# Patient Record
Sex: Female | Born: 1955 | Race: Black or African American | Hispanic: No | Smoking: Former smoker
Health system: Southern US, Community
[De-identification: ages and names within clinical notes are randomized; demographics above are authoritative.]

## PROBLEM LIST (undated history)

## (undated) DIAGNOSIS — G4730 Sleep apnea, unspecified: Secondary | ICD-10-CM

## (undated) DIAGNOSIS — F419 Anxiety disorder, unspecified: Secondary | ICD-10-CM

## (undated) DIAGNOSIS — J45909 Unspecified asthma, uncomplicated: Secondary | ICD-10-CM

## (undated) DIAGNOSIS — M1990 Unspecified osteoarthritis, unspecified site: Secondary | ICD-10-CM

## (undated) DIAGNOSIS — T148XXA Other injury of unspecified body region, initial encounter: Secondary | ICD-10-CM

## (undated) DIAGNOSIS — K219 Gastro-esophageal reflux disease without esophagitis: Secondary | ICD-10-CM

## (undated) DIAGNOSIS — J449 Chronic obstructive pulmonary disease, unspecified: Secondary | ICD-10-CM

## (undated) DIAGNOSIS — I10 Essential (primary) hypertension: Secondary | ICD-10-CM

## (undated) HISTORY — PX: LAPAROSCOPIC ROUX-EN-Y GASTRIC BYPASS WITH UPPER ENDOSCOPY AND REMOVAL OF LAP BAND: SHX6505

## (undated) HISTORY — DX: Chronic obstructive pulmonary disease, unspecified: J44.9

## (undated) HISTORY — PX: COLONOSCOPY W/ BIOPSIES AND POLYPECTOMY: SHX1376

## (undated) HISTORY — PX: DILATION AND CURETTAGE OF UTERUS: SHX78

## (undated) HISTORY — PX: TUBAL LIGATION: SHX77

## (undated) HISTORY — PX: BACK SURGERY: SHX140

---

## 2015-01-18 DIAGNOSIS — Z9889 Other specified postprocedural states: Secondary | ICD-10-CM | POA: Insufficient documentation

## 2015-01-18 DIAGNOSIS — E669 Obesity, unspecified: Secondary | ICD-10-CM | POA: Insufficient documentation

## 2015-01-18 DIAGNOSIS — I10 Essential (primary) hypertension: Secondary | ICD-10-CM | POA: Insufficient documentation

## 2015-01-18 HISTORY — DX: Obesity, unspecified: E66.9

## 2015-01-18 HISTORY — DX: Other specified postprocedural states: Z98.890

## 2015-06-05 DIAGNOSIS — Z860100 Personal history of colon polyps, unspecified: Secondary | ICD-10-CM | POA: Insufficient documentation

## 2015-06-05 DIAGNOSIS — R1110 Vomiting, unspecified: Secondary | ICD-10-CM | POA: Insufficient documentation

## 2015-06-16 HISTORY — PX: BREAST BIOPSY: SHX20

## 2017-05-20 DIAGNOSIS — K529 Noninfective gastroenteritis and colitis, unspecified: Secondary | ICD-10-CM

## 2017-05-20 DIAGNOSIS — K5100 Ulcerative (chronic) pancolitis without complications: Secondary | ICD-10-CM | POA: Insufficient documentation

## 2017-05-20 HISTORY — DX: Noninfective gastroenteritis and colitis, unspecified: K52.9

## 2017-07-11 DIAGNOSIS — J45901 Unspecified asthma with (acute) exacerbation: Secondary | ICD-10-CM | POA: Insufficient documentation

## 2017-10-09 ENCOUNTER — Other Ambulatory Visit: Payer: Self-pay

## 2017-10-09 ENCOUNTER — Emergency Department (HOSPITAL_COMMUNITY): Payer: Self-pay

## 2017-10-09 ENCOUNTER — Emergency Department (HOSPITAL_COMMUNITY)
Admission: EM | Admit: 2017-10-09 | Discharge: 2017-10-09 | Disposition: A | Discharge status: Home / Self Care | Payer: Self-pay | Attending: Emergency Medicine | Admitting: Emergency Medicine

## 2017-10-09 ENCOUNTER — Encounter (HOSPITAL_COMMUNITY): Payer: Self-pay | Admitting: *Deleted

## 2017-10-09 DIAGNOSIS — J45901 Unspecified asthma with (acute) exacerbation: Secondary | ICD-10-CM | POA: Insufficient documentation

## 2017-10-09 DIAGNOSIS — I10 Essential (primary) hypertension: Secondary | ICD-10-CM | POA: Insufficient documentation

## 2017-10-09 HISTORY — DX: Unspecified asthma, uncomplicated: J45.909

## 2017-10-09 HISTORY — DX: Essential (primary) hypertension: I10

## 2017-10-09 LAB — CBC
HCT: 38 % (ref 36.0–46.0)
Hemoglobin: 12 g/dL (ref 12.0–15.0)
MCH: 26.1 pg (ref 26.0–34.0)
MCHC: 31.6 g/dL (ref 30.0–36.0)
MCV: 82.6 fL (ref 78.0–100.0)
PLATELETS: 326 10*3/uL (ref 150–400)
RBC: 4.6 MIL/uL (ref 3.87–5.11)
RDW: 14.7 % (ref 11.5–15.5)
WBC: 8.6 10*3/uL (ref 4.0–10.5)

## 2017-10-09 LAB — BASIC METABOLIC PANEL
ANION GAP: 9 (ref 5–15)
BUN: 16 mg/dL (ref 6–20)
CALCIUM: 9.2 mg/dL (ref 8.9–10.3)
CHLORIDE: 109 mmol/L (ref 101–111)
CO2: 21 mmol/L — ABNORMAL LOW (ref 22–32)
CREATININE: 0.63 mg/dL (ref 0.44–1.00)
GFR calc non Af Amer: >60 mL/min (ref >60)
Glucose, Bld: 121 mg/dL — ABNORMAL HIGH (ref 65–99)
Potassium: 3.5 mmol/L (ref 3.5–5.1)
SODIUM: 139 mmol/L (ref 135–145)

## 2017-10-09 LAB — TROPONIN I: Troponin I: <0.03 ng/mL (ref <0.03)

## 2017-10-09 MED ORDER — PREDNISONE 20 MG PO TABS
60.0000 mg | ORAL_TABLET | Freq: Once | ORAL | Status: AC
  Administered 2017-10-09: 60 mg via ORAL
  Filled 2017-10-09: qty 3

## 2017-10-09 MED ORDER — DOXYCYCLINE HYCLATE 100 MG PO CAPS: 100.0000 mg | ORAL_CAPSULE | Freq: Two times a day (BID) | ORAL | 0 refills | Status: DC

## 2017-10-09 MED ORDER — ALBUTEROL SULFATE (2.5 MG/3ML) 0.083% IN NEBU
2.5000 mg | INHALATION_SOLUTION | Freq: Once | RESPIRATORY_TRACT | Status: AC
  Administered 2017-10-09: 2.5 mg via RESPIRATORY_TRACT
  Filled 2017-10-09: qty 3

## 2017-10-09 MED ORDER — IPRATROPIUM-ALBUTEROL 0.5-2.5 (3) MG/3ML IN SOLN
3.0000 mL | Freq: Once | RESPIRATORY_TRACT | Status: AC
  Administered 2017-10-09: 3 mL via RESPIRATORY_TRACT
  Filled 2017-10-09: qty 3

## 2017-10-09 MED ORDER — PREDNISONE 50 MG PO TABS: 50.0000 mg | ORAL_TABLET | Freq: Every day | ORAL | 0 refills | Status: AC

## 2017-10-09 MED ORDER — IPRATROPIUM BROMIDE 0.02 % IN SOLN
0.5000 mg | Freq: Once | RESPIRATORY_TRACT | Status: AC
  Administered 2017-10-09: 0.5 mg via RESPIRATORY_TRACT
  Filled 2017-10-09: qty 2.5

## 2017-10-09 NOTE — ED Triage Notes (Signed)
The pt is c/o difficulty breathing all day  Worse tonight after she took 4 hhn at the house it has not helped.  She does not live here she is just visiting some nausea and dizziness

## 2017-10-09 NOTE — ED Provider Notes (Signed)
[facility] Provider Note   CSN: [id number] Arrival date & time: 10/09/17  0353     History   Chief Complaint Chief Complaint  Patient presents with  . Shortness of Breath    HPI Patty Howard is a 62 y.o. female with a hx of asthma and hypertension who presents to the ED for intermittent dyspnea and cough over the past 1 week which was worse this evening. Patient states she has had a productive cough with clear to white colored sputum. She states she has had intermittent dyspnea with wheezing as well. She feels she has been decreasing her usual activity level due to these symptoms. She has also had some pain to the left mid back as well that seems to be aggravated with coughing- does not recall an injury to this area. She relays that this morning she woke up at 0100 with dyspnea/wheezing/chest tightness and chest congestion. She feels this was worse than previous over the past 1 week. She tried 4 puffs of her inhaler and utilized her nebulizer machine x 5, took a mucinex and did not seem to have much relief in her symptoms prompting her ER visit. No other specific alleviating/aggravating factors. Reports associated subjective fever last night.  She states this feel similar to previous problems with her asthma that have improved with steroids and abx. She has required admissions for asthma in the past, but does not feel that she is at that point.  Denies chest pain, leg pain/swelling, hemoptysis, recent surgery/trauma, recent long travel, hormone use, personal hx of cancer, or hx of DVT/PE. Triage note reports nausea and dizziness- patient denies each of these symptoms to me. Patient states she has some wheezing at baseline.    HPI  Past Medical History:  Diagnosis Date  . Asthma   . Hypertension     There are no active problems to display for this patient.   History reviewed. No pertinent surgical history.   OB History   None      Home Medications    Prior to Admission medications   Not on File    Family History History reviewed. No pertinent family history.  Social History Social History   Tobacco Use  . Smoking status: Never Smoker  . Smokeless tobacco: Never Used  Substance Use Topics  . Alcohol use: Not on file  . Drug use: Not on file     Allergies   Patient has no known allergies.   Review of Systems Review of Systems  Constitutional: Positive for fever. Negative for chills.  HENT: Negative for ear pain and sore throat.   Respiratory: Positive for cough, chest tightness and shortness of breath.   Cardiovascular: Negative for chest pain, palpitations and leg swelling.  Gastrointestinal: Negative for abdominal pain, diarrhea, nausea and vomiting.  Musculoskeletal: Positive for back pain (left mid posterior rib area). Negative for myalgias (legs).  Neurological: Negative for dizziness and syncope.  All other systems reviewed and are negative.    Physical Exam Updated Vital Signs BP 133/74   Pulse 100   Resp 17   Ht 5' 10"  (1.778 m)   Wt 78.9 kg (174 lb)   SpO2 98%   BMI 24.97 kg/m   Physical Exam  Constitutional: She appears well-developed and well-nourished.  Non-toxic appearance. No distress.  HENT:  Head: Normocephalic and atraumatic.  Right Ear: Tympanic membrane is not perforated, not erythematous, not retracted and not bulging.  Left Ear: Tympanic membrane is not perforated,  not erythematous, not retracted and not bulging.  Nose: Mucosal edema present.  Mouth/Throat: Uvula is midline and oropharynx is clear and moist. No oropharyngeal exudate or posterior oropharyngeal erythema.  Eyes: Pupils are equal, round, and reactive to light. Conjunctivae are normal. Right eye exhibits no discharge. Left eye exhibits no discharge.  Neck: Normal range of motion. Neck supple.  Cardiovascular: Normal rate and regular rhythm.  No murmur heard. Pulmonary/Chest: Effort normal. No  respiratory distress. She has wheezes (end expiratory throughout). She has no rales. She exhibits tenderness (Left mid posterolateral ribs). She exhibits no crepitus, no edema, no deformity, no swelling and no retraction.  No overlying ecchymosis.  Abdominal: Soft. She exhibits no distension. There is no tenderness.  Musculoskeletal:       Right lower leg: She exhibits no tenderness and no edema.       Left lower leg: She exhibits no tenderness and no edema.  Lymphadenopathy:    She has no cervical adenopathy.  Neurological: She is alert.  Skin: Skin is warm and dry. No rash noted.  Psychiatric: She has a normal mood and affect. Her behavior is normal.  Nursing note and vitals reviewed.    ED Treatments / Results  Labs Results for orders placed or performed during the hospital encounter of 64/15/83  Basic metabolic panel  Result Value Ref Range   Sodium 139 135 - 145 mmol/L   Potassium 3.5 3.5 - 5.1 mmol/L   Chloride 109 101 - 111 mmol/L   CO2 21 (L) 22 - 32 mmol/L   Glucose, Bld 121 (H) 65 - 99 mg/dL   BUN 16 6 - 20 mg/dL   Creatinine, Ser 0.63 0.44 - 1.00 mg/dL   Calcium 9.2 8.9 - 10.3 mg/dL   GFR calc non Af Amer >60 >60 mL/min   GFR calc Af Amer >60 >60 mL/min   Anion gap 9 5 - 15  CBC  Result Value Ref Range   WBC 8.6 4.0 - 10.5 K/uL   RBC 4.60 3.87 - 5.11 MIL/uL   Hemoglobin 12.0 12.0 - 15.0 g/dL   HCT 38.0 36.0 - 46.0 %   MCV 82.6 78.0 - 100.0 fL   MCH 26.1 26.0 - 34.0 pg   MCHC 31.6 30.0 - 36.0 g/dL   RDW 14.7 11.5 - 15.5 %   Platelets 326 150 - 400 K/uL  Troponin I  Result Value Ref Range   Troponin I <0.03 <0.03 ng/mL   Dg Chest 2 View  Result Date: 10/09/2017 CLINICAL DATA:  Initial evaluation for acute shortness of breath. History of asthma. EXAM: CHEST - 2 VIEW COMPARISON:  None. FINDINGS: Cardiac and mediastinal silhouettes within normal limits. Aortic atherosclerosis. Lungs somewhat hyperinflated with flattening of the hemidiaphragms. Diffuse  peribronchial thickening, likely related history of asthma, although bronchiolitis could also have this appearance. No focal infiltrates. No pulmonary edema or pleural effusion. No pneumothorax. Age-indeterminate fractures involving the left fourth and sixth ribs noted. No other acute osseous abnormality. IMPRESSION: 1. Scattered diffuse peribronchial thickening, likely related to history of asthma, although acute bronchiolitis could also have this appearance. No focal infiltrates to suggest pneumonia. 2. Age indeterminate fractures of the left posterior fourth and sixth ribs, favored to be chronic. Correlation with physical exam and history recommended. Electronically Signed   By: Jeannine Boga M.D.   On: 10/09/2017 05:32   EKG EKG Interpretation  Date/Time:  Saturday October 09 2017 03:57:06 EDT Ventricular Rate:  112 PR Interval:  130 QRS Duration: 86  QT Interval:  354 QTC Calculation: 483 R Axis:   64 Text Interpretation:  Sinus tachycardia Nonspecific ST and T wave abnormality Abnormal ECG No old tracing to compare Confirmed by Delora Fuel ([id number]) on 10/09/2017 6:24:40 AM   Radiology Dg Chest 2 View  Result Date: 10/09/2017 CLINICAL DATA:  Initial evaluation for acute shortness of breath. History of asthma. EXAM: CHEST - 2 VIEW COMPARISON:  None. FINDINGS: Cardiac and mediastinal silhouettes within normal limits. Aortic atherosclerosis. Lungs somewhat hyperinflated with flattening of the hemidiaphragms. Diffuse peribronchial thickening, likely related history of asthma, although bronchiolitis could also have this appearance. No focal infiltrates. No pulmonary edema or pleural effusion. No pneumothorax. Age-indeterminate fractures involving the left fourth and sixth ribs noted. No other acute osseous abnormality. IMPRESSION: 1. Scattered diffuse peribronchial thickening, likely related to history of asthma, although acute bronchiolitis could also have this appearance. No focal infiltrates  to suggest pneumonia. 2. Age indeterminate fractures of the left posterior fourth and sixth ribs, favored to be chronic. Correlation with physical exam and history recommended. Electronically Signed   By: Jeannine Boga M.D.   On: 10/09/2017 05:32    Procedures Procedures (including critical care time)  Medications Ordered in ED Medications  albuterol (PROVENTIL) (2.5 MG/3ML) 0.083% nebulizer solution 2.5 mg (2.5 mg Nebulization Given 10/09/17 0414)  ipratropium (ATROVENT) nebulizer solution 0.5 mg (0.5 mg Nebulization Given 10/09/17 0414)     Initial Impression / Assessment and Plan / ED Course  I have reviewed the triage vital signs and the nursing notes.  Pertinent labs & imaging results that were available during my care of the patient were reviewed by me and considered in my medical decision making (see chart for details).   Patient presents with dyspnea/cough/wheezing which she reports is consistent with previous problems with asthma.  Patient is nontoxic-appearing, no apparent distress, initial vitals notable for mild tachycardia and hypertension-normalized at time of my initial evaluation.  Patient has had multiple breathing treatments at home, suspect prior albuterol may be contributing to her borderline HR. she remains with end expiratory wheezing throughout.  She does not appear to be in respiratory distress.  Work-up per triage reviewed.  No leukocytosis.  No anemia.  Troponin WNL. No significant electrolyte abnormalities. CXR with findings of asthma vs. Acute bronchiolitis, she has left posterior 4th/6th rib fx that are favored to be chronic- she states she has had some pain consistent with this area over the past 1 week with coughing however she has had pain in this area previously with prior ER visits on chart reviewe therefore somewhat favor chronic however difficulty to definitively determine. She does not recall a sharp specific pain or any injuries. Troponin WNL after 1 week  of sxs, >3 hours of acute worsened dyspnea, no obvious ischemia on EKG, per care everywhere chart review she has had similar interpretations in the past, patient is without chest pain, sxs similar to previous asthma, doubt ACS. She is without leg pain/swelling, hemoptysis, recent surgery/trauma, recent long travel, hormone use, personal hx of cancer, or hx of DVT/PE, doubt pulmonary embolism. Given patient remains with end expiratory wheeze will administer additional DuoNeb and 60 mg PO prednisone.   07:35: RE-EVAL: Patient feeling much improved after duoneb and prednisone. She states her chest feels as if it is opened up and she is moving air much better, reports she feels as though she is having her baseline wheezing. She is adamantly requesting to go home. Repeat lung exam with end expiratory wheeze that is  improved. Patient ambulatory with pulse-ox > 95%.   Patient appears hemodynamically stable and safe for DC home. Will prescribe steroid burst and doxycycline for her asthma exacerbation. I discussed results, treatment plan, need for PCP follow-up, and return precautions with the patient. Provided opportunity for questions, patient confirmed understanding and is in agreement with plan.   Findings and plan of care discussed with supervising physician Dr. Roxanne Mins who is in agreement.   Vitals:   10/09/17 0630 10/09/17 0730  BP: 108/72 117/65  Pulse: 99 96  Resp:  15  SpO2: 99% 94%    Final Clinical Impressions(s) / ED Diagnoses   Final diagnoses:  Exacerbation of asthma, unspecified asthma severity, unspecified whether persistent    ED Discharge Orders        Ordered    predniSONE (DELTASONE) 50 MG tablet  Daily     10/09/17 0747    doxycycline (VIBRAMYCIN) 100 MG capsule  2 times daily     10/09/17 0747       Coulter Oldaker, Glynda Jaeger, PA-C 48/54/62 [id number]    Delora Fuel, MD 00/93/81 [id number]

## 2017-10-09 NOTE — Discharge Instructions (Addendum)
You were seen in the emergency department today for an exacerbation of your asthma.  Your chest x-ray showed findings consistent with your asthma.  Your blood work was reassuring here.  You were given steroids and breathing treatments in the emergency department with improvement.  We are giving a prescription for prednisone (steroid that helps with inflammation) and doxycycline (an antibiotic to treat possible infection). Please take all of your antibiotics until finished. You may develop abdominal discomfort or diarrhea from the antibiotic.  You may help offset this with probiotics which you can buy at the store (ask your pharmacist if unable to find) or get probiotics in the form of eating yogurt. Do not eat or take the probiotics until 2 hours after your antibiotic. If you are unable to tolerate these side effects follow-up with your primary care provider or return to the emergency department.   If you begin to experience any blistering, rashes, swelling, or difficulty breathing seek medical care for evaluation of potentially more serious side effects.   Please be aware that this medication may interact with other medications you are taking, please be sure to discuss your medication list with your pharmacist.   Continue to use her inhalers as prescribed.  Follow up with your primary care divider in 2 to 3 days for reevaluation of your symptoms.  Return to the ER for any new or worsening symptoms including but not limited to worsening trouble breathing, chest pain, lightheadedness, dizziness, passing out, fever, or any other concerns.

## 2017-10-09 NOTE — ED Notes (Signed)
Patient verbalized understanding of discharge instructions and denies any further needs or questions at this time. VS stable. Patient ambulatory with steady gait.  

## 2017-10-12 DIAGNOSIS — R0600 Dyspnea, unspecified: Secondary | ICD-10-CM | POA: Insufficient documentation

## 2017-10-12 HISTORY — DX: Dyspnea, unspecified: R06.00

## 2018-04-05 DIAGNOSIS — J441 Chronic obstructive pulmonary disease with (acute) exacerbation: Secondary | ICD-10-CM | POA: Insufficient documentation

## 2018-05-29 DIAGNOSIS — J45909 Unspecified asthma, uncomplicated: Secondary | ICD-10-CM | POA: Insufficient documentation

## 2018-10-15 ENCOUNTER — Emergency Department (HOSPITAL_COMMUNITY)
Admission: EM | Admit: 2018-10-15 | Discharge: 2018-10-15 | Disposition: A | Discharge status: Home / Self Care | Payer: Medicare Other | Attending: Emergency Medicine | Admitting: Emergency Medicine

## 2018-10-15 ENCOUNTER — Other Ambulatory Visit: Payer: Self-pay

## 2018-10-15 ENCOUNTER — Encounter (HOSPITAL_COMMUNITY): Payer: Self-pay

## 2018-10-15 ENCOUNTER — Emergency Department (HOSPITAL_COMMUNITY): Payer: Medicare Other

## 2018-10-15 DIAGNOSIS — R0602 Shortness of breath: Secondary | ICD-10-CM | POA: Diagnosis present

## 2018-10-15 DIAGNOSIS — I10 Essential (primary) hypertension: Secondary | ICD-10-CM | POA: Insufficient documentation

## 2018-10-15 DIAGNOSIS — Z79899 Other long term (current) drug therapy: Secondary | ICD-10-CM | POA: Insufficient documentation

## 2018-10-15 DIAGNOSIS — J4540 Moderate persistent asthma, uncomplicated: Secondary | ICD-10-CM

## 2018-10-15 LAB — CBC WITH DIFFERENTIAL/PLATELET
Abs Immature Granulocytes: 0.02 10*3/uL (ref 0.00–0.07)
Basophils Absolute: 0 10*3/uL (ref 0.0–0.1)
Basophils Relative: 0 %
Eosinophils Absolute: 0.2 10*3/uL (ref 0.0–0.5)
Eosinophils Relative: 3 %
HCT: 37.1 % (ref 36.0–46.0)
Hemoglobin: 11.1 g/dL — ABNORMAL LOW (ref 12.0–15.0)
Immature Granulocytes: 0 %
Lymphocytes Relative: 35 %
Lymphs Abs: 2.5 10*3/uL (ref 0.7–4.0)
MCH: 26 pg (ref 26.0–34.0)
MCHC: 29.9 g/dL — ABNORMAL LOW (ref 30.0–36.0)
MCV: 86.9 fL (ref 80.0–100.0)
Monocytes Absolute: 0.4 10*3/uL (ref 0.1–1.0)
Monocytes Relative: 5 %
Neutro Abs: 4.1 10*3/uL (ref 1.7–7.7)
Neutrophils Relative %: 57 %
Platelets: 282 10*3/uL (ref 150–400)
RBC: 4.27 MIL/uL (ref 3.87–5.11)
RDW: 14.1 % (ref 11.5–15.5)
WBC: 7.2 10*3/uL (ref 4.0–10.5)
nRBC: 0 % (ref 0.0–0.2)

## 2018-10-15 LAB — URINALYSIS, ROUTINE W REFLEX MICROSCOPIC
Bilirubin Urine: NEGATIVE
Glucose, UA: NEGATIVE mg/dL
Hgb urine dipstick: NEGATIVE
Ketones, ur: 5 mg/dL — AB
Nitrite: NEGATIVE
Protein, ur: NEGATIVE mg/dL
Specific Gravity, Urine: 1.028 (ref 1.005–1.030)
pH: 5 (ref 5.0–8.0)

## 2018-10-15 LAB — COMPREHENSIVE METABOLIC PANEL
ALT: 29 U/L (ref 0–44)
AST: 20 U/L (ref 15–41)
Albumin: 4.2 g/dL (ref 3.5–5.0)
Alkaline Phosphatase: 96 U/L (ref 38–126)
Anion gap: 9 (ref 5–15)
BUN: 18 mg/dL (ref 8–23)
CO2: 25 mmol/L (ref 22–32)
Calcium: 9.6 mg/dL (ref 8.9–10.3)
Chloride: 108 mmol/L (ref 98–111)
Creatinine, Ser: 0.72 mg/dL (ref 0.44–1.00)
GFR calc Af Amer: >60 mL/min (ref >60)
GFR calc non Af Amer: >60 mL/min (ref >60)
Glucose, Bld: 95 mg/dL (ref 70–99)
Potassium: 4.1 mmol/L (ref 3.5–5.1)
Sodium: 142 mmol/L (ref 135–145)
Total Bilirubin: 0.6 mg/dL (ref 0.3–1.2)
Total Protein: 7.3 g/dL (ref 6.5–8.1)

## 2018-10-15 LAB — I-STAT TROPONIN, ED: Troponin i, poc: 0.02 ng/mL (ref 0.00–0.08)

## 2018-10-15 LAB — BRAIN NATRIURETIC PEPTIDE: B Natriuretic Peptide: 38.1 pg/mL (ref 0.0–100.0)

## 2018-10-15 MED ORDER — PREDNISONE 20 MG PO TABS
60.0000 mg | ORAL_TABLET | Freq: Once | ORAL | Status: AC
  Administered 2018-10-15: 60 mg via ORAL
  Filled 2018-10-15: qty 3

## 2018-10-15 MED ORDER — ALBUTEROL SULFATE HFA 108 (90 BASE) MCG/ACT IN AERS
4.0000 | INHALATION_SPRAY | Freq: Once | RESPIRATORY_TRACT | Status: AC
  Administered 2018-10-15: 4 via RESPIRATORY_TRACT
  Filled 2018-10-15: qty 6.7

## 2018-10-15 MED ORDER — IPRATROPIUM-ALBUTEROL 0.5-2.5 (3) MG/3ML IN SOLN: 5.0000 mL | Freq: Once | RESPIRATORY_TRACT | Status: DC

## 2018-10-15 MED ORDER — ALBUTEROL SULFATE HFA 108 (90 BASE) MCG/ACT IN AERS
4.0000 | INHALATION_SPRAY | Freq: Once | RESPIRATORY_TRACT | Status: AC
Start: 2018-10-15 — End: 2018-10-15
  Administered 2018-10-15: 4 via RESPIRATORY_TRACT
  Filled 2018-10-15: qty 6.7

## 2018-10-15 MED ORDER — PREDNISONE 10 MG PO TABS: ORAL_TABLET | ORAL | 0 refills | Status: AC

## 2018-10-15 MED ORDER — IPRATROPIUM BROMIDE HFA 17 MCG/ACT IN AERS: 3.0000 | INHALATION_SPRAY | Freq: Once | RESPIRATORY_TRACT | Status: DC

## 2018-10-15 NOTE — ED Notes (Signed)
Bed: OA55 Expected date:  Expected time:  Means of arrival:  Comments: Female AMS

## 2018-10-15 NOTE — ED Provider Notes (Signed)
63 year old female received at sign out from [place] pending medication, ambulation on pulse ox and reassessment.    "63 y/o female with a PMH Asthma, HTN presents to the ED with a chief complaint of shortness of breath x 3 days. Patient reports a productive cough with yellow to green sputum. Reports her shortness of breath is worse with walking and movement. Patient has used her nebulizer 5 x today which she reports has been a max for her without significant improvement in symptoms. She has also tried her inhaler without improvement.  She reports completing a taper of steroids about a week ago.  Patient reports for the past 2 weeks she has been spending more time at home, has not really gone out shopping.  She does endorse a previous history of smoking cigarettes but reports quitting 3 to 5 years ago. She denies any previous history of blood clots, chest pain, swelling to her legs or weight gain."    Physical Exam  BP (!) 146/83 (BP Location: Right Arm)   Pulse 100   Temp 99.9 F (37.7 C) (Oral)   Resp 18   Ht 5' 10"  (1.778 m)   Wt 131.1 kg   SpO2 99%   BMI 41.47 kg/m   Physical Exam Vitals signs and nursing note reviewed.  Constitutional:      General: She is not in acute distress.    Appearance: She is obese. She is not ill-appearing, toxic-appearing or diaphoretic.     Interventions: She is not intubated. HENT:     Head: Normocephalic.  Eyes:     Conjunctiva/sclera: Conjunctivae normal.  Neck:     Musculoskeletal: Neck supple.  Cardiovascular:     Rate and Rhythm: Normal rate and regular rhythm.     Heart sounds: No murmur. No friction rub. No gallop.   Pulmonary:     Effort: Pulmonary effort is normal. No tachypnea, bradypnea, accessory muscle usage or respiratory distress. She is not intubated.     Breath sounds: Wheezing present. No decreased breath sounds, rhonchi or rales.     Comments: Mild end expiratory wheezes in all fields.  No increased work of breathing or  tachypnea.  Good air movement bilaterally.  No rhonchi or rales. Abdominal:     General: There is no distension.     Palpations: Abdomen is soft.  Skin:    General: Skin is warm.     Findings: No rash.  Neurological:     Mental Status: She is alert.  Psychiatric:        Behavior: Behavior normal.     ED Course/Procedures     Procedures  MDM   63 year old female with a history of asthma and hypertension received a signout from [place] pending another round of albuterol, ambulation pulse ox, and likely discharge to home.  Please see her note for further work-up and medical decision making.  She is hemodynamically stable.  SaO2 is 99%.  She has no tachypnea.  On my exam, she has end expiratory wheezes bilaterally in all fields, but air movement is good.  No increased work of breathing.  She reports that she is feeling much better and would like to be discharged.  I recommended ambulating her on pulse ox, but she states " my oxygen is good and ready to go.  I know when I should return to the ER.  I know you guys are if you need to come back."  She is not in any respiratory distress.  Prednisone  taper was sent to pharmacy by [place].  She is hemodynamically stable and in no acute distress.  Safe discharge home with outpatient follow-up at this time.    Joanne Gavel, PA-C 10/15/18 [id number]    Quintella Reichert, MD 10/17/18 [phone]

## 2018-10-15 NOTE — ED Provider Notes (Signed)
[facility] DEPT Provider Note   CSN: [id number] Arrival date & time: 10/15/18  1927    History   Chief Complaint Chief Complaint  Patient presents with  . Shortness of Breath    HPI Patty Howard is a 63 y.o. female.     63 y/o female with a PMH Asthma, HTN presents to the ED with a chief complaint of shortness of breath x 3 days. Patient reports a productive cough with yellow to green sputum. Reports her shortness of breath is worse with walking and movement. Patient has used her nebulizer 5 x today which she reports has been a max for her without significant improvement in symptoms. She has also tried her inhaler without improvement.  She reports completing a taper of steroids about a week ago.  Patient reports for the past 2 weeks she has been spending more time at home, has not really gone out shopping.  She does endorse a previous history of smoking cigarettes but reports quitting 3 to 5 years ago. She denies any previous history of blood clots, chest pain, swelling to her legs or weight gain.      Past Medical History:  Diagnosis Date  . Asthma   . Hypertension     There are no active problems to display for this patient.   History reviewed. No pertinent surgical history.   OB History   No obstetric history on file.      Home Medications    Prior to Admission medications   Medication Sig Start Date End Date Taking? Authorizing Provider  albuterol (PROVENTIL HFA;VENTOLIN HFA) 108 (90 Base) MCG/ACT inhaler Inhale 1-2 puffs into the lungs every 6 (six) hours as needed for wheezing or shortness of breath.   Yes [provider]  fluticasone furoate-vilanterol (BREO ELLIPTA) 200-25 MCG/INH AEPB Inhale 1 puff into the lungs daily.   Yes [provider]  ipratropium-albuterol (DUONEB) 0.5-2.5 (3) MG/3ML SOLN Take 3 mLs by nebulization every 6 (six) hours as needed (sob and wheezing).   Yes [provider]   losartan-hydrochlorothiazide (HYZAAR) 100-25 MG tablet Take 1 tablet by mouth daily.   Yes [provider]  traMADol (ULTRAM) 50 MG tablet Take 50 mg by mouth every 6 (six) hours as needed for moderate pain.   Yes [provider]  umeclidinium bromide (INCRUSE ELLIPTA) 62.5 MCG/INH AEPB Inhale 1 puff into the lungs daily.   Yes [provider]  zolpidem (AMBIEN) 10 MG tablet Take 10 mg by mouth at bedtime as needed for sleep.   Yes [provider]  doxycycline (VIBRAMYCIN) 100 MG capsule Take 1 capsule (100 mg total) by mouth 2 (two) times daily. Patient not taking: Reported on 10/15/2018 10/09/17   Petrucelli, Glynda Jaeger, PA-C  predniSONE (DELTASONE) 10 MG tablet Take 4 tablets (40 mg total) by mouth daily for 3 days, THEN 3 tablets (30 mg total) daily for 3 days, THEN 2 tablets (20 mg total) daily for 3 days, THEN 1 tablet (10 mg total) daily for 3 days. 10/15/18 10/27/18  Janeece Fitting, PA-C    Family History History reviewed. No pertinent family history.  Social History Social History   Tobacco Use  . Smoking status: Never Smoker  . Smokeless tobacco: Never Used  Substance Use Topics  . Alcohol use: Never    Frequency: Never  . Drug use: Never     Allergies   Banana   Review of Systems Review of Systems  Constitutional: Negative for chills and  fever.  HENT: Negative for ear pain and sore throat.   Eyes: Negative for pain and visual disturbance.  Respiratory: Positive for cough (productive) and shortness of breath.   Cardiovascular: Negative for chest pain and palpitations.  Gastrointestinal: Negative for abdominal pain and vomiting.  Genitourinary: Negative for dysuria and hematuria.  Musculoskeletal: Negative for arthralgias and back pain.  Skin: Negative for color change and rash.  Neurological: Negative for seizures and syncope.  All other systems reviewed and are negative.    Physical Exam Updated Vital Signs BP (!) 146/83 (BP  Location: Right Arm)   Pulse 100   Temp 99.9 F (37.7 C) (Oral)   Resp 18   Ht 5' 10"  (1.778 m)   Wt 131.1 kg   SpO2 99%   BMI 41.47 kg/m   Physical Exam Vitals signs and nursing note reviewed.  Constitutional:      General: She is not in acute distress.    Appearance: She is well-developed.     Comments: Well appearing.   HENT:     Head: Normocephalic and atraumatic.     Mouth/Throat:     Pharynx: No oropharyngeal exudate.  Eyes:     Pupils: Pupils are equal, round, and reactive to light.  Neck:     Musculoskeletal: Normal range of motion.  Cardiovascular:     Rate and Rhythm: Regular rhythm. Tachycardia present.     Heart sounds: Normal heart sounds.  Pulmonary:     Effort: Pulmonary effort is normal. No respiratory distress.     Breath sounds: Examination of the right-upper field reveals decreased breath sounds. Examination of the right-middle field reveals decreased breath sounds. Examination of the right-lower field reveals decreased breath sounds. Examination of the left-lower field reveals decreased breath sounds. Decreased breath sounds and wheezing present. No rhonchi or rales.  Abdominal:     General: Bowel sounds are normal. There is no distension.     Palpations: Abdomen is soft.     Tenderness: There is no abdominal tenderness. There is no right CVA tenderness or left CVA tenderness.  Musculoskeletal:        General: No tenderness or deformity.     Right lower leg: No edema.     Left lower leg: No edema.  Skin:    General: Skin is warm and dry.  Neurological:     Mental Status: She is alert and oriented to person, place, and time.      ED Treatments / Results  Labs (all labs ordered are listed, but only abnormal results are displayed) Labs Reviewed  CBC WITH DIFFERENTIAL/PLATELET - Abnormal; Notable for the following components:      Result Value   Hemoglobin 11.1 (*)    MCHC 29.9 (*)    All other components within normal limits  COMPREHENSIVE  METABOLIC PANEL  BRAIN NATRIURETIC PEPTIDE  URINALYSIS, ROUTINE W REFLEX MICROSCOPIC  I-STAT TROPONIN, ED    EKG EKG Interpretation  Date/Time:  Saturday Oct 15 2018 19:58:07 EDT Ventricular Rate:  101 PR Interval:    QRS Duration: 91 QT Interval:  358 QTC Calculation: 464 R Axis:   74 Text Interpretation:  Sinus tachycardia Probable left atrial enlargement Confirmed by Quintella Reichert [phone]) on 10/15/2018 8:06:55 PM   Radiology Dg Chest Portable 1 View  Result Date: 10/15/2018 CLINICAL DATA:  Shortness of breath for 3 days at home, coughing up yellowish secretions, history asthma, hypertension EXAM: PORTABLE CHEST 1 VIEW COMPARISON:  Portable exam 1931 hours compared to 10/09/2017 FINDINGS:  Upper normal heart size. Mediastinal contours and pulmonary vascularity normal. Lungs clear. No pulmonary infiltrate, pleural effusion or pneumothorax. Bones demineralized. IMPRESSION: No acute abnormalities. Electronically Signed   By: Lavonia Dana M.D.   On: 10/15/2018 20:15    Procedures Procedures (including critical care time)  Medications Ordered in ED Medications  ipratropium (ATROVENT HFA) inhaler 3 puff (has no administration in time range)  albuterol (VENTOLIN HFA) 108 (90 Base) MCG/ACT inhaler 4 puff (has no administration in time range)  predniSONE (DELTASONE) tablet 60 mg (60 mg Oral Given 10/15/18 2115)  albuterol (VENTOLIN HFA) 108 (90 Base) MCG/ACT inhaler 4 puff (4 puffs Inhalation Given 10/15/18 2115)     Initial Impression / Assessment and Plan / ED Course  I have reviewed the triage vital signs and the nursing notes.  Pertinent labs & imaging results that were available during my care of the patient were reviewed by me and considered in my medical decision making (see chart for details).    With a previous medical history of asthma presents the ED for increased shortness of breath, has had no relief with her inhaler, nebulizer at home.  She reports having 5 nebulizing  treatments while at home.  No relieving symptoms.  During evaluation patient has wheezing on auscultation, heart rate elevated reports doing nebulizer treatment prior to arrival.  CBC showed no leukocytosis, hemoglobin is slightly decreased, assistant with patient's previous visits.  CMP showed no electrolyte abnormality, creatinine level is unremarkable.  LFTs are within normal limits.  BNP obtained to further evaluate her shortness of breath, this was negative.  Low suspicion for any CHF.  First troponin was negative, EKG shows some sinus tachycardia but no changes consistent with infarct or STEMI.  Patient has received albuterol while in the ED for puffs, Deltasone 60 mg.  Chest x-ray showed no consolidation, pneumothorax, pleural effusion.  Upon reassessment patient continues to wheeze, she does have persistent asthma will attempt a second round of albuterol along with a troponin to help with her symptoms.  Patient reports completing a steroid taper about a week ago along with antibiotics.  I will discharge patient on a short taper of steroids, we will not be repeating antibiotics due to resistance.  Patient has been educated on her treatment.  According to records patient recently completed a course of doxycycline, her chest x-ray is also clear, do not feel is appropriate for her to help antibiotics at this time, she understands and agrees with this.  10:04 PM Patient pending UA, reevaluation for improvement of wheezing, will sign out to oncoming PA. Plan is for patient to be Dispo home for improvement of symptoms.    Final Clinical Impressions(s) / ED Diagnoses   Final diagnoses:  Moderate persistent asthma without complication    ED Discharge Orders         Ordered    predniSONE (DELTASONE) 10 MG tablet     10/15/18 2202           Janeece Fitting, PA-C 10/15/18 2205    Quintella Reichert, MD 10/17/18 [phone]

## 2018-10-15 NOTE — ED Triage Notes (Signed)
Shortness of breath x 3 days at home and coughing up yellowish secretions no fever noted used inhaler and breathing treatments at home.

## 2018-10-15 NOTE — Discharge Instructions (Addendum)
Your xray today was clear, I have prescribed a steroid taper please take this as directed. If you experience any fever, chest pain, please return to the ED.

## 2018-10-19 ENCOUNTER — Encounter: Payer: Self-pay | Admitting: Family Medicine

## 2018-10-19 ENCOUNTER — Other Ambulatory Visit: Payer: Self-pay

## 2018-10-19 ENCOUNTER — Ambulatory Visit (INDEPENDENT_AMBULATORY_CARE_PROVIDER_SITE_OTHER): Payer: Medicare Other | Admitting: Family Medicine

## 2018-10-19 VITALS — HR 64 | Resp 16

## 2018-10-19 DIAGNOSIS — I10 Essential (primary) hypertension: Secondary | ICD-10-CM

## 2018-10-19 DIAGNOSIS — J449 Chronic obstructive pulmonary disease, unspecified: Secondary | ICD-10-CM | POA: Diagnosis not present

## 2018-10-19 DIAGNOSIS — G4700 Insomnia, unspecified: Secondary | ICD-10-CM | POA: Insufficient documentation

## 2018-10-19 DIAGNOSIS — M25522 Pain in left elbow: Secondary | ICD-10-CM

## 2018-10-19 DIAGNOSIS — G8929 Other chronic pain: Secondary | ICD-10-CM

## 2018-10-19 DIAGNOSIS — R7303 Prediabetes: Secondary | ICD-10-CM | POA: Insufficient documentation

## 2018-10-19 HISTORY — DX: Insomnia, unspecified: G47.00

## 2018-10-19 HISTORY — DX: Other chronic pain: G89.29

## 2018-10-19 MED ORDER — ZOLPIDEM TARTRATE 5 MG PO TABS: 5.0000 mg | ORAL_TABLET | Freq: Every evening | ORAL | 1 refills | Status: DC | PRN

## 2018-10-19 NOTE — Assessment & Plan Note (Signed)
Problems are not well controlled. Complete prednisone taper. No changes in rest of her inhalers. Caution with frequent use of albuterol nebulizations, side effects discussed. Plain Mucinex recommended. Instructed about warning signs. Pulmonology referral placed.

## 2018-10-19 NOTE — Progress Notes (Signed)
Virtual Visit via Video Note   I connected with [name] on 10/19/18 at 10:00 AM EDT by a video enabled telemedicine application and verified that I am speaking with the correct person using two identifiers.  Location patient: home Location provider:home office Persons participating in the virtual visit: patient, provider  I discussed the limitations of evaluation and management by telemedicine and the availability of in person appointments. The patient expressed understanding and agreed to proceed.   HPI: She is a 63 yo AA female ,who just moved to the area from [place], 10/15/18. She has a history of COPD, asthma, hypertension, obesity, anxiety, abnormal mammogram,and insomnia among some. Last CPE 04/2018. Pneumovax in 03/2018. She does not have zoster vaccine, she is not interested in having it. She denies history of DM 2, hyperlipidemia, or hypertension.  Reviewing labs done in 06/2018, A1c was 6.1.  Since 06/16/2018 she has been in the ER 4 times. Last visit on 10/15/2018, diagnosed with asthma exacerbation.  Hypertension:  Dx in her 60's. She is on losartan-HCTZ 100-25 mg daily. She does not check BP regularly. She is taking medications as instructed, no side effects reported.  She has not noted unusual headache, visual changes, exertional chest pain, focal weakness, or edema.  According to patient, she has also followed with cardiologist and reported negative cardiac work-up.  Lab Results  Component Value Date   CREATININE 0.72 10/15/2018   BUN 18 10/15/2018   NA 142 10/15/2018   K 4.1 10/15/2018   CL 108 10/15/2018   CO2 25 10/15/2018   Asthma and COPD: Currently she is on Brio Ellipta 200-25 mcg 1 puff daily, Incruse Ellipta 1 puff daily, albuterol inhaler, and albuterol nebulizer solution. For the past few days she has been using albuterol nebulizer every 4-6 hours. Last asthma/COPD exacerbation on 10/15/2018. Former smoker, quit about 20 years ago.  She was  discharged home on prednisone taper, which she has tolerated well. In general she is feeling better.Still having productive cough with light yellowish sputum. Is still having dyspnea and wheezing, both have improved. She denies chills, fever, unusual fatigue, or body aches. No sick contact.  Hospitalized for COPD/asthma exacerbation. Chest CT showed centrilobular emphysema with unchanged left upper lobe chronic atelectasis and/or scarring.  No evidence of pneumonia or pulmonary edema.  She states that she has a "bad" asthma, she was following with pulmonologist. Negative for nasal congestion, runny nose, or postnasal drainage.  Insomnia: She is requesting a prescription for Ambien 5 mg. She has been taking Ambien for about 5 years, she takes medication as needed. Problem is exacerbated every time she takes prednisone. Sleeps about 5 hours, when she takes prednisone sleeps 8 hours. She feels rested the next day. She denies side effects.  She is also requesting a referral for ortho. 10/2017 she had an elbow fracture,wore 2 casts and still having "a lot" of pain and limitation of movement. According to patient, she was planning on having elbow surgery.  ROS: See pertinent positives and negatives per HPI. COVID-19 screening questions: Denies new sore throat,or possible exposure to COVID-19.Coronavirus testing in 07/2018 negative. Negative for loss in the sense of smell or taste.   Past Medical History:  Diagnosis Date  . Asthma   . COPD (chronic obstructive pulmonary disease) ([place])   . Hypertension     History reviewed. No pertinent surgical history.  Family History  Problem Relation Age of Onset  . Hypertension Mother   . Stroke Mother   . Cancer Father  Social History   Socioeconomic History  . Marital status: Widowed    Spouse name: Not on file  . Number of children: Not on file  . Years of education: Not on file  . Highest education level: Not on file   Occupational History  . Not on file  Social Needs  . Financial resource strain: Not on file  . Food insecurity:    Worry: Not on file    Inability: Not on file  . Transportation needs:    Medical: Not on file    Non-medical: Not on file  Tobacco Use  . Smoking status: Former Smoker    Types: Cigarettes    Last attempt to quit: 06/15/1998    Years since quitting: 20.3  . Smokeless tobacco: Never Used  Substance and Sexual Activity  . Alcohol use: Never    Frequency: Never  . Drug use: Never  . Sexual activity: Not on file  Lifestyle  . Physical activity:    Days per week: Not on file    Minutes per session: Not on file  . Stress: Not on file  Relationships  . Social connections:    Talks on phone: Not on file    Gets together: Not on file    Attends religious service: Not on file    Active member of club or organization: Not on file    Attends meetings of clubs or organizations: Not on file    Relationship status: Not on file  . Intimate partner violence:    Fear of current or ex partner: Not on file    Emotionally abused: Not on file    Physically abused: Not on file    Forced sexual activity: Not on file  Other Topics Concern  . Not on file  Social History Narrative  . Not on file     Current Outpatient Medications:  .  albuterol (PROVENTIL HFA;VENTOLIN HFA) 108 (90 Base) MCG/ACT inhaler, Inhale 1-2 puffs into the lungs every 6 (six) hours as needed for wheezing or shortness of breath., Disp: , Rfl:  .  fluticasone furoate-vilanterol (BREO ELLIPTA) 200-25 MCG/INH AEPB, Inhale 1 puff into the lungs daily., Disp: , Rfl:  .  ipratropium-albuterol (DUONEB) 0.5-2.5 (3) MG/3ML SOLN, Take 3 mLs by nebulization every 6 (six) hours as needed (sob and wheezing)., Disp: , Rfl:  .  losartan-hydrochlorothiazide (HYZAAR) 100-25 MG tablet, Take 1 tablet by mouth daily., Disp: , Rfl:  .  predniSONE (DELTASONE) 10 MG tablet, Take 4 tablets (40 mg total) by mouth daily for 3 days,  THEN 3 tablets (30 mg total) daily for 3 days, THEN 2 tablets (20 mg total) daily for 3 days, THEN 1 tablet (10 mg total) daily for 3 days., Disp: 30 tablet, Rfl: 0 .  umeclidinium bromide (INCRUSE ELLIPTA) 62.5 MCG/INH AEPB, Inhale 1 puff into the lungs daily., Disp: , Rfl:  .  zolpidem (AMBIEN) 5 MG tablet, Take 1 tablet (5 mg total) by mouth at bedtime as needed for sleep., Disp: 20 tablet, Rfl: 1  EXAM:  VITALS per patient if applicable:Pulse 64   Resp 16   GENERAL: alert, oriented, appears well and in no acute distress  HEENT: atraumatic, normocephalic conjunctiva clear, and no obvious facial abnormalities on inspection.   NECK: normal movements of the head and neck  LUNGS: on inspection no signs of respiratory distress, breathing rate appears normal, no obvious gross SOB, or gasping.  Mild wheezing on forced expiration.  CV: no obvious cyanosis  MS:  Left elbow with limitation of extension, movement elicits pain.  I do not appreciate joint edema or erythema.  PSYCH/NEURO: pleasant and cooperative, no obvious depression or anxiety, speech and thought processing grossly intact  ASSESSMENT AND PLAN:  Discussed the following assessment and plan: Orders Placed This Encounter  Procedures  . Ambulatory referral to Pulmonology  . Ambulatory referral to Orthopedic Surgery    Hypertension, essential, benign No changes in current management. Recommend monitoring BP periodically. Continue low-salt diet. Follow-up in 3 to 4 months.  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) ([place]) Problems are not well controlled. Complete prednisone taper. No changes in rest of her inhalers. Caution with frequent use of albuterol nebulizations, side effects discussed. Plain Mucinex recommended. Instructed about warning signs. Pulmonology referral placed.  Prediabetes Healthy lifestyle for primary prevention. We will plan on checking A1c again in 3 to 4 months.  Elbow pain,  chronic, left Appointment with orthopedist will be arranged. Acetaminophen 500 mg 3-4 times per day as needed for pain.  Insomnia We discussed some side effects of Ambien. No changes in Ambien 5 mg at bedtime daily as needed. Good sleep hygiene. Follow-up in 3 to 4 months.    45 min face to face OV. > 50% was dedicated to discussion of Dx, prognosis, treatment options, and some side effects of medications.  I discussed the assessment and treatment plan with the patient.  She was provided an opportunity to ask questions and all were answered. The patient agreed with the plan and demonstrated an understanding of the instructions.     Return in about 3 months (around 01/19/2019).    Senai Kingsley [place], MD

## 2018-10-19 NOTE — Assessment & Plan Note (Signed)
Appointment with orthopedist will be arranged. Acetaminophen 500 mg 3-4 times per day as needed for pain.

## 2018-10-19 NOTE — Assessment & Plan Note (Signed)
No changes in current management. Recommend monitoring BP periodically. Continue low-salt diet. Follow-up in 3 to 4 months.

## 2018-10-19 NOTE — Assessment & Plan Note (Signed)
We discussed some side effects of Ambien. No changes in Ambien 5 mg at bedtime daily as needed. Good sleep hygiene. Follow-up in 3 to 4 months.

## 2018-10-19 NOTE — Assessment & Plan Note (Signed)
Healthy lifestyle for primary prevention. We will plan on checking A1c again in 3 to 4 months.

## 2018-10-27 ENCOUNTER — Other Ambulatory Visit: Payer: Self-pay

## 2018-10-27 ENCOUNTER — Ambulatory Visit (INDEPENDENT_AMBULATORY_CARE_PROVIDER_SITE_OTHER): Payer: BLUE CROSS/BLUE SHIELD

## 2018-10-27 ENCOUNTER — Ambulatory Visit (INDEPENDENT_AMBULATORY_CARE_PROVIDER_SITE_OTHER): Payer: Self-pay

## 2018-10-27 ENCOUNTER — Ambulatory Visit (INDEPENDENT_AMBULATORY_CARE_PROVIDER_SITE_OTHER): Payer: Self-pay | Admitting: Orthopaedic Surgery

## 2018-10-27 ENCOUNTER — Encounter: Payer: Self-pay | Admitting: Orthopaedic Surgery

## 2018-10-27 VITALS — BP 141/87 | HR 107 | Ht 70.0 in | Wt 295.0 lb

## 2018-10-27 DIAGNOSIS — S52122A Displaced fracture of head of left radius, initial encounter for closed fracture: Secondary | ICD-10-CM

## 2018-10-27 DIAGNOSIS — M25561 Pain in right knee: Secondary | ICD-10-CM

## 2018-10-27 DIAGNOSIS — M25522 Pain in left elbow: Secondary | ICD-10-CM

## 2018-10-27 DIAGNOSIS — M1711 Unilateral primary osteoarthritis, right knee: Secondary | ICD-10-CM

## 2018-10-27 DIAGNOSIS — G8929 Other chronic pain: Secondary | ICD-10-CM

## 2018-10-27 MED ORDER — TRAMADOL HCL 50 MG PO TABS: 50.0000 mg | ORAL_TABLET | Freq: Four times a day (QID) | ORAL | 0 refills | Status: DC | PRN

## 2018-10-27 MED ORDER — BUPIVACAINE HCL 0.25 % IJ SOLN
2.0000 mL | INTRAMUSCULAR | Status: AC | PRN
  Administered 2018-10-27: 2 mL via INTRA_ARTICULAR

## 2018-10-27 MED ORDER — LIDOCAINE HCL 1 % IJ SOLN
2.0000 mL | INTRAMUSCULAR | Status: AC | PRN
  Administered 2018-10-27: 2 mL

## 2018-10-27 MED ORDER — METHYLPREDNISOLONE ACETATE 40 MG/ML IJ SUSP
80.0000 mg | INTRAMUSCULAR | Status: AC | PRN
  Administered 2018-10-27: 80 mg via INTRA_ARTICULAR

## 2018-10-27 NOTE — Progress Notes (Signed)
Office Visit Note   Patient: Patty Howard           Date of Birth: Jan 07, 1956           MRN: [id number] Visit Date: 10/27/2018              Requested by: Martinique, Betty G, [place],  [id number] PCP: Patient, No Pcp Per   Assessment & Plan: Visit Diagnoses:  1. Closed displaced fracture of head of left radius, initial encounter   2. Unilateral primary osteoarthritis, right knee   3. Pain in left elbow   4. Chronic pain of right knee     Plan:  #1: MRI scan of the left elbow to evaluate the chondral surfaces as well as the fracture displacement of the radial head.  Will be helpful with preoperative planning. #2: Corticosteroid injection to the right knee will be performed. #3: Follow back up after MRI scan #4: Sling to left arm for comfort  Follow-Up Instructions: Return in about 2 weeks (around 11/10/2018) for review of mri.   Orders:  Orders Placed This Encounter  Procedures  . XR KNEE 3 VIEW RIGHT  . XR Elbow 2 Views Left   No orders of the defined types were placed in this encounter.     Procedures: Large Joint Inj: R knee on 10/27/2018 1:48 PM Indications: pain and diagnostic evaluation Details: 25 G 1.5 in needle, anteromedial approach  Arthrogram: No  Medications: 2 mL lidocaine 1 %; 80 mg methylPREDNISolone acetate 40 MG/ML; 2 mL bupivacaine 0.25 % Aspirate: 2 mL clear and yellow Procedure, treatment alternatives, risks and benefits explained, specific risks discussed. Consent was given by the patient. Immediately prior to procedure a time out was called to verify the correct patient, procedure, equipment, support staff and site/side marked as required. Patient was prepped and draped in the usual sterile fashion.       Clinical Data: No additional findings.   Subjective: Chief Complaint  Patient presents with  . Left Elbow - Pain  . Right Knee - Pain    HPI Patient presents today for left elbow and right knee.  She fell one year ago and fractured her left elbow.  The physician did not want to do an open reduction internal fixation.  She was placed into a cast.  They were hopeful that this would heal.  Also at that time she had injured the right knee.  Pain is centered mainly along the anterior lateral aspect of the knee.  She said that both areas are painful constantly. Her knee is painful along the anterior side. No popping, clicking, swelling, or grinding. Her elbow has limited range of motion and has pain on the posterior side. Her right elbow does swell. She is from [place] just moved here within the last week. She is taking Ibuprofen, but that does not seem to help much.    Review of Systems  Constitutional: Negative for fatigue.  HENT: Negative for ear pain.   Eyes: Negative for pain.  Respiratory: Positive for shortness of breath.   Cardiovascular: Negative for leg swelling.  Gastrointestinal: Negative for constipation and diarrhea.  Endocrine: Negative for cold intolerance and heat intolerance.  Musculoskeletal: Positive for joint swelling.  Skin: Negative for rash.  Allergic/Immunologic: Positive for food allergies.  Neurological: Negative for weakness.  Hematological: Does not bruise/bleed easily.  Psychiatric/Behavioral: Negative for sleep disturbance.     Objective: Vital Signs: BP (!) 141/87   Pulse (!) 107  Ht 5' 10"  (1.778 m)   Wt 295 lb (133.8 kg)   BMI 42.33 kg/m   Physical Exam Constitutional:      Appearance: Normal appearance. She is well-developed.  HENT:     Head: Normocephalic.  Eyes:     Pupils: Pupils are equal, round, and reactive to light.  Pulmonary:     Effort: Pulmonary effort is normal.  Skin:    General: Skin is warm and dry.  Neurological:     Mental Status: She is alert and oriented to person, place, and time.  Psychiatric:        Behavior: Behavior normal.     Ortho Exam   Exam of the left elbow reveals loss of at least 30 degrees of full  extension.  Flexes to about 90 to 95 degrees only.  She is also limited her pronation and supination secondary to pain and mechanical inability.  Wrist joint appears intact.  Tender to palpation more radially than ulnarly.  Her right knee reveals just minimal amount of crepitus with range of motion patellofemoral area.  She is tender to palpation over the lateral joint line and lateral patellofemoral area.  She does have some pseudolaxity with varus stressing.  Pain with palpation over the patellar area and with manipulation of the patella.  Negative McMurray's.  Some lateral joint line pain.  Neuro vas intact distally.   Specialty Comments:  No specialty comments available.  Imaging: Xr Elbow 2 Views Left  Result Date: 10/27/2018 2 view x-ray of the left elbow reveals fracture of the radial head with posterior displacement of a large fragment the radial head about 40-50% of the radial head.  Some degenerative changes at the coronoid.  Xr Knee 3 View Right  Result Date: 10/27/2018 Three-view x-ray of the left knee reveals patellar subluxation laterally on the AP.  Does have spurring of the patella is more suprapatellar.  Sunrise view shows marked joint space narrowing of the lateral patellofemoral joint.  Cystic changes in periosteal spurring is noted laterally also.    PMFS History: Current Outpatient Medications  Medication Sig Dispense Refill  . albuterol (PROVENTIL HFA;VENTOLIN HFA) 108 (90 Base) MCG/ACT inhaler Inhale 1-2 puffs into the lungs every 6 (six) hours as needed for wheezing or shortness of breath.    . fluticasone furoate-vilanterol (BREO ELLIPTA) 200-25 MCG/INH AEPB Inhale 1 puff into the lungs daily.    Marland Kitchen ipratropium-albuterol (DUONEB) 0.5-2.5 (3) MG/3ML SOLN Take 3 mLs by nebulization every 6 (six) hours as needed (sob and wheezing).    Marland Kitchen losartan-hydrochlorothiazide (HYZAAR) 100-25 MG tablet Take 1 tablet by mouth daily.    . predniSONE (DELTASONE) 10 MG tablet Take 4  tablets (40 mg total) by mouth daily for 3 days, THEN 3 tablets (30 mg total) daily for 3 days, THEN 2 tablets (20 mg total) daily for 3 days, THEN 1 tablet (10 mg total) daily for 3 days. 30 tablet 0  . umeclidinium bromide (INCRUSE ELLIPTA) 62.5 MCG/INH AEPB Inhale 1 puff into the lungs daily.    Marland Kitchen zolpidem (AMBIEN) 5 MG tablet Take 1 tablet (5 mg total) by mouth at bedtime as needed for sleep. 20 tablet 1   No current facility-administered medications for this visit.     Patient Active Problem List   Diagnosis Date Noted  . Prediabetes 10/19/2018  . Hypertension, essential, benign 10/19/2018  . 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 Insomnia 10/19/2018  . Elbow pain, chronic, left 10/19/2018  Past Medical History:  Diagnosis Date  . Asthma   . Asthma   . COPD (chronic obstructive pulmonary disease) ([place])   . Hypertension     Family History  Problem Relation Age of Onset  . Hypertension Mother   . Stroke Mother   . Cancer Father     Past Surgical History:  Procedure Laterality Date  . BACK SURGERY     spinal stimulator  . LAPAROSCOPIC ROUX-EN-Y GASTRIC BYPASS WITH UPPER ENDOSCOPY AND REMOVAL OF LAP BAND     Social History   Occupational History  . Not on file  Tobacco Use  . Smoking status: Former Smoker    Types: Cigarettes    Last attempt to quit: 06/15/1998    Years since quitting: 20.3  . Smokeless tobacco: Never Used  Substance and Sexual Activity  . Alcohol use: Never    Frequency: Never  . Drug use: Never  . Sexual activity: Not on file

## 2018-11-07 ENCOUNTER — Emergency Department (HOSPITAL_COMMUNITY)
Admission: EM | Admit: 2018-11-07 | Discharge: 2018-11-07 | Disposition: A | Discharge status: Home / Self Care | Payer: Medicare Other | Attending: Emergency Medicine | Admitting: Emergency Medicine

## 2018-11-07 ENCOUNTER — Emergency Department (HOSPITAL_COMMUNITY): Payer: Medicare Other

## 2018-11-07 ENCOUNTER — Other Ambulatory Visit: Payer: Self-pay

## 2018-11-07 DIAGNOSIS — R0602 Shortness of breath: Secondary | ICD-10-CM

## 2018-11-07 DIAGNOSIS — I10 Essential (primary) hypertension: Secondary | ICD-10-CM | POA: Insufficient documentation

## 2018-11-07 DIAGNOSIS — J449 Chronic obstructive pulmonary disease, unspecified: Secondary | ICD-10-CM | POA: Insufficient documentation

## 2018-11-07 DIAGNOSIS — Z79899 Other long term (current) drug therapy: Secondary | ICD-10-CM | POA: Diagnosis not present

## 2018-11-07 DIAGNOSIS — Z87891 Personal history of nicotine dependence: Secondary | ICD-10-CM | POA: Insufficient documentation

## 2018-11-07 DIAGNOSIS — Z9884 Bariatric surgery status: Secondary | ICD-10-CM | POA: Diagnosis not present

## 2018-11-07 DIAGNOSIS — J4541 Moderate persistent asthma with (acute) exacerbation: Secondary | ICD-10-CM | POA: Diagnosis not present

## 2018-11-07 LAB — BASIC METABOLIC PANEL
Anion gap: 9 (ref 5–15)
BUN: 20 mg/dL (ref 8–23)
CO2: 28 mmol/L (ref 22–32)
Calcium: 9.1 mg/dL (ref 8.9–10.3)
Chloride: 107 mmol/L (ref 98–111)
Creatinine, Ser: 0.78 mg/dL (ref 0.44–1.00)
GFR calc Af Amer: >60 mL/min (ref >60)
GFR calc non Af Amer: >60 mL/min (ref >60)
Glucose, Bld: 120 mg/dL — ABNORMAL HIGH (ref 70–99)
Potassium: 3.6 mmol/L (ref 3.5–5.1)
Sodium: 144 mmol/L (ref 135–145)

## 2018-11-07 LAB — CBC WITH DIFFERENTIAL/PLATELET
Abs Immature Granulocytes: 0.03 10*3/uL (ref 0.00–0.07)
Basophils Absolute: 0 10*3/uL (ref 0.0–0.1)
Basophils Relative: 0 %
Eosinophils Absolute: 0.1 10*3/uL (ref 0.0–0.5)
Eosinophils Relative: 2 %
HCT: 33.1 % — ABNORMAL LOW (ref 36.0–46.0)
Hemoglobin: 10.3 g/dL — ABNORMAL LOW (ref 12.0–15.0)
Immature Granulocytes: 0 %
Lymphocytes Relative: 29 %
Lymphs Abs: 2.7 10*3/uL (ref 0.7–4.0)
MCH: 26.5 pg (ref 26.0–34.0)
MCHC: 31.1 g/dL (ref 30.0–36.0)
MCV: 85.1 fL (ref 80.0–100.0)
Monocytes Absolute: 0.5 10*3/uL (ref 0.1–1.0)
Monocytes Relative: 5 %
Neutro Abs: 6 10*3/uL (ref 1.7–7.7)
Neutrophils Relative %: 64 %
Platelets: 305 10*3/uL (ref 150–400)
RBC: 3.89 MIL/uL (ref 3.87–5.11)
RDW: 14.7 % (ref 11.5–15.5)
WBC: 9.4 10*3/uL (ref 4.0–10.5)
nRBC: 0 % (ref 0.0–0.2)

## 2018-11-07 LAB — BRAIN NATRIURETIC PEPTIDE: B Natriuretic Peptide: 29.2 pg/mL (ref 0.0–100.0)

## 2018-11-07 MED ORDER — PREDNISONE 20 MG PO TABS
60.0000 mg | ORAL_TABLET | Freq: Once | ORAL | Status: AC
  Administered 2018-11-07: 60 mg via ORAL
  Filled 2018-11-07: qty 3

## 2018-11-07 MED ORDER — PREDNISONE 10 MG PO TABS: 40.0000 mg | ORAL_TABLET | Freq: Every day | ORAL | 0 refills | Status: AC

## 2018-11-07 MED ORDER — IPRATROPIUM BROMIDE HFA 17 MCG/ACT IN AERS
3.0000 | INHALATION_SPRAY | Freq: Once | RESPIRATORY_TRACT | Status: AC
  Administered 2018-11-07: 06:00:00 3 via RESPIRATORY_TRACT
  Filled 2018-11-07: qty 12.9

## 2018-11-07 MED ORDER — AEROCHAMBER PLUS FLO-VU MEDIUM MISC
1.0000 | Freq: Once | Status: AC
  Administered 2018-11-07: 1
  Filled 2018-11-07: qty 1

## 2018-11-07 MED ORDER — ALBUTEROL SULFATE HFA 108 (90 BASE) MCG/ACT IN AERS
6.0000 | INHALATION_SPRAY | Freq: Once | RESPIRATORY_TRACT | Status: AC
  Administered 2018-11-07: 06:00:00 6 via RESPIRATORY_TRACT
  Filled 2018-11-07: qty 6.7

## 2018-11-07 NOTE — ED Notes (Signed)
E-signature not available, verbalized understanding of DC instructions and prescriptions. Provided pt will PCP referral for follow up care.

## 2018-11-07 NOTE — ED Triage Notes (Signed)
Per pt she has hx of asthma and feels like her nebulizer and inhalers are not working. Say she has phlegm stuck in her throat. Thick and clear mucus. No fevers no chills, no chest pain. Pt said here 2 weeks ago for sam thing

## 2018-11-07 NOTE — Discharge Instructions (Addendum)
Start taking over the counter allergy medicine such as Allegra, Zyrtec, etc. This may help prevent exacerbations related to seasonal allergies.

## 2018-11-07 NOTE — Progress Notes (Signed)
Patient instructed on use of flutter. Demonstrated good technique x10. Instructed patient to use 10 times per hour as able while awake. Nasal cannula  with humidity set up per order.

## 2018-11-07 NOTE — ED Provider Notes (Signed)
[facility] EMERGENCY DEPARTMENT Provider Note  CSN: [id number] Arrival date & time: 11/07/18 0814  Chief Complaint(s) Asthma  HPI Patty Howard is a 63 y.o. female with a history of asthma and COPD who presents to the emergency department with persistent shortness of breath and sputum production.  She reports that she was here several weeks ago and treated for asthma exacerbation.  Since then she has not improved.  Shortness of breath is associated with severe coughing.  She has tried using duo nebs at home every 4-6 hours with minimal relief.  Shortness of breath is exacerbated with exertion. Reports chest pressure with coughing that is nonexertional or radiating. No associated fevers.  No nausea or vomiting.  Denies any other physical complaints.  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 Authorizing Provider  albuterol (PROVENTIL HFA;VENTOLIN HFA) 108 (90 Base) MCG/ACT inhaler Inhale 1-2 puffs into the lungs every 6 (six) hours as needed for wheezing or shortness of breath.    [provider]  fluticasone furoate-vilanterol (BREO ELLIPTA) 200-25 MCG/INH AEPB Inhale 1 puff into the lungs daily.    [provider]  ipratropium-albuterol (DUONEB) 0.5-2.5 (3) MG/3ML SOLN Take 3 mLs by nebulization every 6 (six) hours as needed (sob and wheezing).    [provider]  losartan-hydrochlorothiazide (HYZAAR) 100-25 MG tablet Take 1 tablet by mouth daily.    [provider]  predniSONE (DELTASONE) 10 MG tablet Take 4 tablets (40 mg total) by mouth daily for 5 days. 11/07/18 11/12/18  Fatima Blank, MD  traMADol (ULTRAM) 50 MG tablet Take 1 tablet (50 mg total) by mouth every 6 (six) hours as needed. 10/27/18   Cherylann Ratel, PA-C  umeclidinium bromide (INCRUSE ELLIPTA) 62.5 MCG/INH AEPB Inhale 1 puff into the lungs daily.    [provider]  zolpidem (AMBIEN) 5 MG tablet Take 1 tablet (5 mg total) by mouth at bedtime as needed for sleep. 10/19/18   Martin[place], Betty G, MD                                                                                                                                    Past Surgical History Past Surgical History:  Procedure Laterality Date   BACK SURGERY     spinal stimulator   LAPAROSCOPIC ROUX-EN-Y GASTRIC BYPASS WITH UPPER ENDOSCOPY AND REMOVAL OF LAP BAND     Family History Family History  Problem  Relation Age of Onset   Hypertension Mother    Stroke Mother    Cancer Father     Social History Social History   Tobacco Use   Smoking status: Former Smoker    Types: Cigarettes    Last attempt to quit: 06/15/1998    Years since quitting: 20.4   Smokeless tobacco: Never Used  Substance Use Topics   Alcohol use: Never    Frequency: Never   Drug use: Never   Allergies Banana  Review of Systems Review of Systems All other systems are reviewed and are negative for acute change except as noted in the HPI  Physical Exam Vital Signs  I have reviewed the triage vital signs BP (!) 155/95    Pulse 85    Temp 98.1 F (36.7 C) (Oral)    Resp 18    SpO2 97%   Physical Exam Vitals signs reviewed.  Constitutional:      General: She is not in acute distress.    Appearance: She is well-developed. She is not diaphoretic.  HENT:     Head: Normocephalic and atraumatic.     Nose: Mucosal edema and rhinorrhea present.     Comments: Post nasal drip    Mouth/Throat:     Pharynx: No  oropharyngeal exudate or posterior oropharyngeal erythema.     Tonsils: No tonsillar exudate.  Eyes:     General: No scleral icterus.       Right eye: No discharge.        Left eye: No discharge.     Conjunctiva/sclera: Conjunctivae normal.     Pupils: Pupils are equal, round, and reactive to light.  Neck:     Musculoskeletal: Normal range of motion and neck supple.  Cardiovascular:     Rate and Rhythm: Normal rate and regular rhythm.     Heart sounds: No murmur. No friction rub. No gallop.   Pulmonary:     Effort: Pulmonary effort is normal. No respiratory distress.     Breath sounds: Transmitted upper airway sounds present. No stridor. Examination of the right-upper field reveals rhonchi. Examination of the left-upper field reveals rhonchi. Examination of the right-middle field reveals rhonchi. Examination of the left-middle field reveals rhonchi. Examination of the right-lower field reveals rhonchi. Examination of the left-lower field reveals rhonchi. Wheezing (faint exp wheezing throughout) and rhonchi present. No rales.  Abdominal:     General: There is no distension.     Palpations: Abdomen is soft.     Tenderness: There is no abdominal tenderness.  Musculoskeletal:        General: No tenderness.  Skin:    General: Skin is warm and dry.     Findings: No erythema or rash.  Neurological:     Mental Status: She is alert and oriented to person, place, and time.     ED Results and Treatments Labs (all labs ordered are listed, but only abnormal results are displayed) Labs Reviewed  CBC WITH DIFFERENTIAL/PLATELET - Abnormal; Notable for the following components:      Result Value   Hemoglobin 10.3 (*)    HCT 33.1 (*)    All other components within normal limits  BASIC METABOLIC PANEL - Abnormal; Notable for the following components:   Glucose, Bld 120 (*)    All other components within normal limits  BRAIN NATRIURETIC PEPTIDE  EKG  EKG Interpretation  Date/Time:  Monday Nov 07 2018 03:42:05 EDT Ventricular Rate:  116 PR Interval:    QRS Duration: 99 QT Interval:  372 QTC Calculation: 517 R Axis:   73 Text Interpretation:  Sinus tachycardia Prolonged QT interval Baseline wander in lead(s) I aVR V4 Otherwise no significant change Confirmed by Addison Lank [phone]) on 11/07/2018 3:56:57 AM      Radiology Dg Chest 2 View  Result Date: 11/07/2018 CLINICAL DATA:  Initial evaluation for shortness of breath for 2 weeks. History of asthma, COPD. EXAM: CHEST - 2 VIEW COMPARISON:  Prior radiograph from 10/15/2018. FINDINGS: Mild cardiomegaly, stable. Mediastinal silhouette within normal limits. Aortic atherosclerosis. Lungs hyperinflated with underlying diffuse peribronchial thickening, increased from previous exam, suggesting possible bronchiolitis and/or FLAIR related to reactive airways disease. No consolidative opacity. No pulmonary edema or pleural effusion. No pneumothorax. No acute osseous finding. IMPRESSION: 1. Hyperinflation with diffuse peribronchial thickening, increased from previous, suggesting acute bronchiolitis and/or asthma exacerbation. 2. Probable underlying COPD. 3. Aortic atherosclerosis. Electronically Signed   By: Jeannine Boga M.D.   On: 11/07/2018 04:55   Pertinent labs & imaging results that were available during my care of the patient were reviewed by me and considered in my medical decision making (see chart for details).  Medications Ordered in ED Medications  albuterol (VENTOLIN HFA) 108 (90 Base) MCG/ACT inhaler 6 puff (6 puffs Inhalation Given 11/07/18 0620)  ipratropium (ATROVENT HFA) inhaler 3 puff (3 puffs Inhalation Given 11/07/18 0618)  AeroChamber Plus Flo-Vu Medium MISC 1 each (1 each Other Given 11/07/18 2542)  predniSONE (DELTASONE) tablet 60 mg (60 mg Oral Given 11/07/18 7062)                                                                                                                                     Procedures Procedures  (including critical care time)  Medical Decision Making / ED Course I have reviewed the nursing notes for this encounter and the patient's prior records (if available in EHR or on provided paperwork).    Patient here with asthma exacerbation. AFVSS. Lungs with rhonchi and exp wheezing. No respiratory distress or hypoxia. CXR w/o pna, ptx, or edema. Labs w/o anemia or evidence suggestive of HF.  Treated with humidified O2, flutter valve, albuterol with atrovent inhalers, and prednisone, resulting in significant symptomatic relief. Wheezing resolved.  The patient appears reasonably screened and/or stabilized for discharge and I doubt any other medical condition or other [facility] requiring further screening, evaluation, or treatment in the ED at this time prior to discharge.  The patient is safe for discharge with strict return precautions.   Final Clinical Impression(s) / ED Diagnoses Final diagnoses:  SOB (shortness of breath)  Moderate persistent asthma with exacerbation   Disposition: Discharge  Condition: Good  I have discussed the results, Dx and Tx plan with the patient who expressed understanding and agree(s) with the plan.  Discharge instructions discussed at great length. The patient was given strict return precautions who verbalized understanding of the instructions. No further questions at time of discharge.    ED Discharge Orders         Ordered    predniSONE (DELTASONE) 10 MG tablet  Daily     11/07/18 0641           Follow Up: Primary care provider  Schedule an appointment as soon as possible for a visit        This chart was dictated using voice recognition software.  Despite best efforts to proofread,  errors can occur which can change the documentation meaning.   Fatima Blank, MD 11/07/18 [phone]

## 2018-11-09 ENCOUNTER — Other Ambulatory Visit: Payer: PRIVATE HEALTH INSURANCE

## 2018-11-15 ENCOUNTER — Encounter: Payer: Self-pay | Admitting: Allergy & Immunology

## 2018-11-15 ENCOUNTER — Ambulatory Visit (INDEPENDENT_AMBULATORY_CARE_PROVIDER_SITE_OTHER): Payer: Medicare HMO | Admitting: Allergy & Immunology

## 2018-11-15 ENCOUNTER — Other Ambulatory Visit: Payer: Self-pay

## 2018-11-15 VITALS — BP 142/84 | HR 104 | Temp 97.9°F | Resp 18 | Ht 67.0 in | Wt 296.0 lb

## 2018-11-15 DIAGNOSIS — K219 Gastro-esophageal reflux disease without esophagitis: Secondary | ICD-10-CM | POA: Diagnosis not present

## 2018-11-15 DIAGNOSIS — J4550 Severe persistent asthma, uncomplicated: Secondary | ICD-10-CM

## 2018-11-15 DIAGNOSIS — J310 Chronic rhinitis: Secondary | ICD-10-CM | POA: Diagnosis not present

## 2018-11-15 MED ORDER — IPRATROPIUM-ALBUTEROL 0.5-2.5 (3) MG/3ML IN SOLN: 3.0000 mL | Freq: Four times a day (QID) | RESPIRATORY_TRACT | 1 refills | Status: DC | PRN

## 2018-11-15 MED ORDER — PANTOPRAZOLE SODIUM 40 MG PO TBEC: 40.0000 mg | DELAYED_RELEASE_TABLET | Freq: Every day | ORAL | 5 refills | Status: DC

## 2018-11-15 NOTE — Progress Notes (Signed)
NEW PATIENT  Date of Service/Encounter:  11/15/18  Referring provider: Patient, No Pcp Per   Assessment:   Severe persistent asthma, uncomplicated  Chronic rhinitis  Gastroesophageal reflux disease  Morbid obesity   Patty Howard presents to establish care. We are still piecing together her history, but it seems that she definitely needs a biologic based on her report of 20 rounds of prednisone per calendar year. We are going to get some labs to see if she would qualify for these. We are also going to get some labs to look for serious causes of uncontrolled asthma, including alpha-1 anti-trypsin deficiency and ABPA. We can make more management decisions based on these results. She does have an elevated absolute eosinophil count of 200 one month ago, which would qualify her for an anti-IL5 medication. I believe that Patty Howard is easier to approve for Medicare patients, but I will defer to Patty Howard our Biologics Coordinator to determine this.    Plan/Recommendations:   1. Severe persistent asthma, uncomplicated - Lung testing was around 50% normal today, so we are not going to do any testing at all. - Consider adding on Fasenra (information provided). Patty Howard Consent signed. - Patty Howard will be reaching out to you to discuss this more. - We are going to get some labs to rule out other causes of difficult to control asthma (we will call you in 1-2 weeks with the results of the testing).  - Daily controller medication(s): Incruse one puff once daily and Breo 200/50mg one puff once daily - Prior to physical activity: albuterol 2 puffs 10-15 minutes before physical activity. - Rescue medications: albuterol 4 puffs every 4-6 hours as needed, albuterol nebulizer one vial every 4-6 hours as needed or DuoNeb nebulizer one vial every 4-6 hours as needed - Asthma control goals:  * Full participation in all desired activities (may need albuterol before activity) * Albuterol use two time or  less a week on average (not counting use with activity) * Cough interfering with sleep two time or less a month * Oral steroids no more than once a year * No hospitalizations  2. Chronic rhinitis - We did not do testing today because of your breathing problems. - We are going to get blood work instead. - Finish up the Zyrtec 14monce daily that you have. - Then start Xyzal (levocetirizine) 11m58maily (prescription will be sent in). - Add on Flonase one spray per nostril twice daily to control any postnasal drip.   3. Gastroesophageal reflux disease - Start Protonix 70m43mce daily to control any silent reflux. - We will see how you are doing at the next visit and we can possibly decrease this.  4. Return in about 4 weeks (around 12/13/2018). This can be an in-person, a virtual Webex or a telephone follow up visit.   Subjective:   Patty Howard 62 y34. female presenting today for evaluation of  Chief Complaint  Patient presents with   Asthma    she has had multiple asthma flares over the past 2 weeks. seems to be worsening. seen in ED 2 in the past month.     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  Tailey Top was referred by Patient, No Pcp Per.     Patty Howard is a 63 y.o. female presenting for an evaluation of asthma and alleriges. She moved here a few weeks ago. She followed a son here. She likes the warmer weather compared to [facility] where she lived previously.    Asthma/Respiratory Symptom History: She was diagnosed with asthma when she became an adult around age 50 years. She had bronchitis recurrently as a child. As she got  older, she started to notice that her SOB was diagnosed as asthma. She tells me that she has SOB more often than not. Currently she is on Proventil, Breo (started one year ago),and Incruse (started one year ago). She has a nebulizer at home. She was also on Singular and Advair Diskus at one point. She estimates that she takes prednisone "regularly". She tells me that she is on it "a lot". She did have an absolute eosinophil count of 200 one month ago. She estimates that she is on it "20 times per year".   Allergic Rhinitis Symptom History: She denies sneezing but she does have chronic congestion. She does endorse itchy watery eyes. The weather seems to make it the worst. She does not use any allegy medications at all. She does not get sinus infections at all.  Food Allergy Symptom History: She tells me that she has problems with bananas (hives). She just avoids them. She does not have an EpiPen at all. She otherwise tolerates all of the major food allergens without adverse event. She is interested in getting an EpiPen just in case.   GERD Symptom History: She does have a history of GERD and used an H2 blocker as needed. She does not take anything regularly for her GERD now. She does not think that this is a problem. She has never been on a PPI.   Otherwise, there is no history of other atopic diseases, including drug allergies, stinging insect allergies, eczema, urticaria or contact dermatitis. There is no significant infectious history. Vaccinations are up to date.    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 If you have any questions, ask your nurse or doctor.        albuterol 108 (90 Base) MCG/ACT inhaler Commonly known as:  VENTOLIN HFA Inhale 1-2 puffs into the lungs every 6 (six) hours as needed for wheezing or shortness of breath.   Breo Ellipta 200-25 MCG/INH Aepb Generic drug:  fluticasone furoate-vilanterol Inhale 1 puff into the lungs daily.   Incruse Ellipta 62.5 MCG/INH Aepb Generic drug:  umeclidinium bromide Inhale 1 puff into the lungs daily.   ipratropium 17 MCG/ACT inhaler Commonly known as:  ATROVENT HFA Inhale 3 puffs into the lungs daily.   ipratropium-albuterol 0.5-2.5 (3) MG/3ML Soln Commonly known as:  DUONEB Take 3 mLs by nebulization every 6 (six) hours as needed (sob and wheezing).   losartan-hydrochlorothiazide 100-25 MG tablet Commonly known as:  HYZAAR Take 1 tablet by mouth daily.   pantoprazole 40 MG tablet Commonly known as:  Protonix Take  1 tablet (40 mg total) by mouth daily. Started by:  Valentina Shaggy, MD   traMADol 50 MG tablet Commonly known as:  ULTRAM Take 1 tablet (50 mg total) by mouth every 6 (six) hours as needed.   zolpidem 5 MG tablet Commonly known as:  AMBIEN Take 1 tablet (5 mg total) by mouth at bedtime as needed for sleep.       Birth History: non-contributory  Developmental History: non-contributory  Past Surgical History: Past Surgical History:  Procedure Laterality Date   BACK SURGERY     spinal stimulator   LAPAROSCOPIC ROUX-EN-Y GASTRIC BYPASS WITH UPPER ENDOSCOPY AND REMOVAL OF LAP BAND       Family History: Family History  Problem Relation Age of Onset   Hypertension Mother    Stroke Mother    Cancer Father      Social History: Chavonne lives at home by herself. She lives in a [place] that is 65 years old. There is wood throughout the home. She has electric heating and central cooling. There are no animals inside or outside of the home. There are dust mite coverings on the pillow,  but not the bed. There is no smoking in the home. She is now retired. She did smoke for 20 years, quitting in May 1999.     Review of Systems  Constitutional: Negative.  Negative for chills, fever, malaise/fatigue and weight loss.  HENT: Negative.  Negative for congestion, ear discharge, ear pain and sore throat.   Eyes: Negative for pain, discharge and redness.  Respiratory: Negative for cough, sputum production, shortness of breath and wheezing.   Cardiovascular: Negative.  Negative for chest pain and palpitations.  Gastrointestinal: Negative for abdominal pain, diarrhea, heartburn, nausea and vomiting.  Skin: Negative.  Negative for itching and rash.  Neurological: Negative for dizziness and headaches.  Endo/Heme/Allergies: Negative for environmental allergies. Does not bruise/bleed easily.       Objective:   Blood pressure (!) 142/84, pulse (!) 104, temperature 97.9 F (36.6 C), temperature source Temporal, resp. rate 18, height 5' 7"  (1.702 m), weight 296 lb (134.3 kg), SpO2 97 %. Body mass index is 46.36 kg/m.   Physical Exam:   Physical Exam  Constitutional: She appears well-developed.  Obese female. Personable.   HENT:  Head: Normocephalic and atraumatic.  Right Ear: Tympanic membrane, external ear and ear canal normal. No drainage, swelling or tenderness. Tympanic membrane is not injected, not scarred, not erythematous, not retracted and not bulging.  Left Ear: Tympanic membrane, external ear and ear canal normal. No drainage, swelling or tenderness. Tympanic membrane is not injected, not scarred, not erythematous, not retracted and not bulging.  Nose: Mucosal edema and rhinorrhea present. No nasal deformity or septal deviation. No epistaxis. Right sinus exhibits no maxillary sinus tenderness and no frontal sinus tenderness. Left sinus exhibits no maxillary sinus tenderness and no frontal sinus tenderness.  Mouth/Throat: Uvula is midline and oropharynx is clear and moist.  Mucous membranes are not pale and not dry.  Marked nasal hypertrophy. There is clear rhinorrhea bilaterally.   Eyes: Pupils are equal, round, and reactive to light. Conjunctivae and EOM are normal. Right eye exhibits no chemosis and no discharge. Left eye exhibits no chemosis and no discharge. Right conjunctiva is not injected. Left conjunctiva is not injected.  Cardiovascular: Normal rate, regular rhythm and normal heart sounds.  Respiratory: Effort normal and breath sounds normal. No accessory muscle usage. No tachypnea. No respiratory distress. She has no wheezes. She has no  rhonchi. She has no rales. She exhibits no tenderness.  Decreased air movement at the bases. Inspiratory and expiratory wheezing bilaterally, but breathing comfortably.    GI: There is no abdominal tenderness. There is no rebound and no guarding.  Lymphadenopathy:       Head (right side): No submandibular, no tonsillar and no occipital adenopathy present.       Head (left side): No submandibular, no tonsillar and no occipital adenopathy present.    She has no cervical adenopathy.  Neurological: She is alert.  Skin: No abrasion, no petechiae and no rash noted. Rash is not papular, not vesicular and not urticarial. No erythema. No pallor.  Acanthosis nigricans on the neck.   Psychiatric: She has a normal mood and affect.     Diagnostic studies:  Spirometry: results abnormal (FEV1: 1.25/55%, FVC: 1.47/51%, FEV1/FVC: 85%).    Spirometry consistent with possible restrictive disease.   Allergy Studies: none (labs sent instead)         Salvatore Marvel, MD [facility]

## 2018-11-15 NOTE — Patient Instructions (Addendum)
1. Severe persistent asthma, uncomplicated - Lung testing was around 50% normal today, so we are not going to do any testing at all. - Consider adding on Fasenra (information provided). Berna Bue Consent signed. - Tammy will be reaching out to you to discuss this more. - We are going to get some labs to rule out other causes of difficult to control asthma (we will call you in 1-2 weeks with the results of the testing).  - Daily controller medication(s): Incruse one puff once daily and Breo 200/20mg one puff once daily - Prior to physical activity: albuterol 2 puffs 10-15 minutes before physical activity. - Rescue medications: albuterol 4 puffs every 4-6 hours as needed, albuterol nebulizer one vial every 4-6 hours as needed or DuoNeb nebulizer one vial every 4-6 hours as needed - Asthma control goals:  * Full participation in all desired activities (may need albuterol before activity) * Albuterol use two time or less a week on average (not counting use with activity) * Cough interfering with sleep two time or less a month * Oral steroids no more than once a year * No hospitalizations  2. Chronic rhinitis - We did not do testing today because of your breathing problems. - We are going to get blood work instead. - Finish up the Zyrtec 149monce daily that you have. - Then start Xyzal (levocetirizine) 52m18maily (prescription will be sent in). - Add on Flonase one spray per nostril twice daily to control any postnasal drip.   3. Gastroesophageal reflux disease - Start Protonix 74m19mce daily to control any silent reflux. - We will see how you are doing at the next visit and we can possibly decrease this.  4. Return in about 4 weeks (around 12/13/2018). This can be an in-person, a virtual Webex or a telephone follow up visit.   Please inform us o[place]any Emergency Department visits, hospitalizations, or changes in symptoms. Call us b[place]oreaore going to the ED for breathing or allergy symptoms since  we might be able to fit you in for a sick visit. Feel free to contact us a[place]oreatime with any questions, problems, or concerns.  It was a pleasure to meet you today!  Websites that have reliable patient information: 1. American Academy of Asthma, Allergy, and Immunology: www.aaaai.org 2. Food Allergy Research and Education (FARE): foodallergy.org 3. Mothers of Asthmatics: http://www.asthmacommunitynetwork.org 4. American College of Allergy, Asthma, and Immunology: www.acaai.org  "Like" us [email or web address] and Instagram for our latest updates!      Make sure you are registered to vote! If you have moved or changed any of your contact information, you will need to get this updated before voting!    Voter ID laws are NOT going into effect for the General Election in November 2020! DO NOT let this stop you from exercising your right to vote!

## 2018-11-17 ENCOUNTER — Encounter: Payer: Self-pay | Admitting: *Deleted

## 2018-11-17 ENCOUNTER — Encounter: Payer: Self-pay | Admitting: Allergy & Immunology

## 2018-11-21 LAB — ALPHA-1-ANTITRYPSIN: A-1 Antitrypsin: 114 mg/dL (ref 101–187)

## 2018-11-21 LAB — ALLERGENS W/TOTAL IGE AREA 2
Alternaria Alternata IgE: 2.74 kU/L — AB
Aspergillus Fumigatus IgE: 0.11 kU/L — AB
Bermuda Grass IgE: <0.1 kU/L
Cat Dander IgE: <0.1 kU/L
Cedar, Mountain IgE: <0.1 kU/L
Cladosporium Herbarum IgE: <0.1 kU/L
Cockroach, German IgE: <0.1 kU/L
Common Silver Birch IgE: <0.1 kU/L
Cottonwood IgE: <0.1 kU/L
D Farinae IgE: 1.23 kU/L — AB
D Pteronyssinus IgE: 0.78 kU/L — AB
Dog Dander IgE: <0.1 kU/L
Elm, American IgE: <0.1 kU/L
IgE (Immunoglobulin E), Serum: 40 IU/mL (ref 6–495)
Johnson Grass IgE: <0.1 kU/L
Maple/Box Elder IgE: <0.1 kU/L
Mouse Urine IgE: <0.1 kU/L
Oak, White IgE: <0.1 kU/L
Pecan, Hickory IgE: <0.1 kU/L
Penicillium Chrysogen IgE: <0.1 kU/L
Pigweed, Rough IgE: <0.1 kU/L
Ragweed, Short IgE: <0.1 kU/L
Sheep Sorrel IgE Qn: <0.1 kU/L
Timothy Grass IgE: <0.1 kU/L
White Mulberry IgE: <0.1 kU/L

## 2018-11-21 LAB — ASPERGILLUS PRECIPITINS
A.Fumigatus #1 Abs: NEGATIVE
Aspergillus Flavus Antibodies: NEGATIVE
Aspergillus Niger Antibodies: NEGATIVE

## 2018-11-22 ENCOUNTER — Telehealth: Payer: Self-pay | Admitting: *Deleted

## 2018-11-22 NOTE — Telephone Encounter (Signed)
Called patient and discussed options with patient for Patty Howard and we will move forward with [place]. Patient to come by today with SSI benefit letter and sign application. I advised this will be affordable option for her.

## 2018-12-05 DIAGNOSIS — M545 Low back pain: Secondary | ICD-10-CM | POA: Diagnosis not present

## 2018-12-05 DIAGNOSIS — J45909 Unspecified asthma, uncomplicated: Secondary | ICD-10-CM | POA: Diagnosis not present

## 2018-12-05 DIAGNOSIS — G4700 Insomnia, unspecified: Secondary | ICD-10-CM | POA: Diagnosis not present

## 2018-12-09 ENCOUNTER — Other Ambulatory Visit: Payer: Self-pay

## 2018-12-09 ENCOUNTER — Ambulatory Visit (INDEPENDENT_AMBULATORY_CARE_PROVIDER_SITE_OTHER): Payer: Medicare HMO | Admitting: *Deleted

## 2018-12-09 DIAGNOSIS — J4550 Severe persistent asthma, uncomplicated: Secondary | ICD-10-CM

## 2018-12-09 MED ORDER — EPINEPHRINE 0.3 MG/0.3ML IJ SOAJ: 0.3000 mg | Freq: Once | INTRAMUSCULAR | 1 refills | Status: AC

## 2018-12-09 MED ORDER — BENRALIZUMAB 30 MG/ML ~~LOC~~ SOSY
30.0000 mg | PREFILLED_SYRINGE | Freq: Once | SUBCUTANEOUS | Status: AC
  Administered 2018-12-09: 30 mg via SUBCUTANEOUS

## 2018-12-09 NOTE — Progress Notes (Signed)
Immunotherapy   Patient Details  Name: Zienna Ahlin MRN: [id number] Date of Birth: 04-Feb-1956  12/09/2018  Davis Gourd started injections for  Fasenra Frequency: Every 4 weeks x3 doses, then every 8 weeks.  Epi-Pen: Yes Consent signed and patient instructions given.  Patient received 23m injection of Fasenra in the [place] Patient waited 30 minutes and did not experience any issues.   Ashleigh Fernandez-Vernon 12/09/2018, 2:51 PM

## 2018-12-13 ENCOUNTER — Ambulatory Visit: Payer: PRIVATE HEALTH INSURANCE | Admitting: Allergy & Immunology

## 2018-12-15 ENCOUNTER — Ambulatory Visit: Payer: Medicare HMO | Admitting: Allergy

## 2018-12-15 NOTE — Progress Notes (Deleted)
Follow Up Note  RE: Patty Howard MRN: [id number] DOB: 1956/02/13 Date of Office Visit: 12/15/2018  Referring provider: No ref. provider found Primary care provider: Patient, No Pcp Per  Chief Complaint: No chief complaint on file.  History of Present Illness: I had the pleasure of seeing Patty Howard for a follow up visit at the [facility] [place] [facility] on 12/15/2018. She is a 63 y.o. female, who is being followed for asthma, chronic rhinitis, GERD. Today she is here for regular follow up visit.  Her previous allergy office visit was on 11/15/2018 with Dr. Ernst Bowler.   1. Severe persistent asthma, uncomplicated - Lung testing was around 50% normal today, so we are not going to do any testing at all. - Consider adding on Fasenra (information provided). Patty Howard Consent signed. - Patty Howard will be reaching out to you to discuss this more. - We are going to get some labs to rule out other causes of difficult to control asthma (we will call you in 1-2 weeks with the results of the testing).  - Daily controller medication(s): Incruse one puff once daily and Breo 200/38mg one puff once daily - Prior to physical activity: albuterol 2 puffs 10-15 minutes before physical activity. - Rescue medications: albuterol 4 puffs every 4-6 hours as needed, albuterol nebulizer one vial every 4-6 hours as needed or DuoNeb nebulizer one vial every 4-6 hours as needed - Asthma control goals:  * Full participation in all desired activities (may need albuterol before activity) * Albuterol use two time or less a week on average (not counting use with activity) * Cough interfering with sleep two time or less a month * Oral steroids no more than once a year * No hospitalizations  2. Chronic rhinitis - We did not do testing today because of your breathing problems. - We are going to get blood work instead. - Finish up the Zyrtec 111monce daily that you have. - Then start Xyzal  (levocetirizine) 29m37maily (prescription will be sent in). - Add on Flonase one spray per nostril twice daily to control any postnasal drip.   3. Gastroesophageal reflux disease - Start Protonix 72m34mce daily to control any silent reflux. - We will see how you are doing at the next visit and we can possibly decrease this.  Assessment and Plan: Patty Howard 62 y55. female with: No problem-specific Assessment & Plan notes found for this encounter.  No follow-ups on file.  No orders of the defined types were placed in this encounter.  Lab Orders  No laboratory test(s) ordered today    Diagnostics: Spirometry:  Tracings reviewed. Her effort: {Blank single:19197::"Good reproducible efforts.","It was hard to get consistent efforts and there is a question as to whether this reflects a maximal maneuver.","Poor effort, data can not be interpreted."} FVC: ***L FEV1: ***L, ***% predicted FEV1/FVC ratio: ***% Interpretation: {Blank single:19197::"Spirometry consistent with mild obstructive disease","Spirometry consistent with moderate obstructive disease","Spirometry consistent with severe obstructive disease","Spirometry consistent with possible restrictive disease","Spirometry consistent with mixed obstructive and restrictive disease","Spirometry uninterpretable due to technique","Spirometry consistent with normal pattern","No overt abnormalities noted given today's efforts"}.  Please see scanned spirometry results for details.  Skin Testing: {Blank single:19197::"Select foods","Environmental allergy panel","Environmental allergy panel and select foods","Food allergy panel","None","Deferred due to recent antihistamines use"}. Positive test to: ***. Negative test to: ***.  Results discussed with patient/family.   Medication List:  Current Outpatient Medications  Medication Sig Dispense Refill  . albuterol (PROVENTIL HFA;VENTOLIN HFA) 108 (90 Base)  MCG/ACT inhaler Inhale 1-2 puffs into the  lungs every 6 (six) hours as needed for wheezing or shortness of breath.    . fluticasone furoate-vilanterol (BREO ELLIPTA) 200-25 MCG/INH AEPB Inhale 1 puff into the lungs daily.    Marland Kitchen ipratropium (ATROVENT HFA) 17 MCG/ACT inhaler Inhale 3 puffs into the lungs daily.    Marland Kitchen ipratropium-albuterol (DUONEB) 0.5-2.5 (3) MG/3ML SOLN Take 3 mLs by nebulization every 6 (six) hours as needed (sob and wheezing). 360 mL 1  . losartan-hydrochlorothiazide (HYZAAR) 100-25 MG tablet Take 1 tablet by mouth daily.    . pantoprazole (PROTONIX) 40 MG tablet Take 1 tablet (40 mg total) by mouth daily. 30 tablet 5  . traMADol (ULTRAM) 50 MG tablet Take 1 tablet (50 mg total) by mouth every 6 (six) hours as needed. 30 tablet 0  . umeclidinium bromide (INCRUSE ELLIPTA) 62.5 MCG/INH AEPB Inhale 1 puff into the lungs daily.    Marland Kitchen zolpidem (AMBIEN) 5 MG tablet Take 1 tablet (5 mg total) by mouth at bedtime as needed for sleep. 20 tablet 1   No current facility-administered medications for this visit.    Allergies: Allergies  Allergen Reactions  . Banana Hives and Itching   I reviewed her past medical history, social history, family history, and environmental history and no significant changes have been reported from previous visit on 11/15/2018.  Review of Systems  Constitutional: Negative for appetite change, chills, fever and unexpected weight change.  HENT: Negative for congestion and rhinorrhea.   Eyes: Negative for itching.  Respiratory: Negative for cough, chest tightness, shortness of breath and wheezing.   Gastrointestinal: Negative for abdominal pain.  Skin: Negative for rash.  Neurological: Negative for headaches.   Objective: There were no vitals taken for this visit. There is no height or weight on file to calculate BMI. Physical Exam  Constitutional: She is oriented to person, place, and time. She appears well-developed and well-nourished.  HENT:  Head: Normocephalic and atraumatic.  Right Ear:  External ear normal.  Left Ear: External ear normal.  Nose: Nose normal.  Mouth/Throat: Oropharynx is clear and moist.  Eyes: Conjunctivae and EOM are normal.  Neck: Neck supple.  Cardiovascular: Normal rate, regular rhythm and normal heart sounds. Exam reveals no gallop and no friction rub.  No murmur heard. Pulmonary/Chest: Effort normal and breath sounds normal. She has no wheezes. She has no rales.  Lymphadenopathy:    She has no cervical adenopathy.  Neurological: She is alert and oriented to person, place, and time.  Skin: Skin is warm. No rash noted.  Psychiatric: She has a normal mood and affect. Her behavior is normal.  Nursing note and vitals reviewed.  Previous notes and tests were reviewed. The plan was reviewed with the patient/family, and all questions/concerned were addressed.  It was my pleasure to see Patty Howard today and participate in her care. Please feel free to contact me with any questions or concerns.  Sincerely,  Rexene Alberts, DO Allergy & Immunology  [facility] office: [phone] [facility] office: [phone]

## 2018-12-19 ENCOUNTER — Other Ambulatory Visit: Payer: Self-pay

## 2018-12-19 ENCOUNTER — Emergency Department (HOSPITAL_COMMUNITY): Payer: Medicare Other

## 2018-12-19 ENCOUNTER — Encounter (HOSPITAL_COMMUNITY): Payer: Self-pay

## 2018-12-19 ENCOUNTER — Emergency Department (HOSPITAL_COMMUNITY)
Admission: EM | Admit: 2018-12-19 | Discharge: 2018-12-20 | Disposition: A | Discharge status: Home / Self Care | Payer: Medicare Other | Attending: Emergency Medicine | Admitting: Emergency Medicine

## 2018-12-19 DIAGNOSIS — J069 Acute upper respiratory infection, unspecified: Secondary | ICD-10-CM | POA: Diagnosis not present

## 2018-12-19 DIAGNOSIS — J441 Chronic obstructive pulmonary disease with (acute) exacerbation: Secondary | ICD-10-CM | POA: Insufficient documentation

## 2018-12-19 DIAGNOSIS — I10 Essential (primary) hypertension: Secondary | ICD-10-CM | POA: Diagnosis not present

## 2018-12-19 DIAGNOSIS — Z20828 Contact with and (suspected) exposure to other viral communicable diseases: Secondary | ICD-10-CM | POA: Diagnosis not present

## 2018-12-19 DIAGNOSIS — J45909 Unspecified asthma, uncomplicated: Secondary | ICD-10-CM | POA: Diagnosis not present

## 2018-12-19 DIAGNOSIS — R0602 Shortness of breath: Secondary | ICD-10-CM | POA: Diagnosis present

## 2018-12-19 DIAGNOSIS — Z87891 Personal history of nicotine dependence: Secondary | ICD-10-CM | POA: Diagnosis not present

## 2018-12-19 DIAGNOSIS — Z79899 Other long term (current) drug therapy: Secondary | ICD-10-CM | POA: Diagnosis not present

## 2018-12-19 LAB — COMPREHENSIVE METABOLIC PANEL
ALT: 26 U/L (ref 0–44)
AST: 26 U/L (ref 15–41)
Albumin: 4.2 g/dL (ref 3.5–5.0)
Alkaline Phosphatase: 85 U/L (ref 38–126)
Anion gap: 8 (ref 5–15)
BUN: 12 mg/dL (ref 8–23)
CO2: 26 mmol/L (ref 22–32)
Calcium: 9.6 mg/dL (ref 8.9–10.3)
Chloride: 106 mmol/L (ref 98–111)
Creatinine, Ser: 0.81 mg/dL (ref 0.44–1.00)
GFR calc Af Amer: >60 mL/min (ref >60)
GFR calc non Af Amer: >60 mL/min (ref >60)
Glucose, Bld: 98 mg/dL (ref 70–99)
Potassium: 3.7 mmol/L (ref 3.5–5.1)
Sodium: 140 mmol/L (ref 135–145)
Total Bilirubin: 0.5 mg/dL (ref 0.3–1.2)
Total Protein: 7.1 g/dL (ref 6.5–8.1)

## 2018-12-19 LAB — CBC WITH DIFFERENTIAL/PLATELET
Abs Immature Granulocytes: 0.02 10*3/uL (ref 0.00–0.07)
Basophils Absolute: 0 10*3/uL (ref 0.0–0.1)
Basophils Relative: 0 %
Eosinophils Absolute: 0 10*3/uL (ref 0.0–0.5)
Eosinophils Relative: 0 %
HCT: 39.3 % (ref 36.0–46.0)
Hemoglobin: 12 g/dL (ref 12.0–15.0)
Immature Granulocytes: 0 %
Lymphocytes Relative: 20 %
Lymphs Abs: 1.3 10*3/uL (ref 0.7–4.0)
MCH: 25.9 pg — ABNORMAL LOW (ref 26.0–34.0)
MCHC: 30.5 g/dL (ref 30.0–36.0)
MCV: 84.7 fL (ref 80.0–100.0)
Monocytes Absolute: 0.5 10*3/uL (ref 0.1–1.0)
Monocytes Relative: 7 %
Neutro Abs: 4.6 10*3/uL (ref 1.7–7.7)
Neutrophils Relative %: 73 %
Platelets: 309 10*3/uL (ref 150–400)
RBC: 4.64 MIL/uL (ref 3.87–5.11)
RDW: 14.5 % (ref 11.5–15.5)
WBC: 6.3 10*3/uL (ref 4.0–10.5)
nRBC: 0 % (ref 0.0–0.2)

## 2018-12-19 LAB — SARS CORONAVIRUS 2 BY RT PCR (HOSPITAL ORDER, PERFORMED IN ~~LOC~~ HOSPITAL LAB): SARS Coronavirus 2: NEGATIVE

## 2018-12-19 MED ORDER — ACETAMINOPHEN 500 MG PO TABS
1000.0000 mg | ORAL_TABLET | Freq: Once | ORAL | Status: AC
  Administered 2018-12-19: 1000 mg via ORAL
  Filled 2018-12-19: qty 2

## 2018-12-19 MED ORDER — ALBUTEROL SULFATE HFA 108 (90 BASE) MCG/ACT IN AERS
6.0000 | INHALATION_SPRAY | Freq: Once | RESPIRATORY_TRACT | Status: AC
  Administered 2018-12-19: 6 via RESPIRATORY_TRACT
  Filled 2018-12-19: qty 6.7

## 2018-12-19 MED ORDER — LIDOCAINE VISCOUS HCL 2 % MT SOLN
15.0000 mL | Freq: Once | OROMUCOSAL | Status: AC
  Administered 2018-12-19: 15 mL via OROMUCOSAL
  Filled 2018-12-19: qty 15

## 2018-12-19 MED ORDER — AZITHROMYCIN 250 MG PO TABS: ORAL_TABLET | ORAL | 0 refills | Status: DC

## 2018-12-19 MED ORDER — ALBUTEROL SULFATE (2.5 MG/3ML) 0.083% IN NEBU
5.0000 mg | INHALATION_SOLUTION | Freq: Once | RESPIRATORY_TRACT | Status: AC
  Administered 2018-12-19: 5 mg via RESPIRATORY_TRACT
  Filled 2018-12-19: qty 6

## 2018-12-19 NOTE — Discharge Instructions (Addendum)
Your COVID test is negative. You appear to have an upper respiratory infection.  You may take Tylenol and Motrin as needed.  Follow-up with your pulmonologist for any issues with your breathing.  I have ordered an a Z-Pak for you to pick up and begin taking tonight.  Follow closely with your primary care physician.

## 2018-12-19 NOTE — ED Triage Notes (Signed)
Pt arrives POV from PCP office for eval of SOB/Cough/Fever x 3 days. Pt also reports gen body aches, decreased sense/taste and smells. Denies covid contacts.

## 2018-12-19 NOTE — ED Provider Notes (Signed)
[facility] EMERGENCY DEPARTMENT Provider Note   CSN: [id number] Arrival date & time: 12/19/18  1605     History   Chief Complaint Chief Complaint  Patient presents with  . Shortness of Breath  . Cough    HPI Patty Howard is a 63 y.o. female who  has a past medical history of Asthma, Asthma, COPD (chronic obstructive pulmonary disease) ([place]), and Hypertension. The patient was sent in by her PCP for viral sxs.        Shortness of Breath Severity:  Moderate Onset quality:  Gradual Duration:  1 week Timing:  Constant Progression:  Worsening Chronicity:  Recurrent Context: activity   Context: not animal exposure, not emotional upset, not fumes, not known allergens, not occupational exposure, not pollens, not smoke exposure, not strong odors, not URI and not weather changes   Relieved by:  Nothing Associated symptoms: cough, fever, headaches, sore throat and wheezing   Associated symptoms: no abdominal pain, no chest pain, no claudication, no diaphoresis, no ear pain, no hemoptysis, no neck pain, no PND, no rash, no sputum production, no syncope, no swollen glands and no vomiting   Risk factors: obesity   Risk factors: no recent alcohol use, no family hx of DVT, no hx of cancer, no hx of PE/DVT, no prolonged immobilization, no recent surgery and no tobacco use   Cough Associated symptoms: chills, fever, headaches, myalgias, shortness of breath, sore throat and wheezing   Associated symptoms: no chest pain, no diaphoresis, no ear pain and no rash     Past Medical History:  Diagnosis Date  . Asthma   . Asthma   . COPD (chronic obstructive pulmonary disease) ([place])   . Hypertension     Patient Active Problem List   Diagnosis Date Noted  . Prediabetes 10/19/2018  . Hypertension, essential, benign 10/19/2018  . 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) ([place]) 10/19/2018  . Insomnia 10/19/2018  . Elbow pain, chronic, left 10/19/2018     Past Surgical History:  Procedure Laterality Date  . BACK SURGERY     spinal stimulator  . LAPAROSCOPIC ROUX-EN-Y GASTRIC BYPASS WITH UPPER ENDOSCOPY AND REMOVAL OF LAP BAND       OB History   No obstetric history on file.      Home Medications    Prior to Admission medications   Medication Sig Start Date End Date Taking? Authorizing Provider  albuterol (PROVENTIL HFA;VENTOLIN HFA) 108 (90 Base) MCG/ACT inhaler Inhale 1-2 puffs into the lungs every 6 (six) hours as needed for wheezing or shortness of breath.    [provider]  fluticasone furoate-vilanterol (BREO ELLIPTA) 200-25 MCG/INH AEPB Inhale 1 puff into the lungs daily.    [provider]  ipratropium (ATROVENT HFA) 17 MCG/ACT inhaler Inhale 3 puffs into the lungs daily.    [provider]  ipratropium-albuterol (DUONEB) 0.5-2.5 (3) MG/3ML SOLN Take 3 mLs by nebulization every 6 (six) hours as needed (sob and wheezing). 11/15/18   Valentina Shaggy, MD  losartan-hydrochlorothiazide (HYZAAR) 100-25 MG tablet Take 1 tablet by mouth daily.    [provider]  pantoprazole (PROTONIX) 40 MG tablet Take 1 tablet (40 mg total) by mouth daily. 11/15/18   Valentina Shaggy, MD  traMADol (ULTRAM) 50 MG tablet Take 1 tablet (50 mg total) by mouth every 6 (six) hours as needed. 10/27/18   Cherylann Ratel, PA-C  umeclidinium bromide (INCRUSE ELLIPTA) 62.5 MCG/INH AEPB Inhale 1 puff into the lungs daily.  [provider]  zolpidem (AMBIEN) 5 MG tablet Take 1 tablet (5 mg total) by mouth at bedtime as needed for sleep. 10/19/18   Martinique, Betty G, MD    Family History Family History  Problem Relation Age of Onset  . Hypertension Mother   . Stroke Mother   . Cancer Father     Social History Social History   Tobacco Use  . Smoking status: Former Smoker    Types: Cigarettes    Quit date: 06/15/1998    Years since quitting: 20.5  . Smokeless tobacco: Never Used  Substance Use  Topics  . Alcohol use: Never    Frequency: Never  . Drug use: Never     Allergies   Banana   Review of Systems Review of Systems  Constitutional: Positive for activity change, appetite change, chills, fatigue and fever. Negative for diaphoresis.  HENT: Positive for sore throat. Negative for ear pain.   Eyes: Negative for photophobia.  Respiratory: Positive for cough, shortness of breath and wheezing. Negative for hemoptysis and sputum production.   Cardiovascular: Negative for chest pain, claudication, syncope and PND.  Gastrointestinal: Negative for abdominal pain and vomiting.  Musculoskeletal: Positive for arthralgias and myalgias. Negative for neck pain.  Skin: Negative for rash.  Neurological: Positive for light-headedness and headaches.     Physical Exam Updated Vital Signs BP (!) 157/77 (BP Location: Right Arm)   Pulse (!) 110   Temp (!) 100.7 F (38.2 C) (Oral)   Resp (!) 24   Ht 5' 10"  (1.778 m)   Wt 131.5 kg   SpO2 99%   BMI 41.61 kg/m   Physical Exam Vitals signs and nursing note reviewed.  Constitutional:      General: She is not in acute distress.    Appearance: She is well-developed. She is not diaphoretic.  HENT:     Head: Normocephalic and atraumatic.  Eyes:     General: No scleral icterus.    Conjunctiva/sclera: Conjunctivae normal.  Neck:     Musculoskeletal: Normal range of motion.  Cardiovascular:     Rate and Rhythm: Normal rate and regular rhythm.     Heart sounds: Normal heart sounds. No murmur. No friction rub. No gallop.   Pulmonary:     Effort: Pulmonary effort is normal. Tachypnea present. No respiratory distress.     Breath sounds: Wheezing present.  Abdominal:     General: Bowel sounds are normal. There is no distension.     Palpations: Abdomen is soft. There is no mass.     Tenderness: There is no abdominal tenderness. There is no guarding.  Skin:    General: Skin is warm and dry.  Neurological:     Mental Status: She is  alert and oriented to person, place, and time.  Psychiatric:        Behavior: Behavior normal.      ED Treatments / Results  Labs (all labs ordered are listed, but only abnormal results are displayed) Labs Reviewed  CBC WITH DIFFERENTIAL/PLATELET - Abnormal; Notable for the following components:      Result Value   MCH 25.9 (*)    All other components within normal limits  SARS CORONAVIRUS 2 (HOSPITAL ORDER, [facility])  COMPREHENSIVE METABOLIC PANEL    EKG EKG Interpretation  Date/Time:  Monday December 19 2018 16:21:00 EDT Ventricular Rate:  112 PR Interval:  148 QRS Duration: 84 QT Interval:  368 QTC Calculation: 502 R Axis:   72  Text Interpretation:  Sinus tachycardia Right atrial enlargement Marked ST abnormality, possible inferior subendocardial injury Abnormal ECG No significant change since last tracing Confirmed by Dorie Rank [phone]) on 12/19/2018 8:17:17 PM   Radiology Dg Chest 2 View  Result Date: 12/19/2018 CLINICAL DATA:  Cough and shortness of breath for 1 week. EXAM: CHEST - 2 VIEW COMPARISON:  11/07/2018 FINDINGS: The heart is upper limits of normal in size and stable. The mediastinal and hilar contours are within normal limits and unchanged. Mild emphysematous and bronchitic type changes but no infiltrates, edema or effusions. A gastric lap band is noted in the left upper quadrant. IMPRESSION: Mild emphysematous and bronchitic type lung changes but no acute pulmonary findings. Electronically Signed   By: Marijo Sanes M.D.   On: 12/19/2018 16:59    Procedures Procedures (including critical care time)  Medications Ordered in ED Medications  albuterol (VENTOLIN HFA) 108 (90 Base) MCG/ACT inhaler 6 puff (has no administration in time range)     Initial Impression / Assessment and Plan / ED Course  I have reviewed the triage vital signs and the nursing notes.  Pertinent labs & imaging results that were available during my care of the  patient were reviewed by me and considered in my medical decision making (see chart for details).        CC:sob/ wheezing VS: BP (!) 156/64   Pulse (!) 108   Temp 100 F (37.8 C) (Oral)   Resp (!) 24   Ht 5' 10"  (1.778 m)   Wt 131.5 kg   SpO2 98%   BMI 41.61 kg/m   SW:FUXNATF is gathered by patietn  and emr. DDX:The emergent differential diagnosis for shortness of breath includes, but is not limited to, Pulmonary edema, bronchoconstriction, Pneumonia, Pulmonary embolism, Pneumotherax/ Hemothorax, Dysrythmia, ACS.   Labs: I reviewed the labs which show Negative COVID< no leukocytosis, CMP without abnormality,  Imaging: I personally reviewed the images (2 cxr) which show(s) no acute abnormalities EKG:  EKG Interpretation  Date/Time:  Monday December 19 2018 16:21:00 EDT Ventricular Rate:  112 PR Interval:  148 QRS Duration: 84 QT Interval:  368 QTC Calculation: 502 R Axis:   72 Text Interpretation:  Sinus tachycardia Right atrial enlargement Marked ST abnormality, possible inferior subendocardial injury Abnormal ECG No significant change since last tracing Confirmed by Dorie Rank [phone]) on 12/19/2018 8:17:17 PM Also confirmed by Dorie Rank [phone]), editor Philomena Doheny [phone])  on 12/20/2018 7:08:51 AM       MDM:.  With COPD exacerbation, and URI symptoms.  Wheezing improved after treatment.  Negative coronavirus testing.  Will discharge with home meds and azithromycin.  Close outpatient follow-up and return precautions given Patient disposition: Discharge Patient condition: Good. The patient appears reasonably screened and/or stabilized for discharge and I doubt any other medical condition or other [facility] requiring further screening, evaluation, or treatment in the ED at this time prior to discharge. I have discussed lab and/or imaging findings with the patient and answered all questions/concerns to the best of my ability. I have discussed return precautions and OP follow up.         Patty Howard was evaluated in Emergency Department on 12/19/2018 for the symptoms described in the history of present illness. She was evaluated in the context of the global COVID-19 pandemic, which necessitated consideration that the patient might be at risk for infection with the SARS-CoV-2 virus that causes COVID-19. Institutional protocols and algorithms that pertain to the evaluation of patients at risk  for COVID-19 are in a state of rapid change based on information released by regulatory bodies including the CDC and federal and state organizations. These policies and algorithms were followed during the patient's care in the ED.  Final Clinical Impressions(s) / ED Diagnoses   Final diagnoses:  None    ED Discharge Orders    None       Margarita Mail, PA-C 12/20/18 2359    Dorie Rank, MD 12/22/18 1416

## 2018-12-20 ENCOUNTER — Other Ambulatory Visit: Payer: Self-pay

## 2018-12-20 MED ORDER — ALBUTEROL SULFATE (5 MG/ML) 0.5% IN NEBU: 2.5000 mg | INHALATION_SOLUTION | Freq: Four times a day (QID) | RESPIRATORY_TRACT | 0 refills | Status: DC | PRN

## 2018-12-20 MED ORDER — ALBUTEROL SULFATE (2.5 MG/3ML) 0.083% IN NEBU: 2.5000 mg | INHALATION_SOLUTION | RESPIRATORY_TRACT | 1 refills | Status: DC | PRN

## 2018-12-20 MED ORDER — IPRATROPIUM BROMIDE 0.02 % IN SOLN: 0.5000 mg | Freq: Four times a day (QID) | RESPIRATORY_TRACT | 0 refills | Status: DC

## 2018-12-20 MED ORDER — IPRATROPIUM BROMIDE 0.02 % IN SOLN: 0.2500 mg | RESPIRATORY_TRACT | 12 refills | Status: DC | PRN

## 2018-12-20 NOTE — Telephone Encounter (Signed)
Fax from pharmacy, duo neb is not covered, but covered individually.

## 2018-12-20 NOTE — ED Notes (Signed)
Patient verbalizes understanding of discharge instructions. Opportunity for questioning and answers were provided. Armband removed by staff, pt discharged from ED.  

## 2019-01-06 ENCOUNTER — Ambulatory Visit: Payer: Self-pay

## 2019-01-06 ENCOUNTER — Other Ambulatory Visit: Payer: Self-pay | Admitting: Orthopedic Surgery

## 2019-01-06 ENCOUNTER — Ambulatory Visit
Admission: RE | Admit: 2019-01-06 | Discharge: 2019-01-06 | Disposition: A | Discharge status: Home / Self Care | Payer: Medicare Other | Source: Ambulatory Visit | Attending: Orthopedic Surgery | Admitting: Orthopedic Surgery

## 2019-01-06 ENCOUNTER — Ambulatory Visit: Payer: Self-pay | Admitting: *Deleted

## 2019-01-06 ENCOUNTER — Telehealth: Payer: Self-pay | Admitting: *Deleted

## 2019-01-06 ENCOUNTER — Other Ambulatory Visit: Payer: Self-pay

## 2019-01-06 DIAGNOSIS — Z77018 Contact with and (suspected) exposure to other hazardous metals: Secondary | ICD-10-CM

## 2019-01-06 DIAGNOSIS — S52122A Displaced fracture of head of left radius, initial encounter for closed fracture: Secondary | ICD-10-CM

## 2019-01-06 MED ORDER — LEVOCETIRIZINE DIHYDROCHLORIDE 5 MG PO TABS: 5.0000 mg | ORAL_TABLET | Freq: Every evening | ORAL | 5 refills | Status: DC

## 2019-01-06 MED ORDER — FLUTICASONE PROPIONATE 50 MCG/ACT NA SUSP: 2.0000 | Freq: Every day | NASAL | 5 refills | Status: DC

## 2019-01-06 MED ORDER — ALBUTEROL SULFATE (2.5 MG/3ML) 0.083% IN NEBU: 2.5000 mg | INHALATION_SOLUTION | RESPIRATORY_TRACT | 1 refills | Status: DC | PRN

## 2019-01-06 NOTE — Telephone Encounter (Signed)
Medication Refill

## 2019-01-10 ENCOUNTER — Ambulatory Visit: Payer: Self-pay | Admitting: Allergy & Immunology

## 2019-01-13 ENCOUNTER — Other Ambulatory Visit: Payer: Self-pay

## 2019-01-13 ENCOUNTER — Ambulatory Visit (INDEPENDENT_AMBULATORY_CARE_PROVIDER_SITE_OTHER): Payer: Medicare Other

## 2019-01-13 ENCOUNTER — Telehealth: Payer: Self-pay | Admitting: *Deleted

## 2019-01-13 DIAGNOSIS — J4550 Severe persistent asthma, uncomplicated: Secondary | ICD-10-CM | POA: Diagnosis not present

## 2019-01-13 MED ORDER — ALBUTEROL SULFATE (2.5 MG/3ML) 0.083% IN NEBU: 2.5000 mg | INHALATION_SOLUTION | RESPIRATORY_TRACT | 1 refills | Status: DC | PRN

## 2019-01-13 MED ORDER — IPRATROPIUM-ALBUTEROL 0.5-2.5 (3) MG/3ML IN SOLN: 3.0000 mL | Freq: Four times a day (QID) | RESPIRATORY_TRACT | 1 refills | Status: DC | PRN

## 2019-01-13 MED ORDER — INCRUSE ELLIPTA 62.5 MCG/INH IN AEPB: 1.0000 | INHALATION_SPRAY | Freq: Every day | RESPIRATORY_TRACT | 1 refills | Status: DC

## 2019-01-13 MED ORDER — PANTOPRAZOLE SODIUM 40 MG PO TBEC: 40.0000 mg | DELAYED_RELEASE_TABLET | Freq: Every day | ORAL | 1 refills | Status: DC

## 2019-01-13 MED ORDER — BENRALIZUMAB 30 MG/ML ~~LOC~~ SOSY
30.0000 mg | PREFILLED_SYRINGE | SUBCUTANEOUS | Status: AC
  Administered 2019-01-13 – 2019-02-15 (×2): 30 mg via SUBCUTANEOUS

## 2019-01-13 NOTE — Telephone Encounter (Signed)
Patient is needing prescriptions for albuterol duoneb (uses 6 times daily), ipratropium bromide, breo, montelukast, and incruse called into her mail in pharmacy. Patient unaware of the pharmacy name. The pharmacy is requesting a list of all medications faxed to them.  Phone number is [phone] Fax number [phone]

## 2019-01-13 NOTE — Telephone Encounter (Signed)
The pharmacy is [facility]

## 2019-01-13 NOTE — Telephone Encounter (Signed)
I have sent in the prescription that Dr. Ernst Bowler ordered for patient at the office visit to [facility].

## 2019-01-17 ENCOUNTER — Encounter: Payer: Self-pay | Admitting: Orthopaedic Surgery

## 2019-01-17 ENCOUNTER — Ambulatory Visit (INDEPENDENT_AMBULATORY_CARE_PROVIDER_SITE_OTHER): Payer: Medicare Other | Admitting: Orthopaedic Surgery

## 2019-01-17 VITALS — BP 141/85 | HR 108 | Ht 70.0 in | Wt 290.0 lb

## 2019-01-17 DIAGNOSIS — M25522 Pain in left elbow: Secondary | ICD-10-CM

## 2019-01-17 DIAGNOSIS — G8929 Other chronic pain: Secondary | ICD-10-CM | POA: Diagnosis not present

## 2019-01-17 NOTE — Addendum Note (Signed)
Addended by: Lendon Collar on: 01/17/2019 04:54 PM   Modules accepted: Orders

## 2019-01-17 NOTE — Progress Notes (Signed)
Office Visit Note   Patient: Patty Howard           Date of Birth: [birth year]           MRN: [id number] Visit Date: 01/17/2019              Requested by: No referring provider defined for this encounter. PCP: Sandi Mariscal, MD   Assessment & Plan: Visit Diagnoses:  1. Elbow pain, chronic, left    MRI scan of left elbow demonstrates a subacute displaced radial head fracture with near 180 degree rotation of an articular portion of the radial head.  The fragment is interposed at the radiocapitellar joint.  There is some high-grade injuries of the lateral elbow stabilizers and advanced cartilage abnormalities of the ulnotrochlear and radiocapitellar joint surfaces.  I think at the very least she would be a candidate for excision of the radial head and insertion of a prosthetic radial head.  I think that would help with any of the radial stabilizers.  She actually has excellent range of motion except for incomplete supination so hopefully the arthritic changes will not be much of an issue in the short run.  Would like Dr. Lorin Mercy to evaluate Follow-Up Instructions: Return Refer to Dr. Lorin Mercy for consideration of surgery.   Orders:  No orders of the defined types were placed in this encounter.  No orders of the defined types were placed in this encounter.     Procedures: No procedures performed   Clinical Data: No additional findings.   Subjective: Chief Complaint  Patient presents with  . Left Elbow - Follow-up  Patient presents today for left elbow pain. She said that her elbow still aches and hurts at night. She had an MRI on 01/06/2019 and is here today for those results.   HPI  Review of Systems  Constitutional: Negative for fatigue.  HENT: Negative for ear pain.   Eyes: Negative for pain.  Respiratory: Negative for shortness of breath.   Cardiovascular: Negative for leg swelling.  Gastrointestinal: Negative for constipation and diarrhea.  Endocrine: Negative for cold  intolerance and heat intolerance.  Genitourinary: Negative for difficulty urinating.  Musculoskeletal: Negative for joint swelling.  Skin: Negative for rash.  Allergic/Immunologic: Positive for food allergies.  Neurological: Negative for weakness.  Hematological: Does not bruise/bleed easily.  Psychiatric/Behavioral: Negative for sleep disturbance.     Objective: Vital Signs: BP (!) 141/85   Pulse (!) 108   Ht 5' 10"  (1.778 m)   Wt 290 lb (131.5 kg)   BMI 41.61 kg/m   Physical Exam Constitutional:      Appearance: She is well-developed.  Eyes:     Pupils: Pupils are equal, round, and reactive to light.  Pulmonary:     Effort: Pulmonary effort is normal.  Skin:    General: Skin is warm and dry.  Neurological:     Mental Status: She is alert and oriented to person, place, and time.  Psychiatric:        Behavior: Behavior normal.     Ortho Exam Awake alert and oriented x3 comfortable sitting.  Left elbow lacks about 10 to 15 degrees to full extension.  Flexion over 120 degrees.  Does have pain in the area of the radial head and lacks about 25 degrees of supination.  Just about full pronation.  Good grip and good release.  Neurologically intact No specialty comments available.  Imaging: No results found.   PMFS History: Patient Active Problem List   Diagnosis  Date Noted  . Prediabetes 10/19/2018  . Hypertension, essential, benign 10/19/2018  . 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) ([place]) 10/19/2018  . Insomnia 10/19/2018  . Elbow pain, chronic, left 10/19/2018   Past Medical History:  Diagnosis Date  . Asthma   . Asthma   . COPD (chronic obstructive pulmonary disease) ([place])   . Hypertension     Family History  Problem Relation Age of Onset  . Hypertension Mother   . Stroke Mother   . Cancer Father     Past Surgical History:  Procedure Laterality Date  . BACK SURGERY     spinal stimulator  . LAPAROSCOPIC ROUX-EN-Y GASTRIC BYPASS WITH  UPPER ENDOSCOPY AND REMOVAL OF LAP BAND     Social History   Occupational History  . Not on file  Tobacco Use  . Smoking status: Former Smoker    Types: Cigarettes    Quit date: 06/15/1998    Years since quitting: 20.6  . Smokeless tobacco: Never Used  Substance and Sexual Activity  . Alcohol use: Never    Frequency: Never  . Drug use: Never  . Sexual activity: Not on file

## 2019-01-19 ENCOUNTER — Ambulatory Visit: Payer: Self-pay | Admitting: Allergy & Immunology

## 2019-01-27 ENCOUNTER — Ambulatory Visit (INDEPENDENT_AMBULATORY_CARE_PROVIDER_SITE_OTHER): Payer: Medicare Other | Admitting: Orthopaedic Surgery

## 2019-01-27 ENCOUNTER — Encounter: Payer: Self-pay | Admitting: Orthopaedic Surgery

## 2019-01-27 DIAGNOSIS — S52122S Displaced fracture of head of left radius, sequela: Secondary | ICD-10-CM

## 2019-01-29 DIAGNOSIS — S52123A Displaced fracture of head of unspecified radius, initial encounter for closed fracture: Secondary | ICD-10-CM | POA: Insufficient documentation

## 2019-01-29 HISTORY — DX: Displaced fracture of head of unspecified radius, initial encounter for closed fracture: S52.123A

## 2019-01-29 NOTE — Progress Notes (Addendum)
Office Visit Note   Patient: Patty Howard           Date of Birth: December 08, 1955           MRN: [id number] Visit Date: 01/27/2019              Requested by: Garald Balding, MD [address] [place],  [place] [address] PCP: Sandi Mariscal, MD   Assessment & Plan: Visit Diagnoses:  1. Closed displaced fracture of head of left radius, sequela     Plan: Plan left radial head replacement excision of the radial head fragments.  We discussed since her injury was in May 2019 she will not get is good range of motion as if it had been done early however it was delayed due to her pulmonary problems and then due to [place].  Extensive scar tissue expected.  Plan would be anesthesia under choice possibly with axillary block.  With her pulmonary status I do not think she likely is a good candidate for suprascapular block due to problems with the left hemidiaphragm.  Another option might be Bier block.  Leave this to decision of  anesthesiology.   If she has an effective block this could be done as an outpatient versus overnight stay in the hospital.  Risks of surgery discussed.  Questions elicited and answered we discussed went over the procedure, sizing, postoperative immobilization for a week and then therapy exercises to regain range of motion of her elbow.  She understands and requests we proceed.  Follow-Up Instructions: No follow-ups on file.   Orders:  No orders of the defined types were placed in this encounter.  No orders of the defined types were placed in this encounter.     Procedures: No procedures performed   Clinical Data: No additional findings.   Subjective: Chief Complaint  Patient presents with  . Left Elbow - Pain    DOI 10/30/17    HPI 63 year old female referred to me by Dr. Durward Fortes for history of a fall in May 2019 with comminuted left radial head fracture when she fell on the pavement.  She had additional injuries to her left wrist and thumb and the thumb  and wrist have been done well.  She was to have surgery in [place] but it was canceled since patient was having pulmonary problems with severe asthma was on prednisone and then moved to [place] surgery was delayed due to [place] and she since has moved to [place].  Patient's had decreased range of motion only 20 degrees supination 85 degrees pronation and lacks 40 degrees extension due to pain in her elbow with swelling.  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 Plain x-ray of her elbow shows a large posterior displaced radial head fracture 40-50% of the head angulated and displaced.  Review of Systems 14 point view of systems positive for the left radial head fracture nonunion from May 2019.  Prediabetes, hypertension.,  Asthma with COPD, insomnia. Obesity and anxiety.  A1c January 2020 was 6.1.  She has had multiple visits to the ER in the past usually every month or 2 for severe asthma with prednisone Dosepaks.  She has had this 4-6 times per year.  01/31/2019 showed FVC 51% and FEV1 55% of predicted.  She is now on Fasenra injections every 1 to 2 months which is significantly improved her asthma.  Objective: Vital Signs: BP (!) 150/77   Pulse (!) 104   Ht  5' 10"  (1.778 m)   Wt 290 lb (131.5 kg)   BMI 41.61 kg/m   Physical Exam Constitutional:      Appearance: She is well-developed.  HENT:     Head: Normocephalic.     Right Ear: External ear normal.     Left Ear: External ear normal.  Eyes:     Pupils: Pupils are equal, round, and reactive to light.  Neck:     Thyroid: No thyromegaly.     Trachea: No tracheal deviation.  Cardiovascular:     Rate and Rhythm: Normal rate.  Pulmonary:     Effort: Pulmonary effort is normal.     Comments: Patient is having normal effort with breathing.  Slight prolonged expiratory phase without active wheezing.  No rhonchi. Abdominal:     Palpations: Abdomen is soft.  Skin:    General: Skin is  warm and dry.  Neurological:     Mental Status: She is alert and oriented to person, place, and time.  Psychiatric:        Behavior: Behavior normal.     Ortho Exam normal shoulder range of motion.  Elbow lacks 40 degrees reaching extension she only supinates 20 degrees versus 85 opposite right arm.  Pronation is good at 85 degrees on the left and 90 on the right.  She is able to flex with thumb 10 cm from her shoulder.  Specialty Comments:  No specialty comments available.  Imaging: CLINICAL DATA:  Elbow fracture.  EXAM: MRI OF THE LEFT ELBOW WITHOUT CONTRAST  TECHNIQUE: Multiplanar, multisequence MR imaging of the elbow was performed. No intravenous contrast was administered.  COMPARISON:  X-ray 10/27/2018  FINDINGS: TENDONS  Common forearm flexor origin: Intact.  Common forearm extensor origin: Appears intact with surrounding edema, likely reactive/posttraumatic.  Biceps: Intact.  Triceps: Intact. There is thickening of the overlying olecranon bursa without bursal fluid.  LIGAMENTS  Medial stabilizers: Mild increased signal within the proximal aspect of the ulnar collateral ligament. Ligament appears overall intact.  Lateral stabilizers: Fluid gap at the expected location of the proximal radial collateral ligament suggesting disruption. Lateral ulnar collateral ligament is attenuated with increased intrinsic signal and periligamentous edema suggesting injury.  Joint: Small complex joint effusion persists, likely resolving hemarthrosis.  Cubital tunnel: Unremarkable.  Bones: Redemonstration of a obliquely oriented fracture through the radial head and neck with the dominant fracture fragment displaced posteriorly up to 6 mm. There is nearly 180 degrees rotation of the dominant fracture fragment with the fractured surface interposed adjacent to the capitellum. The posterior radial head articular surface is oriented away from the capitellum  (series 8, image 8; series 4, image 9). The surface of the intact radial head remains appropriately articulating with the capitellum anteriorly. Marrow edema is still evident within the fracture fragment.  Cartilage: Diffuse surface irregularity of the ulnotrochlear cartilage with areas of full-thickness cartilage fissuring particularly involving the posterior trochlea. High-grade cartilage defects of the capitellum with underlying reactive marrow changes.  IMPRESSION: 1. Subacute, displaced radial head fracture with near 180 degree rotation of the posterior radial head fracture fragment. Fragment is interposed at the radiocapitellar joint with the posterior radial head articular surface oriented away from the joint. 2. High-grade injuries of the lateral elbow stabilizers. 3. Advanced cartilage abnormalities of the ulnotrochlear and radiocapitellar joint surfaces.   Electronically Signed   By: Davina Poke M.D.   On: 01/06/2019 13:41   PMFS History: Patient Active Problem List   Diagnosis Date Noted  . Closed  fracture of radial head 01/29/2019  . Prediabetes 10/19/2018  . Hypertension, essential, benign 10/19/2018  . 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) ([place]) 10/19/2018  . Insomnia 10/19/2018  . Elbow pain, chronic, left 10/19/2018   Past Medical History:  Diagnosis Date  . Asthma   . Asthma   . COPD (chronic obstructive pulmonary disease) ([place])   . Hypertension     Family History  Problem Relation Age of Onset  . Hypertension Mother   . Stroke Mother   . Cancer Father     Past Surgical History:  Procedure Laterality Date  . BACK SURGERY     spinal stimulator  . LAPAROSCOPIC ROUX-EN-Y GASTRIC BYPASS WITH UPPER ENDOSCOPY AND REMOVAL OF LAP BAND     Social History   Occupational History  . Not on file  Tobacco Use  . Smoking status: Former Smoker    Types: Cigarettes    Quit date: 06/15/1998    Years since quitting: 20.6  .  Smokeless tobacco: Never Used  Substance and Sexual Activity  . Alcohol use: Never    Frequency: Never  . Drug use: Never  . Sexual activity: Not on file

## 2019-01-30 ENCOUNTER — Other Ambulatory Visit: Payer: Self-pay

## 2019-01-31 ENCOUNTER — Other Ambulatory Visit (HOSPITAL_COMMUNITY)
Admission: RE | Admit: 2019-01-31 | Discharge: 2019-01-31 | Disposition: A | Discharge status: Home / Self Care | Payer: Medicare Other | Source: Ambulatory Visit | Attending: Orthopaedic Surgery | Admitting: Orthopaedic Surgery

## 2019-01-31 DIAGNOSIS — Z01812 Encounter for preprocedural laboratory examination: Secondary | ICD-10-CM | POA: Diagnosis present

## 2019-01-31 DIAGNOSIS — Z20828 Contact with and (suspected) exposure to other viral communicable diseases: Secondary | ICD-10-CM | POA: Diagnosis not present

## 2019-01-31 LAB — SARS CORONAVIRUS 2 (TAT 6-24 HRS): SARS Coronavirus 2: NEGATIVE

## 2019-02-02 ENCOUNTER — Encounter (HOSPITAL_COMMUNITY): Payer: Self-pay | Admitting: *Deleted

## 2019-02-02 ENCOUNTER — Other Ambulatory Visit: Payer: Self-pay

## 2019-02-02 NOTE — Progress Notes (Signed)
Pt denies any acute pulmonary issues. Pt denies chest pain.  PCP - Dr Sandi Mariscal at [facility] - denies  Chest x-ray - 12/19/18 (Epic) EKG - 12/19/18 (Epic) Stress Test - requested from [facility] in [place] with [place] note and any cardiac studies ECHO - denies Cardiac Cath - denies Labs- requested from PCP with LOV note CPAP - = Apnea; does not wear CPAP Blood Thinner Instructions:n/a Aspirin Instructions:n/a Pt made aware to stop taking  Aspirin (unless otherwise advised by surgeon), vitamins, fish oil and herbal medications. Do not take any NSAIDs ie: Ibuprofen, Advil, Mobic, Naproxen (Aleve), Motrin, BC and Goody Powder.  Anesthesia review: yes; see note.  Patient verbalized understanding of all pre-op instructions.

## 2019-02-02 NOTE — H&P (Signed)
Office Visit Note              Patient: Patty Howard                                  Date of Birth: [birth year]                                                    MRN: [id number] Visit Date: 01/27/2019                                                                     Requested by: Garald Balding, MD [address] [place],  [place] [address] PCP: Sandi Mariscal, MD   Assessment & Plan: Visit Diagnoses:  1. Closed displaced fracture of head of left radius, sequela     Plan: Plan left radial head replacement excision of the radial head fragments.  We discussed since her injury was in May 2019 she will not get is good range of motion as if it had been done early however it was delayed due to her pulmonary problems and then due to [place].  Extensive scar tissue expected.  Plan would be anesthesia under choice possibly with axillary block.  With her pulmonary status I do not think she likely is a good candidate for suprascapular block due to problems with the left hemidiaphragm.  Another option might be Bier block.  Leave this to decision of  anesthesiology.   If she has an effective block this could be done as an outpatient versus overnight stay in the hospital.  Risks of surgery discussed.  Questions elicited and answered we discussed went over the procedure, sizing, postoperative immobilization for a week and then therapy exercises to regain range of motion of her elbow.  She understands and requests we proceed.  Follow-Up Instructions: No follow-ups on file.   Orders:  No orders of the defined types were placed in this encounter.  No orders of the defined types were placed in this encounter.     Procedures: No procedures performed   Clinical Data: No additional findings.   Subjective:     Chief Complaint  Patient presents with  . Left Elbow - Pain    DOI 10/30/17    HPI 63 year old female referred to me by Dr. Durward Fortes for history of a fall in May 2019  with comminuted left radial head fracture when she fell on the pavement.  She had additional injuries to her left wrist and thumb and the thumb and wrist have been done well.  She was to have surgery in [place] but it was canceled since patient was having pulmonary problems with severe asthma was on prednisone and then moved to [place] surgery was delayed due to [place] and she since has moved to [place].  Patient's had decreased range of motion only 20 degrees supination 85 degrees pronation and lacks 40 degrees extension due to pain in her elbow with swelling.  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 Plain x-ray of her elbow shows a large posterior displaced radial head fracture 40-50% of the head angulated and displaced.  Review of Systems 14 point view of systems positive for the left radial head fracture nonunion from May 2019.  Prediabetes, hypertension.,  Asthma with COPD, insomnia. Obesity and anxiety.  A1c January 2020 was 6.1.  She has had multiple visits to the ER in the past usually every month or 2 for severe asthma with prednisone Dosepaks.  She has had this 4-6 times per year.  01/31/2019 showed FVC 51% and FEV1 55% of predicted.  She is now on Fasenra injections every 1 to 2 months which is significantly improved her asthma.  Objective: Vital Signs: BP (!) 150/77   Pulse (!) 104   Ht 5' 10"  (1.778 m)   Wt 290 lb (131.5 kg)   BMI 41.61 kg/m   Physical Exam Constitutional:      Appearance: She is well-developed.  HENT:     Head: Normocephalic.     Right Ear: External ear normal.     Left Ear: External ear normal.  Eyes:     Pupils: Pupils are equal, round, and reactive to light.  Neck:     Thyroid: No thyromegaly.     Trachea: No tracheal deviation.  Cardiovascular:     Rate and Rhythm: Normal rate.  Pulmonary:     Effort: Pulmonary effort is normal.     Comments: Patient is having normal effort with breathing.   Slight prolonged expiratory phase without active wheezing.  No rhonchi. Abdominal:     Palpations: Abdomen is soft.  Skin:    General: Skin is warm and dry.  Neurological:     Mental Status: She is alert and oriented to person, place, and time.  Psychiatric:        Behavior: Behavior normal.     Ortho Exam normal shoulder range of motion.  Elbow lacks 40 degrees reaching extension she only supinates 20 degrees versus 85 opposite right arm.  Pronation is good at 85 degrees on the left and 90 on the right.  She is able to flex with thumb 10 cm from her shoulder.  Specialty Comments:  No specialty comments available.  Imaging: CLINICAL DATA: Elbow fracture.  EXAM: MRI OF THE LEFT ELBOW WITHOUT CONTRAST  TECHNIQUE: Multiplanar, multisequence MR imaging of the elbow was performed. No intravenous contrast was administered.  COMPARISON: X-ray 10/27/2018  FINDINGS: TENDONS  Common forearm flexor origin: Intact.  Common forearm extensor origin: Appears intact with surrounding edema, likely reactive/posttraumatic.  Biceps: Intact.  Triceps: Intact. There is thickening of the overlying olecranon bursa without bursal fluid.  LIGAMENTS  Medial stabilizers: Mild increased signal within the proximal aspect of the ulnar collateral ligament. Ligament appears overall intact.  Lateral stabilizers: Fluid gap at the expected location of the proximal radial collateral ligament suggesting disruption. Lateral ulnar collateral ligament is attenuated with increased intrinsic signal and periligamentous edema suggesting injury.  Joint: Small complex joint effusion persists, likely resolving hemarthrosis.  Cubital tunnel: Unremarkable.  Bones: Redemonstration of a obliquely oriented fracture through the radial head and neck with the dominant fracture fragment displaced posteriorly up to 6 mm. There is nearly 180 degrees rotation of the dominant fracture fragment  with the fractured surface interposed adjacent to the capitellum. The posterior radial head articular surface is oriented away from the capitellum (series 8, image 8; series 4, image 9). The surface of the intact radial head remains appropriately articulating with the capitellum  anteriorly. Marrow edema is still evident within the fracture fragment.  Cartilage: Diffuse surface irregularity of the ulnotrochlear cartilage with areas of full-thickness cartilage fissuring particularly involving the posterior trochlea. High-grade cartilage defects of the capitellum with underlying reactive marrow changes.  IMPRESSION: 1. Subacute, displaced radial head fracture with near 180 degree rotation of the posterior radial head fracture fragment. Fragment is interposed at the radiocapitellar joint with the posterior radial head articular surface oriented away from the joint. 2. High-grade injuries of the lateral elbow stabilizers. 3. Advanced cartilage abnormalities of the ulnotrochlear and radiocapitellar joint surfaces.   Electronically Signed By: Davina Poke M.D. On: 01/06/2019 13:41   PMFS History:     Patient Active Problem List   Diagnosis Date Noted  . Closed fracture of radial head 01/29/2019  . Prediabetes 10/19/2018  . Hypertension, essential, benign 10/19/2018  . 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) ([place]) 10/19/2018  . Insomnia 10/19/2018  . Elbow pain, chronic, left 10/19/2018       Past Medical History:  Diagnosis Date  . Asthma   . Asthma   . COPD (chronic obstructive pulmonary disease) ([place])   . Hypertension          Family History  Problem Relation Age of Onset  . Hypertension Mother   . Stroke Mother   . Cancer Father          Past Surgical History:  Procedure Laterality Date  . BACK SURGERY     spinal stimulator  . LAPAROSCOPIC ROUX-EN-Y GASTRIC BYPASS WITH UPPER ENDOSCOPY AND REMOVAL OF LAP BAND      Social History        Occupational History  . Not on file  Tobacco Use  . Smoking status: Former Smoker    Types: Cigarettes    Quit date: 06/15/1998    Years since quitting: 20.6  . Smokeless tobacco: Never Used  Substance and Sexual Activity  . Alcohol use: Never    Frequency: Never  . Drug use: Never  . Sexual activity: Not on file          Instructions    Return pre-op. Communications    [facility] Provider CC Chart Rep sent to Sandi Mariscal, MD and Garald Balding, MD  Sent 01/29/2019 [place]  Electronic signature on 01/27/2019 2:02 PM - Princess Perna

## 2019-02-02 NOTE — Anesthesia Preprocedure Evaluation (Addendum)
Anesthesia Evaluation  Patient identified by MRN, date of birth, ID band Patient awake    Reviewed: Allergy & Precautions, H&P , NPO status , Patient's Chart, lab work & pertinent test results, reviewed documented beta blocker date and time   Airway Mallampati: II  TM Distance: >3 FB Neck ROM: full    Dental no notable dental hx.    Pulmonary asthma , COPD,  COPD inhaler, former smoker,  Spirometry on 11/15/18: FVC 1.47 (51%), FEV1 1.25 (55%), FEV1R 0.85 (108%), FEF 25-75% 1.34 (63%).   Pulmonary exam normal breath sounds clear to auscultation       Cardiovascular Exercise Tolerance: Good hypertension, negative cardio ROS   Rhythm:regular Rate:Normal  EKG 10/15/18: Sinus tachycardia at 101 BPM Probable left atrial enlargement QT/QTc 358/464 ms   Neuro/Psych negative neurological ROS  negative psych ROS   GI/Hepatic negative GI ROS, Neg liver ROS,   Endo/Other  negative endocrine ROS  Renal/GU negative Renal ROS  negative genitourinary   Musculoskeletal   Abdominal   Peds  Hematology negative hematology ROS (+)   Anesthesia Other Findings   Reproductive/Obstetrics negative OB ROS                            Anesthesia Physical Anesthesia Plan  ASA: III  Anesthesia Plan: MAC   Post-op Pain Management:  Regional for Post-op pain   Induction: Intravenous  PONV Risk Score and Plan: 2 and Ondansetron, Midazolam and Propofol infusion  Airway Management Planned: Nasal Cannula, Simple Face Mask and LMA  Additional Equipment:   Intra-op Plan:   Post-operative Plan:   Informed Consent: I have reviewed the patients History and Physical, chart, labs and discussed the procedure including the risks, benefits and alternatives for the proposed anesthesia with the patient or authorized representative who has indicated his/her understanding and acceptance.     Dental Advisory Given  Plan  Discussed with: CRNA, Anesthesiologist and Surgeon  Anesthesia Plan Comments: (See PAT note written 02/02/2019 by Myra Gianotti, PA-C. SAME DAY WORK-UP   )     Anesthesia Quick Evaluation

## 2019-02-02 NOTE — Progress Notes (Signed)
Anesthesia Chart Review: SAME DAY WORK-UP   Case: [id number] Date/Time: 02/03/19 1045   Procedure: LEFT RADIAL HEAD ARTHROPLASTY (Left ) - AXILLARY BLOCK VS BIER BLOCK   Anesthesia type: Choice   Pre-op diagnosis: left radial fracture nonunion   Location: [facility] OR ROOM 06 / [facility]   Surgeon: [name], MD      DISCUSSION: Patient is a 63 year old female scheduled for the above procedure. She was seen by Dr. Lorin Mercy on 01/27/19. By notes, she fell 10/2017 and sustained left radial head fracture, but surgery delayed in [place] for pulmonary problems and then due to COVID restrictions. He addss, "Plan would be anesthesia under choice possibly with axillary block.  With her pulmonary status I do not think she likely is a good candidate for suprascapular block due to problems with the left hemidiaphragm.  Another option might be Bier block.  Leave this to except anesthesiology.  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 Last BMI recorded was 41.61.  Based on notes, she moved to [facility] from [place] around 10/2018 and prior to that lived in [place]. According to 10/19/18 note by Martinique, Betty, MD, patient had seen a cardiologist in the past with "negative cardiac work-up."  Multiple ED visits for respiratory symptoms: - 12/19/18 for COPD exacerbation. Wheezing improved with treatment. Discharged with home meds and azithromycin.  - 11/07/18 and 10/15/18 for asthma exacerbation. Discharged on prednisone. - ED evaluations (all for respiratory symptoms except for one) on 07/27/18, 07/22/18, 06/19/18, 06/16/18, 06/09/18, and admission 05/29/18 through [facility] (see Care Everywhere).  Spirometry on 11/15/18: FVC 1.47 (51%), FEV1 1.25 (55%), FEV1R  0.85 (108%), FEF 25-75% 1.34 (63%). She is followed by allergist Dr. Ernst Bowler who started her on benralizumab Berna Bue) which as "significantly improved her asthma" per Dr. Lorin Mercy.  Patient is a SAME DAY WORK-UP, and PAT phone RN yet to speak with patient. Available information reviewed with anesthesiologist Adele Barthel, MD. Patient with historically poorly controlled asthma, but according to Dr. Lorin Mercy' notation on 01/31/19, asthma is "significantly improved" since starting [place]. Given patient is a same day work-up, she will have to be further assessed on the day of surgery and definitive anesthesia plan made at that time. If any acute respiratory symptoms or exam findings, then it is possible surgery would have to be delayed. If asymptomatic from a CV standpoint then would not plan to repeat EKG given she has had multiple tracing over the past month with last tracing showing diffuse baseline wanderer.   Negative COVID-19 test 01/31/19.   VS:  Wt Readings from Last 3 Encounters:  01/27/19 131.5 kg  01/17/19 131.5 kg  12/19/18 131.5 kg   Temp Readings from Last 3 Encounters:  12/19/18 37.8 C (Oral)  11/15/18 36.6 C (Temporal)  11/07/18 36.7 C (Oral)   BP Readings from Last 3 Encounters:  01/27/19 (!) 150/77  01/17/19 (!) 141/85  12/19/18 (!) 156/64   Pulse Readings from Last 3 Encounters:  01/27/19 (!) 104  01/17/19 (!) 108  12/19/18 (!) 108    PROVIDERS: Sandi Mariscal, MD is listed as PCP. She also has visits with Martinique, Betty, MD from May 2020.  - Salvatore Marvel, MD is allergist. Last visit 11/15/18. Labs ordered and pulmonary regimen reviewed.  He wanted her to consider starting a biologic. Following testing results he wrote, "Her environmental allergy panel showed sensitizations to dust mites, Aspergillus, and Alternaria. Please send avoidance measures for dust mites and molds. We also looked at some labs to rule out serious causes of asthma. Her alpha 1 antitrypsin level was  normal, which is great. We also looked at Aspergillus precipitants, which can be elevated in a disease called allergic bronchopulmonary aspergillosis. These were negative, which rules this out as a cause of her difficult to control asthma.   We are still working on getting Berna Bue approved for her, but it seems that Lynelle Smoke has already reached out to her today to discuss that process. I think this will help control her asthma much better."  She has since been started on Fasenra.   LABS: She is for updated labs on the day of surgery. As of 12/19/18, CMET WNL, H/H 12.0/39.3, PLT 309.   IMAGES: MR left elbow 01/06/19: IMPRESSION: 1. Subacute, displaced radial head fracture with near 180 degree rotation of the posterior radial head fracture fragment. Fragment is interposed at the radiocapitellar joint with the posterior radial head articular surface oriented away from the joint. 2. High-grade injuries of the lateral elbow stabilizers. 3. Advanced cartilage abnormalities of the ulnotrochlear and radiocapitellar joint surfaces.  CXR 12/19/18: IMPRESSION: Mild emphysematous and bronchitic type lung changes but no acute pulmonary findings.   EKG: - 12/19/18: Sinus tachycardia at 112 BPM Right atrial enlargement Marked ST abnormality, possible inferior subendocardial injury QT/QTc 368/502 ms Abnormal ECG No significant change since last tracing Confirmed by Dorie Rank [phone]) on 12/19/2018 8:17:17 PM - There is diffuse baseline wanderer, but worse in inferior leads which could influence interpretation.  - EKG 11/07/18: Sinus tachycardia at 116 BPM Prolonged QT interval (QT/QTc 372/517 ms) Baseline wander in lead(s) I aVR V4 Otherwise no significant change Confirmed by Addison Lank [phone]) on 11/07/2018 3:56:57 AM  - EKG 10/15/18: Sinus tachycardia at 101 BPM Probable left atrial enlargement QT/QTc 358/464 ms Confirmed by Quintella Reichert [phone]) on 10/15/2018 8:06:55 PM   CV: According  to 10/19/18 note by Martinique, Betty, MD, patient had seen a cardiologist in the past with "negative cardiac work-up." Additional details will be known once pre-operative interview completed.    Past Medical History:  Diagnosis Date  . Asthma   . Asthma   . COPD (chronic obstructive pulmonary disease) ([place])   . Hypertension     Past Surgical History:  Procedure Laterality Date  . BACK SURGERY     spinal stimulator  . LAPAROSCOPIC ROUX-EN-Y GASTRIC BYPASS WITH UPPER ENDOSCOPY AND REMOVAL OF LAP BAND      MEDICATIONS: . Benralizumab SOSY 30 mg   . albuterol (PROVENTIL HFA;VENTOLIN HFA) 108 (90 Base) MCG/ACT inhaler  . Benralizumab (FASENRA [place])  . Cholecalciferol (VITAMIN D3 PO)  . fluticasone (FLONASE) 50 MCG/ACT nasal spray  . hydrOXYzine (ATARAX/VISTARIL) 25 MG tablet  . ipratropium (ATROVENT HFA) 17 MCG/ACT inhaler  . ipratropium (ATROVENT) 0.02 % nebulizer solution  . ipratropium-albuterol (DUONEB) 0.5-2.5 (3) MG/3ML SOLN  . levocetirizine (XYZAL) 5 MG tablet  . losartan-hydrochlorothiazide (HYZAAR) 100-25 MG tablet  . meloxicam (MOBIC) 15 MG tablet  . Multiple Vitamin (MULTIVITAMIN WITH MINERALS) TABS tablet  . traMADol (ULTRAM) 50 MG tablet  . umeclidinium bromide (INCRUSE ELLIPTA) 62.5 MCG/INH AEPB  . zolpidem (AMBIEN) 10 MG tablet  . albuterol (PROVENTIL) (2.5 MG/3ML) 0.083% nebulizer solution  . azithromycin (ZITHROMAX Z-PAK) 250 MG tablet  . baclofen (LIORESAL) 20 MG  tablet  . pantoprazole (PROTONIX) 40 MG tablet  . zolpidem (AMBIEN) 5 MG tablet    Myra Gianotti, PA-C Surgical Short Stay/Anesthesiology [facility] Phone [phone] [facility] Phone [phone] 02/02/2019 4:52 PM

## 2019-02-03 ENCOUNTER — Ambulatory Visit (HOSPITAL_COMMUNITY): Payer: Medicare Other | Admitting: Vascular Surgery

## 2019-02-03 ENCOUNTER — Ambulatory Visit (HOSPITAL_COMMUNITY): Payer: Medicare Other

## 2019-02-03 ENCOUNTER — Encounter (HOSPITAL_COMMUNITY)
Admission: RE | Disposition: A | Discharge status: Home / Self Care | Payer: Self-pay | Source: Ambulatory Visit | Attending: Orthopaedic Surgery

## 2019-02-03 ENCOUNTER — Encounter (HOSPITAL_COMMUNITY): Payer: Self-pay | Admitting: *Deleted

## 2019-02-03 ENCOUNTER — Observation Stay (HOSPITAL_COMMUNITY)
Admission: RE | Admit: 2019-02-03 | Discharge: 2019-02-04 | Disposition: A | Discharge status: Home / Self Care | Payer: Medicare Other | Source: Ambulatory Visit | Attending: Orthopaedic Surgery | Admitting: Orthopaedic Surgery

## 2019-02-03 DIAGNOSIS — S52122A Displaced fracture of head of left radius, initial encounter for closed fracture: Secondary | ICD-10-CM

## 2019-02-03 DIAGNOSIS — W1830XA Fall on same level, unspecified, initial encounter: Secondary | ICD-10-CM | POA: Insufficient documentation

## 2019-02-03 DIAGNOSIS — Z87891 Personal history of nicotine dependence: Secondary | ICD-10-CM | POA: Diagnosis not present

## 2019-02-03 DIAGNOSIS — S52122S Displaced fracture of head of left radius, sequela: Secondary | ICD-10-CM

## 2019-02-03 DIAGNOSIS — Z79899 Other long term (current) drug therapy: Secondary | ICD-10-CM | POA: Diagnosis not present

## 2019-02-03 DIAGNOSIS — J449 Chronic obstructive pulmonary disease, unspecified: Secondary | ICD-10-CM | POA: Diagnosis not present

## 2019-02-03 DIAGNOSIS — Z419 Encounter for procedure for purposes other than remedying health state, unspecified: Secondary | ICD-10-CM

## 2019-02-03 DIAGNOSIS — Z79891 Long term (current) use of opiate analgesic: Secondary | ICD-10-CM | POA: Diagnosis not present

## 2019-02-03 DIAGNOSIS — Z6841 Body Mass Index (BMI) 40.0 and over, adult: Secondary | ICD-10-CM | POA: Insufficient documentation

## 2019-02-03 DIAGNOSIS — E669 Obesity, unspecified: Secondary | ICD-10-CM | POA: Insufficient documentation

## 2019-02-03 DIAGNOSIS — Z791 Long term (current) use of non-steroidal anti-inflammatories (NSAID): Secondary | ICD-10-CM | POA: Insufficient documentation

## 2019-02-03 DIAGNOSIS — G4730 Sleep apnea, unspecified: Secondary | ICD-10-CM | POA: Diagnosis not present

## 2019-02-03 DIAGNOSIS — M1990 Unspecified osteoarthritis, unspecified site: Secondary | ICD-10-CM | POA: Diagnosis not present

## 2019-02-03 DIAGNOSIS — F419 Anxiety disorder, unspecified: Secondary | ICD-10-CM | POA: Diagnosis not present

## 2019-02-03 HISTORY — DX: Unspecified osteoarthritis, unspecified site: M19.90

## 2019-02-03 HISTORY — DX: Other injury of unspecified body region, initial encounter: T14.8XXA

## 2019-02-03 HISTORY — DX: Sleep apnea, unspecified: G47.30

## 2019-02-03 HISTORY — DX: Displaced fracture of head of left radius, initial encounter for closed fracture: S52.122A

## 2019-02-03 HISTORY — DX: Anxiety disorder, unspecified: F41.9

## 2019-02-03 HISTORY — PX: RADIAL HEAD ARTHROPLASTY: SHX6044

## 2019-02-03 LAB — BASIC METABOLIC PANEL
Anion gap: 11 (ref 5–15)
BUN: 14 mg/dL (ref 8–23)
CO2: 23 mmol/L (ref 22–32)
Calcium: 9.3 mg/dL (ref 8.9–10.3)
Chloride: 104 mmol/L (ref 98–111)
Creatinine, Ser: 0.76 mg/dL (ref 0.44–1.00)
GFR calc Af Amer: >60 mL/min (ref >60)
GFR calc non Af Amer: >60 mL/min (ref >60)
Glucose, Bld: 110 mg/dL — ABNORMAL HIGH (ref 70–99)
Potassium: 3.6 mmol/L (ref 3.5–5.1)
Sodium: 138 mmol/L (ref 135–145)

## 2019-02-03 LAB — CBC
HCT: 36.3 % (ref 36.0–46.0)
Hemoglobin: 11.3 g/dL — ABNORMAL LOW (ref 12.0–15.0)
MCH: 26 pg (ref 26.0–34.0)
MCHC: 31.1 g/dL (ref 30.0–36.0)
MCV: 83.6 fL (ref 80.0–100.0)
Platelets: 308 10*3/uL (ref 150–400)
RBC: 4.34 MIL/uL (ref 3.87–5.11)
RDW: 14.3 % (ref 11.5–15.5)
WBC: 5.4 10*3/uL (ref 4.0–10.5)
nRBC: 0 % (ref 0.0–0.2)

## 2019-02-03 SURGERY — ARTHROPLASTY, RADIUS, HEAD
Anesthesia: Monitor Anesthesia Care | Site: Elbow | Laterality: Left

## 2019-02-03 MED ORDER — OXYCODONE HCL 5 MG PO TABS
5.0000 mg | ORAL_TABLET | ORAL | Status: DC | PRN
  Administered 2019-02-04 (×3): 5 mg via ORAL
  Filled 2019-02-03 (×3): qty 1

## 2019-02-03 MED ORDER — BUPIVACAINE HCL (PF) 0.25 % IJ SOLN
INTRAMUSCULAR | Status: AC
  Filled 2019-02-03: qty 30

## 2019-02-03 MED ORDER — LEVOCETIRIZINE DIHYDROCHLORIDE 5 MG PO TABS: 5.0000 mg | ORAL_TABLET | Freq: Every day | ORAL | Status: DC

## 2019-02-03 MED ORDER — MIDAZOLAM HCL 2 MG/2ML IJ SOLN
INTRAMUSCULAR | Status: DC | PRN
  Administered 2019-02-03: 2 mg via INTRAVENOUS

## 2019-02-03 MED ORDER — SODIUM CHLORIDE 0.9 % IV SOLN
INTRAVENOUS | Status: DC | PRN
  Administered 2019-02-03: 40 ug/min via INTRAVENOUS

## 2019-02-03 MED ORDER — ROPIVACAINE HCL 7.5 MG/ML IJ SOLN
INTRAMUSCULAR | Status: DC | PRN
  Administered 2019-02-03: 25 mL via PERINEURAL

## 2019-02-03 MED ORDER — LIDOCAINE HCL 2 % IJ SOLN
INTRAMUSCULAR | Status: AC
  Filled 2019-02-03: qty 20

## 2019-02-03 MED ORDER — FENTANYL CITRATE (PF) 100 MCG/2ML IJ SOLN
INTRAMUSCULAR | Status: AC
  Administered 2019-02-03: 100 ug via INTRAVENOUS
  Filled 2019-02-03: qty 2

## 2019-02-03 MED ORDER — LORATADINE 10 MG PO TABS
10.0000 mg | ORAL_TABLET | Freq: Every day | ORAL | Status: DC
  Administered 2019-02-04: 10 mg via ORAL
  Filled 2019-02-03: qty 1

## 2019-02-03 MED ORDER — FENTANYL CITRATE (PF) 250 MCG/5ML IJ SOLN
INTRAMUSCULAR | Status: AC
  Filled 2019-02-03: qty 5

## 2019-02-03 MED ORDER — SUCCINYLCHOLINE CHLORIDE 200 MG/10ML IV SOSY
PREFILLED_SYRINGE | INTRAVENOUS | Status: AC
  Filled 2019-02-03: qty 10

## 2019-02-03 MED ORDER — HYDROCHLOROTHIAZIDE 25 MG PO TABS
25.0000 mg | ORAL_TABLET | Freq: Every day | ORAL | Status: DC
  Administered 2019-02-04: 25 mg via ORAL
  Filled 2019-02-03: qty 1

## 2019-02-03 MED ORDER — HYDROMORPHONE HCL 1 MG/ML IJ SOLN
0.5000 mg | INTRAMUSCULAR | Status: DC | PRN
  Administered 2019-02-04 (×2): 0.5 mg via INTRAVENOUS
  Filled 2019-02-03 (×2): qty 1

## 2019-02-03 MED ORDER — CEFAZOLIN SODIUM 1 G IJ SOLR
INTRAMUSCULAR | Status: AC
  Filled 2019-02-03: qty 30

## 2019-02-03 MED ORDER — FENTANYL CITRATE (PF) 100 MCG/2ML IJ SOLN
100.0000 ug | Freq: Once | INTRAMUSCULAR | Status: AC
  Administered 2019-02-03: 100 ug via INTRAVENOUS

## 2019-02-03 MED ORDER — 0.9 % SODIUM CHLORIDE (POUR BTL) OPTIME
TOPICAL | Status: DC | PRN
  Administered 2019-02-03: 1000 mL

## 2019-02-03 MED ORDER — IPRATROPIUM BROMIDE 0.02 % IN SOLN
0.2500 mg | RESPIRATORY_TRACT | Status: DC | PRN
  Filled 2019-02-03: qty 2.5

## 2019-02-03 MED ORDER — IPRATROPIUM-ALBUTEROL 0.5-2.5 (3) MG/3ML IN SOLN: 3.0000 mL | RESPIRATORY_TRACT | Status: DC

## 2019-02-03 MED ORDER — ONDANSETRON HCL 4 MG PO TABS: 4.0000 mg | ORAL_TABLET | Freq: Four times a day (QID) | ORAL | Status: DC | PRN

## 2019-02-03 MED ORDER — IPRATROPIUM BROMIDE HFA 17 MCG/ACT IN AERS: 3.0000 | INHALATION_SPRAY | Freq: Every day | RESPIRATORY_TRACT | Status: DC

## 2019-02-03 MED ORDER — PROPOFOL 1000 MG/100ML IV EMUL
INTRAVENOUS | Status: AC
  Filled 2019-02-03: qty 200

## 2019-02-03 MED ORDER — HYDROXYZINE HCL 25 MG PO TABS: 25.0000 mg | ORAL_TABLET | Freq: Three times a day (TID) | ORAL | Status: DC | PRN

## 2019-02-03 MED ORDER — CEFAZOLIN SODIUM-DEXTROSE 1-4 GM/50ML-% IV SOLN
1.0000 g | Freq: Three times a day (TID) | INTRAVENOUS | Status: AC
  Administered 2019-02-03 – 2019-02-04 (×2): 1 g via INTRAVENOUS
  Filled 2019-02-03 (×2): qty 50

## 2019-02-03 MED ORDER — MIDAZOLAM HCL 2 MG/2ML IJ SOLN
2.0000 mg | Freq: Once | INTRAMUSCULAR | Status: AC
  Administered 2019-02-03: 2 mg via INTRAVENOUS

## 2019-02-03 MED ORDER — CLONIDINE HCL (ANALGESIA) 100 MCG/ML EP SOLN
EPIDURAL | Status: DC | PRN
  Administered 2019-02-03: 100 ug

## 2019-02-03 MED ORDER — SODIUM CHLORIDE 0.9 % IV SOLN
INTRAVENOUS | Status: DC
  Administered 2019-02-03 – 2019-02-04 (×2): via INTRAVENOUS

## 2019-02-03 MED ORDER — DOCUSATE SODIUM 100 MG PO CAPS
100.0000 mg | ORAL_CAPSULE | Freq: Two times a day (BID) | ORAL | Status: DC
  Administered 2019-02-03 – 2019-02-04 (×2): 100 mg via ORAL
  Filled 2019-02-03 (×2): qty 1

## 2019-02-03 MED ORDER — METOCLOPRAMIDE HCL 5 MG PO TABS: 5.0000 mg | ORAL_TABLET | Freq: Three times a day (TID) | ORAL | Status: DC | PRN

## 2019-02-03 MED ORDER — GLYCOPYRROLATE 0.2 MG/ML IJ SOLN
INTRAMUSCULAR | Status: DC | PRN
  Administered 2019-02-03: 0.2 mg via INTRAVENOUS

## 2019-02-03 MED ORDER — UMECLIDINIUM BROMIDE 62.5 MCG/INH IN AEPB
1.0000 | INHALATION_SPRAY | Freq: Every day | RESPIRATORY_TRACT | Status: DC
  Administered 2019-02-04: 1 via RESPIRATORY_TRACT
  Filled 2019-02-03: qty 7

## 2019-02-03 MED ORDER — METOCLOPRAMIDE HCL 5 MG/ML IJ SOLN: 5.0000 mg | Freq: Three times a day (TID) | INTRAMUSCULAR | Status: DC | PRN

## 2019-02-03 MED ORDER — LOSARTAN POTASSIUM 50 MG PO TABS
100.0000 mg | ORAL_TABLET | Freq: Every day | ORAL | Status: DC
  Administered 2019-02-04: 100 mg via ORAL
  Filled 2019-02-03: qty 2

## 2019-02-03 MED ORDER — ONDANSETRON HCL 4 MG/2ML IJ SOLN
INTRAMUSCULAR | Status: AC
  Filled 2019-02-03: qty 2

## 2019-02-03 MED ORDER — PROPOFOL 10 MG/ML IV BOLUS
INTRAVENOUS | Status: DC | PRN
  Administered 2019-02-03: 20 mg via INTRAVENOUS

## 2019-02-03 MED ORDER — LIDOCAINE HCL (CARDIAC) PF 100 MG/5ML IV SOSY
PREFILLED_SYRINGE | INTRAVENOUS | Status: DC | PRN
  Administered 2019-02-03: 100 mg via INTRAVENOUS

## 2019-02-03 MED ORDER — FENTANYL CITRATE (PF) 100 MCG/2ML IJ SOLN
INTRAMUSCULAR | Status: DC | PRN
  Administered 2019-02-03: 50 ug via INTRAVENOUS
  Administered 2019-02-03: 25 ug via INTRAVENOUS

## 2019-02-03 MED ORDER — ACETAMINOPHEN 325 MG PO TABS
325.0000 mg | ORAL_TABLET | Freq: Four times a day (QID) | ORAL | Status: DC | PRN
  Administered 2019-02-04: 650 mg via ORAL
  Filled 2019-02-03: qty 2

## 2019-02-03 MED ORDER — LOSARTAN POTASSIUM-HCTZ 100-25 MG PO TABS: 1.0000 | ORAL_TABLET | Freq: Every day | ORAL | Status: DC

## 2019-02-03 MED ORDER — MIDAZOLAM HCL 2 MG/2ML IJ SOLN
INTRAMUSCULAR | Status: AC
  Administered 2019-02-03: 2 mg via INTRAVENOUS
  Filled 2019-02-03: qty 2

## 2019-02-03 MED ORDER — PROPOFOL 10 MG/ML IV BOLUS
INTRAVENOUS | Status: AC
  Filled 2019-02-03: qty 20

## 2019-02-03 MED ORDER — ZOLPIDEM TARTRATE 5 MG PO TABS
5.0000 mg | ORAL_TABLET | Freq: Every evening | ORAL | Status: DC | PRN
  Administered 2019-02-03: 5 mg via ORAL
  Filled 2019-02-03 (×2): qty 1

## 2019-02-03 MED ORDER — LACTATED RINGERS IV SOLN
INTRAVENOUS | Status: DC
  Administered 2019-02-03: 10:00:00 via INTRAVENOUS

## 2019-02-03 MED ORDER — PROPOFOL 500 MG/50ML IV EMUL
INTRAVENOUS | Status: DC | PRN
  Administered 2019-02-03: 75 ug/kg/min via INTRAVENOUS
  Administered 2019-02-03: 100 ug/kg/min via INTRAVENOUS

## 2019-02-03 MED ORDER — OXYCODONE-ACETAMINOPHEN 5-325 MG PO TABS: 1.0000 | ORAL_TABLET | Freq: Four times a day (QID) | ORAL | 0 refills | Status: DC | PRN

## 2019-02-03 MED ORDER — DEXTROSE 5 % IV SOLN
INTRAVENOUS | Status: DC | PRN
  Administered 2019-02-03: 3 g via INTRAVENOUS

## 2019-02-03 MED ORDER — BUPIVACAINE HCL (PF) 0.25 % IJ SOLN
INTRAMUSCULAR | Status: DC | PRN
  Administered 2019-02-03: 20 mL
  Administered 2019-02-03: 6 mL

## 2019-02-03 MED ORDER — POLYETHYLENE GLYCOL 3350 17 G PO PACK: 17.0000 g | PACK | Freq: Every day | ORAL | Status: DC | PRN

## 2019-02-03 MED ORDER — FLUTICASONE PROPIONATE 50 MCG/ACT NA SUSP
2.0000 | Freq: Every day | NASAL | Status: DC | PRN
  Filled 2019-02-03: qty 16

## 2019-02-03 MED ORDER — PHENYLEPHRINE HCL (PRESSORS) 10 MG/ML IV SOLN
INTRAVENOUS | Status: DC | PRN
  Administered 2019-02-03 (×3): 80 ug via INTRAVENOUS
  Administered 2019-02-03: 120 ug via INTRAVENOUS

## 2019-02-03 MED ORDER — ONDANSETRON HCL 4 MG/2ML IJ SOLN
INTRAMUSCULAR | Status: DC | PRN
  Administered 2019-02-03: 4 mg via INTRAVENOUS

## 2019-02-03 MED ORDER — ALBUTEROL SULFATE (2.5 MG/3ML) 0.083% IN NEBU
3.0000 mL | INHALATION_SOLUTION | Freq: Four times a day (QID) | RESPIRATORY_TRACT | Status: DC | PRN
  Administered 2019-02-03 – 2019-02-04 (×2): 3 mL via RESPIRATORY_TRACT
  Filled 2019-02-03 (×2): qty 3

## 2019-02-03 MED ORDER — ONDANSETRON HCL 4 MG/2ML IJ SOLN
4.0000 mg | Freq: Four times a day (QID) | INTRAMUSCULAR | Status: DC | PRN
  Administered 2019-02-04: 4 mg via INTRAVENOUS
  Filled 2019-02-03: qty 2

## 2019-02-03 MED ORDER — MIDAZOLAM HCL 2 MG/2ML IJ SOLN
INTRAMUSCULAR | Status: AC
  Filled 2019-02-03: qty 2

## 2019-02-03 SURGICAL SUPPLY — 70 items
BENZOIN TINCTURE PRP APPL 2/3 (GAUZE/BANDAGES/DRESSINGS) IMPLANT
BLADE AVERAGE 25X9 (BLADE) ×2 IMPLANT
BLADE CLIPPER SURG (BLADE) ×2 IMPLANT
BLADE SURG 10 STRL SS (BLADE) IMPLANT
BNDG COHESIVE 4X5 TAN STRL (GAUZE/BANDAGES/DRESSINGS) ×2 IMPLANT
BNDG ELASTIC 3X5.8 VLCR STR LF (GAUZE/BANDAGES/DRESSINGS) ×2 IMPLANT
BNDG ELASTIC 4X5.8 VLCR STR LF (GAUZE/BANDAGES/DRESSINGS) ×2 IMPLANT
BNDG ELASTIC 6X5.8 VLCR STR LF (GAUZE/BANDAGES/DRESSINGS) ×2 IMPLANT
BNDG ESMARK 4X9 LF (GAUZE/BANDAGES/DRESSINGS) ×2 IMPLANT
BNDG GAUZE ELAST 4 BULKY (GAUZE/BANDAGES/DRESSINGS) ×2 IMPLANT
BRUSH SCRUB EZ PLAIN DRY (MISCELLANEOUS) ×4 IMPLANT
CLEANER TIP ELECTROSURG 2X2 (MISCELLANEOUS) ×2 IMPLANT
COVER SURGICAL LIGHT HANDLE (MISCELLANEOUS) ×4 IMPLANT
COVER WAND RF STERILE (DRAPES) ×2 IMPLANT
CUFF TOURN SGL QUICK 18X4 (TOURNIQUET CUFF) IMPLANT
CUFF TOURN SGL QUICK 24 (TOURNIQUET CUFF)
CUFF TRNQT CYL 24X4X16.5-23 (TOURNIQUET CUFF) IMPLANT
DECANTER SPIKE VIAL GLASS SM (MISCELLANEOUS) IMPLANT
DRAPE C-ARM 42X72 X-RAY (DRAPES) ×2 IMPLANT
DRAPE C-ARMOR (DRAPES) ×2 IMPLANT
DRAPE INCISE IOBAN 66X45 STRL (DRAPES) IMPLANT
DRAPE U-SHAPE 47X51 STRL (DRAPES) ×2 IMPLANT
DRSG ADAPTIC 3X8 NADH LF (GAUZE/BANDAGES/DRESSINGS) IMPLANT
DRSG EMULSION OIL 3X3 NADH (GAUZE/BANDAGES/DRESSINGS) IMPLANT
ELECT REM PT RETURN 9FT ADLT (ELECTROSURGICAL) ×2
ELECTRODE REM PT RTRN 9FT ADLT (ELECTROSURGICAL) ×1 IMPLANT
FACESHIELD WRAPAROUND (MASK) IMPLANT
GAUZE SPONGE 4X4 12PLY STRL (GAUZE/BANDAGES/DRESSINGS) ×2 IMPLANT
GAUZE XEROFORM 1X8 LF (GAUZE/BANDAGES/DRESSINGS) ×2 IMPLANT
GAUZE XEROFORM 5X9 LF (GAUZE/BANDAGES/DRESSINGS) ×2 IMPLANT
GLOVE BIOGEL PI IND STRL 8 (GLOVE) ×2 IMPLANT
GLOVE BIOGEL PI INDICATOR 8 (GLOVE) ×2
GLOVE ORTHO TXT STRL SZ7.5 (GLOVE) ×4 IMPLANT
GOWN STRL REUS W/ TWL LRG LVL3 (GOWN DISPOSABLE) ×1 IMPLANT
GOWN STRL REUS W/ TWL XL LVL3 (GOWN DISPOSABLE) ×1 IMPLANT
GOWN STRL REUS W/TWL 2XL LVL3 (GOWN DISPOSABLE) ×2 IMPLANT
GOWN STRL REUS W/TWL LRG LVL3 (GOWN DISPOSABLE) ×1
GOWN STRL REUS W/TWL XL LVL3 (GOWN DISPOSABLE) ×1
HEAD RADIAL COCH 18X24 (Head) ×2 IMPLANT
IMPL STEM WITH SCREW 8X29 (Stem) ×1 IMPLANT
IMPLANT STEM WITH SCREW 8X29 (Stem) ×2 IMPLANT
KIT BASIN OR (CUSTOM PROCEDURE TRAY) ×2 IMPLANT
KIT TURNOVER KIT B (KITS) ×2 IMPLANT
MANIFOLD NEPTUNE II (INSTRUMENTS) ×2 IMPLANT
NEEDLE HYPO 25GX1X1/2 BEV (NEEDLE) ×2 IMPLANT
NS IRRIG 1000ML POUR BTL (IV SOLUTION) ×2 IMPLANT
PACK ORTHO EXTREMITY (CUSTOM PROCEDURE TRAY) ×2 IMPLANT
PAD ARMBOARD 7.5X6 YLW CONV (MISCELLANEOUS) ×4 IMPLANT
PAD CAST 4YDX4 CTTN HI CHSV (CAST SUPPLIES) ×1 IMPLANT
PADDING CAST COTTON 4X4 STRL (CAST SUPPLIES) ×1
SLING ARM FOAM STRAP XLG (SOFTGOODS) ×2 IMPLANT
SPONGE LAP 18X18 RF (DISPOSABLE) ×4 IMPLANT
STAPLER VISISTAT 35W (STAPLE) ×2 IMPLANT
STRIP CLOSURE SKIN 1/2X4 (GAUZE/BANDAGES/DRESSINGS) IMPLANT
SUCTION FRAZIER HANDLE 10FR (MISCELLANEOUS) ×1
SUCTION TUBE FRAZIER 10FR DISP (MISCELLANEOUS) ×1 IMPLANT
SUT PDS AB 2-0 CT1 27 (SUTURE) IMPLANT
SUT PROLENE 3 0 PS 2 (SUTURE) ×4 IMPLANT
SUT VIC AB 0 CT1 27 (SUTURE) ×2
SUT VIC AB 0 CT1 27XBRD ANBCTR (SUTURE) ×2 IMPLANT
SUT VIC AB 2-0 CT1 27 (SUTURE) ×2
SUT VIC AB 2-0 CT1 TAPERPNT 27 (SUTURE) ×2 IMPLANT
SUT VIC AB 2-0 CT3 27 (SUTURE) IMPLANT
SYR CONTROL 10ML LL (SYRINGE) ×2 IMPLANT
TOWEL GREEN STERILE (TOWEL DISPOSABLE) ×4 IMPLANT
TOWEL GREEN STERILE FF (TOWEL DISPOSABLE) IMPLANT
TUBE CONNECTING 12X1/4 (SUCTIONS) ×2 IMPLANT
UNDERPAD 30X30 (UNDERPADS AND DIAPERS) ×2 IMPLANT
WATER STERILE IRR 1000ML POUR (IV SOLUTION) ×2 IMPLANT
YANKAUER SUCT BULB TIP NO VENT (SUCTIONS) ×2 IMPLANT

## 2019-02-03 NOTE — Discharge Instructions (Addendum)
Do not remove splint/dressing or get wet!  Wear sling.    No driving, lifting, pushing, pulling.    Call office for return visit in one week.  Call office for weakness or increased pain . You had a block and will have numbness and weakness that will last for 24 to 48 hrs. Use ice off and on over the splint to help pain and swelling from surgery

## 2019-02-03 NOTE — Transfer of Care (Signed)
Immediate Anesthesia Transfer of Care Note  Patient: Patty Howard  Procedure(s) Performed: LEFT RADIAL HEAD ARTHROPLASTY (Left Elbow)  Patient Location: PACU  Anesthesia Type:MAC combined with regional for post-op pain  Level of Consciousness: drowsy  Airway & Oxygen Therapy: Patient Spontanous Breathing and Patient connected to face mask oxygen  Post-op Assessment: Report given to RN, Post -op Vital signs reviewed and stable and Patient moving all extremities  Post vital signs: Reviewed and stable  Last Vitals:  Vitals Value Taken Time  BP 89/54 02/03/19 1253  Temp    Pulse 75 02/03/19 1252  Resp 14 02/03/19 1253  SpO2 100 % 02/03/19 1252  Vitals shown include unvalidated device data.  Last Pain:  Vitals:   02/03/19 1250  TempSrc:   PainSc: (P) Asleep      Patients Stated Pain Goal: 5 (85/92/92 4462)  Complications: No apparent anesthesia complications

## 2019-02-03 NOTE — Interval H&P Note (Signed)
History and Physical Interval Note:  02/03/2019 9:48 AM  Patty Howard  has presented today for surgery, with the diagnosis of left radial fracture nonunion.  The various methods of treatment have been discussed with the patient and family. After consideration of risks, benefits and other options for treatment, the patient has consented to  Procedure(s) with comments: LEFT RADIAL HEAD ARTHROPLASTY (Left) - AXILLARY BLOCK VS BIER BLOCK as a surgical intervention.  The patient's history has been reviewed, patient examined, no change in status, stable for surgery.  I have reviewed the patient's chart and labs.  Questions were answered to the patient's satisfaction.     [name]

## 2019-02-03 NOTE — Anesthesia Postprocedure Evaluation (Signed)
Anesthesia Post Note  Patient: Patty Howard  Procedure(s) Performed: LEFT RADIAL HEAD ARTHROPLASTY (Left Elbow)     Patient location during evaluation: PACU Anesthesia Type: MAC Level of consciousness: awake and alert Pain management: pain level controlled Vital Signs Assessment: post-procedure vital signs reviewed and stable Respiratory status: spontaneous breathing, nonlabored ventilation, respiratory function stable and patient connected to nasal cannula oxygen Cardiovascular status: stable and blood pressure returned to baseline Postop Assessment: no apparent nausea or vomiting Anesthetic complications: no    Last Vitals:  Vitals:   02/03/19 1321 02/03/19 1328  BP: 97/64   Pulse:  70  Resp: 12 12  Temp:    SpO2:  96%    Last Pain:  Vitals:   02/03/19 1320  TempSrc:   PainSc: 0-No pain                 Joandry Slagter

## 2019-02-03 NOTE — Anesthesia Procedure Notes (Addendum)
Anesthesia Regional Block: Axillary brachial plexus block   Pre-Anesthetic Checklist: ,, timeout performed, Correct Patient, Correct Site, Correct Laterality, Correct Procedure, Correct Position, site marked, Risks and benefits discussed,  Surgical consent,  Pre-op evaluation,  At surgeon's request and post-op pain management  Laterality: Left  Prep: chloraprep       Needles:  Injection technique: Single-shot  Needle Type: Echogenic Stimulator Needle     Needle Length: 5cm  Needle Gauge: 22     Additional Needles:   Procedures:, nerve stimulator,,, ultrasound used (permanent image in chart),,,,   Nerve Stimulator or Paresthesia:  Response: hand, 0.45 mA,   Additional Responses:   Narrative:  Start time: 02/03/2019 10:10 AM End time: 02/03/2019 10:15 AM Injection made incrementally with aspirations every 5 mL.  Performed by: Personally  Anesthesiologist: Duane Boston, MD  Additional Notes: Functioning IV was confirmed and monitors were applied.  A 18m 22ga Arrow echogenic stimulator needle was used. Sterile prep and drape,hand hygiene and sterile gloves were used. Ultrasound guidance: relevant anatomy identified, needle position confirmed, local anesthetic spread visualized around nerve(s)., vascular puncture avoided.  Image printed for medical record. Negative aspiration and negative test dose prior to incremental administration of local anesthetic. The patient tolerated the procedure well.

## 2019-02-03 NOTE — Op Note (Signed)
Preop diagnosis: Left radial head fracture, chronic displaced with malunion/nonunion  Postop diagnosis: Same  Procedure: Left radial head excision and radial head arthroplasty.  Surgeon: Rodell Perna, MD  Assistant: Benjiman Core, PA-C medically necessary and present for the entire procedure  Tourniquet: 250 x 65 minutes  Implants:Biomet 18X52m radial head implant with size 8 stem.  Procedure: After axillary nerve block with good sensory block but partial motor block patient underwent IV sedation had proximal arm tourniquet applied, left arm table, DuraPrep Ancef 3 g prophylaxis timeout procedure was completed.  Prepping for the fingertips the tourniquet was perform usual extremity sheets and drapes were applied  Arm is wrapped in Esmarch tourniquet inflated 250.  Sterile skin marker was used and incision was made starting at the lateral epicondyle in line with the extensor fibers exposing the radial head.  Radial head was split into in the posterior portion of the radial head was significantly posterior.  There was extensive scar tissue since patient initially had her fracture in May and has been scheduled twice in 2 different states for surgery and this was canceled due to acute asthma problems with admission in the second time canceled due to COVID restrictions.  She presented with limited range of motion and forearm rotation.  With great difficulty the radial head fragments were removed excising scar tissue and care was taken to make sure the fragments could be removed to measure them.  There is hypertrophic bone present which appeared to enlarge the had an measurement of the head showed it was between a 16 and 18 size.  Portion of the articular surface on the ulna had been damaged due to the posterior half of the radial head which had rotated more than 90degrees.  2 large fragments were removed and tried to be placed together for measurements and an 18 size was selected.  Neck was cut with an  oscillating saw and sequential progression up to a 7 size which was slightly loose.  8 broach was placed down.  Trial sizers were used and +24 mm neck length restored radial length and and supination there was good position and full pronation the radial head had slight subluxation of about 30%.  Permanent prosthesis was opened and impaction the last millimeter small crack occurred in the radial neck but the prosthesis was extremely secure.  Permanent eighteen +24 mm head was inserted tightened down with a screw with appropriate positioning of the screw mid lateral position.  Head was reduced and there was supination pronation checked under fluoroscopy and only with full pronation was her slight posterior subluxation with supination there was good position with the middle of the radial head centered on the capitellum.  There was some portions of the annular ligament left portion had been damaged.  With some pressure using baby Homans neuro portion of the annular ligament held the radial neck up against the ulna.  Copious irrigation was performed tourniquet was deflated.  Fascia was closed with 2-0 Vicryl subtenons tissue reapproximated 3-0 Vicryl skin staple closure postop dressing and long-arm splint with arm and partial supination position.  Prior to placement of the prosthesis spurs had to be removed off the radial neck which were prominent and were blocking supination pronation and were due to excessive callus formation and attempt to try to heal the malunion nonunion.  With removal of the spurs and with the trial and permanent implant there was good supination pronation passively and good flexion and extension full extension.  Joint was not overstuffed  and after application of splint and skin closure as described above patient was transferred to the recovery room in stable condition.  Due to her severe pulmonary problems will stay overnight and will be discharged in a.m. with office follow-up 1 week.  Marcaine  was infiltrated 10 cc at the end of the case for some postoperative analgesia.

## 2019-02-04 DIAGNOSIS — S52122A Displaced fracture of head of left radius, initial encounter for closed fracture: Secondary | ICD-10-CM | POA: Diagnosis not present

## 2019-02-04 MED ORDER — GUAIFENESIN ER 600 MG PO TB12
600.0000 mg | ORAL_TABLET | Freq: Two times a day (BID) | ORAL | Status: DC
  Administered 2019-02-04 (×2): 600 mg via ORAL
  Filled 2019-02-04 (×2): qty 1

## 2019-02-04 NOTE — Progress Notes (Signed)
Subjective: 1 Day Post-Op Procedure(s) (LRB): LEFT RADIAL HEAD ARTHROPLASTY (Left) Patient reports pain as moderate.    Objective: Vital signs in last 24 hours: Temp:  [97 F (36.1 C)-99.5 F (37.5 C)] 98.3 F (36.8 C) (08/22 0530) Pulse Rate:  [70-95] 94 (08/22 0530) Resp:  [12-21] 16 (08/22 0530) BP: (91-130)/(49-83) 119/71 (08/22 0530) SpO2:  [94 %-100 %] 98 % (08/22 0530) Weight:  [131.5 kg-135.6 kg] 131.5 kg (08/21 1412)  Intake/Output from previous day: 08/21 0701 - 08/22 0700 In: 2132.8 [P.O.:480; I.V.:1602.8; IV Piggyback:50] Out: 25 [Blood:25] Intake/Output this shift: No intake/output data recorded.  Recent Labs    02/03/19 0910  HGB 11.3*   Recent Labs    02/03/19 0910  WBC 5.4  RBC 4.34  HCT 36.3  PLT 308   Recent Labs    02/03/19 0910  NA 138  K 3.6  CL 104  CO2 23  BUN 14  CREATININE 0.76  GLUCOSE 110*  CALCIUM 9.3   No results for input(s): LABPT, INR in the last 72 hours.  Neurovascular intact Splint clean dry and intact  Assessment/Plan: 1 Day Post-Op Procedure(s) (LRB): LEFT RADIAL HEAD ARTHROPLASTY (Left) Discharge to home.      GILBERT CLARK 02/04/2019, 8:01 AM

## 2019-02-04 NOTE — Plan of Care (Signed)

## 2019-02-07 ENCOUNTER — Encounter (HOSPITAL_COMMUNITY): Payer: Self-pay | Admitting: Orthopaedic Surgery

## 2019-02-07 ENCOUNTER — Other Ambulatory Visit: Payer: Self-pay

## 2019-02-07 MED ORDER — LEVOCETIRIZINE DIHYDROCHLORIDE 5 MG PO TABS: 5.0000 mg | ORAL_TABLET | Freq: Every evening | ORAL | 1 refills | Status: DC

## 2019-02-07 MED ORDER — FLUTICASONE PROPIONATE 50 MCG/ACT NA SUSP: 2.0000 | Freq: Every day | NASAL | 1 refills | Status: DC | PRN

## 2019-02-07 MED ORDER — IPRATROPIUM-ALBUTEROL 0.5-2.5 (3) MG/3ML IN SOLN: 3.0000 mL | Freq: Four times a day (QID) | RESPIRATORY_TRACT | 1 refills | Status: DC | PRN

## 2019-02-07 MED ORDER — ALBUTEROL SULFATE HFA 108 (90 BASE) MCG/ACT IN AERS: 1.0000 | INHALATION_SPRAY | Freq: Four times a day (QID) | RESPIRATORY_TRACT | 0 refills | Status: DC | PRN

## 2019-02-07 NOTE — Telephone Encounter (Signed)
Prescription refills requested from [facility] for ExactPack meds. I sent in the requested medications for patient as prescribed by Dr. Ernst Bowler.

## 2019-02-07 NOTE — Discharge Summary (Signed)
Patient ID: Patty Howard MRN: [id number] DOB/AGE: [birth year] 63 y.o.  Admit date: 02/03/2019 Discharge date: 02/07/2019  Admission Diagnoses:  Active Problems:   Left radial head fracture   Discharge Diagnoses:  Active Problems:   Left radial head fracture  status post Procedure(s): LEFT RADIAL HEAD ARTHROPLASTY  Past Medical History:  Diagnosis Date  . Anxiety   . Arthritis   . Asthma   . Asthma   . COPD (chronic obstructive pulmonary disease) ([place])   . Fracture    closed displaced left radial head  . Hypertension   . Sleep apnea    does not wear CPAP    Surgeries: Procedure(s): LEFT RADIAL HEAD ARTHROPLASTY on 02/03/2019   Consultants:   Discharged Condition: Improved  Hospital Course: Patty Howard is an 63 y.o. female who was admitted 02/03/2019 for operative treatment of left radial head fracture.  Patient failed conservative treatments (please see the history and physical for the specifics) and had severe unremitting pain that affects sleep, daily activities and work/hobbies. After pre-op clearance, the patient was taken to the operating room on 02/03/2019 and underwent  Procedure(s): LEFT RADIAL HEAD ARTHROPLASTY.    Patient was given perioperative antibiotics:  Anti-infectives (From admission, onward)   Start     Dose/Rate Route Frequency Ordered Stop   02/03/19 2000  ceFAZolin (ANCEF) IVPB 1 g/50 mL premix     1 g 100 mL/hr over 30 Minutes Intravenous Every 8 hours 02/03/19 1336 02/04/19 0700       Patient was given sequential compression devices and early ambulation to prevent DVT.   Patient benefited maximally from hospital stay and there were no complications. At the time of discharge, the patient was urinating/moving their bowels without difficulty, tolerating a regular diet, pain is controlled with oral pain medications and they have been cleared by PT/OT.   Recent vital signs: No data found.   Recent laboratory studies: No results  for input(s): WBC, HGB, HCT, PLT, NA, K, CL, CO2, BUN, CREATININE, GLUCOSE, INR, CALCIUM in the last 72 hours.  Invalid input(s): PT, 2   Discharge Medications:   Allergies as of 02/04/2019      Reactions   Banana Hives, Itching      Medication List    STOP taking these medications   albuterol (2.5 MG/3ML) 0.083% nebulizer solution Commonly known as: PROVENTIL   azithromycin 250 MG tablet Commonly known as: Zithromax Z-Pak   meloxicam 15 MG tablet Commonly known as: MOBIC   traMADol 50 MG tablet Commonly known as: ULTRAM   zolpidem 10 MG tablet Commonly known as: AMBIEN   zolpidem 5 MG tablet Commonly known as: AMBIEN     TAKE these medications   baclofen 20 MG tablet Commonly known as: LIORESAL Take 20 mg by mouth 3 (three) times daily.   FASENRA [place] Inject 1 Dose into the skin every 28 (twenty-eight) days.   hydrOXYzine 25 MG tablet Commonly known as: ATARAX/VISTARIL Take 25 mg by mouth 3 (three) times daily as needed for anxiety.   Incruse Ellipta 62.5 MCG/INH Aepb Generic drug: umeclidinium bromide Inhale 1 puff into the lungs daily.   ipratropium 0.02 % nebulizer solution Commonly known as: ATROVENT Take 1.25 mLs (0.25 mg total) by nebulization every 4 (four) hours as needed. What changed: reasons to take this   ipratropium 17 MCG/ACT inhaler Commonly known as: ATROVENT HFA Inhale 3 puffs into the lungs daily.   losartan-hydrochlorothiazide 100-25 MG tablet Commonly known as: HYZAAR Take 1 tablet by mouth daily.  multivitamin with minerals Tabs tablet Take 1 tablet by mouth daily. Centrum Multivitamin   oxyCODONE-acetaminophen 5-325 MG tablet Commonly known as: PERCOCET/ROXICET Take 1-2 tablets by mouth every 6 (six) hours as needed for severe pain.   pantoprazole 40 MG tablet Commonly known as: Protonix Take 1 tablet (40 mg total) by mouth daily.   VITAMIN D3 PO Take 1 tablet by mouth 5 days.       Diagnostic Studies: Dg Elbow  Complete Left  Result Date: 02/03/2019 CLINICAL DATA:  Radial head arthroplasty EXAM: DG C-ARM 1-60 MIN; LEFT ELBOW - COMPLETE 3+ VIEW CONTRAST:  None FLUOROSCOPY TIME:  Fluoroscopy Time:  31 Radiation Exposure Index (if provided by the fluoroscopic device): Not provided Number of Acquired Spot Images: 1 COMPARISON:  None. FINDINGS: Radial head arthroplasty.  No fracture dislocation. IMPRESSION: No complication following radial head arthroplasty. Electronically Signed   By: Suzy Bouchard M.D.   On: 02/03/2019 14:34   Dg C-arm 1-60 Min  Result Date: 02/03/2019 CLINICAL DATA:  Radial head arthroplasty EXAM: DG C-ARM 1-60 MIN; LEFT ELBOW - COMPLETE 3+ VIEW CONTRAST:  None FLUOROSCOPY TIME:  Fluoroscopy Time:  31 Radiation Exposure Index (if provided by the fluoroscopic device): Not provided Number of Acquired Spot Images: 1 COMPARISON:  None. FINDINGS: Radial head arthroplasty.  No fracture dislocation. IMPRESSION: No complication following radial head arthroplasty. Electronically Signed   By: Suzy Bouchard M.D.   On: 02/03/2019 14:34    Discharge Instructions    Diet - low sodium heart healthy   Complete by: As directed    Increase activity slowly   Complete by: As directed       Follow-up Information    Schedule an appointment as soon as possible for a visit with [name], MD.   Specialty: Orthopedic Surgery Why: needs return office visit one week.  call to schedule.  Contact information: [place] [address] [phone]           Discharge Plan:  discharge to home  Disposition:     Signed: Benjiman Core

## 2019-02-09 ENCOUNTER — Other Ambulatory Visit: Payer: Self-pay | Admitting: Allergy & Immunology

## 2019-02-10 ENCOUNTER — Ambulatory Visit: Payer: Self-pay

## 2019-02-14 ENCOUNTER — Ambulatory Visit (INDEPENDENT_AMBULATORY_CARE_PROVIDER_SITE_OTHER): Payer: Medicare Other | Admitting: Orthopaedic Surgery

## 2019-02-14 ENCOUNTER — Encounter: Payer: Self-pay | Admitting: Orthopaedic Surgery

## 2019-02-14 ENCOUNTER — Ambulatory Visit (INDEPENDENT_AMBULATORY_CARE_PROVIDER_SITE_OTHER): Payer: Medicare Other

## 2019-02-14 VITALS — BP 158/84 | HR 76 | Ht 70.0 in | Wt 290.0 lb

## 2019-02-14 DIAGNOSIS — M25522 Pain in left elbow: Secondary | ICD-10-CM

## 2019-02-14 DIAGNOSIS — S52122S Displaced fracture of head of left radius, sequela: Secondary | ICD-10-CM

## 2019-02-14 MED ORDER — OXYCODONE-ACETAMINOPHEN 5-325 MG PO TABS: 1.0000 | ORAL_TABLET | Freq: Four times a day (QID) | ORAL | 0 refills | Status: DC | PRN

## 2019-02-14 NOTE — Progress Notes (Signed)
   Post-Op Visit Note   Patient: Patty Howard           Date of Birth: 08-10-1955           MRN: [id number] Visit Date: 02/14/2019 PCP: Sandi Mariscal, MD   Assessment & Plan: Post radial head arthroplasty.  Intra-Op she had a tiny crack in the radial neck at the last millimeter of insertion.  This is not visualized on today's x-rays.  Staples are harvested Steri-Strips applied she can work on gentle flexion extension in the elbow and then will put her arm back in her sling.  She has some swelling in her fingers and hands and we discussed working on finger flexion  Chief Complaint:  Chief Complaint  Patient presents with  . Left Elbow - Routine Post Op    02/03/2019 Left Radial Head Arthroplasty   Visit Diagnoses:  1. Pain in left elbow   2. Closed displaced fracture of head of left radius, sequela        Post radial head arthroplasty  Plan: Patient work on finger flexion elbow flexion extension gentle and then reapplied the sling.  Staples harvested Steri-Strips applied recheck 2 weeks.+ 69  Follow-Up Instructions: Return in about 2 weeks (around 02/28/2019).   Orders:  Orders Placed This Encounter  Procedures  . XR Elbow 2 Views Left   No orders of the defined types were placed in this encounter.   Imaging: Xr Elbow 2 Views Left  Result Date: 02/14/2019 2 view x-rays left elbow shows radial head arthroplasty.  Satisfactory position.  No subsidence. Impression: Left radial head arthroplasty for comminuted radial head fracture.   PMFS History: Patient Active Problem List   Diagnosis Date Noted  . Left radial head fracture 02/03/2019  . Closed fracture of radial head 01/29/2019  . Prediabetes 10/19/2018  . Hypertension, essential, benign 10/19/2018  . 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) ([place]) 10/19/2018  . Insomnia 10/19/2018  . Elbow pain, chronic, left 10/19/2018   Past Medical History:  Diagnosis Date  . Anxiety   . Arthritis   . Asthma    . Asthma   . COPD (chronic obstructive pulmonary disease) ([place])   . Fracture    closed displaced left radial head  . Hypertension   . Sleep apnea    does not wear CPAP    Family History  Problem Relation Age of Onset  . Hypertension Mother   . Stroke Mother   . Cancer Father     Past Surgical History:  Procedure Laterality Date  . BACK SURGERY     spinal stimulator  . COLONOSCOPY W/ BIOPSIES AND POLYPECTOMY    . DILATION AND CURETTAGE OF UTERUS    . LAPAROSCOPIC ROUX-EN-Y GASTRIC BYPASS WITH UPPER ENDOSCOPY AND REMOVAL OF LAP BAND    . RADIAL HEAD ARTHROPLASTY Left 02/03/2019   Procedure: LEFT RADIAL HEAD ARTHROPLASTY;  Surgeon: [name], MD;  Location: [facility];  Service: Orthopedics;  Laterality: Left;  AXILLARY BLOCK VS BIER BLOCK  . TUBAL LIGATION     Social History   Occupational History  . Not on file  Tobacco Use  . Smoking status: Former Smoker    Types: Cigarettes    Quit date: 06/15/1998    Years since quitting: 20.6  . Smokeless tobacco: Never Used  Substance and Sexual Activity  . Alcohol use: Never    Frequency: Never  . Drug use: Never  . Sexual activity: Not on file

## 2019-02-15 ENCOUNTER — Other Ambulatory Visit: Payer: Self-pay

## 2019-02-15 ENCOUNTER — Ambulatory Visit (INDEPENDENT_AMBULATORY_CARE_PROVIDER_SITE_OTHER): Payer: Medicare Other | Admitting: *Deleted

## 2019-02-15 DIAGNOSIS — J4550 Severe persistent asthma, uncomplicated: Secondary | ICD-10-CM

## 2019-02-28 ENCOUNTER — Other Ambulatory Visit: Payer: Self-pay

## 2019-02-28 ENCOUNTER — Ambulatory Visit (INDEPENDENT_AMBULATORY_CARE_PROVIDER_SITE_OTHER): Payer: Medicare Other | Admitting: Orthopaedic Surgery

## 2019-02-28 ENCOUNTER — Encounter: Payer: Self-pay | Admitting: Orthopaedic Surgery

## 2019-02-28 VITALS — BP 140/90 | HR 86 | Ht 70.0 in | Wt 290.0 lb

## 2019-02-28 DIAGNOSIS — S52122S Displaced fracture of head of left radius, sequela: Secondary | ICD-10-CM

## 2019-02-28 NOTE — Progress Notes (Signed)
   Post-Op Visit Note   Patient: Patty Howard           Date of Birth: March 06, 1956           MRN: [id number] Visit Date: 02/28/2019 PCP: Sandi Mariscal, MD   Assessment & Plan: Patient returns post left radial head arthroplasty on 02/03/2019.  Surgical treatment was delayed 15 months due to COPD respiratory failure and then COVID.  She was scheduled 2 or 3 times out-of-state at another facility and surgery was canceled.  She has improvement in her brain motion and has good pronation but lacks 50% supination.  She can reach top of her head but still needs to work on more flexion and work on some elbow extension.  I discussed with her she will not get full range of motion since she had a comminuted intra-articular fracture displaced.  She will continue to work on flexion extension and also some supination.  I will recheck her in 2 months.  Chief Complaint:  Chief Complaint  Patient presents with  . Left Elbow - Follow-up    02/03/2019 Left Radial Head Arthroplasty   Visit Diagnoses: Post radial head arthroplasty for comminuted fracture  Plan: Continue home exercise program for flexion extension as well as supination.  Recheck 2 months.  AP lateral and radial head view x-ray left elbow on return.  Follow-Up Instructions: No follow-ups on file.   Orders:  No orders of the defined types were placed in this encounter.  No orders of the defined types were placed in this encounter.   Imaging: No results found.  PMFS History: Patient Active Problem List   Diagnosis Date Noted  . Left radial head fracture 02/03/2019  . Closed fracture of radial head 01/29/2019  . Prediabetes 10/19/2018  . Hypertension, essential, benign 10/19/2018  . 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) ([place]) 10/19/2018  . Insomnia 10/19/2018  . Elbow pain, chronic, left 10/19/2018   Past Medical History:  Diagnosis Date  . Anxiety   . Arthritis   . Asthma   . Asthma   . COPD (chronic  obstructive pulmonary disease) ([place])   . Fracture    closed displaced left radial head  . Hypertension   . Sleep apnea    does not wear CPAP    Family History  Problem Relation Age of Onset  . Hypertension Mother   . Stroke Mother   . Cancer Father     Past Surgical History:  Procedure Laterality Date  . BACK SURGERY     spinal stimulator  . COLONOSCOPY W/ BIOPSIES AND POLYPECTOMY    . DILATION AND CURETTAGE OF UTERUS    . LAPAROSCOPIC ROUX-EN-Y GASTRIC BYPASS WITH UPPER ENDOSCOPY AND REMOVAL OF LAP BAND    . RADIAL HEAD ARTHROPLASTY Left 02/03/2019   Procedure: LEFT RADIAL HEAD ARTHROPLASTY;  Surgeon: [name], MD;  Location: [facility];  Service: Orthopedics;  Laterality: Left;  AXILLARY BLOCK VS BIER BLOCK  . TUBAL LIGATION     Social History   Occupational History  . Not on file  Tobacco Use  . Smoking status: Former Smoker    Types: Cigarettes    Quit date: 06/15/1998    Years since quitting: 20.7  . Smokeless tobacco: Never Used  Substance and Sexual Activity  . Alcohol use: Never    Frequency: Never  . Drug use: Never  . Sexual activity: Not on file

## 2019-03-20 ENCOUNTER — Other Ambulatory Visit: Payer: Self-pay | Admitting: Allergy & Immunology

## 2019-03-24 ENCOUNTER — Ambulatory Visit: Payer: Medicare Other | Admitting: Family Medicine

## 2019-04-05 ENCOUNTER — Encounter: Payer: Self-pay | Admitting: Pulmonary Disease

## 2019-04-05 ENCOUNTER — Other Ambulatory Visit: Payer: Self-pay

## 2019-04-05 ENCOUNTER — Ambulatory Visit (INDEPENDENT_AMBULATORY_CARE_PROVIDER_SITE_OTHER): Payer: Medicare Other | Admitting: Pulmonary Disease

## 2019-04-05 VITALS — BP 138/74 | HR 109 | Ht 70.0 in | Wt 294.0 lb

## 2019-04-05 DIAGNOSIS — G4733 Obstructive sleep apnea (adult) (pediatric): Secondary | ICD-10-CM

## 2019-04-05 NOTE — Progress Notes (Signed)
Subjective:    Patient ID: Patty Howard, female    DOB: June 24, 1955, 63 y.o.   MRN: [id number]  Patient being seen for obstructive sleep apnea  Diagnosed with obstructive sleep apnea about 8 to 10 years ago Her machine became dysfunctional about a year ago  History of obstructive sleep apnea  Sleep time varies significantly sometimes midnight to 3 AM Takes about 30 minutes to fall asleep Almost 4 nocturnal awakenings on her regular basis Final awakening time about 7 AM  Weight has been stable  Has a history of asthma/COPD, history of hypertension  Admits to dryness of her mouth in the mornings Admits to choking episodes at night No headaches in the mornings  Sister with OSA  Her memory is fine  Past Medical History:  Diagnosis Date  . Anxiety   . Arthritis   . Asthma   . Asthma   . COPD (chronic obstructive pulmonary disease) ([facility])   . Fracture    closed displaced left radial head  . Hypertension   . Sleep apnea    does not wear CPAP   Social History   Socioeconomic History  . Marital status: Widowed    Spouse name: Not on file  . Number of children: Not on file  . Years of education: Not on file  . Highest education level: Not on file  Occupational History  . Not on file  Social Needs  . Financial resource strain: Not on file  . Food insecurity    Worry: Not on file    Inability: Not on file  . Transportation needs    Medical: Not on file    Non-medical: Not on file  Tobacco Use  . Smoking status: Former Smoker    Types: Cigarettes    Quit date: 06/15/1998    Years since quitting: 20.8  . Smokeless tobacco: Never Used  Substance and Sexual Activity  . Alcohol use: Never    Frequency: Never  . Drug use: Never  . Sexual activity: Not on file  Lifestyle  . Physical activity    Days per week: Not on file    Minutes per session: Not on file  . Stress: Not on file  Relationships  . Social Herbalist on phone: Not on file   Gets together: Not on file    Attends religious service: Not on file    Active member of club or organization: Not on file    Attends meetings of clubs or organizations: Not on file    Relationship status: Not on file  . Intimate partner violence    Fear of current or ex partner: Not on file    Emotionally abused: Not on file    Physically abused: Not on file    Forced sexual activity: Not on file  Other Topics Concern  . Not on file  Social History Narrative  . Not on file   Family History  Problem Relation Age of Onset  . Hypertension Mother   . Stroke Mother   . Cancer Father     Review of Systems  Constitutional: Negative for fever and unexpected weight change.  HENT: Positive for congestion. Negative for dental problem, ear pain, nosebleeds, postnasal drip, rhinorrhea, sinus pressure, sneezing, sore throat and trouble swallowing.   Eyes: Negative for redness and itching.  Respiratory: Positive for cough, shortness of breath and wheezing. Negative for chest tightness.   Cardiovascular: Negative for palpitations and leg swelling.  Gastrointestinal: Negative for nausea  and vomiting.  Genitourinary: Negative for dysuria.  Musculoskeletal: Negative for joint swelling.  Skin: Negative for rash.  Allergic/Immunologic: Positive for environmental allergies and food allergies. Negative for immunocompromised state.  Neurological: Negative for headaches.  Hematological: Does not bruise/bleed easily.  Psychiatric/Behavioral: Negative for dysphoric mood. The patient is nervous/anxious.        Objective:   Physical Exam Vitals:   04/05/19 1441  BP: 138/74  Pulse: (!) 109  SpO2: 96%   Results of the Epworth flowsheet 04/05/2019  Sitting and reading 3  Watching TV 3  Sitting, inactive in a public place (e.g. a theatre or a meeting) 3  As a passenger in a car for an hour without a break 3  Lying down to rest in the afternoon when circumstances permit 3  Sitting and talking to  someone 3  Sitting quietly after a lunch without alcohol 3  In a car, while stopped for a few minutes in traffic 3  Total score 24   Previous study not available to me at present     Assessment & Plan:  History of obstructive sleep apnea .  Severity is unclear, may have been severe .  She does have excessive daytime symptoms in terms .  Significant symptoms at night suggesting presence of obstructive sleep apnea  Excessive daytime sleepiness .  Epworth of 24  Morbid obesity .  She is working on weight loss .  Recently joined a gym  COPD/asthma .  Completing course of steroids .  Symptoms usually fairly well controlled  Plan: We will schedule a home sleep study  Treatment options discussed with the patient  Importance of weight management discussed with the patient  I will see her back in the office in about 3 months

## 2019-04-05 NOTE — Patient Instructions (Signed)
History of obstructive sleep apnea  We will obtain a home sleep study Contact medical supply company following reviewing the study and start you on CPAP therapy  I will see you back in the office in about 3 months  Call with any concerns   Sleep Apnea Sleep apnea is a condition in which breathing pauses or becomes shallow during sleep. Episodes of sleep apnea usually last 10 seconds or longer, and they may occur as many as 20 times an hour. Sleep apnea disrupts your sleep and keeps your body from getting the rest that it needs. This condition can increase your risk of certain health problems, including:  Heart attack.  Stroke.  Obesity.  Diabetes.  Heart failure.  Irregular heartbeat. What are the causes? There are three kinds of sleep apnea:  Obstructive sleep apnea. This kind is caused by a blocked or collapsed airway.  Central sleep apnea. This kind happens when the part of the brain that controls breathing does not send the correct signals to the muscles that control breathing.  Mixed sleep apnea. This is a combination of obstructive and central sleep apnea. The most common cause of this condition is a collapsed or blocked airway. An airway can collapse or become blocked if:  Your throat muscles are abnormally relaxed.  Your tongue and tonsils are larger than normal.  You are overweight.  Your airway is smaller than normal. What increases the risk? You are more likely to develop this condition if you:  Are overweight.  Smoke.  Have a smaller than normal airway.  Are elderly.  Are female.  Drink alcohol.  Take sedatives or tranquilizers.  Have a family history of sleep apnea. What are the signs or symptoms? Symptoms of this condition include:  Trouble staying asleep.  Daytime sleepiness and tiredness.  Irritability.  Loud snoring.  Morning headaches.  Trouble concentrating.  Forgetfulness.  Decreased interest in sex.  Unexplained  sleepiness.  Mood swings.  Personality changes.  Feelings of depression.  Waking up often during the night to urinate.  Dry mouth.  Sore throat. How is this diagnosed? This condition may be diagnosed with:  A medical history.  A physical exam.  A series of tests that are done while you are sleeping (sleep study). These tests are usually done in a sleep lab, but they may also be done at home. How is this treated? Treatment for this condition aims to restore normal breathing and to ease symptoms during sleep. It may involve managing health issues that can affect breathing, such as high blood pressure or obesity. Treatment may include:  Sleeping on your side.  Using a decongestant if you have nasal congestion.  Avoiding the use of depressants, including alcohol, sedatives, and narcotics.  Losing weight if you are overweight.  Making changes to your diet.  Quitting smoking.  Using a device to open your airway while you sleep, such as: ? An oral appliance. This is a custom-made mouthpiece that shifts your lower jaw forward. ? A continuous positive airway pressure (CPAP) device. This device blows air through a mask when you breathe out (exhale). ? A nasal expiratory positive airway pressure (EPAP) device. This device has valves that you put into each nostril. ? A bi-level positive airway pressure (BPAP) device. This device blows air through a mask when you breathe in (inhale) and breathe out (exhale).  Having surgery if other treatments do not work. During surgery, excess tissue is removed to create a wider airway. It is important to  get treatment for sleep apnea. Without treatment, this condition can lead to:  High blood pressure.  Coronary artery disease.  In men, an inability to achieve or maintain an erection (impotence).  Reduced thinking abilities. Follow these instructions at home: Lifestyle  Make any lifestyle changes that your health care provider recommends.   Eat a healthy, well-balanced diet.  Take steps to lose weight if you are overweight.  Avoid using depressants, including alcohol, sedatives, and narcotics.  Do not use any products that contain nicotine or tobacco, such as cigarettes, e-cigarettes, and chewing tobacco. If you need help quitting, ask your health care provider. General instructions  Take over-the-counter and prescription medicines only as told by your health care provider.  If you were given a device to open your airway while you sleep, use it only as told by your health care provider.  If you are having surgery, make sure to tell your health care provider you have sleep apnea. You may need to bring your device with you.  Keep all follow-up visits as told by your health care provider. This is important. Contact a health care provider if:  The device that you received to open your airway during sleep is uncomfortable or does not seem to be working.  Your symptoms do not improve.  Your symptoms get worse. Get help right away if:  You develop: ? Chest pain. ? Shortness of breath. ? Discomfort in your back, arms, or stomach.  You have: ? Trouble speaking. ? Weakness on one side of your body. ? Drooping in your face. These symptoms may represent a serious problem that is an emergency. Do not wait to see if the symptoms will go away. Get medical help right away. Call your local emergency services (911 in the [place] Do not drive yourself to the hospital. Summary  Sleep apnea is a condition in which breathing pauses or becomes shallow during sleep.  The most common cause is a collapsed or blocked airway.  The goal of treatment is to restore normal breathing and to ease symptoms during sleep. This information is not intended to replace advice given to you by your health care provider. Make sure you discuss any questions you have with your health care provider. Document Released: 05/22/2002 Document Revised:  03/18/2018 Document Reviewed: 01/25/2018 Elsevier Patient Education  2020 [employer].

## 2019-04-12 ENCOUNTER — Ambulatory Visit (INDEPENDENT_AMBULATORY_CARE_PROVIDER_SITE_OTHER): Payer: Medicare Other

## 2019-04-12 ENCOUNTER — Other Ambulatory Visit: Payer: Self-pay

## 2019-04-12 DIAGNOSIS — J4550 Severe persistent asthma, uncomplicated: Secondary | ICD-10-CM | POA: Diagnosis not present

## 2019-04-12 MED ORDER — BENRALIZUMAB 30 MG/ML ~~LOC~~ SOSY
30.0000 mg | PREFILLED_SYRINGE | SUBCUTANEOUS | Status: DC
  Administered 2019-04-12 – 2021-11-27 (×18): 30 mg via SUBCUTANEOUS

## 2019-04-23 ENCOUNTER — Other Ambulatory Visit: Payer: Self-pay | Admitting: Allergy & Immunology

## 2019-04-27 ENCOUNTER — Ambulatory Visit (INDEPENDENT_AMBULATORY_CARE_PROVIDER_SITE_OTHER): Payer: Medicare Other | Admitting: Allergy & Immunology

## 2019-04-27 ENCOUNTER — Encounter: Payer: Self-pay | Admitting: Allergy & Immunology

## 2019-04-27 ENCOUNTER — Other Ambulatory Visit: Payer: Self-pay

## 2019-04-27 VITALS — BP 150/80 | HR 95 | Temp 97.8°F | Resp 18 | Ht 70.0 in | Wt 294.4 lb

## 2019-04-27 DIAGNOSIS — J4550 Severe persistent asthma, uncomplicated: Secondary | ICD-10-CM

## 2019-04-27 DIAGNOSIS — K219 Gastro-esophageal reflux disease without esophagitis: Secondary | ICD-10-CM | POA: Diagnosis not present

## 2019-04-27 DIAGNOSIS — J310 Chronic rhinitis: Secondary | ICD-10-CM

## 2019-04-27 MED ORDER — FLUTICASONE PROPIONATE 50 MCG/ACT NA SUSP: 2.0000 | Freq: Every day | NASAL | 1 refills | Status: DC | PRN

## 2019-04-27 MED ORDER — YUPELRI 175 MCG/3ML IN SOLN: 3.0000 mL | Freq: Every day | RESPIRATORY_TRACT | 5 refills | Status: DC

## 2019-04-27 MED ORDER — PERFOROMIST 20 MCG/2ML IN NEBU: 20.0000 ug | INHALATION_SOLUTION | Freq: Two times a day (BID) | RESPIRATORY_TRACT | 5 refills | Status: DC

## 2019-04-27 MED ORDER — ALBUTEROL SULFATE HFA 108 (90 BASE) MCG/ACT IN AERS: 1.0000 | INHALATION_SPRAY | Freq: Four times a day (QID) | RESPIRATORY_TRACT | 0 refills | Status: DC | PRN

## 2019-04-27 MED ORDER — DOXYCYCLINE MONOHYDRATE 100 MG PO CAPS: 100.0000 mg | ORAL_CAPSULE | Freq: Two times a day (BID) | ORAL | 0 refills | Status: AC

## 2019-04-27 MED ORDER — BUDESONIDE 0.5 MG/2ML IN SUSP: 0.5000 mg | Freq: Two times a day (BID) | RESPIRATORY_TRACT | 5 refills | Status: DC

## 2019-04-27 NOTE — Patient Instructions (Addendum)
1. Severe persistent asthma, uncomplicated - Lung testing was slightly better today than last time, although clearly not normal. - We are going to change you to nebulized medications instead of inhalers.  - Daily controller medication(s): Pulmicort 0.93m (inhaled steroid) TWO TIMES DAILY + Perforomist 24m TWO TIMES DAILY + Yupelri ONE TIME DAILY + Fasenra every 8 weeks  - Prior to physical activity: albuterol 2 puffs 10-15 minutes before physical activity. - Rescue medications: albuterol 4 puffs every 4-6 hours as needed, albuterol nebulizer one vial every 4-6 hours as needed or DuoNeb nebulizer one vial every 4-6 hours as needed - Asthma control goals:  * Full participation in all desired activities (may need albuterol before activity) * Albuterol use two time or less a week on average (not counting use with activity) * Cough interfering with sleep two time or less a month * Oral steroids no more than once a year * No hospitalizations  2. Chronic rhinitis (dust mites, molds) - Start Xyzal (levocetirizine) 4m18maily in place of Zyrtec (cetirzine)  - Continue with Flonase one spray per nostril twice daily to control any postnasal drip.  - Add on Mucinex 600-1200m41mice daily. - Add on doxycycline 100mg27mce daily for two weeks.   3. Gastroesophageal reflux disease - Continue with Protonix 40mg 30m daily to control any silent reflux.  4. Return in about 2 months (around 06/27/2019). This can be an in-person, a virtual Webex or a telephone follow up visit.   Please inform us of [facility] visits, hospitalizations, or changes in symptoms. Call us bef[place]ae going to the ED for breathing or allergy symptoms since we might be able to fit you in for a sick visit. Feel free to contact us any[place] with any questions, problems, or concerns.  It was a pleasure to see you again today!  Websites that have reliable patient information: 1. American Academy of Asthma, Allergy, and  Immunology: www.aaaai.org 2. Food Allergy Research and Education (FARE): foodallergy.org 3. Mothers of Asthmatics: http://www.asthmacommunitynetwork.org 4. American College of Allergy, Asthma, and Immunology: www.acaai.org  "Like" us on [place]acebook and Instagram for our latest updates!      Make sure you are registered to vote! If you have moved or changed any of your contact information, you will need to get this updated before voting!  In some cases, you MAY be able to register to vote online: [email or web address]

## 2019-04-27 NOTE — Progress Notes (Signed)
FOLLOW UP  Date of Service/Encounter:  04/27/19   Assessment:   Severe persistent asthma, uncomplicated  Perennial and seasonal allergic rhinitis  Gastroesophageal reflux disease  Morbid obesity  Plan/Recommendations:   1. Severe persistent asthma, uncomplicated - Lung testing was slightly better today than last time, although clearly not normal. - We are going to change you to nebulized medications instead of inhalers.  - Daily controller medication(s): Pulmicort 0.69m (inhaled steroid) TWO TIMES DAILY + Perforomist 249m TWO TIMES DAILY + Yupelri ONE TIME DAILY + Fasenra every 8 weeks  - Prior to physical activity: albuterol 2 puffs 10-15 minutes before physical activity. - Rescue medications: albuterol 4 puffs every 4-6 hours as needed, albuterol nebulizer one vial every 4-6 hours as needed or DuoNeb nebulizer one vial every 4-6 hours as needed - Asthma control goals:  * Full participation in all desired activities (may need albuterol before activity) * Albuterol use two time or less a week on average (not counting use with activity) * Cough interfering with sleep two time or less a month * Oral steroids no more than once a year * No hospitalizations  2. Chronic rhinitis (dust mites, molds) - Start Xyzal (levocetirizine) 25m59maily in place of Zyrtec (cetirzine)  - Continue with Flonase one spray per nostril twice daily to control any postnasal drip.  - Add on Mucinex 600-1200m60mice daily. - Add on doxycycline 100mg36mce daily for two weeks.   3. Gastroesophageal reflux disease - Continue with Protonix 40mg 64m daily to control any silent reflux.  4. Return in about 2 months (around 06/27/2019). This can be an in-person, a virtual Webex or a telephone follow up visit.     Subjective:   Patty Parran63 y.o42female presenting today for follow up of  Chief Complaint  Patient presents with  . Asthma    Patty Howard history of the  following: Patient Active Problem List   Diagnosis Date Noted  . Left radial head fracture 02/03/2019  . Closed fracture of radial head 01/29/2019  . Prediabetes 10/19/2018  . Hypertension, essential, benign 10/19/2018  . 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 Insomnia 10/19/2018  . Elbow pain, chronic, left 10/19/2018  . Asthma 05/29/2018  . COPD exacerbation (HCC) 1[place]2/2019  . H/O Spinal surgery 01/18/2015  . HTN (hypertension) 01/18/2015  . Obesity 01/18/2015    History obtained from: chart review and patient.  [name]63 y.o76female presenting for a follow up visit.  I last saw her in June 2020.  She was establishing care with us.  [place] lung testing was around 50% of normal so we did not do  any testing.  We did add on Fasenra to her regimen.  We continue with Incruse 1 puff once daily and Breo 200/25 mcg 1 puff once daily.  She has albuterol to use as needed.  For her rhinitis, we changed her from Zyrtec to Xyzal.  We also added Flonase 1 spray per nostril twice daily.  We started her on Protonix for her reflux.  I was hoping this would help with her breathing.  In the interim, she received her first dose of Fasenra in June 6.  She has been receiving it regularly since that time.  Her labs showed sensitization to dust mite, Aspergillus, and Alternaria.  Alpha 1 antitrypsin level was normal.  Aspergillus precipitins were normal, ruling out ABPA. She is a history of a smoker (quite smoking  around 20 years ago, but started smoking in her teen years). She was followed by a Pulmonologist in [place] and then another Pulmonologist in [place] for nearly 15 years. We are going to get some records from that Pulmonologist today.   Since the last visit, she has not done well. She has required two rounds of prednisone. She ran out of [place] and Incruse two days ago. Her medications cover her medications really well. She has been on Breo for two years. Advair  and Symbicort have never done anything for her. She is using her nebulizer four times daily and when she wakes up in the morning. During the afternoon, she will need it. She has been on the albuterol neb 3-4 times daily since I saw her.   She did have an ED visit in July for a URI. She did not get steroids at that time but she did have antibiotics. She has had two courses of prednisone in October 2020. She did get a steroid injection during October as well as the two courses mentioned above. Otherwise she has done well since I saw her in June 2020. In retrospect, this is actually fairly good for her, as she typically needed steroids 1-2 times per month.    Full PFTs (February 2020) FRC Pre: 2.67L FRC Pre % Predicted: 88% FRC Predicted: 3.05L TLC Pre: 4.30L TLC Pre % Predicted: 72% TLC Predicted: 5.94L RV Pre: 2.18L RV Pre % Predicted: 96% RV Predicted: 2.26L RV/TLC Pre: 51% RV/TLC Pre % Predicted: 124% RV/TLC Predicted: 41% VC Pre: 2.12L VC Pre % Predicted: 65% VC Predicted: 3.24L RAW Pre: 3.14L/s/cmH2O RAW Pre % Predicted: 185% RAW Predicted: 1.70L/s/cmH2O SGAW Pre: 0.13L/s/cmH2O SGAW Pre % Predicted: 50% SGAW Predicted: 0.26L/s/cmH2O Spirometry reveals reduced FEV1 and FEV1/FVC ratio consistent with MODERATE OBSTRUCTIVE DEFECT; decreased FVC consistent with AIR TRAPPING OR ADDITIONAL RESTRICTIVE PROCESS.  Significant response to inhaled bronchodilators.  Improvement of FVC after inhaled bronchodilators suggests AIR TRAPPING.  Flow volume loop consistent with expiratory obstructive defect with normal inspiratory limb.  Lung volumes reveal decreased TLC consistent with RESTRICTIVE DEFECT, but decreased VC and increased RV consistent with AIR TRAPPING.  DLCO is NORMAL   Chest CT (January 2020)  Airway: The trachea and bronchi have normal appearance. There is no evidence of intraluminal mass.  Parenchyma and pleura: There is no pulmonary nodule or mass. There is no consolidation.  There is mild basilar atelectasis. There are no pleural effusions.  Thoracic aorta and great vessels: The thoracic aorta is normal in caliber with no evidence of aneurysm. There is no evidence of aortic dissection.  Pulmonary arteries: Normal.  Heart and pericardium: Normal.  Lymph nodes: There is no mediastinal, hilar or axillary lymphadenopathy.  Thoracic spine: Normal.  Chest wall: Normal.  CT OF THE ABDOMEN AND PELVIS:  Liver: The liver is normal in size and attenuation. There are no focal hepatic lesions.   Gallbladder and biliary system: The gallbladder and biliary system have normal appearance.   Pancreas: The pancreas is normal in appearance with no focal lesion. Pancreatic duct has a normal appearance.  Spleen: The spleen is normal in size and attenuation.  Kidneys: Both kidneys are normal in size and attenuation. There is no evidence of nephrolithiasis or hydronephrosis. There is no evidence of mass.  Adrenal glands: Both adrenal glands are normal in size.  Gastrointestinal tract: The bowel is normal in caliber. There is no evidence of obstruction or pneumoperitoneum. There is no evidence of mass. The patient is status  post adjustable lap band surgery.  Vasculature: The abdominal aorta is normal in size with no evidence of aneurysm. There is no evidence of aortic dissection. Both common iliac arteries are non-aneurysmal. The proximal superior mesenteric artery and celiac axis are patent.  Lymph nodes: There is no abdominal or retroperitoneal adenopathy. The mesentery has a normal appearance.  Pelvic structures: There is no evidence of pelvic mass. There is no pelvic lymphadenopathy. There is no free fluid within the pelvis.   Body wall and musculoskeletal: There is a small fat-containing umbilical hernia. There are moderate degenerative changes of the lower lumbar spine.  Otherwise, there have been no changes to her past medical history, surgical  history, family history, or social history.    Review of Systems  Constitutional: Negative.  Negative for fever, malaise/fatigue and weight loss.  HENT: Negative.  Negative for congestion, ear discharge and ear pain.   Eyes: Negative for pain, discharge and redness.  Respiratory: Positive for shortness of breath and wheezing. Negative for cough and sputum production.   Cardiovascular: Negative.  Negative for chest pain and palpitations.  Gastrointestinal: Negative for abdominal pain, constipation, diarrhea, heartburn, nausea and vomiting.  Skin: Negative.  Negative for itching and rash.  Neurological: Negative for dizziness and headaches.  Endo/Heme/Allergies: Negative for environmental allergies. Does not bruise/bleed easily.       Objective:   Blood pressure (!) 150/80, pulse 95, temperature 97.8 F (36.6 C), temperature source Temporal, resp. rate 18, height 5' 10"  (1.778 m), weight 294 lb 6.4 oz (133.5 kg), SpO2 97 %. Body mass index is 42.24 kg/m.   Physical Exam:  Physical Exam  Constitutional: She appears well-developed.  Obese female.  Cooperative with the exam.  Very appreciative.  HENT:  Head: Normocephalic and atraumatic.  Right Ear: Tympanic membrane, external ear and ear canal normal.  Left Ear: Tympanic membrane, external ear and ear canal normal.  Nose: Mucosal edema and rhinorrhea present. No nasal deformity or septal deviation. No epistaxis. Right sinus exhibits no maxillary sinus tenderness and no frontal sinus tenderness. Left sinus exhibits no maxillary sinus tenderness and no frontal sinus tenderness.  Mouth/Throat: Uvula is midline and oropharynx is clear and moist. Mucous membranes are not pale and not dry.  There is mild cobblestoning in the posterior oropharynx.  Tonsils are normal bilaterally.  Eyes: Pupils are equal, round, and reactive to light. Conjunctivae and EOM are normal. Right eye exhibits no chemosis and no discharge. Left eye exhibits no  chemosis and no discharge. Right conjunctiva is not injected. Left conjunctiva is not injected.  Cardiovascular: Normal rate, regular rhythm and normal heart sounds.  Respiratory: Effort normal and breath sounds normal. No accessory muscle usage. No tachypnea. No respiratory distress. She has no wheezes. She has no rhonchi. She has no rales. She exhibits no tenderness.  Moving air well in all lung fields.  No crackles or wheezes.  Coarse rhonchi throughout.  Lymphadenopathy:    She has no cervical adenopathy.  Neurological: She is alert.  Skin: No abrasion, no petechiae and no rash noted. Rash is not papular, not vesicular and not urticarial. No erythema. No pallor.  No eczematous or urticarial lesions noted. No increased work of breathing noted.   Psychiatric: She has a normal mood and affect.     Diagnostic studies:    Spirometry: results abnormal (FEV1: 1.33/52%, FVC: 1.91/59%, FEV1/FVC: 70%).    Spirometry consistent with possible restrictive disease.   Allergy Studies: none      Salvatore Marvel, MD  [facility]

## 2019-05-02 ENCOUNTER — Encounter: Payer: Self-pay | Admitting: Orthopaedic Surgery

## 2019-05-02 ENCOUNTER — Ambulatory Visit (INDEPENDENT_AMBULATORY_CARE_PROVIDER_SITE_OTHER): Payer: Medicare Other | Admitting: Orthopaedic Surgery

## 2019-05-02 ENCOUNTER — Other Ambulatory Visit: Payer: Self-pay

## 2019-05-02 ENCOUNTER — Ambulatory Visit (INDEPENDENT_AMBULATORY_CARE_PROVIDER_SITE_OTHER): Payer: Medicare Other

## 2019-05-02 VITALS — BP 164/108 | HR 99 | Ht 70.0 in | Wt 294.0 lb

## 2019-05-02 DIAGNOSIS — S52122S Displaced fracture of head of left radius, sequela: Secondary | ICD-10-CM | POA: Diagnosis not present

## 2019-05-02 NOTE — Progress Notes (Signed)
63 year old black female who is status post left radial head arthroplasty February 03, 2019 returns.  States that she is doing well.  Making good progress with PT.  Continues have some soreness as to be expected but getting better.  Exam She just about gets to full extension.  Lacks about 10 to 15 degrees full flexion.  Also lacks a few degrees with supination and pronation.  She continues to be tender over the surgical site.  Neurovascular intact.  Plan: Dr Lorin Mercy advised patient that she can stop formal PT.  Continue HEP.  F/u prn.  Call if any questions or concerns.

## 2019-05-05 ENCOUNTER — Telehealth: Payer: Self-pay | Admitting: Allergy & Immunology

## 2019-05-05 NOTE — Telephone Encounter (Signed)
PA for Yupelri and Perforomist submitted via covermymeds. Currently awaiting approval.denial.

## 2019-05-05 NOTE — Telephone Encounter (Signed)
Called and informed patient we have a few samples that we have placed up front for her to pick up. Patient will be in Monday morning to get them.

## 2019-05-05 NOTE — Telephone Encounter (Signed)
Patient called stating that she is having trouble getting the insurance company to fill her prescriptions for Yupelri and Perforomist and would like to know if she would be able to get more samples of these two. Patient states that they are working well for her.   Please advise.

## 2019-05-08 NOTE — Telephone Encounter (Signed)
Prescriptions have been sent with the diagnosis codes.

## 2019-05-08 NOTE — Telephone Encounter (Signed)
Per insurance coverage determination her insurance does cover [place] under Part D medicare.

## 2019-05-15 ENCOUNTER — Other Ambulatory Visit: Payer: Self-pay

## 2019-05-15 ENCOUNTER — Emergency Department (HOSPITAL_COMMUNITY): Payer: Medicare Other

## 2019-05-15 ENCOUNTER — Encounter (HOSPITAL_COMMUNITY): Payer: Self-pay | Admitting: Emergency Medicine

## 2019-05-15 ENCOUNTER — Emergency Department (HOSPITAL_COMMUNITY)
Admission: EM | Admit: 2019-05-15 | Discharge: 2019-05-15 | Disposition: A | Discharge status: Home / Self Care | Payer: Medicare Other | Attending: Emergency Medicine | Admitting: Emergency Medicine

## 2019-05-15 DIAGNOSIS — Z79899 Other long term (current) drug therapy: Secondary | ICD-10-CM | POA: Diagnosis not present

## 2019-05-15 DIAGNOSIS — I10 Essential (primary) hypertension: Secondary | ICD-10-CM | POA: Insufficient documentation

## 2019-05-15 DIAGNOSIS — J441 Chronic obstructive pulmonary disease with (acute) exacerbation: Secondary | ICD-10-CM

## 2019-05-15 DIAGNOSIS — R0602 Shortness of breath: Secondary | ICD-10-CM | POA: Diagnosis present

## 2019-05-15 DIAGNOSIS — Z87891 Personal history of nicotine dependence: Secondary | ICD-10-CM | POA: Insufficient documentation

## 2019-05-15 DIAGNOSIS — Z20828 Contact with and (suspected) exposure to other viral communicable diseases: Secondary | ICD-10-CM | POA: Insufficient documentation

## 2019-05-15 LAB — POC SARS CORONAVIRUS 2 AG -  ED: SARS Coronavirus 2 Ag: NEGATIVE

## 2019-05-15 MED ORDER — AZITHROMYCIN 250 MG PO TABS: 250.0000 mg | ORAL_TABLET | Freq: Every day | ORAL | 0 refills | Status: DC

## 2019-05-15 MED ORDER — PREDNISONE 20 MG PO TABS: ORAL_TABLET | ORAL | 0 refills | Status: DC

## 2019-05-15 MED ORDER — PREDNISONE 20 MG PO TABS
60.0000 mg | ORAL_TABLET | Freq: Once | ORAL | Status: AC
  Administered 2019-05-15: 60 mg via ORAL
  Filled 2019-05-15: qty 3

## 2019-05-15 MED ORDER — ALBUTEROL SULFATE HFA 108 (90 BASE) MCG/ACT IN AERS
8.0000 | INHALATION_SPRAY | Freq: Once | RESPIRATORY_TRACT | Status: AC
  Administered 2019-05-15: 8 via RESPIRATORY_TRACT
  Filled 2019-05-15: qty 6.7

## 2019-05-15 NOTE — Discharge Instructions (Signed)
Use your albuterol (inhaler or nebulizer) every 4 hours for the next 48 hours. If you need it significantly more than this then return to the ER or see your doctor.   You are being prescribed antibiotics which you can start today as well as steroids which you should start tomorrow since you were given your first dose here.  You are being tested for the novel coronavirus, please isolate/quarantine yourself until these results have returned.

## 2019-05-15 NOTE — ED Triage Notes (Addendum)
Per EMS pt complaint of worsening SOB and cough; hx of asthma. Denies other.  Pt verbalizes "the mucus is so thick it won't come out of my throat."

## 2019-05-15 NOTE — ED Provider Notes (Signed)
[facility] DEPT Provider Note   CSN: [id number] Arrival date & time: 05/15/19  0800     History   Chief Complaint Chief Complaint  Patient presents with  . Shortness of Breath    HPI Patty Howard is a 63 y.o. female.     HPI  63 year old female presents with cough and shortness of breath.  Has been ongoing for about 5 days.  Has had yellow/green sputum.  No fevers.  She denies sore throat but feels like the sputum gets stuck in her throat.  No chest pain.  She has been using her albuterol inhaler and nebulizer without any relief.  No known sick contacts. No leg swelling.  Past Medical History:  Diagnosis Date  . Anxiety   . Arthritis   . Asthma   . Asthma   . COPD (chronic obstructive pulmonary disease) ([place])   . Fracture    closed displaced left radial head  . Hypertension   . Sleep apnea    does not wear CPAP    Patient Active Problem List   Diagnosis Date Noted  . Left radial head fracture 02/03/2019  . Closed fracture of radial head 01/29/2019  . Prediabetes 10/19/2018  . Hypertension, essential, benign 10/19/2018  . 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) ([place]) 10/19/2018  . Insomnia 10/19/2018  . Elbow pain, chronic, left 10/19/2018  . Asthma 05/29/2018  . COPD exacerbation ([place]) 04/05/2018  . H/O Spinal surgery 01/18/2015  . HTN (hypertension) 01/18/2015  . Obesity 01/18/2015    Past Surgical History:  Procedure Laterality Date  . BACK SURGERY     spinal stimulator  . COLONOSCOPY W/ BIOPSIES AND POLYPECTOMY    . DILATION AND CURETTAGE OF UTERUS    . LAPAROSCOPIC ROUX-EN-Y GASTRIC BYPASS WITH UPPER ENDOSCOPY AND REMOVAL OF LAP BAND    . RADIAL HEAD ARTHROPLASTY Left 02/03/2019   Procedure: LEFT RADIAL HEAD ARTHROPLASTY;  Surgeon: [name], MD;  Location: [place] [facility];  Service: Orthopedics;  Laterality: Left;  AXILLARY BLOCK VS BIER BLOCK  . TUBAL LIGATION       OB History   No obstetric  history on file.      Home Medications    Prior to Admission medications   Medication Sig Start Date End Date Taking? Authorizing Provider  albuterol (VENTOLIN HFA) 108 (90 Base) MCG/ACT inhaler Inhale 1-2 puffs into the lungs every 6 (six) hours as needed for wheezing or shortness of breath. 04/27/19   Valentina Shaggy, MD  azithromycin (ZITHROMAX) 250 MG tablet Take 1 tablet (250 mg total) by mouth daily. Take first 2 tablets together, then 1 every day until finished. 05/15/19   Sherwood Gambler, MD  baclofen (LIORESAL) 20 MG tablet Take 20 mg by mouth 3 (three) times daily. 12/05/18   [provider]  Benralizumab (FASENRA [place]) Inject 1 Dose into the skin every 8 (eight) weeks.     [provider]  budesonide (PULMICORT) 0.5 MG/2ML nebulizer solution Take 2 mLs (0.5 mg total) by nebulization 2 (two) times daily. 04/27/19   Valentina Shaggy, MD  Cholecalciferol (VITAMIN D3 PO) Take 1 tablet by mouth 5 days.    [provider]  fluticasone (FLONASE) 50 MCG/ACT nasal spray Place 2 sprays into both nostrils daily as needed for allergies. 04/27/19   Valentina Shaggy, MD  formoterol (PERFOROMIST) 20 MCG/2ML nebulizer solution Take 2 mLs (20 mcg total) by nebulization 2 (two) times daily. 04/27/19   Valentina Shaggy, MD  hydrOXYzine (ATARAX/VISTARIL) 25 MG tablet Take 25 mg by mouth 3 (three) times daily as needed for anxiety.    [provider]  ipratropium (ATROVENT HFA) 17 MCG/ACT inhaler Inhale 3 puffs into the lungs daily.    [provider]  ipratropium (ATROVENT) 0.02 % nebulizer solution Take 1.25 mLs (0.25 mg total) by nebulization every 4 (four) hours as needed. Patient taking differently: Take 0.25 mg by nebulization every 4 (four) hours as needed for wheezing or shortness of breath.  12/20/18   Valentina Shaggy, MD  ipratropium-albuterol (DUONEB) 0.5-2.5 (3) MG/3ML SOLN INHALE 1 VIAL BY NEBULIZER EVERY 6 HOURS AS NEEDED FOR  SHORTNESS OF BREATH AND WHEEZING 04/24/19   Valentina Shaggy, MD  levocetirizine (XYZAL) 5 MG tablet Take 1 tablet (5 mg total) by mouth every evening. 02/07/19   Valentina Shaggy, MD  losartan-hydrochlorothiazide (HYZAAR) 100-25 MG tablet Take 1 tablet by mouth daily.    [provider]  Multiple Vitamin (MULTIVITAMIN WITH MINERALS) TABS tablet Take 1 tablet by mouth daily. Centrum Multivitamin    [provider]  NALOCET 2.5-300 MG per tablet Take 1 tablet by mouth 4 (four) times daily as needed. 02/06/19   [provider]  oxyCODONE-acetaminophen (PERCOCET/ROXICET) 5-325 MG tablet Take 1-2 tablets by mouth every 6 (six) hours as needed for severe pain. Post op pain Patient not taking: Reported on 05/02/2019 02/14/19   [name], MD  pantoprazole (PROTONIX) 40 MG tablet Take 1 tablet (40 mg total) by mouth daily. 01/13/19   Valentina Shaggy, MD  predniSONE (DELTASONE) 20 MG tablet 2 tabs po daily x 4 days 05/16/19   Sherwood Gambler, MD  Revefenacin (YUPELRI) 175 MCG/3ML SOLN Inhale 3 mLs into the lungs daily. 04/27/19   Valentina Shaggy, MD    Family History Family History  Problem Relation Age of Onset  . Hypertension Mother   . Stroke Mother   . Cancer Father     Social History Social History   Tobacco Use  . Smoking status: Former Smoker    Types: Cigarettes    Quit date: 06/15/1998    Years since quitting: 20.9  . Smokeless tobacco: Never Used  Substance Use Topics  . Alcohol use: Never    Frequency: Never  . Drug use: Never     Allergies   Banana   Review of Systems Review of Systems  Constitutional: Negative for fever.  HENT: Negative for sore throat.   Respiratory: Positive for cough and shortness of breath.   Cardiovascular: Negative for chest pain and leg swelling.  Gastrointestinal: Negative for vomiting.  All other systems reviewed and are negative.    Physical Exam Updated Vital Signs BP (!) 157/92 (BP  Location: Right Arm)   Pulse 92   Temp 99.6 F (37.6 C) (Oral)   Resp 20   Ht 5' 10"  (1.778 m)   Wt 129.3 kg   SpO2 100%   BMI 40.89 kg/m   Physical Exam Vitals signs and nursing note reviewed.  Constitutional:      Appearance: She is well-developed. She is obese.  HENT:     Head: Normocephalic and atraumatic.     Right Ear: External ear normal.     Left Ear: External ear normal.     Nose: Nose normal.  Eyes:     General:        Right eye: No discharge.        Left eye: No discharge.  Cardiovascular:  Rate and Rhythm: Normal rate and regular rhythm.     Heart sounds: Normal heart sounds.  Pulmonary:     Effort: Pulmonary effort is normal. No tachypnea or accessory muscle usage.     Breath sounds: Wheezing (diffuse, expiratory) present.     Comments: No increased WOB. Able to speak in complete sentences Abdominal:     Palpations: Abdomen is soft.     Tenderness: There is no abdominal tenderness.  Musculoskeletal:     Right lower leg: She exhibits no tenderness. No edema.     Left lower leg: She exhibits no tenderness. No edema.  Skin:    General: Skin is warm and dry.  Neurological:     Mental Status: She is alert.  Psychiatric:        Mood and Affect: Mood is not anxious.      ED Treatments / Results  Labs (all labs ordered are listed, but only abnormal results are displayed) Labs Reviewed  NOVEL CORONAVIRUS, NAA (HOSP ORDER, SEND-OUT TO REF LAB; TAT 18-24 HRS)  POC SARS CORONAVIRUS 2 AG -  ED    EKG EKG Interpretation  Date/Time:  Monday May 15 2019 08:17:30 EST Ventricular Rate:  100 PR Interval:    QRS Duration: 88 QT Interval:  362 QTC Calculation: 467 R Axis:   81 Text Interpretation: Sinus tachycardia Probable left atrial enlargement Borderline right axis deviation Minimal ST depression, inferior leads Confirmed by Veryl Speak [phone]) on 05/15/2019 8:25:39 AM   Radiology Dg Chest 2 View  Result Date: 05/15/2019 CLINICAL DATA:   Worsening cough and shortness of breath over the past week. History of asthma. EXAM: CHEST - 2 VIEW COMPARISON:  12/19/2018 FINDINGS: The cardiac silhouette, mediastinal and hilar contours are within normal limits and stable. The lungs demonstrate chronic hyperinflation. No infiltrates or effusions. No worrisome pulmonary lesions. The bony thorax is intact. IMPRESSION: Stable hyperinflation but no acute pulmonary findings. Electronically Signed   By: Marijo Sanes M.D.   On: 05/15/2019 08:44    Procedures Procedures (including critical care time)  Medications Ordered in ED Medications  albuterol (VENTOLIN HFA) 108 (90 Base) MCG/ACT inhaler 8 puff (8 puffs Inhalation Given 05/15/19 0932)  predniSONE (DELTASONE) tablet 60 mg (60 mg Oral Given 05/15/19 0932)     Initial Impression / Assessment and Plan / ED Course  I have reviewed the triage vital signs and the nursing notes.  Pertinent labs & imaging results that were available during my care of the patient were reviewed by me and considered in my medical decision making (see chart for details).        Patient has both asthma and COPD listed in her chart.  This appears to be a COPD exacerbation.  Rapid coronavirus antigen testing is negative.  Will confirm with outpatient PCR.  Otherwise, no obvious pneumonia on chest x-ray.  Given change in sputum and dyspnea, will add antibiotics in addition to steroid burst and continued albuterol.  We discussed return precautions.  Glennys Schorsch was evaluated in Emergency Department on 05/15/2019 for the symptoms described in the history of present illness. She was evaluated in the context of the global COVID-19 pandemic, which necessitated consideration that the patient might be at risk for infection with the SARS-CoV-2 virus that causes COVID-19. Institutional protocols and algorithms that pertain to the evaluation of patients at risk for COVID-19 are in a state of rapid change based on information  released by regulatory bodies including the CDC and federal and state organizations.  These policies and algorithms were followed during the patient's care in the ED.   Final Clinical Impressions(s) / ED Diagnoses   Final diagnoses:  COPD exacerbation [facility])    ED Discharge Orders         Ordered    predniSONE (DELTASONE) 20 MG tablet     05/15/19 1002    azithromycin (ZITHROMAX) 250 MG tablet  Daily     05/15/19 1002           Sherwood Gambler, MD 05/15/19 1004

## 2019-05-16 LAB — NOVEL CORONAVIRUS, NAA (HOSP ORDER, SEND-OUT TO REF LAB; TAT 18-24 HRS): SARS-CoV-2, NAA: NOT DETECTED

## 2019-05-21 ENCOUNTER — Inpatient Hospital Stay (HOSPITAL_COMMUNITY)
Admission: EM | Admit: 2019-05-21 | Discharge: 2019-05-24 | DRG: 191 | Disposition: A | Discharge status: Home / Self Care | Payer: Medicare Other | Attending: Internal Medicine | Admitting: Internal Medicine

## 2019-05-21 ENCOUNTER — Encounter (HOSPITAL_COMMUNITY): Payer: Self-pay | Admitting: Emergency Medicine

## 2019-05-21 ENCOUNTER — Other Ambulatory Visit: Payer: Self-pay

## 2019-05-21 ENCOUNTER — Emergency Department (HOSPITAL_COMMUNITY): Payer: Medicare Other

## 2019-05-21 DIAGNOSIS — Z823 Family history of stroke: Secondary | ICD-10-CM

## 2019-05-21 DIAGNOSIS — Z9682 Presence of neurostimulator: Secondary | ICD-10-CM

## 2019-05-21 DIAGNOSIS — Z9884 Bariatric surgery status: Secondary | ICD-10-CM

## 2019-05-21 DIAGNOSIS — J4551 Severe persistent asthma with (acute) exacerbation: Secondary | ICD-10-CM | POA: Diagnosis not present

## 2019-05-21 DIAGNOSIS — G4733 Obstructive sleep apnea (adult) (pediatric): Secondary | ICD-10-CM | POA: Diagnosis present

## 2019-05-21 DIAGNOSIS — I10 Essential (primary) hypertension: Secondary | ICD-10-CM | POA: Diagnosis present

## 2019-05-21 DIAGNOSIS — Z87891 Personal history of nicotine dependence: Secondary | ICD-10-CM

## 2019-05-21 DIAGNOSIS — I4581 Long QT syndrome: Secondary | ICD-10-CM | POA: Diagnosis present

## 2019-05-21 DIAGNOSIS — Z79891 Long term (current) use of opiate analgesic: Secondary | ICD-10-CM

## 2019-05-21 DIAGNOSIS — R7303 Prediabetes: Secondary | ICD-10-CM | POA: Diagnosis present

## 2019-05-21 DIAGNOSIS — J441 Chronic obstructive pulmonary disease with (acute) exacerbation: Secondary | ICD-10-CM | POA: Diagnosis not present

## 2019-05-21 DIAGNOSIS — Z7951 Long term (current) use of inhaled steroids: Secondary | ICD-10-CM

## 2019-05-21 DIAGNOSIS — J45901 Unspecified asthma with (acute) exacerbation: Secondary | ICD-10-CM | POA: Diagnosis present

## 2019-05-21 DIAGNOSIS — Z96622 Presence of left artificial elbow joint: Secondary | ICD-10-CM | POA: Diagnosis present

## 2019-05-21 DIAGNOSIS — T380X5A Adverse effect of glucocorticoids and synthetic analogues, initial encounter: Secondary | ICD-10-CM | POA: Diagnosis not present

## 2019-05-21 DIAGNOSIS — E669 Obesity, unspecified: Secondary | ICD-10-CM | POA: Diagnosis present

## 2019-05-21 DIAGNOSIS — Z79899 Other long term (current) drug therapy: Secondary | ICD-10-CM

## 2019-05-21 DIAGNOSIS — R739 Hyperglycemia, unspecified: Secondary | ICD-10-CM | POA: Diagnosis not present

## 2019-05-21 DIAGNOSIS — Z6841 Body Mass Index (BMI) 40.0 and over, adult: Secondary | ICD-10-CM

## 2019-05-21 DIAGNOSIS — R0602 Shortness of breath: Secondary | ICD-10-CM | POA: Diagnosis not present

## 2019-05-21 DIAGNOSIS — Z8249 Family history of ischemic heart disease and other diseases of the circulatory system: Secondary | ICD-10-CM

## 2019-05-21 DIAGNOSIS — Z9851 Tubal ligation status: Secondary | ICD-10-CM

## 2019-05-21 DIAGNOSIS — Y92230 Patient room in hospital as the place of occurrence of the external cause: Secondary | ICD-10-CM | POA: Diagnosis not present

## 2019-05-21 DIAGNOSIS — Z20828 Contact with and (suspected) exposure to other viral communicable diseases: Secondary | ICD-10-CM | POA: Diagnosis present

## 2019-05-21 LAB — SARS CORONAVIRUS 2 (TAT 6-24 HRS): SARS Coronavirus 2: NEGATIVE

## 2019-05-21 LAB — CBC WITH DIFFERENTIAL/PLATELET
Abs Immature Granulocytes: 0.09 10*3/uL — ABNORMAL HIGH (ref 0.00–0.07)
Basophils Absolute: 0 10*3/uL (ref 0.0–0.1)
Basophils Relative: 0 %
Eosinophils Absolute: 0 10*3/uL (ref 0.0–0.5)
Eosinophils Relative: 0 %
HCT: 37.7 % (ref 36.0–46.0)
Hemoglobin: 11.6 g/dL — ABNORMAL LOW (ref 12.0–15.0)
Immature Granulocytes: 1 %
Lymphocytes Relative: 14 %
Lymphs Abs: 2.5 10*3/uL (ref 0.7–4.0)
MCH: 26 pg (ref 26.0–34.0)
MCHC: 30.8 g/dL (ref 30.0–36.0)
MCV: 84.5 fL (ref 80.0–100.0)
Monocytes Absolute: 0.8 10*3/uL (ref 0.1–1.0)
Monocytes Relative: 5 %
Neutro Abs: 14.6 10*3/uL — ABNORMAL HIGH (ref 1.7–7.7)
Neutrophils Relative %: 80 %
Platelets: 359 10*3/uL (ref 150–400)
RBC: 4.46 MIL/uL (ref 3.87–5.11)
RDW: 16.6 % — ABNORMAL HIGH (ref 11.5–15.5)
WBC: 18 10*3/uL — ABNORMAL HIGH (ref 4.0–10.5)
nRBC: 0 % (ref 0.0–0.2)

## 2019-05-21 LAB — BASIC METABOLIC PANEL
Anion gap: 13 (ref 5–15)
BUN: 17 mg/dL (ref 8–23)
CO2: 26 mmol/L (ref 22–32)
Calcium: 9.4 mg/dL (ref 8.9–10.3)
Chloride: 100 mmol/L (ref 98–111)
Creatinine, Ser: 0.72 mg/dL (ref 0.44–1.00)
GFR calc Af Amer: >60 mL/min (ref >60)
GFR calc non Af Amer: >60 mL/min (ref >60)
Glucose, Bld: 98 mg/dL (ref 70–99)
Potassium: 4.5 mmol/L (ref 3.5–5.1)
Sodium: 139 mmol/L (ref 135–145)

## 2019-05-21 LAB — POC SARS CORONAVIRUS 2 AG -  ED: SARS Coronavirus 2 Ag: NEGATIVE

## 2019-05-21 LAB — HIV ANTIBODY (ROUTINE TESTING W REFLEX): HIV Screen 4th Generation wRfx: NONREACTIVE

## 2019-05-21 LAB — LACTIC ACID, PLASMA
Lactic Acid, Venous: 1.1 mmol/L (ref 0.5–1.9)
Lactic Acid, Venous: 1.9 mmol/L (ref 0.5–1.9)

## 2019-05-21 MED ORDER — ONDANSETRON HCL 4 MG/2ML IJ SOLN: 4.0000 mg | Freq: Once | INTRAMUSCULAR | Status: DC | PRN

## 2019-05-21 MED ORDER — METHYLPREDNISOLONE SODIUM SUCC 125 MG IJ SOLR
125.0000 mg | Freq: Once | INTRAMUSCULAR | Status: AC
  Administered 2019-05-21: 125 mg via INTRAVENOUS
  Filled 2019-05-21: qty 2

## 2019-05-21 MED ORDER — METHYLPREDNISOLONE SODIUM SUCC 125 MG IJ SOLR
60.0000 mg | Freq: Two times a day (BID) | INTRAMUSCULAR | Status: DC
  Administered 2019-05-21 – 2019-05-23 (×5): 60 mg via INTRAVENOUS
  Filled 2019-05-21 (×5): qty 2

## 2019-05-21 MED ORDER — HYDROCHLOROTHIAZIDE 25 MG PO TABS
25.0000 mg | ORAL_TABLET | Freq: Every day | ORAL | Status: DC
  Administered 2019-05-22 – 2019-05-24 (×3): 25 mg via ORAL
  Filled 2019-05-21 (×3): qty 1

## 2019-05-21 MED ORDER — ZOLPIDEM TARTRATE 5 MG PO TABS
5.0000 mg | ORAL_TABLET | Freq: Once | ORAL | Status: AC
  Administered 2019-05-21: 5 mg via ORAL
  Filled 2019-05-21: qty 1

## 2019-05-21 MED ORDER — ALBUTEROL SULFATE (2.5 MG/3ML) 0.083% IN NEBU: 2.5000 mg | INHALATION_SOLUTION | Freq: Four times a day (QID) | RESPIRATORY_TRACT | Status: DC | PRN

## 2019-05-21 MED ORDER — REVEFENACIN 175 MCG/3ML IN SOLN: 3.0000 mL | Freq: Every day | RESPIRATORY_TRACT | Status: DC

## 2019-05-21 MED ORDER — AEROCHAMBER PLUS FLO-VU MEDIUM MISC
1.0000 | Freq: Once | Status: AC
  Administered 2019-05-21: 1
  Filled 2019-05-21: qty 1

## 2019-05-21 MED ORDER — ALBUTEROL SULFATE HFA 108 (90 BASE) MCG/ACT IN AERS
8.0000 | INHALATION_SPRAY | Freq: Once | RESPIRATORY_TRACT | Status: AC
  Administered 2019-05-21: 8 via RESPIRATORY_TRACT
  Filled 2019-05-21: qty 6.7

## 2019-05-21 MED ORDER — OXYCODONE-ACETAMINOPHEN 5-325 MG PO TABS: 1.0000 | ORAL_TABLET | Freq: Four times a day (QID) | ORAL | Status: DC | PRN

## 2019-05-21 MED ORDER — LORATADINE 10 MG PO TABS
10.0000 mg | ORAL_TABLET | Freq: Every evening | ORAL | Status: DC
  Administered 2019-05-21 – 2019-05-23 (×3): 10 mg via ORAL
  Filled 2019-05-21 (×3): qty 1

## 2019-05-21 MED ORDER — IPRATROPIUM-ALBUTEROL 20-100 MCG/ACT IN AERS
1.0000 | INHALATION_SPRAY | Freq: Four times a day (QID) | RESPIRATORY_TRACT | Status: DC
  Administered 2019-05-21 – 2019-05-22 (×3): 1 via RESPIRATORY_TRACT
  Filled 2019-05-21: qty 4

## 2019-05-21 MED ORDER — ENOXAPARIN SODIUM 60 MG/0.6ML ~~LOC~~ SOLN
60.0000 mg | SUBCUTANEOUS | Status: DC
  Administered 2019-05-21 – 2019-05-23 (×3): 60 mg via SUBCUTANEOUS
  Filled 2019-05-21 (×3): qty 0.6

## 2019-05-21 MED ORDER — ACETAMINOPHEN 650 MG RE SUPP: 650.0000 mg | Freq: Four times a day (QID) | RECTAL | Status: DC | PRN

## 2019-05-21 MED ORDER — MAGNESIUM SULFATE 2 GM/50ML IV SOLN
2.0000 g | Freq: Once | INTRAVENOUS | Status: AC
  Administered 2019-05-21: 2 g via INTRAVENOUS
  Filled 2019-05-21: qty 50

## 2019-05-21 MED ORDER — UMECLIDINIUM BROMIDE 62.5 MCG/INH IN AEPB
1.0000 | INHALATION_SPRAY | Freq: Every day | RESPIRATORY_TRACT | Status: DC
  Administered 2019-05-21 – 2019-05-22 (×2): 1 via RESPIRATORY_TRACT
  Filled 2019-05-21: qty 7

## 2019-05-21 MED ORDER — IPRATROPIUM BROMIDE 0.02 % IN SOLN
0.5000 mg | Freq: Once | RESPIRATORY_TRACT | Status: AC
  Administered 2019-05-21: 0.5 mg via RESPIRATORY_TRACT
  Filled 2019-05-21 (×2): qty 2.5

## 2019-05-21 MED ORDER — ALBUTEROL SULFATE HFA 108 (90 BASE) MCG/ACT IN AERS
1.0000 | INHALATION_SPRAY | Freq: Four times a day (QID) | RESPIRATORY_TRACT | Status: DC | PRN
  Filled 2019-05-21: qty 6.7

## 2019-05-21 MED ORDER — ACETAMINOPHEN 325 MG PO TABS: 650.0000 mg | ORAL_TABLET | Freq: Four times a day (QID) | ORAL | Status: DC | PRN

## 2019-05-21 MED ORDER — LOSARTAN POTASSIUM-HCTZ 100-25 MG PO TABS: 1.0000 | ORAL_TABLET | Freq: Every day | ORAL | Status: DC

## 2019-05-21 MED ORDER — PANTOPRAZOLE SODIUM 40 MG PO TBEC
40.0000 mg | DELAYED_RELEASE_TABLET | Freq: Every day | ORAL | Status: DC
  Administered 2019-05-21 – 2019-05-24 (×4): 40 mg via ORAL
  Filled 2019-05-21 (×4): qty 1

## 2019-05-21 MED ORDER — ALBUTEROL (5 MG/ML) CONTINUOUS INHALATION SOLN
10.0000 mg/h | INHALATION_SOLUTION | Freq: Once | RESPIRATORY_TRACT | Status: AC
  Administered 2019-05-21: 10 mg/h via RESPIRATORY_TRACT
  Filled 2019-05-21 (×2): qty 20

## 2019-05-21 MED ORDER — LOSARTAN POTASSIUM 50 MG PO TABS
100.0000 mg | ORAL_TABLET | Freq: Every day | ORAL | Status: DC
  Administered 2019-05-22 – 2019-05-24 (×3): 100 mg via ORAL
  Filled 2019-05-21 (×3): qty 2

## 2019-05-21 MED ORDER — LEVOFLOXACIN 750 MG PO TABS
750.0000 mg | ORAL_TABLET | Freq: Every day | ORAL | Status: DC
  Administered 2019-05-21 – 2019-05-22 (×2): 750 mg via ORAL
  Filled 2019-05-21 (×2): qty 1

## 2019-05-21 NOTE — H&P (Signed)
History and Physical    Patty Howard:[id number] DOB: [birth year] DOA: 05/21/2019  I have briefly reviewed the patient's prior medical records in [facility]  PCP: Sandi Mariscal, MD  Patient coming from: home  Chief Complaint: shortness of breath   HPI: Patty Howard is a 63 y.o. female with medical history significant of asthma, COPD, hypertension, OSA not wearing CPAP, obesity, who presents to the hospital with chief complaint of shortness of breath.  Patient has been feeling short of breath with minimal activities for the past couple of weeks, has been in the emergency room a week ago and she was tested negative for Covid, given prednisone as well as a antibiotic course however did not feel any improvement and decided to come back to the emergency room.  She states that she has been having a productive cough with thick greenish-yellow sputum that gets stuck in her throat and makes her quite short of breath.  She denies any chest pain, denies any palpitations.  She denies any abdominal pain, nausea or vomiting.  She denies any sick contacts or coming in contact with anybody with Covid  ED Course: In the emergency room she has a temperature of 100.2, tachypneic and tachycardic.  Her blood pressure is stable, she is satting 95 200% on room air.  Two-view chest x-ray shows changes consistent with COPD without acute findings or infiltrates.  Blood work pertinent for a white count of 18,000, BMP is unremarkable.  Lactic acid is 1.1.  Point-of-care SARS-CoV-2 antigen was negative.  She was given nebulizer treatment, however due to persistent shortness of breath and wheezing on exam we are asked to admit  Review of Systems: All systems reviewed, and apart from HPI, all negative  Past Medical History:  Diagnosis Date  . Anxiety   . Arthritis   . Asthma   . Asthma   . COPD (chronic obstructive pulmonary disease) ([place])   . Fracture    closed displaced left radial head  .  Hypertension   . Sleep apnea    does not wear CPAP    Past Surgical History:  Procedure Laterality Date  . BACK SURGERY     spinal stimulator  . COLONOSCOPY W/ BIOPSIES AND POLYPECTOMY    . DILATION AND CURETTAGE OF UTERUS    . LAPAROSCOPIC ROUX-EN-Y GASTRIC BYPASS WITH UPPER ENDOSCOPY AND REMOVAL OF LAP BAND    . RADIAL HEAD ARTHROPLASTY Left 02/03/2019   Procedure: LEFT RADIAL HEAD ARTHROPLASTY;  Surgeon: [name], MD;  Location: [place];  Service: Orthopedics;  Laterality: Left;  AXILLARY BLOCK VS BIER BLOCK  . TUBAL LIGATION       reports that she quit smoking about 20 years ago. Her smoking use included cigarettes. She has never used smokeless tobacco. She reports that she does not drink alcohol or use drugs.  Allergies  Allergen Reactions  . Banana Hives and Itching    Family History  Problem Relation Age of Onset  . Hypertension Mother   . Stroke Mother   . Cancer Father     Prior to Admission medications   Medication Sig Start Date End Date Taking? Authorizing Provider  albuterol (VENTOLIN HFA) 108 (90 Base) MCG/ACT inhaler Inhale 1-2 puffs into the lungs every 6 (six) hours as needed for wheezing or shortness of breath. 04/27/19  Yes Valentina Shaggy, MD  Benralizumab [facility]) Inject 1 Dose into the skin every 8 (eight) weeks.    Yes [provider]  budesonide (PULMICORT)  0.5 MG/2ML nebulizer solution Take 2 mLs (0.5 mg total) by nebulization 2 (two) times daily. 04/27/19  Yes Valentina Shaggy, MD  Cholecalciferol (VITAMIN D3 PO) Take 1 tablet by mouth 5 days.   Yes [provider]  fluticasone (FLONASE) 50 MCG/ACT nasal spray Place 2 sprays into both nostrils daily as needed for allergies. 04/27/19  Yes Valentina Shaggy, MD  formoterol (PERFOROMIST) 20 MCG/2ML nebulizer solution Take 2 mLs (20 mcg total) by nebulization 2 (two) times daily. 04/27/19  Yes Valentina Shaggy, MD  ipratropium (ATROVENT) 0.02 % nebulizer solution  Take 1.25 mLs (0.25 mg total) by nebulization every 4 (four) hours as needed. Patient taking differently: Take 0.25 mg by nebulization every 4 (four) hours as needed for wheezing or shortness of breath.  12/20/18  Yes Valentina Shaggy, MD  levocetirizine (XYZAL) 5 MG tablet Take 1 tablet (5 mg total) by mouth every evening. 02/07/19  Yes Valentina Shaggy, MD  losartan-hydrochlorothiazide (HYZAAR) 100-25 MG tablet Take 1 tablet by mouth daily.   Yes [provider]  Multiple Vitamin (MULTIVITAMIN WITH MINERALS) TABS tablet Take 1 tablet by mouth daily. Centrum Multivitamin   Yes [provider]  oxyCODONE-acetaminophen (PERCOCET/ROXICET) 5-325 MG tablet Take 1-2 tablets by mouth every 6 (six) hours as needed for severe pain. Post op pain 02/14/19  Yes [name], MD  Revefenacin (YUPELRI) 175 MCG/3ML SOLN Inhale 3 mLs into the lungs daily. 04/27/19  Yes Valentina Shaggy, MD  ipratropium-albuterol (DUONEB) 0.5-2.5 (3) MG/3ML SOLN INHALE 1 VIAL BY NEBULIZER EVERY 6 HOURS AS NEEDED FOR SHORTNESS OF BREATH AND WHEEZING Patient not taking: Reported on 05/21/2019 04/24/19   Valentina Shaggy, MD  pantoprazole (PROTONIX) 40 MG tablet Take 1 tablet (40 mg total) by mouth daily. Patient not taking: Reported on 05/21/2019 01/13/19   Valentina Shaggy, MD    Physical Exam: Vitals:   05/21/19 0945 05/21/19 1100 05/21/19 1130 05/21/19 1200  BP: (!) 171/124 (!) 166/104  107/89  Pulse: (!) 116 (!) 107 (!) 110   Resp: (!) 21 (!) 21 18   Temp:      TempSrc:      SpO2: 95% 100% 99%   Weight:      Height:       Constitutional: Appears comfortable but had two coughing spells when I was in the room and became quite short of breath and tachycardic Eyes: PERRL, lids and conjunctivae normal ENMT: Mucous membranes are moist.  Neck: normal, supple Respiratory: Diffuse bilateral end expiratory wheezing, moves air well overall.  Tachypneic. Cardiovascular: Regular rate and rhythm,  no murmurs / rubs / gallops. No extremity edema. 2+ pedal pulses.  Abdomen: no tenderness, no masses palpated. Bowel sounds positive.  Musculoskeletal: no clubbing / cyanosis. Normal muscle tone.  Skin: no rashes Neurologic: CN 2-12 grossly intact. Strength 5/5 in all 4.  Psychiatric: Normal judgment and insight. Alert and oriented x 3. Normal mood.   Labs on Admission: I have personally reviewed following labs and imaging studies  CBC: Recent Labs  Lab 05/21/19 0711  WBC 18.0*  NEUTROABS 14.6*  HGB 11.6*  HCT 37.7  MCV 84.5  PLT 211   Basic Metabolic Panel: Recent Labs  Lab 05/21/19 0711  NA 139  K 4.5  CL 100  CO2 26  GLUCOSE 98  BUN 17  CREATININE 0.72  CALCIUM 9.4   Liver Function Tests: No results for input(s): AST, ALT, ALKPHOS, BILITOT, PROT, ALBUMIN in the last 168 hours.  Coagulation Profile: No results for input(s): INR, PROTIME in the last 168 hours. BNP (last 3 results) No results for input(s): PROBNP in the last 8760 hours. CBG: No results for input(s): GLUCAP in the last 168 hours. Thyroid Function Tests: No results for input(s): TSH, T4TOTAL, FREET4, T3FREE, THYROIDAB in the last 72 hours. Urine analysis:    Component Value Date/Time   COLORURINE YELLOW 10/15/2018 1948   APPEARANCEUR HAZY (A) 10/15/2018 1948   LABSPEC 1.028 10/15/2018 1948   PHURINE 5.0 10/15/2018 1948   GLUCOSEU NEGATIVE 10/15/2018 1948   HGBUR NEGATIVE 10/15/2018 [place] NEGATIVE 10/15/2018 1948   KETONESUR 5 (A) 10/15/2018 1948   PROTEINUR NEGATIVE 10/15/2018 1948   NITRITE NEGATIVE 10/15/2018 1948   LEUKOCYTESUR MODERATE (A) 10/15/2018 1948     Radiological Exams on Admission: Dg Chest 2 View  Result Date: 05/21/2019 CLINICAL DATA:  Shortness of breath.  Asthma.  COPD. EXAM: CHEST - 2 VIEW COMPARISON:  05/15/2019 FINDINGS: The heart size and mediastinal contours are within normal limits. Aortic atherosclerosis. Pulmonary hyperinflation is again seen,  consistent with COPD. Both lungs are clear. The visualized skeletal structures are unremarkable. IMPRESSION: COPD.  No active cardiopulmonary disease. Electronically Signed   By: Marlaine Hind M.D.   On: 05/21/2019 07:15    EKG: Independently reviewed.  Sinus tachycardia  Assessment/Plan  Principal Problem COPD /asthma exacerbation -Admit patient to the hospital for COPD exacerbation, she has effusion wheezing on exam.  Her O2 sats are fairly good and does not require supplemental oxygen -Given purulent sputum production placed on Levaquin, also placed on IV steroids -Incentive spirometry, flutter valve, inhalers -COVID-19 PCR is pending, if negative change to nebulizers  Active Problems Hypertension -Continue home medications  Leukocytosis -Likely due to outpatient steroids  Obesity -She would benefit from weight loss    DVT prophylaxis: Lovenox Code Status: Full code Family Communication: Discussed with patient Disposition Plan: Home when ready Bed Type: MedSurg Consults called: None Obs/Inp: Observation  Marzetta Board, MD, PhD [facility]  Contact via [email or web address]  05/21/2019, 1:31 PM

## 2019-05-21 NOTE — ED Notes (Signed)
Phlebotomy called at this time.

## 2019-05-21 NOTE — ED Triage Notes (Signed)
Patient here from home with complaints of SOB. Reports that she was seen on 11/30 finished medications, but "never got better". Hx of asthma, COPD.

## 2019-05-21 NOTE — ED Notes (Signed)
While ambulating patient's SpO2 dropped from 98% resting to 92%. Patient's heart rate also increased from 110s resting to 140s.

## 2019-05-21 NOTE — ED Notes (Signed)
Respiratory called at this time for neb treatment.

## 2019-05-21 NOTE — Progress Notes (Signed)
Peak flow 300; best of 3. Patient had good effort.

## 2019-05-21 NOTE — ED Provider Notes (Signed)
[facility] DEPT Provider Note   CSN: [id number] Arrival date & time: 05/21/19  3818     History   Chief Complaint Chief Complaint  Patient presents with  . Shortness of Breath    HPI Patty Howard is a 63 y.o. female h/o asthma, COPD, obesity, sleep apnea not on CPAP presents to the ER for evaluation of exertional shortness of breath.  Reports feeling breathless with walking across her house having to sit down to catch her breath.  Onset approximately 1 week ago, worsening.  Came to the ER on 11/30 for productive cough.  She tested negative for COVID-19 and was discharged with prednisone and azithromycin which she finished but states these did not help.  She has worsening low-grade fever 100.2 here, productive cough of thick white mucus that makes her feel like she is choking and makes her short of breath.  Has been taking Mucinex and all her COPD inhalers including Pulmicort, Perforomist.  Has been doing nebulizing treatments every 4-6 hours but states these have stopped working.  She has been quarantining at home for the last week and has not been exposed to anybody else after being discharged from the ER.  Denies exposure to COVID-19.  Denies associated new congestion, sore throat, chest pain, vomiting, diarrhea, abdominal pain, changes in smell or taste.  She stopped smoking cigarettes 20 years ago.  Does not use oxygen at home.  Has required admission in the past for asthma/COPD exacerbation.  Has never required intubation or ICU stay.  No associated chest pain, lower extremity edema.  Marland Kitchen   HPI  Past Medical History:  Diagnosis Date  . Anxiety   . Arthritis   . Asthma   . Asthma   . COPD (chronic obstructive pulmonary disease) ([place])   . Fracture    closed displaced left radial head  . Hypertension   . Sleep apnea    does not wear CPAP    Patient Active Problem List   Diagnosis Date Noted  . Left radial head fracture 02/03/2019  . Closed  fracture of radial head 01/29/2019  . Prediabetes 10/19/2018  . Hypertension, essential, benign 10/19/2018  . 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) ([place]) 10/19/2018  . Insomnia 10/19/2018  . Elbow pain, chronic, left 10/19/2018  . Asthma 05/29/2018  . COPD exacerbation ([place]) 04/05/2018  . H/O Spinal surgery 01/18/2015  . HTN (hypertension) 01/18/2015  . Obesity 01/18/2015    Past Surgical History:  Procedure Laterality Date  . BACK SURGERY     spinal stimulator  . COLONOSCOPY W/ BIOPSIES AND POLYPECTOMY    . DILATION AND CURETTAGE OF UTERUS    . LAPAROSCOPIC ROUX-EN-Y GASTRIC BYPASS WITH UPPER ENDOSCOPY AND REMOVAL OF LAP BAND    . RADIAL HEAD ARTHROPLASTY Left 02/03/2019   Procedure: LEFT RADIAL HEAD ARTHROPLASTY;  Surgeon: [name], MD;  Location: [facility];  Service: Orthopedics;  Laterality: Left;  AXILLARY BLOCK VS BIER BLOCK  . TUBAL LIGATION       OB History   No obstetric history on file.      Home Medications    Prior to Admission medications   Medication Sig Start Date End Date Taking? Authorizing Provider  albuterol (VENTOLIN HFA) 108 (90 Base) MCG/ACT inhaler Inhale 1-2 puffs into the lungs every 6 (six) hours as needed for wheezing or shortness of breath. 04/27/19  Yes Valentina Shaggy, MD  Benralizumab [facility]) Inject 1 Dose into the skin every 8 (eight) weeks.  Yes [provider]  budesonide (PULMICORT) 0.5 MG/2ML nebulizer solution Take 2 mLs (0.5 mg total) by nebulization 2 (two) times daily. 04/27/19  Yes Valentina Shaggy, MD  Cholecalciferol (VITAMIN D3 PO) Take 1 tablet by mouth 5 days.   Yes [provider]  fluticasone (FLONASE) 50 MCG/ACT nasal spray Place 2 sprays into both nostrils daily as needed for allergies. 04/27/19  Yes Valentina Shaggy, MD  formoterol (PERFOROMIST) 20 MCG/2ML nebulizer solution Take 2 mLs (20 mcg total) by nebulization 2 (two) times daily. 04/27/19  Yes Valentina Shaggy, MD  ipratropium (ATROVENT) 0.02 % nebulizer solution Take 1.25 mLs (0.25 mg total) by nebulization every 4 (four) hours as needed. Patient taking differently: Take 0.25 mg by nebulization every 4 (four) hours as needed for wheezing or shortness of breath.  12/20/18  Yes Valentina Shaggy, MD  levocetirizine (XYZAL) 5 MG tablet Take 1 tablet (5 mg total) by mouth every evening. 02/07/19  Yes Valentina Shaggy, MD  losartan-hydrochlorothiazide (HYZAAR) 100-25 MG tablet Take 1 tablet by mouth daily.   Yes [provider]  Multiple Vitamin (MULTIVITAMIN WITH MINERALS) TABS tablet Take 1 tablet by mouth daily. Centrum Multivitamin   Yes [provider]  oxyCODONE-acetaminophen (PERCOCET/ROXICET) 5-325 MG tablet Take 1-2 tablets by mouth every 6 (six) hours as needed for severe pain. Post op pain 02/14/19  Yes [name], MD  Revefenacin (YUPELRI) 175 MCG/3ML SOLN Inhale 3 mLs into the lungs daily. 04/27/19  Yes Valentina Shaggy, MD  ipratropium-albuterol (DUONEB) 0.5-2.5 (3) MG/3ML SOLN INHALE 1 VIAL BY NEBULIZER EVERY 6 HOURS AS NEEDED FOR SHORTNESS OF BREATH AND WHEEZING Patient not taking: Reported on 05/21/2019 04/24/19   Valentina Shaggy, MD  pantoprazole (PROTONIX) 40 MG tablet Take 1 tablet (40 mg total) by mouth daily. Patient not taking: Reported on 05/21/2019 01/13/19   Valentina Shaggy, MD    Family History Family History  Problem Relation Age of Onset  . Hypertension Mother   . Stroke Mother   . Cancer Father     Social History Social History   Tobacco Use  . Smoking status: Former Smoker    Types: Cigarettes    Quit date: 06/15/1998    Years since quitting: 20.9  . Smokeless tobacco: Never Used  Substance Use Topics  . Alcohol use: Never    Frequency: Never  . Drug use: Never     Allergies   Banana   Review of Systems Review of Systems  Respiratory: Positive for cough, chest tightness, shortness of breath and wheezing.   All  other systems reviewed and are negative.    Physical Exam Updated Vital Signs BP 107/89   Pulse (!) 110   Temp 100.2 F (37.9 C) (Oral)   Resp 18   Ht 5' 10"  (1.778 m)   Wt 129.3 kg   SpO2 99%   BMI 40.89 kg/m   Physical Exam Vitals signs and nursing note reviewed.  Constitutional:      General: She is not in acute distress.    Appearance: She is well-developed.     Comments: NAD.  HENT:     Head: Normocephalic and atraumatic.     Right Ear: External ear normal.     Left Ear: External ear normal.     Nose: Nose normal.  Eyes:     General: No scleral icterus.    Conjunctiva/sclera: Conjunctivae normal.  Neck:     Musculoskeletal: Normal range of motion and  neck supple.  Cardiovascular:     Rate and Rhythm: Regular rhythm. Tachycardia present.     Heart sounds: Normal heart sounds. No murmur.     Comments: 1+ radial and DP pulses bilaterally.  No lower extremity edema.  No calf tenderness. Pulmonary:     Effort: Pulmonary effort is normal. Tachypnea present.     Breath sounds: Wheezing present.     Comments: Speaking in full sentences, no obvious increased work of breathing.  Mild tachypnea during conversation.  SPO2 greater than 95% during encounter.  Very faint end expiratory wheezing in middle and lower lobes posteriorly, exam is limited due to body habitus.  No crackles. Musculoskeletal: Normal range of motion.        General: No deformity.  Skin:    General: Skin is warm and dry.     Capillary Refill: Capillary refill takes less than 2 seconds.  Neurological:     Mental Status: She is alert and oriented to person, place, and time.  Psychiatric:        Behavior: Behavior normal.        Thought Content: Thought content normal.        Judgment: Judgment normal.      ED Treatments / Results  Labs (all labs ordered are listed, but only abnormal results are displayed) Labs Reviewed  CBC WITH DIFFERENTIAL/PLATELET - Abnormal; Notable for the following  components:      Result Value   WBC 18.0 (*)    Hemoglobin 11.6 (*)    RDW 16.6 (*)    Neutro Abs 14.6 (*)    Abs Immature Granulocytes 0.09 (*)    All other components within normal limits  CULTURE, BLOOD (ROUTINE X 2)  CULTURE, BLOOD (ROUTINE X 2)  SARS CORONAVIRUS 2 (TAT 6-24 HRS)  BASIC METABOLIC PANEL  LACTIC ACID, PLASMA  LACTIC ACID, PLASMA  POC SARS CORONAVIRUS 2 AG -  ED    EKG EKG Interpretation  Date/Time:  Sunday May 21 2019 08:45:39 EST Ventricular Rate:  111 PR Interval:    QRS Duration: 89 QT Interval:  359 QTC Calculation: 488 R Axis:   73 Text Interpretation: Sinus tachycardia Probable left atrial enlargement Minimal ST depression, diffuse leads Borderline prolonged QT interval similar to prior 11/20 Confirmed by Aletta Edouard [phone]) on 05/21/2019 9:04:17 AM   Radiology Dg Chest 2 View  Result Date: 05/21/2019 CLINICAL DATA:  Shortness of breath.  Asthma.  COPD. EXAM: CHEST - 2 VIEW COMPARISON:  05/15/2019 FINDINGS: The heart size and mediastinal contours are within normal limits. Aortic atherosclerosis. Pulmonary hyperinflation is again seen, consistent with COPD. Both lungs are clear. The visualized skeletal structures are unremarkable. IMPRESSION: COPD.  No active cardiopulmonary disease. Electronically Signed   By: Marlaine Hind M.D.   On: 05/21/2019 07:15    Procedures .Critical Care Performed by: Kinnie Feil, PA-C Authorized by: Kinnie Feil, PA-C   Critical care provider statement:    Critical care time (minutes):  45   Critical care was necessary to treat or prevent imminent or life-threatening deterioration of the following conditions: severe asthma exacerbation, requiring repeat breathing tx, admission.   Critical care was time spent personally by me on the following activities:  Discussions with consultants, evaluation of patient's response to treatment, examination of patient, ordering and performing treatments and  interventions, ordering and review of laboratory studies, ordering and review of radiographic studies, pulse oximetry, re-evaluation of patient's condition, obtaining history from patient or surrogate, review of old  charts and development of treatment plan with patient or surrogate   I assumed direction of critical care for this patient from another provider in my specialty: no     (including critical care time)  Medications Ordered in ED Medications  ondansetron (ZOFRAN) injection 4 mg (has no administration in time range)  magnesium sulfate IVPB 2 g 50 mL (0 g Intravenous Stopped 05/21/19 0903)  methylPREDNISolone sodium succinate (SOLU-MEDROL) 125 mg/2 mL injection 125 mg (125 mg Intravenous Given 05/21/19 0800)  albuterol (VENTOLIN HFA) 108 (90 Base) MCG/ACT inhaler 8 puff (8 puffs Inhalation Given 05/21/19 0758)  AeroChamber Plus Flo-Vu Medium MISC 1 each (1 each Other Given 05/21/19 0758)  albuterol (PROVENTIL,VENTOLIN) solution continuous neb (10 mg/hr Nebulization Given 05/21/19 1035)  ipratropium (ATROVENT) nebulizer solution 0.5 mg (0.5 mg Nebulization Given 05/21/19 1035)     Initial Impression / Assessment and Plan / ED Course  I have reviewed the triage vital signs and the nursing notes.  Pertinent labs & imaging results that were available during my care of the patient were reviewed by me and considered in my medical decision making (see chart for details).  Clinical Course as of May 20 1318  Sun May 21, 2019  0820 Temp: 100.2 F (37.9 C) [CG]  0820 Pulse Rate(!): 122 [CG]  0820 Resp(!): 22 [CG]  0820 Recent prednisone prescription   WBC(!): 18.0 [CG]  0820 NEUT#(!): 14.6 [CG]  0945 Patient ambulated for 2 min prior to duoneb, per RN "While ambulating patient's SpO2 dropped from 98% resting to 92%. Patient's heart rate also increased from 110s resting to 140s."   [CG]  1305 Re-evaluated pt. Ambulated her personally for 2 min, stopped at 1.5 min due to feeling light headed and  SOB.  Diaphoretic.  HR 120, SPO2 remained >94%   [CG]  6858 63 year old female history of asthma here with shortness of breath, going on for a few weeks.  She said she is been through 3 antibiotic courses and is on steroids with continued shortness of breath.  She is tachycardic here but speaking in full sentences and not hypoxic.  Labs and chest do not show any significant findings other than elevated white count which is likely related to her prednisone.  Possible admission   [MB]    Clinical Course User Index [CG] Kinnie Feil, PA-C [MB] Hayden Rasmussen, MD   EMR reviewed to assist with MDM.  Ddx includes COPD/asthma exacerbation refractory to outpatient treatmentvs community-acquired pneumonia given low-grade fever, tachycardia and worsening symptoms.  She is obese and deconditioned and likely contributing.  She has no associated chest pain and low suspicion for ACS, PE, pulmonary edema/CHF.  ER work-up personally reviewed shows mild leukocytosis with elevated neutrophils.  In setting of recent prednisone.  This raises concern for SIRS criteria given tachycardia and leukocytosis.  Will add lactic acid and blood cultures.   1320: ER work up personally reviewed. Chest x-ray is nonacute without infiltrates, edema.  EKG is nonischemic. POC covid negative. Pending lactic acid, blood cultures, confirmatory COVID teset.   Patient given Solu-Medrol, magnesium, several breathing treatments here.  She ambulated 40 minutes twice pre and post breathing treatments/IV medicines and continues to report exertional shortness of breath, lightheadedness.  I personally ambulated patient a second time and she had to stop walking due to the shortness of breath and lightheadedness.  She became diaphoretic.  Heart rate in the 120s.  Maintain SPO2 greater than 94.  She has persistent mild end expiratory wheezing.  Discussed with hospitalist who will admit patient for persistent, severe asthma/COPD  exacerbation.  Discussed with EDP.   Final Clinical Impressions(s) / ED Diagnoses   Final diagnoses:  Severe persistent asthma with exacerbation    ED Discharge Orders    None       Kinnie Feil, PA-C 05/21/19 1319    Hayden Rasmussen, MD 05/21/19 [phone]

## 2019-05-22 DIAGNOSIS — Y92230 Patient room in hospital as the place of occurrence of the external cause: Secondary | ICD-10-CM | POA: Diagnosis not present

## 2019-05-22 DIAGNOSIS — J4551 Severe persistent asthma with (acute) exacerbation: Secondary | ICD-10-CM | POA: Diagnosis present

## 2019-05-22 DIAGNOSIS — Z6841 Body Mass Index (BMI) 40.0 and over, adult: Secondary | ICD-10-CM | POA: Diagnosis not present

## 2019-05-22 DIAGNOSIS — Z9884 Bariatric surgery status: Secondary | ICD-10-CM | POA: Diagnosis not present

## 2019-05-22 DIAGNOSIS — I10 Essential (primary) hypertension: Secondary | ICD-10-CM | POA: Diagnosis present

## 2019-05-22 DIAGNOSIS — Z79899 Other long term (current) drug therapy: Secondary | ICD-10-CM | POA: Diagnosis not present

## 2019-05-22 DIAGNOSIS — Z87891 Personal history of nicotine dependence: Secondary | ICD-10-CM | POA: Diagnosis not present

## 2019-05-22 DIAGNOSIS — R7303 Prediabetes: Secondary | ICD-10-CM | POA: Diagnosis present

## 2019-05-22 DIAGNOSIS — Z96622 Presence of left artificial elbow joint: Secondary | ICD-10-CM | POA: Diagnosis present

## 2019-05-22 DIAGNOSIS — T380X5A Adverse effect of glucocorticoids and synthetic analogues, initial encounter: Secondary | ICD-10-CM | POA: Diagnosis not present

## 2019-05-22 DIAGNOSIS — J441 Chronic obstructive pulmonary disease with (acute) exacerbation: Secondary | ICD-10-CM | POA: Diagnosis present

## 2019-05-22 DIAGNOSIS — J45901 Unspecified asthma with (acute) exacerbation: Secondary | ICD-10-CM | POA: Diagnosis present

## 2019-05-22 DIAGNOSIS — Z8249 Family history of ischemic heart disease and other diseases of the circulatory system: Secondary | ICD-10-CM | POA: Diagnosis not present

## 2019-05-22 DIAGNOSIS — Z9851 Tubal ligation status: Secondary | ICD-10-CM | POA: Diagnosis not present

## 2019-05-22 DIAGNOSIS — E669 Obesity, unspecified: Secondary | ICD-10-CM | POA: Diagnosis present

## 2019-05-22 DIAGNOSIS — Z9682 Presence of neurostimulator: Secondary | ICD-10-CM | POA: Diagnosis not present

## 2019-05-22 DIAGNOSIS — Z79891 Long term (current) use of opiate analgesic: Secondary | ICD-10-CM | POA: Diagnosis not present

## 2019-05-22 DIAGNOSIS — I4581 Long QT syndrome: Secondary | ICD-10-CM | POA: Diagnosis present

## 2019-05-22 DIAGNOSIS — R0602 Shortness of breath: Secondary | ICD-10-CM | POA: Diagnosis present

## 2019-05-22 DIAGNOSIS — Z20828 Contact with and (suspected) exposure to other viral communicable diseases: Secondary | ICD-10-CM | POA: Diagnosis present

## 2019-05-22 DIAGNOSIS — Z7951 Long term (current) use of inhaled steroids: Secondary | ICD-10-CM | POA: Diagnosis not present

## 2019-05-22 DIAGNOSIS — R739 Hyperglycemia, unspecified: Secondary | ICD-10-CM | POA: Diagnosis not present

## 2019-05-22 DIAGNOSIS — G4733 Obstructive sleep apnea (adult) (pediatric): Secondary | ICD-10-CM | POA: Diagnosis present

## 2019-05-22 DIAGNOSIS — Z823 Family history of stroke: Secondary | ICD-10-CM | POA: Diagnosis not present

## 2019-05-22 HISTORY — DX: Unspecified asthma with (acute) exacerbation: J45.901

## 2019-05-22 LAB — COMPREHENSIVE METABOLIC PANEL
ALT: 18 U/L (ref 0–44)
AST: 13 U/L — ABNORMAL LOW (ref 15–41)
Albumin: 3.6 g/dL (ref 3.5–5.0)
Alkaline Phosphatase: 81 U/L (ref 38–126)
Anion gap: 11 (ref 5–15)
BUN: 19 mg/dL (ref 8–23)
CO2: 25 mmol/L (ref 22–32)
Calcium: 9.3 mg/dL (ref 8.9–10.3)
Chloride: 105 mmol/L (ref 98–111)
Creatinine, Ser: 0.63 mg/dL (ref 0.44–1.00)
GFR calc Af Amer: >60 mL/min (ref >60)
GFR calc non Af Amer: >60 mL/min (ref >60)
Glucose, Bld: 153 mg/dL — ABNORMAL HIGH (ref 70–99)
Potassium: 4.4 mmol/L (ref 3.5–5.1)
Sodium: 141 mmol/L (ref 135–145)
Total Bilirubin: 0.5 mg/dL (ref 0.3–1.2)
Total Protein: 6.5 g/dL (ref 6.5–8.1)

## 2019-05-22 LAB — CBC
HCT: 33.5 % — ABNORMAL LOW (ref 36.0–46.0)
Hemoglobin: 10.1 g/dL — ABNORMAL LOW (ref 12.0–15.0)
MCH: 25.4 pg — ABNORMAL LOW (ref 26.0–34.0)
MCHC: 30.1 g/dL (ref 30.0–36.0)
MCV: 84.2 fL (ref 80.0–100.0)
Platelets: 333 10*3/uL (ref 150–400)
RBC: 3.98 MIL/uL (ref 3.87–5.11)
RDW: 16.2 % — ABNORMAL HIGH (ref 11.5–15.5)
WBC: 13.3 10*3/uL — ABNORMAL HIGH (ref 4.0–10.5)
nRBC: 0 % (ref 0.0–0.2)

## 2019-05-22 MED ORDER — ALBUTEROL SULFATE (2.5 MG/3ML) 0.083% IN NEBU
2.5000 mg | INHALATION_SOLUTION | RESPIRATORY_TRACT | Status: DC | PRN
  Administered 2019-05-23: 2.5 mg via RESPIRATORY_TRACT
  Filled 2019-05-22: qty 3

## 2019-05-22 MED ORDER — BUDESONIDE 0.25 MG/2ML IN SUSP
0.2500 mg | Freq: Two times a day (BID) | RESPIRATORY_TRACT | Status: DC
  Administered 2019-05-22 – 2019-05-24 (×5): 0.25 mg via RESPIRATORY_TRACT
  Filled 2019-05-22 (×5): qty 2

## 2019-05-22 MED ORDER — GUAIFENESIN-DM 100-10 MG/5ML PO SYRP: 5.0000 mL | ORAL_SOLUTION | ORAL | Status: DC | PRN

## 2019-05-22 MED ORDER — GUAIFENESIN ER 600 MG PO TB12
1200.0000 mg | ORAL_TABLET | Freq: Two times a day (BID) | ORAL | Status: DC
  Administered 2019-05-22 – 2019-05-24 (×5): 1200 mg via ORAL
  Filled 2019-05-22 (×5): qty 2

## 2019-05-22 MED ORDER — IPRATROPIUM-ALBUTEROL 0.5-2.5 (3) MG/3ML IN SOLN
3.0000 mL | RESPIRATORY_TRACT | Status: DC
  Administered 2019-05-22 (×3): 3 mL via RESPIRATORY_TRACT
  Filled 2019-05-22 (×3): qty 3

## 2019-05-22 MED ORDER — IPRATROPIUM-ALBUTEROL 0.5-2.5 (3) MG/3ML IN SOLN
3.0000 mL | Freq: Four times a day (QID) | RESPIRATORY_TRACT | Status: DC
  Administered 2019-05-23 (×2): 3 mL via RESPIRATORY_TRACT
  Filled 2019-05-22 (×2): qty 3

## 2019-05-22 MED ORDER — ZOLPIDEM TARTRATE 5 MG PO TABS
5.0000 mg | ORAL_TABLET | Freq: Once | ORAL | Status: AC
  Administered 2019-05-22: 5 mg via ORAL
  Filled 2019-05-22: qty 1

## 2019-05-22 MED ORDER — ARFORMOTEROL TARTRATE 15 MCG/2ML IN NEBU
15.0000 ug | INHALATION_SOLUTION | Freq: Two times a day (BID) | RESPIRATORY_TRACT | Status: DC
  Administered 2019-05-22 – 2019-05-24 (×5): 15 ug via RESPIRATORY_TRACT
  Filled 2019-05-22 (×5): qty 2

## 2019-05-22 NOTE — Progress Notes (Signed)
[facility]                                                                              Patient Demographics  Patty Howard, is a 63 y.o. female, DOB - [birth year], NOT:[id number]  Admit date - 05/21/2019   Admitting Physician Costin Karlyne Greenspan, MD  Outpatient Primary MD for the patient is Sandi Mariscal, MD  Outpatient specialists:   LOS - 0  days   Medical records reviewed and are as summarized below:    Chief Complaint  Patient presents with  . Shortness of Breath       Brief summary   Patient is a 63 year old female with history of asthma, COPD, hypertension, OSA not wearing CPAP obesity presented with shortness of breath.  Patient reports feeling short of breath with minimal activities for past couple of weeks.  She has been in ED a week ago and was tested negative for Covid, given prednisone, antibiotic course however did not feel any improvement and presented back.  Also reported productive cough with thick greenish-yellow sputum that gets stuck in her throat and makes her quite short of breath.  In ED temp of 100.2 F, tachypneic, tachycardiac, O2 sats 95% to 100% on room air  Chest x-ray showed COPD without acute findings.  Patient was admitted for COPD exacerbation.  COVID-19 negative  Assessment & Plan    Principal Problem: Acute COPD exacerbation ([place]) -Still diffuse wheezing bilaterally, congested and coughing -Given purulent sputum, was placed on Levaquin and IV steroids, will continue -Placed on scheduled duo nebs every 4 hours, Pulmicort, Brovana, flutter valve -She will benefit from outpatient pulmonology referral  Active Problems:   Prediabetes -Monitor CBGs with IV steroids    Hypertension, essential, benign - BP stable     Obesity Estimated body mass index is 40.89 kg/m as calculated from the following:   Height as of this encounter: 5' 10"  (1.778 m).   Weight as of this encounter: 129.3 kg.  -Recommended diet and weight  control  Code Status: Full CODE STATUS DVT Prophylaxis: Lovenox Family Communication: Discussed all imaging results, lab results, explained to the patient    Disposition Plan: Diffuse wheezing bilaterally, not ready for discharge.  Hopefully DC in next 24 to 48 hours if improving.  Remains inpatient.  Time Spent in minutes   35 minutes  Procedures:  None  Consultants:   None  Antimicrobials:   Anti-infectives (From admission, onward)   Start     Dose/Rate Route Frequency Ordered Stop   05/21/19 1700  levofloxacin (LEVAQUIN) tablet 750 mg     750 mg Oral Daily 05/21/19 1558           Medications  Scheduled Meds: . arformoterol  15 mcg Nebulization BID  . budesonide (PULMICORT) nebulizer solution  0.25 mg Nebulization BID  . enoxaparin (LOVENOX) injection  60 mg Subcutaneous Q24H  . guaiFENesin  1,200 mg Oral BID  . losartan  100 mg Oral Daily   And  . hydrochlorothiazide  25 mg Oral Daily  . ipratropium-albuterol  3 mL Nebulization Q4H  . levofloxacin  750 mg Oral Daily  . loratadine  10 mg Oral QPM  . methylPREDNISolone (SOLU-MEDROL) injection  60 mg Intravenous Q12H  . pantoprazole  40 mg Oral Daily   Continuous Infusions: PRN Meds:.acetaminophen **OR** acetaminophen, albuterol, guaiFENesin-dextromethorphan, ondansetron (ZOFRAN) IV, oxyCODONE-acetaminophen      Subjective:   Geneive Sandstrom was seen and examined today.  Still coughing, congested, bilateral diffuse expiratory wheezing. + Shortness of breath.  Patient denies dizziness, chest pain,  abdominal pain, N/V/D/C, new weakness, numbess, tingling. No acute events overnight.    Objective:   Vitals:   05/21/19 1939 05/21/19 2108 05/22/19 0542 05/22/19 0755  BP:  (!) 145/98 (!) 147/87   Pulse:  93 74   Resp:  15 15   Temp:  98.2 F (36.8 C) 98.5 F (36.9 C)   TempSrc:  Oral Oral   SpO2: 96% 98% 98% 98%  Weight:      Height:       No intake or output data in the 24 hours ending 05/22/19  1041   Wt Readings from Last 3 Encounters:  05/21/19 129.3 kg  05/15/19 129.3 kg  05/02/19 133.4 kg     Exam  General: Alert and oriented x 3, NAD  Eyes:  HEENT:  Atraumatic, normocephalic, normal oropharynx  Cardiovascular: S1 S2 auscultated, no murmurs, RRR  Respiratory: Bilateral diffuse expiratory wheezing  Gastrointestinal: Obese, soft, nontender, nondistended, + bowel sounds  Ext: no pedal edema bilaterally  Neuro: No new deficits  Musculoskeletal: No digital cyanosis, clubbing  Skin: No rashes  Psych: Normal affect and demeanor, alert and oriented x3    Data Reviewed:  I have personally reviewed following labs and imaging studies  Micro Results Recent Results (from the past 240 hour(s))  Novel Coronavirus, NAA (Hosp order, Send-out to Ref Lab; TAT 18-24 hrs     Status: None   Collection Time: 05/15/19 10:14 AM   Specimen: Nasopharyngeal Swab; Respiratory  Result Value Ref Range Status   SARS-CoV-2, NAA NOT DETECTED NOT DETECTED Final    Comment: (NOTE) This nucleic acid amplification test was developed and its performance characteristics determined by [employer]. Nucleic acid amplification tests include PCR and TMA. This test has not been FDA cleared or approved. This test has been authorized by FDA under an Emergency Use Authorization (EUA). This test is only authorized for the duration of time the declaration that circumstances exist justifying the authorization of the emergency use of in vitro diagnostic tests for detection of SARS-CoV-2 virus and/or diagnosis of COVID-19 infection under section 564(b)(1) of the Act, 21 U.S.C. 081KGY-1(E) (1), unless the authorization is terminated or revoked sooner. When diagnostic testing is negative, the possibility of a false negative result should be considered in the context of a patient's recent exposures and the presence of clinical signs and symptoms consistent with COVID-19. An individual  without symptoms of COVID- 19 and who is not shedding SARS-CoV-2 vi rus would expect to have a negative (not detected) result in this assay. Performed At: [facility] [place], [place] [id number] Rush Farmer MD OV:[id number]    [place]  Final    Comment: Performed at [facility] [address] [place], [place] [address]  SARS CORONAVIRUS 2 (TAT 6-24 HRS) Nasopharyngeal Nasopharyngeal Swab     Status: None   Collection Time: 05/21/19 11:34 AM   Specimen: Nasopharyngeal Swab  Result Value Ref Range Status   SARS Coronavirus 2 NEGATIVE NEGATIVE Final    Comment: (NOTE) SARS-CoV-2 target nucleic acids are NOT DETECTED. The SARS-CoV-2  RNA is generally detectable in upper and lower respiratory specimens during the acute phase of infection. Negative results do not preclude SARS-CoV-2 infection, do not rule out co-infections with other pathogens, and should not be used as the sole basis for treatment or other patient management decisions. Negative results must be combined with clinical observations, patient history, and epidemiological information. The expected result is Negative. Fact Sheet for Patients: SugarRoll.be Fact Sheet for Healthcare Providers: https://www.woods-mathews.com/ This test is not yet approved or cleared by the [place] FDA and  has been authorized for detection and/or diagnosis of SARS-CoV-2 by FDA under an Emergency Use Authorization (EUA). This EUA will remain  in effect (meaning this test can be used) for the duration of the COVID-19 declaration under Section 56 4(b)(1) of the Act, 21 U.S.C. section 360bbb-3(b)(1), unless the authorization is terminated or revoked sooner. Performed at [facility], [place] [address], [place], [place] [address]     Radiology Reports Dg Chest 2 View  Result Date: 05/21/2019 CLINICAL DATA:  Shortness of  breath.  Asthma.  COPD. EXAM: CHEST - 2 VIEW COMPARISON:  05/15/2019 FINDINGS: The heart size and mediastinal contours are within normal limits. Aortic atherosclerosis. Pulmonary hyperinflation is again seen, consistent with COPD. Both lungs are clear. The visualized skeletal structures are unremarkable. IMPRESSION: COPD.  No active cardiopulmonary disease. Electronically Signed   By: Marlaine Hind M.D.   On: 05/21/2019 07:15   Dg Chest 2 View  Result Date: 05/15/2019 CLINICAL DATA:  Worsening cough and shortness of breath over the past week. History of asthma. EXAM: CHEST - 2 VIEW COMPARISON:  12/19/2018 FINDINGS: The cardiac silhouette, mediastinal and hilar contours are within normal limits and stable. The lungs demonstrate chronic hyperinflation. No infiltrates or effusions. No worrisome pulmonary lesions. The bony thorax is intact. IMPRESSION: Stable hyperinflation but no acute pulmonary findings. Electronically Signed   By: Marijo Sanes M.D.   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     Lab Data:  CBC: Recent Labs  Lab 05/21/19 0711 05/22/19 0540  WBC 18.0* 13.3*  NEUTROABS 14.6*  --   HGB 11.6* 10.1*  HCT 37.7 33.5*  MCV 84.5 84.2  PLT 359 301   Basic Metabolic Panel: Recent Labs  Lab 05/21/19 0711 05/22/19 0540  NA 139 141  K 4.5 4.4  CL 100 105  CO2 26 25  GLUCOSE 98 153*  BUN 17 19  CREATININE 0.72 0.63  CALCIUM 9.4 9.3   GFR: Estimated Creatinine Clearance: 102.7 mL/min (by C-G formula based on SCr of 0.63 mg/dL). Liver Function Tests: Recent Labs  Lab 05/22/19 0540  AST 13*  ALT 18  ALKPHOS 81  BILITOT 0.5  PROT 6.5  ALBUMIN 3.6   No results for input(s): LIPASE, AMYLASE in the last 168 hours. No results for input(s): AMMONIA in the last 168 hours. Coagulation Profile: No results for input(s): INR, PROTIME in the last 168 hours. Cardiac Enzymes: No results for  input(s): CKTOTAL, CKMB, CKMBINDEX, TROPONINI in the last 168 hours. BNP (last 3 results) No results for input(s): PROBNP in the last 8760 hours. HbA1C: No results for input(s): HGBA1C in the last 72 hours. CBG: No results for input(s): GLUCAP in the last 168 hours. Lipid Profile: No results for input(s): CHOL, HDL, LDLCALC, TRIG, CHOLHDL, LDLDIRECT in the last 72 hours. Thyroid Function Tests: No results for input(s): TSH, T4TOTAL,  FREET4, T3FREE, THYROIDAB in the last 72 hours. Anemia Panel: No results for input(s): VITAMINB12, FOLATE, FERRITIN, TIBC, IRON, RETICCTPCT in the last 72 hours. Urine analysis:    Component Value Date/Time   COLORURINE YELLOW 10/15/2018 1948   APPEARANCEUR HAZY (A) 10/15/2018 1948   LABSPEC 1.028 10/15/2018 1948   PHURINE 5.0 10/15/2018 1948   GLUCOSEU NEGATIVE 10/15/2018 1948   HGBUR NEGATIVE 10/15/2018 [place] NEGATIVE 10/15/2018 1948   KETONESUR 5 (A) 10/15/2018 [place] NEGATIVE 10/15/2018 1948   NITRITE NEGATIVE 10/15/2018 1948   LEUKOCYTESUR MODERATE (A) 10/15/2018 1948     Ripudeep Rai M.D. [facility] 05/22/2019, 10:41 AM   Call night coverage person covering after 7pm

## 2019-05-23 LAB — HEMOGLOBIN A1C
Hgb A1c MFr Bld: 6.3 % — ABNORMAL HIGH (ref 4.8–5.6)
Mean Plasma Glucose: 134.11 mg/dL

## 2019-05-23 LAB — BASIC METABOLIC PANEL
Anion gap: 9 (ref 5–15)
BUN: 26 mg/dL — ABNORMAL HIGH (ref 8–23)
CO2: 25 mmol/L (ref 22–32)
Calcium: 9.3 mg/dL (ref 8.9–10.3)
Chloride: 103 mmol/L (ref 98–111)
Creatinine, Ser: 0.8 mg/dL (ref 0.44–1.00)
GFR calc Af Amer: >60 mL/min (ref >60)
GFR calc non Af Amer: >60 mL/min (ref >60)
Glucose, Bld: 145 mg/dL — ABNORMAL HIGH (ref 70–99)
Potassium: 4.2 mmol/L (ref 3.5–5.1)
Sodium: 137 mmol/L (ref 135–145)

## 2019-05-23 LAB — CBC
HCT: 34.4 % — ABNORMAL LOW (ref 36.0–46.0)
Hemoglobin: 10.4 g/dL — ABNORMAL LOW (ref 12.0–15.0)
MCH: 25.4 pg — ABNORMAL LOW (ref 26.0–34.0)
MCHC: 30.2 g/dL (ref 30.0–36.0)
MCV: 84.1 fL (ref 80.0–100.0)
Platelets: 348 10*3/uL (ref 150–400)
RBC: 4.09 MIL/uL (ref 3.87–5.11)
RDW: 16.5 % — ABNORMAL HIGH (ref 11.5–15.5)
WBC: 14.8 10*3/uL — ABNORMAL HIGH (ref 4.0–10.5)
nRBC: 0 % (ref 0.0–0.2)

## 2019-05-23 LAB — GLUCOSE, CAPILLARY
Glucose-Capillary: 141 mg/dL — ABNORMAL HIGH (ref 70–99)
Glucose-Capillary: 139 mg/dL — ABNORMAL HIGH (ref 70–99)

## 2019-05-23 MED ORDER — ZOLPIDEM TARTRATE 5 MG PO TABS
5.0000 mg | ORAL_TABLET | Freq: Every evening | ORAL | Status: DC | PRN
  Administered 2019-05-23: 5 mg via ORAL
  Filled 2019-05-23: qty 1

## 2019-05-23 MED ORDER — IPRATROPIUM-ALBUTEROL 0.5-2.5 (3) MG/3ML IN SOLN
3.0000 mL | Freq: Four times a day (QID) | RESPIRATORY_TRACT | Status: DC
  Administered 2019-05-23 – 2019-05-24 (×3): 3 mL via RESPIRATORY_TRACT
  Filled 2019-05-23 (×3): qty 3

## 2019-05-23 MED ORDER — INSULIN ASPART 100 UNIT/ML ~~LOC~~ SOLN: 0.0000 [IU] | Freq: Three times a day (TID) | SUBCUTANEOUS | Status: DC

## 2019-05-23 MED ORDER — DOXYCYCLINE HYCLATE 100 MG PO TABS
100.0000 mg | ORAL_TABLET | Freq: Two times a day (BID) | ORAL | Status: DC
  Administered 2019-05-23 – 2019-05-24 (×2): 100 mg via ORAL
  Filled 2019-05-23 (×3): qty 1

## 2019-05-23 MED ORDER — IPRATROPIUM-ALBUTEROL 0.5-2.5 (3) MG/3ML IN SOLN: 3.0000 mL | Freq: Three times a day (TID) | RESPIRATORY_TRACT | Status: DC

## 2019-05-23 NOTE — Progress Notes (Signed)
[facility]                                                                              Patient Demographics  Patty Howard, is a 63 y.o. female, DOB - 11-Oct-1955, ACZ:[id number]  Admit date - 05/21/2019   Admitting Physician Costin Karlyne Greenspan, MD  Outpatient Primary MD for the patient is Sandi Mariscal, MD  Outpatient specialists:   LOS - 1  days   Medical records reviewed and are as summarized below:    Chief Complaint  Patient presents with  . Shortness of Breath       Brief summary   Patient is a 63 year old female with history of asthma, COPD, hypertension, OSA not wearing CPAP obesity presented with shortness of breath.  Patient reports feeling short of breath with minimal activities for past couple of weeks.  She has been in ED a week ago and was tested negative for Covid, given prednisone, antibiotic course however did not feel any improvement and presented back.  Also reported productive cough with thick greenish-yellow sputum that gets stuck in her throat and makes her quite short of breath.  In ED temp of 100.2 F, tachypneic, tachycardiac, O2 sats 95% to 100% on room air  Chest x-ray showed COPD without acute findings.  Patient was admitted for COPD exacerbation.  COVID-19 negative  Assessment & Plan    Principal Problem: Acute COPD exacerbation ([place]) -Given purulent sputum, was placed on Levaquin and IV steroids, will continue -Still wheezing bilaterally, continue scheduled nebs every 4 hours, Pulmicort, Brovana, flutter valve.   -Needs ambulatory referral to pulmonology at the time of discharge -Continue IV Solu-Medrol 60 mg every 12 hours, taper tomorrow if improving  Active Problems:   Prediabetes -CBGs trending up, likely due to IV steroids - placed on sensitive sliding scale insulin, obtain hemoglobin A1c Diet changed to carb modified    Hypertension, essential, benign -BP stable     Obesity Estimated body mass index is 40.89  kg/m as calculated from the following:   Height as of this encounter: 5' 10"  (1.778 m).   Weight as of this encounter: 129.3 kg.  -Recommended diet and weight control  Code Status: Full CODE STATUS DVT Prophylaxis: Lovenox Family Communication: Discussed all imaging results, lab results, explained to the patient    Disposition Plan: Still wheezing bilaterally, not ready for discharge. Time Spent in minutes   25 minutes  Procedures:  None  Consultants:   None  Antimicrobials:   Anti-infectives (From admission, onward)   Start     Dose/Rate Route Frequency Ordered Stop   05/21/19 1700  levofloxacin (LEVAQUIN) tablet 750 mg     750 mg Oral Daily 05/21/19 1558           Medications  Scheduled Meds: . arformoterol  15 mcg Nebulization BID  . budesonide (PULMICORT) nebulizer solution  0.25 mg Nebulization BID  . enoxaparin (LOVENOX) injection  60 mg Subcutaneous Q24H  . guaiFENesin  1,200 mg Oral BID  . losartan  100 mg Oral Daily   And  . hydrochlorothiazide  25 mg Oral Daily  . ipratropium-albuterol  3 mL Nebulization  QID  . levofloxacin  750 mg Oral Daily  . loratadine  10 mg Oral QPM  . methylPREDNISolone (SOLU-MEDROL) injection  60 mg Intravenous Q12H  . pantoprazole  40 mg Oral Daily   Continuous Infusions: PRN Meds:.acetaminophen **OR** acetaminophen, albuterol, guaiFENesin-dextromethorphan, ondansetron (ZOFRAN) IV, oxyCODONE-acetaminophen      Subjective:   Patty Howard was seen and examined today.  Still coughing and bringing up phlegm, states now the phlegm is becoming more whitish.  States shortness of breath is improving but still wheezing bilaterally.  Patient denies dizziness, chest pain,  abdominal pain, N/V/D/C, new weakness, numbess, tingling. No acute events overnight.    Objective:   Vitals:   05/22/19 2031 05/23/19 0601 05/23/19 0726 05/23/19 1120  BP: (!) 150/96 (!) 143/89    Pulse: 80 68    Resp: 16 16    Temp: 98.1 F (36.7  C) 98.6 F (37 C)    TempSrc: Oral Oral    SpO2: 95% 100% 97% 98%  Weight:      Height:        Intake/Output Summary (Last 24 hours) at 05/23/2019 1415 Last data filed at 05/23/2019 1123 Gross per 24 hour  Intake 2192 ml  Output 1775 ml  Net 417 ml     Wt Readings from Last 3 Encounters:  05/21/19 129.3 kg  05/15/19 129.3 kg  05/02/19 133.4 kg    Physical Exam  General: Alert and oriented x 3, NAD  Eyes:   HEENT:  Atraumatic, normocephalic  Cardiovascular: S1 S2 clear, no murmurs, RRR. No pedal edema b/l  Respiratory: Bilateral expiratory wheezing  Gastrointestinal: BS, soft, nontender, nondistended, NBS  Ext: no pedal edema bilaterally  Neuro: no new deficits  Musculoskeletal: No cyanosis, clubbing  Skin: No rashes  Psych: Normal affect and demeanor, alert and oriented x3    Data Reviewed:  I have personally reviewed following labs and imaging studies  Micro Results Recent Results (from the past 240 hour(s))  Novel Coronavirus, NAA (Hosp order, Send-out to Ref Lab; TAT 18-24 hrs     Status: None   Collection Time: 05/15/19 10:14 AM   Specimen: Nasopharyngeal Swab; Respiratory  Result Value Ref Range Status   SARS-CoV-2, NAA NOT DETECTED NOT DETECTED Final    Comment: (NOTE) This nucleic acid amplification test was developed and its performance characteristics determined by [employer]. Nucleic acid amplification tests include PCR and TMA. This test has not been FDA cleared or approved. This test has been authorized by FDA under an Emergency Use Authorization (EUA). This test is only authorized for the duration of time the declaration that circumstances exist justifying the authorization of the emergency use of in vitro diagnostic tests for detection of SARS-CoV-2 virus and/or diagnosis of COVID-19 infection under section 564(b)(1) of the Act, 21 U.S.C. 355DDU-2(G) (1), unless the authorization is terminated or revoked sooner. When  diagnostic testing is negative, the possibility of a false negative result should be considered in the context of a patient's recent exposures and the presence of clinical signs and symptoms consistent with COVID-19. An individual without symptoms of COVID- 19 and who is not shedding SARS-CoV-2 vi rus would expect to have a negative (not detected) result in this assay. Performed At: [facility] [place], [place] [id number] Rush Farmer MD JS:[id number]    [place]  Final    Comment: Performed at [facility] [address] [place], [place] [address]  SARS CORONAVIRUS 2 (TAT 6-24 HRS) Nasopharyngeal Nasopharyngeal  Swab     Status: None   Collection Time: 05/21/19 11:34 AM   Specimen: Nasopharyngeal Swab  Result Value Ref Range Status   SARS Coronavirus 2 NEGATIVE NEGATIVE Final    Comment: (NOTE) SARS-CoV-2 target nucleic acids are NOT DETECTED. The SARS-CoV-2 RNA is generally detectable in upper and lower respiratory specimens during the acute phase of infection. Negative results do not preclude SARS-CoV-2 infection, do not rule out co-infections with other pathogens, and should not be used as the sole basis for treatment or other patient management decisions. Negative results must be combined with clinical observations, patient history, and epidemiological information. The expected result is Negative. Fact Sheet for Patients: SugarRoll.be Fact Sheet for Healthcare Providers: https://www.woods-mathews.com/ This test is not yet approved or cleared by the [place] FDA and  has been authorized for detection and/or diagnosis of SARS-CoV-2 by FDA under an Emergency Use Authorization (EUA). This EUA will remain  in effect (meaning this test can be used) for the duration of the COVID-19 declaration under Section 56 4(b)(1) of the Act, 21 U.S.C. section 360bbb-3(b)(1),  unless the authorization is terminated or revoked sooner. Performed at [facility], [place] [address] [place], [place] [address]   Blood culture (routine x 2)     Status: None (Preliminary result)   Collection Time: 05/21/19 12:34 PM   Specimen: BLOOD  Result Value Ref Range Status   Specimen Description   Final    BLOOD LEFT ARM Performed at [facility] [address], [place], [place] [address]    Special Requests   Final    BOTTLES DRAWN AEROBIC AND ANAEROBIC Blood Culture adequate volume Performed at [facility] [address], [place], [place] [address]    Culture   Final    NO GROWTH 2 DAYS Performed at [facility] [address] [place], [place] [address]    Report Status PENDING  Incomplete  Blood culture (routine x 2)     Status: None (Preliminary result)   Collection Time: 05/21/19 12:34 PM   Specimen: BLOOD  Result Value Ref Range Status   Specimen Description   Final    BLOOD LEFT HAND Performed at [facility] [address], [place], [place] [address]    Special Requests   Final    BOTTLES DRAWN AEROBIC AND ANAEROBIC Blood Culture adequate volume Performed at [facility] [address] [place], [place] [address]    Culture   Final    NO GROWTH 2 DAYS Performed at [facility] [address], [place], [place] [address]    Report Status PENDING  Incomplete    Radiology Reports Dg Chest 2 View  Result Date: 05/21/2019 CLINICAL DATA:  Shortness of breath.  Asthma.  COPD. EXAM: CHEST - 2 VIEW COMPARISON:  05/15/2019 FINDINGS: The heart size and mediastinal contours are within normal limits. Aortic atherosclerosis. Pulmonary hyperinflation is again seen, consistent with COPD. Both lungs are clear. The visualized skeletal structures are unremarkable. IMPRESSION: COPD.  No active cardiopulmonary disease. Electronically Signed   By: Marlaine Hind M.D.   On:  05/21/2019 07:15   Dg Chest 2 View  Result Date: 05/15/2019 CLINICAL DATA:  Worsening cough and shortness of breath over the past week. History of asthma. EXAM: CHEST - 2 VIEW COMPARISON:  12/19/2018 FINDINGS: The cardiac silhouette, mediastinal and hilar contours are within normal limits and stable. The lungs demonstrate chronic hyperinflation. No infiltrates or effusions.  No worrisome pulmonary lesions. The bony thorax is intact. IMPRESSION: Stable hyperinflation but no acute pulmonary findings. Electronically Signed   By: Marijo Sanes M.D.   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     Lab Data:  CBC: Recent Labs  Lab 05/21/19 0711 05/22/19 0540 05/23/19 0622  WBC 18.0* 13.3* 14.8*  NEUTROABS 14.6*  --   --   HGB 11.6* 10.1* 10.4*  HCT 37.7 33.5* 34.4*  MCV 84.5 84.2 84.1  PLT 359 333 828   Basic Metabolic Panel: Recent Labs  Lab 05/21/19 0711 05/22/19 0540 05/23/19 0622  NA 139 141 137  K 4.5 4.4 4.2  CL 100 105 103  CO2 26 25 25   GLUCOSE 98 153* 145*  BUN 17 19 26*  CREATININE 0.72 0.63 0.80  CALCIUM 9.4 9.3 9.3   GFR: Estimated Creatinine Clearance: 102.7 mL/min (by C-G formula based on SCr of 0.8 mg/dL). Liver Function Tests: Recent Labs  Lab 05/22/19 0540  AST 13*  ALT 18  ALKPHOS 81  BILITOT 0.5  PROT 6.5  ALBUMIN 3.6   No results for input(s): LIPASE, AMYLASE in the last 168 hours. No results for input(s): AMMONIA in the last 168 hours. Coagulation Profile: No results for input(s): INR, PROTIME in the last 168 hours. Cardiac Enzymes: No results for input(s): CKTOTAL, CKMB, CKMBINDEX, TROPONINI in the last 168 hours. BNP (last 3 results) No results for input(s): PROBNP in the last 8760 hours. HbA1C: No results for input(s): HGBA1C in the last 72 hours. CBG: No results for input(s): GLUCAP in the last 168 hours. Lipid Profile: No results for input(s):  CHOL, HDL, LDLCALC, TRIG, CHOLHDL, LDLDIRECT in the last 72 hours. Thyroid Function Tests: No results for input(s): TSH, T4TOTAL, FREET4, T3FREE, THYROIDAB in the last 72 hours. Anemia Panel: No results for input(s): VITAMINB12, FOLATE, FERRITIN, TIBC, IRON, RETICCTPCT in the last 72 hours. Urine analysis:    Component Value Date/Time   COLORURINE YELLOW 10/15/2018 1948   APPEARANCEUR HAZY (A) 10/15/2018 1948   LABSPEC 1.028 10/15/2018 1948   PHURINE 5.0 10/15/2018 1948   GLUCOSEU NEGATIVE 10/15/2018 1948   HGBUR NEGATIVE 10/15/2018 [place] NEGATIVE 10/15/2018 1948   KETONESUR 5 (A) 10/15/2018 [place] NEGATIVE 10/15/2018 1948   NITRITE NEGATIVE 10/15/2018 1948   LEUKOCYTESUR MODERATE (A) 10/15/2018 1948     Tecumseh Yeagley M.D. [facility] 05/23/2019, 2:15 PM   Call night coverage person covering after 7pm

## 2019-05-23 NOTE — Progress Notes (Signed)
Patient refused peripheral iv

## 2019-05-23 NOTE — Progress Notes (Signed)
Patty Howard, Patty Howard, COPD, has been getting Ambien nightly and is asking for 22m of Ambien tonight for sleep.

## 2019-05-24 LAB — BASIC METABOLIC PANEL
Anion gap: 10 (ref 5–15)
BUN: 25 mg/dL — ABNORMAL HIGH (ref 8–23)
CO2: 26 mmol/L (ref 22–32)
Calcium: 9.4 mg/dL (ref 8.9–10.3)
Chloride: 104 mmol/L (ref 98–111)
Creatinine, Ser: 0.69 mg/dL (ref 0.44–1.00)
GFR calc Af Amer: >60 mL/min (ref >60)
GFR calc non Af Amer: >60 mL/min (ref >60)
Glucose, Bld: 133 mg/dL — ABNORMAL HIGH (ref 70–99)
Potassium: 4.1 mmol/L (ref 3.5–5.1)
Sodium: 140 mmol/L (ref 135–145)

## 2019-05-24 LAB — CBC
HCT: 36.7 % (ref 36.0–46.0)
Hemoglobin: 11.1 g/dL — ABNORMAL LOW (ref 12.0–15.0)
MCH: 25.8 pg — ABNORMAL LOW (ref 26.0–34.0)
MCHC: 30.2 g/dL (ref 30.0–36.0)
MCV: 85.2 fL (ref 80.0–100.0)
Platelets: 386 10*3/uL (ref 150–400)
RBC: 4.31 MIL/uL (ref 3.87–5.11)
RDW: 16.8 % — ABNORMAL HIGH (ref 11.5–15.5)
WBC: 14.9 10*3/uL — ABNORMAL HIGH (ref 4.0–10.5)
nRBC: 0 % (ref 0.0–0.2)

## 2019-05-24 LAB — GLUCOSE, CAPILLARY
Glucose-Capillary: 151 mg/dL — ABNORMAL HIGH (ref 70–99)
Glucose-Capillary: 103 mg/dL — ABNORMAL HIGH (ref 70–99)

## 2019-05-24 MED ORDER — PREDNISONE 50 MG PO TABS
50.0000 mg | ORAL_TABLET | Freq: Every day | ORAL | Status: DC
  Administered 2019-05-24: 50 mg via ORAL
  Filled 2019-05-24: qty 1

## 2019-05-24 MED ORDER — PREDNISONE 50 MG PO TABS: 50.0000 mg | ORAL_TABLET | Freq: Every day | ORAL | 0 refills | Status: AC

## 2019-05-24 MED ORDER — DOXYCYCLINE HYCLATE 100 MG PO TABS: 100.0000 mg | ORAL_TABLET | Freq: Two times a day (BID) | ORAL | 0 refills | Status: AC

## 2019-05-24 MED ORDER — PERFOROMIST 20 MCG/2ML IN NEBU: 20.0000 ug | INHALATION_SOLUTION | Freq: Two times a day (BID) | RESPIRATORY_TRACT | 5 refills | Status: DC

## 2019-05-24 MED ORDER — BUDESONIDE 0.5 MG/2ML IN SUSP: 0.5000 mg | Freq: Two times a day (BID) | RESPIRATORY_TRACT | 5 refills | Status: DC

## 2019-05-24 NOTE — Progress Notes (Signed)
Pts IV removed with a clean and dry dressing intact. Pt denies pain at this time with no s/s of distress noted. Pt educated on d/c instructions, medications, and follow up appointments with all questions answered at that time. Pt verbalizes understanding.

## 2019-05-24 NOTE — Discharge Summary (Signed)
Physician Discharge Summary  Patty Howard GGE:[id number] DOB: [birth year] DOA: 05/21/2019  PCP: Sandi Mariscal, MD  Admit date: 05/21/2019 Discharge date: 05/24/2019   Code Status: Full Code  Admitted From: Home Discharged to: [place]: None Equipment/Devices: None Discharge Condition: Stable  Recommendations for Outpatient Follow-up   1. Follow up with PCP in 1 week 2. Refer to outpatient pulmonary to establish care at patient's request  Hospital Summary  This is a 63 year old female past medical history of asthma, COPD, hypertension, OSA not on CPAP, obesity who presented to the ED with shortness of breath and admitted on 12/6.  She had recently been in the ED roughly 1 week ago and tested negative for Covid, given prednisone and an antibiotic course however did not have any improvement in her symptoms and presented back to the ED with symptoms as above.  She also had a productive cough at that time.  In ED: T1 100 point 37F, tachypneic, tachycardic, SPO2 95% on room air.  CXR with findings consistent with COPD without any acute findings and admitted for COPD exacerbation.  COVID-19 test negative.  Initially patient was on Levaquin x2 days switch to doxycycline and IV Solu-Medrol and she had improvement in her symptoms.  She was able to ambulate on room air.  Discharged with doxycycline and prednisone for total 5 days doxycycline and 5 days of prednisone well as refills of her home belies her treatments.  A & P   Principal Problem:   COPD exacerbation (HCC) Active Problems:   Prediabetes   Hypertension, essential, benign   Obesity   Acute exacerbation of chronic obstructive pulmonary disease (COPD) (HCC)  Acute COPD exacerbation, likely viral etiology -Purulent sputum, received Levaquin x2 days which was changed to doxycycline 100 mg twice daily which she received 1 full day dose.  Improved with IV Solu-Medrol and switch to prednisone 50 mg daily on 12/9. -Hemodynamically  stable on room air with significant improvement in symptoms and tolerating ambulation -To complete 5-day course of doxycycline and prednisone outpatient and advised to continue her home nebulizer treatments  Prediabetes with steroid-induced hyperglycemia -Monitor outpatient    Hypertension, essential, benign -Continue home antihypertensives  COPD -Continue Pulmicort, formoterol, DuoNeb and albuterol inhaler as needed -Patient requested ambulatory referral to pulmonology to establish care as [place]    Obesity Estimated body mass index is 40.89 kg/m as calculated from the following:   Height as of this encounter: 5' 10"  (1.778 m).   Weight as of this encounter: 129.3 kg.  -Recommended diet and weight control     Consultants  . None  Procedures  . None  Antibiotics  Levaquin 12/6-> 12/7 Doxycycline 12/7-> 12/13   Subjective  Patient examined sitting upright in chair at bedside in no acute distress resting comfortably.  She states her symptoms have significantly improved.  Recently moved from [place] to the area a few months ago and wishes to establish care with a pulmonologist.  Admits to improved cough, wheeze.  Admits to being able to ambulate on room air.  Denies any other complaints at this time   Objective   Discharge Exam: Vitals:   05/24/19 1034 05/24/19 1142  BP: (!) 156/85   Pulse: 95   Resp: 20   Temp: 98.2 F (36.8 C)   SpO2: 100% 99%   Vitals:   05/24/19 0638 05/24/19 0739 05/24/19 1034 05/24/19 1142  BP: (!) 153/83  (!) 156/85   Pulse: 67  95   Resp: 16  20   Temp: 98.2 F (36.8 C)  98.2 F (36.8 C)   TempSrc: Oral  Oral   SpO2: 100% 98% 100% 99%  Weight:      Height:        Physical Exam Vitals signs and nursing note reviewed.  Constitutional:      Appearance: Normal appearance. She is obese.  HENT:     Head: Normocephalic and atraumatic.     Nose: Nose normal.     Mouth/Throat:      Mouth: Mucous membranes are moist.  Eyes:     Extraocular Movements: Extraocular movements intact.  Neck:     Musculoskeletal: Normal range of motion. No neck rigidity.  Cardiovascular:     Rate and Rhythm: Normal rate and regular rhythm.  Pulmonary:     Effort: Pulmonary effort is normal.     Comments: Mild diffuse end expiratory wheeze Abdominal:     General: Abdomen is flat.     Palpations: Abdomen is soft.  Musculoskeletal:        General: No swelling.     Right lower leg: She exhibits no tenderness. No edema.     Left lower leg: She exhibits no tenderness. No edema.  Neurological:     General: No focal deficit present.     Mental Status: She is alert. Mental status is at baseline.  Psychiatric:        Mood and Affect: Mood normal.        Behavior: Behavior normal.       The results of significant diagnostics from this hospitalization (including imaging, microbiology, ancillary and laboratory) are listed below for reference.     Microbiology: Recent Results (from the past 240 hour(s))  Novel Coronavirus, NAA (Hosp order, Send-out to Ref Lab; TAT 18-24 hrs     Status: None   Collection Time: 05/15/19 10:14 AM   Specimen: Nasopharyngeal Swab; Respiratory  Result Value Ref Range Status   SARS-CoV-2, NAA NOT DETECTED NOT DETECTED Final    Comment: (NOTE) This nucleic acid amplification test was developed and its performance characteristics determined by [employer]. Nucleic acid amplification tests include PCR and TMA. This test has not been FDA cleared or approved. This test has been authorized by FDA under an Emergency Use Authorization (EUA). This test is only authorized for the duration of time the declaration that circumstances exist justifying the authorization of the emergency use of in vitro diagnostic tests for detection of SARS-CoV-2 virus and/or diagnosis of COVID-19 infection under section 564(b)(1) of the Act, 21 U.S.C. 836OQH-4(T) (1), unless the  authorization is terminated or revoked sooner. When diagnostic testing is negative, the possibility of a false negative result should be considered in the context of a patient's recent exposures and the presence of clinical signs and symptoms consistent with COVID-19. An individual without symptoms of COVID- 19 and who is not shedding SARS-CoV-2 vi rus would expect to have a negative (not detected) result in this assay. Performed At: [facility] [place], [place] [id number] Rush Farmer MD SF:[id number]    [place]  Final    Comment: Performed at [facility] [address], [place], [place] [address]  SARS CORONAVIRUS 2 (TAT 6-24 HRS) Nasopharyngeal Nasopharyngeal Swab     Status: None   Collection Time: 05/21/19 11:34 AM   Specimen: Nasopharyngeal Swab  Result Value Ref Range Status   SARS Coronavirus 2 NEGATIVE NEGATIVE Final  Comment: (NOTE) SARS-CoV-2 target nucleic acids are NOT DETECTED. The SARS-CoV-2 RNA is generally detectable in upper and lower respiratory specimens during the acute phase of infection. Negative results do not preclude SARS-CoV-2 infection, do not rule out co-infections with other pathogens, and should not be used as the sole basis for treatment or other patient management decisions. Negative results must be combined with clinical observations, patient history, and epidemiological information. The expected result is Negative. Fact Sheet for Patients: SugarRoll.be Fact Sheet for Healthcare Providers: https://www.woods-mathews.com/ This test is not yet approved or cleared by the [place] FDA and  has been authorized for detection and/or diagnosis of SARS-CoV-2 by FDA under an Emergency Use Authorization (EUA). This EUA will remain  in effect (meaning this test can be used) for the duration of the COVID-19 declaration under Section  56 4(b)(1) of the Act, 21 U.S.C. section 360bbb-3(b)(1), unless the authorization is terminated or revoked sooner. Performed at [facility], [place] [address], [place], [place] [address]   Blood culture (routine x 2)     Status: None (Preliminary result)   Collection Time: 05/21/19 12:34 PM   Specimen: BLOOD  Result Value Ref Range Status   Specimen Description   Final    BLOOD LEFT ARM Performed at [facility] [address], [place], [place] [address]    Special Requests   Final    BOTTLES DRAWN AEROBIC AND ANAEROBIC Blood Culture adequate volume Performed at [facility] [address] [place], [place] [address]    Culture   Final    NO GROWTH 3 DAYS Performed at [facility], [place] [address], [place], [place] [address]    Report Status PENDING  Incomplete  Blood culture (routine x 2)     Status: None (Preliminary result)   Collection Time: 05/21/19 12:34 PM   Specimen: BLOOD  Result Value Ref Range Status   Specimen Description   Final    BLOOD LEFT HAND Performed at [facility] [address], [place], [place] [address]    Special Requests   Final    BOTTLES DRAWN AEROBIC AND ANAEROBIC Blood Culture adequate volume Performed at [facility] [address] [place], [place] [address]    Culture   Final    NO GROWTH 3 DAYS Performed at [facility], [place] [address], [place], [place] [address]    Report Status PENDING  Incomplete     Labs: BNP (last 3 results) Recent Labs    10/15/18 2008 11/07/18 0432  BNP 38.1 96.2   Basic Metabolic Panel: Recent Labs  Lab 05/21/19 0711 05/22/19 0540 05/23/19 0622 05/24/19 0600  NA 139 141 137 140  K 4.5 4.4 4.2 4.1  CL 100 105 103 104  CO2 26 25 25 26   GLUCOSE 98 153* 145* 133*  BUN 17 19 26* 25*  CREATININE 0.72 0.63 0.80 0.69  CALCIUM 9.4 9.3 9.3 9.4   Liver Function Tests: Recent Labs  Lab 05/22/19 0540  AST  13*  ALT 18  ALKPHOS 81  BILITOT 0.5  PROT 6.5  ALBUMIN 3.6   No results for input(s): LIPASE, AMYLASE in the last 168 hours. No results for input(s): AMMONIA in the last 168 hours. CBC: Recent Labs  Lab 05/21/19 0711 05/22/19 0540 05/23/19 0622 05/24/19 0600  WBC 18.0* 13.3* 14.8* 14.9*  NEUTROABS 14.6*  --   --   --   HGB 11.6* 10.1* 10.4* 11.1*  HCT 37.7 33.5* 34.4* 36.7  MCV 84.5 84.2 84.1 85.2  PLT 359 333 348 386   Cardiac Enzymes: No results for input(s): CKTOTAL, CKMB, CKMBINDEX, TROPONINI in the last 168 hours. BNP: Invalid input(s): POCBNP CBG: Recent Labs  Lab 05/23/19 1648 05/23/19 2014 05/24/19 0743 05/24/19 1153  GLUCAP 141* 139* 151* 103*   D-Dimer No results for input(s): DDIMER in the last 72 hours. Hgb A1c Recent Labs    05/23/19 0622  HGBA1C 6.3*   Lipid Profile No results for input(s): CHOL, HDL, LDLCALC, TRIG, CHOLHDL, LDLDIRECT in the last 72 hours. Thyroid function studies No results for input(s): TSH, T4TOTAL, T3FREE, THYROIDAB in the last 72 hours.  Invalid input(s): FREET3 Anemia work up No results for input(s): VITAMINB12, FOLATE, FERRITIN, TIBC, IRON, RETICCTPCT in the last 72 hours. Urinalysis    Component Value Date/Time   COLORURINE YELLOW 10/15/2018 1948   APPEARANCEUR HAZY (A) 10/15/2018 1948   LABSPEC 1.028 10/15/2018 1948   PHURINE 5.0 10/15/2018 1948   GLUCOSEU NEGATIVE 10/15/2018 1948   HGBUR NEGATIVE 10/15/2018 [place] NEGATIVE 10/15/2018 1948   KETONESUR 5 (A) 10/15/2018 1948   PROTEINUR NEGATIVE 10/15/2018 1948   NITRITE NEGATIVE 10/15/2018 1948   LEUKOCYTESUR MODERATE (A) 10/15/2018 1948   Sepsis Labs Invalid input(s): PROCALCITONIN,  WBC,  LACTICIDVEN Microbiology Recent Results (from the past 240 hour(s))  Novel Coronavirus, NAA (Hosp order, Send-out to Ref Lab; TAT 18-24 hrs     Status: None   Collection Time: 05/15/19 10:14 AM   Specimen: Nasopharyngeal Swab; Respiratory  Result Value Ref  Range Status   SARS-CoV-2, NAA NOT DETECTED NOT DETECTED Final    Comment: (NOTE) This nucleic acid amplification test was developed and its performance characteristics determined by [employer]. Nucleic acid amplification tests include PCR and TMA. This test has not been FDA cleared or approved. This test has been authorized by FDA under an Emergency Use Authorization (EUA). This test is only authorized for the duration of time the declaration that circumstances exist justifying the authorization of the emergency use of in vitro diagnostic tests for detection of SARS-CoV-2 virus and/or diagnosis of COVID-19 infection under section 564(b)(1) of the Act, 21 U.S.C. 465KPT-4(S) (1), unless the authorization is terminated or revoked sooner. When diagnostic testing is negative, the possibility of a false negative result should be considered in the context of a patient's recent exposures and the presence of clinical signs and symptoms consistent with COVID-19. An individual without symptoms of COVID- 19 and who is not shedding SARS-CoV-2 vi rus would expect to have a negative (not detected) result in this assay. Performed At: [facility] [place], [place] [id number] Rush Farmer MD GY:[id number]    [place]  Final    Comment: Performed at [facility] [address] [place], [place] [address]  SARS CORONAVIRUS 2 (TAT 6-24 HRS) Nasopharyngeal Nasopharyngeal Swab     Status: None   Collection Time: 05/21/19 11:34 AM   Specimen: Nasopharyngeal Swab  Result Value Ref Range Status   SARS Coronavirus 2 NEGATIVE NEGATIVE Final    Comment: (NOTE) SARS-CoV-2 target nucleic acids are NOT DETECTED. The SARS-CoV-2 RNA is generally detectable in upper and lower respiratory specimens during the acute phase of infection. Negative results do not preclude SARS-CoV-2 infection, do not rule out co-infections with other  pathogens, and should not be used as the sole basis for treatment or other patient management decisions. Negative results must be combined with clinical observations,  patient history, and epidemiological information. The expected result is Negative. Fact Sheet for Patients: SugarRoll.be Fact Sheet for Healthcare Providers: https://www.woods-mathews.com/ This test is not yet approved or cleared by the [place] FDA and  has been authorized for detection and/or diagnosis of SARS-CoV-2 by FDA under an Emergency Use Authorization (EUA). This EUA will remain  in effect (meaning this test can be used) for the duration of the COVID-19 declaration under Section 56 4(b)(1) of the Act, 21 U.S.C. section 360bbb-3(b)(1), unless the authorization is terminated or revoked sooner. Performed at [facility], [place] [address] [place], [place] [address]   Blood culture (routine x 2)     Status: None (Preliminary result)   Collection Time: 05/21/19 12:34 PM   Specimen: BLOOD  Result Value Ref Range Status   Specimen Description   Final    BLOOD LEFT ARM Performed at [facility] [address], [place], [place] [address]    Special Requests   Final    BOTTLES DRAWN AEROBIC AND ANAEROBIC Blood Culture adequate volume Performed at [facility] [address] [place], [place] [address]    Culture   Final    NO GROWTH 3 DAYS Performed at [facility], [place] [address] [place], [place] [address]    Report Status PENDING  Incomplete  Blood culture (routine x 2)     Status: None (Preliminary result)   Collection Time: 05/21/19 12:34 PM   Specimen: BLOOD  Result Value Ref Range Status   Specimen Description   Final    BLOOD LEFT HAND Performed at [facility] [address] [place], [place] [address]    Special Requests   Final    BOTTLES DRAWN AEROBIC AND ANAEROBIC Blood Culture  adequate volume Performed at [facility] [address], [place], [place] [address]    Culture   Final    NO GROWTH 3 DAYS Performed at [facility], [place] [address], [place], [place] [address]    Report Status PENDING  Incomplete    Discharge Instructions     Discharge Instructions    Ambulatory referral to Pulmonology   Complete by: As directed    Diet - low sodium heart healthy   Complete by: As directed    Discharge instructions   Complete by: As directed    You were seen and examined in the hospital for shortness of breath found to have COPD exacerbation and cared for by a hospitalist.   Upon Discharge:  -Take prednisone 50 mg once daily starting tomorrow for the next 2 days -Take doxycycline 100 mg twice daily for the next 4 days, first dose tonight as you had a dose this morning in the hospital. -Continue taking your nebulizer treatments every 2-4 hours as prescribed -Follow-up with your primary care physician within the next 1 to 2 weeks -You are being referred to a pulmonologist to establish care Make an appointment with your primary care physician within 7 days Bring all home medications to your appointment to review Request that your primary physician go over all hospital tests and procedures/radiological results at the follow up.   Please get all hospital records sent to your physician by signing a hospital release before you go home.     Read the complete instructions along with all the possible side effects for all the medicines you take and that have been prescribed to you. Take any new medicines after you have  completely understood and accept all the possible adverse reactions/side effects.   If you have any questions about your discharge medications or the care you received while you were in the hospital, you can call the unit and asked to speak with the hospitalist on call. Once you are discharged, your primary care physician will  handle any further medical issues. Please note that NO REFILLS for any discharge medications will be authorized, as it is imperative that you return to your primary care physician (or establish a relationship with a primary care physician if you do not have one) for your aftercare needs so that they can reassess your need for medications and monitor your lab values.   Do not drive, operate heavy machinery, perform activities at heights, swimming or participation in water activities or provide baby sitting services if your were admitted for loss of consciousness/seizures or if you are on sedating medications including, but not limited to benzodiazepines, sleep medications, narcotic pain medications, etc., until you have been cleared to do so by a medical doctor.   Do not take more than prescribed medications.   Wear a seat belt while driving.  If you have smoked or chewed Tobacco in the last 2 years please stop smoking; also stop any regular Alcohol and/or any Recreational drug use including marijuana.  If you experience worsening of your admission symptoms or develop shortness of breath, chest pain, suicidal or homicidal thoughts or experience a life threatening emergency, you must seek medical attention immediately by calling 911 or calling your PCP immediately.   Increase activity slowly   Complete by: As directed      Allergies as of 05/24/2019      Reactions   Banana Hives, Itching      Medication List    STOP taking these medications   FASENRA [place]   oxyCODONE-acetaminophen 5-325 MG tablet Commonly known as: PERCOCET/ROXICET     TAKE these medications   albuterol 108 (90 Base) MCG/ACT inhaler Commonly known as: VENTOLIN HFA Inhale 1-2 puffs into the lungs every 6 (six) hours as needed for wheezing or shortness of breath.   budesonide 0.5 MG/2ML nebulizer solution Commonly known as: Pulmicort Take 2 mLs (0.5 mg total) by nebulization 2 (two) times daily.   doxycycline 100 MG  tablet Commonly known as: VIBRA-TABS Take 1 tablet (100 mg total) by mouth every 12 (twelve) hours for 4 days.   fluticasone 50 MCG/ACT nasal spray Commonly known as: FLONASE Place 2 sprays into both nostrils daily as needed for allergies.   ipratropium 0.02 % nebulizer solution Commonly known as: ATROVENT Take 1.25 mLs (0.25 mg total) by nebulization every 4 (four) hours as needed. What changed: reasons to take this   ipratropium-albuterol 0.5-2.5 (3) MG/3ML Soln Commonly known as: DUONEB INHALE 1 VIAL BY NEBULIZER EVERY 6 HOURS AS NEEDED FOR SHORTNESS OF BREATH AND WHEEZING   levocetirizine 5 MG tablet Commonly known as: XYZAL Take 1 tablet (5 mg total) by mouth every evening.   losartan-hydrochlorothiazide 100-25 MG tablet Commonly known as: HYZAAR Take 1 tablet by mouth daily.   multivitamin with minerals Tabs tablet Take 1 tablet by mouth daily. Centrum Multivitamin   pantoprazole 40 MG tablet Commonly known as: Protonix Take 1 tablet (40 mg total) by mouth daily.   Perforomist 20 MCG/2ML nebulizer solution Generic drug: formoterol Take 2 mLs (20 mcg total) by nebulization 2 (two) times daily.   predniSONE 50 MG tablet Commonly known as: DELTASONE Take 1 tablet (50 mg total) by  mouth daily with breakfast for 2 days. Start taking on: May 25, 2019   VITAMIN D3 PO Take 1 tablet by mouth 5 days.   Yupelri 175 MCG/3ML Soln Generic drug: Revefenacin Inhale 3 mLs into the lungs daily.       Allergies  Allergen Reactions  . Banana Hives and Itching    Time coordinating discharge: Over 30 minutes   SIGNED:   Harold Hedge, D.O. [facility] Pager: [phone]  05/24/2019, 1:39 PM

## 2019-05-26 LAB — CULTURE, BLOOD (ROUTINE X 2)
Culture: NO GROWTH
Culture: NO GROWTH
Special Requests: ADEQUATE
Special Requests: ADEQUATE

## 2019-05-29 ENCOUNTER — Telehealth: Payer: Self-pay | Admitting: Pulmonary Disease

## 2019-05-29 NOTE — Telephone Encounter (Signed)
Sched for 12/16 @ 2:30.  Nothing further needed at this time.

## 2019-05-30 ENCOUNTER — Other Ambulatory Visit: Payer: Self-pay

## 2019-05-30 MED ORDER — ALBUTEROL SULFATE HFA 108 (90 BASE) MCG/ACT IN AERS: 1.0000 | INHALATION_SPRAY | RESPIRATORY_TRACT | 0 refills | Status: DC | PRN

## 2019-05-31 ENCOUNTER — Other Ambulatory Visit: Payer: Self-pay

## 2019-05-31 ENCOUNTER — Ambulatory Visit: Payer: Medicare Other

## 2019-05-31 DIAGNOSIS — G4733 Obstructive sleep apnea (adult) (pediatric): Secondary | ICD-10-CM

## 2019-06-01 ENCOUNTER — Telehealth: Payer: Self-pay | Admitting: Pulmonary Disease

## 2019-06-01 DIAGNOSIS — G4733 Obstructive sleep apnea (adult) (pediatric): Secondary | ICD-10-CM

## 2019-06-01 NOTE — Telephone Encounter (Signed)
Called and spoke with Patient.  Dr. Judson Roch results and recommendations given.  Patient is scheduled tele visit with Tammy, NP 06/02/19.  Will leave copy of sleep study with TP for OV.  DME order placed.  Nothing further at this time.  Dr. Ander Slade has reviewed the home sleep test this showed severe obstructive sleep apnea.   Recommendations   Treatment options are CPAP with the settings auto 5 to 18.    Weight loss measures .   Advise against driving while sleepy & against medication with sedative side effects.    Make appointment for 3 months for compliance with download with Dr. Ander Slade.

## 2019-06-02 ENCOUNTER — Other Ambulatory Visit: Payer: Self-pay

## 2019-06-02 ENCOUNTER — Inpatient Hospital Stay: Payer: Medicare Other | Admitting: Adult Health

## 2019-06-02 ENCOUNTER — Telehealth: Payer: Self-pay | Admitting: Adult Health

## 2019-06-02 NOTE — Telephone Encounter (Signed)
Patient scheduled for 1100 telephone visit with Tammy NP Patient was called 3 times with no answer and a message was left twice.  Patient appt has been changed to a [place].

## 2019-06-03 ENCOUNTER — Other Ambulatory Visit: Payer: Self-pay

## 2019-06-03 ENCOUNTER — Emergency Department (HOSPITAL_COMMUNITY)
Admission: EM | Admit: 2019-06-03 | Discharge: 2019-06-04 | Disposition: A | Discharge status: Home / Self Care | Payer: Medicare Other | Attending: Emergency Medicine | Admitting: Emergency Medicine

## 2019-06-03 ENCOUNTER — Encounter (HOSPITAL_COMMUNITY): Payer: Self-pay

## 2019-06-03 ENCOUNTER — Emergency Department (HOSPITAL_COMMUNITY): Payer: Medicare Other

## 2019-06-03 DIAGNOSIS — Z87891 Personal history of nicotine dependence: Secondary | ICD-10-CM | POA: Diagnosis not present

## 2019-06-03 DIAGNOSIS — Z20828 Contact with and (suspected) exposure to other viral communicable diseases: Secondary | ICD-10-CM | POA: Insufficient documentation

## 2019-06-03 DIAGNOSIS — Z79899 Other long term (current) drug therapy: Secondary | ICD-10-CM | POA: Insufficient documentation

## 2019-06-03 DIAGNOSIS — I10 Essential (primary) hypertension: Secondary | ICD-10-CM | POA: Insufficient documentation

## 2019-06-03 DIAGNOSIS — J45909 Unspecified asthma, uncomplicated: Secondary | ICD-10-CM | POA: Diagnosis not present

## 2019-06-03 DIAGNOSIS — J441 Chronic obstructive pulmonary disease with (acute) exacerbation: Secondary | ICD-10-CM | POA: Diagnosis not present

## 2019-06-03 DIAGNOSIS — R0602 Shortness of breath: Secondary | ICD-10-CM | POA: Diagnosis present

## 2019-06-03 LAB — CBC WITH DIFFERENTIAL/PLATELET
Abs Immature Granulocytes: 0.03 10*3/uL (ref 0.00–0.07)
Basophils Absolute: 0 10*3/uL (ref 0.0–0.1)
Basophils Relative: 0 %
Eosinophils Absolute: 0 10*3/uL (ref 0.0–0.5)
Eosinophils Relative: 0 %
HCT: 34.9 % — ABNORMAL LOW (ref 36.0–46.0)
Hemoglobin: 10.5 g/dL — ABNORMAL LOW (ref 12.0–15.0)
Immature Granulocytes: 0 %
Lymphocytes Relative: 31 %
Lymphs Abs: 2.5 10*3/uL (ref 0.7–4.0)
MCH: 25.7 pg — ABNORMAL LOW (ref 26.0–34.0)
MCHC: 30.1 g/dL (ref 30.0–36.0)
MCV: 85.5 fL (ref 80.0–100.0)
Monocytes Absolute: 0.4 10*3/uL (ref 0.1–1.0)
Monocytes Relative: 5 %
Neutro Abs: 5.1 10*3/uL (ref 1.7–7.7)
Neutrophils Relative %: 64 %
Platelets: 258 10*3/uL (ref 150–400)
RBC: 4.08 MIL/uL (ref 3.87–5.11)
RDW: 16.8 % — ABNORMAL HIGH (ref 11.5–15.5)
WBC: 8 10*3/uL (ref 4.0–10.5)
nRBC: 0 % (ref 0.0–0.2)

## 2019-06-03 LAB — BASIC METABOLIC PANEL
Anion gap: 7 (ref 5–15)
BUN: 15 mg/dL (ref 8–23)
CO2: 26 mmol/L (ref 22–32)
Calcium: 8.9 mg/dL (ref 8.9–10.3)
Chloride: 108 mmol/L (ref 98–111)
Creatinine, Ser: 0.62 mg/dL (ref 0.44–1.00)
GFR calc Af Amer: >60 mL/min (ref >60)
GFR calc non Af Amer: >60 mL/min (ref >60)
Glucose, Bld: 106 mg/dL — ABNORMAL HIGH (ref 70–99)
Potassium: 4.1 mmol/L (ref 3.5–5.1)
Sodium: 141 mmol/L (ref 135–145)

## 2019-06-03 LAB — MAGNESIUM: Magnesium: 1.9 mg/dL (ref 1.7–2.4)

## 2019-06-03 LAB — POC SARS CORONAVIRUS 2 AG -  ED: SARS Coronavirus 2 Ag: NEGATIVE

## 2019-06-03 MED ORDER — METHYLPREDNISOLONE SODIUM SUCC 125 MG IJ SOLR
125.0000 mg | Freq: Once | INTRAMUSCULAR | Status: AC
  Administered 2019-06-03: 125 mg via INTRAVENOUS
  Filled 2019-06-03: qty 2

## 2019-06-03 MED ORDER — MAGNESIUM SULFATE 2 GM/50ML IV SOLN
2.0000 g | Freq: Once | INTRAVENOUS | Status: AC
  Administered 2019-06-03: 2 g via INTRAVENOUS
  Filled 2019-06-03: qty 50

## 2019-06-03 MED ORDER — IPRATROPIUM BROMIDE HFA 17 MCG/ACT IN AERS
2.0000 | INHALATION_SPRAY | Freq: Once | RESPIRATORY_TRACT | Status: AC
  Administered 2019-06-03: 2 via RESPIRATORY_TRACT
  Filled 2019-06-03: qty 12.9

## 2019-06-03 MED ORDER — AEROCHAMBER Z-STAT PLUS/MEDIUM MISC
1.0000 | Freq: Once | Status: AC
  Administered 2019-06-03: 1
  Filled 2019-06-03: qty 1

## 2019-06-03 MED ORDER — ALBUTEROL SULFATE HFA 108 (90 BASE) MCG/ACT IN AERS
8.0000 | INHALATION_SPRAY | Freq: Once | RESPIRATORY_TRACT | Status: AC
  Administered 2019-06-03: 8 via RESPIRATORY_TRACT
  Filled 2019-06-03: qty 6.7

## 2019-06-03 NOTE — ED Triage Notes (Signed)
Pt reports SHOB x2 days. Pt reports she was admitted about a week ago for same. Pt has hx of COPD. Pt wheezing and coughing in triage.

## 2019-06-04 DIAGNOSIS — J441 Chronic obstructive pulmonary disease with (acute) exacerbation: Secondary | ICD-10-CM | POA: Diagnosis not present

## 2019-06-04 MED ORDER — PREDNISONE 10 MG (21) PO TBPK: ORAL_TABLET | Freq: Every day | ORAL | 0 refills | Status: DC

## 2019-06-04 MED ORDER — IPRATROPIUM-ALBUTEROL 0.5-2.5 (3) MG/3ML IN SOLN
3.0000 mL | Freq: Once | RESPIRATORY_TRACT | Status: AC
  Administered 2019-06-04: 3 mL via RESPIRATORY_TRACT
  Filled 2019-06-04: qty 3

## 2019-06-04 NOTE — ED Notes (Signed)
Pt walked successfully no destat noted

## 2019-06-04 NOTE — ED Provider Notes (Signed)
[facility] DEPT Provider Note   CSN: [id number] Arrival date & time: 06/03/19  2149     History No chief complaint on file.   Patty Howard is a 63 y.o. female with a history of COPD, OSA, and asthma who presents to the emergency department with a chief complaint of shortness of breath.  The patient was recently hospitalized for COPD exacerbation from 12/6-12/9.  She was discharged with a 5-day "burst" of prednisone, which she states that she finished 3 days ago and doxycycline.  She reports that she was feeling much better after she was discharged from the hospital until she finished her course of prednisone.  She reports that shortness of breath has been constant and worsening over the last 2 days.  She reports that she has been short of breath going from room to room today despite using her home albuterol inhaler and nebulizers every 4-6 hours.  States that her symptoms feel like her usual COPD exacerbations.  She also endorses a productive cough with clear and yellow sputum and wheezing.  She denies chest pain, fever, chills, leg swelling, back pain, dizziness, lightheadedness, abdominal pain, N/V/D, or URI symptoms.  Denies any known or suspected COVID-19 exposures.  The history is provided by the patient. No language interpreter was used.       Past Medical History:  Diagnosis Date  . Anxiety   . Arthritis   . Asthma   . Asthma   . COPD (chronic obstructive pulmonary disease) ([place])   . Fracture    closed displaced left radial head  . Hypertension   . Sleep apnea    does not wear CPAP    Patient Active Problem List   Diagnosis Date Noted  . Acute exacerbation of chronic obstructive pulmonary disease (COPD) ([place]) 05/22/2019  . Left radial head fracture 02/03/2019  . Closed fracture of radial head 01/29/2019  . Prediabetes 10/19/2018  . Hypertension, essential, benign 10/19/2018  . Asthma with COPD (chronic obstructive pulmonary  disease) ([place]) 10/19/2018  . Insomnia 10/19/2018  . Elbow pain, chronic, left 10/19/2018  . Asthma 05/29/2018  . COPD exacerbation ([place]) 04/05/2018  . H/O Spinal surgery 01/18/2015  . HTN (hypertension) 01/18/2015  . Obesity 01/18/2015    Past Surgical History:  Procedure Laterality Date  . BACK SURGERY     spinal stimulator  . COLONOSCOPY W/ BIOPSIES AND POLYPECTOMY    . DILATION AND CURETTAGE OF UTERUS    . LAPAROSCOPIC ROUX-EN-Y GASTRIC BYPASS WITH UPPER ENDOSCOPY AND REMOVAL OF LAP BAND    . RADIAL HEAD ARTHROPLASTY Left 02/03/2019   Procedure: LEFT RADIAL HEAD ARTHROPLASTY;  Surgeon: [name], MD;  Location: [facility];  Service: Orthopedics;  Laterality: Left;  AXILLARY BLOCK VS BIER BLOCK  . TUBAL LIGATION       OB History   No obstetric history on file.     Family History  Problem Relation Age of Onset  . Hypertension Mother   . Stroke Mother   . Cancer Father     Social History   Tobacco Use  . Smoking status: Former Smoker    Types: Cigarettes    Quit date: 06/15/1998    Years since quitting: 20.9  . Smokeless tobacco: Never Used  Substance Use Topics  . Alcohol use: Never  . Drug use: Never    Home Medications Prior to Admission medications   Medication Sig Start Date End Date Taking? Authorizing Provider  acetaminophen (TYLENOL) 500 MG tablet Take 1,000  mg by mouth every 6 (six) hours as needed for moderate pain.   Yes [provider]  albuterol (VENTOLIN HFA) 108 (90 Base) MCG/ACT inhaler Inhale 1-2 puffs into the lungs every 4 (four) hours as needed. Patient taking differently: Inhale 1-2 puffs into the lungs every 4 (four) hours as needed for wheezing.  05/30/19  Yes Valentina Shaggy, MD  budesonide (PULMICORT) 0.5 MG/2ML nebulizer solution Take 2 mLs (0.5 mg total) by nebulization 2 (two) times daily. 05/24/19  Yes Harold Hedge, MD  Cholecalciferol (VITAMIN D3 PO) Take 1 tablet by mouth daily.    Yes [provider]   fluticasone (FLONASE) 50 MCG/ACT nasal spray Place 2 sprays into both nostrils daily as needed for allergies. 04/27/19  Yes Valentina Shaggy, MD  formoterol (PERFOROMIST) 20 MCG/2ML nebulizer solution Take 2 mLs (20 mcg total) by nebulization 2 (two) times daily. 05/24/19  Yes Harold Hedge, MD  ipratropium (ATROVENT) 0.02 % nebulizer solution Take 1.25 mLs (0.25 mg total) by nebulization every 4 (four) hours as needed. Patient taking differently: Take 0.25 mg by nebulization every 4 (four) hours as needed for wheezing or shortness of breath.  12/20/18  Yes Valentina Shaggy, MD  levocetirizine (XYZAL) 5 MG tablet Take 1 tablet (5 mg total) by mouth every evening. 02/07/19  Yes Valentina Shaggy, MD  losartan-hydrochlorothiazide (HYZAAR) 100-25 MG tablet Take 1 tablet by mouth daily.   Yes [provider]  Multiple Vitamin (MULTIVITAMIN WITH MINERALS) TABS tablet Take 1 tablet by mouth daily. [facility] Multivitamin   Yes [provider]  Revefenacin (YUPELRI) 175 MCG/3ML SOLN Inhale 3 mLs into the lungs daily. 04/27/19  Yes Valentina Shaggy, MD  predniSONE (STERAPRED UNI-PAK 21 TAB) 10 MG (21) TBPK tablet Take by mouth daily. Take 6 tabs by mouth daily  for 2 days, then 5 tabs for 2 days, then 4 tabs for 2 days, then 3 tabs for 2 days, 2 tabs for 2 days, then 1 tab by mouth daily for 2 days 06/04/19   Matricia Begnaud A, PA-C  ipratropium-albuterol (DUONEB) 0.5-2.5 (3) MG/3ML SOLN INHALE 1 VIAL BY NEBULIZER EVERY 6 HOURS AS NEEDED FOR SHORTNESS OF BREATH AND WHEEZING Patient not taking: Reported on 05/21/2019 04/24/19 06/04/19  Valentina Shaggy, MD  pantoprazole (PROTONIX) 40 MG tablet Take 1 tablet (40 mg total) by mouth daily. Patient not taking: Reported on 05/21/2019 01/13/19 06/04/19  Valentina Shaggy, MD    Allergies    Banana  Review of Systems   Review of Systems  Constitutional: Negative for activity change, chills and fever.  HENT: Negative for  congestion and sore throat.   Respiratory: Positive for cough, shortness of breath and wheezing.   Cardiovascular: Negative for chest pain, palpitations and leg swelling.  Gastrointestinal: Negative for abdominal pain, diarrhea, nausea and vomiting.  Genitourinary: Negative for dysuria.  Musculoskeletal: Negative for back pain and myalgias.  Skin: Negative for rash.  Allergic/Immunologic: Negative for immunocompromised state.  Neurological: Negative for dizziness, seizures, syncope, weakness and headaches.  Psychiatric/Behavioral: Negative for agitation and confusion.    Physical Exam Updated Vital Signs BP 140/80 (BP Location: Right Arm)   Pulse 96   Temp 98.6 F (37 C) (Oral)   Resp 15   Ht 5' 10"  (1.778 m)   Wt 129.3 kg   SpO2 96%   BMI 40.90 kg/m   Physical Exam Vitals and nursing note reviewed.  Constitutional:      General: She is not in  acute distress.    Appearance: She is obese. She is not ill-appearing, toxic-appearing or diaphoretic.  HENT:     Head: Normocephalic.  Eyes:     Conjunctiva/sclera: Conjunctivae normal.  Cardiovascular:     Rate and Rhythm: Regular rhythm. Tachycardia present.     Pulses: Normal pulses.     Heart sounds: Normal heart sounds. No murmur. No friction rub. No gallop.   Pulmonary:     Effort: Pulmonary effort is normal. No respiratory distress.     Breath sounds: No stridor. Wheezing and rhonchi present. No rales.     Comments: Expiratory wheezes in all fields.  She has a rhonchorous sounding cough.  No sounds are slightly diminished in all fields.  There are no retractions, accessory muscle use, or nasal flaring.  No respiratory distress. Abdominal:     General: There is no distension.     Palpations: Abdomen is soft. There is no mass.     Tenderness: There is no abdominal tenderness. There is no right CVA tenderness, left CVA tenderness, guarding or rebound.     Hernia: No hernia is present.  Musculoskeletal:     Cervical back:  Neck supple.     Right lower leg: No edema.     Left lower leg: No edema.  Skin:    General: Skin is warm.     Findings: No rash.  Neurological:     Mental Status: She is alert.  Psychiatric:        Behavior: Behavior normal.    ED Results / Procedures / Treatments   Labs (all labs ordered are listed, but only abnormal results are displayed) Labs Reviewed  CBC WITH DIFFERENTIAL/PLATELET - Abnormal; Notable for the following components:      Result Value   Hemoglobin 10.5 (*)    HCT 34.9 (*)    MCH 25.7 (*)    RDW 16.8 (*)    All other components within normal limits  BASIC METABOLIC PANEL - Abnormal; Notable for the following components:   Glucose, Bld 106 (*)    All other components within normal limits  MAGNESIUM  POC SARS CORONAVIRUS 2 AG -  ED    EKG EKG Interpretation  Date/Time:  Saturday June 03 2019 21:59:55 EST Ventricular Rate:  100 PR Interval:    QRS Duration: 87 QT Interval:  351 QTC Calculation: 453 R Axis:   71 Text Interpretation: Sinus tachycardia Probable left atrial enlargement Baseline wander in lead(s) II III aVR aVF V4 No significant change since last tracing Confirmed by Blanchie Dessert ([id number]) on 06/03/2019 10:44:14 PM   Radiology DG Chest Port 1 View  Result Date: 06/03/2019 CLINICAL DATA:  Shortness of breath EXAM: PORTABLE CHEST 1 VIEW COMPARISON:  May 21, 2019 FINDINGS: The heart size and mediastinal contours are within normal limits. There is overall shallow degree of aeration. No large airspace consolidation or pleural effusion. No acute osseous abnormality. IMPRESSION: No active disease. Electronically Signed   By: Prudencio Pair M.D.   On: 06/03/2019 22:53    Procedures Procedures (including critical care time)  Medications Ordered in ED Medications  albuterol (VENTOLIN HFA) 108 (90 Base) MCG/ACT inhaler 8 puff (8 puffs Inhalation Given 06/03/19 2238)  aerochamber Z-Stat Plus/medium 1 each (1 each Other Given 06/03/19  2238)  methylPREDNISolone sodium succinate (SOLU-MEDROL) 125 mg/2 mL injection 125 mg (125 mg Intravenous Given 06/03/19 2246)  ipratropium (ATROVENT HFA) inhaler 2 puff (2 puffs Inhalation Given 06/03/19 2302)  magnesium sulfate IVPB 2 g 50  mL (0 g Intravenous Stopped 06/04/19 0037)  ipratropium-albuterol (DUONEB) 0.5-2.5 (3) MG/3ML nebulizer solution 3 mL (3 mLs Nebulization Given 06/04/19 0054)    ED Course  I have reviewed the triage vital signs and the nursing notes.  Pertinent labs & imaging results that were available during my care of the patient were reviewed by me and considered in my medical decision making (see chart for details).    MDM Rules/Calculators/A&P                      63 year old female with a history of COPD, OSA, and asthma presenting with worsening shortness of breath, wheezing, and cough over the last 2 days that worsened today.  The patient was independently seen and evaluated with Dr. Maryan Rued, attending physician, who was in agreement with the work-up and plan.  On exam, she has wheezes and rhonchi throughout.  She was given an albuterol and atrovent inhalers with a spacer, magnesium sulfate, and Solu-Medrol.  After her Covid test was negative, she was given a nebulizer treatment.  On re-evaluation, she reports that she was feeling much improved.  Labs are reassuring.  Chest x-ray is unremarkable.  I do not think that she needs another course of antibiotics as she is not having any infectious symptoms.  Doubt ACS as she is having no chest pain.  Low suspicion for COVID-19 as COPD exacerbation is much more likely given her presenting symptoms today.  No evidence of bacterial pneumonia. Doubt PE.   After nebulizer, the patient was ambulated on pulse ox throughout the department.  She was observed by me ambulating at a very fast walking pace and maintained an oxygen saturation greater than 94%.  We had a shared decision-making conversation regarding her symptoms, and  the patient is agreeable to being discharged with a longer prednisone taper and close follow-up with primary care, which I think is reasonable given how well she appears.  ER return precautions given.  She is hemodynamically stable in no acute distress.  Safe for discharge to home with outpatient follow-up at this time.  Final Clinical Impression(s) / ED Diagnoses Final diagnoses:  COPD with acute exacerbation ([place])    Rx / DC Orders ED Discharge Orders         Ordered    predniSONE (STERAPRED UNI-PAK 21 TAB) 10 MG (21) TBPK tablet  Daily     06/04/19 0158           Laisha Rau, Maree Erie A, PA-C 06/04/19 3794    Blanchie Dessert, MD 06/04/19 1651

## 2019-06-04 NOTE — Discharge Instructions (Signed)
Thank you for allowing me to care for you today in the Emergency Department.   Starting tomorrow, take prednisone as prescribed.  Continue to use your albuterol inhaler or nebulizer treatments every 4 hours as needed for shortness of breath.  Please call and schedule follow-up appoint with your primary care provider for reevaluation on Monday or Tuesday.  Return to the emergency department if you develop respiratory distress, if you pass out, if your fingers or lips turn blue, if you develop shortness of breath with severe chest pain, sweating, uncontrollable vomiting, or other new, concerning symptoms.

## 2019-06-07 ENCOUNTER — Ambulatory Visit: Payer: Self-pay

## 2019-06-23 ENCOUNTER — Ambulatory Visit (INDEPENDENT_AMBULATORY_CARE_PROVIDER_SITE_OTHER): Payer: Medicare Other

## 2019-06-23 ENCOUNTER — Other Ambulatory Visit: Payer: Self-pay

## 2019-06-23 DIAGNOSIS — J4550 Severe persistent asthma, uncomplicated: Secondary | ICD-10-CM | POA: Diagnosis not present

## 2019-06-27 ENCOUNTER — Ambulatory Visit: Payer: Medicare Other | Admitting: Allergy & Immunology

## 2019-07-05 ENCOUNTER — Ambulatory Visit: Payer: Medicare Other | Admitting: Pulmonary Disease

## 2019-08-04 ENCOUNTER — Other Ambulatory Visit: Payer: Self-pay

## 2019-08-04 MED ORDER — IPRATROPIUM-ALBUTEROL 0.5-2.5 (3) MG/3ML IN SOLN: 3.0000 mL | Freq: Four times a day (QID) | RESPIRATORY_TRACT | 1 refills | Status: DC | PRN

## 2019-08-18 ENCOUNTER — Ambulatory Visit: Payer: Self-pay

## 2019-08-21 ENCOUNTER — Ambulatory Visit (INDEPENDENT_AMBULATORY_CARE_PROVIDER_SITE_OTHER): Payer: Medicare Other | Admitting: Pulmonary Disease

## 2019-08-21 ENCOUNTER — Encounter: Payer: Self-pay | Admitting: Pulmonary Disease

## 2019-08-21 ENCOUNTER — Other Ambulatory Visit: Payer: Self-pay

## 2019-08-21 VITALS — BP 138/68 | HR 98 | Temp 97.0°F | Ht 70.0 in | Wt 308.6 lb

## 2019-08-21 DIAGNOSIS — G4733 Obstructive sleep apnea (adult) (pediatric): Secondary | ICD-10-CM

## 2019-08-21 MED ORDER — PREDNISONE 10 MG PO TABS: 10.0000 mg | ORAL_TABLET | Freq: Two times a day (BID) | ORAL | 0 refills | Status: DC

## 2019-08-21 MED ORDER — LEVOFLOXACIN 750 MG PO TABS: 750.0000 mg | ORAL_TABLET | Freq: Every day | ORAL | 0 refills | Status: DC

## 2019-08-21 NOTE — Progress Notes (Signed)
Subjective:    Patient ID: Patty Howard, female    DOB: 1956/01/05, 64 y.o.   MRN: [id number]  Patient being seen for obstructive sleep apnea She does have a history of obstructive lung disease  Relatively well Recently treated for COPD exacerbation Used a course of prednisone and azithromycin with nonresolution of symptoms  She is still coughing, wheezing, more short of breath than usual  She is compliant with CPAP use on a nightly basis  Diagnosed with obstructive sleep apnea about 8 to 10 years ago Her machine became dysfunctional about a year ago  History of obstructive sleep apnea  Sleep time varies significantly sometimes midnight to 3 AM Takes about 30 minutes to fall asleep Almost 4 nocturnal awakenings on her regular basis Final awakening time about 7 AM  Weight has been stable  Has a history of asthma/COPD, history of hypertension  Admits to dryness of her mouth in the mornings Admits to choking episodes at night No headaches in the mornings  Sister with OSA  Her memory is fine  Past Medical History:  Diagnosis Date  . Anxiety   . Arthritis   . Asthma   . Asthma   . COPD (chronic obstructive pulmonary disease) ([place])   . Fracture    closed displaced left radial head  . Hypertension   . Sleep apnea    does not wear CPAP   Social History   Socioeconomic History  . Marital status: Widowed    Spouse name: Not on file  . Number of children: Not on file  . Years of education: Not on file  . Highest education level: Not on file  Occupational History  . Not on file  Tobacco Use  . Smoking status: Former Smoker    Types: Cigarettes    Quit date: 06/15/1998    Years since quitting: 21.1  . Smokeless tobacco: Never Used  Substance and Sexual Activity  . Alcohol use: Never  . Drug use: Never  . Sexual activity: Not on file  Other Topics Concern  . Not on file  Social History Narrative  . Not on file   Social Determinants of Health    Financial Resource Strain:   . Difficulty of Paying Living Expenses: Not on file  Food Insecurity:   . Worried About Charity fundraiser in the Last Year: Not on file  . Ran Out of Food in the Last Year: Not on file  Transportation Needs:   . Lack of Transportation (Medical): Not on file  . Lack of Transportation (Non-Medical): Not on file  Physical Activity:   . Days of Exercise per Week: Not on file  . Minutes of Exercise per Session: Not on file  Stress:   . Feeling of Stress : Not on file  Social Connections:   . Frequency of Communication with Friends and Family: Not on file  . Frequency of Social Gatherings with Friends and Family: Not on file  . Attends Religious Services: Not on file  . Active Member of Clubs or Organizations: Not on file  . Attends Archivist Meetings: Not on file  . Marital Status: Not on file  Intimate Partner Violence:   . Fear of Current or Ex-Partner: Not on file  . Emotionally Abused: Not on file  . Physically Abused: Not on file  . Sexually Abused: Not on file   Family History  Problem Relation Age of Onset  . Hypertension Mother   . Stroke Mother   .  Cancer Father     Review of Systems  Constitutional: Negative for fever and unexpected weight change.  HENT: Positive for congestion. Negative for dental problem, ear pain, nosebleeds, postnasal drip, rhinorrhea, sinus pressure, sneezing, sore throat and trouble swallowing.   Eyes: Negative for redness and itching.  Respiratory: Positive for cough, shortness of breath and wheezing. Negative for chest tightness.   Cardiovascular: Negative for palpitations and leg swelling.  Gastrointestinal: Negative for nausea and vomiting.  Genitourinary: Negative for dysuria.  Musculoskeletal: Negative for joint swelling.  Skin: Negative for rash.  Allergic/Immunologic: Positive for environmental allergies and food allergies. Negative for immunocompromised state.  Neurological: Negative for  headaches.  Hematological: Does not bruise/bleed easily.  Psychiatric/Behavioral: Negative for dysphoric mood. The patient is nervous/anxious.        Objective:   Physical Exam Constitutional:      Appearance: She is obese.  HENT:     Head: Normocephalic and atraumatic.     Mouth/Throat:     Mouth: Mucous membranes are moist.  Eyes:     Pupils: Pupils are equal, round, and reactive to light.  Cardiovascular:     Rate and Rhythm: Normal rate.     Pulses: Normal pulses.     Heart sounds: Normal heart sounds. No murmur. No friction rub.  Pulmonary:     Effort: Pulmonary effort is normal. No respiratory distress.     Breath sounds: No stridor. Wheezing and rhonchi present.  Musculoskeletal:     Cervical back: Normal range of motion and neck supple. No rigidity or tenderness.  Skin:    General: Skin is warm.  Neurological:     General: No focal deficit present.     Mental Status: She is alert.    There were no vitals filed for this visit. Results of the Epworth flowsheet 04/05/2019  Sitting and reading 3  Watching TV 3  Sitting, inactive in a public place (e.g. a theatre or a meeting) 3  As a passenger in a car for an hour without a break 3  Lying down to rest in the afternoon when circumstances permit 3  Sitting and talking to someone 3  Sitting quietly after a lunch without alcohol 3  In a car, while stopped for a few minutes in traffic 3  Total score 24   Previous study not available to me at present  Compliance data does reveal 90% compliance Residual AHI of 8.5 Appears to have significant leaks Machine set between 5 and 18    Assessment & Plan:  Severe obstructive sleep apnea .  Compliant with CPAP .  Appears to have some mask leak issues .  Will require mask change .  Daytime sleepiness appears better  Obstructive pulmonary disease/asthma .  Exacerbation of symptoms recently .  We will treat exacerbation with course of steroids and antibiotics  Morbid  obesity .  She is working on weight loss .  Recently joined a gym  Plan: Continue CPAP therapy Prescription for mask change  Course of Levaquin 750 p.o. daily for 5 days Prednisone 10 p.o. twice daily for 5 days  Importance of weight management discussed with the patient  I will see her back in the office in about 3 months

## 2019-08-21 NOTE — Patient Instructions (Signed)
COPD with exacerbation -Levaquin 750 p.o. daily for 5 days -Prednisone 10 p.o. twice daily for 5 days  Obstructive sleep apnea -Significant mask leak -We will contact the medical supply company for CPAP supplies -The need to refit you with a different mask to cut down on the leaks  Continue using CPAP on a nightly basis Continue weight loss efforts  Continue nebulization treatments as needed  Call with significant concerns  We will see you in 3 months

## 2019-08-22 ENCOUNTER — Ambulatory Visit: Payer: Self-pay

## 2019-08-22 ENCOUNTER — Ambulatory Visit (INDEPENDENT_AMBULATORY_CARE_PROVIDER_SITE_OTHER): Payer: Medicare Other | Admitting: Orthopaedic Surgery

## 2019-08-22 ENCOUNTER — Encounter: Payer: Self-pay | Admitting: Orthopaedic Surgery

## 2019-08-22 ENCOUNTER — Ambulatory Visit (INDEPENDENT_AMBULATORY_CARE_PROVIDER_SITE_OTHER): Payer: Medicare Other

## 2019-08-22 VITALS — Ht 70.0 in | Wt 300.0 lb

## 2019-08-22 DIAGNOSIS — M25522 Pain in left elbow: Secondary | ICD-10-CM | POA: Diagnosis not present

## 2019-08-22 DIAGNOSIS — J4550 Severe persistent asthma, uncomplicated: Secondary | ICD-10-CM

## 2019-08-28 NOTE — Progress Notes (Signed)
Office Visit Note   Patient: Patty Howard           Date of Birth: 11/29/1955           MRN: [id number] Visit Date: 08/22/2019              Requested by: Sandi Mariscal, [place],  [place] [id number] PCP: Sandi Mariscal, MD   Assessment & Plan: Visit Diagnoses:  1. Pain in left elbow     Plan: We discussed using her opposite right arm for heavy lifting pushing and pulling and not using her left elbow for this. 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 Her original surgery was delayed over a year for when she first had the fracture. We discussed using her arm for only light activities only to prevent further problems. She can return if she has increased symptoms.  Follow-Up Instructions: Return if symptoms worsen or fail to improve.   Orders:  Orders Placed This Encounter  Procedures  . XR Elbow 2 Views Left   No orders of the defined types were placed in this encounter.     Procedures: No procedures performed   Clinical Data: No additional findings.   Subjective: Chief Complaint  Patient presents with  . Left Elbow - Pain    02/03/2019 Left Radial Head Arthroplasty    HPI 64 year old female returns post left radial head arthroplasty done on 02/03/2023 comminuted radial head fracture. She states she was lifting a bag of water about 2 weeks ago had increased pain in her left elbow pain when she turns her arm laterally. No locking of her elbow no swelling in her hand no numbness or tingling in her fingers. Patient originally had elbow fracture May 2019 and her surgery was delayed due to Covid twice with 2 different surgeons until she finally presented to me in August and left radial head arthroplasty was performed on 02/03/2019. Review of Systems updated unchanged since the surgery August 2020. Past  history of asthma COPD hypertension, sleep apnea on CPAP, obesity.   Objective: Vital Signs: Ht 5' 10"  (1.778 m)   Wt 300 lb (136.1 kg)   BMI 43.05 kg/m   Physical Exam Constitutional:      Appearance: She is well-developed.  HENT:     Head: Normocephalic.     Right Ear: External ear normal.     Left Ear: External ear normal.  Eyes:     Pupils: Pupils are equal, round, and reactive to light.  Neck:     Thyroid: No thyromegaly.     Trachea: No tracheal deviation.  Cardiovascular:     Rate and Rhythm: Normal rate.  Pulmonary:     Effort: Pulmonary effort is normal.  Abdominal:     Palpations: Abdomen is soft.  Skin:    General: Skin is warm and dry.  Neurological:     Mental Status: She is alert and oriented to person, place, and time.  Psychiatric:        Behavior: Behavior normal.     Ortho Exam well-healed incision over the left radial head. Patient still lacks 20 degrees reaching full extension flexion is better. No elbow effusion is noted.  Specialty Comments:  No specialty comments available.  Imaging: No results found.   PMFS History: Patient Active Problem List   Diagnosis Date Noted  .  Acute exacerbation of chronic obstructive pulmonary disease (COPD) ([place]) 05/22/2019  . Left radial head fracture 02/03/2019  . Closed fracture of radial head 01/29/2019  . Prediabetes 10/19/2018  . Hypertension, essential, benign 10/19/2018  . 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) ([place]) 10/19/2018  . Insomnia 10/19/2018  . Elbow pain, chronic, left 10/19/2018  . Asthma 05/29/2018  . COPD exacerbation ([place]) 04/05/2018  . H/O Spinal surgery 01/18/2015  . HTN (hypertension) 01/18/2015  . Obesity 01/18/2015   Past Medical History:  Diagnosis Date  . Anxiety   . Arthritis   . Asthma   . Asthma   . COPD (chronic obstructive pulmonary disease) ([place])   . Fracture    closed displaced left radial head  . Hypertension   . Sleep apnea    does not  wear CPAP    Family History  Problem Relation Age of Onset  . Hypertension Mother   . Stroke Mother   . Cancer Father     Past Surgical History:  Procedure Laterality Date  . BACK SURGERY     spinal stimulator  . COLONOSCOPY W/ BIOPSIES AND POLYPECTOMY    . DILATION AND CURETTAGE OF UTERUS    . LAPAROSCOPIC ROUX-EN-Y GASTRIC BYPASS WITH UPPER ENDOSCOPY AND REMOVAL OF LAP BAND    . RADIAL HEAD ARTHROPLASTY Left 02/03/2019   Procedure: LEFT RADIAL HEAD ARTHROPLASTY;  Surgeon: [name], MD;  Location: [facility];  Service: Orthopedics;  Laterality: Left;  AXILLARY BLOCK VS BIER BLOCK  . TUBAL LIGATION     Social History   Occupational History  . Not on file  Tobacco Use  . Smoking status: Former Smoker    Types: Cigarettes    Quit date: 06/15/1998    Years since quitting: 21.2  . Smokeless tobacco: Never Used  Substance and Sexual Activity  . Alcohol use: Never  . Drug use: Never  . Sexual activity: Not on file

## 2019-09-01 ENCOUNTER — Other Ambulatory Visit: Payer: Self-pay | Admitting: Family Medicine

## 2019-09-01 ENCOUNTER — Ambulatory Visit
Admission: RE | Admit: 2019-09-01 | Discharge: 2019-09-01 | Disposition: A | Discharge status: Home / Self Care | Payer: Medicare Other | Source: Ambulatory Visit | Attending: Family Medicine | Admitting: Family Medicine

## 2019-09-01 DIAGNOSIS — J45909 Unspecified asthma, uncomplicated: Secondary | ICD-10-CM

## 2019-09-08 ENCOUNTER — Other Ambulatory Visit: Payer: Self-pay | Admitting: Allergy & Immunology

## 2019-09-18 ENCOUNTER — Other Ambulatory Visit: Payer: Self-pay | Admitting: Allergy & Immunology

## 2019-10-17 ENCOUNTER — Ambulatory Visit: Payer: Self-pay

## 2019-10-27 ENCOUNTER — Other Ambulatory Visit: Payer: Self-pay | Admitting: Physician Assistant

## 2019-10-27 ENCOUNTER — Ambulatory Visit
Admission: RE | Admit: 2019-10-27 | Discharge: 2019-10-27 | Disposition: A | Discharge status: Home / Self Care | Payer: Medicare Other | Source: Ambulatory Visit | Attending: Physician Assistant | Admitting: Physician Assistant

## 2019-10-27 DIAGNOSIS — J449 Chronic obstructive pulmonary disease, unspecified: Secondary | ICD-10-CM

## 2019-10-27 DIAGNOSIS — R0602 Shortness of breath: Secondary | ICD-10-CM

## 2019-10-30 ENCOUNTER — Ambulatory Visit (INDEPENDENT_AMBULATORY_CARE_PROVIDER_SITE_OTHER): Payer: Medicare Other

## 2019-10-30 ENCOUNTER — Other Ambulatory Visit: Payer: Self-pay

## 2019-10-30 DIAGNOSIS — J4550 Severe persistent asthma, uncomplicated: Secondary | ICD-10-CM | POA: Diagnosis not present

## 2019-10-31 ENCOUNTER — Other Ambulatory Visit: Payer: Self-pay | Admitting: Allergy & Immunology

## 2019-10-31 ENCOUNTER — Ambulatory Visit (INDEPENDENT_AMBULATORY_CARE_PROVIDER_SITE_OTHER): Payer: Medicare Other | Admitting: Allergy & Immunology

## 2019-10-31 ENCOUNTER — Telehealth: Payer: Self-pay

## 2019-10-31 ENCOUNTER — Encounter: Payer: Self-pay | Admitting: Allergy & Immunology

## 2019-10-31 VITALS — BP 150/82 | HR 75 | Temp 97.2°F | Resp 18 | Ht 70.0 in | Wt 303.6 lb

## 2019-10-31 DIAGNOSIS — B999 Unspecified infectious disease: Secondary | ICD-10-CM

## 2019-10-31 DIAGNOSIS — R0602 Shortness of breath: Secondary | ICD-10-CM

## 2019-10-31 DIAGNOSIS — J4550 Severe persistent asthma, uncomplicated: Secondary | ICD-10-CM

## 2019-10-31 DIAGNOSIS — J3089 Other allergic rhinitis: Secondary | ICD-10-CM

## 2019-10-31 DIAGNOSIS — R0789 Other chest pain: Secondary | ICD-10-CM

## 2019-10-31 DIAGNOSIS — R079 Chest pain, unspecified: Secondary | ICD-10-CM

## 2019-10-31 DIAGNOSIS — J441 Chronic obstructive pulmonary disease with (acute) exacerbation: Secondary | ICD-10-CM

## 2019-10-31 DIAGNOSIS — K219 Gastro-esophageal reflux disease without esophagitis: Secondary | ICD-10-CM | POA: Diagnosis not present

## 2019-10-31 MED ORDER — PERFOROMIST 20 MCG/2ML IN NEBU: 20.0000 ug | INHALATION_SOLUTION | Freq: Two times a day (BID) | RESPIRATORY_TRACT | 5 refills | Status: DC

## 2019-10-31 MED ORDER — YUPELRI 175 MCG/3ML IN SOLN: 175.0000 ug | Freq: Every day | RESPIRATORY_TRACT | 5 refills | Status: DC

## 2019-10-31 MED ORDER — BUDESONIDE 0.5 MG/2ML IN SUSP: 0.5000 mg | Freq: Two times a day (BID) | RESPIRATORY_TRACT | 5 refills | Status: DC

## 2019-10-31 NOTE — Patient Instructions (Addendum)
1. Severe persistent asthma, uncomplicated - Lung testing looked stable.  - We are going to order full pulmonary function testing. - We are going to get a chest CT as well.  - We will restart your nebulized medications.  - Stop your current Breo and Incruse.  - Daily controller medication(s): Pulmicort 0.89m (inhaled steroid) TWO TIMES DAILY + Perforomist 251m TWO TIMES DAILY + Yupelri ONE TIME DAILY + Fasenra every 8 weeks  - Prior to physical activity: albuterol 2 puffs 10-15 minutes before physical activity. - Rescue medications: albuterol 4 puffs every 4-6 hours as needed, albuterol nebulizer one vial every 4-6 hours as needed or DuoNeb nebulizer one vial every 4-6 hours as needed - Asthma control goals:  * Full participation in all desired activities (may need albuterol before activity) * Albuterol use two time or less a week on average (not counting use with activity) * Cough interfering with sleep two time or less a month * Oral steroids no more than once a year * No hospitalizations  2. Chronic rhinitis (dust mites, molds) - Continue with Xyzal (levocetirizine) 36m77maily. - Continue with Flonase one spray per nostril twice daily to control any postnasal drip.  - Finish out your prednisone and your antibiotics.   3. Gastroesophageal reflux disease - Continue with Protonix 17m64mce daily to control any silent reflux.  4. Return in about 4 weeks (around 11/28/2019). This can be an in-person, a virtual Webex or a telephone follow up visit.   Please inform us o[place]oreaany Emergency Department visits, hospitalizations, or changes in symptoms. Call us bKoreaore going to the ED for breathing or allergy symptoms since we might be able to fit you in for a sick visit. Feel free to contact us aKoreatime with any questions, problems, or concerns.  It was a pleasure to see you again today!  Websites that have reliable patient information: 1. American Academy of Asthma, Allergy, and Immunology:  www.aaaai.org 2. Food Allergy Research and Education (FARE): foodallergy.org 3. Mothers of Asthmatics: http://www.asthmacommunitynetwork.org 4. American College of Allergy, Asthma, and Immunology: www.acaai.org   COVID-19 Vaccine Information can be found at: [email or web address] questions related to vaccine distribution or appointments, please email [email or web address] or call [phone]  "Like" us oKoreaFacebook and Instagram for our latest updates!       HAPPY SPRING!  Make sure you are registered to vote! If you have moved or changed any of your contact information, you will need to get this updated before voting!  In some cases, you MAY be able to register to vote online: [email or web address]

## 2019-10-31 NOTE — Telephone Encounter (Signed)
Call to patient to remind her to call the [facility] imaging.  Phone number on the paper given to her at the appointment to schedule her CT of the chest.  No answer. Call to patient son to ask him to remind her to call the imaging center.  Pt son states that he will remind her when he gets home as he was pulling up to the house.   Crystal from the imaging center states that she has called her twice to schedule and has left two messages to call back. But Patty Howard has yet to return the call.

## 2019-10-31 NOTE — Telephone Encounter (Signed)
Call to patient and left message that she needed to call for the CT of her chest and schedule.

## 2019-10-31 NOTE — Telephone Encounter (Signed)
Call to [facility] imaging to schedule CT of the Chest w/o contrast. [phone].  Spoke with [employer], her earliest opening is on 11/09/19 at 7:30 AM.  Donella Stade will call Rhanda to confirm that this time is okay for her to come and is supposed to call us back to let us know when her exact appointment date is.   Call to [facility] health care provider line. [phone] CPT code given [id number] Spoke with Hedy Camara.  No prior authorization or approval needed.

## 2019-10-31 NOTE — Progress Notes (Signed)
FOLLOW UP  Date of Service/Encounter:  10/31/19   Assessment:   Severe persistent asthma, uncomplicated  Perennial and seasonal allergic rhinitis  Gastroesophageal reflux disease  Morbid obesity  Plan/Recommendations:   1. Severe persistent asthma, uncomplicated - Lung testing looked stable.  - We are going to order full pulmonary function testing. - We are going to get a chest CT as well.  - We will restart your nebulized medications.  - Stop your current Breo and Incruse.  - Daily controller medication(s): Pulmicort 0.830m (inhaled steroid) TWO TIMES DAILY + Perforomist 212m TWO TIMES DAILY + Yupelri ONE TIME DAILY + Fasenra every 8 weeks  - Prior to physical activity: albuterol 2 puffs 10-15 minutes before physical activity. - Rescue medications: albuterol 4 puffs every 4-6 hours as needed, albuterol nebulizer one vial every 4-6 hours as needed or DuoNeb nebulizer one vial every 4-6 hours as needed - Asthma control goals:  * Full participation in all desired activities (may need albuterol before activity) * Albuterol use two time or less a week on average (not counting use with activity) * Cough interfering with sleep two time or less a month * Oral steroids no more than once a year * No hospitalizations  2. Chronic rhinitis (dust mites, molds) - Continue with Xyzal (levocetirizine) 30m84maily. - Continue with Flonase one spray per nostril twice daily to control any postnasal drip.  - Finish out your prednisone and your antibiotics.   3. Gastroesophageal reflux disease - Continue with Protonix 41m29mce daily to control any silent reflux.  4. Return in about 4 weeks (around 11/28/2019). This can be an in-person, a virtual Webex or a telephone follow up visit.   Subjective:   Patty Howard 63 y39. female presenting today for follow up of  Chief Complaint  Patient presents with  . Asthma    having issues today, had an xray 3 days ago, having back  pain on her side,    Patty Howard a history of the following: Patient Active Problem List   Diagnosis Date Noted  . Acute exacerbation of chronic obstructive pulmonary disease (COPD) (H[facility])[place]/12/2018  . Left radial head fracture 02/03/2019  . Closed fracture of radial head 01/29/2019  . Prediabetes 10/19/2018  . Hypertension, essential, benign 10/19/2018  . 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) (H[facility])[place]/11/2018  . Insomnia 10/19/2018  . Elbow pain, chronic, left 10/19/2018  . Asthma 05/29/2018  . COPD exacerbation (H[facility])[place]/22/2019  . Dyspnea 10/12/2017  . Asthma attack 07/11/2017  . Pancolitis (HCC)[place]/11/2016  . H/O Spinal surgery 01/18/2015  . HTN (hypertension) 01/18/2015  . Obesity 01/18/2015    History obtained from: chart review and patient.  Patty Howard 63 y41. female presenting for a follow up visit. She was last seen in November 2020. At that time, her lung testing looks slightly better.  We continued her on her nebulized medications including Pulmicort twice daily as well as Perforomist twice daily and Yupelri daily.  We also continued Fasenra every 8 weeks.  For her rhinitis, we started her on Xyzal 5 mg in place of Zyrtec.  We added on Mucinex and doxycycline for overlying sinusitis.  We continue Protonix for her reflux.    In the interim, insurance would not cover any of the nebulized medications. She was changed back to BreoRufusen Incruse was added at some point. She was on the combination when I first met her around one year ago.   Since  the last visit, she has actually developed. She is taking ibuprofen 800 mg as needed. This has been ongoing for 6 months. She reports that something with the left side of lung. The right side is normal. She had a normal CXR last week. She reports that she is always wheezing and she has had several courses of prednisone. She feels that the breathing is always best with prednisone. She also definitely felt  better on the nebulized medications.   She sees Pulmonology and in March 2021 she was placed on Levaquin. She was most recently placed on Levaquin by [facility]. This is who put her on the recent course of prednisone and Levaquin.   Asthma/Respiratory Symptom History: She is back on Breo and Incruse.  She feels like the nebulized medications worked better.  Review of her phone notes show that they were approved at some point, but I'm unsure when she was changed back to the [place] and Incruse.  Regardless, she would like to go back to the nebulized medications.  She has been compliant with the Berna Bue and has not noticed any difference.  Clearly, she has still required multiple courses of prednisone since we started her on Fasenra.  She has never had a chest CT.  She does follow with pulmonology, but mostly for management of her. She was on prednisone in April 2020 x 3 courses and May x 1.  Her chest x-ray from last week was normal.   She has never had any kind of autoimmune work-up to see if this might be sarcoidosis or a manifestation of arthritis.  She does think that she needs full pulmonary function testing, as it seems that her pulmonologist has not done this from what I can tell.  Allergic Rhinitis Symptom History: She remains on Xyzal 5 mg daily.  She also has a nose spray that she uses as needed.  She has needed antibiotics twice since last visit, Levaquin both times.  Otherwise, there have been no changes to her past medical history, surgical history, family history, or social history.    Review of Systems  Constitutional: Negative.  Negative for chills, fever, malaise/fatigue and weight loss.  HENT: Negative.  Negative for congestion, ear discharge, ear pain, sinus pain and sore throat.   Eyes: Negative for pain, discharge and redness.  Respiratory: Positive for cough, shortness of breath and wheezing. Negative for sputum production.   Cardiovascular: Negative.  Negative for chest  pain and palpitations.  Gastrointestinal: Negative for abdominal pain, constipation, diarrhea, heartburn, nausea and vomiting.  Skin: Negative.  Negative for itching and rash.  Neurological: Negative for dizziness and headaches.  Endo/Heme/Allergies: Negative for environmental allergies. Does not bruise/bleed easily.       Objective:   Blood pressure (!) 150/82, pulse 75, temperature (!) 97.2 F (36.2 C), temperature source Temporal, resp. rate 18, height 5' 10"  (1.778 m), weight (!) 303 lb 9.6 oz (137.7 kg), SpO2 96 %. Body mass index is 43.56 kg/m.   Physical Exam:  Physical Exam  Constitutional: She appears well-developed.  Very pleasant female.  Talking in full sentences.  HENT:  Head: Normocephalic and atraumatic.  Right Ear: Tympanic membrane, external ear and ear canal normal.  Left Ear: Tympanic membrane, external ear and ear canal normal.  Nose: No mucosal edema, rhinorrhea, nasal deformity or septal deviation. No epistaxis. Right sinus exhibits no maxillary sinus tenderness and no frontal sinus tenderness. Left sinus exhibits no maxillary sinus tenderness and no frontal sinus tenderness.  Mouth/Throat: Uvula  is midline and oropharynx is clear and moist. Mucous membranes are not pale and not dry.  Turbinates enlarged present clear rhinorrhea.  No polyps appreciated.  Septum does appear midline.  Eyes: Pupils are equal, round, and reactive to light. Conjunctivae and EOM are normal. Right eye exhibits no chemosis and no discharge. Left eye exhibits no chemosis and no discharge. Right conjunctiva is not injected. Left conjunctiva is not injected.  Cardiovascular: Normal rate, regular rhythm and normal heart sounds.  Respiratory: Effort normal and breath sounds normal. No accessory muscle usage. No tachypnea. No respiratory distress. She has no wheezes. She has no rhonchi. She has no rales. She exhibits no tenderness.  Lymphadenopathy:    She has no cervical adenopathy.    Neurological: She is alert.  Skin: No abrasion, no petechiae and no rash noted. Rash is not papular, not vesicular and not urticarial. No erythema. No pallor.  No eczematous or urticarial lesions noted.   Psychiatric: She has a normal mood and affect.     Diagnostic studies:    Spirometry: results abnormal (FEV1: 1.27/50%, FVC: 1.76/54%, FEV1/FVC: 72%).    Spirometry consistent with possible restrictive disease. .  Labs sent instead.      Salvatore Marvel, MD  Allergy and [place] [facility] [place]

## 2019-11-01 ENCOUNTER — Telehealth: Payer: Self-pay

## 2019-11-01 DIAGNOSIS — R079 Chest pain, unspecified: Secondary | ICD-10-CM

## 2019-11-01 NOTE — Telephone Encounter (Signed)
Patient called stating she called the imaging center to set up an appointment for the CT Scan and was told her appointment would be in June. Patient states she can not wait that long so they told her another order would need to be ordered as STAT and she can get it as soon as tomorrow. Please advise.

## 2019-11-02 ENCOUNTER — Other Ambulatory Visit: Payer: Self-pay

## 2019-11-02 ENCOUNTER — Ambulatory Visit
Admission: RE | Admit: 2019-11-02 | Discharge: 2019-11-02 | Disposition: A | Discharge status: Home / Self Care | Payer: Medicare Other | Source: Ambulatory Visit | Attending: Allergy & Immunology | Admitting: Allergy & Immunology

## 2019-11-02 MED ORDER — ALBUTEROL SULFATE HFA 108 (90 BASE) MCG/ACT IN AERS: INHALATION_SPRAY | RESPIRATORY_TRACT | 1 refills | Status: DC

## 2019-11-02 NOTE — Telephone Encounter (Signed)
Refill has been sent in. Called patient and left a voicemail asking to return call to discuss this and her CT scan.

## 2019-11-02 NOTE — Telephone Encounter (Signed)
Patient is very concerned about having to wait until her CT scan until June 15th and she feels that it needs to be a stat order so that she can get in sooner for her CT. Please advise. She state that she will call back around 2 today for an update.

## 2019-11-02 NOTE — Telephone Encounter (Signed)
Patient needs a refill on her albuterol sent to [facility] (already called pharmacy).  Patient called and states she still has chest pain and shortness of breath. Patient is checking on STAT referral for CT scan, so she can get it done as soon as possible.   Please advise.

## 2019-11-02 NOTE — Telephone Encounter (Signed)
Called and spoke to [place] at [facility] and was able to get patient scheduled today at 4:30 for the STAT CT scan. She is also able to walk in as well per [place]. I called patient and left her a voice message with the information.

## 2019-11-02 NOTE — Telephone Encounter (Signed)
Thanks much!  Salvatore Marvel, MD Allergy and [place] [facility] [place]

## 2019-11-02 NOTE — Addendum Note (Signed)
Addended by: Jamelle Haring B on: 11/02/2019 12:07 PM   Modules accepted: Orders

## 2019-11-03 LAB — ASPERGILLUS PRECIPITINS
A.Fumigatus #1 Abs: NEGATIVE
Aspergillus Flavus Antibodies: NEGATIVE
Aspergillus Niger Antibodies: NEGATIVE
Aspergillus glaucus IgG: NEGATIVE
Aspergillus nidulans IgG: NEGATIVE
Aspergillus terreus IgG: NEGATIVE

## 2019-11-03 LAB — ANCA TITERS
Atypical pANCA: <1:20 {titer}
C-ANCA: <1:20 {titer}
P-ANCA: <1:20 {titer}

## 2019-11-03 LAB — ANTINUCLEAR ANTIBODIES, IFA: ANA Titer 1: NEGATIVE

## 2019-11-03 LAB — ANGIOTENSIN CONVERTING ENZYME: Angio Convert Enzyme: 42 U/L (ref 14–82)

## 2019-11-03 LAB — RHEUMATOID FACTOR: Rheumatoid fact SerPl-aCnc: 11.6 IU/mL (ref 0.0–13.9)

## 2019-11-03 LAB — ALPHA-1-ANTITRYPSIN: A-1 Antitrypsin: 121 mg/dL (ref 101–187)

## 2019-11-03 LAB — SEDIMENTATION RATE: Sed Rate: 27 mm/hr (ref 0–40)

## 2019-11-03 LAB — C-REACTIVE PROTEIN: CRP: 6 mg/L (ref 0–10)

## 2019-11-08 LAB — STREP PNEUMONIAE 23 SEROTYPES IGG
Pneumo Ab Type 1*: 0.9 ug/mL — ABNORMAL LOW (ref >1.3)
Pneumo Ab Type 12 (12F)*: <0.1 ug/mL — ABNORMAL LOW (ref >1.3)
Pneumo Ab Type 14*: 0.5 ug/mL — ABNORMAL LOW (ref >1.3)
Pneumo Ab Type 17 (17F)*: 0.9 ug/mL — ABNORMAL LOW (ref >1.3)
Pneumo Ab Type 19 (19F)*: 1.5 ug/mL (ref >1.3)
Pneumo Ab Type 2*: 1.5 ug/mL (ref >1.3)
Pneumo Ab Type 20*: 2.9 ug/mL (ref >1.3)
Pneumo Ab Type 22 (22F)*: 0.6 ug/mL — ABNORMAL LOW (ref >1.3)
Pneumo Ab Type 23 (23F)*: 0.2 ug/mL — ABNORMAL LOW (ref >1.3)
Pneumo Ab Type 26 (6B)*: 0.9 ug/mL — ABNORMAL LOW (ref >1.3)
Pneumo Ab Type 3*: 2.1 ug/mL (ref >1.3)
Pneumo Ab Type 34 (10A)*: 0.9 ug/mL — ABNORMAL LOW (ref >1.3)
Pneumo Ab Type 4*: 2 ug/mL (ref >1.3)
Pneumo Ab Type 43 (11A)*: 0.8 ug/mL — ABNORMAL LOW (ref >1.3)
Pneumo Ab Type 5*: 2.6 ug/mL (ref >1.3)
Pneumo Ab Type 51 (7F)*: 0.5 ug/mL — ABNORMAL LOW (ref >1.3)
Pneumo Ab Type 54 (15B)*: <0.1 ug/mL — ABNORMAL LOW (ref >1.3)
Pneumo Ab Type 56 (18C)*: 0.1 ug/mL — ABNORMAL LOW (ref >1.3)
Pneumo Ab Type 57 (19A)*: 2.4 ug/mL (ref >1.3)
Pneumo Ab Type 68 (9V)*: 0.5 ug/mL — ABNORMAL LOW (ref >1.3)
Pneumo Ab Type 70 (33F)*: 0.8 ug/mL — ABNORMAL LOW (ref >1.3)
Pneumo Ab Type 8*: 1.1 ug/mL — ABNORMAL LOW (ref >1.3)
Pneumo Ab Type 9 (9N)*: 0.4 ug/mL — ABNORMAL LOW (ref >1.3)

## 2019-11-08 LAB — CBC WITH DIFFERENTIAL
Basophils Absolute: 0 10*3/uL (ref 0.0–0.2)
Basos: 0 %
EOS (ABSOLUTE): 0 10*3/uL (ref 0.0–0.4)
Eos: 0 %
Hematocrit: 36.5 % (ref 34.0–46.6)
Hemoglobin: 11.6 g/dL (ref 11.1–15.9)
Immature Grans (Abs): 0 10*3/uL (ref 0.0–0.1)
Immature Granulocytes: 0 %
Lymphocytes Absolute: 2.3 10*3/uL (ref 0.7–3.1)
Lymphs: 26 %
MCH: 26.2 pg — ABNORMAL LOW (ref 26.6–33.0)
MCHC: 31.8 g/dL (ref 31.5–35.7)
MCV: 82 fL (ref 79–97)
Monocytes Absolute: 0.4 10*3/uL (ref 0.1–0.9)
Monocytes: 4 %
Neutrophils Absolute: 6.1 10*3/uL (ref 1.4–7.0)
Neutrophils: 70 %
RBC: 4.43 x10E6/uL (ref 3.77–5.28)
RDW: 14.5 % (ref 11.7–15.4)
WBC: 8.9 10*3/uL (ref 3.4–10.8)

## 2019-11-08 LAB — DIPHTHERIA / TETANUS ANTIBODY PANEL
Diphtheria Ab: 0.35 IU/mL (ref <0.10)
Tetanus Ab, IgG: 0.81 IU/mL (ref <0.10)

## 2019-11-08 LAB — COMPLEMENT, TOTAL: Compl, Total (CH50): >60 U/mL (ref >41)

## 2019-11-08 LAB — IGG, IGA, IGM
IgA/Immunoglobulin A, Serum: 320 mg/dL (ref 87–352)
IgG (Immunoglobin G), Serum: 664 mg/dL (ref 586–1602)
IgM (Immunoglobulin M), Srm: 62 mg/dL (ref 26–217)

## 2019-11-10 ENCOUNTER — Telehealth: Payer: Self-pay

## 2019-11-10 NOTE — Telephone Encounter (Signed)
NOTES ON FILE FROM [facility] [phone] SENT REFERRAL TO SCHEDULING

## 2019-11-20 NOTE — Progress Notes (Deleted)
Cardiology Office Note   Date:  11/20/2019   ID:  Patty Howard, DOB 1955/08/22, MRN [id number]  PCP:  Loretha Brasil, FNP  Cardiologist:   No primary care provider on file. Referring:  ***  No chief complaint on file.     History of Present Illness: Patty Howard is a 64 y.o. female who presents for ***    CT did demonstrate aortic atherosclerosis.  ***    Past Medical History:  Diagnosis Date  . Anxiety   . Arthritis   . Asthma   . Asthma   . COPD (chronic obstructive pulmonary disease) ([place])   . Fracture    closed displaced left radial head  . Hypertension   . Sleep apnea    does not wear CPAP    Past Surgical History:  Procedure Laterality Date  . BACK SURGERY     spinal stimulator  . COLONOSCOPY W/ BIOPSIES AND POLYPECTOMY    . DILATION AND CURETTAGE OF UTERUS    . LAPAROSCOPIC ROUX-EN-Y GASTRIC BYPASS WITH UPPER ENDOSCOPY AND REMOVAL OF LAP BAND    . RADIAL HEAD ARTHROPLASTY Left 02/03/2019   Procedure: LEFT RADIAL HEAD ARTHROPLASTY;  Surgeon: [name], MD;  Location: [facility];  Service: Orthopedics;  Laterality: Left;  AXILLARY BLOCK VS BIER BLOCK  . TUBAL LIGATION       Current Outpatient Medications  Medication Sig Dispense Refill  . acetaminophen (TYLENOL) 500 MG tablet Take 1,000 mg by mouth every 6 (six) hours as needed for moderate pain.    Marland Kitchen albuterol (VENTOLIN HFA) 108 (90 Base) MCG/ACT inhaler INHALE 1-2 PUFFS INTO THE LUNGS EVERY 6 (SIX) HOURS AS NEEDED FOR WHEEZING OR SHORTNESS OF BREATH. 18 g 1  . budesonide (PULMICORT) 0.5 MG/2ML nebulizer solution Take 2 mLs (0.5 mg total) by nebulization 2 (two) times daily. 120 mL 5  . Cholecalciferol (VITAMIN D3 PO) Take 1 tablet by mouth daily.     . cyclobenzaprine (FLEXERIL) 10 MG tablet Take 10 mg by mouth 2 (two) times daily as needed.    . fluticasone (FLONASE) 50 MCG/ACT nasal spray Place 2 sprays into both nostrils daily as needed for allergies. 48 g 1  . formoterol (PERFOROMIST)  20 MCG/2ML nebulizer solution Take 2 mLs (20 mcg total) by nebulization 2 (two) times daily. 120 mL 5  . INCRUSE ELLIPTA 62.5 MCG/INH AEPB Inhale 1 puff into the lungs daily.    Marland Kitchen ipratropium-albuterol (DUONEB) 0.5-2.5 (3) MG/3ML SOLN Inhale 3 mLs into the lungs every 6 (six) hours as needed. 360 mL 1  . levocetirizine (XYZAL) 5 MG tablet Take 1 tablet (5 mg total) by mouth every evening. 90 tablet 1  . losartan-hydrochlorothiazide (HYZAAR) 100-25 MG tablet Take 1 tablet by mouth daily.    . Multiple Vitamin (MULTIVITAMIN WITH MINERALS) TABS tablet Take 1 tablet by mouth daily. Centrum Multivitamin    . revefenacin (YUPELRI) 175 MCG/3ML nebulizer solution Inhale 3 mLs (175 mcg total) into the lungs daily. 90 mL 5  . Vitamin D, Ergocalciferol, (DRISDOL) 1.25 MG (50000 UNIT) CAPS capsule Take 50,000 Units by mouth once a week.     Current Facility-Administered Medications  Medication Dose Route Frequency Provider Last Rate Last Admin  . Benralizumab SOSY 30 mg  30 mg Subcutaneous Q8 Toney Reil, MD   30 mg at 10/30/19 7106    Allergies:   Banana    Social History:  The patient  reports that she quit smoking about 21 years ago.  Her smoking use included cigarettes. She has never used smokeless tobacco. She reports that she does not drink alcohol or use drugs.   Family History:  The patient's ***family history includes Cancer in her father; Hypertension in her mother; Stroke in her mother.    ROS:  Please see the history of present illness.   Otherwise, review of systems are positive for {NONE DEFAULTED:18576::"none"}.   All other systems are reviewed and negative.    PHYSICAL EXAM: VS:  There were no vitals taken for this visit. , BMI There is no height or weight on file to calculate BMI. GENERAL:  Well appearing HEENT:  Pupils equal round and reactive, fundi not visualized, oral mucosa unremarkable NECK:  No jugular venous distention, waveform within normal limits, carotid  upstroke brisk and symmetric, no bruits, no thyromegaly LYMPHATICS:  No cervical, inguinal adenopathy LUNGS:  Clear to auscultation bilaterally BACK:  No CVA tenderness CHEST:  Unremarkable HEART:  PMI not displaced or sustained,S1 and S2 within normal limits, no S3, no S4, no clicks, no rubs, *** murmurs ABD:  Flat, positive bowel sounds normal in frequency in pitch, no bruits, no rebound, no guarding, no midline pulsatile mass, no hepatomegaly, no splenomegaly EXT:  2 plus pulses throughout, no edema, no cyanosis no clubbing SKIN:  No rashes no nodules NEURO:  Cranial nerves II through XII grossly intact, motor grossly intact throughout PSYCH:  Cognitively intact, oriented to person place and time    EKG:  EKG {ACTION; IS/IS ZCH:88502774} ordered today. The ekg ordered today demonstrates ***   Recent Labs: 05/22/2019: ALT 18 06/03/2019: BUN 15; Creatinine, Ser 0.62; Magnesium 1.9; Platelets 258; Potassium 4.1; Sodium 141 10/31/2019: Hemoglobin 11.6    Lipid Panel No results found for: CHOL, TRIG, HDL, CHOLHDL, VLDL, LDLCALC, LDLDIRECT    Wt Readings from Last 3 Encounters:  10/31/19 (!) 303 lb 9.6 oz (137.7 kg)  08/22/19 300 lb (136.1 kg)  08/21/19 (!) 308 lb 9.6 oz (140 kg)      Other studies Reviewed: Additional studies/ records that were reviewed today include: ***. Review of the above records demonstrates:  Please see elsewhere in the note.  ***   ASSESSMENT AND PLAN:  AORTIC ATHEROSCLEROSIS: ***   Current medicines are reviewed at length with the patient today.  The patient {ACTIONS; HAS/DOES NOT HAVE:19233} concerns regarding medicines.  The following changes have been made:  {PLAN; NO CHANGE:13088:s}  Labs/ tests ordered today include: *** No orders of the defined types were placed in this encounter.    Disposition:   FU with ***    Signed, Minus Breeding, MD  11/20/2019 8:21 PM    [facility]

## 2019-11-21 ENCOUNTER — Ambulatory Visit: Payer: Medicare Other | Admitting: Cardiology

## 2019-11-21 ENCOUNTER — Telehealth: Payer: Self-pay

## 2019-11-21 NOTE — Telephone Encounter (Signed)
Spoke with patient. Patient did not show for her 1:40p appointment. Patient thought she was on for 6/9 at 1:20p. Informed patient she had been rescheduled to today at 1:40pm, patient confirmed she had been rescheduled but forgot. Patient rescheduled for 6/18 with Dr. Percival Spanish. Patient verbalized understanding.

## 2019-11-22 ENCOUNTER — Ambulatory Visit: Payer: Medicare Other | Admitting: Cardiology

## 2019-11-22 ENCOUNTER — Telehealth: Payer: Self-pay

## 2019-11-22 NOTE — Telephone Encounter (Signed)
FAXED NOTES TO [facility]

## 2019-11-26 ENCOUNTER — Observation Stay (HOSPITAL_COMMUNITY): Payer: Medicare Other

## 2019-11-26 ENCOUNTER — Observation Stay (HOSPITAL_COMMUNITY)
Admission: EM | Admit: 2019-11-26 | Discharge: 2019-11-28 | Disposition: A | Discharge status: Home / Self Care | Payer: Medicare Other | Attending: Family Medicine | Admitting: Family Medicine

## 2019-11-26 ENCOUNTER — Emergency Department (HOSPITAL_COMMUNITY): Payer: Medicare Other

## 2019-11-26 ENCOUNTER — Other Ambulatory Visit: Payer: Self-pay

## 2019-11-26 ENCOUNTER — Observation Stay (HOSPITAL_BASED_OUTPATIENT_CLINIC_OR_DEPARTMENT_OTHER): Payer: Medicare Other

## 2019-11-26 ENCOUNTER — Encounter (HOSPITAL_COMMUNITY): Payer: Self-pay | Admitting: *Deleted

## 2019-11-26 DIAGNOSIS — Z87891 Personal history of nicotine dependence: Secondary | ICD-10-CM | POA: Diagnosis not present

## 2019-11-26 DIAGNOSIS — Z79899 Other long term (current) drug therapy: Secondary | ICD-10-CM | POA: Insufficient documentation

## 2019-11-26 DIAGNOSIS — G4733 Obstructive sleep apnea (adult) (pediatric): Secondary | ICD-10-CM

## 2019-11-26 DIAGNOSIS — Z20822 Contact with and (suspected) exposure to covid-19: Secondary | ICD-10-CM | POA: Insufficient documentation

## 2019-11-26 DIAGNOSIS — J45901 Unspecified asthma with (acute) exacerbation: Secondary | ICD-10-CM | POA: Diagnosis present

## 2019-11-26 DIAGNOSIS — R7303 Prediabetes: Secondary | ICD-10-CM | POA: Diagnosis present

## 2019-11-26 DIAGNOSIS — Z7951 Long term (current) use of inhaled steroids: Secondary | ICD-10-CM | POA: Insufficient documentation

## 2019-11-26 DIAGNOSIS — M1990 Unspecified osteoarthritis, unspecified site: Secondary | ICD-10-CM | POA: Insufficient documentation

## 2019-11-26 DIAGNOSIS — Z7952 Long term (current) use of systemic steroids: Secondary | ICD-10-CM | POA: Diagnosis not present

## 2019-11-26 DIAGNOSIS — I5030 Unspecified diastolic (congestive) heart failure: Secondary | ICD-10-CM | POA: Diagnosis not present

## 2019-11-26 DIAGNOSIS — Z6841 Body Mass Index (BMI) 40.0 and over, adult: Secondary | ICD-10-CM | POA: Diagnosis not present

## 2019-11-26 DIAGNOSIS — R079 Chest pain, unspecified: Secondary | ICD-10-CM

## 2019-11-26 DIAGNOSIS — J441 Chronic obstructive pulmonary disease with (acute) exacerbation: Secondary | ICD-10-CM | POA: Diagnosis not present

## 2019-11-26 DIAGNOSIS — J4551 Severe persistent asthma with (acute) exacerbation: Secondary | ICD-10-CM | POA: Diagnosis not present

## 2019-11-26 DIAGNOSIS — D72829 Elevated white blood cell count, unspecified: Secondary | ICD-10-CM | POA: Diagnosis not present

## 2019-11-26 DIAGNOSIS — R739 Hyperglycemia, unspecified: Secondary | ICD-10-CM | POA: Diagnosis not present

## 2019-11-26 DIAGNOSIS — I10 Essential (primary) hypertension: Secondary | ICD-10-CM | POA: Diagnosis not present

## 2019-11-26 DIAGNOSIS — G4730 Sleep apnea, unspecified: Secondary | ICD-10-CM | POA: Diagnosis present

## 2019-11-26 DIAGNOSIS — Z7982 Long term (current) use of aspirin: Secondary | ICD-10-CM | POA: Insufficient documentation

## 2019-11-26 DIAGNOSIS — I110 Hypertensive heart disease with heart failure: Secondary | ICD-10-CM | POA: Diagnosis not present

## 2019-11-26 HISTORY — DX: Chest pain, unspecified: R07.9

## 2019-11-26 LAB — CBC
HCT: 36.1 % (ref 36.0–46.0)
Hemoglobin: 10.9 g/dL — ABNORMAL LOW (ref 12.0–15.0)
MCH: 26.5 pg (ref 26.0–34.0)
MCHC: 30.2 g/dL (ref 30.0–36.0)
MCV: 87.8 fL (ref 80.0–100.0)
Platelets: 210 10*3/uL (ref 150–400)
RBC: 4.11 MIL/uL (ref 3.87–5.11)
RDW: 15.9 % — ABNORMAL HIGH (ref 11.5–15.5)
WBC: 8.3 10*3/uL (ref 4.0–10.5)
nRBC: 0 % (ref 0.0–0.2)

## 2019-11-26 LAB — BASIC METABOLIC PANEL
Anion gap: 10 (ref 5–15)
BUN: 21 mg/dL (ref 8–23)
CO2: 24 mmol/L (ref 22–32)
Calcium: 8.9 mg/dL (ref 8.9–10.3)
Chloride: 107 mmol/L (ref 98–111)
Creatinine, Ser: 0.86 mg/dL (ref 0.44–1.00)
GFR calc Af Amer: >60 mL/min (ref >60)
GFR calc non Af Amer: >60 mL/min (ref >60)
Glucose, Bld: 194 mg/dL — ABNORMAL HIGH (ref 70–99)
Potassium: 5 mmol/L (ref 3.5–5.1)
Sodium: 141 mmol/L (ref 135–145)

## 2019-11-26 LAB — SARS CORONAVIRUS 2 BY RT PCR (HOSPITAL ORDER, PERFORMED IN ~~LOC~~ HOSPITAL LAB): SARS Coronavirus 2: NEGATIVE

## 2019-11-26 LAB — ECHOCARDIOGRAM COMPLETE
Height: 70 in
Weight: 4800 oz

## 2019-11-26 LAB — D-DIMER, QUANTITATIVE: D-Dimer, Quant: 1.09 ug/mL-FEU — ABNORMAL HIGH (ref 0.00–0.50)

## 2019-11-26 LAB — HEMOGLOBIN A1C
Hgb A1c MFr Bld: 6.7 % — ABNORMAL HIGH (ref 4.8–5.6)
Mean Plasma Glucose: 145.59 mg/dL

## 2019-11-26 LAB — TROPONIN I (HIGH SENSITIVITY)
Troponin I (High Sensitivity): 13 ng/L (ref <18)
Troponin I (High Sensitivity): 13 ng/L (ref <18)

## 2019-11-26 LAB — GLUCOSE, CAPILLARY
Glucose-Capillary: 119 mg/dL — ABNORMAL HIGH (ref 70–99)
Glucose-Capillary: 146 mg/dL — ABNORMAL HIGH (ref 70–99)

## 2019-11-26 LAB — BRAIN NATRIURETIC PEPTIDE: B Natriuretic Peptide: 51.3 pg/mL (ref 0.0–100.0)

## 2019-11-26 LAB — CBG MONITORING, ED: Glucose-Capillary: 105 mg/dL — ABNORMAL HIGH (ref 70–99)

## 2019-11-26 MED ORDER — SODIUM CHLORIDE (PF) 0.9 % IJ SOLN
INTRAMUSCULAR | Status: AC
  Filled 2019-11-26: qty 50

## 2019-11-26 MED ORDER — ALBUTEROL SULFATE (2.5 MG/3ML) 0.083% IN NEBU
2.5000 mg | INHALATION_SOLUTION | Freq: Three times a day (TID) | RESPIRATORY_TRACT | Status: DC
  Administered 2019-11-26 – 2019-11-27 (×2): 2.5 mg via RESPIRATORY_TRACT
  Filled 2019-11-26 (×2): qty 3

## 2019-11-26 MED ORDER — IOHEXOL 350 MG/ML SOLN
100.0000 mL | Freq: Once | INTRAVENOUS | Status: AC | PRN
  Administered 2019-11-26: 100 mL via INTRAVENOUS

## 2019-11-26 MED ORDER — ALBUTEROL SULFATE (2.5 MG/3ML) 0.083% IN NEBU
2.5000 mg | INHALATION_SOLUTION | RESPIRATORY_TRACT | Status: DC | PRN
  Administered 2019-11-26: 2.5 mg via RESPIRATORY_TRACT
  Filled 2019-11-26: qty 3

## 2019-11-26 MED ORDER — SODIUM CHLORIDE 0.9 % IV SOLN: 250.0000 mL | INTRAVENOUS | Status: DC | PRN

## 2019-11-26 MED ORDER — SODIUM CHLORIDE 0.9% FLUSH: 3.0000 mL | Freq: Once | INTRAVENOUS | Status: DC

## 2019-11-26 MED ORDER — MONTELUKAST SODIUM 10 MG PO TABS
10.0000 mg | ORAL_TABLET | Freq: Every day | ORAL | Status: DC
  Administered 2019-11-26 – 2019-11-27 (×2): 10 mg via ORAL
  Filled 2019-11-26 (×2): qty 1

## 2019-11-26 MED ORDER — INSULIN ASPART 100 UNIT/ML ~~LOC~~ SOLN
0.0000 [IU] | Freq: Three times a day (TID) | SUBCUTANEOUS | Status: DC
  Administered 2019-11-27: 2 [IU] via SUBCUTANEOUS
  Administered 2019-11-27: 1 [IU] via SUBCUTANEOUS
  Filled 2019-11-26: qty 0.09

## 2019-11-26 MED ORDER — FLUTICASONE FUROATE-VILANTEROL 200-25 MCG/INH IN AEPB
1.0000 | INHALATION_SPRAY | Freq: Every day | RESPIRATORY_TRACT | Status: DC
  Administered 2019-11-26 – 2019-11-28 (×3): 1 via RESPIRATORY_TRACT
  Filled 2019-11-26 (×2): qty 28

## 2019-11-26 MED ORDER — MAGNESIUM SULFATE 2 GM/50ML IV SOLN
2.0000 g | Freq: Once | INTRAVENOUS | Status: AC
  Administered 2019-11-26: 2 g via INTRAVENOUS
  Filled 2019-11-26: qty 50

## 2019-11-26 MED ORDER — HYDROCHLOROTHIAZIDE 25 MG PO TABS
25.0000 mg | ORAL_TABLET | Freq: Every day | ORAL | Status: DC
  Administered 2019-11-26 – 2019-11-28 (×3): 25 mg via ORAL
  Filled 2019-11-26 (×3): qty 1

## 2019-11-26 MED ORDER — ONDANSETRON HCL 4 MG/2ML IJ SOLN: 4.0000 mg | Freq: Four times a day (QID) | INTRAMUSCULAR | Status: DC | PRN

## 2019-11-26 MED ORDER — POLYETHYLENE GLYCOL 3350 17 G PO PACK: 17.0000 g | PACK | Freq: Every day | ORAL | Status: DC | PRN

## 2019-11-26 MED ORDER — ATORVASTATIN CALCIUM 10 MG PO TABS
10.0000 mg | ORAL_TABLET | Freq: Every day | ORAL | Status: DC
  Administered 2019-11-26 – 2019-11-28 (×3): 10 mg via ORAL
  Filled 2019-11-26 (×3): qty 1

## 2019-11-26 MED ORDER — UMECLIDINIUM BROMIDE 62.5 MCG/INH IN AEPB
1.0000 | INHALATION_SPRAY | Freq: Every day | RESPIRATORY_TRACT | Status: DC
  Administered 2019-11-26 – 2019-11-28 (×3): 1 via RESPIRATORY_TRACT
  Filled 2019-11-26 (×2): qty 7

## 2019-11-26 MED ORDER — ACETAMINOPHEN 650 MG RE SUPP: 650.0000 mg | Freq: Four times a day (QID) | RECTAL | Status: DC | PRN

## 2019-11-26 MED ORDER — SODIUM CHLORIDE 0.9% FLUSH
3.0000 mL | Freq: Two times a day (BID) | INTRAVENOUS | Status: DC
  Administered 2019-11-26 – 2019-11-27 (×3): 3 mL via INTRAVENOUS

## 2019-11-26 MED ORDER — SODIUM CHLORIDE 0.9% FLUSH: 3.0000 mL | INTRAVENOUS | Status: DC | PRN

## 2019-11-26 MED ORDER — METHYLPREDNISOLONE SODIUM SUCC 125 MG IJ SOLR
60.0000 mg | Freq: Four times a day (QID) | INTRAMUSCULAR | Status: DC
  Administered 2019-11-26 – 2019-11-27 (×4): 60 mg via INTRAVENOUS
  Filled 2019-11-26 (×4): qty 2

## 2019-11-26 MED ORDER — LOSARTAN POTASSIUM 50 MG PO TABS
100.0000 mg | ORAL_TABLET | Freq: Every day | ORAL | Status: DC
  Administered 2019-11-26 – 2019-11-28 (×3): 100 mg via ORAL
  Filled 2019-11-26 (×3): qty 2

## 2019-11-26 MED ORDER — DOXYCYCLINE HYCLATE 100 MG PO TABS
100.0000 mg | ORAL_TABLET | Freq: Two times a day (BID) | ORAL | Status: DC
  Administered 2019-11-26 – 2019-11-28 (×5): 100 mg via ORAL
  Filled 2019-11-26 (×6): qty 1

## 2019-11-26 MED ORDER — ONDANSETRON HCL 4 MG PO TABS: 4.0000 mg | ORAL_TABLET | Freq: Four times a day (QID) | ORAL | Status: DC | PRN

## 2019-11-26 MED ORDER — ENOXAPARIN SODIUM 80 MG/0.8ML ~~LOC~~ SOLN
70.0000 mg | SUBCUTANEOUS | Status: DC
  Administered 2019-11-26 – 2019-11-27 (×2): 70 mg via SUBCUTANEOUS
  Filled 2019-11-26: qty 0.8
  Filled 2019-11-26: qty 0.7

## 2019-11-26 MED ORDER — HYDROCODONE-ACETAMINOPHEN 5-325 MG PO TABS
1.0000 | ORAL_TABLET | ORAL | Status: DC | PRN
  Administered 2019-11-26 – 2019-11-27 (×2): 2 via ORAL
  Filled 2019-11-26 (×3): qty 2

## 2019-11-26 MED ORDER — ASPIRIN EC 81 MG PO TBEC
81.0000 mg | DELAYED_RELEASE_TABLET | Freq: Every day | ORAL | Status: DC
  Administered 2019-11-26 – 2019-11-28 (×3): 81 mg via ORAL
  Filled 2019-11-26 (×3): qty 1

## 2019-11-26 MED ORDER — ACETAMINOPHEN 325 MG PO TABS: 650.0000 mg | ORAL_TABLET | Freq: Four times a day (QID) | ORAL | Status: DC | PRN

## 2019-11-26 MED ORDER — CYCLOBENZAPRINE HCL 10 MG PO TABS: 10.0000 mg | ORAL_TABLET | Freq: Two times a day (BID) | ORAL | Status: DC | PRN

## 2019-11-26 MED ORDER — SODIUM CHLORIDE 0.9% FLUSH
3.0000 mL | Freq: Two times a day (BID) | INTRAVENOUS | Status: DC
  Administered 2019-11-26 – 2019-11-27 (×4): 3 mL via INTRAVENOUS

## 2019-11-26 MED ORDER — LOSARTAN POTASSIUM-HCTZ 100-25 MG PO TABS: 1.0000 | ORAL_TABLET | Freq: Every day | ORAL | Status: DC

## 2019-11-26 MED ORDER — ZOLPIDEM TARTRATE 5 MG PO TABS
5.0000 mg | ORAL_TABLET | Freq: Every evening | ORAL | Status: DC | PRN
  Administered 2019-11-26 – 2019-11-27 (×2): 5 mg via ORAL
  Filled 2019-11-26 (×2): qty 1

## 2019-11-26 NOTE — ED Triage Notes (Signed)
Pt endorses SOB, midline chest discomfort. Denies cough. Recently evaluated by her PCP and cardiologist for the same.

## 2019-11-26 NOTE — H&P (Signed)
History and Physical    Edla Para XAJ:[id number] DOB: [birth year] DOA: 11/26/2019  PCP: Loretha Brasil, FNP   Patient coming from: Home   Chief Complaint: SOB, chest pain   HPI: Patty Howard is a 64 y.o. female with medical history significant for OSA on CPAP, severe persistent asthma, BMI 43, and hypertension, now presenting to the emergency department for evaluation of shortness of breath and chest pain.  Patient reports that she developed increased shortness of breath and chest pain approximately 2 months ago and has had notable worsening in her symptoms since.  Symptoms began with increase in her chronic exertional dyspnea that have slowly worsened to the point where she now becomes dyspneic with talking.  The chest discomfort is localized to the left side, intermittent, sometimes worse with deep breath, occurring both at rest and with activity.  She has seen her allergist and pulmonologist during the course of this illness and was scheduled to see cardiology but missed the appointment and it was rescheduled for 12/01/2019.  She had chest CT performed without contrast on 11/02/2019 without any acute cardiopulmonary findings.  She denies fevers or chills.  She has not been coughing much.  She reports a long history of orthopnea that is unchanged.  She has chronic mild ankle swelling bilaterally without any recent change.  She has not found much relief with her inhalers nebulizers at home and reports continued worsening despite prednisone.  ED Course: Upon arrival to the ED, patient is found to be afebrile, saturating low 90s on room air, tachypneic to the 30s, tachycardic to the 120s, and with stable blood pressure.  EKG features sinus tachycardia with rate 123.  Chest x-ray is negative for acute cardiopulmonary disease.  Chemistry panel notable for glucose of 194.  CBC unremarkable.  Troponin is normal and BNP also normal.  Covid PCR screening test is in process.  Patient was treated  with IV magnesium in the ED second troponin is pending.  Review of Systems:  All other systems reviewed and apart from HPI, are negative.  Past Medical History:  Diagnosis Date  . Anxiety   . Arthritis   . COPD (chronic obstructive pulmonary disease) ([place])   . Fracture    closed displaced left radial head  . Hypertension   . Sleep apnea    does not wear CPAP    Past Surgical History:  Procedure Laterality Date  . BACK SURGERY     spinal stimulator  . COLONOSCOPY W/ BIOPSIES AND POLYPECTOMY    . DILATION AND CURETTAGE OF UTERUS    . LAPAROSCOPIC ROUX-EN-Y GASTRIC BYPASS WITH UPPER ENDOSCOPY AND REMOVAL OF LAP BAND    . RADIAL HEAD ARTHROPLASTY Left 02/03/2019   Procedure: LEFT RADIAL HEAD ARTHROPLASTY;  Surgeon: [name], MD;  Location: [facility];  Service: Orthopedics;  Laterality: Left;  AXILLARY BLOCK VS BIER BLOCK  . TUBAL LIGATION       reports that she quit smoking about 21 years ago. Her smoking use included cigarettes. She has never used smokeless tobacco. She reports that she does not drink alcohol and does not use drugs.  Allergies  Allergen Reactions  . Banana Hives and Itching    Family History  Problem Relation Age of Onset  . Hypertension Mother   . Stroke Mother   . Cancer Father      Prior to Admission medications   Medication Sig Start Date End Date Taking? Authorizing Provider  albuterol (VENTOLIN HFA) 108 (90 Base) MCG/ACT inhaler  INHALE 1-2 PUFFS INTO THE LUNGS EVERY 6 (SIX) HOURS AS NEEDED FOR WHEEZING OR SHORTNESS OF BREATH. Patient taking differently: Inhale 1-2 puffs into the lungs every 6 (six) hours as needed for wheezing or shortness of breath.  11/02/19  Yes Valentina Shaggy, MD  aspirin EC 81 MG tablet Take 81 mg by mouth daily. Swallow whole.   Yes [provider]  atorvastatin (LIPITOR) 10 MG tablet Take 10 mg by mouth daily.   Yes [provider]  cyclobenzaprine (FLEXERIL) 10 MG tablet Take 10 mg by mouth 2 (two)  times daily as needed for muscle spasms.  08/09/19  Yes [provider]  fluticasone furoate-vilanterol (BREO ELLIPTA) 200-25 MCG/INH AEPB Inhale 1 puff into the lungs daily.   Yes [provider]  formoterol (PERFOROMIST) 20 MCG/2ML nebulizer solution Take 2 mLs (20 mcg total) by nebulization 2 (two) times daily. 10/31/19  Yes Valentina Shaggy, MD  INCRUSE ELLIPTA 62.5 MCG/INH AEPB Inhale 1 puff into the lungs daily. 09/05/19  Yes [provider]  ipratropium-albuterol (DUONEB) 0.5-2.5 (3) MG/3ML SOLN Inhale 3 mLs into the lungs every 6 (six) hours as needed. Patient taking differently: Inhale 3 mLs into the lungs every 6 (six) hours as needed (wheezing).  08/04/19  Yes Valentina Shaggy, MD  levocetirizine (XYZAL) 5 MG tablet Take 1 tablet (5 mg total) by mouth every evening. 02/07/19  Yes Valentina Shaggy, MD  losartan-hydrochlorothiazide (HYZAAR) 100-25 MG tablet Take 1 tablet by mouth daily.   Yes [provider]  montelukast (SINGULAIR) 10 MG tablet Take 10 mg by mouth at bedtime.   Yes [provider]  Multiple Vitamin (MULTIVITAMIN WITH MINERALS) TABS tablet Take 1 tablet by mouth daily. Centrum Multivitamin   Yes [provider]  naproxen sodium (ALEVE) 220 MG tablet Take 660 mg by mouth 2 (two) times daily as needed (pain).   Yes [provider]  predniSONE (DELTASONE) 20 MG tablet Take 20 mg by mouth 2 (two) times daily. 11/03/19  Yes [provider]  Vitamin D, Ergocalciferol, (DRISDOL) 1.25 MG (50000 UNIT) CAPS capsule Take 50,000 Units by mouth every Monday.  10/27/19  Yes [provider]  zolpidem (AMBIEN) 10 MG tablet Take 10 mg by mouth at bedtime as needed for sleep.   Yes [provider]  budesonide (PULMICORT) 0.5 MG/2ML nebulizer solution Take 2 mLs (0.5 mg total) by nebulization 2 (two) times daily. Patient not taking: Reported on 11/26/2019 10/31/19   Valentina Shaggy, MD   fluticasone [facility]) 50 MCG/ACT nasal spray Place 2 sprays into both nostrils daily as needed for allergies. Patient not taking: Reported on 11/26/2019 04/27/19   Valentina Shaggy, MD  revefenacin [facility]) 175 MCG/3ML nebulizer solution Inhale 3 mLs (175 mcg total) into the lungs daily. Patient not taking: Reported on 11/26/2019 10/31/19   Valentina Shaggy, MD  pantoprazole (PROTONIX) 40 MG tablet Take 1 tablet (40 mg total) by mouth daily. Patient not taking: Reported on 05/21/2019 01/13/19 06/04/19  Valentina Shaggy, MD    Physical Exam: Vitals:   11/26/19 3845 11/26/19 0413 11/26/19 0414 11/26/19 0415  BP:      Pulse: 79  78 77  Resp: (!) 22 16 19 18   Temp:      TempSrc:      SpO2: 98%  98% 99%    Constitutional: NAD, calm  Eyes: PERTLA, lids and conjunctivae normal ENMT: Mucous membranes are moist. Posterior pharynx clear of any exudate or lesions.  Neck: normal, supple, no masses, no thyromegaly Respiratory: Prolonged expiratory phase, expiratory wheezing. Dyspneic with speech. No pallor or cyanosis.  Cardiovascular: S1 & S2 heard, regular rate and rhythm. Mild lower leg edema bilaterally. Abdomen: No distension, no tenderness, soft. Bowel sounds active.  Musculoskeletal: no clubbing / cyanosis. No joint deformity upper and lower extremities.   Skin: no significant rashes, lesions, ulcers. Warm, dry, well-perfused. Neurologic: CN 2-12 grossly intact. Sensation intact. Moving all extremities.  Psychiatric: Alert and oriented to person, place, and situation. Very pleasant and cooperative.    Labs and Imaging on Admission: I have personally reviewed following labs and imaging studies  CBC: Recent Labs  Lab 11/26/19 0155  WBC 8.3  HGB 10.9*  HCT 36.1  MCV 87.8  PLT 937   Basic Metabolic Panel: Recent Labs  Lab 11/26/19 0155  NA 141  K 5.0  CL 107  CO2 24  GLUCOSE 194*  BUN 21  CREATININE 0.86  CALCIUM 8.9   GFR: CrCl cannot be calculated  (Unknown ideal weight.). Liver Function Tests: No results for input(s): AST, ALT, ALKPHOS, BILITOT, PROT, ALBUMIN in the last 168 hours. No results for input(s): LIPASE, AMYLASE in the last 168 hours. No results for input(s): AMMONIA in the last 168 hours. Coagulation Profile: No results for input(s): INR, PROTIME in the last 168 hours. Cardiac Enzymes: No results for input(s): CKTOTAL, CKMB, CKMBINDEX, TROPONINI in the last 168 hours. BNP (last 3 results) No results for input(s): PROBNP in the last 8760 hours. HbA1C: No results for input(s): HGBA1C in the last 72 hours. CBG: No results for input(s): GLUCAP in the last 168 hours. Lipid Profile: No results for input(s): CHOL, HDL, LDLCALC, TRIG, CHOLHDL, LDLDIRECT in the last 72 hours. Thyroid Function Tests: No results for input(s): TSH, T4TOTAL, FREET4, T3FREE, THYROIDAB in the last 72 hours. Anemia Panel: No results for input(s): VITAMINB12, FOLATE, FERRITIN, TIBC, IRON, RETICCTPCT in the last 72 hours. Urine analysis:    Component Value Date/Time   COLORURINE YELLOW 10/15/2018 1948   APPEARANCEUR HAZY (A) 10/15/2018 1948   LABSPEC 1.028 10/15/2018 1948   PHURINE 5.0 10/15/2018 1948   GLUCOSEU NEGATIVE 10/15/2018 1948   HGBUR NEGATIVE 10/15/2018 [place] NEGATIVE 10/15/2018 1948   KETONESUR 5 (A) 10/15/2018 1948   PROTEINUR NEGATIVE 10/15/2018 1948   NITRITE NEGATIVE 10/15/2018 1948   LEUKOCYTESUR MODERATE (A) 10/15/2018 1948   Sepsis Labs: @LABRCNTIP (procalcitonin:4,lacticidven:4) )No results found for this or any previous visit (from the past 240 hour(s)).   Radiological Exams on Admission: DG Chest 2 View  Result Date: 11/26/2019 CLINICAL DATA:  Chest pain, shortness of breath EXAM: CHEST - 2 VIEW COMPARISON:  10/27/2019 FINDINGS: Heart and mediastinal contours are within normal limits. No focal opacities or effusions. No acute bony abnormality. IMPRESSION: No active cardiopulmonary disease. Electronically  Signed   By: Rolm Baptise M.D.   On: 11/26/2019 00:56    EKG: Independently reviewed. Sinus tachycardia (rate 123), borderline repolarization abnormality   Assessment/Plan   1. Chest pain, SOB   - Presents with 2 months of DOE and chest pain, found to be tachycardic and tachypneic without hypoxia or evidence for DVT, initial troponin is normal, EKG with sinus tachycardia but no acute ischemic features, and no acute findings on CXR  - Continue cardiac monitoring, check 2nd troponin and d-dimer, check echocardiogram, and continue ASA and Lipitor    2. Acute asthma exacerbation  - Presents with 2 months of DOE and chest pain, found to be wheezing  and dyspneic with speech  - Check sputum culture, increase systemic steroids with IV Solu-Medrol, continue ICS/LABA/LAMA, continue Singulair, as-needed albuterol   3. OSA  - Patient reports compliance with CPAP at home, will continue qHS    4. Hyperglycemia  - Serum glucose is 194 on admission  - Check A1c, check CBGs, use low-intensity SSI if needed    5. Hypertension  - BP at goa, continue losartan-HCTZ    DVT prophylaxis: Lovenox  Code Status: Full  Family Communication: Discussed with patient  Disposition Plan:  Patient is from: Home  Anticipated d/c is to: Home  Anticipated d/c date is: 11/27/19 Patient currently: Pending further eval of chest pain and SOB with second troponin, d-dimer, and echocardiogram  Consults called: none  Admission status: Observation     Vianne Bulls, MD [facility] Pager: See [email or web address]  If 7AM-7PM, please contact the daytime attending [email or web address]  11/26/2019, 4:24 AM

## 2019-11-26 NOTE — Progress Notes (Signed)
Patient seen and examined personally, I reviewed the chart, history and physical and admission note, done by admitting physician this morning and agree with the same with following addendum.  Please refer to the morning admission note for more detailed plan of care.  Briefly  64 year old female with OSA on CPAP, severe persistent asthma, morbidly obese BMI 43, hypertension presented to the ED with shortness of breath and chest pain started 2 months ago and worsening since.  Has chronic exertional dyspnea and have slowly worsened to the point that she becomes dyspneic with talking.  Discomfort on the left side somewhat worse with deep breath at rest and with activity.  Had seen an allergist and pulmonologist during the course of the illness and was planning to see cardiology but missed appointment and due to see in 6/18.  She had CT chest performed without contrast on 5/20 no acute cardiopulmonary findings.  No fever at home.  Has chronic mild ankle swelling bilaterally with a recent change.  In the ED,afebrile saturating low 90s on room air tachypneic in 30s tachycardic in 120s, BP stable.  COVID-19 negative, labs shows normal troponin and BNP chest x-ray negative. Patient was given IV magnesium started on IV steroids and admitted.  D-dimer was drawn D dimer came + at 1.0 This morning Heart rate stable in 70s to 90s, saturating 94 to 98% on room air, blood pressure 140s to 160s. Troponin 13-13 BNP 51 On exam  Issues addressed  Chest pain/shortness of breath/dyspnea on exertion ongoing for 2 months: Has had extensive work-up, eval by pulmonary, allergy specialist cardio. Troponin negative D-dimer positive.  Unclear etiology, suspect acute asthma exacerbation, rule out VTE due to positive D-dimer, with tachycardia tachypnea pleuritic component. Discussed risks/benefits including contrast nephropathy and she would like to proceed. On outpatient-she was planning to have echo and CTA Chest done based on  cardio recs -was seen by cardio on last friday. Will also obtain TTE.  Acute asthma exacerbation-placed on IV steroid here. Has been on prednisone 20 mg x2 wks. Patient is on ICS/LABA/LAMA and Singulair and bronchodilator. cotn same, has exp wheezing. Smoked for 10 yrs ( half PPD). She sees Corporate treasurer.  Cough with discolored phlegm,?bronchitis- added po doxycycline x 3 days.  Hyperglycemia Hypertension OSA on CPAP Morbid obesity w/ BMI 43- wt loss and pcp referral.

## 2019-11-26 NOTE — ED Notes (Signed)
Patient has a hx of asthma. PCP put her on 20 mg of prednisone for a month. Patient states md is running test but still do not know what is wrong with her. Patient states she has been having shortness of breath when walking or doing any type of activity. Even when she talks she gets short of breathe. Patient states she has inhalers at home and a nebulizer. She also goes to a Pulmonologist and allergy dr.

## 2019-11-26 NOTE — Progress Notes (Signed)
Echocardiogram 2D Echocardiogram has been performed.  Oneal Deputy Brianca Fortenberry 11/26/2019, 3:29 PM

## 2019-11-26 NOTE — ED Provider Notes (Signed)
[facility] DEPT Provider Note: Georgena Spurling, MD, FACEP  CSN: [id number] MRN: [id number] ARRIVAL: 11/26/19 at [place]: WA09/[id number]   CHIEF COMPLAINT  Shortness of Breath   HISTORY OF PRESENT ILLNESS  11/26/19 2:08 AM Patty Howard is a 64 y.o. female with a history of COPD.  She is here with about a 1 week history of worsening shortness of breath.  She is short of breath even at rest and with any exertion such as just walking across the room it becomes severe.  It has been associated with a burning left upper chest pain that comes on when she exerts herself.  She rates his pain is a 3 out of 10.  She has been using her albuterol inhaler and nebulizer frequently without relief.  She is on prednisone 20 mg daily.  She has recently seen her PCP and a cardiologist.  The cardiologist and schedule her for an echocardiogram in July.  Although she denied a cough to triage she has had a rattly cough in my presence.  She has not had a fever.   Past Medical History:  Diagnosis Date  . Anxiety   . Arthritis   . COPD (chronic obstructive pulmonary disease) ([place])   . Fracture    closed displaced left radial head  . Hypertension   . Sleep apnea    does not wear CPAP    Past Surgical History:  Procedure Laterality Date  . BACK SURGERY     spinal stimulator  . COLONOSCOPY W/ BIOPSIES AND POLYPECTOMY    . DILATION AND CURETTAGE OF UTERUS    . LAPAROSCOPIC ROUX-EN-Y GASTRIC BYPASS WITH UPPER ENDOSCOPY AND REMOVAL OF LAP BAND    . RADIAL HEAD ARTHROPLASTY Left 02/03/2019   Procedure: LEFT RADIAL HEAD ARTHROPLASTY;  Surgeon: [name], MD;  Location: [facility];  Service: Orthopedics;  Laterality: Left;  AXILLARY BLOCK VS BIER BLOCK  . TUBAL LIGATION      Family History  Problem Relation Age of Onset  . Hypertension Mother   . Stroke Mother   . Cancer Father     Social History   Tobacco Use  . Smoking status: Former Smoker    Types: Cigarettes    Quit date: 06/15/1998     Years since quitting: 21.4  . Smokeless tobacco: Never Used  Vaping Use  . Vaping Use: Never used  Substance Use Topics  . Alcohol use: Never  . Drug use: Never    Prior to Admission medications   Medication Sig Start Date End Date Taking? Authorizing Provider  acetaminophen (TYLENOL) 500 MG tablet Take 1,000 mg by mouth every 6 (six) hours as needed for moderate pain.    [provider]  albuterol (VENTOLIN HFA) 108 (90 Base) MCG/ACT inhaler INHALE 1-2 PUFFS INTO THE LUNGS EVERY 6 (SIX) HOURS AS NEEDED FOR WHEEZING OR SHORTNESS OF BREATH. 11/02/19   Valentina Shaggy, MD  budesonide (PULMICORT) 0.5 MG/2ML nebulizer solution Take 2 mLs (0.5 mg total) by nebulization 2 (two) times daily. 10/31/19   Valentina Shaggy, MD  Cholecalciferol (VITAMIN D3 PO) Take 1 tablet by mouth daily.     [provider]  cyclobenzaprine (FLEXERIL) 10 MG tablet Take 10 mg by mouth 2 (two) times daily as needed. 08/09/19   [provider]  fluticasone (FLONASE) 50 MCG/ACT nasal spray Place 2 sprays into both nostrils daily as needed for allergies. 04/27/19   Valentina Shaggy, MD  formoterol (PERFOROMIST) 20 MCG/2ML nebulizer solution Take 2  mLs (20 mcg total) by nebulization 2 (two) times daily. 10/31/19   Valentina Shaggy, MD  INCRUSE ELLIPTA 62.5 MCG/INH AEPB Inhale 1 puff into the lungs daily. 09/05/19   [provider]  ipratropium-albuterol (DUONEB) 0.5-2.5 (3) MG/3ML SOLN Inhale 3 mLs into the lungs every 6 (six) hours as needed. 08/04/19   Valentina Shaggy, MD  levocetirizine Harlow Ohms) 5 MG tablet Take 1 tablet (5 mg total) by mouth every evening. 02/07/19   Valentina Shaggy, MD  losartan-hydrochlorothiazide (HYZAAR) 100-25 MG tablet Take 1 tablet by mouth daily.    [provider]  Multiple Vitamin (MULTIVITAMIN WITH MINERALS) TABS tablet Take 1 tablet by mouth daily. Centrum Multivitamin    [provider]  revefenacin (YUPELRI) 175  MCG/3ML nebulizer solution Inhale 3 mLs (175 mcg total) into the lungs daily. 10/31/19   Valentina Shaggy, MD  Vitamin D, Ergocalciferol, (DRISDOL) 1.25 MG (50000 UNIT) CAPS capsule Take 50,000 Units by mouth once a week. 10/27/19   [provider]  pantoprazole (PROTONIX) 40 MG tablet Take 1 tablet (40 mg total) by mouth daily. Patient not taking: Reported on 05/21/2019 01/13/19 06/04/19  Valentina Shaggy, MD    Allergies Banana   REVIEW OF SYSTEMS  Negative except as noted here or in the History of Present Illness.   PHYSICAL EXAMINATION  Initial Vital Signs Blood pressure (!) 189/93, pulse (!) 118, temperature 98.2 F (36.8 C), temperature source Oral, resp. rate (!) 22, SpO2 99 %.  Examination General: Well-developed, well-nourished female in no acute distress; appearance consistent with age of record HENT: normocephalic; atraumatic Eyes: pupils equal, round and reactive to light; extraocular muscles intact Neck: supple Heart: regular rate and rhythm; tachycardia Lungs: Distant sounds with mild rhonchi in right base; rattly cough; tachypnea; accessory muscle use Abdomen: soft; nondistended; nontender; bowel sounds present Extremities: No deformity; full range of motion; pulses normal Neurologic: Awake, alert and oriented; motor function intact in all extremities and symmetric; no facial droop Skin: Warm and dry Psychiatric: Normal mood and affect   RESULTS  Summary of this visit's results, reviewed and interpreted by myself:   EKG Interpretation  Date/Time:  Sunday November 26 2019 00:33:21 EDT Ventricular Rate:  123 PR Interval:    QRS Duration: 93 QT Interval:  330 QTC Calculation: 472 R Axis:   83 Text Interpretation: Sinus tachycardia Borderline right axis deviation Borderline repolarization abnormality Baseline wander in lead(s) II III aVF V4 Confirmed by Lashonne Shull [phone]) on 11/26/2019 12:35:14 AM      Laboratory Studies: Results for orders  placed or performed during the hospital encounter of 11/26/19 (from the past 24 hour(s))  Basic metabolic panel     Status: Abnormal   Collection Time: 11/26/19  1:55 AM  Result Value Ref Range   Sodium 141 135 - 145 mmol/L   Potassium 5.0 3.5 - 5.1 mmol/L   Chloride 107 98 - 111 mmol/L   CO2 24 22 - 32 mmol/L   Glucose, Bld 194 (H) 70 - 99 mg/dL   BUN 21 8 - 23 mg/dL   Creatinine, Ser 0.86 0.44 - 1.00 mg/dL   Calcium 8.9 8.9 - 10.3 mg/dL   GFR calc non Af Amer >60 >60 mL/min   GFR calc Af Amer >60 >60 mL/min   Anion gap 10 5 - 15  CBC     Status: Abnormal   Collection Time: 11/26/19  1:55 AM  Result Value Ref Range   WBC 8.3 4.0 - 10.5 K/uL  RBC 4.11 3.87 - 5.11 MIL/uL   Hemoglobin 10.9 (L) 12.0 - 15.0 g/dL   HCT 36.1 36 - 46 %   MCV 87.8 80.0 - 100.0 fL   MCH 26.5 26.0 - 34.0 pg   MCHC 30.2 30.0 - 36.0 g/dL   RDW 15.9 (H) 11.5 - 15.5 %   Platelets 210 150 - 400 K/uL   nRBC 0.0 0.0 - 0.2 %  Troponin I (High Sensitivity)     Status: None   Collection Time: 11/26/19  1:55 AM  Result Value Ref Range   Troponin I (High Sensitivity) 13 <18 ng/L  Brain natriuretic peptide     Status: None   Collection Time: 11/26/19  1:55 AM  Result Value Ref Range   B Natriuretic Peptide 51.3 0.0 - 100.0 pg/mL   Imaging Studies: DG Chest 2 View  Result Date: 11/26/2019 CLINICAL DATA:  Chest pain, shortness of breath EXAM: CHEST - 2 VIEW COMPARISON:  10/27/2019 FINDINGS: Heart and mediastinal contours are within normal limits. No focal opacities or effusions. No acute bony abnormality. IMPRESSION: No active cardiopulmonary disease. Electronically Signed   By: Rolm Baptise M.D.   On: 11/26/2019 00:56    ED COURSE and MDM  Nursing notes, initial and subsequent vitals signs, including pulse oximetry, reviewed and interpreted by myself.  Vitals:   11/26/19 0328 11/26/19 0329 11/26/19 0330 11/26/19 0331  BP:   (!) 146/77   Pulse: 95 85 83 89  Resp: (!) 23 18 15 17   Temp:      TempSrc:       SpO2: 97% 96% 98% 96%   Medications  magnesium sulfate IVPB 2 g 50 mL (has no administration in time range)    We will have the patient admitted as she is dyspneic to the point of not being able to perform activities of daily living.  PROCEDURES  Procedures   ED DIAGNOSES     ICD-10-CM   1. COPD exacerbation ([place])  J44.1   2. Chest pain on exertion  R07.9        Kewon Statler, Jenny Reichmann, MD 11/26/19 [phone]

## 2019-11-26 NOTE — ED Notes (Signed)
Unable to transport patient to room d/t assisting MD with a procedure in another roomn NT unable at this time. This RN to transport patient now.

## 2019-11-26 NOTE — ED Notes (Signed)
Patient given breakfast tray.

## 2019-11-27 ENCOUNTER — Inpatient Hospital Stay: Payer: Medicare Other | Admitting: Pulmonary Disease

## 2019-11-27 DIAGNOSIS — R079 Chest pain, unspecified: Secondary | ICD-10-CM | POA: Diagnosis not present

## 2019-11-27 LAB — BASIC METABOLIC PANEL
Anion gap: 9 (ref 5–15)
BUN: 15 mg/dL (ref 8–23)
CO2: 27 mmol/L (ref 22–32)
Calcium: 9 mg/dL (ref 8.9–10.3)
Chloride: 102 mmol/L (ref 98–111)
Creatinine, Ser: 0.69 mg/dL (ref 0.44–1.00)
GFR calc Af Amer: >60 mL/min (ref >60)
GFR calc non Af Amer: >60 mL/min (ref >60)
Glucose, Bld: 158 mg/dL — ABNORMAL HIGH (ref 70–99)
Potassium: 4.4 mmol/L (ref 3.5–5.1)
Sodium: 138 mmol/L (ref 135–145)

## 2019-11-27 LAB — EXPECTORATED SPUTUM ASSESSMENT W GRAM STAIN, RFLX TO RESP C

## 2019-11-27 LAB — BRAIN NATRIURETIC PEPTIDE: B Natriuretic Peptide: 51.6 pg/mL (ref 0.0–100.0)

## 2019-11-27 LAB — CBC
HCT: 35 % — ABNORMAL LOW (ref 36.0–46.0)
Hemoglobin: 10.7 g/dL — ABNORMAL LOW (ref 12.0–15.0)
MCH: 26.2 pg (ref 26.0–34.0)
MCHC: 30.6 g/dL (ref 30.0–36.0)
MCV: 85.8 fL (ref 80.0–100.0)
Platelets: 281 10*3/uL (ref 150–400)
RBC: 4.08 MIL/uL (ref 3.87–5.11)
RDW: 15.8 % — ABNORMAL HIGH (ref 11.5–15.5)
WBC: 9.8 10*3/uL (ref 4.0–10.5)
nRBC: 0 % (ref 0.0–0.2)

## 2019-11-27 LAB — GLUCOSE, CAPILLARY
Glucose-Capillary: 153 mg/dL — ABNORMAL HIGH (ref 70–99)
Glucose-Capillary: 111 mg/dL — ABNORMAL HIGH (ref 70–99)
Glucose-Capillary: 121 mg/dL — ABNORMAL HIGH (ref 70–99)
Glucose-Capillary: 171 mg/dL — ABNORMAL HIGH (ref 70–99)

## 2019-11-27 MED ORDER — IPRATROPIUM-ALBUTEROL 0.5-2.5 (3) MG/3ML IN SOLN
3.0000 mL | Freq: Three times a day (TID) | RESPIRATORY_TRACT | Status: DC
  Administered 2019-11-27 – 2019-11-28 (×3): 3 mL via RESPIRATORY_TRACT
  Filled 2019-11-27 (×3): qty 3

## 2019-11-27 MED ORDER — FUROSEMIDE 40 MG PO TABS
40.0000 mg | ORAL_TABLET | Freq: Every day | ORAL | Status: DC
  Administered 2019-11-27 – 2019-11-28 (×2): 40 mg via ORAL
  Filled 2019-11-27 (×2): qty 1

## 2019-11-27 MED ORDER — ARFORMOTEROL TARTRATE 15 MCG/2ML IN NEBU
15.0000 ug | INHALATION_SOLUTION | Freq: Two times a day (BID) | RESPIRATORY_TRACT | Status: DC
  Administered 2019-11-27 – 2019-11-28 (×2): 15 ug via RESPIRATORY_TRACT
  Filled 2019-11-27 (×2): qty 2

## 2019-11-27 MED ORDER — BUDESONIDE 0.5 MG/2ML IN SUSP
0.5000 mg | Freq: Two times a day (BID) | RESPIRATORY_TRACT | Status: DC
  Administered 2019-11-27 – 2019-11-28 (×2): 0.5 mg via RESPIRATORY_TRACT
  Filled 2019-11-27 (×2): qty 2

## 2019-11-27 MED ORDER — LOSARTAN POTASSIUM-HCTZ 100-25 MG PO TABS: 1.0000 | ORAL_TABLET | Freq: Every day | ORAL | Status: DC

## 2019-11-27 NOTE — Progress Notes (Signed)
PROGRESS NOTE    Patty Howard  ZOX:[id number] DOB: 1955/08/06 DOA: 11/26/2019 PCP: Loretha Brasil, FNP    Chief Complaint  Patient presents with  . Shortness of Breath    Brief Narrative:  20 black female community dwelling resident OSA on CPAP follows with Dr. Janelle Floor office [place] [facility], severe persistent asthma (spirometry 11/03/2019), BMI 43, HTN Patient states she gained over 30 pounds since the beginning of the coronavirus pandemic and this has made herHave more difficulty doing physical activity despite going to the gym regularly She is using her CPAP regularly she states She came to the emergency room 6/13 with subacute worsening over the past month and a half-she states that when exercising she becomes more winded and starts sweating a lot BNP 51troponin neg EKG neg, CT chest negative for PE  Assessment & Plan:   Principal Problem:   Chest pain Active Problems:   Prediabetes   Hypertension, essential, benign   Acute asthma exacerbation   Sleep apnea   1. mMRC dyspnea grade 2-3-secondary to restrictive issue secondary to obesity + OSA 2. 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 Continue Breo Ellipta 200-25, formoterol 2 mils twice daily, Xyzal 5 mg every afternoon, Benralizumab 30  In addition to an acute asthma exacerbation b. She is severely deconditioned and she has mild wheezing c. I think she might have had a asthma attack but given her severe symptoms we will keep her 1 more day transition to oral prednisone --continue doxycycline every 12 for 3 days e d. She will need ongoing weight loss and may be referral to a bariatric surgeon versus the weight loss clinic associated with [facility]-I will ask case management to look into this 3. Chest pain a. Troponin is negative EKG is benign b. Echocardiogram EF 81-19% grade 1 diastolic dysfunction c. She has mild lower extremity swelling although her BNP is low-her obesity may not reflect her actual volume  status d. I will start her on Lasix daily and see how she does 4. HTN a. Losartan HCTZ , Blood pressure is only moderately controlled b. Add a calcium channel blocker in a.m. if still not controlled 5. Hyperglycemia  Secondary likely to steroids-monitor trends   DVT prophylaxis:  Code Status:  Family Communication: Disposition:   Status is: Observation  The patient will require care spanning > 2 midnights and should be moved to inpatient because: Unsafe d/c plan  Dispo: The patient is from: Home              Anticipated d/c is to: Home              Anticipated d/c date is: 1 day              Patient currently is not medically stable to d/c.       Consultants:   None currently  Procedures: None  Antimicrobials: Doxycycline   Subjective: was up and walking but felt quite weak and diaphoretic without chest pain This seems to have been Her presentation over the past several weeks No chest pain fever chills sputum nausea or vomiting  Objective: Vitals:   11/27/19 0538 11/27/19 0737 11/27/19 0957 11/27/19 1247  BP: (!) 155/80  (!) 167/94 (!) 171/91  Pulse: (!) 58  93 72  Resp: 16  20 19   Temp: (!) 97.5 F (36.4 C)   98.7 F (37.1 C)  TempSrc: Oral   Oral  SpO2: 93% 95% 97% 97%  Weight:      Height:  Intake/Output Summary (Last 24 hours) at 11/27/2019 1340 Last data filed at 11/27/2019 0931 Gross per 24 hour  Intake 570 ml  Output --  Net 570 ml   Filed Weights   11/26/19 0655  Weight: 136.1 kg    Examination:  Awake coherent no distress looks comfortable Mallampati 4 Chest clear except upon the right posterior sid with decreased air entry Abdomen soft no rebound no guarding  trace lower extremity edema Neurologically intact without focal deficit   Data Reviewed: I have personally reviewed following labs and imaging studies Sugar 158 BNP 51  Radiology Studies: DG Chest 2 View  Result Date: 11/26/2019 CLINICAL DATA:  Chest pain, shortness  of breath EXAM: CHEST - 2 VIEW COMPARISON:  10/27/2019 FINDINGS: Heart and mediastinal contours are within normal limits. No focal opacities or effusions. No acute bony abnormality. IMPRESSION: No active cardiopulmonary disease. Electronically Signed   By: Rolm Baptise M.D.   On: 11/26/2019 00:56   CT ANGIO CHEST PE W OR WO CONTRAST  Result Date: 11/26/2019 CLINICAL DATA:  Shortness of breath and chest pain with elevated D-dimer EXAM: CT ANGIOGRAPHY CHEST WITH CONTRAST TECHNIQUE: Multidetector CT imaging of the chest was performed using the standard protocol during bolus administration of intravenous contrast. Multiplanar CT image reconstructions and MIPs were obtained to evaluate the vascular anatomy. CONTRAST:  156m OMNIPAQUE IOHEXOL 350 MG/ML SOLN COMPARISON:  Chest radiograph November 26, 2019; chest CT Nov 02, 2019 FINDINGS: Cardiovascular: There is no demonstrable pulmonary embolus. There is no thoracic aortic aneurysm or dissection. The visualized great vessels appear unremarkable. There is aortic atherosclerosis. No pericardial effusion or pericardial thickening is evident. Mediastinum/Nodes: Visualized thyroid appears unremarkable. There is no appreciable thoracic adenopathy. There is a small hiatal hernia. Lungs/Pleura: There is mild bibasilar atelectasis. There is no evident edema or airspace opacity. Scarring in the left upper lobe posteriorly is stable. There is a 4 mm nodular opacity in the anterior segment right upper lobe seen on axial slice 53 series 10. There is a 6 x 4 mm nodular opacity abutting the pleura in the posterior segment of the left upper lobe, stable, seen on axial slice 49 series 10. There is a 2 mm nodular opacity in the anterior segment of the left upper lobe toward the apex, stable, seen on axial slice 24 series 10. No new nodular opacities are appreciable compared to recent prior study. Upper Abdomen: There is a lap band at the gastric cardia. Visualized upper abdominal  structures otherwise appear unremarkable. Musculoskeletal: No blastic or lytic bone lesions. No chest wall lesions evident. Review of the MIP images confirms the above findings. IMPRESSION: 1. No demonstrable pulmonary embolus. No thoracic aortic aneurysm or dissection. There are foci of aortic atherosclerosis. 2. Areas of mild scarring and atelectasis. Scattered nodular opacities, largest measuring 5 mm. No follow-up needed if patient is low-risk (and has no known or suspected primary neoplasm). Non-contrast chest CT can be considered in 12 months if patient is high-risk. This recommendation follows the consensus statement: Guidelines for Management of Incidental Pulmonary Nodules Detected on CT Images: From the Fleischner Society 2017; Radiology 2017; 284:228-243. No edema or airspace opacity. No pleural effusions. 3.  No evident adenopathy. 4.  Small hiatal hernia. 5.  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 Aortic Atherosclerosis (ICD10-I70.0). Electronically Signed   By: WLowella GripIII M.D.   On: 11/26/2019 09:36   ECHOCARDIOGRAM COMPLETE  Result Date: 11/26/2019    ECHOCARDIOGRAM REPORT   Patient Name:   Patty GRIPPIDate  of Exam: 11/26/2019 Medical Rec #:  [id number]            Height:       70.0 in Accession #:    [id number]           Weight:       300.0 lb Date of Birth:  March 20, 1956            BSA:          2.480 m Patient Age:    64 years             BP:           141/89 mmHg Patient Gender: F                    HR:           77 bpm. Exam Location:  Inpatient Procedure: 2D Echo, Color Doppler and Cardiac Doppler Indications:    R07.9* Chest pain, unspecified  History:        Patient has no prior history of Echocardiogram examinations.                 COPD; Risk Factors:Hypertension and Sleep Apnea.  Sonographer:    Raquel Sarna Senior RDCS Referring Phys: [id number] [place] IMPRESSIONS  1. Left ventricular ejection fraction, by estimation, is 65 to 70%. The left ventricle has normal function. The left  ventricle has no regional wall motion abnormalities. There is mild left ventricular hypertrophy. Left ventricular diastolic parameters are consistent with Grade I diastolic dysfunction (impaired relaxation).  2. Right ventricular systolic function is normal. The right ventricular size is normal.  3. The mitral valve is normal in structure. No evidence of mitral valve regurgitation.  4. The aortic valve is grossly normal. Aortic valve regurgitation is not visualized. FINDINGS  Left Ventricle: Left ventricular ejection fraction, by estimation, is 65 to 70%. The left ventricle has normal function. The left ventricle has no regional wall motion abnormalities. The left ventricular internal cavity size was normal in size. There is  mild left ventricular hypertrophy. Left ventricular diastolic parameters are consistent with Grade I diastolic dysfunction (impaired relaxation). Right Ventricle: The right ventricular size is normal. No increase in right ventricular wall thickness. Right ventricular systolic function is normal. Left Atrium: Left atrial size was normal in size. Right Atrium: Right atrial size was normal in size. Pericardium: There is no evidence of pericardial effusion. Mitral Valve: The mitral valve is normal in structure. No evidence of mitral valve regurgitation. Tricuspid Valve: The tricuspid valve is normal in structure. Tricuspid valve regurgitation is trivial. Aortic Valve: The aortic valve is grossly normal. Aortic valve regurgitation is not visualized. Pulmonic Valve: The pulmonic valve was not well visualized. Pulmonic valve regurgitation is not visualized. Aorta: The aortic root and ascending aorta are structurally normal, with no evidence of dilitation. IAS/Shunts: The interatrial septum was not assessed.  LEFT VENTRICLE PLAX 2D LVIDd:         3.90 cm  Diastology LVIDs:         2.40 cm  LV e' lateral:   6.20 cm/s LV PW:         1.40 cm  LV E/e' lateral: 10.2 LV IVS:        1.40 cm  LV e' medial:     5.66 cm/s LVOT diam:     2.00 cm  LV E/e' medial:  11.2 LV SV:         72 LV SV Index:  29 LVOT Area:     3.14 cm  RIGHT VENTRICLE RV S prime:     10.30 cm/s TAPSE (M-mode): 2.6 cm LEFT ATRIUM             Index       RIGHT ATRIUM           Index LA diam:        4.00 cm 1.61 cm/m  RA Area:     17.90 cm LA Vol (A2C):   61.8 ml 24.92 ml/m RA Volume:   51.50 ml  20.77 ml/m LA Vol (A4C):   28.3 ml 11.41 ml/m LA Biplane Vol: 43.9 ml 17.70 ml/m  AORTIC VALVE LVOT Vmax:   111.00 cm/s LVOT Vmean:  80.000 cm/s LVOT VTI:    0.230 m  AORTA Ao Root diam: 3.20 cm Ao Asc diam:  2.80 cm MITRAL VALVE MV Area (PHT): 2.62 cm    SHUNTS MV Decel Time: 289 msec    Systemic VTI:  0.23 m MV E velocity: 63.40 cm/s  Systemic Diam: 2.00 cm MV A velocity: 70.30 cm/s MV E/A ratio:  0.90 Dorris Carnes MD Electronically signed by Dorris Carnes MD Signature Date/Time: 11/26/2019/5:09:16 PM    Final       Scheduled Meds: . aspirin EC  81 mg Oral Daily  . atorvastatin  10 mg Oral Daily  . doxycycline  100 mg Oral Q12H  . enoxaparin (LOVENOX) injection  70 mg Subcutaneous Q24H  . fluticasone furoate-vilanterol  1 puff Inhalation Daily  . furosemide  40 mg Oral Daily  . losartan  100 mg Oral Daily   And  . hydrochlorothiazide  25 mg Oral Daily  . insulin aspart  0-9 Units Subcutaneous TID WC  . ipratropium-albuterol  3 mL Nebulization TID  . montelukast  10 mg Oral QHS  . sodium chloride flush  3 mL Intravenous Q12H  . sodium chloride flush  3 mL Intravenous Q12H  . umeclidinium bromide  1 puff Inhalation Daily   Continuous Infusions: . sodium chloride       LOS: 0 days    Time spent: 50    Nita Sells, MD [facility]   To contact the attending provider between 7A-7P or the covering provider during after hours 7P-7A, please log into the web site [email or web address] and access using universal [place] password for that web site. If you do not have the password, please call the hospital  operator.  11/27/2019, 1:40 PM

## 2019-11-27 NOTE — Discharge Instructions (Signed)
COPD and Physical Activity Chronic obstructive pulmonary disease (COPD) is a long-term (chronic) condition that affects the lungs. COPD is a general term that can be used to describe many different lung problems that cause lung swelling (inflammation) and limit airflow, including chronic bronchitis and emphysema. The main symptom of COPD is shortness of breath, which makes it harder to do even simple tasks. This can also make it harder to exercise and be active. Talk with your health care provider about treatments to help you breathe better and actions you can take to prevent breathing problems during physical activity. What are the benefits of exercising with COPD? Exercising regularly is an important part of a healthy lifestyle. You can still exercise and do physical activities even though you have COPD. Exercise and physical activity improve your shortness of breath by increasing blood flow (circulation). This causes your heart to pump more oxygen through your body. Moderate exercise can improve your:  Oxygen use.  Energy level.  Shortness of breath.  Strength in your breathing muscles.  Heart health.  Sleep.  Self-esteem and feelings of self-worth.  Depression, stress, and anxiety levels. Exercise can benefit everyone with COPD. The severity of your disease may affect how hard you can exercise, especially at first, but everyone can benefit. Talk with your health care provider about how much exercise is safe for you, and which activities and exercises are safe for you. What actions can I take to prevent breathing problems during physical activity?  Sign up for a pulmonary rehabilitation program. This type of program may include: ? Education about lung diseases. ? Exercise classes that teach you how to exercise and be more active while improving your breathing. This usually involves:  Exercise using your lower extremities, such as a stationary bicycle.  About 30 minutes of exercise, 2  to 5 times per week, for 6 to 12 weeks  Strength training, such as push ups or leg lifts. ? Nutrition education. ? Group classes in which you can talk with others who also have COPD and learn ways to manage stress.  If you use an oxygen tank, you should use it while you exercise. Work with your health care provider to adjust your oxygen for your physical activity. Your resting flow rate is different from your flow rate during physical activity.  While you are exercising: ? Take slow breaths. ? Pace yourself and do not try to go too fast. ? Purse your lips while breathing out. Pursing your lips is similar to a kissing or whistling position. ? If doing exercise that uses a quick burst of effort, such as weight lifting:  Breathe in before starting the exercise.  Breathe out during the hardest part of the exercise (such as raising the weights). Where to find support You can find support for exercising with COPD from:  Your health care provider.  A pulmonary rehabilitation program.  Your local health department or community health programs.  Support groups, online or in-person. Your health care provider may be able to recommend support groups. Where to find more information You can find more information about exercising with COPD from:  American Lung Association: [employer]Insider.se.  COPD Foundation: https://www.rivera.net/. Contact a health care provider if:  Your symptoms get worse.  You have chest pain.  You have nausea.  You have a fever.  You have trouble talking or catching your breath.  You want to start a new exercise program or a new activity. Summary  COPD is a general term that can  be used to describe many different lung problems that cause lung swelling (inflammation) and limit airflow. This includes chronic bronchitis and emphysema.  Exercise and physical activity improve your shortness of breath by increasing blood flow (circulation). This causes your heart to provide more  oxygen to your body.  Contact your health care provider before starting any exercise program or new activity. Ask your health care provider what exercises and activities are safe for you. This information is not intended to replace advice given to you by your health care provider. Make sure you discuss any questions you have with your health care provider. Document Revised: 09/21/2018 Document Reviewed: 06/24/2017 Elsevier Patient Education  2020 [place].   Chronic Obstructive Pulmonary Disease  Chronic obstructive pulmonary disease (COPD) is a long-term (chronic) condition that affects the lungs. COPD is a general term that can be used to describe many different lung problems that cause lung swelling (inflammation) and limit airflow, including chronic bronchitis and emphysema. If you have COPD, your lung function will probably never return to normal. In most cases, it gets worse over time. However, there are steps you can take to slow the progression of the disease and improve your quality of life. What are the causes? This condition may be caused by:  Smoking. This is the most common cause.  Certain genes passed down through families. What increases the risk? The following factors may make you more likely to develop this condition:  Secondhand smoke from cigarettes, pipes, or cigars.  Exposure to chemicals and other irritants such as fumes and dust in the work environment.  Chronic lung conditions or infections. What are the signs or symptoms? Symptoms of this condition include:  Shortness of breath, especially during physical activity.  Chronic cough with a large amount of thick mucus. Sometimes the cough may not have any mucus (dry cough).  Wheezing.  Rapid breaths.  Gray or bluish discoloration (cyanosis) of the skin, especially in your fingers, toes, or lips.  Feeling tired (fatigue).  Weight loss.  Chest tightness.  Frequent infections.  Episodes when  breathing symptoms become much worse (exacerbations).  Swelling in the ankles, feet, or legs. This may occur in later stages of the disease. How is this diagnosed? This condition is diagnosed based on:  Your medical history.  A physical exam. You may also have tests, including:  Lung (pulmonary) function tests. This may include a spirometry test, which measures your ability to exhale properly.  Chest X-ray.  CT scan.  Blood tests. How is this treated? This condition may be treated with:  Medicines. These may include inhaled rescue medicines to treat acute exacerbations as well as long-term, or maintenance, medicines to prevent flare-ups of COPD. ? Bronchodilators help treat COPD by dilating the airways to allow increased airflow and make your breathing more comfortable. ? Steroids can reduce airway inflammation and help prevent exacerbations.  Smoking cessation. If you smoke, your health care provider may ask you to quit, and may also recommend therapy or replacement products to help you quit.  Pulmonary rehabilitation. This may involve working with a team of health care providers and specialists, such as respiratory, occupational, and physical therapists.  Exercise and physical activity. These are beneficial for nearly all people with COPD.  Nutrition therapy to gain weight, if you are underweight.  Oxygen. Supplemental oxygen therapy is only helpful if you have a low oxygen level in your blood (hypoxemia).  Lung surgery or transplant.  Palliative care. This is to help people with  COPD feel comfortable when treatment is no longer working. Follow these instructions at home: Medicines  Take over-the-counter and prescription medicines (inhaled or pills) only as told by your health care provider.  Talk to your health care provider before taking any cough or allergy medicines. You may need to avoid certain medicines that dry out your airways. Lifestyle  If you are a smoker,  the most important thing that you can do is to stop smoking. Do not use any products that contain nicotine or tobacco, such as cigarettes and e-cigarettes. If you need help quitting, ask your health care provider. Continuing to smoke will cause the disease to progress faster.  Avoid exposure to things that irritate your lungs, such as smoke, chemicals, and fumes.  Stay active, but balance activity with periods of rest. Exercise and physical activity will help you maintain your ability to do things you want to do.  Learn and use relaxation techniques to manage stress and to control your breathing.  Get the right amount of sleep and get quality sleep. Most adults need 7 or more hours per night.  Eat healthy foods. Eating smaller, more frequent meals and resting before meals may help you maintain your strength. Controlled breathing Learn and use controlled breathing techniques as directed by your health care provider. Controlled breathing techniques include:  Pursed lip breathing. Start by breathing in (inhaling) through your nose for 1 second. Then, purse your lips as if you were going to whistle and breathe out (exhale) through the pursed lips for 2 seconds.  Diaphragmatic breathing. Start by putting one hand on your abdomen just above your waist. Inhale slowly through your nose. The hand on your abdomen should move out. Then purse your lips and exhale slowly. You should be able to feel the hand on your abdomen moving in as you exhale. Controlled coughing Learn and use controlled coughing to clear mucus from your lungs. Controlled coughing is a series of short, progressive coughs. The steps of controlled coughing are: 1. Lean your head slightly forward. 2. Breathe in deeply using diaphragmatic breathing. 3. Try to hold your breath for 3 seconds. 4. Keep your mouth slightly open while coughing twice. 5. Spit any mucus out into a tissue. 6. Rest and repeat the steps once or twice as  needed. General instructions  Make sure you receive all the vaccines that your health care provider recommends, especially the pneumococcal and influenza vaccines. Preventing infection and hospitalization is very important when you have COPD.  Use oxygen therapy and pulmonary rehabilitation if directed to by your health care provider. If you require home oxygen therapy, ask your health care provider whether you should purchase a pulse oximeter to measure your oxygen level at home.  Work with your health care provider to develop a COPD action plan. This will help you know what steps to take if your condition gets worse.  Keep other chronic health conditions under control as told by your health care provider.  Avoid extreme temperature and humidity changes.  Avoid contact with people who have an illness that spreads from person to person (is contagious), such as viral infections or pneumonia.  Keep all follow-up visits as told by your health care provider. This is important. Contact a health care provider if:  You are coughing up more mucus than usual.  There is a change in the color or thickness of your mucus.  Your breathing is more labored than usual.  Your breathing is faster than usual.  You have  difficulty sleeping.  You need to use your rescue medicines or inhalers more often than expected.  You have trouble doing routine activities such as getting dressed or walking around the house. Get help right away if:  You have shortness of breath while you are resting.  You have shortness of breath that prevents you from: ? Being able to talk. ? Performing your usual physical activities.  You have chest pain lasting longer than 5 minutes.  Your skin color is more blue (cyanotic) than usual.  You measure low oxygen saturations for longer than 5 minutes with a pulse oximeter.  You have a fever.  You feel too tired to breathe normally. Summary  Chronic obstructive pulmonary  disease (COPD) is a long-term (chronic) condition that affects the lungs.  Your lung function will probably never return to normal. In most cases, it gets worse over time. However, there are steps you can take to slow the progression of the disease and improve your quality of life.  Treatment for COPD may include taking medicines, quitting smoking, pulmonary rehabilitation, and changes to diet and exercise. As the disease progresses, you may need oxygen therapy, a lung transplant, or palliative care.  To help manage your condition, do not smoke, avoid exposure to things that irritate your lungs, stay up to date on all vaccines, and follow your health care provider's instructions for taking medicines. This information is not intended to replace advice given to you by your health care provider. Make sure you discuss any questions you have with your health care provider. Document Revised: 05/14/2017 Document Reviewed: 07/06/2016 Elsevier Patient Education  [place].  Heart Failure, Diagnosis  Heart failure means that your heart is not able to pump blood in the right way. This makes it hard for your body to work well. Heart failure is usually a long-term (chronic) condition. You must take good care of yourself and follow your treatment plan from your doctor. What are the causes? This condition may be caused by:  High blood pressure.  Build up of cholesterol and fat in the arteries.  Heart attack. This injures the heart muscle.  Heart valves that do not open and close properly.  Damage of the heart muscle. This is also called cardiomyopathy.  Lung disease.  Abnormal heart rhythms. What increases the risk? The risk of heart failure goes up as a person ages. This condition is also more likely to develop in people who:  Are overweight.  Are female.  Smoke or chew tobacco.  Abuse alcohol or illegal drugs.  Have taken medicines that can damage the heart.  Have  diabetes.  Have abnormal heart rhythms.  Have thyroid problems.  Have low blood counts (anemia). What are the signs or symptoms? Symptoms of this condition include:  Shortness of breath.  Coughing.  Swelling of the feet, ankles, legs, or belly.  Losing weight for no reason.  Trouble breathing.  Waking from sleep because of the need to sit up and get more air.  Rapid heartbeat.  Being very tired.  Feeling dizzy, or feeling like you may pass out (faint).  Having no desire to eat.  Feeling like you may vomit (nauseous).  Peeing (urinating) more at night.  Feeling confused. How is this treated?     This condition may be treated with:  Medicines. These can be given to treat blood pressure and to make the heart muscles stronger.  Changes in your daily life. These may include eating a healthy diet, staying  at a healthy body weight, quitting tobacco and illegal drug use, or doing exercises.  Surgery. Surgery can be done to open blocked valves, or to put devices in the heart, such as pacemakers.  A donor heart (heart transplant). You will receive a healthy heart from a donor. Follow these instructions at home:  Treat other conditions as told by your doctor. These may include high blood pressure, diabetes, thyroid disease, or abnormal heart rhythms.  Learn as much as you can about heart failure.  Get support as you need it.  Keep all follow-up visits as told by your doctor. This is important. Summary  Heart failure means that your heart is not able to pump blood in the right way.  This condition is caused by high blood pressure, heart attack, or damage of the heart muscle.  Symptoms of this condition include shortness of breath and swelling of the feet, ankles, legs, or belly. You may also feel very tired or feel like you may vomit.  You may be treated with medicines, surgery, or changes in your daily life.  Treat other health conditions as told by your  doctor. This information is not intended to replace advice given to you by your health care provider. Make sure you discuss any questions you have with your health care provider. Document Revised: 08/19/2018 Document Reviewed: 08/19/2018 Elsevier Patient Education  [place].

## 2019-11-28 DIAGNOSIS — R079 Chest pain, unspecified: Secondary | ICD-10-CM | POA: Diagnosis not present

## 2019-11-28 LAB — BASIC METABOLIC PANEL
Anion gap: 11 (ref 5–15)
BUN: 22 mg/dL (ref 8–23)
CO2: 28 mmol/L (ref 22–32)
Calcium: 8.9 mg/dL (ref 8.9–10.3)
Chloride: 99 mmol/L (ref 98–111)
Creatinine, Ser: 0.89 mg/dL (ref 0.44–1.00)
GFR calc Af Amer: >60 mL/min (ref >60)
GFR calc non Af Amer: >60 mL/min (ref >60)
Glucose, Bld: 135 mg/dL — ABNORMAL HIGH (ref 70–99)
Potassium: 3.9 mmol/L (ref 3.5–5.1)
Sodium: 138 mmol/L (ref 135–145)

## 2019-11-28 LAB — CBC
HCT: 35 % — ABNORMAL LOW (ref 36.0–46.0)
Hemoglobin: 10.7 g/dL — ABNORMAL LOW (ref 12.0–15.0)
MCH: 26.1 pg (ref 26.0–34.0)
MCHC: 30.6 g/dL (ref 30.0–36.0)
MCV: 85.4 fL (ref 80.0–100.0)
Platelets: 289 10*3/uL (ref 150–400)
RBC: 4.1 MIL/uL (ref 3.87–5.11)
RDW: 15.7 % — ABNORMAL HIGH (ref 11.5–15.5)
WBC: 13 10*3/uL — ABNORMAL HIGH (ref 4.0–10.5)
nRBC: 0 % (ref 0.0–0.2)

## 2019-11-28 LAB — GLUCOSE, CAPILLARY: Glucose-Capillary: 91 mg/dL (ref 70–99)

## 2019-11-28 MED ORDER — PREDNISONE 20 MG PO TABS: ORAL_TABLET | ORAL | 0 refills | Status: AC

## 2019-11-28 MED ORDER — DOXYCYCLINE HYCLATE 100 MG PO TABS: 100.0000 mg | ORAL_TABLET | Freq: Two times a day (BID) | ORAL | 0 refills | Status: DC

## 2019-11-28 MED ORDER — BREO ELLIPTA 200-25 MCG/INH IN AEPB: 1.0000 | INHALATION_SPRAY | Freq: Every day | RESPIRATORY_TRACT | 11 refills | Status: DC

## 2019-11-28 MED ORDER — FUROSEMIDE 40 MG PO TABS: 40.0000 mg | ORAL_TABLET | ORAL | 0 refills | Status: DC

## 2019-11-28 NOTE — Progress Notes (Signed)
Patient ambulated 100 feet. Oxygen saturation on room air, sustained 96%.

## 2019-11-28 NOTE — Discharge Summary (Signed)
Physician Discharge Summary  Patty Howard RCV:[id number] DOB: Oct 23, 1955 DOA: 11/26/2019  PCP: Loretha Brasil, FNP  Admit date: 11/26/2019 Discharge date: 11/28/2019  Time spent: 55 minutes  Recommendations for Outpatient Follow-up:  1. Requires outpatient follow-up with pulmonology/primary care 2. May require PFT titration and/or CPAP adjustment in the outpatient setting-Dr. Otilio Miu on this 3. Will need to keep her appointment with cardiology for possible right-sided heart failure-not emergently may need cardiac catheterization with regards to elevated right heart pressures which she probably has because of her morbid obesity 4. Consider referral to medically supervised weight loss and or bariatric surgeon as this may help with with certain things in terms of her weight management and chronic disease management  Discharge Diagnoses:  Principal Problem:   Chest pain Active Problems:   Prediabetes   Hypertension, essential, benign   Acute asthma exacerbation   Sleep apnea   Discharge Condition: Improved and doing well  Diet recommendation: Heart healthy low-salt  Filed Weights   11/26/19 0655  Weight: 136.1 kg    History of present illness:  74 black female community dwelling resident OSA on CPAP follows with Dr. Janelle Floor office [facility], severe persistent asthma (spirometry 11/03/2019), BMI 43, HTN Patient states she gained over 30 pounds since the beginning of the coronavirus pandemic and this has made herHave more difficulty doing physical activity despite going to the gym regularly She is using her CPAP regularly she states She came to the emergency room 6/13 with subacute worsening over the past month and a half-she states that when exercising she becomes more winded and starts sweating a lot BNP 51troponin neg EKG neg, CT chest negative for PE  Hospital Course:  1. mMRC dyspnea grade 2-3-secondary to restrictive issue secondary to obesity + OSA 2. Moderate  to severe asthma overlap a. Continue Breo Ellipta 200-25, formoterol 2 mils twice daily, Xyzal 5 mg every afternoon, Benralizumab 30  In addition to an acute asthma exacerbation b. She is severely deconditioned and she has mild wheezing c. Patient was on IV Solu-Medrol for several days and then transitioned to oral prednisone on discharge d. Doxycycline for several more days in the outpatient setting e. She will need ongoing weight loss and may be referral to a bariatric surgeon versus the weight loss clinic associated with [facility]-I will ask case management to look into this 3. Chest pain a. Troponin is negative EKG is benign b. Echocardiogram EF 10-25% grade 1 diastolic dysfunction c. She has mild lower extremity swelling although her BNP is low-her obesity may not reflect her actual volume status 4. HTN, diastolic heart failure a. Losartan HCTZ , Blood pressure is only moderately controlled b. Add a calcium channel blocker in a.m. if still not controlled c. Being discharged on Lasix q. other day limited supply of meds until she can follow-up with PCP and/or cardiology with regards to further planning d. She may require right heart cath although this is probably right heart failure secondary to her morbid obesity 5. Hyperglycemia , leukocytosis Secondary likely to steroids-monitor trends  Procedures:  Echo  Consultations:  None  Discharge Exam: Vitals:   11/28/19 1000 11/28/19 1003  BP: 138/65 139/81  Pulse: (!) 120 (!) 122  Resp:    Temp:    SpO2: 95% 96%    General: Awake alert coherent no distress Cardiovascular: S1-S2 no murmur telemetry is within normal limits Respiratory: Clear no rales no rhonchi no wheeze Abdomen soft obese nontender Lower extremity edema is improved  Discharge  Instructions   Discharge Instructions    Diet - low sodium heart healthy   Complete by: As directed    Discharge instructions   Complete by: As directed    Please make sure that  you take the Lasix every other day until you follow-up with either your primary physician and/or your pulmonologist As we discussed this hospital stay, you have many things going on in your chest cavity You have sleep apnea which can cause high pressures in your heart these high pressures in your heart in combination with--- some obesity can cause you to have difficulty with your heart pumping effectively leading to is a term called diastolic heart failure         Please read the handouts that you were given to better understand this and follow-up with your primary care physician You also have COPD/asthma which can cause difficulty with breathing        For this we have given you a taper of prednisone and a specialized dose and we will finish up the doxycycline and ensure that you get further follow-up You will need probably an x-ray and repeat PFTs (pulmonary function testing) and may be a referral to a bariatric surgeon if you are interested in medically supervised weight loss Best of luck take care of yourself continue the efforts at weight loss as this is the one thing that will help you with helping resolve most of your chronic conditions   Increase activity slowly   Complete by: As directed      Allergies as of 11/28/2019      Reactions   Banana Hives, Itching      Medication List    STOP taking these medications   budesonide 0.5 MG/2ML nebulizer solution Commonly known as: Pulmicort   naproxen sodium 220 MG tablet Commonly known as: ALEVE   Perforomist 20 MCG/2ML nebulizer solution Generic drug: formoterol   Yupelri 175 MCG/3ML nebulizer solution Generic drug: revefenacin     TAKE these medications   albuterol 108 (90 Base) MCG/ACT inhaler Commonly known as: VENTOLIN HFA INHALE 1-2 PUFFS INTO THE LUNGS EVERY 6 (SIX) HOURS AS NEEDED FOR WHEEZING OR SHORTNESS OF BREATH. What changed:   how much to take  how to take this  when to take this  reasons to take  this  additional instructions   aspirin EC 81 MG tablet Take 81 mg by mouth daily. Swallow whole.   atorvastatin 10 MG tablet Commonly known as: LIPITOR Take 10 mg by mouth daily.   Breo Ellipta 200-25 MCG/INH Aepb Generic drug: fluticasone furoate-vilanterol Inhale 1 puff into the lungs daily. What changed: when to take this   cyclobenzaprine 10 MG tablet Commonly known as: FLEXERIL Take 10 mg by mouth 2 (two) times daily as needed for muscle spasms.   doxycycline 100 MG tablet Commonly known as: VIBRA-TABS Take 1 tablet (100 mg total) by mouth every 12 (twelve) hours.   fluticasone 50 MCG/ACT nasal spray Commonly known as: FLONASE Place 2 sprays into both nostrils daily as needed for allergies.   furosemide 40 MG tablet Commonly known as: LASIX Take 1 tablet (40 mg total) by mouth every other day. Need to follow up with PCP/Cardiology to discuss further needs of this medicine in the OP setting   Incruse Ellipta 62.5 MCG/INH Aepb Generic drug: umeclidinium bromide Inhale 1 puff into the lungs daily.   ipratropium-albuterol 0.5-2.5 (3) MG/3ML Soln Commonly known as: DUONEB Inhale 3 mLs into the lungs every 6 (six)  hours as needed. What changed: reasons to take this   levocetirizine 5 MG tablet Commonly known as: XYZAL Take 1 tablet (5 mg total) by mouth every evening.   losartan-hydrochlorothiazide 100-25 MG tablet Commonly known as: HYZAAR Take 1 tablet by mouth daily.   montelukast 10 MG tablet Commonly known as: SINGULAIR Take 10 mg by mouth at bedtime.   multivitamin with minerals Tabs tablet Take 1 tablet by mouth daily. Centrum Multivitamin   predniSONE 20 MG tablet Commonly known as: DELTASONE Take 1 tablet (20 mg total) by mouth 2 (two) times daily with a meal for 3 days, THEN 1 tablet (20 mg total) daily with breakfast for 3 days, THEN 0.5 tablets (10 mg total) daily with breakfast for 3 days. Start taking on: November 28, 2019 What changed: See the  new instructions.   Vitamin D (Ergocalciferol) 1.25 MG (50000 UNIT) Caps capsule Commonly known as: DRISDOL Take 50,000 Units by mouth every Monday.   zolpidem 10 MG tablet Commonly known as: AMBIEN Take 10 mg by mouth at bedtime as needed for sleep.      Allergies  Allergen Reactions  . Banana Hives and Itching      The results of significant diagnostics from this hospitalization (including imaging, microbiology, ancillary and laboratory) are listed below for reference.    Significant Diagnostic Studies: DG Chest 2 View  Result Date: 11/26/2019 CLINICAL DATA:  Chest pain, shortness of breath EXAM: CHEST - 2 VIEW COMPARISON:  10/27/2019 FINDINGS: Heart and mediastinal contours are within normal limits. No focal opacities or effusions. No acute bony abnormality. IMPRESSION: No active cardiopulmonary disease. Electronically Signed   By: Rolm Baptise M.D.   On: 11/26/2019 00:56   CT CHEST WO CONTRAST  Result Date: 11/02/2019 CLINICAL DATA:  Shortness of breath and cough. EXAM: CT CHEST WITHOUT CONTRAST TECHNIQUE: Multidetector CT imaging of the chest was performed following the standard protocol without IV contrast. COMPARISON:  None. FINDINGS: Cardiovascular: There is mild calcification of the aortic arch. Normal heart size. No pericardial effusion. Mediastinum/Nodes: No enlarged mediastinal or axillary lymph nodes. Thyroid gland, trachea, and esophagus demonstrate no significant findings. Lungs/Pleura: Mild areas of scarring and/or atelectasis are seen within the posterior aspect of the left upper lobe and posterior aspect of the bilateral lung bases. There is no evidence of a pleural effusion or pneumothorax. Upper Abdomen: There is evidence of prior gastric banding surgery. Musculoskeletal: No chest wall mass or suspicious bone lesions identified. IMPRESSION: 1. Mild left upper lobe and bibasilar scarring and/or atelectasis. 2. Evidence of prior gastric banding surgery. 3. Aortic  atherosclerosis. Aortic Atherosclerosis (ICD10-I70.0). Electronically Signed   By: Virgina Norfolk M.D.   On: 11/02/2019 16:20   CT ANGIO CHEST PE W OR WO CONTRAST  Result Date: 11/26/2019 CLINICAL DATA:  Shortness of breath and chest pain with elevated D-dimer EXAM: CT ANGIOGRAPHY CHEST WITH CONTRAST TECHNIQUE: Multidetector CT imaging of the chest was performed using the standard protocol during bolus administration of intravenous contrast. Multiplanar CT image reconstructions and MIPs were obtained to evaluate the vascular anatomy. CONTRAST:  126m OMNIPAQUE IOHEXOL 350 MG/ML SOLN COMPARISON:  Chest radiograph November 26, 2019; chest CT Nov 02, 2019 FINDINGS: Cardiovascular: There is no demonstrable pulmonary embolus. There is no thoracic aortic aneurysm or dissection. The visualized great vessels appear unremarkable. There is aortic atherosclerosis. No pericardial effusion or pericardial thickening is evident. Mediastinum/Nodes: Visualized thyroid appears unremarkable. There is no appreciable thoracic adenopathy. There is a small hiatal hernia. Lungs/Pleura: There is mild  bibasilar atelectasis. There is no evident edema or airspace opacity. Scarring in the left upper lobe posteriorly is stable. There is a 4 mm nodular opacity in the anterior segment right upper lobe seen on axial slice 53 series 10. There is a 6 x 4 mm nodular opacity abutting the pleura in the posterior segment of the left upper lobe, stable, seen on axial slice 49 series 10. There is a 2 mm nodular opacity in the anterior segment of the left upper lobe toward the apex, stable, seen on axial slice 24 series 10. No new nodular opacities are appreciable compared to recent prior study. Upper Abdomen: There is a lap band at the gastric cardia. Visualized upper abdominal structures otherwise appear unremarkable. Musculoskeletal: No blastic or lytic bone lesions. No chest wall lesions evident. Review of the MIP images confirms the above findings.  IMPRESSION: 1. No demonstrable pulmonary embolus. No thoracic aortic aneurysm or dissection. There are foci of aortic atherosclerosis. 2. Areas of mild scarring and atelectasis. Scattered nodular opacities, largest measuring 5 mm. No follow-up needed if patient is low-risk (and has no known or suspected primary neoplasm). Non-contrast chest CT can be considered in 12 months if patient is high-risk. This recommendation follows the consensus statement: Guidelines for Management of Incidental Pulmonary Nodules Detected on CT Images: From the Fleischner Society 2017; Radiology 2017; 284:228-243. No edema or airspace opacity. No pleural effusions. 3.  No evident adenopathy. 4.  Small hiatal hernia. 5.  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 Aortic Atherosclerosis (ICD10-I70.0). Electronically Signed   By: Lowella Grip III M.D.   On: 11/26/2019 09:36   ECHOCARDIOGRAM COMPLETE  Result Date: 11/26/2019    ECHOCARDIOGRAM REPORT   Patient Name:   Patty Howard Date of Exam: 11/26/2019 Medical Rec #:  [id number]            Height:       70.0 in Accession #:    [id number]           Weight:       300.0 lb Date of Birth:  [birth year]            BSA:          2.480 m Patient Age:    89 years             BP:           141/89 mmHg Patient Gender: F                    HR:           77 bpm. Exam Location:  Inpatient Procedure: 2D Echo, Color Doppler and Cardiac Doppler Indications:    R07.9* Chest pain, unspecified  History:        Patient has no prior history of Echocardiogram examinations.                 COPD; Risk Factors:Hypertension and Sleep Apnea.  Sonographer:    Raquel Sarna Senior RDCS Referring Phys: [id number] [place] IMPRESSIONS  1. Left ventricular ejection fraction, by estimation, is 65 to 70%. The left ventricle has normal function. The left ventricle has no regional wall motion abnormalities. There is mild left ventricular hypertrophy. Left ventricular diastolic parameters are consistent with Grade I diastolic dysfunction  (impaired relaxation).  2. Right ventricular systolic function is normal. The right ventricular size is normal.  3. The mitral valve is normal in structure. No evidence of mitral valve regurgitation.  4. The aortic valve is  grossly normal. Aortic valve regurgitation is not visualized. FINDINGS  Left Ventricle: Left ventricular ejection fraction, by estimation, is 65 to 70%. The left ventricle has normal function. The left ventricle has no regional wall motion abnormalities. The left ventricular internal cavity size was normal in size. There is  mild left ventricular hypertrophy. Left ventricular diastolic parameters are consistent with Grade I diastolic dysfunction (impaired relaxation). Right Ventricle: The right ventricular size is normal. No increase in right ventricular wall thickness. Right ventricular systolic function is normal. Left Atrium: Left atrial size was normal in size. Right Atrium: Right atrial size was normal in size. Pericardium: There is no evidence of pericardial effusion. Mitral Valve: The mitral valve is normal in structure. No evidence of mitral valve regurgitation. Tricuspid Valve: The tricuspid valve is normal in structure. Tricuspid valve regurgitation is trivial. Aortic Valve: The aortic valve is grossly normal. Aortic valve regurgitation is not visualized. Pulmonic Valve: The pulmonic valve was not well visualized. Pulmonic valve regurgitation is not visualized. Aorta: The aortic root and ascending aorta are structurally normal, with no evidence of dilitation. IAS/Shunts: The interatrial septum was not assessed.  LEFT VENTRICLE PLAX 2D LVIDd:         3.90 cm  Diastology LVIDs:         2.40 cm  LV e' lateral:   6.20 cm/s LV PW:         1.40 cm  LV E/e' lateral: 10.2 LV IVS:        1.40 cm  LV e' medial:    5.66 cm/s LVOT diam:     2.00 cm  LV E/e' medial:  11.2 LV SV:         72 LV SV Index:   29 LVOT Area:     3.14 cm  RIGHT VENTRICLE RV S prime:     10.30 cm/s TAPSE (M-mode): 2.6 cm  LEFT ATRIUM             Index       RIGHT ATRIUM           Index LA diam:        4.00 cm 1.61 cm/m  RA Area:     17.90 cm LA Vol (A2C):   61.8 ml 24.92 ml/m RA Volume:   51.50 ml  20.77 ml/m LA Vol (A4C):   28.3 ml 11.41 ml/m LA Biplane Vol: 43.9 ml 17.70 ml/m  AORTIC VALVE LVOT Vmax:   111.00 cm/s LVOT Vmean:  80.000 cm/s LVOT VTI:    0.230 m  AORTA Ao Root diam: 3.20 cm Ao Asc diam:  2.80 cm MITRAL VALVE MV Area (PHT): 2.62 cm    SHUNTS MV Decel Time: 289 msec    Systemic VTI:  0.23 m MV E velocity: 63.40 cm/s  Systemic Diam: 2.00 cm MV A velocity: 70.30 cm/s MV E/A ratio:  0.90 Dorris Carnes MD Electronically signed by Dorris Carnes MD Signature Date/Time: 11/26/2019/5:09:16 PM    Final     Microbiology: Recent Results (from the past 240 hour(s))  SARS Coronavirus 2 by RT PCR (hospital order, performed in [facility] hospital lab) Nasopharyngeal Nasopharyngeal Swab     Status: None   Collection Time: 11/26/19  4:08 AM   Specimen: Nasopharyngeal Swab  Result Value Ref Range Status   SARS Coronavirus 2 NEGATIVE NEGATIVE Final    Comment: (NOTE) SARS-CoV-2 target nucleic acids are NOT DETECTED.  The SARS-CoV-2 RNA is generally detectable in upper and lower respiratory specimens during the acute  phase of infection. The lowest concentration of SARS-CoV-2 viral copies this assay can detect is 250 copies / mL. A negative result does not preclude SARS-CoV-2 infection and should not be used as the sole basis for treatment or other patient management decisions.  A negative result may occur with improper specimen collection / handling, submission of specimen other than nasopharyngeal swab, presence of viral mutation(s) within the areas targeted by this assay, and inadequate number of viral copies (<250 copies / mL). A negative result must be combined with clinical observations, patient history, and epidemiological information.  Fact Sheet for Patients:    StrictlyIdeas.no  Fact Sheet for Healthcare Providers: BankingDealers.co.za  This test is not yet approved or  cleared by the [place] FDA and has been authorized for detection and/or diagnosis of SARS-CoV-2 by FDA under an Emergency Use Authorization (EUA).  This EUA will remain in effect (meaning this test can be used) for the duration of the COVID-19 declaration under Section 564(b)(1) of the Act, 21 U.S.C. section 360bbb-3(b)(1), unless the authorization is terminated or revoked sooner.  Performed at [facility], [place] [address] [place], [place] [address]   Culture, sputum-assessment     Status: None   Collection Time: 11/27/19 10:15 AM   Specimen: Expectorated Sputum  Result Value Ref Range Status   Specimen Description EXPECTORATED SPUTUM  Final   Special Requests NONE  Final   Sputum evaluation   Final    THIS SPECIMEN IS ACCEPTABLE FOR SPUTUM CULTURE Performed at [facility], [place] [address] [place], [place] [address]    Report Status 11/27/2019 FINAL  Final  Culture, respiratory     Status: None (Preliminary result)   Collection Time: 11/27/19 10:15 AM  Result Value Ref Range Status   Specimen Description   Final    EXPECTORATED SPUTUM Performed at [place] [address] [place], [place] [address]    Special Requests   Final    NONE Reflexed from [id number] Performed at [facility], [place] [address] [place], [place] [address]    Gram Stain   Final    MODERATE WBC PRESENT, PREDOMINANTLY PMN MODERATE GRAM POSITIVE COCCI RARE GRAM POSITIVE RODS RARE GRAM NEGATIVE RODS    Culture   Final    CULTURE REINCUBATED FOR BETTER GROWTH Performed at [facility], [place] [address], [place],  [address]    Report Status PENDING  Incomplete     Labs: Basic Metabolic Panel: Recent Labs  Lab 11/26/19 0155 11/27/19 0413  11/28/19 0429  NA 141 138 138  K 5.0 4.4 3.9  CL 107 102 99  CO2 24 27 28   GLUCOSE 194* 158* 135*  BUN 21 15 22   CREATININE 0.86 0.69 0.89  CALCIUM 8.9 9.0 8.9   Liver Function Tests: No results for input(s): AST, ALT, ALKPHOS, BILITOT, PROT, ALBUMIN in the last 168 hours. No results for input(s): LIPASE, AMYLASE in the last 168 hours. No results for input(s): AMMONIA in the last 168 hours. CBC: Recent Labs  Lab 11/26/19 0155 11/27/19 0413 11/28/19 0429  WBC 8.3 9.8 13.0*  HGB 10.9* 10.7* 10.7*  HCT 36.1 35.0* 35.0*  MCV 87.8 85.8 85.4  PLT 210 281 289   Cardiac Enzymes: No results for input(s): CKTOTAL, CKMB, CKMBINDEX, TROPONINI in the last 168 hours. BNP: BNP (last 3 results) Recent Labs    11/26/19 0155 11/27/19 0413  BNP 51.3 51.6    ProBNP (last 3 results) No  results for input(s): PROBNP in the last 8760 hours.  CBG: Recent Labs  Lab 11/27/19 0748 11/27/19 1107 11/27/19 1647 11/27/19 2207 11/28/19 0731  GLUCAP 153* 111* 121* 171* 91       Signed:  Nita Sells MD   [facility] 11/28/2019, 10:08 AM

## 2019-11-28 NOTE — Progress Notes (Addendum)
Patient remains stable. Pt is alert, oriented(x4). Ambulatory without assistance. Discharge instructions reviewed.  Questions, concerns denied.

## 2019-11-29 LAB — CULTURE, RESPIRATORY W GRAM STAIN: Culture: NORMAL

## 2019-11-30 ENCOUNTER — Ambulatory Visit: Payer: Medicare Other | Admitting: Allergy & Immunology

## 2019-11-30 DIAGNOSIS — I700 Atherosclerosis of aorta: Secondary | ICD-10-CM | POA: Insufficient documentation

## 2019-11-30 DIAGNOSIS — Z7189 Other specified counseling: Secondary | ICD-10-CM | POA: Insufficient documentation

## 2019-11-30 HISTORY — DX: Other specified counseling: Z71.89

## 2019-11-30 HISTORY — DX: Atherosclerosis of aorta: I70.0

## 2019-11-30 NOTE — Progress Notes (Signed)
Cardiology Office Note   Date:  12/01/2019   ID:  Patty Howard, DOB 1955-08-31, MRN [id number]  PCP:  Loretha Brasil, FNP  Cardiologist:   No primary care provider on file. Referring:  Loretha Brasil, FNP  Chief Complaint  Patient presents with  . Shortness of Breath      History of Present Illness: Patty Howard is a 64 y.o. female who presents for evaluation of shortness of breath and chest pain. She was just hospitalized on 6/13-6/15. She has a history of asthma and COPD. 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 She presented to the emergency room recently when she developed some increased shortness of breath. There was some chest burning associated with this. She was treated for COPD flare and was hospitalized. I reviewed these records. Chest CT didn't demonstrate fluid and her BNP was normal but she was treated for possible diastolic acute heart failure as well as a COPD flare. She was sent home with a short course of diuretic and with steroids. She feels better. However, she does report that she has been getting some chest discomfort over the last few months. Been slowly getting worse with activities. She thinks her breathing has been a little worse. She is not describing new PND or orthopnea. She is not describing new palpitations, presyncope or syncope. She had some episodes of sweating. This seems to happen when she is short of breath.  CT did demonstrate aortic atherosclerosis.      Past Medical History:  Diagnosis Date  . Anxiety   . Arthritis   . COPD (chronic obstructive pulmonary disease) ([place])   . Fracture    closed displaced left radial head  . Hypertension   . Sleep apnea    does not wear CPAP    Past Surgical History:  Procedure Laterality Date  . BACK SURGERY     spinal stimulator  . COLONOSCOPY W/ BIOPSIES AND POLYPECTOMY    . DILATION AND CURETTAGE OF UTERUS     . LAPAROSCOPIC ROUX-EN-Y GASTRIC BYPASS WITH UPPER ENDOSCOPY AND REMOVAL OF LAP BAND    . RADIAL HEAD ARTHROPLASTY Left 02/03/2019   Procedure: LEFT RADIAL HEAD ARTHROPLASTY;  Surgeon: [name], MD;  Location: [facility];  Service: Orthopedics;  Laterality: Left;  AXILLARY BLOCK VS BIER BLOCK  . TUBAL LIGATION       Current Outpatient Medications  Medication Sig Dispense Refill  . albuterol (VENTOLIN HFA) 108 (90 Base) MCG/ACT inhaler INHALE 1-2 PUFFS INTO THE LUNGS EVERY 6 (SIX) HOURS AS NEEDED FOR WHEEZING OR SHORTNESS OF BREATH. (Patient taking differently: Inhale 1-2 puffs into the lungs every 6 (six) hours as needed for wheezing or shortness of breath. ) 18 g 1  . aspirin EC 81 MG tablet Take 81 mg by mouth daily. Swallow whole.    Marland Kitchen atorvastatin (LIPITOR) 10 MG tablet Take 10 mg by mouth daily.    . cyclobenzaprine (FLEXERIL) 10 MG tablet Take 10 mg by mouth 2 (two) times daily as needed for muscle spasms.     Marland Kitchen doxycycline (VIBRA-TABS) 100 MG tablet Take 1 tablet (100 mg total) by mouth every 12 (twelve) hours. 3 tablet 0  . fluticasone (FLONASE) 50 MCG/ACT nasal spray Place 2 sprays into both nostrils daily as needed for allergies. 48 g 1  . fluticasone furoate-vilanterol (BREO ELLIPTA) 200-25 MCG/INH AEPB Inhale 1 puff into the  lungs daily. 30 each 11  . INCRUSE ELLIPTA 62.5 MCG/INH AEPB Inhale 1 puff into the lungs daily.    Marland Kitchen ipratropium-albuterol (DUONEB) 0.5-2.5 (3) MG/3ML SOLN Inhale 3 mLs into the lungs every 6 (six) hours as needed. (Patient taking differently: Inhale 3 mLs into the lungs every 6 (six) hours as needed (wheezing). ) 360 mL 1  . levocetirizine (XYZAL) 5 MG tablet Take 1 tablet (5 mg total) by mouth every evening. 90 tablet 1  . losartan-hydrochlorothiazide (HYZAAR) 100-25 MG tablet Take 1 tablet by mouth daily.    . montelukast (SINGULAIR) 10 MG tablet Take 10 mg by mouth at bedtime.    . Multiple Vitamin (MULTIVITAMIN WITH MINERALS) TABS tablet Take 1 tablet by  mouth daily. Centrum Multivitamin    . predniSONE (DELTASONE) 20 MG tablet Take 1 tablet (20 mg total) by mouth 2 (two) times daily with a meal for 3 days, THEN 1 tablet (20 mg total) daily with breakfast for 3 days, THEN 0.5 tablets (10 mg total) daily with breakfast for 3 days. 10.5 tablet 0  . Vitamin D, Ergocalciferol, (DRISDOL) 1.25 MG (50000 UNIT) CAPS capsule Take 50,000 Units by mouth every Monday.     . zolpidem (AMBIEN) 10 MG tablet Take 10 mg by mouth at bedtime as needed for sleep.    Marland Kitchen amLODipine (NORVASC) 5 MG tablet Take 1 tablet (5 mg total) by mouth daily. 90 tablet 3  . furosemide (LASIX) 40 MG tablet Take 1 tablet (40 mg total) by mouth daily as needed for fluid or edema (WEIGHT GAIN OF 2LBS, SHORTNESS OF BREATH). 30 tablet 3   Current Facility-Administered Medications  Medication Dose Route Frequency Provider Last Rate Last Admin  . Benralizumab SOSY 30 mg  30 mg Subcutaneous Q8 Toney Reil, MD   30 mg at 10/30/19 0370    Allergies:   Banana    Social History:  The patient  reports that she quit smoking about 21 years ago. Her smoking use included cigarettes. She has never used smokeless tobacco. She reports that she does not drink alcohol and does not use drugs.   Family History:  The patient's family history includes Cancer in her father; Hypertension in her mother; Stroke in her mother.    ROS:  Please see the history of present illness.   Otherwise, review of systems are positive for none.   All other systems are reviewed and negative.    PHYSICAL EXAM: VS:  BP (!) 168/88   Pulse 100   Ht 5' 10"  (1.778 m)   Wt 297 lb 6.4 oz (134.9 kg)   SpO2 98%   BMI 42.67 kg/m  , BMI Body mass index is 42.67 kg/m. GENERAL:  Well appearing HEENT:  Pupils equal round and reactive, fundi not visualized, oral mucosa unremarkable NECK:  No jugular venous distention, waveform within normal limits, carotid upstroke brisk and symmetric, no bruits, no  thyromegaly LYMPHATICS:  No cervical, inguinal adenopathy LUNGS: Few scattered wheezes BACK:  No CVA tenderness CHEST:  Unremarkable HEART:  PMI not displaced or sustained,S1 and S2 within normal limits, no S3, no S4, no clicks, no rubs, no murmurs ABD:  Flat, positive bowel sounds normal in frequency in pitch, no bruits, no rebound, no guarding, no midline pulsatile mass, no hepatomegaly, no splenomegaly EXT:  2 plus pulses throughout, no edema, no cyanosis no clubbing SKIN:  No rashes no nodules NEURO:  Cranial nerves II through XII grossly intact, motor grossly intact throughout PSYCH:  Cognitively intact, oriented to person place and time    EKG:  EKG is ordered today. The ekg ordered today demonstrates sinus rhythm, rate 95, axis within normal limits, intervals within normal limits, nonspecific ST-T wave changes.   Recent Labs: 05/22/2019: ALT 18 06/03/2019: Magnesium 1.9 11/27/2019: B Natriuretic Peptide 51.6 11/28/2019: BUN 22; Creatinine, Ser 0.89; Hemoglobin 10.7; Platelets 289; Potassium 3.9; Sodium 138    Lipid Panel No results found for: CHOL, TRIG, HDL, CHOLHDL, VLDL, LDLCALC, LDLDIRECT    Wt Readings from Last 3 Encounters:  12/01/19 297 lb 6.4 oz (134.9 kg)  11/26/19 300 lb (136.1 kg)  10/31/19 (!) 303 lb 9.6 oz (137.7 kg)      Other studies Reviewed: Additional studies/ records that were reviewed today include: Hospital admission records.. Review of the above records demonstrates:  Please see elsewhere in the note.     ASSESSMENT AND PLAN:  CHEST PAIN: Her chest pain is somewhat atypical. However, she has significant cardiovascular risk factors. She would be able to walk on a treadmill. I'm going to screen her with a Lexiscan Myoview. Further evaluation will be based on this.  SOB: I think she has acute diastolic heart failure. We talked about salt restriction. We talked about fluid management. She'll also be managed with blood pressure control as below. I  will give her as needed Lasix as she is out of the dose that she was sent home with.  HTN: We talked at length about weight management. I'm to add amlodipine 5 mg daily.  OBESITY: As above. I suggested the Mediterranean diet and a plan for weight loss and activity.  COVID EDUCATION: She has had her vaccinations.   Current medicines are reviewed at length with the patient today.  The patient does not have concerns regarding medicines.  The following changes have been made:  no change  Labs/ tests ordered today include:   Orders Placed This Encounter  Procedures  . MYOCARDIAL PERFUSION IMAGING  . EKG 12-Lead     Disposition:   FU with me in six weeks.     Signed, Minus Breeding, MD  12/01/2019 11:05 AM    [place]

## 2019-12-01 ENCOUNTER — Ambulatory Visit (INDEPENDENT_AMBULATORY_CARE_PROVIDER_SITE_OTHER): Payer: Medicare Other | Admitting: Cardiology

## 2019-12-01 ENCOUNTER — Other Ambulatory Visit: Payer: Self-pay

## 2019-12-01 ENCOUNTER — Encounter: Payer: Self-pay | Admitting: Cardiology

## 2019-12-01 VITALS — BP 168/88 | HR 100 | Ht 70.0 in | Wt 297.4 lb

## 2019-12-01 DIAGNOSIS — Z7189 Other specified counseling: Secondary | ICD-10-CM

## 2019-12-01 DIAGNOSIS — R0602 Shortness of breath: Secondary | ICD-10-CM | POA: Diagnosis not present

## 2019-12-01 DIAGNOSIS — I700 Atherosclerosis of aorta: Secondary | ICD-10-CM

## 2019-12-01 DIAGNOSIS — R072 Precordial pain: Secondary | ICD-10-CM | POA: Diagnosis not present

## 2019-12-01 MED ORDER — FUROSEMIDE 40 MG PO TABS
40.0000 mg | ORAL_TABLET | Freq: Every day | ORAL | 3 refills | Status: DC | PRN
Start: 2019-12-01 — End: 2020-05-24

## 2019-12-01 MED ORDER — AMLODIPINE BESYLATE 5 MG PO TABS
5.0000 mg | ORAL_TABLET | Freq: Every day | ORAL | 3 refills | Status: DC
Start: 2019-12-01 — End: 2020-05-24

## 2019-12-01 NOTE — Patient Instructions (Addendum)
Medication Instructions:  START NORVASC 5MG DAILY START LASIX 40MG DAILY AS NEEDED FOR SWELLING, WEIGHT GAIN OR SHORTNESS OF BREATH *If you need a refill on your cardiac medications before your next appointment, please call your pharmacy*  Lab Work: NONE ORDERED THIS VISIT  Testing/Procedures: Your physician has requested that you have a lexiscan myoview. For further information please visit [email or web address]. Please follow instruction sheet, as given.  Follow-Up: At [facility], you and your health needs are our priority.  As part of our continuing mission to provide you with exceptional heart care, we have created designated Provider Care Teams.  These Care Teams include your primary Cardiologist (physician) and Advanced Practice Providers (APPs -  Physician Assistants and Nurse Practitioners) who all work together to provide you with the care you need, when you need it.  We recommend signing up for the patient portal called "MyChart".  Sign up information is provided on this After Visit Summary.  MyChart is used to connect with patients for Virtual Visits (Telemedicine).  Patients are able to view lab/test results, encounter notes, upcoming appointments, etc.  Non-urgent messages can be sent to your provider as well.   To learn more about what you can do with MyChart, go to [email or web address].    Your next appointment:   6 week(s)  The format for your next appointment:   In Person  Provider:   Minus Breeding, MD   [facility] Instructions Your physician has requested that you have a lexiscan myoview. For further information please visit [email or web address]. Please follow instruction sheet, as given. This will take place at [address], [address]  How to prepare for your Myocardial Perfusion Test:  Do not eat or drink 3 hours prior to your test, except you may have water.  Do not consume products containing caffeine (regular or decaffeinated) 12 hours prior to your test.  (ex: coffee, chocolate, sodas, tea).  Do bring a list of your current medications with you.  If not listed below, you may take your medications as normal.  Do wear comfortable clothes (no dresses or overalls) and walking shoes, tennis shoes preferred (No heels or open toe shoes are allowed).  Do NOT wear cologne, perfume, aftershave, or lotions (deodorant is allowed).  The test will take approximately 3 to 4 hours to complete  If these instructions are not followed, your test will have to be rescheduled.

## 2019-12-05 ENCOUNTER — Ambulatory Visit: Payer: Medicare Other | Admitting: Family

## 2019-12-12 ENCOUNTER — Ambulatory Visit: Payer: Medicare Other | Admitting: Allergy & Immunology

## 2019-12-14 ENCOUNTER — Telehealth (HOSPITAL_COMMUNITY): Payer: Self-pay

## 2019-12-14 DIAGNOSIS — R918 Other nonspecific abnormal finding of lung field: Secondary | ICD-10-CM | POA: Insufficient documentation

## 2019-12-14 NOTE — Telephone Encounter (Signed)
Encounter complete. 

## 2019-12-15 ENCOUNTER — Other Ambulatory Visit: Payer: Self-pay | Admitting: Family Medicine

## 2019-12-15 DIAGNOSIS — Z1231 Encounter for screening mammogram for malignant neoplasm of breast: Secondary | ICD-10-CM

## 2019-12-19 ENCOUNTER — Ambulatory Visit (HOSPITAL_COMMUNITY)
Admission: RE | Admit: 2019-12-19 | Discharge: 2019-12-19 | Disposition: A | Discharge status: Home / Self Care | Payer: Medicare Other | Source: Ambulatory Visit | Attending: Cardiology | Admitting: Cardiology

## 2019-12-19 ENCOUNTER — Other Ambulatory Visit: Payer: Self-pay

## 2019-12-19 DIAGNOSIS — R072 Precordial pain: Secondary | ICD-10-CM | POA: Diagnosis present

## 2019-12-19 DIAGNOSIS — R0602 Shortness of breath: Secondary | ICD-10-CM | POA: Insufficient documentation

## 2019-12-19 MED ORDER — REGADENOSON 0.4 MG/5ML IV SOLN
0.4000 mg | Freq: Once | INTRAVENOUS | Status: AC
  Administered 2019-12-19: 0.4 mg via INTRAVENOUS

## 2019-12-19 MED ORDER — TECHNETIUM TC 99M TETROFOSMIN IV KIT
32.0000 | PACK | Freq: Once | INTRAVENOUS | Status: AC | PRN
  Administered 2019-12-19: 32 via INTRAVENOUS
  Filled 2019-12-19: qty 32

## 2019-12-20 ENCOUNTER — Other Ambulatory Visit: Payer: Self-pay | Admitting: Family Medicine

## 2019-12-20 ENCOUNTER — Ambulatory Visit (HOSPITAL_COMMUNITY)
Admission: RE | Admit: 2019-12-20 | Discharge: 2019-12-20 | Disposition: A | Discharge status: Home / Self Care | Payer: Medicare Other | Source: Ambulatory Visit | Attending: Cardiology | Admitting: Cardiology

## 2019-12-20 ENCOUNTER — Ambulatory Visit
Admission: RE | Admit: 2019-12-20 | Discharge: 2019-12-20 | Disposition: A | Discharge status: Home / Self Care | Payer: Medicare Other | Source: Ambulatory Visit | Attending: Family Medicine | Admitting: Family Medicine

## 2019-12-20 DIAGNOSIS — R0600 Dyspnea, unspecified: Secondary | ICD-10-CM

## 2019-12-20 DIAGNOSIS — R059 Cough, unspecified: Secondary | ICD-10-CM

## 2019-12-20 DIAGNOSIS — E261 Secondary hyperaldosteronism: Secondary | ICD-10-CM | POA: Insufficient documentation

## 2019-12-20 LAB — MYOCARDIAL PERFUSION IMAGING
LV dias vol: 87 mL (ref 46–106)
LV sys vol: 30 mL
Peak HR: 113 {beats}/min
Rest HR: 82 {beats}/min
SDS: 1
SRS: 5
SSS: 6
TID: 1.36

## 2019-12-20 MED ORDER — TECHNETIUM TC 99M TETROFOSMIN IV KIT
30.1000 | PACK | Freq: Once | INTRAVENOUS | Status: AC | PRN
  Administered 2019-12-20: 30.1 via INTRAVENOUS

## 2019-12-25 ENCOUNTER — Telehealth: Payer: Self-pay | Admitting: *Deleted

## 2019-12-25 ENCOUNTER — Ambulatory Visit: Payer: Self-pay

## 2019-12-25 NOTE — Telephone Encounter (Signed)
Left message to call back  

## 2019-12-25 NOTE — Telephone Encounter (Signed)
-----   Message from Minus Breeding, MD sent at 12/21/2019  9:39 AM EDT ----- Negative Lexiscan Myoview  No further ischemia work up.  Call [name] with the results and send results to Loretha Brasil, FNP (Inactive)

## 2019-12-28 NOTE — Telephone Encounter (Signed)
Pt aware of myoview results ./cy

## 2019-12-28 NOTE — Telephone Encounter (Signed)
Follow up   Pt is returning call    

## 2019-12-28 NOTE — Telephone Encounter (Signed)
Left message to call back  

## 2020-01-02 ENCOUNTER — Other Ambulatory Visit: Payer: Self-pay

## 2020-01-02 ENCOUNTER — Ambulatory Visit (INDEPENDENT_AMBULATORY_CARE_PROVIDER_SITE_OTHER): Payer: Medicare Other

## 2020-01-02 DIAGNOSIS — J4550 Severe persistent asthma, uncomplicated: Secondary | ICD-10-CM

## 2020-01-03 ENCOUNTER — Ambulatory Visit: Payer: Medicare Other

## 2020-01-08 ENCOUNTER — Ambulatory Visit (INDEPENDENT_AMBULATORY_CARE_PROVIDER_SITE_OTHER): Payer: Medicare Other | Admitting: Pulmonary Disease

## 2020-01-08 ENCOUNTER — Other Ambulatory Visit: Payer: Self-pay

## 2020-01-08 ENCOUNTER — Encounter: Payer: Self-pay | Admitting: Pulmonary Disease

## 2020-01-08 VITALS — BP 130/80 | HR 100 | Temp 98.5°F | Ht 70.0 in | Wt 304.2 lb

## 2020-01-08 DIAGNOSIS — J4550 Severe persistent asthma, uncomplicated: Secondary | ICD-10-CM | POA: Diagnosis not present

## 2020-01-08 DIAGNOSIS — G4733 Obstructive sleep apnea (adult) (pediatric): Secondary | ICD-10-CM | POA: Diagnosis not present

## 2020-01-08 DIAGNOSIS — R918 Other nonspecific abnormal finding of lung field: Secondary | ICD-10-CM

## 2020-01-08 HISTORY — DX: Other nonspecific abnormal finding of lung field: R91.8

## 2020-01-08 NOTE — Assessment & Plan Note (Signed)
5 mm pulmonary nodule seen on CTA chest in June/2021  Plan: We will repeat CT of chest in 1 year

## 2020-01-08 NOTE — Assessment & Plan Note (Signed)
Poorly controlled Trying to use CPAP therapy daily  Plan: We will order CPAP titration to help with further evaluation of patient's severe obstructive sleep apnea and exactly which pressure settings she needs

## 2020-01-08 NOTE — Progress Notes (Addendum)
@Patient  ID: Patty Howard, female    DOB: May 25, 1956, 64 y.o.   MRN: [id number]  Chief Complaint  Patient presents with  . Follow-up    shortness of breath with exertion, just went off prednisone and antibiotic    Referring provider: No ref. provider found  HPI:  64 year old female former smoker followed in our office for obstructive lung disease as well as severe obstructive sleep apnea.  PMH: Prediabetes, hypertension, aortic arthrosclerosis Smoker/ Smoking History: Former smoker.  Quit 2000.  5-pack-year smoking history Maintenance: Breo Ellipta 200, Incruse Pt of: Dr. Jenetta Downer  01/08/2020  - Visit   64 year old female former smoker followed in our office for asthma/COPD and dyspnea.  Patient was last evaluated our office in March/2021.  She is followed by Dr. Ander Slade.  At that point in time she was recommended to be maintained on CPAP therapy.  And she was given a prescription for a mask change.  She was treated with an exacerbation of her obstructive lung disease with Levaquin as well as prednisone.  And she was posted follow-up in 3 months.  Patient reports adherence to [facility] Ellipta 200.  She reports she was recently treated with prednisone antibiotic.  Patient is also followed by Dr. Ernst Bowler with allergy asthma. Excerpt   1. Severe persistent asthma, uncomplicated - Lung testing looked stable.  - We are going to order full pulmonary function testing. - We are going to get a chest CT as well.  - We will restart your nebulized medications.  - Stop your current Breo and Incruse.  - Daily controller medication(s): Pulmicort 0.67m (inhaled steroid) TWO TIMES DAILY + Perforomist 248m TWO TIMES DAILY + Yupelri ONE TIME DAILY + Fasenra every 8 weeks  - Prior to physical activity: albuterol 2 puffs 10-15 minutes before physical activity. - Rescue medications: albuterol 4 puffs every 4-6 hours as needed, albuterol nebulizer one vial every 4-6 hours as needed or DuoNeb nebulizer  one vial every 4-6 hours as needed - Asthma control goals:  * Full participation in all desired activities (may need albuterol before activity) * Albuterol use two time or less a week on average (not counting use with activity) * Cough interfering with sleep two time or less a month * Oral steroids no more than once a year * No hospitalizations  2. Chronic rhinitis (dust mites, molds) - Continue with Xyzal (levocetirizine) 75m61maily. - Continue with Flonase one spray per nostril twice daily to control any postnasal drip.  - Finish out your prednisone and your antibiotics.   3. Gastroesophageal reflux disease - Continue with Protonix 41m9mce daily to control any silent reflux.  4. Return in about 4 weeks (around 11/28/2019). This can be an in-person, a virtual Webex or a telephone follow up visit.  Patient is also completed follow-up CT scan in June/2021 that showed a small 5 mm pulmonary nodule.  We will repeat a CT in 1 year.  Patient has a new primary care provider.  They recently treated her with prednisone.  Patient reports follow-up with allergy asthma but last office visit documented was in May/2021.  We will review and discuss this today.  Patient reports that she continues to receive immunotherapy and Fasenra injections.  Although she "missed last month".  She continues to have difficulty with exacerbations.  She remains adherent to Breo Ellipta 200 and Incruse Ellipta.  She needs full pulmonary function testing.  She has severe obstructive sleep apnea.  Her CPAP compliance report shows adequate compliance but  poor control.  See compliance were listed below:  12/07/2019-01/05/2020-CPAP compliance report-25 out of last 30 days used, 3 of those days greater than 4 hours, average usage 2 hours and 31 minutes, APAP setting 5-18, AHI 12.3  We will discuss and review this today.  Questionaires / Pulmonary Flowsheets:   ACT:  Asthma Control Test ACT Total Score  10/31/2019 5     MMRC: No flowsheet data found.  Epworth:  Results of the Epworth flowsheet 04/05/2019  Sitting and reading 3  Watching TV 3  Sitting, inactive in a public place (e.g. a theatre or a meeting) 3  As a passenger in a car for an hour without a break 3  Lying down to rest in the afternoon when circumstances permit 3  Sitting and talking to someone 3  Sitting quietly after a lunch without alcohol 3  In a car, while stopped for a few minutes in traffic 3  Total score 24    Tests:   05/30/2020-home sleep study-AHI 32.5, SaO2 low 77%  10/31/2019-spirometry-FVC 1.76 (54% predicted), ratio 72, FEV1 1.27 (50% predicted)  FENO:  No results found for: NITRICOXIDE  PFT: No flowsheet data found.  WALK:  No flowsheet data found.  Imaging: DG Chest 2 View  Result Date: 12/21/2019 CLINICAL DATA:  64 year old female with a history of cough and dyspnea EXAM: CHEST - 2 VIEW COMPARISON:  Chest CT 11/26/2019, plain film 11/26/2019 FINDINGS: Cardiomediastinal silhouette unchanged in size and contour. No pneumothorax. No pleural effusion. No confluent airspace disease. Coarsened interstitial markings are similar to the prior. Degenerative changes of the spine.  No acute displaced fracture IMPRESSION: Negative for acute cardiopulmonary disease. Electronically Signed   By: Corrie Mckusick D.O.   On: 12/21/2019 11:41   MYOCARDIAL PERFUSION IMAGING  Result Date: 12/20/2019  The left ventricular ejection fraction is hyperdynamic (>65%).  Nuclear stress EF: 65%.  There was no ST segment deviation noted during stress.  No T wave inversion was noted during stress.  The study is normal.  This is a low risk study.  1. Slightly reduced counts in the apex on rest and stress with normal wall motion consistent with apical thinning artifact. 2. Normal myocardial perfusion imaging study without ischemia or infarction. 3. TID ratio is upper limits of normal for pharmacological stress. Visually, there is no TID  present. Suspect artifactual due to small LV cavity. 4. Normal LVEF, 65%. 5. This is a low-risk study.     Lab Results:  CBC    Component Value Date/Time   WBC 13.0 (H) 11/28/2019 0429   RBC 4.10 11/28/2019 0429   HGB 10.7 (L) 11/28/2019 0429   HGB 11.6 10/31/2019 1007   HCT 35.0 (L) 11/28/2019 0429   HCT 36.5 10/31/2019 1007   PLT 289 11/28/2019 0429   MCV 85.4 11/28/2019 0429   MCV 82 10/31/2019 1007   MCH 26.1 11/28/2019 0429   MCHC 30.6 11/28/2019 0429   RDW 15.7 (H) 11/28/2019 0429   RDW 14.5 10/31/2019 1007   LYMPHSABS 2.3 10/31/2019 1007   MONOABS 0.4 06/03/2019 2220   EOSABS 0.0 10/31/2019 1007   BASOSABS 0.0 10/31/2019 1007    BMET    Component Value Date/Time   NA 138 11/28/2019 0429   K 3.9 11/28/2019 0429   CL 99 11/28/2019 0429   CO2 28 11/28/2019 0429   GLUCOSE 135 (H) 11/28/2019 0429   BUN 22 11/28/2019 0429   CREATININE 0.89 11/28/2019 0429   CALCIUM 8.9 11/28/2019 0429  GFRNONAA >60 11/28/2019 0429   GFRAA >60 11/28/2019 0429    BNP    Component Value Date/Time   BNP 51.6 11/27/2019 0413    ProBNP No results found for: PROBNP  Specialty Problems      Pulmonary Problems   Asthma attack   Dyspnea   COPD exacerbation (HCC)   Asthma   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) (HCC)   Acute asthma exacerbation   Sleep apnea    does not wear CPAP         Allergies  Allergen Reactions  . Banana Hives and Itching    Immunization History  Administered Date(s) Administered  . Influenza, High Dose Seasonal PF 03/06/2019  . Moderna SARS-COVID-2 Vaccination 08/19/2019, 09/14/2019    Past Medical History:  Diagnosis Date  . Anxiety   . Arthritis   . COPD (chronic obstructive pulmonary disease) ([place])   . Fracture    closed displaced left radial head  . Hypertension   . Sleep apnea    does not wear CPAP    Tobacco History: Social History   Tobacco Use  Smoking Status Former Smoker  . Packs/day: 0.50  .  Years: 10.00  . Pack years: 5.00  . Types: Cigarettes  . Quit date: 06/15/1998  . Years since quitting: 21.5  Smokeless Tobacco Never Used   Counseling given: Yes   Continue to not smoke  Outpatient Encounter Medications as of 01/08/2020  Medication Sig  . albuterol (VENTOLIN HFA) 108 (90 Base) MCG/ACT inhaler INHALE 1-2 PUFFS INTO THE LUNGS EVERY 6 (SIX) HOURS AS NEEDED FOR WHEEZING OR SHORTNESS OF BREATH. (Patient taking differently: Inhale 1-2 puffs into the lungs every 6 (six) hours as needed for wheezing or shortness of breath. )  . amLODipine (NORVASC) 5 MG tablet Take 1 tablet (5 mg total) by mouth daily.  Marland Kitchen aspirin EC 81 MG tablet Take 81 mg by mouth daily. Swallow whole.  Marland Kitchen atorvastatin (LIPITOR) 10 MG tablet Take 10 mg by mouth daily.  . cyclobenzaprine (FLEXERIL) 10 MG tablet Take 10 mg by mouth 2 (two) times daily as needed for muscle spasms.   Marland Kitchen doxycycline (VIBRA-TABS) 100 MG tablet Take 1 tablet (100 mg total) by mouth every 12 (twelve) hours.  . fluticasone (FLONASE) 50 MCG/ACT nasal spray Place 2 sprays into both nostrils daily as needed for allergies.  . fluticasone furoate-vilanterol (BREO ELLIPTA) 200-25 MCG/INH AEPB Inhale 1 puff into the lungs daily.  . furosemide (LASIX) 40 MG tablet Take 1 tablet (40 mg total) by mouth daily as needed for fluid or edema (WEIGHT GAIN OF 2LBS, SHORTNESS OF BREATH).  . INCRUSE ELLIPTA 62.5 MCG/INH AEPB Inhale 1 puff into the lungs daily.  Marland Kitchen ipratropium-albuterol (DUONEB) 0.5-2.5 (3) MG/3ML SOLN Inhale 3 mLs into the lungs every 6 (six) hours as needed. (Patient taking differently: Inhale 3 mLs into the lungs every 6 (six) hours as needed (wheezing). )  . levocetirizine (XYZAL) 5 MG tablet Take 1 tablet (5 mg total) by mouth every evening.  Marland Kitchen losartan-hydrochlorothiazide (HYZAAR) 100-25 MG tablet Take 1 tablet by mouth daily.  . montelukast (SINGULAIR) 10 MG tablet Take 10 mg by mouth at bedtime.  . Multiple Vitamin (MULTIVITAMIN WITH  MINERALS) TABS tablet Take 1 tablet by mouth daily. Centrum Multivitamin  . Vitamin D, Ergocalciferol, (DRISDOL) 1.25 MG (50000 UNIT) CAPS capsule Take 50,000 Units by mouth every Monday.   . zolpidem (AMBIEN) 10 MG tablet Take 10 mg by mouth at bedtime as needed for  sleep.  . [DISCONTINUED] pantoprazole (PROTONIX) 40 MG tablet Take 1 tablet (40 mg total) by mouth daily. (Patient not taking: Reported on 05/21/2019)   Facility-Administered Encounter Medications as of 01/08/2020  Medication  . Benralizumab SOSY 30 mg     Review of Systems  Review of Systems  Constitutional: Positive for fatigue. Negative for activity change and fever.  HENT: Positive for congestion and rhinorrhea. Negative for sinus pressure, sinus pain and sore throat.   Respiratory: Positive for cough, shortness of breath and wheezing.   Cardiovascular: Negative for chest pain, palpitations and leg swelling.  Gastrointestinal: Negative for diarrhea, nausea and vomiting.  Musculoskeletal: Negative for arthralgias.  Neurological: Negative for dizziness.  Psychiatric/Behavioral: Negative for sleep disturbance. The patient is not nervous/anxious.      Physical Exam  BP (!) 130/80 (BP Location: Right Arm, Cuff Size: Normal)   Pulse 100   Temp 98.5 F (36.9 C) (Oral)   Ht 5' 10"  (1.778 m)   Wt (!) 304 lb 3.2 oz (138 kg)   SpO2 99% Comment: room air  BMI 43.65 kg/m   Wt Readings from Last 5 Encounters:  01/08/20 (!) 304 lb 3.2 oz (138 kg)  12/19/19 297 lb (134.7 kg)  12/01/19 297 lb 6.4 oz (134.9 kg)  11/26/19 300 lb (136.1 kg)  10/31/19 (!) 303 lb 9.6 oz (137.7 kg)    BMI Readings from Last 5 Encounters:  01/08/20 43.65 kg/m  12/19/19 42.62 kg/m  12/01/19 42.67 kg/m  11/26/19 43.05 kg/m  10/31/19 43.56 kg/m     Physical Exam Vitals and nursing note reviewed.  Constitutional:      General: She is not in acute distress.    Appearance: Normal appearance. She is obese.  HENT:     Head:  Normocephalic and atraumatic.     Right Ear: External ear normal.     Left Ear: External ear normal.     Nose: Nose normal.     Mouth/Throat:     Mouth: Mucous membranes are moist.     Pharynx: Oropharynx is clear.  Eyes:     Pupils: Pupils are equal, round, and reactive to light.  Cardiovascular:     Rate and Rhythm: Normal rate and regular rhythm.     Pulses: Normal pulses.     Heart sounds: Normal heart sounds. No murmur heard.   Pulmonary:     Breath sounds: No decreased air movement. Rhonchi present. No decreased breath sounds, wheezing or rales.  Musculoskeletal:     Cervical back: Normal range of motion.  Skin:    General: Skin is warm and dry.     Capillary Refill: Capillary refill takes less than 2 seconds.  Neurological:     General: No focal deficit present.     Mental Status: She is alert and oriented to person, place, and time. Mental status is at baseline.     Gait: Gait normal.  Psychiatric:        Mood and Affect: Mood normal.        Behavior: Behavior normal.        Thought Content: Thought content normal.        Judgment: Judgment normal.       Assessment & Plan:   Asthma Acute on chronic issue Recently treated for asthma exacerbation Established with Dr. Ernst Bowler, has not followed up as instructed Maintained on Breo Ellipta 200, Incruse Ellipta Receiving immunotherapy and Fasenra  Plan: Encourage patient to schedule follow-up immediately with allergy asthma Continue Breo  Ellipta 200 Continue Incruse Ellipta Schedule pulmonary function testing today before leaving our office 6-week follow-up with our office Start flutter valve  Sleep apnea Poorly controlled Trying to use CPAP therapy daily  Plan: We will order CPAP titration to help with further evaluation of patient's severe obstructive sleep apnea and exactly which pressure settings she needs  Abnormal findings on diagnostic imaging of lung 5 mm pulmonary nodule seen on CTA chest in  June/2021  Plan: We will repeat CT of chest in 1 year    Return in about 6 weeks (around 02/19/2020), or if symptoms worsen or fail to improve, for Follow up with Wyn Quaker FNP-C.   Lauraine Rinne, NP 01/08/2020   This appointment required 34 minutes of patient care (this includes precharting, chart review, review of results, face-to-face care, etc.).

## 2020-01-08 NOTE — Patient Instructions (Addendum)
You were seen today by Lauraine Rinne, NP  for:   1. Severe persistent asthma without complication  Breo Ellipta 200 >>> Take 1 puff daily in the morning right when you wake up >>>Rinse your mouth out after use >>>This is a daily maintenance inhaler, NOT a rescue inhaler >>>Contact our office if you are having difficulties affording or obtaining this medication >>>It is important for you to be able to take this daily and not miss any doses    Incruse Ellipta  >>> Take 1 puff daily in the morning right when you wake up >>>Rinse your mouth out after use >>>This is a daily maintenance inhaler, NOT a rescue inhaler >>>Contact our office if you are having difficulties affording or obtaining this medication >>>It is important for you to be able to take this daily and not miss any doses    We will order a flutter valve as we are currently out of them in our office   Use a flutter valve 10 breaths twice a day or 4 to 5 breaths 4-5 times a day to help clear mucus out  Only use your albuterol as a rescue medication to be used if you can't catch your breath by resting or doing a relaxed purse lip breathing pattern.  - The less you use it, the better it will work when you need it. - Ok to use up to 2 puffs  every 4 hours if you must but call for immediate appointment if use goes up over your usual need - Don't leave home without it !!  (think of it like the spare tire for your car)     Continue [place] allergy asthma   2. Obstructive sleep apnea syndrome  - Cpap titration; Future  We recommend that you continue using your CPAP daily >>>Keep up the hard work using your device >>> Goal should be wearing this for the entire night that you are sleeping, at least 4 to 6 hours  Remember:  . Do not drive or operate heavy machinery if tired or drowsy.  . Please notify the supply company and office if you are unable to use your device regularly due to missing  supplies or machine being broken.  . Work on maintaining a healthy weight and following your recommended nutrition plan  . Maintain proper daily exercise and movement  . Maintaining proper use of your device can also help improve management of other chronic illnesses such as: Blood pressure, blood sugars, and weight management.   BiPAP/ CPAP Cleaning:  >>>Clean weekly, with Dawn soap, and bottle brush.  Set up to air dry. >>> Wipe mask out daily with wet wipe or towelette   3.  Pulmonary nodule  We will repeat a CT of your chest in June/2022  We recommend today:  Orders Placed This Encounter  Procedures  . Cpap titration    Standing Status:   Future    Standing Expiration Date:   01/07/2021    Order Specific Question:   Where should this test be performed:    Answer:   [facility] - Pulmonary   Orders Placed This Encounter  Procedures  . Cpap titration   No orders of the defined types were placed in this encounter.   Follow Up:    Return in about 6 weeks (around 02/19/2020), or if symptoms worsen or fail to improve, for Follow up with Wyn Quaker FNP-C.   Please do your part to reduce  the spread of COVID-19:      Reduce your risk of any infection  and COVID19 by using the similar precautions used for avoiding the common cold or flu:  Marland Kitchen Wash your hands often with soap and warm water for at least 20 seconds.  If soap and water are not readily available, use an alcohol-based hand sanitizer with at least 60% alcohol.  . If coughing or sneezing, cover your mouth and nose by coughing or sneezing into the elbow areas of your shirt or coat, into a tissue or into your sleeve (not your hands). Langley Gauss A MASK when in public  . Avoid shaking hands with others and consider head nods or verbal greetings only. . Avoid touching your eyes, nose, or mouth with unwashed hands.  . Avoid close contact with people who are sick. . Avoid places or events with large numbers of people in one location, like  concerts or sporting events. . If you have some symptoms but not all symptoms, continue to monitor at home and seek medical attention if your symptoms worsen. . If you are having a medical emergency, call 911.   [place] / e-Visit: [email or web address]         [facility]: [facility]: [phone]                   [facility]: [phone]     It is flu season:   >>> Best ways to protect herself from the flu: Receive the yearly flu vaccine, practice good hand hygiene washing with soap and also using hand sanitizer when available, eat a nutritious meals, get adequate rest, hydrate appropriately   Please contact the office if your symptoms worsen or you have concerns that you are not improving.   Thank you for choosing [facility] for your healthcare, and for allowing [place] to partner with you on your healthcare journey. I am thankful to be able to provide care to you today.   Wyn Quaker FNP-C

## 2020-01-08 NOTE — Assessment & Plan Note (Addendum)
Acute on chronic issue Recently treated for asthma exacerbation Established with Dr. Ernst Bowler, has not followed up as instructed Maintained on Breo Ellipta 200, Incruse Ellipta Receiving immunotherapy and Fasenra  Plan: Encourage patient to schedule follow-up immediately with allergy asthma Continue Breo Ellipta 200 Continue Incruse Ellipta Schedule pulmonary function testing today before leaving our office 6-week follow-up with our office Start flutter valve

## 2020-01-11 ENCOUNTER — Other Ambulatory Visit: Payer: Self-pay | Admitting: Allergy & Immunology

## 2020-01-14 ENCOUNTER — Encounter (HOSPITAL_COMMUNITY): Payer: Self-pay | Admitting: Emergency Medicine

## 2020-01-14 ENCOUNTER — Emergency Department (HOSPITAL_COMMUNITY)
Admission: EM | Admit: 2020-01-14 | Discharge: 2020-01-15 | Disposition: A | Discharge status: Home / Self Care | Payer: Medicare Other | Attending: Emergency Medicine | Admitting: Emergency Medicine

## 2020-01-14 ENCOUNTER — Other Ambulatory Visit: Payer: Self-pay

## 2020-01-14 DIAGNOSIS — I10 Essential (primary) hypertension: Secondary | ICD-10-CM | POA: Diagnosis not present

## 2020-01-14 DIAGNOSIS — R220 Localized swelling, mass and lump, head: Secondary | ICD-10-CM | POA: Diagnosis present

## 2020-01-14 DIAGNOSIS — F17210 Nicotine dependence, cigarettes, uncomplicated: Secondary | ICD-10-CM | POA: Diagnosis not present

## 2020-01-14 DIAGNOSIS — J45901 Unspecified asthma with (acute) exacerbation: Secondary | ICD-10-CM | POA: Diagnosis not present

## 2020-01-14 DIAGNOSIS — Z79899 Other long term (current) drug therapy: Secondary | ICD-10-CM | POA: Diagnosis not present

## 2020-01-14 DIAGNOSIS — K047 Periapical abscess without sinus: Secondary | ICD-10-CM | POA: Diagnosis not present

## 2020-01-14 DIAGNOSIS — Z7901 Long term (current) use of anticoagulants: Secondary | ICD-10-CM | POA: Diagnosis not present

## 2020-01-14 DIAGNOSIS — Z7982 Long term (current) use of aspirin: Secondary | ICD-10-CM | POA: Diagnosis not present

## 2020-01-14 DIAGNOSIS — J441 Chronic obstructive pulmonary disease with (acute) exacerbation: Secondary | ICD-10-CM | POA: Insufficient documentation

## 2020-01-14 MED ORDER — AMOXICILLIN-POT CLAVULANATE 875-125 MG PO TABS
1.0000 | ORAL_TABLET | Freq: Once | ORAL | Status: AC
  Administered 2020-01-15: 1 via ORAL
  Filled 2020-01-14: qty 1

## 2020-01-14 NOTE — ED Notes (Signed)
Patient had wisdom tooth pulled on Monday. She states on Thursday her mouth starting swelling. Patient was only given pain medication.

## 2020-01-14 NOTE — ED Triage Notes (Signed)
Patient has a swollen jaw on the right side of her face. Patient states this started on Thursday.

## 2020-01-15 MED ORDER — HYDROCODONE-ACETAMINOPHEN 5-325 MG PO TABS: 1.0000 | ORAL_TABLET | Freq: Four times a day (QID) | ORAL | 0 refills | Status: DC | PRN

## 2020-01-15 MED ORDER — AMOXICILLIN-POT CLAVULANATE 875-125 MG PO TABS
1.0000 | ORAL_TABLET | Freq: Two times a day (BID) | ORAL | 0 refills | Status: DC
Start: 2020-01-15 — End: 2020-01-15

## 2020-01-15 MED ORDER — AMOXICILLIN-POT CLAVULANATE 875-125 MG PO TABS
1.0000 | ORAL_TABLET | Freq: Two times a day (BID) | ORAL | 0 refills | Status: DC
Start: 2020-01-15 — End: 2020-03-14

## 2020-01-15 NOTE — ED Provider Notes (Signed)
Patty Howard Provider Note   CSN: [id number] Arrival date & time: 01/14/20  2258     History Chief Complaint  Patient presents with  . Facial Swelling    Patty Howard is a 64 y.o. female history of COPD, hypertension, here presenting with right facial swelling.  Patient states that she had dental procedure done 6 days ago. She states that she had her first tooth extracted.  She had progressive swelling of the right side of her face.  Patient states that she noticed bad taste in her mouth as well.  Patient denies trouble swallowing or trouble breathing.  Denies any fevers at home.  Patient called her dentist but was not prescribed any antibiotics or pain medicine.  She has been taking Motrin with no relief.  The history is provided by the patient.       Past Medical History:  Diagnosis Date  . Anxiety   . Arthritis   . COPD (chronic obstructive pulmonary disease) ([place])   . Fracture    closed displaced left radial head  . Hypertension   . Sleep apnea    does not wear CPAP    Patient Active Problem List   Diagnosis Date Noted  . Abnormal findings on diagnostic imaging of lung 01/08/2020  . Educated about COVID-19 virus infection 11/30/2019  . Aortic atherosclerosis ([place]) 11/30/2019  . Chest pain 11/26/2019  . Sleep apnea   . Acute asthma exacerbation 05/22/2019  . Left radial head fracture 02/03/2019  . Closed fracture of radial head 01/29/2019  . Prediabetes 10/19/2018  . Hypertension, essential, benign 10/19/2018  . 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) ([place]) 10/19/2018  . Insomnia 10/19/2018  . Elbow pain, chronic, left 10/19/2018  . Asthma 05/29/2018  . COPD exacerbation ([place]) 04/05/2018  . Dyspnea 10/12/2017  . Asthma attack 07/11/2017  . Pancolitis ([place]) 05/20/2017  . H/O Spinal surgery 01/18/2015  . HTN (hypertension) 01/18/2015  . Obesity 01/18/2015    Past Surgical History:  Procedure  Laterality Date  . BACK SURGERY     spinal stimulator  . COLONOSCOPY W/ BIOPSIES AND POLYPECTOMY    . DILATION AND CURETTAGE OF UTERUS    . LAPAROSCOPIC ROUX-EN-Y GASTRIC BYPASS WITH UPPER ENDOSCOPY AND REMOVAL OF LAP BAND    . RADIAL HEAD ARTHROPLASTY Left 02/03/2019   Procedure: LEFT RADIAL HEAD ARTHROPLASTY;  Surgeon: [name], MD;  Location: [facility];  Service: Orthopedics;  Laterality: Left;  AXILLARY BLOCK VS BIER BLOCK  . TUBAL LIGATION       OB History   No obstetric history on file.     Family History  Problem Relation Age of Onset  . Hypertension Mother   . Stroke Mother   . Cancer Father     Social History   Tobacco Use  . Smoking status: Former Smoker    Packs/day: 0.50    Years: 10.00    Pack years: 5.00    Types: Cigarettes    Quit date: 06/15/1998    Years since quitting: 21.6  . Smokeless tobacco: Never Used  Vaping Use  . Vaping Use: Never used  Substance Use Topics  . Alcohol use: Never  . Drug use: Never    Home Medications Prior to Admission medications   Medication Sig Start Date End Date Taking? Authorizing Provider  albuterol (VENTOLIN HFA) 108 (90 Base) MCG/ACT inhaler INHALE 1 TO 2 PUFFS INTO THE LUNGS EVERY 6 HOURS AS NEEDED FOR WHEEZING OR SHORTNESS OF BREATH  01/11/20   Valentina Shaggy, MD  amLODipine (NORVASC) 5 MG tablet Take 1 tablet (5 mg total) by mouth daily. 12/01/19 02/29/20  Minus Breeding, MD  aspirin EC 81 MG tablet Take 81 mg by mouth daily. Swallow whole.    [provider]  atorvastatin (LIPITOR) 10 MG tablet Take 10 mg by mouth daily.    [provider]  cyclobenzaprine (FLEXERIL) 10 MG tablet Take 10 mg by mouth 2 (two) times daily as needed for muscle spasms.  08/09/19   [provider]  doxycycline (VIBRA-TABS) 100 MG tablet Take 1 tablet (100 mg total) by mouth every 12 (twelve) hours. 11/28/19   Nita Sells, MD  fluticasone (FLONASE) 50 MCG/ACT nasal spray Place 2 sprays into both  nostrils daily as needed for allergies. 04/27/19   Valentina Shaggy, MD  fluticasone furoate-vilanterol (BREO ELLIPTA) 200-25 MCG/INH AEPB Inhale 1 puff into the lungs daily. 11/28/19 11/22/20  Nita Sells, MD  furosemide (LASIX) 40 MG tablet Take 1 tablet (40 mg total) by mouth daily as needed for fluid or edema (WEIGHT GAIN OF 2LBS, SHORTNESS OF BREATH). 12/01/19 02/29/20  Minus Breeding, MD  INCRUSE ELLIPTA 62.5 MCG/INH AEPB Inhale 1 puff into the lungs daily. 09/05/19   [provider]  ipratropium-albuterol (DUONEB) 0.5-2.5 (3) MG/3ML SOLN Inhale 3 mLs into the lungs every 6 (six) hours as needed. Patient taking differently: Inhale 3 mLs into the lungs every 6 (six) hours as needed (wheezing).  08/04/19   Valentina Shaggy, MD  levocetirizine Harlow Ohms) 5 MG tablet Take 1 tablet (5 mg total) by mouth every evening. 02/07/19   Valentina Shaggy, MD  losartan-hydrochlorothiazide (HYZAAR) 100-25 MG tablet Take 1 tablet by mouth daily.    [provider]  montelukast (SINGULAIR) 10 MG tablet Take 10 mg by mouth at bedtime.    [provider]  Multiple Vitamin (MULTIVITAMIN WITH MINERALS) TABS tablet Take 1 tablet by mouth daily. Centrum Multivitamin    [provider]  Vitamin D, Ergocalciferol, (DRISDOL) 1.25 MG (50000 UNIT) CAPS capsule Take 50,000 Units by mouth every Monday.  10/27/19   [provider]  zolpidem (AMBIEN) 10 MG tablet Take 10 mg by mouth at bedtime as needed for sleep.    [provider]  pantoprazole (PROTONIX) 40 MG tablet Take 1 tablet (40 mg total) by mouth daily. Patient not taking: Reported on 05/21/2019 01/13/19 06/04/19  Valentina Shaggy, MD    Allergies    Banana  Review of Systems   Review of Systems  HENT:       R facial swelling   All other systems reviewed and are negative.   Physical Exam Updated Vital Signs BP (!) 154/89 (BP Location: Left Arm)   Pulse 96   Temp 99.3 F (37.4 C)  (Oral)   Resp 18   Ht 5' 10"  (1.778 m)   Wt (!) 136.1 kg   SpO2 100%   BMI 43.05 kg/m   Physical Exam Vitals and nursing note reviewed.  Constitutional:      Comments: Uncomfortable   HENT:     Head: Normocephalic.     Nose: Nose normal.     Mouth/Throat:     Comments: Swelling of the right side of the face.  There is some cervical lymphadenopathy as well.  Patient is missing several teeth right lower premolar.  There is a cracked tooth of the right lower molar. Eyes:     Extraocular Movements: Extraocular movements intact.  Pupils: Pupils are equal, round, and reactive to light.  Cardiovascular:     Rate and Rhythm: Normal rate.     Pulses: Normal pulses.     Heart sounds: Normal heart sounds.  Pulmonary:     Effort: Pulmonary effort is normal.     Breath sounds: Normal breath sounds.  Abdominal:     General: Abdomen is flat.     Palpations: Abdomen is soft.  Musculoskeletal:        General: Normal range of motion.     Cervical back: Normal range of motion.  Lymphadenopathy:     Cervical: Cervical adenopathy present.  Skin:    General: Skin is warm.     Capillary Refill: Capillary refill takes less than 2 seconds.  Neurological:     General: No focal deficit present.     Mental Status: She is alert.  Psychiatric:        Mood and Affect: Mood normal.        Behavior: Behavior normal.     ED Results / Procedures / Treatments   Labs (all labs ordered are listed, but only abnormal results are displayed) Labs Reviewed - No data to display  EKG None  Radiology No results found.  Procedures Procedures (including critical care time)  EMERGENCY DEPARTMENT US SOFT TISSUE INTERPRETATION "Study: Limited Soft Tissue Ultrasound"  INDICATIONS: Soft tissue infection Multiple views of the body part were obtained in real-time with a multi-frequency linear probe  PERFORMED BY: Myself IMAGES ARCHIVED?: Yes SIDE:Right  BODY PART:R face INTERPRETATION:  No abcess  noted and Cellulitis present     Medications Ordered in ED Medications  amoxicillin-clavulanate (AUGMENTIN) 875-125 MG per tablet 1 tablet (has no administration in time range)    ED Course  I have reviewed the triage vital signs and the nursing notes.  Pertinent labs & imaging results that were available during my care of the patient were reviewed by me and considered in my medical decision making (see chart for details).    MDM Rules/Calculators/A&P                         Blessings Inglett is a 64 y.o. female here presenting with right facial swelling.  Patient has a right lower molar that seems to be chipped with a large cavity.  Patient has obvious swelling on the right side of the face.  I performed bedside ultrasound did not see a collectible abscess to drain.  Patient's floor of mouth is soft and nontender.  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  Will give a course of Augmentin and will give pain medicine.  Told patient to follow-up with her dentist in the morning.   Final Clinical Impression(s) / ED Diagnoses Final diagnoses:  None    Rx / DC Orders ED Discharge Orders    None       Drenda Freeze, MD 01/15/20 [phone]

## 2020-01-15 NOTE — Discharge Instructions (Addendum)
Take augmentin twice daily for a week   See your dentist tomorrow  Take motrin for pain, take vicodin for severe pain   Return to ER if you have worse dental pain, facial swelling, trouble swallowing

## 2020-01-16 ENCOUNTER — Ambulatory Visit: Payer: Medicare Other | Admitting: Allergy & Immunology

## 2020-01-17 NOTE — Progress Notes (Deleted)
Cardiology Office Note   Date:  01/18/2020   ID:  Patty Howard, DOB 1956-05-30, MRN [id number]  PCP:  System, Pcp Not In  Cardiologist:   No primary care provider on file. Referring:  System, Pcp Not In  No chief complaint on file.     History of Present Illness: Patty Howard is a 64 y.o. female who presents for evaluation of shortness of breath and chest pain. She was just hospitalized on 6/13-6/15. She has a history of asthma and COPD.  She was in the ED with possible acute diastolic HF earlier this year.  She had some atypical chest pain and I sent her for a perfusion study which was negative for ischemia. 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 She presented to the emergency room recently when she developed some increased shortness of breath. There was some chest burning associated with this. She was treated for COPD flare and was hospitalized. I reviewed these records. Chest CT didn't demonstrate fluid and her BNP was normal but she was treated for possible diastolic acute heart failure as well as a COPD flare. She was sent home with a short course of diuretic and with steroids. She feels better. However, she does report that she has been getting some chest discomfort over the last few months. Been slowly getting worse with activities. She thinks her breathing has been a little worse. She is not describing new PND or orthopnea. She is not describing new palpitations, presyncope or syncope. She had some episodes of sweating. This seems to happen when she is short of breath.  CT did demonstrate aortic atherosclerosis.      Past Medical History:  Diagnosis Date  . Anxiety   . Arthritis   . Asthma   . COPD (chronic obstructive pulmonary disease) ([place])   . Fracture    closed displaced left radial head  . Hypertension   . Sleep apnea    does not wear CPAP    Past Surgical  History:  Procedure Laterality Date  . BACK SURGERY     spinal stimulator  . COLONOSCOPY W/ BIOPSIES AND POLYPECTOMY    . DILATION AND CURETTAGE OF UTERUS    . LAPAROSCOPIC ROUX-EN-Y GASTRIC BYPASS WITH UPPER ENDOSCOPY AND REMOVAL OF LAP BAND    . RADIAL HEAD ARTHROPLASTY Left 02/03/2019   Procedure: LEFT RADIAL HEAD ARTHROPLASTY;  Surgeon: [name], MD;  Location: [facility];  Service: Orthopedics;  Laterality: Left;  AXILLARY BLOCK VS BIER BLOCK  . TUBAL LIGATION       Current Outpatient Medications  Medication Sig Dispense Refill  . albuterol (VENTOLIN HFA) 108 (90 Base) MCG/ACT inhaler INHALE 1 TO 2 PUFFS INTO THE LUNGS EVERY 6 HOURS AS NEEDED FOR WHEEZING OR SHORTNESS OF BREATH 6.7 g 0  . amLODipine (NORVASC) 5 MG tablet Take 1 tablet (5 mg total) by mouth daily. 90 tablet 3  . amoxicillin-clavulanate (AUGMENTIN) 875-125 MG tablet Take 1 tablet by mouth 2 (two) times daily. One po bid x 7 days 14 tablet 0  . aspirin EC 81 MG tablet Take 81 mg by mouth daily. Swallow whole.    Marland Kitchen atorvastatin (LIPITOR) 10 MG tablet Take 10 mg by mouth daily.    . cyclobenzaprine (FLEXERIL) 10 MG tablet Take 10 mg by mouth 2 (two) times daily as needed for muscle spasms.     Marland Kitchen  fluticasone furoate-vilanterol (BREO ELLIPTA) 200-25 MCG/INH AEPB Inhale 1 puff into the lungs daily. 30 each 11  . furosemide (LASIX) 40 MG tablet Take 1 tablet (40 mg total) by mouth daily as needed for fluid or edema (WEIGHT GAIN OF 2LBS, SHORTNESS OF BREATH). 30 tablet 3  . HYDROcodone-acetaminophen (NORCO/VICODIN) 5-325 MG tablet Take 1 tablet by mouth every 6 (six) hours as needed. 10 tablet 0  . INCRUSE ELLIPTA 62.5 MCG/INH AEPB Inhale 1 puff into the lungs daily.    Marland Kitchen ipratropium-albuterol (DUONEB) 0.5-2.5 (3) MG/3ML SOLN Inhale 3 mLs into the lungs every 6 (six) hours as needed. (Patient taking differently: Inhale 3 mLs into the lungs every 6 (six) hours as needed (wheezing). ) 360 mL 1  . levocetirizine (XYZAL) 5 MG tablet  Take 1 tablet (5 mg total) by mouth every evening. 90 tablet 1  . losartan-hydrochlorothiazide (HYZAAR) 100-25 MG tablet Take 1 tablet by mouth daily.    . montelukast (SINGULAIR) 10 MG tablet Take 10 mg by mouth at bedtime.    . Multiple Vitamin (MULTIVITAMIN WITH MINERALS) TABS tablet Take 1 tablet by mouth daily. Centrum Multivitamin    . Vitamin D, Ergocalciferol, (DRISDOL) 1.25 MG (50000 UNIT) CAPS capsule Take 50,000 Units by mouth every Monday.     . zolpidem (AMBIEN) 10 MG tablet Take 10 mg by mouth at bedtime as needed for sleep.     Current Facility-Administered Medications  Medication Dose Route Frequency Provider Last Rate Last Admin  . Benralizumab SOSY 30 mg  30 mg Subcutaneous Q8 Toney Reil, MD   30 mg at 01/02/20 1104    Allergies:   Banana    ROS:  Please see the history of present illness.   Otherwise, review of systems are positive for none.   All other systems are reviewed and negative.    PHYSICAL EXAM: VS:  There were no vitals taken for this visit. , BMI There is no height or weight on file to calculate BMI. GENERAL:  Well appearing NECK:  No jugular venous distention, waveform within normal limits, carotid upstroke brisk and symmetric, no bruits, no thyromegaly LUNGS:  Clear to auscultation bilaterally CHEST:  Unremarkable HEART:  PMI not displaced or sustained,S1 and S2 within normal limits, no S3, no S4, no clicks, no rubs, *** murmurs ABD:  Flat, positive bowel sounds normal in frequency in pitch, no bruits, no rebound, no guarding, no midline pulsatile mass, no hepatomegaly, no splenomegaly EXT:  2 plus pulses throughout, no edema, no cyanosis no clubbing    ***GENERAL:  Well appearing HEENT:  Pupils equal round and reactive, fundi not visualized, oral mucosa unremarkable NECK:  No jugular venous distention, waveform within normal limits, carotid upstroke brisk and symmetric, no bruits, no thyromegaly LYMPHATICS:  No cervical, inguinal  adenopathy LUNGS: Few scattered wheezes BACK:  No CVA tenderness CHEST:  Unremarkable HEART:  PMI not displaced or sustained,S1 and S2 within normal limits, no S3, no S4, no clicks, no rubs, no murmurs ABD:  Flat, positive bowel sounds normal in frequency in pitch, no bruits, no rebound, no guarding, no midline pulsatile mass, no hepatomegaly, no splenomegaly EXT:  2 plus pulses throughout, no edema, no cyanosis no clubbing SKIN:  No rashes no nodules NEURO:  Cranial nerves II through XII grossly intact, motor grossly intact throughout PSYCH:  Cognitively intact, oriented to person place and time    EKG:  EKG is *** ordered today. The ekg ordered today demonstrates sinus rhythm, rate ***, axis  within normal limits, intervals within normal limits, nonspecific ST-T wave changes.   Recent Labs: 05/22/2019: ALT 18 06/03/2019: Magnesium 1.9 11/27/2019: B Natriuretic Peptide 51.6 11/28/2019: BUN 22; Creatinine, Ser 0.89; Hemoglobin 10.7; Platelets 289; Potassium 3.9; Sodium 138    Lipid Panel No results found for: CHOL, TRIG, HDL, CHOLHDL, VLDL, LDLCALC, LDLDIRECT    Wt Readings from Last 3 Encounters:  01/14/20 (!) 300 lb (136.1 kg)  01/08/20 (!) 304 lb 3.2 oz (138 kg)  12/19/19 297 lb (134.7 kg)      Other studies Reviewed: Additional studies/ records that were reviewed today include: *** Review of the above records demonstrates:  Please see elsewhere in the note.     ASSESSMENT AND PLAN:  CHEST PAIN: ***  Her chest pain is somewhat atypical. However, she has significant cardiovascular risk factors. She would be able to walk on a treadmill. I'm going to screen her with a Lexiscan Myoview. Further evaluation will be based on this.  SOB:  ***   I think she has acute diastolic heart failure. We talked about salt restriction. We talked about fluid management. She'll also be managed with blood pressure control as below. I will give her as needed Lasix as she is out of the dose that  she was sent home with.  HTN:  ***  We talked at length about weight management. I'm to add amlodipine 5 mg daily.  OBESITY:   ***  As above. I suggested the Mediterranean diet and a plan for weight loss and activity.  COVID EDUCATION: She has had her vaccinations.   Current medicines are reviewed at length with the patient today.  The patient does not have concerns regarding medicines.  The following changes have been made:  no change  Labs/ tests ordered today include:   No orders of the defined types were placed in this encounter.    Disposition:   FU with me in six weeks.     Signed, Minus Breeding, MD  01/18/2020 10:57 AM    [facility]

## 2020-01-18 ENCOUNTER — Ambulatory Visit: Payer: Medicare Other | Admitting: Cardiology

## 2020-01-18 ENCOUNTER — Ambulatory Visit (INDEPENDENT_AMBULATORY_CARE_PROVIDER_SITE_OTHER): Payer: Medicare Other | Admitting: Allergy & Immunology

## 2020-01-18 ENCOUNTER — Other Ambulatory Visit: Payer: Self-pay

## 2020-01-18 ENCOUNTER — Encounter: Payer: Self-pay | Admitting: Allergy & Immunology

## 2020-01-18 ENCOUNTER — Telehealth: Payer: Self-pay | Admitting: Pulmonary Disease

## 2020-01-18 VITALS — BP 122/68 | HR 99 | Temp 97.8°F | Resp 20

## 2020-01-18 DIAGNOSIS — J3089 Other allergic rhinitis: Secondary | ICD-10-CM | POA: Diagnosis not present

## 2020-01-18 DIAGNOSIS — K219 Gastro-esophageal reflux disease without esophagitis: Secondary | ICD-10-CM

## 2020-01-18 DIAGNOSIS — K047 Periapical abscess without sinus: Secondary | ICD-10-CM | POA: Insufficient documentation

## 2020-01-18 DIAGNOSIS — J449 Chronic obstructive pulmonary disease, unspecified: Secondary | ICD-10-CM

## 2020-01-18 DIAGNOSIS — J4550 Severe persistent asthma, uncomplicated: Secondary | ICD-10-CM | POA: Diagnosis not present

## 2020-01-18 MED ORDER — IPRATROPIUM-ALBUTEROL 0.5-2.5 (3) MG/3ML IN SOLN: 3.0000 mL | Freq: Four times a day (QID) | RESPIRATORY_TRACT | 1 refills | Status: DC | PRN

## 2020-01-18 MED ORDER — ALBUTEROL SULFATE HFA 108 (90 BASE) MCG/ACT IN AERS: INHALATION_SPRAY | RESPIRATORY_TRACT | 1 refills | Status: DC

## 2020-01-18 NOTE — Telephone Encounter (Signed)
LMTCB x 1 

## 2020-01-18 NOTE — Progress Notes (Signed)
FOLLOW UP  Date of Service/Encounter:  01/18/20   Assessment:   Asthma-COPD overlap syndrome  Perennial and seasonal allergicrhinitis (dust mites, molds)  Gastroesophageal reflux disease  Morbid obesity   Patty Howard presents for follow-up visit. It seems that she never got her medications changed to full nebulized medications as recommended at the last visit. This seems to been secondary to an issue with insurance coverage. Regardless, she reports doing well with Breo and Incruse together as well as Singulair. During the discussion, she does report that she has had around 10 courses of systemic steroids since moving to [place] early last year. While this is better than they reported 20 courses of steroids as she told me she got during her last year in [place], I still think we can do better. Therefore, we will submit for Xolair approval. I think that will be the next easiest to get approved with her insurance. She has never had an elevated eosinophil count, therefore getting mepolizumab approved would be difficult. Dupixent does have a steroid sparing indication, so we could do that one in the future if needed. She might even be 1 of these rare patients that requires two biologics. She does bring up the idea of allergy shots, but she lacks allergic rhinitis symptoms. While allergy shots can help with breathing, it is a fairly long-term commitment and would take a while for her to build up. I'm also not entirely sure that it's safe with her lung function in the 50% range.  Plan/Recommendations:   1. Severe persistent asthma, uncomplicated - Lung testing looked stable and there was some improvement with the albuterol puffs. - It is clear that Patty Howard has not been working as anticipated. - We are going to stop the Fasenra and change to Patty Howard will be reaching out to you to discuss this once we have approval.  - Daily controller medication(s): Breo 200/25 one puff  once daily + Incruse one puff once daily + Singulair 81m daily + DuoNeb nebulizer twice daily - Prior to physical activity: albuterol 2 puffs 10-15 minutes before physical activity. - Rescue medications: albuterol 4 puffs every 4-6 hours as needed, albuterol nebulizer one vial every 4-6 hours as needed or DuoNeb nebulizer one vial every 4-6 hours as needed - Asthma control goals:  * Full participation in all desired activities (may need albuterol before activity) * Albuterol use two time or less a week on average (not counting use with activity) * Cough interfering with sleep two time or less a month * Oral steroids no more than once a year * No hospitalizations  2. Chronic rhinitis (dust mites, molds) - Continue with Xyzal (levocetirizine) 5110mdaily. - Continue with Flonase one spray per nostril twice daily to control any postnasal drip.  - We could consider allergy shots in the future, but I am not convinced that they will help with the breathing as much.   3. Gastroesophageal reflux disease - Continue with Protonix 4085mnce daily to control any silent reflux.  4. Return in about 4 weeks (around 02/15/2020). This can be an in-person, a virtual Webex or a telephone follow up visit.   Subjective:   Patty Howard a 63 57o. female presenting today for follow up of  Chief Complaint  Patient presents with  . Follow-up  . Shortness of Breath    Patty Howard a history of the following: Patient Active Problem List   Diagnosis Date Noted  . Abnormal findings on  diagnostic imaging of lung 01/08/2020  . Educated about COVID-19 virus infection 11/30/2019  . Aortic atherosclerosis ([facility]) 11/30/2019  . Chest pain 11/26/2019  . Sleep apnea   . Acute asthma exacerbation 05/22/2019  . Left radial head fracture 02/03/2019  . Closed fracture of radial head 01/29/2019  . Prediabetes 10/19/2018  . Hypertension, essential, benign 10/19/2018  . Asthma with COPD (chronic  obstructive pulmonary disease) ([facility]) 10/19/2018  . Insomnia 10/19/2018  . Elbow pain, chronic, left 10/19/2018  . Asthma 05/29/2018  . COPD exacerbation ([facility]) 04/05/2018  . Dyspnea 10/12/2017  . Asthma attack 07/11/2017  . Pancolitis ([place]) 05/20/2017  . H/O Spinal surgery 01/18/2015  . HTN (hypertension) 01/18/2015  . Obesity 01/18/2015    History obtained from: chart review and patient.  Patty Howard is a 64 y.o. female presenting for a follow up visit.  She was last seen in May 2021.  At that time, her lung testing looks stable.  We decided to order full pulmonary function testing and a chest CT.  We restarted all of her nebulized medications including Pulmicort twice daily, Perforomist twice daily, and Yupelri daily in combination with Fasenra every 8 weeks.  For her rhinitis, would continue with Xyzal as well as Flonase twice daily.  For her reflux, we continue with Protonix 40 mg daily.  Since last visit, she has mostly done well.  She did go to the emergency room on August 1 for facial swelling which she felt was secondary to a dental procedure.  She also was admitted in June 2021 with a COPD exacerbation.  She was discharged on Breo at that time as well as formoterol twice daily. She is followed by Patty Howard and Patty Howard.   At her last visit with pulmonology at the end of July, she was continued on Breo 229mg.  She did have a chest CT that demonstrated the following: 1. Mild left upper lobe and bibasilar scarring and/or atelectasis. 2. Evidence of prior gastric banding surgery. 3. Aortic atherosclerosis.  Asthma/Respiratory Symptom History: Now she is on her albuterol as needed. She is on [place] Incruse. She also uses the albuterol nebulizer machine. She is also on Singulair. These are the main ones that she uses. Pulmicort does not sound familiar. She likes Perforomist but it is too expensive. She did use some samples from [place] She does not think Yupelri sounds familiar. She is  compliant with the Breo and the Incruse. She does use it regularly but does continue to have shortness of breath. She is using the albuterol MDI once per day. She uses the albuterol twice daily. She does not think that the FBerna Buehas done much, although it should be noted that she told me that she had had 20 rounds [place]nisone [place] She has been on steroids a number of times over the last few years. Even in the last [place] estimates that she has had 10 rounds of antibiotics, sometimes for asthma/COPD exacerbations and sometimes for upper respiratory infections.  Allergic Rhinitis Symptom History: She is on the Singulair for her alleriges. She doesn ot use any nose sprays since she does not like it at all. She does not use it regularly. She is wondering about allergy shots and whether they would help with her breathing.  She did receive both of the COVID-19 vaccines in May. She tolerated them fairly well. She has her wisdom tooth pulled last week and she developed an abscess  there. She was placed on Augmentin in the ED a few days later. This was a "big balloon". She did not give prednisone for this. Overall, this is improving.  Otherwise, there have been no changes to her past medical history, surgical history, family history, or social history.    Review of Systems  Constitutional: Negative.  Negative for chills, fever, malaise/fatigue and weight loss.  HENT: Positive for congestion and sinus pain. Negative for ear discharge and ear pain.   Eyes: Negative for pain, discharge and redness.  Respiratory: Positive for cough and wheezing. Negative for sputum production and shortness of breath.   Cardiovascular: Negative.  Negative for chest pain and palpitations.  Gastrointestinal: Negative for abdominal pain, constipation, diarrhea, heartburn, nausea and vomiting.  Skin: Negative.  Negative for itching and rash.  Neurological: Negative for  dizziness and headaches.  Endo/Heme/Allergies: Positive for environmental allergies. Does not bruise/bleed easily.       Objective:   Blood pressure 122/68, pulse 99, temperature 97.8 F (36.6 C), temperature source Temporal, resp. rate 20, SpO2 96 %. There is no height or weight on file to calculate BMI.   Physical Exam:  Physical Exam Constitutional:      Appearance: She is well-developed.     Comments: Obese female. Talkative and appreciative.  HENT:     Head: Normocephalic and atraumatic.     Right Ear: Tympanic membrane, ear canal and external ear normal.     Left Ear: Tympanic membrane, ear canal and external ear normal.     Nose: No nasal deformity, septal deviation, mucosal edema or rhinorrhea.     Right Turbinates: Enlarged and swollen.     Left Turbinates: Enlarged and swollen.     Right Sinus: No maxillary sinus tenderness or frontal sinus tenderness.     Left Sinus: No maxillary sinus tenderness or frontal sinus tenderness.     Comments: No polyps appreciated.     Mouth/Throat:     Mouth: Mucous membranes are not pale and not dry.     Pharynx: Uvula midline.     Comments: Cobblestoning present in the posterior oropharynx.  Eyes:     General:        Right eye: No discharge.        Left eye: No discharge.     Conjunctiva/sclera: Conjunctivae normal.     Right eye: Right conjunctiva is not injected. No chemosis.    Left eye: Left conjunctiva is not injected. No chemosis.    Pupils: Pupils are equal, round, and reactive to light.  Cardiovascular:     Rate and Rhythm: Normal rate and regular rhythm.     Heart sounds: Normal heart sounds.  Pulmonary:     Effort: Pulmonary effort is normal. No tachypnea, accessory muscle usage or respiratory distress.     Breath sounds: Wheezing and rhonchi present. No rales.     Comments: Scattered wheezing. Breathing fairly comfortably. No crackles at all. There is somewhat decreased air movement at the bases.  Chest:     Chest  wall: No tenderness.  Lymphadenopathy:     Cervical: No cervical adenopathy.  Skin:    Coloration: Skin is not pale.     Findings: No abrasion, erythema, petechiae or rash. Rash is not papular, urticarial or vesicular.  Neurological:     Mental Status: She is alert.  Psychiatric:        Behavior: Behavior is cooperative.      Diagnostic studies:    Spirometry: results abnormal (FEV1:  1.18/46%, FVC: 1.52/47%, FEV1/FVC: 78%).    Spirometry consistent with possible restrictive disease. Xopenex four puffs via MDI treatment given in clinic with improvement in FEV1 and FVC, but not significant per ATS criteria. The FEV1 increased 11% while the FVC increased 14%.   Allergy Studies: none       Salvatore Marvel, MD  [facility]

## 2020-01-18 NOTE — Patient Instructions (Addendum)
1. Severe persistent asthma, uncomplicated - Lung testing looked stable and there was some improvement with the albuterol puffs. - It is clear that Patty Howard has not been working as anticipated. - We are going to stop the Fasenra and change to [place] will be reaching out to you to discuss this once we have approval.  - Daily controller medication(s): Breo 200/25 one puff once daily + Incruse one puff once daily + Singulair 43m daily + DuoNeb nebulizer twice daily - Prior to physical activity: albuterol 2 puffs 10-15 minutes before physical activity. - Rescue medications: albuterol 4 puffs every 4-6 hours as needed, albuterol nebulizer one vial every 4-6 hours as needed or DuoNeb nebulizer one vial every 4-6 hours as needed - Asthma control goals:  * Full participation in all desired activities (may need albuterol before activity) * Albuterol use two time or less a week on average (not counting use with activity) * Cough interfering with sleep two time or less a month * Oral steroids no more than once a year * No hospitalizations  2. Chronic rhinitis (dust mites, molds) - Continue with Xyzal (levocetirizine) 515mdaily. - Continue with Flonase one spray per nostril twice daily to control any postnasal drip.  - We could consider allergy shots in the future, but I am not convinced that they will help with the breathing as much.   3. Gastroesophageal reflux disease - Continue with Protonix 4081mnce daily to control any silent reflux.  4. Return in about 4 weeks (around 02/15/2020). This can be an in-person, a virtual Webex or a telephone follow up visit.   Please inform us [place] any Emergency Department visits, hospitalizations, or changes in symptoms. Call us [place] going to the ED for breathing or allergy symptoms since we might be able to fit you in for a sick visit. Feel free to contact us [place] with any questions, problems, or concerns.  It was a pleasure to see you again  today!  Websites that have reliable patient information: 1. American Academy of Asthma, Allergy, and Immunology: www.aaaai.org 2. Food Allergy Research and Education (FARE): foodallergy.org 3. Mothers of Asthmatics: http://www.asthmacommunitynetwork.org 4. American College of Allergy, Asthma, and Immunology: www.acaai.org   COVID-19 Vaccine Information can be found at: [email or web address] questions related to vaccine distribution or appointments, please email [email or web address] or call [phone]   "Like" us [place] Facebook and Instagram for our latest updates!        Make sure you are registered to vote! If you have moved or changed any of your contact information, you will need to get this updated before voting!  In some cases, you MAY be able to register to vote online: [email or web address]

## 2020-01-19 NOTE — Telephone Encounter (Signed)
LMTCB x2 for pt 

## 2020-01-22 NOTE — Telephone Encounter (Signed)
LMTCB x3 for pt. We have attempted to contact pt several times with no success or call back from pt. Per triage protocol, message will be closed.   

## 2020-01-27 NOTE — Progress Notes (Deleted)
Cardiology Office Note   Date:  01/27/2020   ID:  Patty Howard, DOB 1956/01/16, MRN [id number]  PCP:  System, Pcp Not In  Cardiologist:   No primary care provider on file. Referring:  System, Pcp Not In  No chief complaint on file.     History of Present Illness: Patty Howard is a 64 y.o. female who presents for evaluation of shortness of breath and chest pain. She was just hospitalized on 6/13-6/15. She has a history of asthma and COPD.  She was in the ED with possible acute diastolic HF earlier this year.  She had some atypical chest pain and I sent her for a perfusion study which was negative for ischemia. 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. She presented to the emergency room recently when she developed some increased shortness of breath. There was some chest burning associated with this. She was treated for COPD flare and was hospitalized. I reviewed these records. Chest CT didn't demonstrate fluid and her BNP was normal but she was treated for possible diastolic acute heart failure as well as a COPD flare. She was sent home with a short course of diuretic and with steroids. She feels better. However, she does report that she has been getting some chest discomfort over the last few months. Been slowly getting worse with activities. She thinks her breathing has been a little worse. She is not describing new PND or orthopnea. She is not describing new palpitations, presyncope or syncope. She had some episodes of sweating. This seems to happen when she is short of breath.  CT did demonstrate aortic atherosclerosis.      Past Medical History:  Diagnosis Date  . Anxiety   . Arthritis   . Asthma   . COPD (chronic obstructive pulmonary disease) ([place])   . Fracture    closed displaced left radial head  . Hypertension   . Sleep apnea    does not wear CPAP    Past Surgical  History:  Procedure Laterality Date  . BACK SURGERY     spinal stimulator  . COLONOSCOPY W/ BIOPSIES AND POLYPECTOMY    . DILATION AND CURETTAGE OF UTERUS    . LAPAROSCOPIC ROUX-EN-Y GASTRIC BYPASS WITH UPPER ENDOSCOPY AND REMOVAL OF LAP BAND    . RADIAL HEAD ARTHROPLASTY Left 02/03/2019   Procedure: LEFT RADIAL HEAD ARTHROPLASTY;  Surgeon: [name], MD;  Location: [facility];  Service: Orthopedics;  Laterality: Left;  AXILLARY BLOCK VS BIER BLOCK  . TUBAL LIGATION       Current Outpatient Medications  Medication Sig Dispense Refill  . albuterol (VENTOLIN HFA) 108 (90 Base) MCG/ACT inhaler INHALE 1 TO 2 PUFFS INTO THE LUNGS EVERY 6 HOURS AS NEEDED FOR WHEEZING OR SHORTNESS OF BREATH 6.7 g 1  . amLODipine (NORVASC) 5 MG tablet Take 1 tablet (5 mg total) by mouth daily. 90 tablet 3  . amoxicillin-clavulanate (AUGMENTIN) 875-125 MG tablet Take 1 tablet by mouth 2 (two) times daily. One po bid x 7 days 14 tablet 0  . aspirin EC 81 MG tablet Take 81 mg by mouth daily. Swallow whole.    Marland Kitchen atorvastatin (LIPITOR) 10 MG tablet Take 10 mg by mouth daily.    . cyclobenzaprine (FLEXERIL) 10 MG tablet Take 10 mg by mouth 2 (two) times daily as needed for muscle spasms.     Marland Kitchen  fluticasone furoate-vilanterol (BREO ELLIPTA) 200-25 MCG/INH AEPB Inhale 1 puff into the lungs daily. 30 each 11  . furosemide (LASIX) 40 MG tablet Take 1 tablet (40 mg total) by mouth daily as needed for fluid or edema (WEIGHT GAIN OF 2LBS, SHORTNESS OF BREATH). 30 tablet 3  . HYDROcodone-acetaminophen (NORCO/VICODIN) 5-325 MG tablet Take 1 tablet by mouth every 6 (six) hours as needed. 10 tablet 0  . INCRUSE ELLIPTA 62.5 MCG/INH AEPB Inhale 1 puff into the lungs daily.    Marland Kitchen ipratropium-albuterol (DUONEB) 0.5-2.5 (3) MG/3ML SOLN Inhale 3 mLs into the lungs every 6 (six) hours as needed (wheezing). 3 mL 1  . levocetirizine (XYZAL) 5 MG tablet Take 1 tablet (5 mg total) by mouth every evening. 90 tablet 1  .  losartan-hydrochlorothiazide (HYZAAR) 100-25 MG tablet Take 1 tablet by mouth daily.    . montelukast (SINGULAIR) 10 MG tablet Take 10 mg by mouth at bedtime.    . Multiple Vitamin (MULTIVITAMIN WITH MINERALS) TABS tablet Take 1 tablet by mouth daily. Centrum Multivitamin    . Vitamin D, Ergocalciferol, (DRISDOL) 1.25 MG (50000 UNIT) CAPS capsule Take 50,000 Units by mouth every Monday.     . zolpidem (AMBIEN) 10 MG tablet Take 10 mg by mouth at bedtime as needed for sleep.     Current Facility-Administered Medications  Medication Dose Route Frequency Provider Last Rate Last Admin  . Benralizumab SOSY 30 mg  30 mg Subcutaneous Q8 Toney Reil, MD   30 mg at 01/02/20 1104    Allergies:   Banana    ROS:  Please see the history of present illness.   Otherwise, review of systems are positive for none.   All other systems are reviewed and negative.    PHYSICAL EXAM: VS:  There were no vitals taken for this visit. , BMI There is no height or weight on file to calculate BMI. GENERAL:  Well appearing NECK:  No jugular venous distention, waveform within normal limits, carotid upstroke brisk and symmetric, no bruits, no thyromegaly LUNGS:  Clear to auscultation bilaterally CHEST:  Unremarkable HEART:  PMI not displaced or sustained,S1 and S2 within normal limits, no S3, no S4, no clicks, no rubs, *** murmurs ABD:  Flat, positive bowel sounds normal in frequency in pitch, no bruits, no rebound, no guarding, no midline pulsatile mass, no hepatomegaly, no splenomegaly EXT:  2 plus pulses throughout, no edema, no cyanosis no clubbing    ***GENERAL:  Well appearing HEENT:  Pupils equal round and reactive, fundi not visualized, oral mucosa unremarkable NECK:  No jugular venous distention, waveform within normal limits, carotid upstroke brisk and symmetric, no bruits, no thyromegaly LYMPHATICS:  No cervical, inguinal adenopathy LUNGS: Few scattered wheezes BACK:  No CVA  tenderness CHEST:  Unremarkable HEART:  PMI not displaced or sustained,S1 and S2 within normal limits, no S3, no S4, no clicks, no rubs, no murmurs ABD:  Flat, positive bowel sounds normal in frequency in pitch, no bruits, no rebound, no guarding, no midline pulsatile mass, no hepatomegaly, no splenomegaly EXT:  2 plus pulses throughout, no edema, no cyanosis no clubbing SKIN:  No rashes no nodules NEURO:  Cranial nerves II through XII grossly intact, motor grossly intact throughout PSYCH:  Cognitively intact, oriented to person place and time    EKG:  EKG is *** ordered today. The ekg ordered today demonstrates sinus rhythm, rate ***, axis within normal limits, intervals within normal limits, nonspecific ST-T wave changes.   Recent Labs: 05/22/2019:  ALT 18 06/03/2019: Magnesium 1.9 11/27/2019: B Natriuretic Peptide 51.6 11/28/2019: BUN 22; Creatinine, Ser 0.89; Hemoglobin 10.7; Platelets 289; Potassium 3.9; Sodium 138    Lipid Panel No results found for: CHOL, TRIG, HDL, CHOLHDL, VLDL, LDLCALC, LDLDIRECT    Wt Readings from Last 3 Encounters:  01/14/20 (!) 300 lb (136.1 kg)  01/08/20 (!) 304 lb 3.2 oz (138 kg)  12/19/19 297 lb (134.7 kg)      Other studies Reviewed: Additional studies/ records that were reviewed today include: *** Review of the above records demonstrates:  Please see elsewhere in the note.     ASSESSMENT AND PLAN:  CHEST PAIN: ***  Her chest pain is somewhat atypical. However, she has significant cardiovascular risk factors. She would be able to walk on a treadmill. I'm going to screen her with a Lexiscan Myoview. Further evaluation will be based on this.  SOB:  ***   I think she has acute diastolic heart failure. We talked about salt restriction. We talked about fluid management. She'll also be managed with blood pressure control as below. I will give her as needed Lasix as she is out of the dose that she was sent home with.  HTN:  ***  We talked at length  about weight management. I'm to add amlodipine 5 mg daily.  OBESITY:   ***  As above. I suggested the Mediterranean diet and a plan for weight loss and activity.  COVID EDUCATION: She has had her vaccinations.   Current medicines are reviewed at length with the patient today.  The patient does not have concerns regarding medicines.  The following changes have been made:  ***  Labs/ tests ordered today include: ***  No orders of the defined types were placed in this encounter.    Disposition:   FU with me in *** weeks.     Signed, Minus Breeding, MD  01/27/2020 12:58 PM    [facility]

## 2020-01-29 ENCOUNTER — Ambulatory Visit: Payer: Medicare Other | Admitting: Cardiology

## 2020-02-08 ENCOUNTER — Ambulatory Visit: Payer: Medicare Other | Admitting: Pulmonary Disease

## 2020-02-08 ENCOUNTER — Encounter (HOSPITAL_BASED_OUTPATIENT_CLINIC_OR_DEPARTMENT_OTHER): Payer: Medicare Other | Admitting: Pulmonary Disease

## 2020-02-13 ENCOUNTER — Other Ambulatory Visit: Payer: Self-pay

## 2020-02-13 ENCOUNTER — Ambulatory Visit (INDEPENDENT_AMBULATORY_CARE_PROVIDER_SITE_OTHER): Payer: Medicare Other | Admitting: Allergy & Immunology

## 2020-02-13 VITALS — BP 136/72 | HR 102 | Temp 98.0°F | Resp 18 | Ht 70.0 in | Wt 306.4 lb

## 2020-02-13 DIAGNOSIS — J4489 Other specified chronic obstructive pulmonary disease: Secondary | ICD-10-CM

## 2020-02-13 DIAGNOSIS — J449 Chronic obstructive pulmonary disease, unspecified: Secondary | ICD-10-CM | POA: Diagnosis not present

## 2020-02-13 DIAGNOSIS — J3089 Other allergic rhinitis: Secondary | ICD-10-CM

## 2020-02-13 DIAGNOSIS — K219 Gastro-esophageal reflux disease without esophagitis: Secondary | ICD-10-CM | POA: Diagnosis not present

## 2020-02-13 MED ORDER — MONTELUKAST SODIUM 10 MG PO TABS: 10.0000 mg | ORAL_TABLET | Freq: Every day | ORAL | 5 refills | Status: DC

## 2020-02-13 MED ORDER — TRELEGY ELLIPTA 200-62.5-25 MCG/INH IN AEPB: 1.0000 | INHALATION_SPRAY | Freq: Every day | RESPIRATORY_TRACT | 5 refills | Status: DC

## 2020-02-13 NOTE — Patient Instructions (Addendum)
1. Severe persistent asthma, uncomplicated - Lung testing looked slightly better today. - We will give you a prednisone course to have on hand when you are traveling if needed.  - Stop the Breo and the Incruse and start TRELEGY one puff once daily (contains those three classes of medications).  - Daily controller medication(s): Trelegy 230mg one puff once daily + Singulair 126mdaily + DuoNeb nebulizer twice daily + Fasenra every 8 months - Prior to physical activity: albuterol 2 puffs 10-15 minutes before physical activity. - Rescue medications: albuterol 4 puffs every 4-6 hours as needed, albuterol nebulizer one vial every 4-6 hours as needed or DuoNeb nebulizer one vial every 4-6 hours as needed - Asthma control goals:  * Full participation in all desired activities (may need albuterol before activity) * Albuterol use two time or less a week on average (not counting use with activity) * Cough interfering with sleep two time or less a month * Oral steroids no more than once a year * No hospitalizations  2. Chronic rhinitis (dust mites, molds) - Continue with Flonase one spray per nostril twice daily to  - Continue with Xyzal (levocetirizine) 39m24maily.control any postnasal drip.   3. Gastroesophageal reflux disease - Continue with Protonix 81m42mce daily to control any silent reflux.  4. Return in about 2 months (around 04/14/2020). This can be an in-person, a virtual Webex or a telephone follow up visit.   Please inform us o[place]any Emergency Department visits, hospitalizations, or changes in symptoms. Call us bKoreaore going to the ED for breathing or allergy symptoms since we might be able to fit you in for a sick visit. Feel free to contact us aKoreatime with any questions, problems, or concerns.  It was a pleasure to see you again today!  Websites that have reliable patient information: 1. American Academy of Asthma, Allergy, and Immunology: www.aaaai.org 2. Food Allergy Research and  Education (FARE): foodallergy.org 3. Mothers of Asthmatics: http://www.asthmacommunitynetwork.org 4. American College of Allergy, Asthma, and Immunology: www.acaai.org   COVID-19 Vaccine Information can be found at: [email or web address] questions related to vaccine distribution or appointments, please email [email or web address] or call [phone]  "Like" us oKoreaFacebook and Instagram for our latest updates!        Make sure you are registered to vote! If you have moved or changed any of your contact information, you will need to get this updated before voting!  In some cases, you MAY be able to register to vote online: [email or web address]

## 2020-02-13 NOTE — Progress Notes (Signed)
FOLLOW UP  Date of Service/Encounter:  02/13/20   Assessment:   Asthma-COPD overlap syndrome -with spirometry consistently in the 50% range  Perennial and seasonal allergicrhinitis (dust mites, molds)  Gastroesophageal reflux disease  Morbid obesity   Plan/Recommendations:   1. Severe persistent asthma, uncomplicated - Lung testing looked slightly better today. - We will give you a prednisone course to have on hand when you are traveling if needed.  - Stop the Breo and the Incruse and start TRELEGY one puff once daily (contains those three classes of medications).  - Daily controller medication(s): Trelegy 24mg one puff once daily + Singulair 1419mdaily + DuoNeb nebulizer twice daily + Fasenra every 8 months - Prior to physical activity: albuterol 2 puffs 10-15 minutes before physical activity. - Rescue medications: albuterol 4 puffs every 4-6 hours as needed, albuterol nebulizer one vial every 4-6 hours as needed or DuoNeb nebulizer one vial every 4-6 hours as needed - Asthma control goals:  * Full participation in all desired activities (may need albuterol before activity) * Albuterol use two time or less a week on average (not counting use with activity) * Cough interfering with sleep two time or less a month * Oral steroids no more than once a year * No hospitalizations  2. Chronic rhinitis (dust mites, molds) - Continue with Flonase one spray per nostril twice daily to  - Continue with Xyzal (levocetirizine) 19m86maily.control any postnasal drip.   3. Gastroesophageal reflux disease - Continue with Protonix 28m47mce daily to control any silent reflux.  4. Return in about 2 months (around 04/14/2020). This can be an in-person, a virtual Webex or a telephone follow up visit.  Subjective:   Patty Howard 64 y24. female presenting today for follow up of  Chief Complaint  Patient presents with  . Asthma    Patty Howard a history of  the following: Patient Active Problem List   Diagnosis Date Noted  . Abnormal findings on diagnostic imaging of lung 01/08/2020  . Educated about COVID-19 virus infection 11/30/2019  . Aortic atherosclerosis (HCC)[place]/17/2021  . Chest pain 11/26/2019  . Sleep apnea   . Acute asthma exacerbation 05/22/2019  . Left radial head fracture 02/03/2019  . Closed fracture of radial head 01/29/2019  . Prediabetes 10/19/2018  . Hypertension, essential, benign 10/19/2018  . 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) (HCC)[place]/11/2018  . Insomnia 10/19/2018  . Elbow pain, chronic, left 10/19/2018  . Asthma 05/29/2018  . COPD exacerbation (HCC)[place]/22/2019  . Dyspnea 10/12/2017  . Asthma attack 07/11/2017  . Pancolitis (HCC)[place]/11/2016  . H/O Spinal surgery 01/18/2015  . HTN (hypertension) 01/18/2015  . Obesity 01/18/2015     History obtained from: chart review and patient.  Patty Howard 64 y42. female presenting for a follow up visit. She was last seen in August 2021 around 1 month ago. At that time, her lung testing looks stable and there are some improvement with albuterol. Unfortunately we decided that FaseBerna Bue not been working because of all the prednisone that she had been getting. We decided to change to Xolair. We continued Breo 200/25 mcg 1 puff once daily in combination with Incruse 1 puff once daily and Singulair as well as duo nebs twice daily. For her rhinitis, we will continue with Xyzal as well as Flonase. Her GERD was under control with Protonix 40 mg once daily.  Since the last visit, she has been the same. She had her  last Berna Bue in July.  She has not had steroids at all since the last visit, but this has only been one month. She has been losing weight and changing her diet. This might have helped to improve her breathing. She is going to be in [place] from September 9th through October 9th.   Asthma/Respiratory Symptom History: She remains on the Breo and the  Incruse. She has been doing the DuoNebs four times daily. She has been on the four times daily for one month. This seems to be doing something, but when she gets up in the morning, she will need to "get on the machine" meaning the nebulizer machine. She has another appointment for [facility] in September.  Not sure where we are on the Xolair approval process, but I will check with Tammy today. Overall, she continues to think that the Berna Bue is helping, as she was on prednisone 1-2 times a month when she lived in [place].  Allergic Rhinitis Symptom History: She remains on Xyzal as well as Flonase.  She has not required any antibiotics since last visit.  She denies any sinusitis symptoms.   She is on HCTZ as part of her hypertension medicine. She reports a lot of edema.  She has not Dr. PCP about this edema on her lower extremities.  However, she will bring it up.  She also shares today that she is going to be going to [place] and staying there for a month with her son.  Otherwise, there have been no changes to her past medical history, surgical history, family history, or social history.    Review of Systems  Constitutional: Negative.  Negative for chills, fever, malaise/fatigue and weight loss.  HENT: Negative.  Negative for congestion, ear discharge and ear pain.   Eyes: Negative for pain, discharge and redness.  Respiratory: Positive for cough and shortness of breath. Negative for sputum production and wheezing.   Cardiovascular: Negative.  Negative for chest pain and palpitations.  Gastrointestinal: Negative for abdominal pain, constipation, diarrhea, heartburn, nausea and vomiting.  Skin: Negative.  Negative for itching and rash.  Neurological: Negative for dizziness and headaches.  Endo/Heme/Allergies: Negative for environmental allergies. Does not bruise/bleed easily.       Objective:   Blood pressure 136/72, pulse (!) 102, temperature 98 F (36.7 C), temperature source Temporal,  resp. rate 18, height 5' 10"  (1.778 m), weight (!) 306 lb 6.4 oz (139 kg), SpO2 97 %. Body mass index is 43.96 kg/m.   Physical Exam:  Physical Exam Constitutional:      Appearance: She is well-developed.     Comments: Very appreciative.  HENT:     Head: Normocephalic and atraumatic.     Right Ear: Tympanic membrane, ear canal and external ear normal.     Left Ear: Tympanic membrane, ear canal and external ear normal.     Nose: No nasal deformity, septal deviation, mucosal edema or rhinorrhea.     Right Turbinates: Enlarged and swollen.     Left Turbinates: Enlarged and swollen.     Right Sinus: No maxillary sinus tenderness or frontal sinus tenderness.     Left Sinus: No maxillary sinus tenderness or frontal sinus tenderness.     Mouth/Throat:     Mouth: Mucous membranes are not pale and not dry.     Pharynx: Uvula midline.  Eyes:     General:        Right eye: No discharge.        Left  eye: No discharge.     Conjunctiva/sclera: Conjunctivae normal.     Right eye: Right conjunctiva is not injected. No chemosis.    Left eye: Left conjunctiva is not injected. No chemosis.    Pupils: Pupils are equal, round, and reactive to light.  Cardiovascular:     Rate and Rhythm: Normal rate and regular rhythm.     Heart sounds: Normal heart sounds.  Pulmonary:     Effort: Pulmonary effort is normal. No tachypnea, accessory muscle usage or respiratory distress.     Breath sounds: Normal breath sounds. No wheezing, rhonchi or rales.     Comments: Decreased air movement at the bases.  Wheezing in the upper lung fields. Chest:     Chest wall: No tenderness.  Lymphadenopathy:     Cervical: No cervical adenopathy.  Skin:    Coloration: Skin is not pale.     Findings: No abrasion, erythema, petechiae or rash. Rash is not papular, urticarial or vesicular.  Neurological:     Mental Status: She is alert.  Psychiatric:        Behavior: Behavior is cooperative.      Diagnostic studies:    Spirometry: results abnormal (FEV1: 1.18/47%, FVC: 1.72/54%, FEV1/FVC: 69%).    Spirometry consistent with possible restrictive disease.    Allergy Studies: none       Salvatore Marvel, MD  [facility]

## 2020-02-14 ENCOUNTER — Encounter: Payer: Self-pay | Admitting: Allergy & Immunology

## 2020-02-14 ENCOUNTER — Telehealth: Payer: Self-pay | Admitting: Allergy & Immunology

## 2020-02-14 NOTE — Telephone Encounter (Signed)
Patient came in yesterday and seen dr gallaher and her trelegy was not called into [facility] [address]. [phone]

## 2020-02-15 ENCOUNTER — Other Ambulatory Visit: Payer: Self-pay

## 2020-02-15 ENCOUNTER — Other Ambulatory Visit: Payer: Self-pay | Admitting: *Deleted

## 2020-02-15 ENCOUNTER — Ambulatory Visit (HOSPITAL_BASED_OUTPATIENT_CLINIC_OR_DEPARTMENT_OTHER): Payer: Medicare Other | Attending: Pulmonary Disease | Admitting: Pulmonary Disease

## 2020-02-15 DIAGNOSIS — G4733 Obstructive sleep apnea (adult) (pediatric): Secondary | ICD-10-CM | POA: Insufficient documentation

## 2020-02-15 HISTORY — DX: Obstructive sleep apnea (adult) (pediatric): G47.33

## 2020-02-15 MED ORDER — TRELEGY ELLIPTA 200-62.5-25 MCG/INH IN AEPB: 1.0000 | INHALATION_SPRAY | Freq: Every day | RESPIRATORY_TRACT | 5 refills | Status: DC

## 2020-02-15 NOTE — Telephone Encounter (Signed)
Called and left a detailed voicemail per DPR permission advising.

## 2020-02-15 NOTE — Telephone Encounter (Signed)
Prescription has been sent again to the requested pharmacy. Will call patient once phones are working to advise.

## 2020-02-16 ENCOUNTER — Telehealth: Payer: Self-pay | Admitting: *Deleted

## 2020-02-16 ENCOUNTER — Telehealth: Payer: Self-pay | Admitting: Pulmonary Disease

## 2020-02-16 DIAGNOSIS — G4733 Obstructive sleep apnea (adult) (pediatric): Secondary | ICD-10-CM

## 2020-02-16 LAB — CBC WITH DIFFERENTIAL/PLATELET
Basophils Absolute: 0 10*3/uL (ref 0.0–0.2)
Basos: 0 %
EOS (ABSOLUTE): 0 10*3/uL (ref 0.0–0.4)
Eos: 0 %
Hematocrit: 34 % (ref 34.0–46.6)
Hemoglobin: 10.4 g/dL — ABNORMAL LOW (ref 11.1–15.9)
Immature Grans (Abs): 0 10*3/uL (ref 0.0–0.1)
Immature Granulocytes: 0 %
Lymphocytes Absolute: 2.3 10*3/uL (ref 0.7–3.1)
Lymphs: 36 %
MCH: 25.4 pg — ABNORMAL LOW (ref 26.6–33.0)
MCHC: 30.6 g/dL — ABNORMAL LOW (ref 31.5–35.7)
MCV: 83 fL (ref 79–97)
Monocytes Absolute: 0.5 10*3/uL (ref 0.1–0.9)
Monocytes: 7 %
Neutrophils Absolute: 3.7 10*3/uL (ref 1.4–7.0)
Neutrophils: 57 %
Platelets: 316 10*3/uL (ref 150–450)
RBC: 4.09 x10E6/uL (ref 3.77–5.28)
RDW: 14 % (ref 11.7–15.4)
WBC: 6.6 10*3/uL (ref 3.4–10.8)

## 2020-02-16 LAB — IGE: IgE (Immunoglobulin E), Serum: 43 IU/mL (ref 6–495)

## 2020-02-16 NOTE — Procedures (Signed)
    Patient Name: Patty Howard, Patty Howard Date: 02/15/2020 Gender: Female D.O.B: March 29, 1956 Age (years): 64 Referring Provider: Lauraine Rinne NP Height (inches): 70 Interpreting Physician: Chesley Mires MD, ABSM Weight (lbs): 300 RPSGT: Jorge Ny BMI: 43 MRN: [id number] Neck Size: 16.00  CLINICAL INFORMATION The patient is referred for a CPAP titration to treat sleep apnea.  Date of HST 05/31/2019:  AHI 32.5, SpO2 low 77%.  SLEEP STUDY TECHNIQUE As per the AASM Manual for the Scoring of Sleep and Associated Events v2.3 (April 2016) with a hypopnea requiring 4% desaturations.  The channels recorded and monitored were frontal, central and occipital EEG, electrooculogram (EOG), submentalis EMG (chin), nasal and oral airflow, thoracic and abdominal wall motion, anterior tibialis EMG, snore microphone, electrocardiogram, and pulse oximetry. Continuous positive airway pressure (CPAP) was initiated at the beginning of the study and titrated to treat sleep-disordered breathing.  MEDICATIONS Medications self-administered by patient taken the night of the study : N/A  TECHNICIAN COMMENTS Comments added by technician: NONE Comments added by scorer: N/A  RESPIRATORY PARAMETERS Optimal PAP Pressure (cm): 11 AHI at Optimal Pressure (/hr): 2.1 Overall Minimal O2 (%): 89.0 Supine % at Optimal Pressure (%): 51 Minimal O2 at Optimal Pressure (%): 89.0   SLEEP ARCHITECTURE The study was initiated at 11:11:26 PM and ended at 5:15:13 AM.  Sleep onset time was 25.7 minutes and the sleep efficiency was 70.1%%. The total sleep time was 255 minutes.  The patient spent 11.4%% of the night in stage N1 sleep, 80.0%% in stage N2 sleep, 0.0%% in stage N3 and 8.6% in REM.Stage REM latency was 194.5 minutes  Wake after sleep onset was 83.1. Alpha intrusion was absent. Supine sleep was 36.08%.  CARDIAC DATA The 2 lead EKG demonstrated sinus rhythm. The mean heart rate was 72.4 beats per  minute. Other EKG findings include: None.  LEG MOVEMENT DATA The total Periodic Limb Movements of Sleep (PLMS) were 0. The PLMS index was 0.0. A PLMS index of <15 is considered normal in adults.  IMPRESSIONS - The optimal PAP pressure was 11 cm of water. - She did not need supplemental oxygen during this study.  DIAGNOSIS - Obstructive Sleep Apnea (G47.33)  RECOMMENDATIONS - Trial of CPAP therapy on 11 cm H2O with a Medium size Resmed Full Face Mask AirFit F20 mask and heated humidification.  [Electronically signed] 02/16/2020 02:13 PM  Chesley Mires MD, [place], American Board of Sleep Medicine   NPI: [id number]

## 2020-02-16 NOTE — Telephone Encounter (Signed)
Called and L/M for  patient to discuss possible change from [place] to [place]

## 2020-02-16 NOTE — Telephone Encounter (Signed)
Called and spoke with pt letting her know the info stated by Aaron Edelman about the CPAP titration results. Pt verbalized understanding. Order has been placed. Nothing further needed.

## 2020-02-16 NOTE — Telephone Encounter (Signed)
-----   Message from Valentina Shaggy, MD sent at 02/14/2020  6:00 AM EDT ----- Hello Patty Howard!  At the last visit, we had discussed changing her from [place] to [place].  She was on the fence about it today.  Can you call her and talk to her about the differences?  Her breathing is actually a little bit better today and she has not needed prednisone since the last time I saw her, so maybe she is in a better space.

## 2020-02-16 NOTE — Telephone Encounter (Signed)
02/16/2020  Please let the patient know that the results of her CPAP titration are as follows:  RECOMMENDATIONS - Trial of CPAP therapy on 11 cm H2O with a Medium size Resmed Full Face Mask AirFit F20 mask and heated humidification.  It is recommended that her CPAP be at a set pressure of 11.  This can be discussed in more details next week when the patient is seen in our office.  Wyn Quaker, FNP

## 2020-02-18 ENCOUNTER — Other Ambulatory Visit: Payer: Self-pay | Admitting: Allergy & Immunology

## 2020-02-19 NOTE — Progress Notes (Deleted)
@Patient  ID: Patty Howard, female    DOB: 07/11/1955, 64 y.o.   MRN: [id number]  No chief complaint on file.   Referring provider: No ref. provider found  HPI:   64 year old female former smoker followed in our office for obstructive lung disease as well as severe obstructive sleep apnea.  PMH: Prediabetes, hypertension, aortic arthrosclerosis Smoker/ Smoking History: Former smoker.  Quit 2000.  5-pack-year smoking history Maintenance: Breo Ellipta 200, Incruse Pt of: Dr. Jenetta Downer  02/19/2020  - Visit     Questionaires / Pulmonary Flowsheets:   ACT:  Asthma Control Test ACT Total Score  02/13/2020 5  01/18/2020 15  10/31/2019 5    MMRC: No flowsheet data found.  Epworth:  Results of the Epworth flowsheet 04/05/2019  Sitting and reading 3  Watching TV 3  Sitting, inactive in a public place (e.g. a theatre or a meeting) 3  As a passenger in a car for an hour without a break 3  Lying down to rest in the afternoon when circumstances permit 3  Sitting and talking to someone 3  Sitting quietly after a lunch without alcohol 3  In a car, while stopped for a few minutes in traffic 3  Total score 24    Tests:   05/30/2020-home sleep study-AHI 32.5, SaO2 low 77%  10/31/2019-spirometry-FVC 1.76 (54% predicted), ratio 72, FEV1 1.27 (50% predicted)   FENO:  No results found for: NITRICOXIDE  PFT: No flowsheet data found.  WALK:  No flowsheet data found.  Imaging: No results found.  Lab Results:  CBC    Component Value Date/Time   WBC 6.6 02/13/2020 1554   WBC 13.0 (H) 11/28/2019 0429   RBC 4.09 02/13/2020 1554   RBC 4.10 11/28/2019 0429   HGB 10.4 (L) 02/13/2020 1554   HCT 34.0 02/13/2020 1554   PLT 316 02/13/2020 1554   MCV 83 02/13/2020 1554   MCH 25.4 (L) 02/13/2020 1554   MCH 26.1 11/28/2019 0429   MCHC 30.6 (L) 02/13/2020 1554   MCHC 30.6 11/28/2019 0429   RDW 14.0 02/13/2020 1554   LYMPHSABS 2.3 02/13/2020 1554   MONOABS 0.4 06/03/2019 2220    EOSABS 0.0 02/13/2020 1554   BASOSABS 0.0 02/13/2020 1554    BMET    Component Value Date/Time   NA 138 11/28/2019 0429   K 3.9 11/28/2019 0429   CL 99 11/28/2019 0429   CO2 28 11/28/2019 0429   GLUCOSE 135 (H) 11/28/2019 0429   BUN 22 11/28/2019 0429   CREATININE 0.89 11/28/2019 0429   CALCIUM 8.9 11/28/2019 0429   GFRNONAA >60 11/28/2019 0429   GFRAA >60 11/28/2019 0429    BNP    Component Value Date/Time   BNP 51.6 11/27/2019 0413    ProBNP No results found for: PROBNP  Specialty Problems      Pulmonary Problems   Asthma attack   Dyspnea   COPD exacerbation ([place])   Asthma   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) ([place])   Acute asthma exacerbation   Sleep apnea    does not wear CPAP      OSA (obstructive sleep apnea)      Allergies  Allergen Reactions  . Banana Hives and Itching    Immunization History  Administered Date(s) Administered  . Influenza, High Dose Seasonal PF 03/06/2019  . Moderna SARS-COVID-2 Vaccination 08/19/2019, 09/14/2019    Past Medical History:  Diagnosis Date  . Anxiety   . Arthritis   . Asthma   . COPD (  chronic obstructive pulmonary disease) ([place])   . Fracture    closed displaced left radial head  . Hypertension   . Sleep apnea    does not wear CPAP    Tobacco History: Social History   Tobacco Use  Smoking Status Former Smoker  . Packs/day: 0.50  . Years: 10.00  . Pack years: 5.00  . Types: Cigarettes  . Quit date: 06/15/1998  . Years since quitting: 21.6  Smokeless Tobacco Never Used   Counseling given: Not Answered   Continue to not smoke  Outpatient Encounter Medications as of 02/20/2020  Medication Sig  . albuterol (VENTOLIN HFA) 108 (90 Base) MCG/ACT inhaler INHALE 1 TO 2 PUFFS INTO THE LUNGS EVERY 6 HOURS AS NEEDED FOR WHEEZING OR SHORTNESS OF BREATH  . amLODipine (NORVASC) 5 MG tablet Take 1 tablet (5 mg total) by mouth daily.  Marland Kitchen amoxicillin-clavulanate (AUGMENTIN) 875-125 MG tablet  Take 1 tablet by mouth 2 (two) times daily. One po bid x 7 days  . aspirin EC 81 MG tablet Take 81 mg by mouth daily. Swallow whole.  Marland Kitchen atorvastatin (LIPITOR) 10 MG tablet Take 10 mg by mouth daily.  . cyclobenzaprine (FLEXERIL) 10 MG tablet Take 10 mg by mouth 2 (two) times daily as needed for muscle spasms.   . Fluticasone-Umeclidin-Vilant (TRELEGY ELLIPTA) 200-62.5-25 MCG/INH AEPB Inhale 1 puff into the lungs daily.  . furosemide (LASIX) 40 MG tablet Take 1 tablet (40 mg total) by mouth daily as needed for fluid or edema (WEIGHT GAIN OF 2LBS, SHORTNESS OF BREATH).  Marland Kitchen HYDROcodone-acetaminophen (NORCO/VICODIN) 5-325 MG tablet Take 1 tablet by mouth every 6 (six) hours as needed.  Marland Kitchen ipratropium-albuterol (DUONEB) 0.5-2.5 (3) MG/3ML SOLN Inhale 3 mLs into the lungs every 6 (six) hours as needed (wheezing).  Marland Kitchen levocetirizine (XYZAL) 5 MG tablet Take 1 tablet (5 mg total) by mouth every evening.  Marland Kitchen losartan-hydrochlorothiazide (HYZAAR) 100-25 MG tablet Take 1 tablet by mouth daily.  . montelukast (SINGULAIR) 10 MG tablet Take 1 tablet (10 mg total) by mouth at bedtime.  . Multiple Vitamin (MULTIVITAMIN WITH MINERALS) TABS tablet Take 1 tablet by mouth daily. Centrum Multivitamin  . Vitamin D, Ergocalciferol, (DRISDOL) 1.25 MG (50000 UNIT) CAPS capsule Take 50,000 Units by mouth every Monday.   . zolpidem (AMBIEN) 10 MG tablet Take 10 mg by mouth at bedtime as needed for sleep.  . [DISCONTINUED] pantoprazole (PROTONIX) 40 MG tablet Take 1 tablet (40 mg total) by mouth daily. (Patient not taking: Reported on 05/21/2019)   Facility-Administered Encounter Medications as of 02/20/2020  Medication  . Benralizumab SOSY 30 mg     Review of Systems  Review of Systems   Physical Exam  There were no vitals taken for this visit.  Wt Readings from Last 5 Encounters:  02/15/20 300 lb (136.1 kg)  02/13/20 (!) 306 lb 6.4 oz (139 kg)  01/14/20 (!) 300 lb (136.1 kg)  01/08/20 (!) 304 lb 3.2 oz (138 kg)    12/19/19 297 lb (134.7 kg)    BMI Readings from Last 5 Encounters:  02/15/20 43.05 kg/m  02/13/20 43.96 kg/m  01/14/20 43.05 kg/m  01/08/20 43.65 kg/m  12/19/19 42.62 kg/m     Physical Exam    Assessment & Plan:   No problem-specific Assessment & Plan notes found for this encounter.    No follow-ups on file.   Lauraine Rinne, NP 02/19/2020   This appointment required *** minutes of patient care (this includes precharting, chart review, review of results,  face-to-face care, etc.).

## 2020-02-20 ENCOUNTER — Ambulatory Visit: Payer: Medicare Other | Admitting: Pulmonary Disease

## 2020-02-20 ENCOUNTER — Ambulatory Visit (INDEPENDENT_AMBULATORY_CARE_PROVIDER_SITE_OTHER): Payer: Medicare Other

## 2020-02-20 ENCOUNTER — Other Ambulatory Visit: Payer: Self-pay

## 2020-02-20 DIAGNOSIS — J4550 Severe persistent asthma, uncomplicated: Secondary | ICD-10-CM | POA: Diagnosis not present

## 2020-02-20 NOTE — Telephone Encounter (Signed)
L/m for patient to call me

## 2020-02-22 ENCOUNTER — Ambulatory Visit: Payer: Self-pay | Admitting: Allergy & Immunology

## 2020-02-27 ENCOUNTER — Ambulatory Visit: Payer: Self-pay

## 2020-02-27 NOTE — Telephone Encounter (Signed)
Lm for patient to return my call.

## 2020-02-29 NOTE — Telephone Encounter (Signed)
I have left several messages for patient to call me back with no response.  If patient become interested in possible change in biologic therapy she can reach back out to me

## 2020-03-14 ENCOUNTER — Other Ambulatory Visit: Payer: Self-pay

## 2020-03-14 ENCOUNTER — Other Ambulatory Visit: Payer: Self-pay | Admitting: Pulmonary Disease

## 2020-03-14 ENCOUNTER — Ambulatory Visit (INDEPENDENT_AMBULATORY_CARE_PROVIDER_SITE_OTHER): Payer: Medicare Other | Admitting: Pulmonary Disease

## 2020-03-14 ENCOUNTER — Encounter: Payer: Self-pay | Admitting: Pulmonary Disease

## 2020-03-14 VITALS — BP 132/82 | HR 104 | Temp 97.0°F | Ht 70.0 in | Wt 304.0 lb

## 2020-03-14 DIAGNOSIS — J4550 Severe persistent asthma, uncomplicated: Secondary | ICD-10-CM | POA: Diagnosis not present

## 2020-03-14 DIAGNOSIS — R918 Other nonspecific abnormal finding of lung field: Secondary | ICD-10-CM | POA: Diagnosis not present

## 2020-03-14 DIAGNOSIS — K219 Gastro-esophageal reflux disease without esophagitis: Secondary | ICD-10-CM | POA: Diagnosis not present

## 2020-03-14 DIAGNOSIS — G4733 Obstructive sleep apnea (adult) (pediatric): Secondary | ICD-10-CM

## 2020-03-14 MED ORDER — PANTOPRAZOLE SODIUM 40 MG PO TBEC: 40.0000 mg | DELAYED_RELEASE_TABLET | Freq: Every day | ORAL | 3 refills | Status: DC

## 2020-03-14 NOTE — Assessment & Plan Note (Signed)
5 mm pulmonary nodule seen on CTA chest in June/2021  Plan: We will repeat CT of chest in 1 year

## 2020-03-14 NOTE — Progress Notes (Signed)
@Patient  ID: Patty Howard, female    DOB: [birth year], 64 y.o.   MRN: [id number]  Chief Complaint  Patient presents with  . Follow-up    Asthma, OSA    Referring provider: No ref. provider found  HPI:  64 year old female former smoker followed in our office for obstructive lung disease as well as severe obstructive sleep apnea.  PMH: Prediabetes, hypertension, aortic arthrosclerosis Smoker/ Smoking History: Former smoker.  Quit 2000.  5-pack-year smoking history Maintenance: Trelegy 200 Pt of: Dr. Jenetta Downer  03/14/2020  - Visit   64 year old female former smoker followed in our office for severe persistent asthma and obstructive sleep apnea.  She is presenting today as a follow-up visit.  She was last seen in our office in July/2021.  Plan of care at that office visit was for her to be maintained on [place].  She was encouraged to have immediate follow-up with allergy asthma.  She was encouraged to schedule pulmonary function testing.  She was encouraged to start a flutter valve.  She had poorly controlled sleep apnea.  We recommended that we obtain a CPAP titration to further evaluate.  She has a 5 mm pulmonary nodule seen on the CTA of her chest in June/2021 we will repeat a CT of her chest in June/2022.  After CPAP titration it was recommended that she have a set pressure of 11. CPAP compliance were listed below does not show a pressure setting of 11.  We will discuss this today.  CPAP compliance report listed below:  02/13/2020-03/13/2020-CPAP compliance report-26 at the last 30 days use, 14 of those days greater than 4 hours, average usage 4 hours and 10 minutes, APAP setting 5-18, 95th percentile 12.3, AHI 5.7  Patient has scheduled follow-up with Dr. Ernst Bowler with allergy asthma on 02/13/2020.  That assessment and plan as listed below:  1. Severe persistent asthma, uncomplicated - Lung testing looked slightly better today. - We will give you a  prednisone course to have on hand when you are traveling if needed.  - Stop the Breo and the Incruse and start TRELEGY one puff once daily (contains those three classes of medications).  - Daily controller medication(s): Trelegy 253mg one puff once daily + Singulair 156mdaily + DuoNeb nebulizer twice daily + Fasenra every 8 months - Prior to physical activity: albuterol 2 puffs 10-15 minutes before physical activity. - Rescue medications: albuterol 4 puffs every 4-6 hours as needed, albuterol nebulizer one vial every 4-6 hours as needed or DuoNeb nebulizer one vial every 4-6 hours as needed - Asthma control goals:  * Full participation in all desired activities (may need albuterol before activity) * Albuterol use two time or less a week on average (not counting use with activity) * Cough interfering with sleep two time or less a month * Oral steroids no more than once a year * No hospitalizations  2. Chronic rhinitis (dust mites, molds) - Continue with Flonase one spray per nostril twice daily to  - Continue with Xyzal (levocetirizine) 32m73maily.control any postnasal drip.   3. Gastroesophageal reflux disease - Continue with Protonix 82m43mce daily to control any silent reflux.  4. Return in about 2 months (around 04/14/2020). This can be an in-person, a virtual Webex or a telephone follow up visit.  Patient reports that she recently traveled to [place] when she was there she had a flare of her asthma symptoms.  She was seen by an urgent care provider and  was treated with a Z-Pak and prednisone.  ACT score today is 11.  She reports she has been maintained on [place] since April/2021.  She reports no breaks in therapy with Fasenra, Trelegy Ellipta 200, Xyzal, Singulair.  Unfortunately she did stop taking her pantoprazole/Protonix 1 month ago she is unsure why.  She reports she feels that she ran out of these medications.  She does admit to having breakthrough reflux, also often  throughout the day after eating she has lots of belching episodes.  These are new symptoms.   Questionaires / Pulmonary Flowsheets:   ACT:  Asthma Control Test ACT Total Score  03/14/2020 11  02/13/2020 5  01/18/2020 15    MMRC: No flowsheet data found.  Epworth:  Results of the Epworth flowsheet 04/05/2019  Sitting and reading 3  Watching TV 3  Sitting, inactive in a public place (e.g. a theatre or a meeting) 3  As a passenger in a car for an hour without a break 3  Lying down to rest in the afternoon when circumstances permit 3  Sitting and talking to someone 3  Sitting quietly after a lunch without alcohol 3  In a car, while stopped for a few minutes in traffic 3  Total score 24    Tests:   05/30/2020-home sleep study-AHI 32.5, SaO2 low 77%  10/31/2019-spirometry-FVC 1.76 (54% predicted), ratio 72, FEV1 1.27 (50% predicted)  02/13/2020-IgE-43 02/13/2020-CBC with differential-eosinophils 0  FENO:  No results found for: NITRICOXIDE  PFT: No flowsheet data found.  WALK:  No flowsheet data found.  Imaging: SLEEP STUDY DOCUMENTS  Result Date: 02/22/2020 Ordered by an unspecified provider.   Lab Results:  CBC    Component Value Date/Time   WBC 6.6 02/13/2020 1554   WBC 13.0 (H) 11/28/2019 0429   RBC 4.09 02/13/2020 1554   RBC 4.10 11/28/2019 0429   HGB 10.4 (L) 02/13/2020 1554   HCT 34.0 02/13/2020 1554   PLT 316 02/13/2020 1554   MCV 83 02/13/2020 1554   MCH 25.4 (L) 02/13/2020 1554   MCH 26.1 11/28/2019 0429   MCHC 30.6 (L) 02/13/2020 1554   MCHC 30.6 11/28/2019 0429   RDW 14.0 02/13/2020 1554   LYMPHSABS 2.3 02/13/2020 1554   MONOABS 0.4 06/03/2019 2220   EOSABS 0.0 02/13/2020 1554   BASOSABS 0.0 02/13/2020 1554    BMET    Component Value Date/Time   NA 138 11/28/2019 0429   K 3.9 11/28/2019 0429   CL 99 11/28/2019 0429   CO2 28 11/28/2019 0429   GLUCOSE 135 (H) 11/28/2019 0429   BUN 22 11/28/2019 0429   CREATININE 0.89 11/28/2019 0429    CALCIUM 8.9 11/28/2019 0429   GFRNONAA >60 11/28/2019 0429   GFRAA >60 11/28/2019 0429    BNP    Component Value Date/Time   BNP 51.6 11/27/2019 0413    ProBNP No results found for: PROBNP  Specialty Problems      Pulmonary Problems   Asthma attack   Dyspnea   COPD exacerbation ([place])   Asthma   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) ([place])   Acute asthma exacerbation   Sleep apnea    does not wear CPAP      OSA (obstructive sleep apnea)      Allergies  Allergen Reactions  . Banana Hives and Itching    Immunization History  Administered Date(s) Administered  . Influenza, High Dose Seasonal PF 03/06/2019  . Influenza,inj,Quad PF,6+ Mos 04/07/2018  . Influenza-Unspecified 03/29/2017  .  Moderna SARS-COVID-2 Vaccination 08/19/2019, 09/14/2019  . Pneumococcal Polysaccharide-23 03/29/2017  . Tdap 10/30/2017  . Zoster Recombinat (Shingrix) 03/30/2019    Past Medical History:  Diagnosis Date  . Anxiety   . Arthritis   . Asthma   . COPD (chronic obstructive pulmonary disease) ([place])   . Fracture    closed displaced left radial head  . Hypertension   . Sleep apnea    does not wear CPAP    Tobacco History: Social History   Tobacco Use  Smoking Status Former Smoker  . Packs/day: 0.50  . Years: 10.00  . Pack years: 5.00  . Types: Cigarettes  . Quit date: 06/15/1998  . Years since quitting: 21.7  Smokeless Tobacco Never Used   Counseling given: Not Answered   Continue to not smoke  Outpatient Encounter Medications as of 03/14/2020  Medication Sig  . albuterol (VENTOLIN HFA) 108 (90 Base) MCG/ACT inhaler INHALE 1-2 PUFFS INTO THE LUNGS EVERY 6 HOURS AS NEEDED FOR WHEEZING OR SHORTNESS OF BREATH.  Marland Kitchen aspirin EC 81 MG tablet Take 81 mg by mouth daily. Swallow whole.  Marland Kitchen atorvastatin (LIPITOR) 10 MG tablet Take 10 mg by mouth daily.  . bumetanide (BUMEX) 2 MG tablet Take 2 mg by mouth daily.  . cyclobenzaprine (FLEXERIL) 10 MG tablet Take 10  mg by mouth 2 (two) times daily as needed for muscle spasms.   . Fluticasone-Umeclidin-Vilant (TRELEGY ELLIPTA) 200-62.5-25 MCG/INH AEPB Inhale 1 puff into the lungs daily.  Marland Kitchen HYDROcodone-acetaminophen (NORCO/VICODIN) 5-325 MG tablet Take 1 tablet by mouth every 6 (six) hours as needed.  Marland Kitchen ipratropium-albuterol (DUONEB) 0.5-2.5 (3) MG/3ML SOLN Inhale 3 mLs into the lungs every 6 (six) hours as needed (wheezing).  Marland Kitchen levocetirizine (XYZAL) 5 MG tablet Take 1 tablet (5 mg total) by mouth every evening.  Marland Kitchen losartan-hydrochlorothiazide (HYZAAR) 100-25 MG tablet Take 1 tablet by mouth daily.  . montelukast (SINGULAIR) 10 MG tablet Take 1 tablet (10 mg total) by mouth at bedtime.  . Multiple Vitamin (MULTIVITAMIN WITH MINERALS) TABS tablet Take 1 tablet by mouth daily. Centrum Multivitamin  . Vitamin D, Ergocalciferol, (DRISDOL) 1.25 MG (50000 UNIT) CAPS capsule Take 50,000 Units by mouth every Monday.   . zolpidem (AMBIEN) 10 MG tablet Take 10 mg by mouth at bedtime as needed for sleep.  . [DISCONTINUED] amoxicillin-clavulanate (AUGMENTIN) 875-125 MG tablet Take 1 tablet by mouth 2 (two) times daily. One po bid x 7 days  . amLODipine (NORVASC) 5 MG tablet Take 1 tablet (5 mg total) by mouth daily.  . furosemide (LASIX) 40 MG tablet Take 1 tablet (40 mg total) by mouth daily as needed for fluid or edema (WEIGHT GAIN OF 2LBS, SHORTNESS OF BREATH).  Marland Kitchen pantoprazole (PROTONIX) 40 MG tablet Take 1 tablet (40 mg total) by mouth daily.  . [DISCONTINUED] pantoprazole (PROTONIX) 40 MG tablet Take 1 tablet (40 mg total) by mouth daily. (Patient not taking: Reported on 05/21/2019)   Facility-Administered Encounter Medications as of 03/14/2020  Medication  . Benralizumab SOSY 30 mg     Review of Systems  Review of Systems  Constitutional: Positive for fatigue. Negative for activity change and fever.  HENT: Positive for congestion. Negative for sinus pressure, sinus pain and sore throat.   Respiratory: Positive  for cough, shortness of breath and wheezing.   Cardiovascular: Negative for chest pain and palpitations.  Gastrointestinal: Negative for diarrhea, nausea and vomiting.       Daily burps, feels potentially  Ran out ppi last month  Musculoskeletal: Negative for arthralgias.  Neurological: Negative for dizziness.  Psychiatric/Behavioral: Negative for sleep disturbance. The patient is not nervous/anxious.      Physical Exam  BP 132/82 (BP Location: Left Arm, Cuff Size: Normal)   Pulse (!) 104   Temp (!) 97 F (36.1 C) (Oral)   Ht 5' 10"  (1.778 m)   Wt (!) 304 lb (137.9 kg)   SpO2 99%   BMI 43.62 kg/m   Wt Readings from Last 5 Encounters:  03/14/20 (!) 304 lb (137.9 kg)  02/15/20 300 lb (136.1 kg)  02/13/20 (!) 306 lb 6.4 oz (139 kg)  01/14/20 (!) 300 lb (136.1 kg)  01/08/20 (!) 304 lb 3.2 oz (138 kg)    BMI Readings from Last 5 Encounters:  03/14/20 43.62 kg/m  02/15/20 43.05 kg/m  02/13/20 43.96 kg/m  01/14/20 43.05 kg/m  01/08/20 43.65 kg/m     Physical Exam Vitals and nursing note reviewed.  Constitutional:      General: She is not in acute distress.    Appearance: Normal appearance. She is obese.  HENT:     Head: Normocephalic and atraumatic.     Right Ear: Tympanic membrane, ear canal and external ear normal. There is no impacted cerumen.     Left Ear: Tympanic membrane, ear canal and external ear normal. There is no impacted cerumen.     Nose: Rhinorrhea present. No congestion.     Mouth/Throat:     Mouth: Mucous membranes are moist.     Pharynx: Oropharynx is clear.     Comments: Postnasal drip Eyes:     Pupils: Pupils are equal, round, and reactive to light.  Cardiovascular:     Rate and Rhythm: Normal rate and regular rhythm.     Pulses: Normal pulses.     Heart sounds: Normal heart sounds. No murmur heard.   Pulmonary:     Effort: Pulmonary effort is normal. No respiratory distress.     Breath sounds: No decreased air movement. No decreased  breath sounds, wheezing or rales.  Musculoskeletal:     Cervical back: Normal range of motion.  Skin:    General: Skin is warm and dry.     Capillary Refill: Capillary refill takes less than 2 seconds.  Neurological:     General: No focal deficit present.     Mental Status: She is alert and oriented to person, place, and time. Mental status is at baseline.     Gait: Gait normal.  Psychiatric:        Mood and Affect: Mood normal.        Behavior: Behavior normal.        Thought Content: Thought content normal.        Judgment: Judgment normal.       Assessment & Plan:   OSA (obstructive sleep apnea) Plan: Continue CPAP therapy Change CPAP pressure to set pressure of 11   Asthma Recently treated for asthma exacerbation Established with Dr. Ernst Bowler Maintained on Breo Ellipta 200, Incruse Ellipta Receiving immunotherapy and Fasenra  Plan: Continue follow up with allergy asthma Continue Trelegy Ellipta 200 Schedule pulmonary function testing today before leaving our office 8 week follow-up with our office Start flutter valve  Gastroesophageal reflux disease Currently off of PPI Uncontrolled acid reflux  Plan: Restart Protonix    Return in about 2 months (around 05/14/2020), or if symptoms worsen or fail to improve, for Follow up with Dr. Ander Slade.   Lauraine Rinne, NP 03/14/2020   This  appointment required 34 minutes of patient care (this includes precharting, chart review, review of results, face-to-face care, etc.).

## 2020-03-14 NOTE — Assessment & Plan Note (Signed)
Currently off of PPI Uncontrolled acid reflux  Plan: Restart Protonix

## 2020-03-14 NOTE — Assessment & Plan Note (Signed)
Plan: Continue CPAP therapy Change CPAP pressure to set pressure of 11

## 2020-03-14 NOTE — Assessment & Plan Note (Addendum)
Recently treated for asthma exacerbation Established with Dr. Ernst Bowler Maintained on [facility] 200, Incruse Ellipta Receiving immunotherapy and Fasenra  Plan: Continue follow up with allergy asthma Continue Trelegy Ellipta 200 Schedule pulmonary function testing today before leaving our office 8 week follow-up with our office Start flutter valve

## 2020-03-14 NOTE — Patient Instructions (Addendum)
You were seen today by Lauraine Rinne, NP  for:   1. Severe persistent asthma without complication  - Flutter valve; Future - pantoprazole (PROTONIX) 40 MG tablet; Take 1 tablet (40 mg total) by mouth daily.  Dispense: 30 tablet; Refill: 3  Trelegy Ellipta 200 >>> 1 puff daily in the morning >>>rinse mouth out after use  >>> This inhaler contains 3 medications that help manage her respiratory status, contact our office if you cannot afford this medication or unable to remain on this medication  Continue Xyzal  Continue Singulair  Resume taking your Protonix for acid reflux management  Continue Berna Bue  Keep upcoming allergy asthma appointment on 04/16/2020  Notify our office in 2 weeks and let us know how you are doing   Use a flutter valve 10 breaths twice a day or 4 to 5 breaths 4-5 times a day to help clear mucus out   When you receive the records from [place] when you had your chest x-ray please provide this with our office   2. OSA (obstructive sleep apnea)  We will change her CPAP set pressure to 11 based off your CPAP titration  We recommend that you continue using your CPAP daily >>>Keep up the hard work using your device >>> Goal should be wearing this for the entire night that you are sleeping, at least 4 to 6 hours  Remember:  . Do not drive or operate heavy machinery if tired or drowsy.  . Please notify the supply company and office if you are unable to use your device regularly due to missing supplies or machine being broken.  . Work on maintaining a healthy weight and following your recommended nutrition plan  . Maintain proper daily exercise and movement  . Maintaining proper use of your device can also help improve management of other chronic illnesses such as: Blood pressure, blood sugars, and weight management.   BiPAP/ CPAP Cleaning:  >>>Clean weekly, with Dawn soap, and bottle brush.  Set up to air dry. >>> Wipe mask out daily with wet wipe or  towelette    3. Gastroesophageal reflux disease, unspecified whether esophagitis present  - pantoprazole (PROTONIX) 40 MG tablet; Take 1 tablet (40 mg total) by mouth daily.  Dispense: 30 tablet; Refill: 3  Protnix 40 mg tablet  >>>Please take 1 tablet daily 15 minutes to 30 minutes before your first meal of the day as well as before your other medications >>>Try to take at the same time each day >>>take this medication daily   GERD management: >>>Avoid laying flat until 2 hours after meals >>>Elevate head of the bed including entire chest >>>Reduce size of meals and amount of fat, acid, spices, caffeine and sweets >>>If you are smoking, Please stop! >>>Decrease alcohol consumption >>>Work on maintaining a healthy weight with normal BMI     We recommend today:  Orders Placed This Encounter  Procedures  . Ambulatory Referral for DME    Referral Priority:   Routine    Referral Type:   Durable Medical Equipment Purchase    Number of Visits Requested:   1  . Flutter valve    Standing Status:   Future    Standing Expiration Date:   03/14/2021   Orders Placed This Encounter  Procedures  . Ambulatory Referral for DME  . Flutter valve   Meds ordered this encounter  Medications  . pantoprazole (PROTONIX) 40 MG tablet    Sig: Take 1 tablet (40 mg total) by mouth  daily.    Dispense:  30 tablet    Refill:  3    Follow Up:    Return in about 2 months (around 05/14/2020), or if symptoms worsen or fail to improve, for Follow up with Dr. Ander Slade.   Notification of test results are managed in the following manner: If there are  any recommendations or changes to the  plan of care discussed in office today,  we will contact you and let you know what they are. If you do not hear from [place], then your results are normal and you can view them through your  MyChart account , or a letter will be sent to you. Thank you again for trusting [place] with your care  - Thank you, [facility]    It is flu season:   >>> Best ways to protect herself from the flu: Receive the yearly flu vaccine, practice good hand hygiene washing with soap and also using hand sanitizer when available, eat a nutritious meals, get adequate rest, hydrate appropriately       Please contact the office if your symptoms worsen or you have concerns that you are not improving.   Thank you for choosing [facility] for your healthcare, and for allowing [place] to partner with you on your healthcare journey. I am thankful to be able to provide care to you today.   Wyn Quaker FNP-C

## 2020-03-15 ENCOUNTER — Other Ambulatory Visit: Payer: Self-pay | Admitting: Allergy & Immunology

## 2020-03-20 ENCOUNTER — Ambulatory Visit: Payer: Medicare Other | Admitting: Cardiology

## 2020-03-21 ENCOUNTER — Ambulatory Visit: Payer: Medicare Other | Admitting: Cardiology

## 2020-04-15 NOTE — Progress Notes (Deleted)
Cardiology Office Note   Date:  04/15/2020   ID:  Patty Howard, DOB [birth year], MRN [id number]  PCP:  Buzzy Han, MD  Cardiologist:   No primary care provider on file. Referring:  Buzzy Han, MD  No chief complaint on file.     History of Present Illness: Patty Howard is a 64 y.o. female who presents for evaluation of shortness of breath and chest pain. She was just hospitalized on 6/13-6/15. She has a history of asthma and COPD.  Echo in June demonstrated no significant valvular abnormalities and a well preserved EF.  Lexiscan Myoview demonstrated no ischemia.  ***   *** .  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 She presented to the emergency room recently when she developed some increased shortness of breath. There was some chest burning associated with this. She was treated for COPD flare and was hospitalized. I reviewed these records. Chest CT didn't demonstrate fluid and her BNP was normal but she was treated for possible diastolic acute heart failure as well as a COPD flare. She was sent home with a short course of diuretic and with steroids. She feels better. However, she does report that she has been getting some chest discomfort over the last few months. Been slowly getting worse with activities. She thinks her breathing has been a little worse. She is not describing new PND or orthopnea. She is not describing new palpitations, presyncope or syncope. She had some episodes of sweating. This seems to happen when she is short of breath.  CT did demonstrate aortic atherosclerosis.      Past Medical History:  Diagnosis Date   Anxiety    Arthritis    Asthma    COPD (chronic obstructive pulmonary disease) ([place])    Fracture    closed displaced left radial head   Hypertension    Sleep apnea    does not wear CPAP    Past Surgical  History:  Procedure Laterality Date   BACK SURGERY     spinal stimulator   COLONOSCOPY W/ BIOPSIES AND POLYPECTOMY     DILATION AND CURETTAGE OF UTERUS     LAPAROSCOPIC ROUX-EN-Y GASTRIC BYPASS WITH UPPER ENDOSCOPY AND REMOVAL OF LAP BAND     RADIAL HEAD ARTHROPLASTY Left 02/03/2019   Procedure: LEFT RADIAL HEAD ARTHROPLASTY;  Surgeon: [name], MD;  Location: [facility];  Service: Orthopedics;  Laterality: Left;  AXILLARY BLOCK VS BIER BLOCK   TUBAL LIGATION       Current Outpatient Medications  Medication Sig Dispense Refill   albuterol (VENTOLIN HFA) 108 (90 Base) MCG/ACT inhaler INHALE 1 TO 2 PUFFS INTO THE LUNGS EVERY 6 HOURS AS NEEDED FOR WHEEZING OR SHORTNESS OF BREATH 6.7 g 1   amLODipine (NORVASC) 5 MG tablet Take 1 tablet (5 mg total) by mouth daily. 90 tablet 3   aspirin EC 81 MG tablet Take 81 mg by mouth daily. Swallow whole.     atorvastatin (LIPITOR) 10 MG tablet Take 10 mg by mouth daily.     bumetanide (BUMEX) 2 MG tablet Take 2 mg by mouth daily.     cyclobenzaprine (FLEXERIL) 10 MG tablet Take 10 mg by mouth 2 (two) times daily as needed for muscle spasms.      Fluticasone-Umeclidin-Vilant (TRELEGY ELLIPTA) 200-62.5-25 MCG/INH AEPB Inhale 1 puff into the lungs daily. 30 each 5  furosemide (LASIX) 40 MG tablet Take 1 tablet (40 mg total) by mouth daily as needed for fluid or edema (WEIGHT GAIN OF 2LBS, SHORTNESS OF BREATH). 30 tablet 3   HYDROcodone-acetaminophen (NORCO/VICODIN) 5-325 MG tablet Take 1 tablet by mouth every 6 (six) hours as needed. 10 tablet 0   ipratropium-albuterol (DUONEB) 0.5-2.5 (3) MG/3ML SOLN Inhale 3 mLs into the lungs every 6 (six) hours as needed (wheezing). 3 mL 1   levocetirizine (XYZAL) 5 MG tablet Take 1 tablet (5 mg total) by mouth every evening. 90 tablet 1   losartan-hydrochlorothiazide (HYZAAR) 100-25 MG tablet Take 1 tablet by mouth daily.     montelukast (SINGULAIR) 10 MG tablet Take 1 tablet (10 mg total) by mouth at  bedtime. 30 tablet 5   Multiple Vitamin (MULTIVITAMIN WITH MINERALS) TABS tablet Take 1 tablet by mouth daily. Centrum Multivitamin     pantoprazole (PROTONIX) 40 MG tablet Take 1 tablet (40 mg total) by mouth daily. 30 tablet 3   Vitamin D, Ergocalciferol, (DRISDOL) 1.25 MG (50000 UNIT) CAPS capsule Take 50,000 Units by mouth every Monday.      zolpidem (AMBIEN) 10 MG tablet Take 10 mg by mouth at bedtime as needed for sleep.     Current Facility-Administered Medications  Medication Dose Route Frequency Provider Last Rate Last Admin   Benralizumab SOSY 30 mg  30 mg Subcutaneous Q8 Toney Reil, MD   30 mg at 02/20/20 1344    Allergies:   Banana    ROS:  Please see the history of present illness.   Otherwise, review of systems are positive for ***.   All other systems are reviewed and negative.    PHYSICAL EXAM: VS:  There were no vitals taken for this visit. , BMI There is no height or weight on file to calculate BMI. GENERAL:  Well appearing NECK:  No jugular venous distention, waveform within normal limits, carotid upstroke brisk and symmetric, no bruits, no thyromegaly LUNGS:  Clear to auscultation bilaterally CHEST:  Unremarkable HEART:  PMI not displaced or sustained,S1 and S2 within normal limits, no S3, no S4, no clicks, no rubs, *** murmurs ABD:  Flat, positive bowel sounds normal in frequency in pitch, no bruits, no rebound, no guarding, no midline pulsatile mass, no hepatomegaly, no splenomegaly EXT:  2 plus pulses throughout, no edema, no cyanosis no clubbing    ***GENERAL:  Well appearing HEENT:  Pupils equal round and reactive, fundi not visualized, oral mucosa unremarkable NECK:  No jugular venous distention, waveform within normal limits, carotid upstroke brisk and symmetric, no bruits, no thyromegaly LYMPHATICS:  No cervical, inguinal adenopathy LUNGS: Few scattered wheezes BACK:  No CVA tenderness CHEST:  Unremarkable HEART:  PMI not displaced  or sustained,S1 and S2 within normal limits, no S3, no S4, no clicks, no rubs, no murmurs ABD:  Flat, positive bowel sounds normal in frequency in pitch, no bruits, no rebound, no guarding, no midline pulsatile mass, no hepatomegaly, no splenomegaly EXT:  2 plus pulses throughout, no edema, no cyanosis no clubbing SKIN:  No rashes no nodules NEURO:  Cranial nerves II through XII grossly intact, motor grossly intact throughout PSYCH:  Cognitively intact, oriented to person place and time    EKG:  EKG is  *** ordered today. The ekg ordered today demonstrates sinus rhythm, rate ***, axis within normal limits, intervals within normal limits, nonspecific ST-T wave changes.   Recent Labs: 05/22/2019: ALT 18 06/03/2019: Magnesium 1.9 11/27/2019: B Natriuretic Peptide 51.6 11/28/2019:  BUN 22; Creatinine, Ser 0.89; Potassium 3.9; Sodium 138 02/13/2020: Hemoglobin 10.4; Platelets 316    Lipid Panel No results found for: CHOL, TRIG, HDL, CHOLHDL, VLDL, LDLCALC, LDLDIRECT    Wt Readings from Last 3 Encounters:  03/14/20 (!) 304 lb (137.9 kg)  02/15/20 300 lb (136.1 kg)  02/13/20 (!) 306 lb 6.4 oz (139 kg)      Other studies Reviewed: Additional studies/ records that were reviewed today include: *** Review of the above records demonstrates:  Please see elsewhere in the note.     ASSESSMENT AND PLAN:  CHEST PAIN:    ***  Her chest pain is somewhat atypical. However, she has significant cardiovascular risk factors. She would be able to walk on a treadmill. I'm going to screen her with a Lexiscan Myoview. Further evaluation will be based on this.  SOB:   ***   think she has acute diastolic heart failure. We talked about salt restriction. We talked about fluid management. She'll also be managed with blood pressure control as below. I will give her as needed Lasix as she is out of the dose that she was sent home with.  HTN:    ***  We talked at length about weight management. I'm to add  amlodipine 5 mg daily.  OBESITY:  ***  As above. I suggested the Mediterranean diet and a plan for weight loss and activity.    Current medicines are reviewed at length with the patient today.  The patient does not have concerns regarding medicines.  The following changes have been made:  no change  Labs/ tests ordered today include:   No orders of the defined types were placed in this encounter.    Disposition:   FU with me in six weeks.     Signed, Minus Breeding, MD  04/15/2020 10:09 AM    [facility]

## 2020-04-16 ENCOUNTER — Encounter: Payer: Self-pay | Admitting: Allergy & Immunology

## 2020-04-16 ENCOUNTER — Other Ambulatory Visit: Payer: Self-pay

## 2020-04-16 ENCOUNTER — Ambulatory Visit (INDEPENDENT_AMBULATORY_CARE_PROVIDER_SITE_OTHER): Payer: Medicare Other | Admitting: Allergy & Immunology

## 2020-04-16 ENCOUNTER — Telehealth: Payer: Self-pay

## 2020-04-16 ENCOUNTER — Ambulatory Visit: Payer: Medicare Other | Admitting: Cardiology

## 2020-04-16 ENCOUNTER — Ambulatory Visit: Payer: Self-pay

## 2020-04-16 VITALS — BP 120/60 | HR 80 | Temp 96.2°F | Resp 18 | Ht 70.0 in | Wt 305.8 lb

## 2020-04-16 DIAGNOSIS — R0602 Shortness of breath: Secondary | ICD-10-CM

## 2020-04-16 DIAGNOSIS — J449 Chronic obstructive pulmonary disease, unspecified: Secondary | ICD-10-CM

## 2020-04-16 DIAGNOSIS — K219 Gastro-esophageal reflux disease without esophagitis: Secondary | ICD-10-CM | POA: Diagnosis not present

## 2020-04-16 DIAGNOSIS — J3089 Other allergic rhinitis: Secondary | ICD-10-CM

## 2020-04-16 DIAGNOSIS — R072 Precordial pain: Secondary | ICD-10-CM

## 2020-04-16 DIAGNOSIS — I10 Essential (primary) hypertension: Secondary | ICD-10-CM

## 2020-04-16 NOTE — Patient Instructions (Addendum)
1. Severe persistent asthma, uncomplicated - Lung testing looked slightly better today. - Stop the Trelegy. - We are changing you to an all nebulized medications: Pulmicort twice daily, Perforomist twice daily, and Yupelri once daily - Daily controller medication(s): Pulmicort twice daily, Perforomist twice daily, and Yupelri once daily + Singulair 15m daily + Fasenra every 8 weeks - Prior to physical activity: albuterol 2 puffs 10-15 minutes before physical activity. - Rescue medications: albuterol 4 puffs every 4-6 hours as needed, albuterol nebulizer one vial every 4-6 hours as needed or DuoNeb nebulizer one vial every 4-6 hours as needed - Asthma control goals:  * Full participation in all desired activities (may need albuterol before activity) * Albuterol use two time or less a week on average (not counting use with activity) * Cough interfering with sleep two time or less a month * Oral steroids no more than once a year * No hospitalizations  2. Chronic rhinitis (dust mites, molds) - Continue with Flonase one spray per nostril twice daily to  - Continue with Xyzal (levocetirizine) 526mdaily.control any postnasal drip.   3. Gastroesophageal reflux disease - Continue with Protonix 4087mnce daily to control any silent reflux.  4. Return in about 3 months (around 07/17/2020).    Please inform us [place] any Emergency Department visits, hospitalizations, or changes in symptoms. Call us [place] going to the ED for breathing or allergy symptoms since we might be able to fit you in for a sick visit. Feel free to contact us [place] with any questions, problems, or concerns.  It was a pleasure to see you again today!  Websites that have reliable patient information: 1. American Academy of Asthma, Allergy, and Immunology: www.aaaai.org 2. Food Allergy Research and Education (FARE): foodallergy.org 3. Mothers of Asthmatics: http://www.asthmacommunitynetwork.org 4. American College of Allergy,  Asthma, and Immunology: www.acaai.org   COVID-19 Vaccine Information can be found at: [email or web address] questions related to vaccine distribution or appointments, please email [email or web address] or call [phone]   "Like" us [place] Facebook and Instagram for our latest updates!        Make sure you are registered to vote! If you have moved or change d any of your contact information, you will need to get this updated before voting!  In some cases, you MAY be able to register to vote online: [email or web address]

## 2020-04-16 NOTE — Progress Notes (Signed)
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 is maintaining her breathing status. She is doing very well on the regimen, at least from a spirometric standpoint. However, she reports still having some shortness of breath. We are going to do a little experiment and try changing her medications to all nebulized. I cannot imagine this will make much difference, we will give her tomorrow and see how she feels. We are going to continue on the [place] for now, as she does feel that this has provided some relief. I think allergy shots would add much to her control at this point in time, but we can do this in the future.  Plan/Recommendations:    1. Severe persistent asthma, uncomplicated - Lung testing looked slightly better today. - Stop the Trelegy. - We are changing you to an all nebulized medications: Pulmicort twice daily, Perforomist twice daily, and Yupelri once daily - Daily controller medication(s): Pulmicort twice daily, Perforomist twice daily, and Yupelri once daily + Singulair 32m daily + Fasenra every 8 weeks - Prior to physical activity: albuterol 2 puffs 10-15 minutes before physical activity. - Rescue medications: albuterol 4 puffs every 4-6 hours as needed, albuterol nebulizer one vial every 4-6 hours as needed or DuoNeb nebulizer one vial every 4-6 hours as needed - Asthma control goals:  * Full participation in all desired activities (may need albuterol before activity) * Albuterol use two time or less a week on average (not counting use with activity) * Cough interfering with sleep two time or less a month * Oral steroids no more than once a year * No hospitalizations  2. Chronic  rhinitis (dust mites, molds) - Continue with Flonase one spray per nostril twice daily to  - Continue with Xyzal (levocetirizine) 559mdaily.control any postnasal drip.   3. Gastroesophageal reflux disease - Continue with Protonix 4051mnce daily to control any silent reflux.  4. Return in about 3 months (around 07/17/2020).    Subjective:   JanDaizha Anand a 64 43o. female presenting today for follow up of  Chief Complaint  Patient presents with  . Asthma    Coughing and short of breath, No wheexing. short winded    JanMaurene Hollins a history of the following: Patient Active Problem List   Diagnosis Date Noted  . Gastroesophageal reflux disease 03/14/2020  . OSA (obstructive sleep apnea) 02/15/2020  . Abnormal findings on diagnostic imaging of lung 01/08/2020  . Educated about COVID-19 virus infection 11/30/2019  . Aortic atherosclerosis (HCCSebastian6/17/2021  . Chest pain 11/26/2019  . Sleep apnea   . Acute asthma exacerbation 05/22/2019  . Left radial head fracture 02/03/2019  . Closed fracture of radial head 01/29/2019  . Prediabetes 10/19/2018  . 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) (HCCAcworth5/11/2018  . Insomnia 10/19/2018  . Elbow pain, chronic, left 10/19/2018  . Asthma 05/29/2018  . COPD exacerbation (HCCHazelton0/22/2019  . Dyspnea 10/12/2017  . Asthma attack 07/11/2017  . Pancolitis ([facility]2/11/2016  . H/O Spinal surgery 01/18/2015  . Essential hypertension 01/18/2015  . Obesity 01/18/2015   Pulmonologist: BriWyn QuakerP Cardiologist: Dr. JamMinus Breedingistory obtained from: chart review and patient.  JanAleila a 64 51o. female presenting for  a follow up visit. She was last seen in August 2021. At that time, lung testing looked   Asthma/Respiratory Symptom History: She is using the Trelegy every day, but she is still having some SOB when she is walking around. She is having some heaviness on the left side with tighthess. She has  been on the [place] and feels that this has helped with the wheezing. She is only having SOB. She can be fatigued even without any movement around. Rescue inhaler use is twice daily. She does use the albuterol nebulizer twice daily.    She is back on her Protonix. This did not affect her shortness of breath at all. She is using the pickle (flutter valve) and she is getting a lot of stuff up from her lungs.   She is seeing her heart doctor. She is wondering if she has signs of heart failure. She apparently has seen a cardiologist, so I deferred this question. She is followed by Dr. Minus Breeding, last visit June 2021. I reviewed the note and she is diagnosed with diastolic heart failure and fluid management was discussed. Apparently she is taking Lasix.   Allergic Rhinitis Symptom History: She remains on Flonase one spray per nostril daily as well as levocetirizine 46m daily. She has not needed antibiotics at all. She is doing fairly well from this perspective.  GERD Symptom History: She remains on the Protonix 435monce daily. She was off of it at one point but generally she is doing fairly well.   Otherwise, there have been no changes to her past medical history, surgical history, family history, or social history.    Review of Systems  Constitutional: Negative.  Negative for chills, fever, malaise/fatigue and weight loss.  HENT: Positive for sinus pain. Negative for congestion, ear discharge and ear pain.   Eyes: Negative for pain, discharge and redness.  Respiratory: Positive for cough and shortness of breath. Negative for sputum production and wheezing.   Cardiovascular: Negative.  Negative for chest pain and palpitations.  Gastrointestinal: Negative for abdominal pain, constipation, diarrhea, heartburn, nausea and vomiting.  Skin: Negative.  Negative for itching and rash.  Neurological: Negative for dizziness and headaches.  Endo/Heme/Allergies: Positive for environmental allergies. Does  not bruise/bleed easily.       Objective:   Blood pressure 120/60, pulse 80, temperature (!) 96.2 F (35.7 C), resp. rate 18, height 5' 10"  (1.778 m), weight (!) 305 lb 12.8 oz (138.7 kg), SpO2 98 %. Body mass index is 43.88 kg/m.   Physical Exam:  Physical Exam Constitutional:      Appearance: She is well-developed.     Comments: Very adorable as always.  HENT:     Head: Normocephalic and atraumatic.     Right Ear: Tympanic membrane, ear canal and external ear normal.     Left Ear: Tympanic membrane, ear canal and external ear normal.     Nose: No nasal deformity, septal deviation, mucosal edema or rhinorrhea.     Right Turbinates: Enlarged and swollen.     Left Turbinates: Enlarged and swollen.     Right Sinus: No maxillary sinus tenderness or frontal sinus tenderness.     Left Sinus: No maxillary sinus tenderness or frontal sinus tenderness.     Mouth/Throat:     Mouth: Mucous membranes are not pale and not dry.     Pharynx: Uvula midline.  Eyes:     General:        Right eye: No discharge.  Left eye: No discharge.     Conjunctiva/sclera: Conjunctivae normal.     Right eye: Right conjunctiva is not injected. No chemosis.    Left eye: Left conjunctiva is not injected. No chemosis.    Pupils: Pupils are equal, round, and reactive to light.  Cardiovascular:     Rate and Rhythm: Normal rate and regular rhythm.     Heart sounds: Normal heart sounds.  Pulmonary:     Effort: Pulmonary effort is normal. No tachypnea, accessory muscle usage or respiratory distress.     Breath sounds: Normal breath sounds. No wheezing, rhonchi or rales.     Comments: Moving air well in all lung fields. No increased work of breathing noted. Chest:     Chest wall: No tenderness.  Lymphadenopathy:     Cervical: No cervical adenopathy.  Skin:    General: Skin is warm.     Capillary Refill: Capillary refill takes less than 2 seconds.     Coloration: Skin is not pale.     Findings: No  abrasion, erythema, petechiae or rash. Rash is not papular, urticarial or vesicular.     Comments: No eczematous or urticarial lesions noted.   Neurological:     Mental Status: She is alert.      Diagnostic studies:    Spirometry: results abnormal (FEV1: 1.22/49%, FVC: 1.72/54%, FEV1/FVC: 71%).    Spirometry consistent with possible restrictive disease.   Allergy Studies: none      Salvatore Marvel, MD  [facility] [place] [facility] [place]

## 2020-04-16 NOTE — Telephone Encounter (Signed)
Per Dr. Percival Spanish, called to see if pt would be attending her office appointment today. Left message to call back.

## 2020-04-17 DIAGNOSIS — J449 Chronic obstructive pulmonary disease, unspecified: Secondary | ICD-10-CM | POA: Diagnosis not present

## 2020-04-18 ENCOUNTER — Other Ambulatory Visit: Payer: Self-pay | Admitting: *Deleted

## 2020-04-18 ENCOUNTER — Telehealth: Payer: Self-pay | Admitting: Allergy & Immunology

## 2020-04-18 MED ORDER — BUDESONIDE 0.5 MG/2ML IN SUSP: 0.5000 mg | Freq: Two times a day (BID) | RESPIRATORY_TRACT | 2 refills | Status: DC

## 2020-04-18 MED ORDER — YUPELRI 175 MCG/3ML IN SOLN: 175.0000 ug | Freq: Every day | RESPIRATORY_TRACT | 2 refills | Status: DC

## 2020-04-18 MED ORDER — FORMOTEROL FUMARATE 20 MCG/2ML IN NEBU: 20.0000 ug | INHALATION_SOLUTION | Freq: Two times a day (BID) | RESPIRATORY_TRACT | 2 refills | Status: DC

## 2020-04-18 MED ORDER — MONTELUKAST SODIUM 10 MG PO TABS: 10.0000 mg | ORAL_TABLET | Freq: Every day | ORAL | 2 refills | Status: DC

## 2020-04-18 MED ORDER — ALBUTEROL SULFATE (2.5 MG/3ML) 0.083% IN NEBU: 2.5000 mg | INHALATION_SOLUTION | Freq: Four times a day (QID) | RESPIRATORY_TRACT | 1 refills | Status: DC | PRN

## 2020-04-18 NOTE — Telephone Encounter (Signed)
Patient states albuterol for her nebulizer, Singulair and Perforomist were suppose to be called into [facility] on [address].  Patient also wanted to know if the office had an samples of Perforomist.  Please advise.

## 2020-04-18 NOTE — Telephone Encounter (Signed)
All medications have been sent in. Checked and unfortunately did not have any samples of Perforomist. Called patient and advise.d Patient verbalized understanding.

## 2020-04-30 ENCOUNTER — Other Ambulatory Visit: Payer: Self-pay

## 2020-04-30 ENCOUNTER — Ambulatory Visit (INDEPENDENT_AMBULATORY_CARE_PROVIDER_SITE_OTHER): Payer: Medicare Other | Admitting: Pulmonary Disease

## 2020-04-30 ENCOUNTER — Encounter: Payer: Self-pay | Admitting: Pulmonary Disease

## 2020-04-30 ENCOUNTER — Other Ambulatory Visit: Payer: Self-pay | Admitting: Pulmonary Disease

## 2020-04-30 VITALS — BP 124/72 | HR 98 | Temp 97.1°F | Ht 70.0 in | Wt 300.2 lb

## 2020-04-30 DIAGNOSIS — Z79899 Other long term (current) drug therapy: Secondary | ICD-10-CM

## 2020-04-30 DIAGNOSIS — G4733 Obstructive sleep apnea (adult) (pediatric): Secondary | ICD-10-CM

## 2020-04-30 DIAGNOSIS — K219 Gastro-esophageal reflux disease without esophagitis: Secondary | ICD-10-CM

## 2020-04-30 DIAGNOSIS — J4551 Severe persistent asthma with (acute) exacerbation: Secondary | ICD-10-CM | POA: Diagnosis not present

## 2020-04-30 DIAGNOSIS — J449 Chronic obstructive pulmonary disease, unspecified: Secondary | ICD-10-CM | POA: Diagnosis not present

## 2020-04-30 HISTORY — DX: Other long term (current) drug therapy: Z79.899

## 2020-04-30 MED ORDER — TRELEGY ELLIPTA 200-62.5-25 MCG/INH IN AEPB: 1.0000 | INHALATION_SPRAY | Freq: Every day | RESPIRATORY_TRACT | 0 refills | Status: DC

## 2020-04-30 MED ORDER — TRELEGY ELLIPTA 200-62.5-25 MCG/INH IN AEPB: 1.0000 | INHALATION_SPRAY | Freq: Every day | RESPIRATORY_TRACT | 5 refills | Status: DC

## 2020-04-30 MED ORDER — PREDNISONE 10 MG PO TABS: ORAL_TABLET | ORAL | 0 refills | Status: DC

## 2020-04-30 NOTE — Assessment & Plan Note (Signed)
Plan: Continue CPAP therapy Encourage patient to try to utilize CPAP for over 4 hours a night Congratulated patient on her hard work of utilizing CPAP every day

## 2020-04-30 NOTE — Progress Notes (Signed)
@Patient  ID: Patty Howard, female    DOB: January 15, 1956, 64 y.o.   MRN: [id number]  Chief Complaint  Patient presents with  . Follow-up    Asthma, OSA, doing good    Referring provider: Buzzy Han*  HPI:  64 year old female former smoker followed in our office for obstructive lung disease as well as severe obstructive sleep apnea.  PMH: Prediabetes, hypertension, aortic arthrosclerosis Smoker/ Smoking History: Former smoker.  Quit 2000.  5-pack-year smoking history Maintenance: Trelegy 200, Fasenra, Singulair Pt of: Dr. Jenetta Downer  04/30/2020  - Visit   64 year old female former smoker found our office for struct of lung disease and severe obstructive sleep apnea.  She presented to her office today with acute worsened cough and congestion. She remains adherent to utilizing her CPAP.  Although she is still struggling with obtaining over 4 hours of use each day.  CPAP compliance report listed below:  03/31/2020-04/29/2020-CPAP compliance report-30 on the last 30 days use, 14 of those days greater than 4 hours, average usage 4 hours and 18 minutes, APAP setting 5-18, 95th percentile 11.5, AHI 2.6  Patient is also jointly managed by allergy asthma.  She was last seen by Dr. Ernst Bowler earlier this month.  See the assessment and plan listed below:  1. Severe persistent asthma, uncomplicated - Lung testing looked slightly better today. - Stop the Trelegy. - We are changing you to an all nebulized medications: Pulmicort twice daily, Perforomist twice daily, and Yupelri once daily - Daily controller medication(s): Pulmicort twice daily, Perforomist twice daily, and Yupelri once daily + Singulair 67m daily + Fasenra every 8 weeks - Prior to physical activity: albuterol 2 puffs 10-15 minutes before physical activity. - Rescue medications: albuterol 4 puffs every 4-6 hours as needed, albuterol nebulizer one vial every 4-6 hours as needed or DuoNeb nebulizer one vial every 4-6 hours  as needed - Asthma control goals:  * Full participation in all desired activities (may need albuterol before activity) * Albuterol use two time or less a week on average (not counting use with activity) * Cough interfering with sleep two time or less a month * Oral steroids no more than once a year * No hospitalizations  2. Chronic rhinitis (dust mites, molds) - Continue with Flonase one spray per nostril twice daily to  - Continue with Xyzal (levocetirizine) 539mdaily.control any postnasal drip.   3. Gastroesophageal reflux disease - Continue with Protonix 4014mnce daily to control any silent reflux.  4. Return in about 3 months (around 07/17/2020).   Patient reporting today that she was unable to afford the nebulized medications that were prescribed at this visit.  She is requesting refills of her Trelegy Ellipta today.  Patient reporting she ran out of Trelegy Ellipta 3 days ago.  She has had increased shortness of breath, cough, congestion over the last 3 days.  She also feels that she has had some audible wheezing.  She feels that the cough is consistent throughout the day.  She does not report any worsened acid reflux or coughing after eating.  She has some clear nasal drainage that she blows out of her nose.  When she does cough up mucus it is typically white sometimes with yellowish tint.  She denies any fevers.   Questionaires / Pulmonary Flowsheets:   ACT:  Asthma Control Test ACT Total Score  04/30/2020 13  04/16/2020 6  03/14/2020 11    MMRC: No flowsheet data found.  Epworth:  Results of the Epworth flowsheet 04/05/2019  Sitting and reading 3  Watching TV 3  Sitting, inactive in a public place (e.g. a theatre or a meeting) 3  As a passenger in a car for an hour without a break 3  Lying down to rest in the afternoon when circumstances permit 3  Sitting and talking to someone 3  Sitting quietly after a lunch without alcohol 3  In a car, while stopped for a few  minutes in traffic 3  Total score 24    Tests:   05/31/2019-home sleep study-AHI 32.5, SaO2 low 77%  10/31/2019-spirometry-FVC 1.76 (54% predicted), ratio 72, FEV1 1.27 (50% predicted)  02/13/2020-IgE-43 02/13/2020-CBC with differential-eosinophils 0   FENO:  No results found for: NITRICOXIDE  PFT: No flowsheet data found.  WALK:  No flowsheet data found.  Imaging: No results found.  Lab Results:  CBC    Component Value Date/Time   WBC 6.6 02/13/2020 1554   WBC 13.0 (H) 11/28/2019 0429   RBC 4.09 02/13/2020 1554   RBC 4.10 11/28/2019 0429   HGB 10.4 (L) 02/13/2020 1554   HCT 34.0 02/13/2020 1554   PLT 316 02/13/2020 1554   MCV 83 02/13/2020 1554   MCH 25.4 (L) 02/13/2020 1554   MCH 26.1 11/28/2019 0429   MCHC 30.6 (L) 02/13/2020 1554   MCHC 30.6 11/28/2019 0429   RDW 14.0 02/13/2020 1554   LYMPHSABS 2.3 02/13/2020 1554   MONOABS 0.4 06/03/2019 2220   EOSABS 0.0 02/13/2020 1554   BASOSABS 0.0 02/13/2020 1554    BMET    Component Value Date/Time   NA 138 11/28/2019 0429   K 3.9 11/28/2019 0429   CL 99 11/28/2019 0429   CO2 28 11/28/2019 0429   GLUCOSE 135 (H) 11/28/2019 0429   BUN 22 11/28/2019 0429   CREATININE 0.89 11/28/2019 0429   CALCIUM 8.9 11/28/2019 0429   GFRNONAA >60 11/28/2019 0429   GFRAA >60 11/28/2019 0429    BNP    Component Value Date/Time   BNP 51.6 11/27/2019 0413    ProBNP No results found for: PROBNP  Specialty Problems      Pulmonary Problems   Asthma attack   Dyspnea   COPD exacerbation ([place])   Asthma   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 Banana Hives and Itching    Immunization History  Administered Date(s) Administered  . Influenza, High Dose Seasonal PF 03/06/2019  . Influenza,inj,Quad PF,6+ Mos 04/07/2018  . Influenza-Unspecified  03/29/2017, 03/05/2020  . Moderna SARS-COVID-2 Vaccination 08/19/2019, 09/14/2019  . Pneumococcal Polysaccharide-23 03/29/2017  . Tdap 10/30/2017  . Zoster Recombinat (Shingrix) 03/30/2019    Past Medical History:  Diagnosis Date  . Anxiety   . Arthritis   . Asthma   . COPD (chronic obstructive pulmonary disease) ([place])   . Fracture    closed displaced left radial head  . Hypertension   . Sleep apnea    does not wear CPAP    Tobacco History: Social History   Tobacco Use  Smoking Status Former Smoker  . Packs/day: 0.50  . Years: 10.00  . Pack years: 5.00  . Types: Cigarettes  . Quit date: 06/15/1998  . Years since quitting: 21.8  Smokeless Tobacco Never Used   Counseling given: Yes   Continue to not smoke  Outpatient Encounter Medications as of 04/30/2020  Medication Sig  .  albuterol (PROVENTIL) (2.5 MG/3ML) 0.083% nebulizer solution Take 3 mLs (2.5 mg total) by nebulization every 6 (six) hours as needed for wheezing or shortness of breath.  Marland Kitchen albuterol (VENTOLIN HFA) 108 (90 Base) MCG/ACT inhaler INHALE 1 TO 2 PUFFS INTO THE LUNGS EVERY 6 HOURS AS NEEDED FOR WHEEZING OR SHORTNESS OF BREATH  . aspirin EC 81 MG tablet Take 81 mg by mouth daily. Swallow whole.  Marland Kitchen atorvastatin (LIPITOR) 10 MG tablet Take 10 mg by mouth daily.  . budesonide (PULMICORT) 0.5 MG/2ML nebulizer solution Take 2 mLs (0.5 mg total) by nebulization 2 (two) times daily.  . bumetanide (BUMEX) 2 MG tablet Take 2 mg by mouth daily.  . cyclobenzaprine (FLEXERIL) 10 MG tablet Take 10 mg by mouth 2 (two) times daily as needed for muscle spasms.   . Fluticasone-Umeclidin-Vilant (TRELEGY ELLIPTA) 200-62.5-25 MCG/INH AEPB Inhale 1 puff into the lungs daily.  . formoterol (PERFOROMIST) 20 MCG/2ML nebulizer solution Take 2 mLs (20 mcg total) by nebulization 2 (two) times daily.  Marland Kitchen HYDROcodone-acetaminophen (NORCO/VICODIN) 5-325 MG tablet Take 1 tablet by mouth every 6 (six) hours as needed.  Marland Kitchen  ipratropium-albuterol (DUONEB) 0.5-2.5 (3) MG/3ML SOLN Inhale 3 mLs into the lungs every 6 (six) hours as needed (wheezing).  Marland Kitchen levocetirizine (XYZAL) 5 MG tablet Take 1 tablet (5 mg total) by mouth every evening.  Marland Kitchen losartan-hydrochlorothiazide (HYZAAR) 100-25 MG tablet Take 1 tablet by mouth daily.  . montelukast (SINGULAIR) 10 MG tablet Take 1 tablet (10 mg total) by mouth at bedtime.  . Multiple Vitamin (MULTIVITAMIN WITH MINERALS) TABS tablet Take 1 tablet by mouth daily. Centrum Multivitamin  . pantoprazole (PROTONIX) 40 MG tablet Take 1 tablet (40 mg total) by mouth daily.  . revefenacin (YUPELRI) 175 MCG/3ML nebulizer solution Take 3 mLs (175 mcg total) by nebulization daily.  . Vitamin D, Ergocalciferol, (DRISDOL) 1.25 MG (50000 UNIT) CAPS capsule Take 50,000 Units by mouth every Monday.   . zolpidem (AMBIEN) 10 MG tablet Take 10 mg by mouth at bedtime as needed for sleep.  . [DISCONTINUED] Fluticasone-Umeclidin-Vilant (TRELEGY ELLIPTA) 200-62.5-25 MCG/INH AEPB Inhale 1 puff into the lungs daily.  Marland Kitchen amLODipine (NORVASC) 5 MG tablet Take 1 tablet (5 mg total) by mouth daily.  . furosemide (LASIX) 40 MG tablet Take 1 tablet (40 mg total) by mouth daily as needed for fluid or edema (WEIGHT GAIN OF 2LBS, SHORTNESS OF BREATH).  Marland Kitchen predniSONE (DELTASONE) 10 MG tablet 4 tabs for 2 days, then 3 tabs for 2 days, 2 tabs for 2 days, then 1 tab for 2 days, then stop   Facility-Administered Encounter Medications as of 04/30/2020  Medication  . Benralizumab SOSY 30 mg     Review of Systems  Review of Systems  Constitutional: Positive for fatigue. Negative for activity change and fever.  HENT: Positive for congestion (yellow). Negative for sinus pressure, sinus pain and sore throat.   Respiratory: Positive for cough and shortness of breath. Negative for wheezing.   Cardiovascular: Negative for chest pain and palpitations.  Gastrointestinal: Negative for diarrhea, nausea and vomiting.   Musculoskeletal: Negative for arthralgias.  Neurological: Negative for dizziness.  Psychiatric/Behavioral: Negative for sleep disturbance. The patient is not nervous/anxious.      Physical Exam  BP 124/72 (BP Location: Left Arm, Cuff Size: Normal)   Pulse 98   Temp (!) 97.1 F (36.2 C) (Oral)   Ht 5' 10"  (1.778 m)   Wt (!) 300 lb 3.2 oz (136.2 kg)   SpO2 99%   BMI  43.07 kg/m   Wt Readings from Last 5 Encounters:  04/30/20 (!) 300 lb 3.2 oz (136.2 kg)  04/16/20 (!) 305 lb 12.8 oz (138.7 kg)  03/14/20 (!) 304 lb (137.9 kg)  02/15/20 300 lb (136.1 kg)  02/13/20 (!) 306 lb 6.4 oz (139 kg)    BMI Readings from Last 5 Encounters:  04/30/20 43.07 kg/m  04/16/20 43.88 kg/m  03/14/20 43.62 kg/m  02/15/20 43.05 kg/m  02/13/20 43.96 kg/m     Physical Exam Vitals and nursing note reviewed.  Constitutional:      General: She is not in acute distress.    Appearance: Normal appearance.  HENT:     Head: Normocephalic and atraumatic.     Right Ear: External ear normal.     Left Ear: External ear normal.     Nose: Rhinorrhea present. No congestion.     Mouth/Throat:     Mouth: Mucous membranes are moist.     Pharynx: Oropharynx is clear.     Comments: Postnasal drip Eyes:     Pupils: Pupils are equal, round, and reactive to light.  Cardiovascular:     Rate and Rhythm: Normal rate and regular rhythm.     Pulses: Normal pulses.     Heart sounds: Normal heart sounds. No murmur heard.   Pulmonary:     Effort: Pulmonary effort is normal. No respiratory distress.     Breath sounds: No decreased air movement. Wheezing (Expiratory wheeze) present. No decreased breath sounds or rales.  Musculoskeletal:     Cervical back: Normal range of motion.  Skin:    General: Skin is warm and dry.     Capillary Refill: Capillary refill takes less than 2 seconds.  Neurological:     General: No focal deficit present.     Mental Status: She is alert and oriented to person, place, and  time. Mental status is at baseline.     Gait: Gait normal.  Psychiatric:        Mood and Affect: Mood normal.        Behavior: Behavior normal.        Thought Content: Thought content normal.        Judgment: Judgment normal.       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  Patient reports that she was unable to afford these.  She reports the insurance cost was around $800 a month.  Unfortunately she ran out of Trelegy Ellipta and did not notify allergy asthma or our office with this.  She is been off of Trelegy Ellipta for at least 3 days.  She has had acute worsening symptoms since then.  We discussed today the importance of making sure that she notifies our office as well as allergy asthma if we ever prescribed medications as she is unable to afford so that way we can work with her to rectify the situation.  It is extremely important that she is maintained on  maintenance medications for her asthma.  I have reviewed this with her.  I explained to her that when she is not taking her Trelegy Ellipta as she is essentially off of 3 medications to help her with her breathing.    Return in about 4 weeks (around 05/28/2020), or if symptoms worsen or fail to improve, for Follow up with Dr. Ander Slade.   Lauraine Rinne, NP 04/30/2020   This appointment required  31 minutes of patient care (this includes precharting, chart review, review of results, face-to-face care, etc.).

## 2020-04-30 NOTE — Addendum Note (Signed)
Addended by: Coralie Keens on: 04/30/2020 03:56 PM   Modules accepted: Orders

## 2020-04-30 NOTE — Assessment & Plan Note (Signed)
Plan: Prednisone taper today Resume Trelegy Ellipta 200 Continue follow-up with allergy asthma Continue Fasenra Continue Singulair Continue Xyzal

## 2020-04-30 NOTE — Patient Instructions (Addendum)
You were seen today by Lauraine Rinne, NP  for:   1. Severe persistent asthma with acute exacerbation  - predniSONE (DELTASONE) 10 MG tablet; 4 tabs for 2 days, then 3 tabs for 2 days, 2 tabs for 2 days, then 1 tab for 2 days, then stop  Dispense: 20 tablet; Refill: 0  2. 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) ([place])  - Flutter valve; Future - predniSONE (DELTASONE) 10 MG tablet; 4 tabs for 2 days, then 3 tabs for 2 days, 2 tabs for 2 days, then 1 tab for 2 days, then stop  Dispense: 20 tablet; Refill: 0  Trelegy Ellipta 200 >>> 1 puff daily in the morning >>>rinse mouth out after use  >>> This inhaler contains 3 medications that help manage her respiratory status, contact our office if you cannot afford this medication or unable to remain on this medication  We will provide you a sample today  We have sent in a prescription to the pharmacy  Note your daily symptoms > remember "red flags" for Asthma/COPD:   >>>Increase in cough >>>increase in sputum production >>>increase in shortness of breath or activity  intolerance.   If you notice these symptoms, please call the office to be seen.   3. Medication management  In the future if you run out of your maintenance medications it is very important that you notify allergy asthma as well as our office.  There is never the intent of allergy asthma for you to be off of Trelegy Ellipta completely if you are unable to start nebulized meds.  We need to notify their office if this ever happens again when they are prescribing the medication that you cannot afford.   If we ever prescribed medication here if you cannot afford it we need to be notified immediately so that way we can be aware that you will not be on it.  4. OSA (obstructive sleep apnea)  As reviewed today please work on trying to utilize your CPAP for over 4 hours a night, great work at utilizing it every 9!  We recommend that you continue using your CPAP  daily >>>Keep up the hard work using your device >>> Goal should be wearing this for the entire night that you are sleeping, at least 4 to 6 hours  Remember:  . Do not drive or operate heavy machinery if tired or drowsy.  . Please notify the supply company and office if you are unable to use your device regularly due to missing supplies or machine being broken.  . Work on maintaining a healthy weight and following your recommended nutrition plan  . Maintain proper daily exercise and movement  . Maintaining proper use of your device can also help improve management of other chronic illnesses such as: Blood pressure, blood sugars, and weight management.   BiPAP/ CPAP Cleaning:  >>>Clean weekly, with Dawn soap, and bottle brush.  Set up to air dry. >>> Wipe mask out daily with wet wipe or towelette    We recommend today:  Orders Placed This Encounter  Procedures  . Flutter valve    Standing Status:   Future    Standing Expiration Date:   04/30/2021   Orders Placed This Encounter  Procedures  . Flutter valve   Meds ordered this encounter  Medications  . Fluticasone-Umeclidin-Vilant (TRELEGY ELLIPTA) 200-62.5-25 MCG/INH AEPB    Sig: Inhale 1 puff into the lungs daily.    Dispense:  30 each    Refill:  5  . predniSONE (DELTASONE) 10 MG tablet    Sig: 4 tabs for 2 days, then 3 tabs for 2 days, 2 tabs for 2 days, then 1 tab for 2 days, then stop    Dispense:  20 tablet    Refill:  0    Follow Up:    Return in about 4 weeks (around 05/28/2020), or if symptoms worsen or fail to improve, for Follow up with Dr. Ander Slade.   Notification of test results are managed in the following manner: If there are  any recommendations or changes to the  plan of care discussed in office today,  we will contact you and let you know what they are. If you do not hear from [place], then your results are normal and you can view them through your  MyChart account , or a letter will be sent to you. Thank you  again for trusting [place] with your care  - Thank you, [facility]    It is flu season:   >>> Best ways to protect herself from the flu: Receive the yearly flu vaccine, practice good hand hygiene washing with soap and also using hand sanitizer when available, eat a nutritious meals, get adequate rest, hydrate appropriately       Please contact the office if your symptoms worsen or you have concerns that you are not improving.   Thank you for choosing [facility] for your healthcare, and for allowing [place] to partner with you on your healthcare journey. I am thankful to be able to provide care to you today.   Wyn Quaker FNP-C

## 2020-04-30 NOTE — Assessment & Plan Note (Signed)
Allergy asthma try to switch patient to triple therapy maintenance nebulized meds.  Patient reports that she was unable to afford these.  She reports the insurance cost was around $800 a month.  Unfortunately she ran out of Trelegy Ellipta and did not notify allergy asthma or our office with this.  She is been off of Trelegy Ellipta for at least 3 days.  She has had acute worsening symptoms since then.  We discussed today the importance of making sure that she notifies our office as well as allergy asthma if we ever prescribed medications as she is unable to afford so that way we can work with her to rectify the situation.  It is extremely important that she is maintained on maintenance medications for her asthma.  I have reviewed this with her.  I explained to her that when she is not taking her Trelegy Ellipta as she is essentially off of 3 medications to help her with her breathing.

## 2020-04-30 NOTE — Assessment & Plan Note (Signed)
Plan: Continue Protonix

## 2020-04-30 NOTE — Assessment & Plan Note (Signed)
Plan: Prednisone taper today Resume Trelegy Ellipta 200

## 2020-05-02 ENCOUNTER — Ambulatory Visit: Payer: Medicare Other | Admitting: Pulmonary Disease

## 2020-05-03 ENCOUNTER — Other Ambulatory Visit: Payer: Self-pay

## 2020-05-03 ENCOUNTER — Ambulatory Visit (INDEPENDENT_AMBULATORY_CARE_PROVIDER_SITE_OTHER): Payer: Medicare Other

## 2020-05-03 DIAGNOSIS — Z23 Encounter for immunization: Secondary | ICD-10-CM | POA: Diagnosis not present

## 2020-05-07 ENCOUNTER — Other Ambulatory Visit: Payer: Self-pay | Admitting: Allergy & Immunology

## 2020-05-22 ENCOUNTER — Encounter: Payer: Self-pay | Admitting: Pulmonary Disease

## 2020-05-22 ENCOUNTER — Other Ambulatory Visit: Payer: Self-pay

## 2020-05-22 ENCOUNTER — Ambulatory Visit (INDEPENDENT_AMBULATORY_CARE_PROVIDER_SITE_OTHER): Payer: Medicare Other | Admitting: Pulmonary Disease

## 2020-05-22 VITALS — BP 134/78 | HR 88 | Temp 97.4°F | Ht 70.0 in | Wt 301.0 lb

## 2020-05-22 DIAGNOSIS — R918 Other nonspecific abnormal finding of lung field: Secondary | ICD-10-CM

## 2020-05-22 MED ORDER — ALBUTEROL SULFATE HFA 108 (90 BASE) MCG/ACT IN AERS: 2.0000 | INHALATION_SPRAY | Freq: Four times a day (QID) | RESPIRATORY_TRACT | 1 refills | Status: DC | PRN

## 2020-05-22 MED ORDER — IPRATROPIUM-ALBUTEROL 0.5-2.5 (3) MG/3ML IN SOLN: 3.0000 mL | Freq: Four times a day (QID) | RESPIRATORY_TRACT | 1 refills | Status: DC | PRN

## 2020-05-22 MED ORDER — LOSARTAN POTASSIUM-HCTZ 100-25 MG PO TABS
1.0000 | ORAL_TABLET | Freq: Every day | ORAL | 0 refills | Status: DC
Start: 2020-05-22 — End: 2020-05-24

## 2020-05-22 NOTE — Progress Notes (Signed)
Subjective:    Patient ID: Patty Howard, female    DOB: [birth year], 64 y.o.   MRN: [id number]  Patient being seen for obstructive sleep apnea She does have a history of obstructive lung disease  Relatively well  Currently on Trelegy Exercise tolerance only about a block or more  Shortness of breath, wheezing Compliant with CPAP use  Diagnosed with obstructive sleep apnea about 8 to 10 years ago Her machine became dysfunctional about a year ago  History of obstructive sleep apnea  Sleep time varies significantly sometimes midnight to 3 AM Takes about 30 minutes to fall asleep Almost 4 nocturnal awakenings on her regular basis Final awakening time about 7 AM  Weight has been stable  Has a history of asthma/COPD, history of hypertension  Admits to dryness of her mouth in the mornings Admits to choking episodes at night No headaches in the mornings  Sister with OSA  Her memory is fine  Past Medical History:  Diagnosis Date  . Anxiety   . Arthritis   . Asthma   . COPD (chronic obstructive pulmonary disease) ([place])   . Fracture    closed displaced left radial head  . Hypertension   . Sleep apnea    does not wear CPAP   Social History   Socioeconomic History  . Marital status: Widowed    Spouse name: Not on file  . Number of children: Not on file  . Years of education: Not on file  . Highest education level: Not on file  Occupational History  . Not on file  Tobacco Use  . Smoking status: Former Smoker    Packs/day: 0.50    Years: 10.00    Pack years: 5.00    Types: Cigarettes    Quit date: 06/15/1998    Years since quitting: 21.9  . Smokeless tobacco: Never Used  Vaping Use  . Vaping Use: Never used  Substance and Sexual Activity  . Alcohol use: Never  . Drug use: Never  . Sexual activity: Not on file  Other Topics Concern  . Not on file  Social History Narrative   Three children.  Engineer, structural in [place].     Social Determinants of Health    Financial Resource Strain:   . Difficulty of Paying Living Expenses: Not on file  Food Insecurity:   . Worried About Charity fundraiser in the Last Year: Not on file  . Ran Out of Food in the Last Year: Not on file  Transportation Needs:   . Lack of Transportation (Medical): Not on file  . Lack of Transportation (Non-Medical): Not on file  Physical Activity:   . Days of Exercise per Week: Not on file  . Minutes of Exercise per Session: Not on file  Stress:   . Feeling of Stress : Not on file  Social Connections:   . Frequency of Communication with Friends and Family: Not on file  . Frequency of Social Gatherings with Friends and Family: Not on file  . Attends Religious Services: Not on file  . Active Member of Clubs or Organizations: Not on file  . Attends Archivist Meetings: Not on file  . Marital Status: Not on file  Intimate Partner Violence:   . Fear of Current or Ex-Partner: Not on file  . Emotionally Abused: Not on file  . Physically Abused: Not on file  . Sexually Abused: Not on file   Family History  Problem Relation Age of Onset  .  Hypertension Mother   . Stroke Mother   . Cancer Father     Review of Systems  Constitutional: Negative for fever and unexpected weight change.  HENT: Positive for congestion. Negative for dental problem, ear pain, nosebleeds, postnasal drip, rhinorrhea, sinus pressure, sneezing, sore throat and trouble swallowing.   Eyes: Negative for redness and itching.  Respiratory: Positive for cough, shortness of breath and wheezing. Negative for chest tightness.   Cardiovascular: Negative for palpitations and leg swelling.  Gastrointestinal: Negative for nausea and vomiting.  Genitourinary: Negative for dysuria.  Musculoskeletal: Negative for joint swelling.  Skin: Negative for rash.  Allergic/Immunologic: Positive for environmental allergies and food allergies. Negative for immunocompromised state.  Neurological: Negative for  headaches.  Hematological: Does not bruise/bleed easily.  Psychiatric/Behavioral: Negative for dysphoric mood. The patient is nervous/anxious.        Objective:   Physical Exam Constitutional:      Appearance: She is obese.  HENT:     Head: Normocephalic and atraumatic.  Eyes:     Pupils: Pupils are equal, round, and reactive to light.  Cardiovascular:     Rate and Rhythm: Normal rate.     Pulses: Normal pulses.     Heart sounds: Normal heart sounds. No murmur heard.  No friction rub.  Pulmonary:     Effort: Pulmonary effort is normal. No respiratory distress.     Breath sounds: No stridor. Rhonchi present. No wheezing.  Musculoskeletal:     Cervical back: No rigidity or tenderness.  Skin:    General: Skin is warm.  Neurological:     General: No focal deficit present.     Mental Status: She is alert.  Psychiatric:        Mood and Affect: Mood normal.    Vitals:   05/22/20 0917  BP: 134/78  Pulse: 88  Temp: (!) 97.4 F (36.3 C)  SpO2: 99%   Results of the Epworth flowsheet 04/05/2019  Sitting and reading 3  Watching TV 3  Sitting, inactive in a public place (e.g. a theatre or a meeting) 3  As a passenger in a car for an hour without a break 3  Lying down to rest in the afternoon when circumstances permit 3  Sitting and talking to someone 3  Sitting quietly after a lunch without alcohol 3  In a car, while stopped for a few minutes in traffic 3  Total score 24   Previous study not available to me at present  Continues to be compliant with CPAP use    .  CT scan 11/26/2019 reviewed showing a 5 mm not lung nodule-scans reviewed by myself  -We will repeat in 6 months  Assessment & Plan:  Severe obstructive sleep apnea .  No issues with CPAP .  Daytime sleepiness is better  Obstructive pulmonary disease/asthma .  We will continue bronchodilators .  Continue Trelegy  Morbid obesity .  Encouraged to continue working on weight loss efforts  Plan: .   Continue CPAP therapy .  Continue Trelegy .  Continue nebulization treatments as tolerated and needed .  Weight loss efforts .  We will repeat CT scan in 6 months for a lung nodule  .  Follow-up in 6 months

## 2020-05-22 NOTE — Patient Instructions (Signed)
Prescription sent to pharmacy for you  Graded exercise as tolerated  CT scan of the chest without contrast in 6 months  I will see you after the CAT scan in 6 months  Call with significant concerns

## 2020-05-23 NOTE — Progress Notes (Signed)
Cardiology Office Note   Date:  05/24/2020   ID:  Patty Howard, DOB 1955-10-19, MRN [id number]  PCP:  Buzzy Han, MD  Cardiologist:   No primary care provider on file. Referring:  Buzzy Han, MD  Chief Complaint  Patient presents with  . Shortness of Breath      History of Present Illness: Patty Howard is a 64 y.o. female who presents for evaluation of shortness of breath and chest pain. She was just hospitalized on 6/13-6/15. She has a history of asthma and COPD. 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  She was in the ED with chest pain.  She was treated for diastolic HF:  She had aortic atherosclerosis on CT.   She had chest pain and she had a negative perfusion study.  I treated her with Lasix for possible acute diastolic HF.    She has continued to have some dyspnea with exertion.  She thinks she did feel better after she took Lasix and she had less lower extremity swelling.  However, turns out she has been off of all of her blood pressure medications.  She saw her pulmonologist recently and I reviewed this.  No change in therapy was indicated and it was suggested that she needed weight loss and exercise.  She does have a CT plan in 6 months to follow-up pulmonary nodule.   Past Medical History:  Diagnosis Date  . Anxiety   . Arthritis   . Asthma   . COPD (chronic obstructive pulmonary disease) ([place])   . Fracture    closed displaced left radial head  . Hypertension   . Sleep apnea    does not wear CPAP    Past Surgical History:  Procedure Laterality Date  . BACK SURGERY     spinal stimulator  . COLONOSCOPY W/ BIOPSIES AND POLYPECTOMY    . DILATION AND CURETTAGE OF UTERUS    . LAPAROSCOPIC ROUX-EN-Y GASTRIC BYPASS WITH UPPER ENDOSCOPY AND REMOVAL OF LAP BAND    . RADIAL HEAD ARTHROPLASTY Left 02/03/2019   Procedure: LEFT RADIAL HEAD  ARTHROPLASTY;  Surgeon: [name], MD;  Location: [facility];  Service: Orthopedics;  Laterality: Left;  AXILLARY BLOCK VS BIER BLOCK  . TUBAL LIGATION       Current Outpatient Medications  Medication Sig Dispense Refill  . albuterol (PROVENTIL) (2.5 MG/3ML) 0.083% nebulizer solution Take 3 mLs (2.5 mg total) by nebulization every 6 (six) hours as needed for wheezing or shortness of breath. 75 mL 1  . albuterol (VENTOLIN HFA) 108 (90 Base) MCG/ACT inhaler Inhale 2 puffs into the lungs every 6 (six) hours as needed for wheezing or shortness of breath. 8 each 1  . aspirin EC 81 MG tablet Take 81 mg by mouth daily. Swallow whole.    Marland Kitchen atorvastatin (LIPITOR) 10 MG tablet Take 10 mg by mouth daily.    . bumetanide (BUMEX) 2 MG tablet Take 2 mg by mouth daily.    . cyclobenzaprine (FLEXERIL) 10 MG tablet Take 10 mg by mouth 2 (two) times daily as needed for muscle spasms.     . Fluticasone-Umeclidin-Vilant (TRELEGY ELLIPTA) 200-62.5-25 MCG/INH AEPB Inhale 1 puff into the lungs daily. 30 each 5  . HYDROcodone-acetaminophen (NORCO/VICODIN) 5-325 MG tablet Take 1 tablet by mouth every 6 (six) hours as needed. 10 tablet 0  . ipratropium-albuterol (DUONEB) 0.5-2.5 (3) MG/3ML SOLN Inhale  3 mLs into the lungs every 6 (six) hours as needed (wheezing). 3 mL 1  . levocetirizine (XYZAL) 5 MG tablet Take 1 tablet (5 mg total) by mouth every evening. 90 tablet 1  . montelukast (SINGULAIR) 10 MG tablet Take 1 tablet (10 mg total) by mouth at bedtime. 30 tablet 2  . Multiple Vitamin (MULTIVITAMIN WITH MINERALS) TABS tablet Take 1 tablet by mouth daily. Centrum Multivitamin    . pantoprazole (PROTONIX) 40 MG tablet Take 1 tablet (40 mg total) by mouth daily. 30 tablet 3  . Vitamin D, Ergocalciferol, (DRISDOL) 1.25 MG (50000 UNIT) CAPS capsule Take 50,000 Units by mouth every Monday.     . zolpidem (AMBIEN) 10 MG tablet Take 10 mg by mouth at bedtime as needed for sleep.    Marland Kitchen amLODipine (NORVASC) 5 MG tablet Take 1  tablet (5 mg total) by mouth daily. 90 tablet 3  . budesonide (PULMICORT) 0.5 MG/2ML nebulizer solution Take 2 mLs (0.5 mg total) by nebulization 2 (two) times daily. (Patient not taking: Reported on 05/24/2020) 120 mL 2  . furosemide (LASIX) 40 MG tablet Take 1 tablet (40 mg total) by mouth daily as needed for fluid or edema (WEIGHT GAIN OF 2LBS, SHORTNESS OF BREATH). 30 tablet 3  . losartan-hydrochlorothiazide (HYZAAR) 100-25 MG tablet Take 1 tablet by mouth daily. 90 tablet 3   Current Facility-Administered Medications  Medication Dose Route Frequency Provider Last Rate Last Admin  . Benralizumab SOSY 30 mg  30 mg Subcutaneous Q8 Toney Reil, MD   30 mg at 04/17/20 1510    Allergies:   Banana   ROS:  Please see the history of present illness.   Otherwise, review of systems are positive for none.   All other systems are reviewed and negative.    PHYSICAL EXAM: VS:  BP 136/72   Pulse (!) 102   Ht 5' 10"  (1.778 m)   Wt (!) 301 lb (136.5 kg)   SpO2 98%   BMI 43.19 kg/m  , BMI Body mass index is 43.19 kg/m. GENERAL:  Well appearing NECK:  No jugular venous distention, waveform within normal limits, carotid upstroke brisk and symmetric, no bruits, no thyromegaly LUNGS:  Clear to auscultation bilaterally CHEST:  Unremarkable HEART:  PMI not displaced or sustained,S1 and S2 within normal limits, no S3, no S4, no clicks, no rubs, no murmurs ABD:  Flat, positive bowel sounds normal in frequency in pitch, no bruits, no rebound, no guarding, no midline pulsatile mass, no hepatomegaly, no splenomegaly EXT:  2 plus pulses throughout, mild edema, no cyanosis no clubbing   EKG:  EKG is not ordered today.    Recent Labs: 06/03/2019: Magnesium 1.9 11/27/2019: B Natriuretic Peptide 51.6 11/28/2019: BUN 22; Creatinine, Ser 0.89; Potassium 3.9; Sodium 138 02/13/2020: Hemoglobin 10.4; Platelets 316    Lipid Panel No results found for: CHOL, TRIG, HDL, CHOLHDL, VLDL, LDLCALC,  LDLDIRECT    Wt Readings from Last 3 Encounters:  05/24/20 (!) 301 lb (136.5 kg)  05/22/20 (!) 301 lb (136.5 kg)  04/30/20 (!) 300 lb 3.2 oz (136.2 kg)      Other studies Reviewed: Additional studies/ records that were reviewed today include: Pulm appt Review of the above records demonstrates:  Please see elsewhere in the note.     ASSESSMENT AND PLAN:  CHEST PAIN:   The patient's had no new chest pain.  She had a negative perfusion study.  No change in therapy.   SOB: There probably is some  component of diastolic dysfunction.  Mostly however, this is obesity and physical inactivity.  It is also nonadherence with her medications.  We had a long discussion about this.  I restarted her blood pressure medicines.  She can take her Lasix as needed.  I'm going to have her see our health educator  HTN: I'm going to restart the amlodipine and other meds as above.   OBESITY:   She has morbid obesity.  We discussed the need for diet and activity and again she'll see our health educator.   COVID EDUCATION: She has had her vaccinations.   Current medicines are reviewed at length with the patient today.  The patient does not have concerns regarding medicines.  The following changes have been made:  None  Labs/ tests ordered today include:  NOne  Orders Placed This Encounter  Procedures  . Basic metabolic panel     Disposition:   FU with me in APP in 6 months   Signed, Minus Breeding, MD  05/24/2020 10:22 AM    [facility]

## 2020-05-24 ENCOUNTER — Encounter: Payer: Self-pay | Admitting: Cardiology

## 2020-05-24 ENCOUNTER — Other Ambulatory Visit: Payer: Self-pay

## 2020-05-24 ENCOUNTER — Telehealth: Payer: Self-pay

## 2020-05-24 ENCOUNTER — Ambulatory Visit (INDEPENDENT_AMBULATORY_CARE_PROVIDER_SITE_OTHER): Payer: Medicare Other | Admitting: Cardiology

## 2020-05-24 VITALS — BP 136/72 | HR 102 | Ht 70.0 in | Wt 301.0 lb

## 2020-05-24 DIAGNOSIS — I10 Essential (primary) hypertension: Secondary | ICD-10-CM

## 2020-05-24 DIAGNOSIS — Z79899 Other long term (current) drug therapy: Secondary | ICD-10-CM

## 2020-05-24 DIAGNOSIS — R072 Precordial pain: Secondary | ICD-10-CM

## 2020-05-24 DIAGNOSIS — Z0000 Encounter for general adult medical examination without abnormal findings: Secondary | ICD-10-CM

## 2020-05-24 DIAGNOSIS — R0602 Shortness of breath: Secondary | ICD-10-CM | POA: Diagnosis not present

## 2020-05-24 MED ORDER — AMLODIPINE BESYLATE 5 MG PO TABS: 5.0000 mg | ORAL_TABLET | Freq: Every day | ORAL | 3 refills | Status: DC

## 2020-05-24 MED ORDER — FUROSEMIDE 40 MG PO TABS: 40.0000 mg | ORAL_TABLET | Freq: Every day | ORAL | 3 refills | Status: DC | PRN

## 2020-05-24 MED ORDER — LOSARTAN POTASSIUM-HCTZ 100-25 MG PO TABS
1.0000 | ORAL_TABLET | Freq: Every day | ORAL | 3 refills | Status: DC
Start: 2020-05-24 — End: 2021-10-21

## 2020-05-24 NOTE — Telephone Encounter (Signed)
Called patient per Dr. Percival Spanish to discuss health coaching for medication management and compliance. Left patient a message to return call to Care Guide to discuss matter further.

## 2020-05-24 NOTE — Patient Instructions (Signed)
Medication Instructions:  NO CHANGES *If you need a refill on your cardiac medications before your next appointment, please call your pharmacy*  Lab Work: Your physician recommends that you return for lab work in: 1 MONTH ([place]) If you have labs (blood work) drawn today and your tests are completely normal, you will receive your results only by: Marland Kitchen A paper copy in the mail If you have any lab test that is abnormal or we need to change your treatment, we will call you to review the results.  Testing/Procedures: NONE ORDERED THIS VISIT  Follow-Up: At [facility], you and your health needs are our priority.  As part of our continuing mission to provide you with exceptional heart care, we have created designated Provider Care Teams.  These Care Teams include your primary Cardiologist (physician) and Advanced Practice Providers (APPs -  Physician Assistants and Nurse Practitioners) who all work together to provide you with the care you need, when you need it.  We recommend signing up for the patient portal called "MyChart".  Sign up information is provided on this After Visit Summary.  MyChart is used to connect with patients for Virtual Visits (Telemedicine).  Patients are able to view lab/test results, encounter notes, upcoming appointments, etc.  Non-urgent messages can be sent to your provider as well.   To learn more about what you can do with MyChart, go to [email or web address].    Your next appointment:   6 month(s)  The format for your next appointment:   In Person  Provider:   [place]  Other Instructions CARE COORDINATOR [place]

## 2020-06-03 ENCOUNTER — Telehealth: Payer: Self-pay | Admitting: *Deleted

## 2020-06-03 ENCOUNTER — Telehealth: Payer: Self-pay | Admitting: Cardiology

## 2020-06-03 MED ORDER — IPRATROPIUM-ALBUTEROL 0.5-2.5 (3) MG/3ML IN SOLN: 3.0000 mL | Freq: Four times a day (QID) | RESPIRATORY_TRACT | 1 refills | Status: DC | PRN

## 2020-06-03 NOTE — Telephone Encounter (Signed)
Med refill

## 2020-06-03 NOTE — Telephone Encounter (Signed)
Attempted to call patient, left message for patient to call back to office.   

## 2020-06-03 NOTE — Telephone Encounter (Signed)
*  STAT* If patient is at the pharmacy, call can be transferred to refill team.   1. Which medications need to be refilled? (please list name of each medication and dose if known) furosemide (LASIX) 40 MG tablet; amLODipine (NORVASC) 5 MG tablet  2. Which pharmacy/location (including street and city if local pharmacy) is medication to be sent to? [id number] [place] - [address]  3. Do they need a 30 day or 90 day supply? [address]

## 2020-06-07 ENCOUNTER — Emergency Department (HOSPITAL_COMMUNITY): Payer: Medicare Other

## 2020-06-07 ENCOUNTER — Other Ambulatory Visit: Payer: Self-pay

## 2020-06-07 ENCOUNTER — Emergency Department (HOSPITAL_COMMUNITY)
Admission: EM | Admit: 2020-06-07 | Discharge: 2020-06-07 | Disposition: A | Discharge status: Home / Self Care | Payer: Medicare Other | Attending: Emergency Medicine | Admitting: Emergency Medicine

## 2020-06-07 DIAGNOSIS — J449 Chronic obstructive pulmonary disease, unspecified: Secondary | ICD-10-CM | POA: Insufficient documentation

## 2020-06-07 DIAGNOSIS — M549 Dorsalgia, unspecified: Secondary | ICD-10-CM | POA: Diagnosis present

## 2020-06-07 DIAGNOSIS — I10 Essential (primary) hypertension: Secondary | ICD-10-CM | POA: Diagnosis not present

## 2020-06-07 DIAGNOSIS — M79604 Pain in right leg: Secondary | ICD-10-CM | POA: Insufficient documentation

## 2020-06-07 DIAGNOSIS — M545 Low back pain, unspecified: Secondary | ICD-10-CM | POA: Diagnosis not present

## 2020-06-07 DIAGNOSIS — Z79899 Other long term (current) drug therapy: Secondary | ICD-10-CM | POA: Insufficient documentation

## 2020-06-07 DIAGNOSIS — J45909 Unspecified asthma, uncomplicated: Secondary | ICD-10-CM | POA: Insufficient documentation

## 2020-06-07 DIAGNOSIS — M5431 Sciatica, right side: Secondary | ICD-10-CM

## 2020-06-07 DIAGNOSIS — R52 Pain, unspecified: Secondary | ICD-10-CM

## 2020-06-07 DIAGNOSIS — Z87891 Personal history of nicotine dependence: Secondary | ICD-10-CM | POA: Diagnosis not present

## 2020-06-07 DIAGNOSIS — Z7982 Long term (current) use of aspirin: Secondary | ICD-10-CM | POA: Insufficient documentation

## 2020-06-07 MED ORDER — PREDNISONE 20 MG PO TABS
60.0000 mg | ORAL_TABLET | Freq: Once | ORAL | Status: AC
  Administered 2020-06-07: 60 mg via ORAL
  Filled 2020-06-07: qty 3

## 2020-06-07 MED ORDER — ONDANSETRON 4 MG PO TBDP
4.0000 mg | ORAL_TABLET | Freq: Once | ORAL | Status: DC
  Filled 2020-06-07: qty 1

## 2020-06-07 MED ORDER — LIDOCAINE 5 % EX PTCH
1.0000 | MEDICATED_PATCH | CUTANEOUS | 0 refills | Status: DC
Start: 2020-06-07 — End: 2020-10-01

## 2020-06-07 MED ORDER — IBUPROFEN 800 MG PO TABS
800.0000 mg | ORAL_TABLET | Freq: Once | ORAL | Status: DC
  Filled 2020-06-07: qty 1

## 2020-06-07 MED ORDER — OXYCODONE-ACETAMINOPHEN 5-325 MG PO TABS
2.0000 | ORAL_TABLET | Freq: Once | ORAL | Status: AC
  Administered 2020-06-07: 2 via ORAL
  Filled 2020-06-07: qty 2

## 2020-06-07 MED ORDER — IBUPROFEN 800 MG PO TABS: 800.0000 mg | ORAL_TABLET | Freq: Three times a day (TID) | ORAL | 0 refills | Status: DC | PRN

## 2020-06-07 MED ORDER — PREDNISONE 10 MG (21) PO TBPK: ORAL_TABLET | ORAL | 0 refills | Status: DC

## 2020-06-07 MED ORDER — ONDANSETRON 4 MG PO TBDP: 4.0000 mg | ORAL_TABLET | Freq: Four times a day (QID) | ORAL | 0 refills | Status: DC | PRN

## 2020-06-07 MED ORDER — OXYCODONE-ACETAMINOPHEN 5-325 MG PO TABS: 2.0000 | ORAL_TABLET | Freq: Four times a day (QID) | ORAL | 0 refills | Status: DC | PRN

## 2020-06-07 NOTE — ED Notes (Signed)
Patient transported to X-ray 

## 2020-06-07 NOTE — ED Provider Notes (Signed)
TIME SEEN: 5:07 AM  CHIEF COMPLAINT: Right-sided back pain  HPI: Patient is a 64 year old female with history of hypertension, COPD who presents to the emergency department with right-sided back pain that radiates down the right leg.  Symptoms ongoing for the past week and worse with movement, walking.  Was seen by her PCP and has been on NSAIDs and Flexeril without much relief.  Denies current numbness, tingling or focal weakness.  States at times she will have tingling in her fingertips and feet which is a chronic issue for her but none currently.  She denies fevers.  No epidural injections.  She has previously had a spinal stimulator but this was removed about 1 year after it was placed.  No other back surgeries.  No history of diabetes, HIV or cancer.  She denies any known injury, falls.  No bowel or bladder incontinence.  No urinary retention.  No history of IV drug abuse.  ROS: See HPI Constitutional: no fever  Eyes: no drainage  ENT: no runny nose   Cardiovascular:  no chest pain  Resp: no SOB  GI: no vomiting GU: no dysuria Integumentary: no rash  Allergy: no hives  Musculoskeletal: no leg swelling  Neurological: no slurred speech ROS otherwise negative  PAST MEDICAL HISTORY/PAST SURGICAL HISTORY:  Past Medical History:  Diagnosis Date  . Anxiety   . Arthritis   . Asthma   . COPD (chronic obstructive pulmonary disease) ([place])   . Fracture    closed displaced left radial head  . Hypertension   . Sleep apnea    does not wear CPAP    MEDICATIONS:  Prior to Admission medications   Medication Sig Start Date End Date Taking? Authorizing Provider  albuterol (PROVENTIL) (2.5 MG/3ML) 0.083% nebulizer solution Take 3 mLs (2.5 mg total) by nebulization every 6 (six) hours as needed for wheezing or shortness of breath. 04/18/20   Valentina Shaggy, MD  albuterol (VENTOLIN HFA) 108 (90 Base) MCG/ACT inhaler Inhale 2 puffs into the lungs every 6 (six) hours as needed for wheezing or  shortness of breath. 05/22/20   Olalere, Cicero Duck A, MD  amLODipine (NORVASC) 5 MG tablet Take 1 tablet (5 mg total) by mouth daily. 05/24/20 08/22/20  Minus Breeding, MD  aspirin EC 81 MG tablet Take 81 mg by mouth daily. Swallow whole.    [provider]  atorvastatin (LIPITOR) 10 MG tablet Take 10 mg by mouth daily.    [provider]  budesonide (PULMICORT) 0.5 MG/2ML nebulizer solution Take 2 mLs (0.5 mg total) by nebulization 2 (two) times daily. Patient not taking: Reported on 05/24/2020 04/18/20   Valentina Shaggy, MD  bumetanide (BUMEX) 2 MG tablet Take 2 mg by mouth daily. 03/05/20   [provider]  cyclobenzaprine (FLEXERIL) 10 MG tablet Take 10 mg by mouth 2 (two) times daily as needed for muscle spasms.  08/09/19   [provider]  Fluticasone-Umeclidin-Vilant (TRELEGY ELLIPTA) 200-62.5-25 MCG/INH AEPB Inhale 1 puff into the lungs daily. 04/30/20   Lauraine Rinne, NP  furosemide (LASIX) 40 MG tablet Take 1 tablet (40 mg total) by mouth daily as needed for fluid or edema (WEIGHT GAIN OF 2LBS, SHORTNESS OF BREATH). 05/24/20 08/22/20  Minus Breeding, MD  HYDROcodone-acetaminophen (NORCO/VICODIN) 5-325 MG tablet Take 1 tablet by mouth every 6 (six) hours as needed. 01/15/20   Drenda Freeze, MD  ipratropium-albuterol (DUONEB) 0.5-2.5 (3) MG/3ML SOLN Inhale 3 mLs into the lungs every 6 (six) hours as needed (wheezing).  06/03/20   Valentina Shaggy, MD  levocetirizine Harlow Ohms) 5 MG tablet Take 1 tablet (5 mg total) by mouth every evening. 02/07/19   Valentina Shaggy, MD  losartan-hydrochlorothiazide (HYZAAR) 100-25 MG tablet Take 1 tablet by mouth daily. 05/24/20   Minus Breeding, MD  montelukast (SINGULAIR) 10 MG tablet Take 1 tablet (10 mg total) by mouth at bedtime. 04/18/20   Valentina Shaggy, MD  Multiple Vitamin (MULTIVITAMIN WITH MINERALS) TABS tablet Take 1 tablet by mouth daily. Centrum Multivitamin    [provider]   pantoprazole (PROTONIX) 40 MG tablet Take 1 tablet (40 mg total) by mouth daily. 03/14/20   Lauraine Rinne, NP  Vitamin D, Ergocalciferol, (DRISDOL) 1.25 MG (50000 UNIT) CAPS capsule Take 50,000 Units by mouth every Monday.  10/27/19   [provider]  zolpidem (AMBIEN) 10 MG tablet Take 10 mg by mouth at bedtime as needed for sleep.    [provider]    ALLERGIES:  Allergies  Allergen Reactions  . Banana Hives and Itching    SOCIAL HISTORY:  Social History   Tobacco Use  . Smoking status: Former Smoker    Packs/day: 0.50    Years: 10.00    Pack years: 5.00    Types: Cigarettes    Quit date: 06/15/1998    Years since quitting: 21.9  . Smokeless tobacco: Never Used  Substance Use Topics  . Alcohol use: Never    FAMILY HISTORY: Family History  Problem Relation Age of Onset  . Hypertension Mother   . Stroke Mother   . Cancer Father     EXAM: BP (!) 156/130 (BP Location: Left Arm)   Pulse 99   Temp 99.2 F (37.3 C) (Oral)   Resp 18   SpO2 98%  CONSTITUTIONAL: Alert and oriented and responds appropriately to questions. Well-appearing; well-nourished, obese HEAD: Normocephalic EYES: Conjunctivae clear, pupils appear equal, EOM appear intact ENT: normal nose; moist mucous membranes NECK: Supple, normal ROM CARD: RRR; S1 and S2 appreciated; no murmurs, no clicks, no rubs, no gallops RESP: Normal chest excursion without splinting or tachypnea; breath sounds clear and equal bilaterally; no wheezes, no rhonchi, no rales, no hypoxia or respiratory distress, speaking full sentences ABD/GI: Normal bowel sounds; non-distended; soft, non-tender, no rebound, no guarding, no peritoneal signs, no hepatosplenomegaly BACK:  The back appears normal and is tender over the lower lumbar spine and right paraspinal muscles without redness, warmth, soft tissue swelling, ecchymosis.  No midline step-off or deformity.  No rash or other lesions noted.  No CVA tenderness. EXT:  Normal ROM in all joints; no deformity noted, no edema; no cyanosis SKIN: Normal color for age and race; warm; no rash on exposed skin NEURO: Moves all extremities equally, strength 5/5 in all 4 extremities, sensation to light touch intact diffusely, no clonus, no saddle anesthesia, normal speech PSYCH: The patient's mood and manner are appropriate.   MEDICAL DECISION MAKING: Patient here with back pain that radiates to the right leg.  Suspect sciatica.  She has no red flag symptoms.  She has previously had a spinal stimulator that has been removed.  X-ray of the right hip was obtained in triage and shows no acute abnormality.  Given she does have some midline tenderness and is elderly, will obtain x-ray of the lumbar spine.  Given pain medication, steroids here.  ED PROGRESS: Patient able to ambulate without assistance.  Reports feeling better.  Will discharge home with prescription of prednisone taper, Percocet.  She  has a PCP for close outpatient follow-up.  X-rays show degenerative changes but no other acute abnormality.  At this time, I do not feel there is any life-threatening condition present. I have reviewed, interpreted and discussed all results (EKG, imaging, lab, urine as appropriate) and exam findings with patient/family. I have reviewed nursing notes and appropriate previous records.  I feel the patient is safe to be discharged home without further emergent workup and can continue workup as an outpatient as needed. Discussed usual and customary return precautions. Patient/family verbalize understanding and are comfortable with this plan.  Outpatient follow-up has been provided as needed. All questions have been answered.      Maelyn Berrey was evaluated in Emergency Department on 06/07/2020 for the symptoms described in the history of present illness. She was evaluated in the context of the global COVID-19 pandemic, which necessitated consideration that the patient might be at  risk for infection with the SARS-CoV-2 virus that causes COVID-19. Institutional protocols and algorithms that pertain to the evaluation of patients at risk for COVID-19 are in a state of rapid change based on information released by regulatory bodies including the CDC and federal and state organizations. These policies and algorithms were followed during the patient's care in the ED.      Dorise Gangi, Delice Bison, DO 06/07/20 [phone]

## 2020-06-07 NOTE — Discharge Instructions (Signed)
You are being provided a prescription for opiates (also known as narcotics) for pain control.  Opiates can be addictive and should only be used when absolutely necessary for pain control when other alternatives do not work.  We recommend you only use them for the recommended amount of time and only as prescribed.  Please do not take with other sedative medications or alcohol.  Please do not drive, operate machinery, make important decisions while taking opiates.  Please note that these medications can be addictive and have high abuse potential.  Patients can become addicted to narcotics after only taking them for a few days.  Please keep these medications locked away from children, teenagers or any family members with history of substance abuse.  Narcotic pain medicine may also make you constipated.  You may use over-the-counter medications such as MiraLAX, Colace to prevent constipation.  If you become constipated you may use over-the-counter enemas as needed.  Itching and nausea are common side effects of narcotic pain medication.  If you develop uncontrolled vomiting or a rash, please stop these medications.

## 2020-06-07 NOTE — ED Notes (Signed)
Patient verbalized understanding of discharge instructions. Opportunity for questions and answers.  

## 2020-06-07 NOTE — ED Triage Notes (Addendum)
Pt presents to ED POV. Pt c/o R leg pain. Pain is stabbing x2w, pt unable to bear weight. EMS VSS

## 2020-06-07 NOTE — ED Notes (Signed)
Ambulated patient in the hall, patient ambulated independently and reported minimal pain walking.

## 2020-06-10 ENCOUNTER — Other Ambulatory Visit: Payer: Self-pay

## 2020-06-10 MED ORDER — FUROSEMIDE 40 MG PO TABS: 40.0000 mg | ORAL_TABLET | Freq: Every day | ORAL | 3 refills | Status: DC | PRN

## 2020-06-10 MED ORDER — AMLODIPINE BESYLATE 5 MG PO TABS: 5.0000 mg | ORAL_TABLET | Freq: Every day | ORAL | 3 refills | Status: DC

## 2020-06-10 NOTE — Telephone Encounter (Signed)
Sent RX to pharmacy.

## 2020-06-11 ENCOUNTER — Ambulatory Visit: Payer: Self-pay

## 2020-06-13 ENCOUNTER — Other Ambulatory Visit: Payer: Self-pay

## 2020-06-13 ENCOUNTER — Ambulatory Visit (INDEPENDENT_AMBULATORY_CARE_PROVIDER_SITE_OTHER): Payer: Medicare Other | Admitting: *Deleted

## 2020-06-13 DIAGNOSIS — J4550 Severe persistent asthma, uncomplicated: Secondary | ICD-10-CM

## 2020-06-14 ENCOUNTER — Other Ambulatory Visit: Payer: Self-pay | Admitting: Pulmonary Disease

## 2020-06-14 DIAGNOSIS — K219 Gastro-esophageal reflux disease without esophagitis: Secondary | ICD-10-CM

## 2020-06-14 DIAGNOSIS — J4550 Severe persistent asthma, uncomplicated: Secondary | ICD-10-CM

## 2020-06-21 ENCOUNTER — Ambulatory Visit: Payer: Medicare Other

## 2020-06-21 ENCOUNTER — Ambulatory Visit
Admission: RE | Admit: 2020-06-21 | Discharge: 2020-06-21 | Disposition: A | Discharge status: Home / Self Care | Payer: Medicare Other | Source: Ambulatory Visit | Attending: Family Medicine | Admitting: Family Medicine

## 2020-06-21 ENCOUNTER — Other Ambulatory Visit: Payer: Self-pay

## 2020-06-21 DIAGNOSIS — Z1231 Encounter for screening mammogram for malignant neoplasm of breast: Secondary | ICD-10-CM

## 2020-06-24 ENCOUNTER — Telehealth: Payer: Self-pay | Admitting: Allergy & Immunology

## 2020-06-24 MED ORDER — ALBUTEROL SULFATE (2.5 MG/3ML) 0.083% IN NEBU: 2.5000 mg | INHALATION_SOLUTION | Freq: Four times a day (QID) | RESPIRATORY_TRACT | 1 refills | Status: DC | PRN

## 2020-06-24 NOTE — Telephone Encounter (Signed)
Medication sent to pharmacy and patient notified.

## 2020-06-24 NOTE — Telephone Encounter (Signed)
Patient is requesting liquid nebulizer solution. [facility].

## 2020-06-26 ENCOUNTER — Other Ambulatory Visit: Payer: Self-pay

## 2020-06-26 MED ORDER — AMLODIPINE BESYLATE 5 MG PO TABS: 5.0000 mg | ORAL_TABLET | Freq: Every day | ORAL | 3 refills | Status: DC

## 2020-06-26 NOTE — Telephone Encounter (Signed)
Sounds good - thank you!   Salvatore Marvel, MD [facility]

## 2020-06-27 LAB — BASIC METABOLIC PANEL
BUN/Creatinine Ratio: 20 (ref 12–28)
BUN: 16 mg/dL (ref 8–27)
CO2: 23 mmol/L (ref 20–29)
Calcium: 9.4 mg/dL (ref 8.7–10.3)
Chloride: 102 mmol/L (ref 96–106)
Creatinine, Ser: 0.79 mg/dL (ref 0.57–1.00)
GFR calc Af Amer: 90 mL/min/{1.73_m2} (ref >59)
GFR calc non Af Amer: 78 mL/min/{1.73_m2} (ref >59)
Glucose: 98 mg/dL (ref 65–99)
Potassium: 4.2 mmol/L (ref 3.5–5.2)
Sodium: 142 mmol/L (ref 134–144)

## 2020-07-02 ENCOUNTER — Other Ambulatory Visit: Payer: Self-pay | Admitting: *Deleted

## 2020-07-02 MED ORDER — IPRATROPIUM-ALBUTEROL 0.5-2.5 (3) MG/3ML IN SOLN: 3.0000 mL | Freq: Four times a day (QID) | RESPIRATORY_TRACT | 1 refills | Status: DC | PRN

## 2020-07-18 ENCOUNTER — Ambulatory Visit: Payer: Medicare Other | Admitting: Allergy & Immunology

## 2020-07-23 ENCOUNTER — Ambulatory Visit (INDEPENDENT_AMBULATORY_CARE_PROVIDER_SITE_OTHER): Payer: Medicare Other | Admitting: Allergy & Immunology

## 2020-07-23 ENCOUNTER — Encounter: Payer: Self-pay | Admitting: Allergy & Immunology

## 2020-07-23 ENCOUNTER — Other Ambulatory Visit: Payer: Self-pay

## 2020-07-23 VITALS — BP 138/80 | HR 85 | Temp 97.2°F | Ht 70.0 in | Wt 298.4 lb

## 2020-07-23 DIAGNOSIS — R059 Cough, unspecified: Secondary | ICD-10-CM

## 2020-07-23 DIAGNOSIS — B999 Unspecified infectious disease: Secondary | ICD-10-CM

## 2020-07-23 DIAGNOSIS — J3089 Other allergic rhinitis: Secondary | ICD-10-CM | POA: Diagnosis not present

## 2020-07-23 DIAGNOSIS — J4550 Severe persistent asthma, uncomplicated: Secondary | ICD-10-CM | POA: Diagnosis not present

## 2020-07-23 DIAGNOSIS — K219 Gastro-esophageal reflux disease without esophagitis: Secondary | ICD-10-CM

## 2020-07-23 MED ORDER — MONTELUKAST SODIUM 10 MG PO TABS: 10.0000 mg | ORAL_TABLET | Freq: Every day | ORAL | 2 refills | Status: DC

## 2020-07-23 MED ORDER — FORMOTEROL FUMARATE 20 MCG/2ML IN NEBU: 20.0000 ug | INHALATION_SOLUTION | Freq: Two times a day (BID) | RESPIRATORY_TRACT | 1 refills | Status: DC

## 2020-07-23 MED ORDER — TRELEGY ELLIPTA 200-62.5-25 MCG/INH IN AEPB: 1.0000 | INHALATION_SPRAY | Freq: Every day | RESPIRATORY_TRACT | 5 refills | Status: DC

## 2020-07-23 MED ORDER — LEVOCETIRIZINE DIHYDROCHLORIDE 5 MG PO TABS: 5.0000 mg | ORAL_TABLET | Freq: Every evening | ORAL | 1 refills | Status: DC

## 2020-07-23 MED ORDER — BUDESONIDE 0.5 MG/2ML IN SUSP: 0.5000 mg | Freq: Two times a day (BID) | RESPIRATORY_TRACT | 1 refills | Status: DC

## 2020-07-23 NOTE — Patient Instructions (Addendum)
1. Severe persistent asthma, uncomplicated - Lung testing looked BETTER today!   - We are changing you to an all nebulized medications: Pulmicort twice daily and Perforomist twice daily (we will send to [facility] instead).  - We are sending that [facility] to see if they can run it correctly.  - Daily controller medication(s): Pulmicort twice daily and Perforomist twice daily + Singulair 80m daily + Fasenra every 8 weeks - Prior to physical activity: albuterol 2 puffs 10-15 minutes before physical activity. - Rescue medications: albuterol 4 puffs every 4-6 hours as needed, albuterol nebulizer one vial every 4-6 hours as needed or DuoNeb nebulizer one vial every 4-6 hours as needed - Asthma control goals:  * Full participation in all desired activities (may need albuterol before activity) * Albuterol use two time or less a week on average (not counting use with activity) * Cough interfering with sleep two time or less a month * Oral steroids no more than once a year * No hospitalizations  2. Chronic rhinitis (dust mites, molds) - Continue with Flonase one spray per nostril twice daily to  - Continue with Xyzal (levocetirizine) 523mdaily to control any postnasal drip.   3. Recurrent infections - We are going to get some repeat labs to see how you responded to your pneumonia shot.   4. Gastroesophageal reflux disease  - Take the with Protonix 4075mnce daily to control any silent reflux.  5. Return in about 3 months (around 10/20/2020).    Please inform us [place] any Emergency Department visits, hospitalizations, or changes in symptoms. Call us [place] going to the ED for breathing or allergy symptoms since we might be able to fit you in for a sick visit. Feel free to contact us [place] with any questions, problems, or concerns.  It was a pleasure to see you again today!  Websites that have reliable patient information: 1. American Academy of Asthma, Allergy, and Immunology: www.aaaai.org 2. Food  Allergy Research and Education (FARE): foodallergy.org 3. Mothers of Asthmatics: http://www.asthmacommunitynetwork.org 4. American College of Allergy, Asthma, and Immunology: www.acaai.org   COVID-19 Vaccine Information can be found at: [email or web address] questions related to vaccine distribution or appointments, please email [email or web address] or call [phone]   "Like" us [place] Facebook and Instagram for our latest updates!        Make sure you are registered to vote! If you have moved or change d any of your contact information, you will need to get this updated before voting!  In some cases, you MAY be able to register to vote online: [email or web address]

## 2020-07-23 NOTE — Addendum Note (Signed)
Addended by: Larence Penning on: 07/23/2020 06:28 PM   Modules accepted: Orders

## 2020-07-23 NOTE — Progress Notes (Signed)
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 Severe persistent asthma, uncomplicated - Lung testing looked BETTER today!   - We are changing you to an all nebulized medications: Pulmicort twice daily and Perforomist twice daily (we will send to [facility] instead).  - We are sending that [facility] to see if they can run it correctly.  - Daily controller medication(s): Pulmicort twice daily and Perforomist twice daily + Singulair 12m daily + Fasenra every 8 weeks - Prior to physical activity: albuterol 2 puffs 10-15 minutes before physical activity. - Rescue medications: albuterol 4 puffs every 4-6 hours as needed, albuterol nebulizer one vial every 4-6 hours as needed or DuoNeb nebulizer one vial every 4-6 hours as needed - Asthma control goals:  * Full participation in all desired activities (may need albuterol before activity) * Albuterol use two time or less a week on average (not counting use with activity) * Cough interfering with sleep two time or less a month * Oral steroids no more than once a year * No hospitalizations  2. Chronic rhinitis (dust mites, molds) - Continue with Flonase one spray per nostril twice daily to  - Continue with Xyzal (levocetirizine) 527mdaily to control any postnasal drip.   3. Recurrent infections - We are going to get some repeat labs to see how you responded to your pneumonia shot.   4. Gastroesophageal reflux disease  - Take the with Protonix 4094mnce daily to control any silent reflux.  5. Return in about 3 months (around 10/20/2020).   Subjective:   [name] a 64 78o. female presenting today for follow up of  Chief Complaint  Patient presents  with  . Asthma    Has kept her up at night SOB and coughing  When she goes out shopping has issue catching her breath causes frequent use of rescue inhaler     [name] a history of the following: Patient Active Problem List   Diagnosis Date Noted  . Medication management 04/30/2020  . Gastroesophageal reflux disease 03/14/2020  . OSA (obstructive sleep apnea) 02/15/2020  . Abnormal findings on diagnostic imaging of lung 01/08/2020  . Educated about COVID-19 virus infection 11/30/2019  . Aortic atherosclerosis ([place] [facility] [place]6/17/2021  . Chest pain 11/26/2019  . Acute asthma exacerbation 05/22/2019  . Left radial head fracture 02/03/2019  . Closed fracture of radial head 01/29/2019  . Prediabetes 10/19/2018  . 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) ([facility]CC[place]5/11/2018  . Insomnia 10/19/2018  . Elbow pain, chronic, left 10/19/2018  . Asthma 05/29/2018  . COPD exacerbation (HC[facility]0/22/2019  . Dyspnea 10/12/2017  . Asthma attack 07/11/2017  . Pancolitis ([facility]CC[place]2/11/2016  . H/O Spinal surgery 01/18/2015  . Essential hypertension 01/18/2015  . Obesity 01/18/2015    History obtained from: chart review and patient.  [name] a 64 60o. female presenting for a follow up visit.  She was last seen in November 2021.  At that time, her lung testing looks slightly better.  We stopped her Trelegy and changed her to you all nebulized medicines.  For her rhinitis, would continue with Flonase as well as Xyzal.  Her Protonix was controlling her GERD.  Since the last visit, she has been coughing. She does  have a raspy sore throat for thre past three days. She is not using anything in particular for it. Her SOB continues to be a problem.   Asthma/Respiratory Symptom History: Insurance would not cover the nebulized medications. She is therefore now on albuterol and DuoNeb nebulizers. She is using the Trelegy every day and the [place]. She has been very compliant with the  Brenda. She has been out of the hospital and feels that this is going well. She is using her albuterol 2-3 times per day every day.  This seems to be a combination of both a need as well as a habit. She is trying to need prednisone as much and she feels that the Berna Bue has helped with that.  She does have awakenings at night due to wheezing once weekly. She does a treatment and lays back down. This happens 1-2 times per week.  Allergic Rhinitis Symptom History: She was on Augmentin twice daily around one month ago. This did work well. She does endorse some chest congestion now. She is not on allergy shots. She is using her allergy medications as prescribed.   She has Protonix. But she is not using it every day. She did not realize that this would make her breathing any better, but she is open to doing that.   Otherwise, there have been no changes to her past medical history, surgical history, family history, or social history.    Review of Systems  Constitutional: Negative.  Negative for chills, fever, malaise/fatigue and weight loss.  HENT: Positive for congestion and sinus pain. Negative for ear discharge and ear pain.   Eyes: Negative for pain, discharge and redness.  Respiratory: Positive for cough and sputum production. Negative for shortness of breath and wheezing.   Cardiovascular: Negative.  Negative for chest pain and palpitations.  Gastrointestinal: Negative for abdominal pain, constipation, diarrhea, heartburn, nausea and vomiting.  Skin: Negative.  Negative for itching and rash.  Neurological: Negative for dizziness and headaches.  Endo/Heme/Allergies: Positive for environmental allergies. Does not bruise/bleed easily.       Objective:   Blood pressure 138/80, pulse 85, temperature (!) 97.2 F (36.2 C), height 5' 10"  (1.778 m), weight 298 lb 6.4 oz (135.4 kg), SpO2 95 %. Body mass index is 42.82 kg/m.   Physical Exam:  Physical Exam Constitutional:      Appearance:  She is well-developed.     Comments: Pleasant female.  Very talkative as always.  HENT:     Head: Normocephalic and atraumatic.     Right Ear: Tympanic membrane, ear canal and external ear normal.     Left Ear: Tympanic membrane, ear canal and external ear normal.     Nose: No nasal deformity, septal deviation, mucosal edema, rhinorrhea or epistaxis.     Right Turbinates: Enlarged and swollen.     Left Turbinates: Enlarged and swollen.     Right Sinus: No maxillary sinus tenderness or frontal sinus tenderness.     Left Sinus: No maxillary sinus tenderness or frontal sinus tenderness.     Comments: Dried rhinorrhea bilaterally.     Mouth/Throat:     Mouth: Oropharynx is clear and moist. Mucous membranes are not pale and not dry.     Pharynx: Uvula midline.     Comments: Cobblestoning present.  Eyes:     General:        Right eye: No discharge.        Left eye: No discharge.     Extraocular Movements: EOM  normal.     Conjunctiva/sclera: Conjunctivae normal.     Right eye: Right conjunctiva is not injected. No chemosis.    Left eye: Left conjunctiva is not injected. No chemosis.    Pupils: Pupils are equal, round, and reactive to light.  Cardiovascular:     Rate and Rhythm: Normal rate and regular rhythm.     Heart sounds: Normal heart sounds.  Pulmonary:     Effort: Pulmonary effort is normal. No tachypnea, accessory muscle usage or respiratory distress.     Breath sounds: Normal breath sounds. No wheezing, rhonchi or rales.     Comments: Coarse upper airway sounds throughout.  Decreased air movement at the bases.  Some isolated wheezes. Chest:     Chest wall: No tenderness.  Lymphadenopathy:     Cervical: No cervical adenopathy.  Skin:    Coloration: Skin is not pale.     Findings: No abrasion, erythema, petechiae or rash. Rash is not papular, urticarial or vesicular.  Neurological:     Mental Status: She is alert.  Psychiatric:        Mood and Affect: Mood and affect normal.       Diagnostic studies:    Spirometry: results abnormal (FEV1: 1.50/60%, FVC: 1.99/62%, FEV1/FVC: 75%).    Spirometry consistent with normal pattern.   Allergy Studies: none       Salvatore Marvel, MD  [facility] [place]

## 2020-07-24 ENCOUNTER — Telehealth: Payer: Self-pay

## 2020-07-24 ENCOUNTER — Ambulatory Visit
Admission: RE | Admit: 2020-07-24 | Discharge: 2020-07-24 | Disposition: A | Discharge status: Home / Self Care | Payer: Medicare Other | Source: Ambulatory Visit | Attending: Allergy & Immunology | Admitting: Allergy & Immunology

## 2020-07-24 DIAGNOSIS — R059 Cough, unspecified: Secondary | ICD-10-CM

## 2020-07-24 NOTE — Telephone Encounter (Signed)
Tiffany from [facility] called asking that Dr. Gillermina Hu notes be faxed over to her so that patient can get her medications through [facility] that were written per Dr. Ernst Bowler. Papers have been faxed over to [place] from [facility].

## 2020-07-24 NOTE — Addendum Note (Signed)
Addended by: Clovis Cao A on: 07/24/2020 04:22 PM   Modules accepted: Orders

## 2020-07-29 DIAGNOSIS — M48061 Spinal stenosis, lumbar region without neurogenic claudication: Secondary | ICD-10-CM | POA: Insufficient documentation

## 2020-07-30 ENCOUNTER — Other Ambulatory Visit: Payer: Self-pay

## 2020-07-30 ENCOUNTER — Other Ambulatory Visit: Payer: Self-pay | Admitting: Internal Medicine

## 2020-07-30 DIAGNOSIS — M545 Low back pain, unspecified: Secondary | ICD-10-CM

## 2020-07-30 NOTE — Progress Notes (Signed)
Opened to refill medications but medications are in date. No need for encounter.

## 2020-07-31 LAB — STREP PNEUMONIAE 23 SEROTYPES IGG
Pneumo Ab Type 1*: 1.8 ug/mL (ref >1.3)
Pneumo Ab Type 12 (12F)*: 0.4 ug/mL — ABNORMAL LOW (ref >1.3)
Pneumo Ab Type 14*: 1.1 ug/mL — ABNORMAL LOW (ref >1.3)
Pneumo Ab Type 17 (17F)*: 2.2 ug/mL (ref >1.3)
Pneumo Ab Type 19 (19F)*: 1.6 ug/mL (ref >1.3)
Pneumo Ab Type 2*: 1.4 ug/mL (ref >1.3)
Pneumo Ab Type 20*: 3.4 ug/mL (ref >1.3)
Pneumo Ab Type 22 (22F)*: 0.5 ug/mL — ABNORMAL LOW (ref >1.3)
Pneumo Ab Type 23 (23F)*: 0.3 ug/mL — ABNORMAL LOW (ref >1.3)
Pneumo Ab Type 26 (6B)*: 1.6 ug/mL (ref >1.3)
Pneumo Ab Type 3*: 2.7 ug/mL (ref >1.3)
Pneumo Ab Type 34 (10A)*: 1.6 ug/mL (ref >1.3)
Pneumo Ab Type 4*: 4.7 ug/mL (ref >1.3)
Pneumo Ab Type 43 (11A)*: 1.1 ug/mL — ABNORMAL LOW (ref >1.3)
Pneumo Ab Type 5*: 8.7 ug/mL (ref >1.3)
Pneumo Ab Type 51 (7F)*: 0.9 ug/mL — ABNORMAL LOW (ref >1.3)
Pneumo Ab Type 54 (15B)*: 0.2 ug/mL — ABNORMAL LOW (ref >1.3)
Pneumo Ab Type 56 (18C)*: 0.2 ug/mL — ABNORMAL LOW (ref >1.3)
Pneumo Ab Type 57 (19A)*: 2.4 ug/mL (ref >1.3)
Pneumo Ab Type 68 (9V)*: 0.9 ug/mL — ABNORMAL LOW (ref >1.3)
Pneumo Ab Type 70 (33F)*: 1.1 ug/mL — ABNORMAL LOW (ref >1.3)
Pneumo Ab Type 8*: 2.2 ug/mL (ref >1.3)
Pneumo Ab Type 9 (9N)*: 0.6 ug/mL — ABNORMAL LOW (ref >1.3)

## 2020-08-05 ENCOUNTER — Other Ambulatory Visit: Payer: Self-pay | Admitting: Allergy & Immunology

## 2020-08-06 ENCOUNTER — Other Ambulatory Visit: Payer: Self-pay | Admitting: Allergy

## 2020-08-08 ENCOUNTER — Ambulatory Visit (INDEPENDENT_AMBULATORY_CARE_PROVIDER_SITE_OTHER): Payer: Medicare Other

## 2020-08-08 ENCOUNTER — Ambulatory Visit: Payer: Medicare Other | Admitting: Surgery

## 2020-08-08 ENCOUNTER — Other Ambulatory Visit: Payer: Self-pay

## 2020-08-08 DIAGNOSIS — J4550 Severe persistent asthma, uncomplicated: Secondary | ICD-10-CM

## 2020-09-04 ENCOUNTER — Inpatient Hospital Stay: Admission: RE | Admit: 2020-09-04 | Payer: Medicare Other | Source: Ambulatory Visit

## 2020-09-11 ENCOUNTER — Ambulatory Visit: Payer: Self-pay

## 2020-09-11 ENCOUNTER — Ambulatory Visit (INDEPENDENT_AMBULATORY_CARE_PROVIDER_SITE_OTHER): Payer: Medicare Other | Admitting: Orthopaedic Surgery

## 2020-09-11 ENCOUNTER — Encounter: Payer: Self-pay | Admitting: Orthopaedic Surgery

## 2020-09-11 ENCOUNTER — Other Ambulatory Visit: Payer: Self-pay

## 2020-09-11 VITALS — BP 138/81 | HR 105 | Ht 70.0 in | Wt 298.0 lb

## 2020-09-11 DIAGNOSIS — M25522 Pain in left elbow: Secondary | ICD-10-CM

## 2020-09-11 DIAGNOSIS — M25532 Pain in left wrist: Secondary | ICD-10-CM | POA: Diagnosis not present

## 2020-09-11 NOTE — Progress Notes (Signed)
Office Visit Note   Patient: Patty Howard           Date of Birth: 09/30/1955           MRN: [id number] Visit Date: 09/11/2020              Requested by: Buzzy Han, MD [place],  [place] [address] PCP: Buzzy Han, MD   Assessment & Plan: Visit Diagnoses:  1. Pain in left wrist   2. Pain in left elbow     Plan: Return as needed.  We discussed avoiding heavy activities with her arm.  She is happy with the results of surgery and has better motion and less pain.  Follow-Up Instructions: No follow-ups on file.   Orders:  Orders Placed This Encounter  Procedures  . XR Elbow 2 Views Left  . XR Wrist Complete Left   No orders of the defined types were placed in this encounter.     Procedures: No procedures performed   Clinical Data: No additional findings.   Subjective: Chief Complaint  Patient presents with  . Left Elbow - Pain    HPI six 65-year-old female post radial head arthroplasty on the left 02/03/2019 after over 1 year severe comminuted radial head fracture with surgery delays due to other problems.  She has been scheduled for surgery by 2 other physicians in [place].  Patient noticed occasional clicking in the elbow with flexion extension.  Sometimes she has noticed pain in her wrist mild swelling her fingers when she wakes up if she has been sleeping on her left arm.  Some numbness and tingling long and ring finger.  She denies associated neck pain.  Review of Systems updated unchanged.   Objective: Vital Signs: BP 138/81   Pulse (!) 105   Ht 5' 10"  (1.778 m)   Wt 298 lb (135.2 kg)   BMI 42.76 kg/m   Physical Exam Constitutional:      Appearance: She is well-developed.  HENT:     Head: Normocephalic.     Right Ear: External ear normal.     Left Ear: External ear normal.  Eyes:     Pupils: Pupils are equal, round, and reactive to light.  Neck:     Thyroid: No thyromegaly.     Trachea: No  tracheal deviation.  Cardiovascular:     Rate and Rhythm: Normal rate.  Pulmonary:     Effort: Pulmonary effort is normal.  Abdominal:     Palpations: Abdomen is soft.  Skin:    General: Skin is warm and dry.  Neurological:     Mental Status: She is alert and oriented to person, place, and time.  Psychiatric:        Behavior: Behavior normal.     Ortho Exam well-healed radial head arthroplasty incision lateral elbow no elbow effusion.  She lacks 10 degrees full extension.  Occasion with flexion extension mild click is noted.  Slight soreness over the dorsal wrist capsule and radial scaphoid joint.  Some tenderness at the base of the thumb negative Wynn Maudlin.  No thenar atrophy. Specialty Comments:  No specialty comments available.  Imaging: No results found.   PMFS History: Patient Active Problem List   Diagnosis Date Noted  . Medication management 04/30/2020  . Gastroesophageal reflux disease 03/14/2020  . OSA (obstructive sleep apnea) 02/15/2020  . Abnormal findings on diagnostic imaging of lung 01/08/2020  . Educated about COVID-19 virus infection 11/30/2019  . Aortic atherosclerosis ([place]) 11/30/2019  .  Chest pain 11/26/2019  . Acute asthma exacerbation 05/22/2019  . Left radial head fracture 02/03/2019  . Closed fracture of radial head 01/29/2019  . Prediabetes 10/19/2018  . 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 Insomnia 10/19/2018  . Elbow pain, chronic, left 10/19/2018  . Asthma 05/29/2018  . COPD exacerbation ([place]) 04/05/2018  . Dyspnea 10/12/2017  . Asthma attack 07/11/2017  . Pancolitis ([place]) 05/20/2017  . H/O Spinal surgery 01/18/2015  . Essential hypertension 01/18/2015  . Obesity 01/18/2015   Past Medical History:  Diagnosis Date  . Anxiety   . Arthritis   . Asthma   . COPD (chronic obstructive pulmonary disease) ([place])   . Fracture    closed displaced left radial head  . Hypertension   . Sleep apnea     does not wear CPAP    Family History  Problem Relation Age of Onset  . Hypertension Mother   . Stroke Mother   . Cancer Father     Past Surgical History:  Procedure Laterality Date  . BACK SURGERY     spinal stimulator  . BREAST BIOPSY Right 2017   benign  . COLONOSCOPY W/ BIOPSIES AND POLYPECTOMY    . DILATION AND CURETTAGE OF UTERUS    . LAPAROSCOPIC ROUX-EN-Y GASTRIC BYPASS WITH UPPER ENDOSCOPY AND REMOVAL OF LAP BAND    . RADIAL HEAD ARTHROPLASTY Left 02/03/2019   Procedure: LEFT RADIAL HEAD ARTHROPLASTY;  Surgeon: [name], MD;  Location: [facility];  Service: Orthopedics;  Laterality: Left;  AXILLARY BLOCK VS BIER BLOCK  . TUBAL LIGATION     Social History   Occupational History  . Not on file  Tobacco Use  . Smoking status: Former Smoker    Packs/day: 0.50    Years: 10.00    Pack years: 5.00    Types: Cigarettes    Quit date: 06/15/1998    Years since quitting: 22.2  . Smokeless tobacco: Never Used  Vaping Use  . Vaping Use: Never used  Substance and Sexual Activity  . Alcohol use: Never  . Drug use: Never  . Sexual activity: Not on file

## 2020-09-21 ENCOUNTER — Inpatient Hospital Stay: Admission: RE | Admit: 2020-09-21 | Payer: Medicare Other | Source: Ambulatory Visit

## 2020-09-22 ENCOUNTER — Other Ambulatory Visit: Payer: Medicare Other

## 2020-09-23 ENCOUNTER — Other Ambulatory Visit: Payer: Self-pay | Admitting: Cardiology

## 2020-09-24 ENCOUNTER — Other Ambulatory Visit: Payer: Self-pay | Admitting: Allergy & Immunology

## 2020-09-26 ENCOUNTER — Telehealth: Payer: Self-pay | Admitting: Allergy & Immunology

## 2020-09-26 ENCOUNTER — Other Ambulatory Visit: Payer: Self-pay | Admitting: *Deleted

## 2020-09-26 ENCOUNTER — Other Ambulatory Visit: Payer: Medicare Other

## 2020-09-26 ENCOUNTER — Other Ambulatory Visit: Payer: Self-pay

## 2020-09-26 NOTE — Telephone Encounter (Addendum)
Pharmacy called and states albuterol was called in with a quantity of 30 which is only a 7 day supply. Pharmacy would like a new script sent in with a quantity of 120 which would be a 30 day supply.  Please advise.

## 2020-09-26 NOTE — Telephone Encounter (Signed)
Called and left a voicemail asking for the patient to return call to discuss. There are 2 different prescriptions for Albuterol sent to 2 different pharmacies. Need to speak with patient first to determine what exactly is going on and which Albuterol is being referred to.

## 2020-09-30 ENCOUNTER — Emergency Department (HOSPITAL_COMMUNITY)
Admission: EM | Admit: 2020-09-30 | Discharge: 2020-10-01 | Disposition: A | Discharge status: Home / Self Care | Payer: Medicare Other | Attending: Emergency Medicine | Admitting: Emergency Medicine

## 2020-09-30 ENCOUNTER — Encounter (HOSPITAL_COMMUNITY): Payer: Self-pay

## 2020-09-30 ENCOUNTER — Other Ambulatory Visit: Payer: Self-pay

## 2020-09-30 ENCOUNTER — Emergency Department (HOSPITAL_COMMUNITY): Payer: Medicare Other

## 2020-09-30 DIAGNOSIS — J40 Bronchitis, not specified as acute or chronic: Secondary | ICD-10-CM

## 2020-09-30 DIAGNOSIS — I10 Essential (primary) hypertension: Secondary | ICD-10-CM | POA: Diagnosis not present

## 2020-09-30 DIAGNOSIS — R059 Cough, unspecified: Secondary | ICD-10-CM | POA: Insufficient documentation

## 2020-09-30 DIAGNOSIS — Z7982 Long term (current) use of aspirin: Secondary | ICD-10-CM | POA: Diagnosis not present

## 2020-09-30 DIAGNOSIS — Z7951 Long term (current) use of inhaled steroids: Secondary | ICD-10-CM | POA: Diagnosis not present

## 2020-09-30 DIAGNOSIS — M549 Dorsalgia, unspecified: Secondary | ICD-10-CM | POA: Diagnosis not present

## 2020-09-30 DIAGNOSIS — R509 Fever, unspecified: Secondary | ICD-10-CM | POA: Insufficient documentation

## 2020-09-30 DIAGNOSIS — J441 Chronic obstructive pulmonary disease with (acute) exacerbation: Secondary | ICD-10-CM | POA: Insufficient documentation

## 2020-09-30 DIAGNOSIS — R000 Tachycardia, unspecified: Secondary | ICD-10-CM | POA: Diagnosis not present

## 2020-09-30 DIAGNOSIS — Z20822 Contact with and (suspected) exposure to covid-19: Secondary | ICD-10-CM | POA: Diagnosis not present

## 2020-09-30 DIAGNOSIS — J45901 Unspecified asthma with (acute) exacerbation: Secondary | ICD-10-CM | POA: Insufficient documentation

## 2020-09-30 DIAGNOSIS — J101 Influenza due to other identified influenza virus with other respiratory manifestations: Secondary | ICD-10-CM

## 2020-09-30 DIAGNOSIS — Z79899 Other long term (current) drug therapy: Secondary | ICD-10-CM | POA: Insufficient documentation

## 2020-09-30 DIAGNOSIS — Z87891 Personal history of nicotine dependence: Secondary | ICD-10-CM | POA: Diagnosis not present

## 2020-09-30 LAB — CBC WITH DIFFERENTIAL/PLATELET
Abs Immature Granulocytes: 0.02 10*3/uL (ref 0.00–0.07)
Basophils Absolute: 0 10*3/uL (ref 0.0–0.1)
Basophils Relative: 0 %
Eosinophils Absolute: 0 10*3/uL (ref 0.0–0.5)
Eosinophils Relative: 0 %
HCT: 31.7 % — ABNORMAL LOW (ref 36.0–46.0)
Hemoglobin: 10.2 g/dL — ABNORMAL LOW (ref 12.0–15.0)
Immature Granulocytes: 0 %
Lymphocytes Relative: 12 %
Lymphs Abs: 0.8 10*3/uL (ref 0.7–4.0)
MCH: 26.2 pg (ref 26.0–34.0)
MCHC: 32.2 g/dL (ref 30.0–36.0)
MCV: 81.5 fL (ref 80.0–100.0)
Monocytes Absolute: 0.5 10*3/uL (ref 0.1–1.0)
Monocytes Relative: 8 %
Neutro Abs: 5.5 10*3/uL (ref 1.7–7.7)
Neutrophils Relative %: 80 %
Platelets: 275 10*3/uL (ref 150–400)
RBC: 3.89 MIL/uL (ref 3.87–5.11)
RDW: 15.7 % — ABNORMAL HIGH (ref 11.5–15.5)
WBC: 6.9 10*3/uL (ref 4.0–10.5)
nRBC: 0 % (ref 0.0–0.2)

## 2020-09-30 LAB — RESP PANEL BY RT-PCR (FLU A&B, COVID) ARPGX2
Influenza A by PCR: POSITIVE — AB
Influenza B by PCR: NEGATIVE
SARS Coronavirus 2 by RT PCR: NEGATIVE

## 2020-09-30 LAB — APTT: aPTT: 31 seconds (ref 24–36)

## 2020-09-30 LAB — PROTIME-INR
INR: 1 (ref 0.8–1.2)
Prothrombin Time: 13.2 seconds (ref 11.4–15.2)

## 2020-09-30 MED ORDER — LACTATED RINGERS IV BOLUS (SEPSIS)
1000.0000 mL | Freq: Once | INTRAVENOUS | Status: AC
  Administered 2020-10-01: 1000 mL via INTRAVENOUS

## 2020-09-30 MED ORDER — ACETAMINOPHEN 325 MG PO TABS
650.0000 mg | ORAL_TABLET | Freq: Once | ORAL | Status: AC
  Administered 2020-09-30: 650 mg via ORAL
  Filled 2020-09-30: qty 2

## 2020-09-30 NOTE — Patient Instructions (Incomplete)
1. Severe persistent asthma, uncomplicated - Daily controller medication(s): Pulmicort 0.5 mg twice daily via nebulizer and Perforomist twice daily via lnebulizer + Singulair 72m daily + Fasenra every 8 weeks - Prior to physical activity: albuterol 2 puffs 10-15 minutes before physical activity. - Rescue medications: albuterol 4 puffs every 4-6 hours as needed, albuterol nebulizer one vial every 4-6 hours as needed or DuoNeb nebulizer one vial every 4-6 hours as needed - Asthma control goals:  * Full participation in all desired activities (may need albuterol before activity) * Albuterol use two time or less a week on average (not counting use with activity) * Cough interfering with sleep two time or less a month * Oral steroids no more than once a year * No hospitalizations  2. Chronic rhinitis (dust mites, molds) - Continue with Flonase one spray per nostril twice daily to  - Continue with Xyzal (levocetirizine) 590mdaily to control any postnasal drip.   3. Recurrent infections - Continue to keep track of the number of infections and antibiotics    4. Gastroesophageal reflux disease  - Take the with Protonix 4093mnce daily to control any silent reflux.  Please let us [place] if this treatment plan is not working well for you. Schedule a follow up appointment in

## 2020-09-30 NOTE — ED Triage Notes (Signed)
Pt reports fever, productive cough, and lower back pain radiating to legs since Saturday.

## 2020-09-30 NOTE — ED Provider Notes (Signed)
[facility] DEPT Provider Note   CSN: [id number] Arrival date & time: 09/30/20  2111     History Chief Complaint  Patient presents with  . Cough    Patty Howard is a 65 y.o. female.  HPI   Patient presented to the ED for evaluation of fever cough and pain in her back.  Patient states symptoms started on Saturday.  She began coughing that was productive of mucus.  That has persisted throughout the weekend until today.  Patient's had fevers of 103.  She also started having some pain in her back rating down to her legs after the coughing.  She denies any dysuria.  She denies any abdominal pain.  Patient has been vaccinated for COVID  Past Medical History:  Diagnosis Date  . Anxiety   . Arthritis   . Asthma   . COPD (chronic obstructive pulmonary disease) ([facility])   . Fracture    closed displaced left radial head  . Hypertension   . Sleep apnea    does not wear CPAP    Patient Active Problem List   Diagnosis Date Noted  . Medication management 04/30/2020  . Gastroesophageal reflux disease 03/14/2020  . OSA (obstructive sleep apnea) 02/15/2020  . Abnormal findings on diagnostic imaging of lung 01/08/2020  . Educated about COVID-19 virus infection 11/30/2019  . Aortic atherosclerosis ([place]) 11/30/2019  . Chest pain 11/26/2019  . Acute asthma exacerbation 05/22/2019  . Left radial head fracture 02/03/2019  . Closed fracture of radial head 01/29/2019  . Prediabetes 10/19/2018  . 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) ([place]) 10/19/2018  . Insomnia 10/19/2018  . Elbow pain, chronic, left 10/19/2018  . Asthma 05/29/2018  . COPD exacerbation ([place]) 04/05/2018  . Dyspnea 10/12/2017  . Asthma attack 07/11/2017  . Pancolitis ([facility]) 05/20/2017  . H/O Spinal surgery 01/18/2015  . Essential hypertension 01/18/2015  . Obesity 01/18/2015    Past Surgical History:  Procedure Laterality Date  . BACK SURGERY     spinal stimulator   . BREAST BIOPSY Right 2017   benign  . COLONOSCOPY W/ BIOPSIES AND POLYPECTOMY    . DILATION AND CURETTAGE OF UTERUS    . LAPAROSCOPIC ROUX-EN-Y GASTRIC BYPASS WITH UPPER ENDOSCOPY AND REMOVAL OF LAP BAND    . RADIAL HEAD ARTHROPLASTY Left 02/03/2019   Procedure: LEFT RADIAL HEAD ARTHROPLASTY;  Surgeon: [name], MD;  Location: [facility];  Service: Orthopedics;  Laterality: Left;  AXILLARY BLOCK VS BIER BLOCK  . TUBAL LIGATION       OB History   No obstetric history on file.     Family History  Problem Relation Age of Onset  . Hypertension Mother   . Stroke Mother   . Cancer Father     Social History   Tobacco Use  . Smoking status: Former Smoker    Packs/day: 0.50    Years: 10.00    Pack years: 5.00    Types: Cigarettes    Quit date: 06/15/1998    Years since quitting: 22.3  . Smokeless tobacco: Never Used  Vaping Use  . Vaping Use: Never used  Substance Use Topics  . Alcohol use: Never  . Drug use: Never    Home Medications Prior to Admission medications   Medication Sig Start Date End Date Taking? Authorizing Provider  albuterol (PROVENTIL) (2.5 MG/3ML) 0.083% nebulizer solution Take 3 mLs (2.5 mg total) by nebulization every 6 (six) hours as needed for wheezing or shortness of breath. 06/24/20  Valentina Shaggy, MD  albuterol (VENTOLIN HFA) 108 (90 Base) MCG/ACT inhaler INHALE ONE (1) PUFF TO TWO (2) PUFFS BY MOUTH AND INTO THE LUNGS EVERY 6 HOURS AS NEEDED FOR SHORTNESS OF BREATH AND WHEEZING 08/05/20   Valentina Shaggy, MD  amLODipine (NORVASC) 5 MG tablet TAKE 1 TABLET (5 MG TOTAL) BY MOUTH DAILY. 09/24/20 12/23/20  Minus Breeding, MD  ascorbic acid (VITAMIN C) 500 MG tablet Take 500 mg by mouth daily.    [provider]  aspirin EC 81 MG tablet Take 81 mg by mouth daily. Swallow whole.    [provider]  atorvastatin (LIPITOR) 10 MG tablet Take 10 mg by mouth daily.    [provider]  budesonide (PULMICORT) 0.5 MG/2ML  nebulizer solution Take 2 mLs (0.5 mg total) by nebulization 2 (two) times daily. 07/23/20   Valentina Shaggy, MD  bumetanide (BUMEX) 2 MG tablet Take 2 mg by mouth daily. 03/05/20   [provider]  Calcium Carbonate (CALCIUM 600 PO) Take 1 tablet by mouth daily.    [provider]  cyclobenzaprine (FLEXERIL) 10 MG tablet Take 10 mg by mouth 2 (two) times daily as needed for muscle spasms.  08/09/19   [provider]  Fluticasone-Umeclidin-Vilant (TRELEGY ELLIPTA) 200-62.5-25 MCG/INH AEPB Inhale 1 puff into the lungs daily. 07/23/20   Valentina Shaggy, MD  formoterol (PERFOROMIST) 20 MCG/2ML nebulizer solution Take 2 mLs (20 mcg total) by nebulization 2 (two) times daily. 07/23/20   Valentina Shaggy, MD  furosemide (LASIX) 40 MG tablet Take 1 tablet (40 mg total) by mouth daily as needed for fluid or edema (WEIGHT GAIN OF 2LBS, SHORTNESS OF BREATH). 06/10/20 09/08/20  Minus Breeding, MD  HYDROcodone-acetaminophen (NORCO/VICODIN) 5-325 MG tablet Take 1 tablet by mouth every 6 (six) hours as needed. 01/15/20   Drenda Freeze, MD  ibuprofen (ADVIL) 800 MG tablet Take 1 tablet (800 mg total) by mouth every 8 (eight) hours as needed for mild pain. 06/07/20   Ward, Delice Bison, DO  ipratropium-albuterol (DUONEB) 0.5-2.5 (3) MG/3ML SOLN INHALE 1 VIAL VIA NEBULIZER EVERY 6 HOURS AS NEEDED FOR WHEEZING 09/24/20   Valentina Shaggy, MD  levocetirizine (XYZAL) 5 MG tablet Take 1 tablet (5 mg total) by mouth every evening. 07/23/20   Valentina Shaggy, MD  lidocaine (LIDODERM) 5 % Place 1 patch onto the skin daily. Remove & Discard patch within 12 hours or as directed by MD Patient not taking: Reported on 07/23/2020 06/07/20   Ward, Delice Bison, DO  losartan-hydrochlorothiazide (HYZAAR) 100-25 MG tablet Take 1 tablet by mouth daily. 05/24/20   Minus Breeding, MD  montelukast (SINGULAIR) 10 MG tablet Take 1 tablet (10 mg total) by mouth at bedtime. 07/23/20   Valentina Shaggy, MD  Multiple Vitamin (MULTIVITAMIN WITH MINERALS) TABS tablet Take 1 tablet by mouth daily. Centrum Multivitamin    [provider]  ondansetron (ZOFRAN ODT) 4 MG disintegrating tablet Take 1 tablet (4 mg total) by mouth every 6 (six) hours as needed. 06/07/20   Ward, Delice Bison, DO  oxyCODONE-acetaminophen (PERCOCET/ROXICET) 5-325 MG tablet Take 2 tablets by mouth every 6 (six) hours as needed. 06/07/20   Ward, Delice Bison, DO  pantoprazole (PROTONIX) 40 MG tablet TAKE 1 TABLET BY MOUTH EVERY DAY 06/17/20   Olalere, Adewale A, MD  predniSONE (STERAPRED UNI-PAK 21 TAB) 10 MG (21) TBPK tablet Take as directed Patient not taking: Reported on 07/23/2020 06/07/20   Ward, Delice Bison, DO  Vitamin D, Ergocalciferol, (DRISDOL) 1.25 MG (50000 UNIT) CAPS capsule Take 50,000 Units by mouth every Monday.  10/27/19   [provider]  zolpidem (AMBIEN) 10 MG tablet Take 10 mg by mouth at bedtime as needed for sleep.    [provider]    Allergies    Banana  Review of Systems   Review of Systems  All other systems reviewed and are negative.   Physical Exam Updated Vital Signs BP 137/72   Pulse (!) 112   Temp (!) 103.3 F (39.6 C) (Oral)   Resp 19   Ht 1.753 m (5' 9" )   Wt 131.5 kg   SpO2 97%   BMI 42.83 kg/m   Physical Exam Vitals and nursing note reviewed.  Constitutional:      General: She is not in acute distress.    Appearance: She is well-developed.  HENT:     Head: Normocephalic and atraumatic.     Right Ear: External ear normal.     Left Ear: External ear normal.  Eyes:     General: No scleral icterus.       Right eye: No discharge.        Left eye: No discharge.     Conjunctiva/sclera: Conjunctivae normal.  Neck:     Trachea: No tracheal deviation.  Cardiovascular:     Rate and Rhythm: Regular rhythm. Tachycardia present.  Pulmonary:     Effort: Pulmonary effort is normal. No respiratory distress.     Breath sounds: No stridor. Rhonchi present.  No wheezing or rales.  Abdominal:     General: Bowel sounds are normal. There is no distension.     Palpations: Abdomen is soft.     Tenderness: There is no abdominal tenderness. There is no guarding or rebound.  Musculoskeletal:        General: No tenderness.     Cervical back: Neck supple.  Skin:    General: Skin is warm and dry.     Findings: No rash.  Neurological:     Mental Status: She is alert.     Cranial Nerves: No cranial nerve deficit (no facial droop, extraocular movements intact, no slurred speech).     Sensory: No sensory deficit.     Motor: No abnormal muscle tone or seizure activity.     Coordination: Coordination normal.     ED Results / Procedures / Treatments   Labs (all labs ordered are listed, but only abnormal results are displayed) Labs Reviewed  CBC WITH DIFFERENTIAL/PLATELET - Abnormal; Notable for the following components:      Result Value   Hemoglobin 10.2 (*)    HCT 31.7 (*)    RDW 15.7 (*)    All other components within normal limits  URINE CULTURE  CULTURE, BLOOD (ROUTINE X 2)  CULTURE, BLOOD (ROUTINE X 2)  RESP PANEL BY RT-PCR (FLU A&B, COVID) ARPGX2  LACTIC ACID, PLASMA  LACTIC ACID, PLASMA  COMPREHENSIVE METABOLIC PANEL  PROTIME-INR  APTT  URINALYSIS, ROUTINE W REFLEX MICROSCOPIC    EKG EKG Interpretation  Date/Time:  Monday September 30 2020 23:01:08 EDT Ventricular Rate:  109 PR Interval:  139 QRS Duration: 96 QT Interval:  350 QTC Calculation: 472 R Axis:   73 Text Interpretation: Sinus tachycardia Multiple premature complexes, vent & supraven Minimal ST depression, diffuse leads No significant change since last tracing Confirmed by Dorie Rank [phone]) on 09/30/2020 11:09:55 PM   Radiology DG Chest Port 1 View  Result Date: 09/30/2020 CLINICAL DATA:  Questionable sepsis.  Fever, productive cough EXAM: PORTABLE CHEST 1 VIEW COMPARISON:  07/24/2020 FINDINGS: Heart is borderline in size. Mild hyperinflation of the lungs. No  confluent opacities or effusions. No acute bony abnormality. IMPRESSION: Borderline heart size, hyperinflation. Electronically Signed   By: Rolm Baptise M.D.   On: 09/30/2020 22:47    Procedures Procedures   Medications Ordered in ED Medications  lactated ringers bolus 1,000 mL (has no administration in time range)  acetaminophen (TYLENOL) tablet 650 mg (650 mg Oral Given 09/30/20 2242)    ED Course  I have reviewed the triage vital signs and the nursing notes.  Pertinent labs & imaging results that were available during my care of the patient were reviewed by me and considered in my medical decision making (see chart for details).    MDM Rules/Calculators/A&P                          Pt presents with cough, fever, body aches.  Sx concerning for possible, pna, covid, influenza.  Abd exam benign.  With tachycardia, fever, will screen for sepsis.  Appears non toxic at bedside.  Labs pending.  Care will be turned over to Dr Dina Rich.    Dorie Rank, MD 09/30/20 [phone]

## 2020-10-01 ENCOUNTER — Ambulatory Visit: Payer: Medicare Other | Admitting: Family

## 2020-10-01 LAB — URINALYSIS, ROUTINE W REFLEX MICROSCOPIC
Bilirubin Urine: NEGATIVE
Glucose, UA: NEGATIVE mg/dL
Hgb urine dipstick: NEGATIVE
Ketones, ur: NEGATIVE mg/dL
Leukocytes,Ua: NEGATIVE
Nitrite: NEGATIVE
Protein, ur: NEGATIVE mg/dL
Specific Gravity, Urine: 1.013 (ref 1.005–1.030)
pH: 6 (ref 5.0–8.0)

## 2020-10-01 LAB — COMPREHENSIVE METABOLIC PANEL
ALT: 19 U/L (ref 0–44)
AST: 20 U/L (ref 15–41)
Albumin: 3.9 g/dL (ref 3.5–5.0)
Alkaline Phosphatase: 88 U/L (ref 38–126)
Anion gap: 8 (ref 5–15)
BUN: 9 mg/dL (ref 8–23)
CO2: 26 mmol/L (ref 22–32)
Calcium: 9.2 mg/dL (ref 8.9–10.3)
Chloride: 103 mmol/L (ref 98–111)
Creatinine, Ser: 0.62 mg/dL (ref 0.44–1.00)
GFR, Estimated: >60 mL/min (ref >60)
Glucose, Bld: 104 mg/dL — ABNORMAL HIGH (ref 70–99)
Potassium: 3 mmol/L — ABNORMAL LOW (ref 3.5–5.1)
Sodium: 137 mmol/L (ref 135–145)
Total Bilirubin: 0.5 mg/dL (ref 0.3–1.2)
Total Protein: 6.7 g/dL (ref 6.5–8.1)

## 2020-10-01 LAB — LACTIC ACID, PLASMA
Lactic Acid, Venous: 0.8 mmol/L (ref 0.5–1.9)
Lactic Acid, Venous: 0.9 mmol/L (ref 0.5–1.9)

## 2020-10-01 MED ORDER — ALBUTEROL SULFATE (2.5 MG/3ML) 0.083% IN NEBU
5.0000 mg | INHALATION_SOLUTION | Freq: Once | RESPIRATORY_TRACT | Status: AC
  Administered 2020-10-01: 5 mg via RESPIRATORY_TRACT
  Filled 2020-10-01: qty 6

## 2020-10-01 MED ORDER — ALBUTEROL SULFATE HFA 108 (90 BASE) MCG/ACT IN AERS: 1.0000 | INHALATION_SPRAY | Freq: Four times a day (QID) | RESPIRATORY_TRACT | 0 refills | Status: DC | PRN

## 2020-10-01 MED ORDER — OSELTAMIVIR PHOSPHATE 75 MG PO CAPS: 75.0000 mg | ORAL_CAPSULE | Freq: Two times a day (BID) | ORAL | 0 refills | Status: DC

## 2020-10-01 MED ORDER — PREDNISONE 50 MG PO TABS
50.0000 mg | ORAL_TABLET | Freq: Once | ORAL | Status: AC
  Administered 2020-10-01: 50 mg via ORAL
  Filled 2020-10-01: qty 1

## 2020-10-01 MED ORDER — PREDNISONE 50 MG PO TABS: 50.0000 mg | ORAL_TABLET | Freq: Every day | ORAL | 0 refills | Status: DC

## 2020-10-01 NOTE — Discharge Instructions (Signed)
Take the medications as prescribed.  Follow-up with your doctor to be rechecked.  Rest and drink plenty of fluids

## 2020-10-01 NOTE — ED Provider Notes (Signed)
Patient signed out pending urinalysis.  Flu +.  Otherwise w/u reassuring.    Urinalysis reviewed.  No obvious UTI.  Patient clinically stable.  We will discharge her instructions provided by Dr. Tomi Bamberger.  Prescriptions at pharmacy.   Physical Exam  BP (!) 143/76   Pulse (!) 103   Temp (!) 103.3 F (39.6 C) (Oral)   Resp 19   Ht 1.753 m (5' 9" )   Wt 131.5 kg   SpO2 97%   BMI 42.83 kg/m   After history, exam, and medical workup I feel the patient has been appropriately medically screened and is safe for discharge home. Pertinent diagnoses were discussed with the patient. Patient was given return precautions.  Problem List Items Addressed This Visit   None   Visit Diagnoses    Influenza A    -  Primary   Relevant Medications   oseltamivir (TAMIFLU) 75 MG capsule   Bronchitis             Dina Rich, Barbette Hair, MD 10/01/20 [phone]

## 2020-10-01 NOTE — ED Notes (Signed)
Pt in restroom now to provide urine sample.

## 2020-10-02 LAB — URINE CULTURE

## 2020-10-03 ENCOUNTER — Ambulatory Visit: Payer: Self-pay

## 2020-10-06 LAB — CULTURE, BLOOD (ROUTINE X 2)
Culture: NO GROWTH
Culture: NO GROWTH
Special Requests: ADEQUATE

## 2020-10-07 ENCOUNTER — Ambulatory Visit: Payer: PRIVATE HEALTH INSURANCE | Admitting: Acute Care

## 2020-10-09 NOTE — Progress Notes (Signed)
@Patient  ID: Patty Howard, female    DOB: 12/13/1955, 65 y.o.   MRN: [id number]  Chief Complaint  Patient presents with  . Follow-up    Increased sob with exertion x 2 wks., Had flu 2 wks.ago. Cough-thick, green, clear. Wheezing, hoarseness.CPAP pr. Too high?    Referring provider: Buzzy Han*  HPI: 65 year old female, former smoker. PMH significant for asthma/COPD, OSA, HTN, aortic atherosclerosis, GERD, prediabetes, obesity. Patient of Dr. Ander Slade, last seen on 05/22/20. Spirometry in February 2022 showed moderate restriction. Maintained on Trelegey and CPAP. Ordered for CT chest in 6 months to monitor lung nodular opacities.   10/10/2020 Patient presents today for acute visit with complaint of voice hoarseness and shortness of breath. Dx with influenzxa last week, treated with tamiflu and prednison. She still has congested cough and wheezing. Associated vocie hoarsenss. Cough is productive with yellow-green mucus. She is compliant with Trelegy inhaler and has been using albuterol nebulizer 4-5 times a day.    Allergies  Allergen Reactions  . Banana Hives and Itching    Immunization History  Administered Date(s) Administered  . Influenza, High Dose Seasonal PF 03/06/2019  . Influenza,inj,Quad PF,6+ Mos 04/07/2018  . Influenza-Unspecified 03/29/2017, 03/05/2020  . Moderna SARS-COV2 Booster Vaccination 05/03/2020  . Moderna Sars-Covid-2 Vaccination 08/19/2019, 09/14/2019  . Pneumococcal Polysaccharide-23 03/29/2017  . Tdap 10/30/2017  . Zoster Recombinat (Shingrix) 03/30/2019    Past Medical History:  Diagnosis Date  . Anxiety   . Arthritis   . Asthma   . COPD (chronic obstructive pulmonary disease) ([place])   . Fracture    closed displaced left radial head  . Hypertension   . Sleep apnea    does not wear CPAP    Tobacco History: Social History   Tobacco Use  Smoking Status Former Smoker  . Packs/day: 0.50  . Years: 10.00  . Pack years: 5.00   . Types: Cigarettes  . Quit date: 06/15/1998  . Years since quitting: 22.3  Smokeless Tobacco Never Used   Counseling given: Not Answered   Outpatient Medications Prior to Visit  Medication Sig Dispense Refill  . amLODipine (NORVASC) 5 MG tablet TAKE 1 TABLET (5 MG TOTAL) BY MOUTH DAILY. 90 tablet 3  . ascorbic acid (VITAMIN C) 500 MG tablet Take 500 mg by mouth daily.    Marland Kitchen aspirin EC 81 MG tablet Take 81 mg by mouth daily. Swallow whole.    Marland Kitchen atorvastatin (LIPITOR) 10 MG tablet Take 10 mg by mouth daily.    . bumetanide (BUMEX) 2 MG tablet Take 2 mg by mouth daily.    . Calcium Carbonate (CALCIUM 600 PO) Take 1 tablet by mouth daily.    . cyclobenzaprine (FLEXERIL) 10 MG tablet Take 10 mg by mouth 2 (two) times daily as needed for muscle spasms.     Marland Kitchen HYDROcodone-acetaminophen (NORCO/VICODIN) 5-325 MG tablet Take 1 tablet by mouth every 6 (six) hours as needed. 10 tablet 0  . ibuprofen (ADVIL) 800 MG tablet Take 1 tablet (800 mg total) by mouth every 8 (eight) hours as needed for mild pain. 30 tablet 0  . levocetirizine (XYZAL) 5 MG tablet Take 1 tablet (5 mg total) by mouth every evening. 90 tablet 1  . losartan-hydrochlorothiazide (HYZAAR) 100-25 MG tablet Take 1 tablet by mouth daily. 90 tablet 3  . montelukast (SINGULAIR) 10 MG tablet Take 1 tablet (10 mg total) by mouth at bedtime. 30 tablet 2  . Multiple Vitamin (MULTIVITAMIN WITH MINERALS) TABS tablet Take 1 tablet by  mouth daily. Centrum Multivitamin    . ondansetron (ZOFRAN ODT) 4 MG disintegrating tablet Take 1 tablet (4 mg total) by mouth every 6 (six) hours as needed. 20 tablet 0  . oseltamivir (TAMIFLU) 75 MG capsule Take 1 capsule (75 mg total) by mouth 2 (two) times daily. 10 capsule 0  . oxyCODONE-acetaminophen (PERCOCET/ROXICET) 5-325 MG tablet Take 2 tablets by mouth every 6 (six) hours as needed. 16 tablet 0  . pantoprazole (PROTONIX) 40 MG tablet TAKE 1 TABLET BY MOUTH EVERY DAY 90 tablet 1  . Vitamin D,  Ergocalciferol, (DRISDOL) 1.25 MG (50000 UNIT) CAPS capsule Take 50,000 Units by mouth every Monday.     . zolpidem (AMBIEN) 10 MG tablet Take 10 mg by mouth at bedtime as needed for sleep.    Marland Kitchen albuterol (VENTOLIN HFA) 108 (90 Base) MCG/ACT inhaler Inhale 1-2 puffs into the lungs every 6 (six) hours as needed for wheezing or shortness of breath. (Patient not taking: Reported on 10/10/2020) 8 g 0  . Fluticasone-Umeclidin-Vilant (TRELEGY ELLIPTA) 200-62.5-25 MCG/INH AEPB Inhale 1 puff into the lungs daily. (Patient not taking: Reported on 10/10/2020) 30 each 5  . furosemide (LASIX) 40 MG tablet Take 1 tablet (40 mg total) by mouth daily as needed for fluid or edema (WEIGHT GAIN OF 2LBS, SHORTNESS OF BREATH). 30 tablet 3  . ipratropium-albuterol (DUONEB) 0.5-2.5 (3) MG/3ML SOLN INHALE 1 VIAL VIA NEBULIZER EVERY 6 HOURS AS NEEDED FOR WHEEZING (Patient not taking: Reported on 10/10/2020) 90 mL 10  . budesonide (PULMICORT) 0.5 MG/2ML nebulizer solution Take 2 mLs (0.5 mg total) by nebulization 2 (two) times daily. (Patient not taking: Reported on 10/10/2020) 360 mL 1  . formoterol (PERFOROMIST) 20 MCG/2ML nebulizer solution Take 2 mLs (20 mcg total) by nebulization 2 (two) times daily. (Patient not taking: Reported on 10/10/2020) 360 mL 1  . predniSONE (DELTASONE) 50 MG tablet Take 1 tablet (50 mg total) by mouth daily. (Patient not taking: Reported on 10/10/2020) 5 tablet 0   Facility-Administered Medications Prior to Visit  Medication Dose Route Frequency Provider Last Rate Last Admin  . Benralizumab SOSY 30 mg  30 mg Subcutaneous Q8 Toney Reil, MD   30 mg at 08/08/20 1033   Review of Systems  Review of Systems  Constitutional: Negative.   HENT: Positive for congestion.   Respiratory: Positive for cough, shortness of breath and wheezing.   Cardiovascular: Negative.      Physical Exam  BP (!) 142/74 (BP Location: Left Arm, Cuff Size: Large)   Pulse 91   Temp (!) 97.2 F (36.2 C)  (Temporal)   Ht 5' 10"  (1.778 m)   Wt 285 lb 6.4 oz (129.5 kg)   SpO2 100%   BMI 40.95 kg/m  Physical Exam Constitutional:      Appearance: Normal appearance.  HENT:     Mouth/Throat:     Mouth: Mucous membranes are moist.     Pharynx: Oropharynx is clear.  Cardiovascular:     Rate and Rhythm: Normal rate and regular rhythm.  Pulmonary:     Breath sounds: Wheezing and rhonchi present.  Skin:    General: Skin is warm and dry.  Neurological:     General: No focal deficit present.     Mental Status: She is alert and oriented to person, place, and time. Mental status is at baseline.  Psychiatric:        Mood and Affect: Mood normal.        Behavior: Behavior normal.  Thought Content: Thought content normal.        Judgment: Judgment normal.      Lab Results:  CBC    Component Value Date/Time   WBC 6.9 09/30/2020 2323   RBC 3.89 09/30/2020 2323   HGB 10.2 (L) 09/30/2020 2323   HGB 10.4 (L) 02/13/2020 1554   HCT 31.7 (L) 09/30/2020 2323   HCT 34.0 02/13/2020 1554   PLT 275 09/30/2020 2323   PLT 316 02/13/2020 1554   MCV 81.5 09/30/2020 2323   MCV 83 02/13/2020 1554   MCH 26.2 09/30/2020 2323   MCHC 32.2 09/30/2020 2323   RDW 15.7 (H) 09/30/2020 2323   RDW 14.0 02/13/2020 1554   LYMPHSABS 0.8 09/30/2020 2323   LYMPHSABS 2.3 02/13/2020 1554   MONOABS 0.5 09/30/2020 2323   EOSABS 0.0 09/30/2020 2323   EOSABS 0.0 02/13/2020 1554   BASOSABS 0.0 09/30/2020 2323   BASOSABS 0.0 02/13/2020 1554    BMET    Component Value Date/Time   NA 137 09/30/2020 2323   NA 142 06/26/2020 1306   K 3.0 (L) 09/30/2020 2323   CL 103 09/30/2020 2323   CO2 26 09/30/2020 2323   GLUCOSE 104 (H) 09/30/2020 2323   BUN 9 09/30/2020 2323   BUN 16 06/26/2020 1306   CREATININE 0.62 09/30/2020 2323   CALCIUM 9.2 09/30/2020 2323   GFRNONAA >60 09/30/2020 2323   GFRAA 90 06/26/2020 1306    BNP    Component Value Date/Time   BNP 51.6 11/27/2019 0413    ProBNP No results  found for: PROBNP  Imaging: DG Chest Port 1 View  Result Date: 09/30/2020 CLINICAL DATA:  Questionable sepsis.  Fever, productive cough EXAM: PORTABLE CHEST 1 VIEW COMPARISON:  07/24/2020 FINDINGS: Heart is borderline in size. Mild hyperinflation of the lungs. No confluent opacities or effusions. No acute bony abnormality. IMPRESSION: Borderline heart size, hyperinflation. Electronically Signed   By: Rolm Baptise M.D.   On: 09/30/2020 22:47   XR Elbow 2 Views Left  Result Date: 09/11/2020 Three-view x-rays left elbow including radial head view obtained and reviewed.  This shows radial head arthroplasty unchanged from March 2021 images.  Slight radiolucent line around the stem without shortening.  Slight calcification round adjacent to the capsule. 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  XR Wrist Complete Left  Result Date: 09/11/2020 Three-view x-rays left wrist obtained and reviewed.  This shows some degenerative changes of the first [facility] joint.  Negative for acute changes.  Mild radial scaphoid spurring. Impression: Left wrist negative for acute changes.  Normal radial ulnar length.    Assessment & Plan:   Acute asthma exacerbation - Secondary to recent influenza dx. Continues to have productive cough with wheezing. Lungs with scattered rhonchi and exp wheezing t/o. Received DepoMedrol 44m IM injection today. Checking CXR to ensure she does not have underlying pneumonia. Sending in additional prednisone taper 44mx 3 days; 3048m 3 days; 7m76m3 days; 10mg66m days. Continue Trelegy 200mcg37mly and Singulair 10mg q92mAdvised she continue to take Mucinex 1200mg tw72mdaily for 1 week and add flutter valve. FU in 2 weeks   Influenza - Positive for influenza on 09/30/20, treated with tamiflu     ElizabetMartyn Ehrich8/2022

## 2020-10-10 ENCOUNTER — Ambulatory Visit (INDEPENDENT_AMBULATORY_CARE_PROVIDER_SITE_OTHER): Payer: Medicare Other | Admitting: Primary Care

## 2020-10-10 ENCOUNTER — Other Ambulatory Visit: Payer: Self-pay

## 2020-10-10 ENCOUNTER — Ambulatory Visit (INDEPENDENT_AMBULATORY_CARE_PROVIDER_SITE_OTHER): Payer: Medicare Other

## 2020-10-10 ENCOUNTER — Encounter: Payer: Self-pay | Admitting: Primary Care

## 2020-10-10 VITALS — BP 142/74 | HR 91 | Temp 97.2°F | Ht 70.0 in | Wt 285.4 lb

## 2020-10-10 DIAGNOSIS — J101 Influenza due to other identified influenza virus with other respiratory manifestations: Secondary | ICD-10-CM | POA: Insufficient documentation

## 2020-10-10 DIAGNOSIS — J4551 Severe persistent asthma with (acute) exacerbation: Secondary | ICD-10-CM

## 2020-10-10 DIAGNOSIS — J111 Influenza due to unidentified influenza virus with other respiratory manifestations: Secondary | ICD-10-CM | POA: Diagnosis not present

## 2020-10-10 MED ORDER — ALBUTEROL SULFATE (2.5 MG/3ML) 0.083% IN NEBU: 2.5000 mg | INHALATION_SOLUTION | Freq: Four times a day (QID) | RESPIRATORY_TRACT | 2 refills | Status: DC | PRN

## 2020-10-10 MED ORDER — PREDNISONE 10 MG PO TABS: ORAL_TABLET | ORAL | 0 refills | Status: DC

## 2020-10-10 MED ORDER — METHYLPREDNISOLONE ACETATE 80 MG/ML IJ SUSP
80.0000 mg | Freq: Once | INTRAMUSCULAR | Status: AC
  Administered 2020-10-10: 80 mg via INTRAMUSCULAR

## 2020-10-10 NOTE — Assessment & Plan Note (Addendum)
-   Secondary to recent influenza dx. Continues to have productive cough with wheezing. Lungs with scattered rhonchi and exp wheezing t/o. Received DepoMedrol 61m IM injection today. Checking CXR to ensure she does not have underlying pneumonia. Sending in additional prednisone taper 457mx 3 days; 3016m 3 days; 36m101m3 days; 10mg60m days. Continue Trelegy 200mcg23mly and Singulair 10mg q90mAdvised she continue to take Mucinex 1200mg tw82mdaily for 1 week and add flutter valve. FU in 2 weeks

## 2020-10-10 NOTE — Patient Instructions (Addendum)
Recommendations: - Continue Trelegy Ellipta- take one puff daily in morning - Use albuterol nebulizer every 4-6 hours as needed for shortness of breath/wheezing - Sending in additional prednisone taper  - Continue Mucinex 675m- take 2 tablets twice a day for next 5-7 days  - Start Flonase nasal spray 1 puff per nostril daily  - Use flutter valve/Acapella device 3 times day (check amazon)  Orders: - Depo-medrol 816mIM re: asthma exacerbation  - CXR (ordered)   RX: - Prednisone taper as directed (start tomorrow)  Follow-up: - 2 week visit with [employer]

## 2020-10-10 NOTE — Addendum Note (Signed)
Addended by: Martyn Ehrich on: 10/10/2020 01:43 PM   Modules accepted: Orders

## 2020-10-10 NOTE — Addendum Note (Signed)
Addended by: Mathis Bud on: 10/10/2020 12:58 PM   Modules accepted: Orders

## 2020-10-10 NOTE — Assessment & Plan Note (Signed)
-   Positive for influenza on 09/30/20, treated with tamiflu

## 2020-10-11 ENCOUNTER — Telehealth: Payer: Self-pay | Admitting: Primary Care

## 2020-10-11 NOTE — Telephone Encounter (Signed)
Pt returned call. Stated to pt the results of cxr and she verbalized understanding. Nothing further needed.

## 2020-10-11 NOTE — Telephone Encounter (Signed)
Martyn Ehrich, NP  Rayvon Dakin, Waldemar Dickens, CMA CXR showed evidence of bronchitis. No indication for abx at this time. Continue mucinex, bronchodilators and steriods. If worsening call me, otherwise ill see her on May 12th    Attempted to call pt but line went directly to VM. Left message for her to return call.

## 2020-10-11 NOTE — Progress Notes (Signed)
CXR showed evidence of bronchitis. No indication for abx at this time. Continue mucinex, bronchodilators and steriods. If worsening call me, otherwise ill see her on May 12th

## 2020-10-11 NOTE — Progress Notes (Signed)
Called and left message on voicemail to please return phone call to go over xray results. Contact number provided.

## 2020-10-13 ENCOUNTER — Ambulatory Visit
Admission: RE | Admit: 2020-10-13 | Discharge: 2020-10-13 | Disposition: A | Discharge status: Home / Self Care | Payer: PRIVATE HEALTH INSURANCE | Source: Ambulatory Visit | Attending: Internal Medicine | Admitting: Internal Medicine

## 2020-10-13 ENCOUNTER — Other Ambulatory Visit: Payer: Self-pay

## 2020-10-13 DIAGNOSIS — M545 Low back pain, unspecified: Secondary | ICD-10-CM

## 2020-10-13 MED ORDER — GADOBENATE DIMEGLUMINE 529 MG/ML IV SOLN
20.0000 mL | Freq: Once | INTRAVENOUS | Status: AC | PRN
  Administered 2020-10-13: 20 mL via INTRAVENOUS

## 2020-10-15 ENCOUNTER — Other Ambulatory Visit: Payer: Self-pay | Admitting: Pulmonary Disease

## 2020-10-22 ENCOUNTER — Ambulatory Visit: Payer: Medicare Other | Admitting: Allergy & Immunology

## 2020-10-24 ENCOUNTER — Encounter: Payer: Self-pay | Admitting: Primary Care

## 2020-10-24 ENCOUNTER — Other Ambulatory Visit: Payer: Self-pay

## 2020-10-24 ENCOUNTER — Ambulatory Visit (INDEPENDENT_AMBULATORY_CARE_PROVIDER_SITE_OTHER): Payer: Medicare Other | Admitting: Primary Care

## 2020-10-24 DIAGNOSIS — J4551 Severe persistent asthma with (acute) exacerbation: Secondary | ICD-10-CM | POA: Diagnosis not present

## 2020-10-24 MED ORDER — ALBUTEROL SULFATE HFA 108 (90 BASE) MCG/ACT IN AERS: 1.0000 | INHALATION_SPRAY | Freq: Four times a day (QID) | RESPIRATORY_TRACT | 0 refills | Status: DC | PRN

## 2020-10-24 MED ORDER — TRELEGY ELLIPTA 200-62.5-25 MCG/INH IN AEPB: 1.0000 | INHALATION_SPRAY | Freq: Every day | RESPIRATORY_TRACT | 5 refills | Status: DC

## 2020-10-24 MED ORDER — IPRATROPIUM-ALBUTEROL 0.5-2.5 (3) MG/3ML IN SOLN: 3.0000 mL | Freq: Four times a day (QID) | RESPIRATORY_TRACT | 3 refills | Status: DC | PRN

## 2020-10-24 NOTE — Progress Notes (Signed)
@Patient  ID: Patty Howard, female    DOB: 07/09/1955, 65 y.o.   MRN: [id number]  Chief Complaint  Patient presents with  . Follow-up    Doing better    Referring provider: Buzzy Han*  HPI: 65 year old female, former smoker. PMH significant for asthma/COPD, OSA, HTN, aortic atherosclerosis, GERD, prediabetes, obesity. Patient of Dr. Ander Slade, last seen on 05/22/20. Spirometry in February 2022 showed moderate restriction. Maintained on Trelegey and CPAP. Ordered for CT chest in 6 months to monitor lung nodular opacities.   Previous [facility] pulmonary encounter:  10/10/2020 Patient presents today for acute visit with complaint of voice hoarseness and shortness of breath. Dx with influenzxa last week, treated with tamiflu and prednison. She still has congested cough and wheezing. Associated vocie hoarsenss. Cough is productive with yellow-green mucus. She is compliant with Trelegy inhaler and has been using albuterol nebulizer 4-5 times a day.   10/24/2020- Interim hx  Patient presents today for 2 week follow-up asthma exacerbation secondary to influenza. During our visit on 10/10/20 she received Depo-medrol shot and was given prednisone taper. CXR had showed mild bronchitis, no consolidation. She then went to an urgent care about a week after this and was placed on an antibiotic for 3 days (she is unsure of the name) . She was told to stop Trelegy and start budesonide nebulizer until better. She reports that her wheezing has calmed down. She is using flutter valve several times a day. She is taking mucinex twice a day.   Allergies  Allergen Reactions  . Banana Hives and Itching    Immunization History  Administered Date(s) Administered  . Influenza, High Dose Seasonal PF 03/06/2019  . Influenza,inj,Quad PF,6+ Mos 04/07/2018  . Influenza-Unspecified 03/29/2017, 03/05/2020  . Moderna SARS-COV2 Booster Vaccination 05/03/2020  . Moderna Sars-Covid-2 Vaccination 08/19/2019,  09/14/2019  . Pneumococcal Polysaccharide-23 03/29/2017  . Tdap 10/30/2017  . Zoster Recombinat (Shingrix) 03/30/2019    Past Medical History:  Diagnosis Date  . Anxiety   . Arthritis   . Asthma   . COPD (chronic obstructive pulmonary disease) ([place])   . Fracture    closed displaced left radial head  . Hypertension   . Sleep apnea    does not wear CPAP    Tobacco History: Social History   Tobacco Use  Smoking Status Former Smoker  . Packs/day: 0.50  . Years: 10.00  . Pack years: 5.00  . Types: Cigarettes  . Quit date: 06/15/1998  . Years since quitting: 22.3  Smokeless Tobacco Never Used   Counseling given: Yes   Outpatient Medications Prior to Visit  Medication Sig Dispense Refill  . albuterol (PROVENTIL) (2.5 MG/3ML) 0.083% nebulizer solution Take 3 mLs (2.5 mg total) by nebulization every 6 (six) hours as needed for wheezing or shortness of breath. 75 mL 2  . amLODipine (NORVASC) 5 MG tablet TAKE 1 TABLET (5 MG TOTAL) BY MOUTH DAILY. 90 tablet 3  . ascorbic acid (VITAMIN C) 500 MG tablet Take 500 mg by mouth daily.    Marland Kitchen aspirin EC 81 MG tablet Take 81 mg by mouth daily. Swallow whole.    Marland Kitchen atorvastatin (LIPITOR) 10 MG tablet Take 10 mg by mouth daily.    . bumetanide (BUMEX) 2 MG tablet Take 2 mg by mouth daily.    . Calcium Carbonate (CALCIUM 600 PO) Take 1 tablet by mouth daily.    . cyclobenzaprine (FLEXERIL) 10 MG tablet Take 10 mg by mouth 2 (two) times daily as needed for muscle  spasms.     Marland Kitchen HYDROcodone-acetaminophen (NORCO/VICODIN) 5-325 MG tablet Take 1 tablet by mouth every 6 (six) hours as needed. 10 tablet 0  . ibuprofen (ADVIL) 800 MG tablet Take 1 tablet (800 mg total) by mouth every 8 (eight) hours as needed for mild pain. 30 tablet 0  . levocetirizine (XYZAL) 5 MG tablet Take 1 tablet (5 mg total) by mouth every evening. 90 tablet 1  . losartan-hydrochlorothiazide (HYZAAR) 100-25 MG tablet Take 1 tablet by mouth daily. 90 tablet 3  . montelukast  (SINGULAIR) 10 MG tablet Take 1 tablet (10 mg total) by mouth at bedtime. 30 tablet 2  . Multiple Vitamin (MULTIVITAMIN WITH MINERALS) TABS tablet Take 1 tablet by mouth daily. Centrum Multivitamin    . ondansetron (ZOFRAN ODT) 4 MG disintegrating tablet Take 1 tablet (4 mg total) by mouth every 6 (six) hours as needed. 20 tablet 0  . oseltamivir (TAMIFLU) 75 MG capsule Take 1 capsule (75 mg total) by mouth 2 (two) times daily. 10 capsule 0  . oxyCODONE-acetaminophen (PERCOCET/ROXICET) 5-325 MG tablet Take 2 tablets by mouth every 6 (six) hours as needed. 16 tablet 0  . pantoprazole (PROTONIX) 40 MG tablet TAKE 1 TABLET BY MOUTH EVERY DAY 90 tablet 1  . predniSONE (DELTASONE) 10 MG tablet Take 4 tabs po daily x 3 days; then 3 tabs daily x3 days; then 2 tabs daily x3 days; then 1 tab daily x 3 days; then stop 30 tablet 0  . Vitamin D, Ergocalciferol, (DRISDOL) 1.25 MG (50000 UNIT) CAPS capsule Take 50,000 Units by mouth every Monday.     . zolpidem (AMBIEN) 10 MG tablet Take 10 mg by mouth at bedtime as needed for sleep.    Marland Kitchen albuterol (VENTOLIN HFA) 108 (90 Base) MCG/ACT inhaler Inhale 1-2 puffs into the lungs every 6 (six) hours as needed for wheezing or shortness of breath. 8 g 0  . Fluticasone-Umeclidin-Vilant (TRELEGY ELLIPTA) 200-62.5-25 MCG/INH AEPB Inhale 1 puff into the lungs daily. 30 each 5  . ipratropium-albuterol (DUONEB) 0.5-2.5 (3) MG/3ML SOLN Take 3 mLs by nebulization every 6 (six) hours as needed.    . furosemide (LASIX) 40 MG tablet Take 1 tablet (40 mg total) by mouth daily as needed for fluid or edema (WEIGHT GAIN OF 2LBS, SHORTNESS OF BREATH). 30 tablet 3   Facility-Administered Medications Prior to Visit  Medication Dose Route Frequency Provider Last Rate Last Admin  . Benralizumab SOSY 30 mg  30 mg Subcutaneous Q8 Toney Reil, MD   30 mg at 08/08/20 1033    Review of Systems  Review of Systems  Constitutional: Negative.   HENT: Positive for congestion.    Respiratory: Positive for cough, shortness of breath and wheezing.   Cardiovascular: Negative.     Physical Exam  BP 124/88 (BP Location: Left Wrist, Cuff Size: Normal)   Pulse 98   Temp (!) 97.3 F (36.3 C) (Oral)   Ht 5' 8"  (1.727 m)   Wt 288 lb 6.4 oz (130.8 kg)   SpO2 97%   BMI 43.85 kg/m  Physical Exam Constitutional:      Appearance: Normal appearance.  HENT:     Head: Normocephalic and atraumatic.  Cardiovascular:     Rate and Rhythm: Normal rate and regular rhythm.  Pulmonary:     Breath sounds: Wheezing and rhonchi present.  Musculoskeletal:        General: Normal range of motion.  Skin:    General: Skin is warm and dry.  Neurological:     General: No focal deficit present.     Mental Status: She is alert and oriented to person, place, and time. Mental status is at baseline.  Psychiatric:        Mood and Affect: Mood normal.        Behavior: Behavior normal.        Thought Content: Thought content normal.        Judgment: Judgment normal.      Lab Results:  CBC    Component Value Date/Time   WBC 6.9 09/30/2020 2323   RBC 3.89 09/30/2020 2323   HGB 10.2 (L) 09/30/2020 2323   HGB 10.4 (L) 02/13/2020 1554   HCT 31.7 (L) 09/30/2020 2323   HCT 34.0 02/13/2020 1554   PLT 275 09/30/2020 2323   PLT 316 02/13/2020 1554   MCV 81.5 09/30/2020 2323   MCV 83 02/13/2020 1554   MCH 26.2 09/30/2020 2323   MCHC 32.2 09/30/2020 2323   RDW 15.7 (H) 09/30/2020 2323   RDW 14.0 02/13/2020 1554   LYMPHSABS 0.8 09/30/2020 2323   LYMPHSABS 2.3 02/13/2020 1554   MONOABS 0.5 09/30/2020 2323   EOSABS 0.0 09/30/2020 2323   EOSABS 0.0 02/13/2020 1554   BASOSABS 0.0 09/30/2020 2323   BASOSABS 0.0 02/13/2020 1554    BMET    Component Value Date/Time   NA 137 09/30/2020 2323   NA 142 06/26/2020 1306   K 3.0 (L) 09/30/2020 2323   CL 103 09/30/2020 2323   CO2 26 09/30/2020 2323   GLUCOSE 104 (H) 09/30/2020 2323   BUN 9 09/30/2020 2323   BUN 16 06/26/2020 1306    CREATININE 0.62 09/30/2020 2323   CALCIUM 9.2 09/30/2020 2323   GFRNONAA >60 09/30/2020 2323   GFRAA 90 06/26/2020 1306    BNP    Component Value Date/Time   BNP 51.6 11/27/2019 0413    ProBNP No results found for: PROBNP  Imaging: DG Chest 2 View  Result Date: 10/10/2020 CLINICAL DATA:  Bronchitis and influenza EXAM: CHEST - 2 VIEW COMPARISON:  September 30, 2020 FINDINGS: Interstitium is slightly prominent. No edema or airspace opacity. Heart is upper normal in size with pulmonary vascularity normal. No adenopathy. No bone lesions. IMPRESSION: Mild interstitial prominence, likely indicating underlying bronchitis. No edema or consolidation. Heart is normal in size. No adenopathy. Electronically Signed   By: Lowella Grip III M.D.   On: 10/10/2020 12:26   MR Lumbar Spine W Wo Contrast  Result Date: 10/14/2020 CLINICAL DATA:  Low back pain EXAM: MRI LUMBAR SPINE WITHOUT AND WITH CONTRAST TECHNIQUE: Multiplanar and multiecho pulse sequences of the lumbar spine were obtained without and with intravenous contrast. CONTRAST:  8m MULTIHANCE GADOBENATE DIMEGLUMINE 529 MG/ML IV SOLN COMPARISON:  None. FINDINGS: Segmentation:  Standard. Alignment:  Trace anterolisthesis at L4-L5 and L5-S1. Vertebrae: Vertebral body heights are maintained. There is no substantial marrow edema. No suspicious osseous lesion. Conus medullaris and cauda equina: Conus extends to the T12-L1 level. Conus and cauda equina appear normal. Paraspinal and other soft tissues: Unremarkable. 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 L1-L2: Prominent epidural fat mildly flattening the thecal sac. No foraminal stenosis. L2-L3: Small left foraminal protrusion. Mild facet arthropathy. Prominent dorsal epidural fat mildly flattening the thecal sac. No right foraminal stenosis. Minor left foraminal stenosis. L3-L4: Disc bulge. Marked right and mild left facet arthropathy with ligamentum flavum infolding. Moderate canal  stenosis. Narrowing of the subarticular recesses. Mild foraminal stenosis. L4-L5: Disc bulge. Marked facet  arthropathy with prominent hypertrophic changes and ligamentum flavum infolding. Marked canal stenosis. Narrowing of the subarticular recesses. Mild right foraminal stenosis. No left foraminal stenosis. L5-S1: Disc bulge. Marked facet arthropathy with prominent hypertrophic changes and ligamentum flavum infolding. Minor canal stenosis. Mild foraminal stenosis. IMPRESSION: Multilevel degenerative changes as detailed above. Canal stenosis is greatest L4-L5. There is marked lower lumbar facet arthropathy. Electronically Signed   By: Macy Mis M.D.   On: 10/14/2020 10:29   DG Chest Port 1 View  Result Date: 09/30/2020 CLINICAL DATA:  Questionable sepsis.  Fever, productive cough EXAM: PORTABLE CHEST 1 VIEW COMPARISON:  07/24/2020 FINDINGS: Heart is borderline in size. Mild hyperinflation of the lungs. No confluent opacities or effusions. No acute bony abnormality. IMPRESSION: Borderline heart size, hyperinflation. Electronically Signed   By: Rolm Baptise M.D.   On: 09/30/2020 22:47     Assessment & Plan:   Acute asthma exacerbation - Secondary to influenza, slowing improving. Treated with Tamiflu. Received Depomedrol and prednisone taper during last office visit. CXR on 10/10/20 showed mild interstitial prominence reflecting underlying bronchitis. She still has some scattered rhonchi and wheezing t/o lung fields but overall improved. Continue Mucinex 1211m BID, flutter valve and prn albuterol nebulizer 6 hours for sob/whezing. Resume Trelegy 2030m  one puff daily; she can use budesonide nebulizer as needed for wheezing morning and evening for 1 week then stop. No additional oral steroids recommended at this time. Follow-up in 2 weeks or sooner if needed.        ElMartyn EhrichNP 10/28/2020

## 2020-10-24 NOTE — Patient Instructions (Signed)
Recommendations: - Resume Trelegy one puff in the morning (rinse mouth after use) - You can continue to use budesonide nebulizer as needed for wheezing morning and evening for 1 week then stop (this is an additional steroid treatment) - Use Albuterol nebulizer every 6 hours as needed ONLY for shortness of breath (this is a bronchodilator)  - Continue to take Mucinex 1262m twice a day with water - Use flutter valve three-four times a day   Follow-up: 2 weeks with BEustaquio MaizeNP     Fluticasone; Umeclidinium; Vilanterol inhalation powder What is this medicine? FLUTICASONE; UMECLIDINIUM; VILANTEROL (floo TIK a sone; ue MEK li DIN ee um; vye LAN ter ol) inhalation is a combination of 3 drugs to treat COPD and asthma. Umeclidinium and Vilanterol are bronchodilators that help keep airways open. Fluticasone decreases inflammation in the lungs. Do not use this drug combination for acute asthma attacks or bronchospasm. This medicine may be used for other purposes; ask your health care provider or pharmacist if you have questions. COMMON BRAND NAME(S): TRELEGY ELLIPTA What should I tell my health care provider before I take this medicine? They need to know if you have any of these conditions:  bone problems  diabetes  eye disease, vision problems  heart disease  high blood pressure  history of irregular heartbeat  immune system problems  infection  kidney disease  pheochromocytoma  prostate disease  seizures  thyroid disease  trouble passing urine  an unusual or allergic reaction to fluticasone, umeclidinium, vilanterol, lactose, milk proteins, other medicines, foods, dyes, or preservatives  pregnant or trying to get pregnant  breast-feeding How should I use this medicine? This drug is inhaled through the mouth. Rinse your mouth with water after use. Make sure not to swallow the water. Take it as directed on the prescription label at the same time every day. Do not use it more  often than directed. A special MedGuide will be given to you by the pharmacist with each prescription and refill. Be sure to read this information carefully each time. Talk to your pediatrician about the use of this drug in children. Special care may be needed. Overdosage: If you think you have taken too much of this medicine contact a poison control center or emergency room at once. NOTE: This medicine is only for you. Do not share this medicine with others. What if I miss a dose? If you miss a dose, take it as soon as you can. If it is almost time for your next dose, take only that dose. Do not take double or extra doses. What may interact with this medicine? Do not take this medicine with any of the following medications:  cisapride  dofetilide  dronedarone  MAOIs like Carbex, Eldepryl, Marplan, Nardil, and Parnate  pimozide  thioridazine  ziprasidone This medicine may also interact with the following medications:  aclidinium  antihistamines for allergy  antiviral medicines for HIV or AIDS  atropine  beta-blockers like metoprolol and propranolol  certain antibiotics like clarithromycin and telithromycin  certain medicines for bladder problems like oxybutynin, tolterodine  certain medicines for depression, anxiety, or psychotic disturbances  certain medicines for fungal infections like ketoconazole, itraconazole, posaconazole, voriconazole  certain medicines for Parkinson's disease like benztropine, trihexyphenidyl  certain medicines for stomach problems like dicyclomine, hyoscyamine  certain medicines for travel sickness like scopolamine  conivaptan  diuretics  ipratropium  medicines for colds  other medicines for breathing problems  other medicines that prolong the QT interval (cause an abnormal heart  rhythm)  nefazodone  tiotropium This list may not describe all possible interactions. Give your health care provider a list of all the medicines,  herbs, non-prescription drugs, or dietary supplements you use. Also tell them if you smoke, drink alcohol, or use illegal drugs. Some items may interact with your medicine. What should I watch for while using this medicine? Visit your doctor or health care professional for regular checkups. Tell your doctor or health care professional if your symptoms do not get better. Do not use this medicine more than once every 24 hours. NEVER use this medicine for an acute asthma or COPD attack. You should use your short-acting rescue inhalers for this purpose. If your symptoms get worse or if you need your short-acting inhalers more often, call your doctor right away. If you are going to have surgery tell your doctor or health care professional that you are using this medicine. Try not to come in contact with people with the chicken pox or measles. If you do, call your doctor. This medicine may increase blood sugar. Ask your healthcare provider if changes in diet or medicines are needed if you have diabetes. What side effects may I notice from receiving this medicine? Side effects that you should report to your doctor or health care professional as soon as possible:  allergic reactions like skin rash or hives, swelling of the face, lips, or tongue  breathing problems right after inhaling your medicine  chest pain  eye pain  fast, irregular heartbeat  feeling faint or lightheaded, falls  fever or chills  nausea, vomiting  signs and symptoms of high blood sugar such as being more thirsty or hungry or having to urinate more than normal. You may also feel very tired or have blurry vision.  trouble passing urine Side effects that usually do not require medical attention (report these to your doctor or health care professional if they continue or are bothersome):  back pain  changes in taste  cough  diarrhea  headache  nervousness  sore throat  tremor This list may not describe all  possible side effects. Call your doctor for medical advice about side effects. You may report side effects to FDA at 1-800-FDA-1088. Where should I keep my medicine? Keep out of the reach of children and pets. Store at room temperature between 20 and 25 degrees C (68 and 77 degrees F). Keep inhaler away from extreme heat, cold or humidity. Throw away 6 weeks after removing it from the foil pouch, when the dose counter reads "0" or after the expiration date, whichever is first. NOTE: This sheet is a summary. It may not cover all possible information. If you have questions about this medicine, talk to your doctor, pharmacist, or health care provider.  2021 Elsevier/Gold Standard (2019-04-10 12:45:04)

## 2020-10-28 ENCOUNTER — Encounter: Payer: Self-pay | Admitting: Primary Care

## 2020-10-28 NOTE — Assessment & Plan Note (Addendum)
-   Secondary to influenza, slowing improving. Treated with Tamiflu. Received Depomedrol and prednisone taper during last office visit. CXR on 10/10/20 showed mild interstitial prominence reflecting underlying bronchitis. She still has some scattered rhonchi and wheezing t/o lung fields but overall improved. Continue Mucinex 1225m BID, flutter valve and prn albuterol nebulizer 6 hours for sob/whezing. Resume Trelegy 2026m  one puff daily; she can use budesonide nebulizer as needed for wheezing morning and evening for 1 week then stop. No additional oral steroids recommended at this time. Follow-up in 2 weeks or sooner if needed.

## 2020-10-30 ENCOUNTER — Telehealth: Payer: Self-pay

## 2020-10-30 NOTE — Telephone Encounter (Signed)
Patient called and wants to know how she cn order more Berna Bue as we do not have her Berna Bue in the [place] office. I gave patient your number to contact you. Patient was last seen in February 2022. Thank you.

## 2020-11-04 ENCOUNTER — Other Ambulatory Visit: Payer: Self-pay | Admitting: Pulmonary Disease

## 2020-11-04 NOTE — Telephone Encounter (Signed)
Called [place] [facility] program and ordered same.  They were having issue with her prescription but have sent in two this year.  Advised patient of their issue and advise they should deliver in 3-5 bus days and called [place] to put reminder up that when her Berna Bue comes in to contact patient to come in for injection

## 2020-11-06 ENCOUNTER — Telehealth: Payer: Self-pay | Admitting: Primary Care

## 2020-11-06 NOTE — Telephone Encounter (Signed)
Pt has an appt tomorrow 11/07/20 at 9:30 with Beth. Per [name], see if pt is okay with changing her appt to a televisit.  Attempted to call pt to see if she was okay with her appt being changed to a televisit or if she would rather come in to the office for her appt but unable to reach pt. Left pt a detailed message on machine about this and also asked for pt to return call. Stated to her in the message that if she is fine with her appt being a televisit that our front staff can be able to make that change.  Will await return call from pt.

## 2020-11-07 ENCOUNTER — Ambulatory Visit (INDEPENDENT_AMBULATORY_CARE_PROVIDER_SITE_OTHER): Payer: Medicare Other | Admitting: *Deleted

## 2020-11-07 ENCOUNTER — Other Ambulatory Visit: Payer: Self-pay

## 2020-11-07 ENCOUNTER — Ambulatory Visit: Payer: PRIVATE HEALTH INSURANCE | Admitting: Primary Care

## 2020-11-07 DIAGNOSIS — J4550 Severe persistent asthma, uncomplicated: Secondary | ICD-10-CM

## 2020-11-08 ENCOUNTER — Ambulatory Visit (INDEPENDENT_AMBULATORY_CARE_PROVIDER_SITE_OTHER): Payer: Medicare Other | Admitting: Primary Care

## 2020-11-08 ENCOUNTER — Encounter: Payer: Self-pay | Admitting: Primary Care

## 2020-11-08 DIAGNOSIS — J4551 Severe persistent asthma with (acute) exacerbation: Secondary | ICD-10-CM | POA: Diagnosis not present

## 2020-11-08 DIAGNOSIS — Z780 Asymptomatic menopausal state: Secondary | ICD-10-CM | POA: Insufficient documentation

## 2020-11-08 HISTORY — DX: Asymptomatic menopausal state: Z78.0

## 2020-11-08 MED ORDER — IPRATROPIUM-ALBUTEROL 0.5-2.5 (3) MG/3ML IN SOLN: 3.0000 mL | Freq: Four times a day (QID) | RESPIRATORY_TRACT | 3 refills | Status: DC | PRN

## 2020-11-08 NOTE — Progress Notes (Signed)
@Patient  ID: Patty Howard, female    DOB: August 16, 1955, 65 y.o.   MRN: [id number]  Chief Complaint  Patient presents with  . Asthma    Referring provider: Buzzy Han*  HPI: 65 year old female, former smoker quit in 2000 and (5-pack-year history).  History significant for OSA, asthma with COPD, hypertension, aortic arthrosclerosis, GERD, obesity.  Patient of Dr. Ander Slade, last seen by pulmonary NP 10/24/20. Spirometry in February 2022 showed moderate restriction. Maintained on Trelegey and CPAP. Ordered for CT chest in 6 months to monitor lung nodular opacities.    Previous [facility] pulmonary encounter:  10/10/2020 Patient presents today for acute visit with complaint of voice hoarseness and shortness of breath. Dx with influenzxa last week, treated with tamiflu and prednison. She still has congested cough and wheezing. Associated vocie hoarsenss. Cough is productive with yellow-green mucus. She is compliant with Trelegy inhaler and has been using albuterol nebulizer 4-5 times a day.   10/24/2020 Patient presents today for 2 week follow-up asthma exacerbation secondary to influenza. During our visit on 10/10/20 she received Depo-medrol shot and was given prednisone taper. CXR had showed mild bronchitis, no consolidation. She then went to an urgent care about a week after this and was placed on an antibiotic for 3 days (she is unsure of the name) . She was told to stop Trelegy and start budesonide nebulizer until better. She reports that her wheezing has calmed down. She is using flutter valve several times a day. She is taking mucinex twice a day.   11/08/2020  Interim hx  Patient presents today for 2 week follow-up. She resumed Trelegy 200 at last visit and stopped budesonide nebulizer. Breathing is "a little better than it was". She reports being able to walk further distances without becoming winded. Wheezing has also improved a great deal. She still has congested cough with thick  mucus, occasionally purulent. She has been using duoneb 4 times a day scheduled. She is taking mucinex and has been using flutter valve. She was vaccinated for influenza and has had 3 covid vaccines. No f/c/s, nasal congestion, chest tightness. She has upcoming follow CT chest on 11/15/20.    Allergies  Allergen Reactions  . Banana Hives and Itching    Immunization History  Administered Date(s) Administered  . Influenza, High Dose Seasonal PF 03/06/2019, 03/13/2019  . Influenza,inj,Quad PF,6+ Mos 04/07/2018  . Influenza-Unspecified 03/29/2017, 03/05/2020  . Moderna SARS-COV2 Booster Vaccination 05/03/2020  . Moderna Sars-Covid-2 Vaccination 08/19/2019, 09/14/2019  . Pneumococcal Polysaccharide-23 03/29/2017  . Tdap 10/30/2017  . Zoster Recombinat (Shingrix) 03/30/2019    Past Medical History:  Diagnosis Date  . Anxiety   . Arthritis   . Asthma   . COPD (chronic obstructive pulmonary disease) ([place])   . Fracture    closed displaced left radial head  . Hypertension   . Sleep apnea    does not wear CPAP    Tobacco History: Social History   Tobacco Use  Smoking Status Former Smoker  . Packs/day: 0.50  . Years: 10.00  . Pack years: 5.00  . Types: Cigarettes  . Quit date: 06/15/1998  . Years since quitting: 22.4  Smokeless Tobacco Never Used   Counseling given: Not Answered   Outpatient Medications Prior to Visit  Medication Sig Dispense Refill  . albuterol (VENTOLIN HFA) 108 (90 Base) MCG/ACT inhaler Inhale 1-2 puffs into the lungs every 6 (six) hours as needed for wheezing or shortness of breath. 8 g 0  . amLODipine (NORVASC) 5 MG tablet TAKE  1 TABLET (5 MG TOTAL) BY MOUTH DAILY. 90 tablet 3  . ascorbic acid (VITAMIN C) 500 MG tablet Take 500 mg by mouth daily.    Marland Kitchen aspirin EC 81 MG tablet Take 81 mg by mouth daily. Swallow whole.    Marland Kitchen atorvastatin (LIPITOR) 10 MG tablet Take 10 mg by mouth daily.    . bumetanide (BUMEX) 2 MG tablet Take 2 mg by mouth daily.    .  Calcium Carbonate (CALCIUM 600 PO) Take 1 tablet by mouth daily.    . cyclobenzaprine (FLEXERIL) 10 MG tablet Take 10 mg by mouth 2 (two) times daily as needed for muscle spasms.     . Fluticasone-Umeclidin-Vilant (TRELEGY ELLIPTA) 200-62.5-25 MCG/INH AEPB Inhale 1 puff into the lungs daily. 30 each 5  . furosemide (LASIX) 40 MG tablet Take 1 tablet (40 mg total) by mouth daily as needed for fluid or edema (WEIGHT GAIN OF 2LBS, SHORTNESS OF BREATH). 30 tablet 3  . HYDROcodone-acetaminophen (NORCO/VICODIN) 5-325 MG tablet Take 1 tablet by mouth every 6 (six) hours as needed. 10 tablet 0  . ibuprofen (ADVIL) 800 MG tablet Take 1 tablet (800 mg total) by mouth every 8 (eight) hours as needed for mild pain. 30 tablet 0  . levocetirizine (XYZAL) 5 MG tablet Take 1 tablet (5 mg total) by mouth every evening. 90 tablet 1  . losartan-hydrochlorothiazide (HYZAAR) 100-25 MG tablet Take 1 tablet by mouth daily. 90 tablet 3  . montelukast (SINGULAIR) 10 MG tablet Take 1 tablet (10 mg total) by mouth at bedtime. 30 tablet 2  . Multiple Vitamin (MULTIVITAMIN WITH MINERALS) TABS tablet Take 1 tablet by mouth daily. Centrum Multivitamin    . ondansetron (ZOFRAN ODT) 4 MG disintegrating tablet Take 1 tablet (4 mg total) by mouth every 6 (six) hours as needed. 20 tablet 0  . oxyCODONE-acetaminophen (PERCOCET/ROXICET) 5-325 MG tablet Take 2 tablets by mouth every 6 (six) hours as needed. 16 tablet 0  . pantoprazole (PROTONIX) 40 MG tablet TAKE 1 TABLET BY MOUTH EVERY DAY 90 tablet 1  . Vitamin D, Ergocalciferol, (DRISDOL) 1.25 MG (50000 UNIT) CAPS capsule Take 50,000 Units by mouth every Monday.     . zolpidem (AMBIEN) 10 MG tablet Take 10 mg by mouth at bedtime as needed for sleep.    Marland Kitchen albuterol (PROVENTIL) (2.5 MG/3ML) 0.083% nebulizer solution Take 3 mLs (2.5 mg total) by nebulization every 6 (six) hours as needed for wheezing or shortness of breath. 75 mL 2  . ipratropium-albuterol (DUONEB) 0.5-2.5 (3) MG/3ML SOLN  Take 3 mLs by nebulization every 6 (six) hours as needed. 360 mL 3  . oseltamivir (TAMIFLU) 75 MG capsule Take 1 capsule (75 mg total) by mouth 2 (two) times daily. 10 capsule 0  . predniSONE (DELTASONE) 10 MG tablet Take 4 tabs po daily x 3 days; then 3 tabs daily x3 days; then 2 tabs daily x3 days; then 1 tab daily x 3 days; then stop 30 tablet 0   Facility-Administered Medications Prior to Visit  Medication Dose Route Frequency Provider Last Rate Last Admin  . Benralizumab SOSY 30 mg  30 mg Subcutaneous Q8 Toney Reil, MD   30 mg at 11/07/20 1520   Review of Systems  Review of Systems  Constitutional: Negative.   HENT: Negative.   Respiratory: Positive for cough.        Dyspnea   Cardiovascular: Negative.    Physical Exam  BP (!) 150/78   Pulse 98   Ht  5' 8"  (1.727 m)   Wt 289 lb (131.1 kg)   SpO2 98%   BMI 43.94 kg/m  Physical Exam Constitutional:      General: She is not in acute distress.    Appearance: She is obese. She is not ill-appearing.     Comments: Appears well   Cardiovascular:     Rate and Rhythm: Normal rate and regular rhythm.  Pulmonary:     Effort: Pulmonary effort is normal. No respiratory distress.     Breath sounds: Rhonchi present.     Comments: Very scant wheezing, much improved Musculoskeletal:        General: Normal range of motion.  Skin:    General: Skin is warm and dry.  Neurological:     General: No focal deficit present.     Mental Status: She is alert and oriented to person, place, and time. Mental status is at baseline.  Psychiatric:        Mood and Affect: Mood normal.        Behavior: Behavior normal.        Thought Content: Thought content normal.        Judgment: Judgment normal.      Lab Results:  CBC    Component Value Date/Time   WBC 6.9 09/30/2020 2323   RBC 3.89 09/30/2020 2323   HGB 10.2 (L) 09/30/2020 2323   HGB 10.4 (L) 02/13/2020 1554   HCT 31.7 (L) 09/30/2020 2323   HCT 34.0 02/13/2020 1554    PLT 275 09/30/2020 2323   PLT 316 02/13/2020 1554   MCV 81.5 09/30/2020 2323   MCV 83 02/13/2020 1554   MCH 26.2 09/30/2020 2323   MCHC 32.2 09/30/2020 2323   RDW 15.7 (H) 09/30/2020 2323   RDW 14.0 02/13/2020 1554   LYMPHSABS 0.8 09/30/2020 2323   LYMPHSABS 2.3 02/13/2020 1554   MONOABS 0.5 09/30/2020 2323   EOSABS 0.0 09/30/2020 2323   EOSABS 0.0 02/13/2020 1554   BASOSABS 0.0 09/30/2020 2323   BASOSABS 0.0 02/13/2020 1554    BMET    Component Value Date/Time   NA 137 09/30/2020 2323   NA 142 06/26/2020 1306   K 3.0 (L) 09/30/2020 2323   CL 103 09/30/2020 2323   CO2 26 09/30/2020 2323   GLUCOSE 104 (H) 09/30/2020 2323   BUN 9 09/30/2020 2323   BUN 16 06/26/2020 1306   CREATININE 0.62 09/30/2020 2323   CALCIUM 9.2 09/30/2020 2323   GFRNONAA >60 09/30/2020 2323   GFRAA 90 06/26/2020 1306    BNP    Component Value Date/Time   BNP 51.6 11/27/2019 0413    ProBNP No results found for: PROBNP  Imaging: DG Chest 2 View  Result Date: 10/10/2020 CLINICAL DATA:  Bronchitis and influenza EXAM: CHEST - 2 VIEW COMPARISON:  September 30, 2020 FINDINGS: Interstitium is slightly prominent. No edema or airspace opacity. Heart is upper normal in size with pulmonary vascularity normal. No adenopathy. No bone lesions. IMPRESSION: Mild interstitial prominence, likely indicating underlying bronchitis. No edema or consolidation. Heart is normal in size. No adenopathy. Electronically Signed   By: Lowella Grip III M.D.   On: 10/10/2020 12:26   MR Lumbar Spine W Wo Contrast  Result Date: 10/14/2020 CLINICAL DATA:  Low back pain EXAM: MRI LUMBAR SPINE WITHOUT AND WITH CONTRAST TECHNIQUE: Multiplanar and multiecho pulse sequences of the lumbar spine were obtained without and with intravenous contrast. CONTRAST:  58m MULTIHANCE GADOBENATE DIMEGLUMINE 529 MG/ML IV SOLN COMPARISON:  None. FINDINGS:  Segmentation:  Standard. Alignment:  Trace anterolisthesis at L4-L5 and L5-S1. Vertebrae:  Vertebral body heights are maintained. There is no substantial marrow edema. No suspicious osseous lesion. Conus medullaris and cauda equina: Conus extends to the T12-L1 level. Conus and cauda equina appear normal. Paraspinal and other soft tissues: Unremarkable. 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 L1-L2: Prominent epidural fat mildly flattening the thecal sac. No foraminal stenosis. L2-L3: Small left foraminal protrusion. Mild facet arthropathy. Prominent dorsal epidural fat mildly flattening the thecal sac. No right foraminal stenosis. Minor left foraminal stenosis. L3-L4: Disc bulge. Marked right and mild left facet arthropathy with ligamentum flavum infolding. Moderate canal stenosis. Narrowing of the subarticular recesses. Mild foraminal stenosis. L4-L5: Disc bulge. Marked facet arthropathy with prominent hypertrophic changes and ligamentum flavum infolding. Marked canal stenosis. Narrowing of the subarticular recesses. Mild right foraminal stenosis. No left foraminal stenosis. L5-S1: Disc bulge. Marked facet arthropathy with prominent hypertrophic changes and ligamentum flavum infolding. Minor canal stenosis. Mild foraminal stenosis. IMPRESSION: Multilevel degenerative changes as detailed above. Canal stenosis is greatest L4-L5. There is marked lower lumbar facet arthropathy. Electronically Signed   By: Macy Mis M.D.   On: 10/14/2020 10:29     Assessment & Plan:   Acute asthma exacerbation - Almost fully resolved. She was dx with influenza end of April 2022 and was treated with Tamiflu, depo-medrol and prednisone taper. She is doing much better, experiencing less dyspnea and wheezing. She still has a productive cough with occasional purulent mucus. CXR 10/10/20 showed mild interstitial prominence reflecting underlying bronchitis. She resume Trelegy 256mg daily and continues to take Mucinex and use flutter valve. No additional steriods or abx required at this time. She has  upcoming CT chest to monitor nodular lung opacities. FU in 4-6 weeks with Dr. OAnder Sladeto review imaging.    EMartyn Ehrich NP 11/08/2020

## 2020-11-08 NOTE — Assessment & Plan Note (Addendum)
-   Almost fully resolved. She was dx with influenza end of April 2022 and was treated with Tamiflu, depo-medrol and prednisone taper. She is doing much better, experiencing less dyspnea and wheezing. She still has a productive cough with occasional purulent mucus. CXR 10/10/20 showed mild interstitial prominence reflecting underlying bronchitis. She resume Trelegy 296mg daily and continues to take Mucinex and use flutter valve. No additional steriods or abx required at this time. She has upcoming CT chest to monitor nodular lung opacities. FU in 4-6 weeks with Dr. OAnder Sladeto review imaging.

## 2020-11-08 NOTE — Patient Instructions (Addendum)
Continue Trelegy 200- take one puff daily in am (rinse mouth after use) Use Duoneb every 6 hours as needed for shortness of breath/wheezing (try to taper down to twice a day) Continue mucinex 683m twice a day for additional 1-2 weeks Use flutter valve two-three times a day  CT chest scheduled for June 3rd  [facility]   Follow-up: Any time in July with Dr. OAnder Sladeto review CT chest results

## 2020-11-11 ENCOUNTER — Emergency Department (HOSPITAL_COMMUNITY): Payer: Medicare Other

## 2020-11-11 ENCOUNTER — Inpatient Hospital Stay (HOSPITAL_COMMUNITY): Payer: Medicare Other

## 2020-11-11 ENCOUNTER — Other Ambulatory Visit: Payer: Self-pay

## 2020-11-11 ENCOUNTER — Encounter (HOSPITAL_COMMUNITY): Payer: Self-pay | Admitting: Emergency Medicine

## 2020-11-11 ENCOUNTER — Inpatient Hospital Stay (HOSPITAL_COMMUNITY)
Admission: EM | Admit: 2020-11-11 | Discharge: 2020-11-18 | DRG: 202 | Disposition: A | Discharge status: Home / Self Care | Payer: Medicare Other | Attending: Internal Medicine | Admitting: Internal Medicine

## 2020-11-11 DIAGNOSIS — Z8249 Family history of ischemic heart disease and other diseases of the circulatory system: Secondary | ICD-10-CM

## 2020-11-11 DIAGNOSIS — J4541 Moderate persistent asthma with (acute) exacerbation: Secondary | ICD-10-CM | POA: Diagnosis not present

## 2020-11-11 DIAGNOSIS — K219 Gastro-esophageal reflux disease without esophagitis: Secondary | ICD-10-CM | POA: Diagnosis present

## 2020-11-11 DIAGNOSIS — I10 Essential (primary) hypertension: Secondary | ICD-10-CM | POA: Diagnosis present

## 2020-11-11 DIAGNOSIS — D72829 Elevated white blood cell count, unspecified: Secondary | ICD-10-CM | POA: Diagnosis present

## 2020-11-11 DIAGNOSIS — Z79899 Other long term (current) drug therapy: Secondary | ICD-10-CM

## 2020-11-11 DIAGNOSIS — J4551 Severe persistent asthma with (acute) exacerbation: Secondary | ICD-10-CM | POA: Diagnosis not present

## 2020-11-11 DIAGNOSIS — G4733 Obstructive sleep apnea (adult) (pediatric): Secondary | ICD-10-CM | POA: Diagnosis present

## 2020-11-11 DIAGNOSIS — Z91018 Allergy to other foods: Secondary | ICD-10-CM

## 2020-11-11 DIAGNOSIS — B9781 Human metapneumovirus as the cause of diseases classified elsewhere: Secondary | ICD-10-CM | POA: Diagnosis present

## 2020-11-11 DIAGNOSIS — R0602 Shortness of breath: Secondary | ICD-10-CM | POA: Diagnosis not present

## 2020-11-11 DIAGNOSIS — F419 Anxiety disorder, unspecified: Secondary | ICD-10-CM | POA: Diagnosis present

## 2020-11-11 DIAGNOSIS — Z7951 Long term (current) use of inhaled steroids: Secondary | ICD-10-CM

## 2020-11-11 DIAGNOSIS — D638 Anemia in other chronic diseases classified elsewhere: Secondary | ICD-10-CM | POA: Diagnosis present

## 2020-11-11 DIAGNOSIS — M940 Chondrocostal junction syndrome [Tietze]: Secondary | ICD-10-CM | POA: Diagnosis present

## 2020-11-11 DIAGNOSIS — Z20822 Contact with and (suspected) exposure to covid-19: Secondary | ICD-10-CM | POA: Diagnosis present

## 2020-11-11 DIAGNOSIS — Z7982 Long term (current) use of aspirin: Secondary | ICD-10-CM

## 2020-11-11 DIAGNOSIS — J45901 Unspecified asthma with (acute) exacerbation: Secondary | ICD-10-CM

## 2020-11-11 DIAGNOSIS — Z9884 Bariatric surgery status: Secondary | ICD-10-CM

## 2020-11-11 DIAGNOSIS — Z6841 Body Mass Index (BMI) 40.0 and over, adult: Secondary | ICD-10-CM

## 2020-11-11 DIAGNOSIS — Z87891 Personal history of nicotine dependence: Secondary | ICD-10-CM

## 2020-11-11 DIAGNOSIS — E785 Hyperlipidemia, unspecified: Secondary | ICD-10-CM | POA: Diagnosis present

## 2020-11-11 DIAGNOSIS — J449 Chronic obstructive pulmonary disease, unspecified: Secondary | ICD-10-CM | POA: Diagnosis present

## 2020-11-11 LAB — CBC
HCT: 31.6 % — ABNORMAL LOW (ref 36.0–46.0)
Hemoglobin: 9.8 g/dL — ABNORMAL LOW (ref 12.0–15.0)
MCH: 26.3 pg (ref 26.0–34.0)
MCHC: 31 g/dL (ref 30.0–36.0)
MCV: 84.7 fL (ref 80.0–100.0)
Platelets: 327 10*3/uL (ref 150–400)
RBC: 3.73 MIL/uL — ABNORMAL LOW (ref 3.87–5.11)
RDW: 16.9 % — ABNORMAL HIGH (ref 11.5–15.5)
WBC: 10 10*3/uL (ref 4.0–10.5)
nRBC: 0 % (ref 0.0–0.2)

## 2020-11-11 LAB — RESP PANEL BY RT-PCR (FLU A&B, COVID) ARPGX2
Influenza A by PCR: NEGATIVE
Influenza B by PCR: NEGATIVE
SARS Coronavirus 2 by RT PCR: NEGATIVE

## 2020-11-11 LAB — CBC WITH DIFFERENTIAL/PLATELET
Abs Immature Granulocytes: 0.1 10*3/uL — ABNORMAL HIGH (ref 0.00–0.07)
Basophils Absolute: 0 10*3/uL (ref 0.0–0.1)
Basophils Relative: 0 %
Eosinophils Absolute: 0 10*3/uL (ref 0.0–0.5)
Eosinophils Relative: 0 %
HCT: 30.2 % — ABNORMAL LOW (ref 36.0–46.0)
Hemoglobin: 9.3 g/dL — ABNORMAL LOW (ref 12.0–15.0)
Immature Granulocytes: 1 %
Lymphocytes Relative: 17 %
Lymphs Abs: 2 10*3/uL (ref 0.7–4.0)
MCH: 26.5 pg (ref 26.0–34.0)
MCHC: 30.8 g/dL (ref 30.0–36.0)
MCV: 86 fL (ref 80.0–100.0)
Monocytes Absolute: 0.5 10*3/uL (ref 0.1–1.0)
Monocytes Relative: 4 %
Neutro Abs: 9.2 10*3/uL — ABNORMAL HIGH (ref 1.7–7.7)
Neutrophils Relative %: 78 %
Platelets: 292 10*3/uL (ref 150–400)
RBC: 3.51 MIL/uL — ABNORMAL LOW (ref 3.87–5.11)
RDW: 16.9 % — ABNORMAL HIGH (ref 11.5–15.5)
WBC: 11.8 10*3/uL — ABNORMAL HIGH (ref 4.0–10.5)
nRBC: 0 % (ref 0.0–0.2)

## 2020-11-11 LAB — CREATININE, SERUM
Creatinine, Ser: 0.65 mg/dL (ref 0.44–1.00)
GFR, Estimated: >60 mL/min (ref >60)

## 2020-11-11 LAB — IRON AND TIBC
Iron: 83 ug/dL (ref 28–170)
Saturation Ratios: 23 % (ref 10.4–31.8)
TIBC: 367 ug/dL (ref 250–450)
UIBC: 284 ug/dL

## 2020-11-11 LAB — BASIC METABOLIC PANEL
Anion gap: 8 (ref 5–15)
BUN: 18 mg/dL (ref 8–23)
CO2: 23 mmol/L (ref 22–32)
Calcium: 9.1 mg/dL (ref 8.9–10.3)
Chloride: 108 mmol/L (ref 98–111)
Creatinine, Ser: 0.64 mg/dL (ref 0.44–1.00)
GFR, Estimated: >60 mL/min (ref >60)
Glucose, Bld: 109 mg/dL — ABNORMAL HIGH (ref 70–99)
Potassium: 3.7 mmol/L (ref 3.5–5.1)
Sodium: 139 mmol/L (ref 135–145)

## 2020-11-11 LAB — HIV ANTIBODY (ROUTINE TESTING W REFLEX): HIV Screen 4th Generation wRfx: NONREACTIVE

## 2020-11-11 MED ORDER — LOSARTAN POTASSIUM-HCTZ 100-25 MG PO TABS: 1.0000 | ORAL_TABLET | Freq: Every day | ORAL | Status: DC

## 2020-11-11 MED ORDER — IBUPROFEN 200 MG PO TABS
600.0000 mg | ORAL_TABLET | Freq: Three times a day (TID) | ORAL | Status: DC
  Administered 2020-11-11 – 2020-11-13 (×5): 600 mg via ORAL
  Filled 2020-11-11 (×5): qty 3

## 2020-11-11 MED ORDER — GUAIFENESIN ER 600 MG PO TB12
600.0000 mg | ORAL_TABLET | Freq: Two times a day (BID) | ORAL | Status: DC
  Administered 2020-11-11 – 2020-11-14 (×6): 600 mg via ORAL
  Filled 2020-11-11 (×6): qty 1

## 2020-11-11 MED ORDER — FENTANYL CITRATE (PF) 100 MCG/2ML IJ SOLN
25.0000 ug | INTRAMUSCULAR | Status: DC | PRN
Start: 2020-11-11 — End: 2020-11-11

## 2020-11-11 MED ORDER — BUMETANIDE 1 MG PO TABS
2.0000 mg | ORAL_TABLET | Freq: Every day | ORAL | Status: DC
  Administered 2020-11-11 – 2020-11-17 (×7): 2 mg via ORAL
  Filled 2020-11-11 (×8): qty 2

## 2020-11-11 MED ORDER — LORATADINE 10 MG PO TABS
10.0000 mg | ORAL_TABLET | Freq: Every evening | ORAL | Status: DC
  Administered 2020-11-11 – 2020-11-17 (×7): 10 mg via ORAL
  Filled 2020-11-11 (×7): qty 1

## 2020-11-11 MED ORDER — LOSARTAN POTASSIUM 50 MG PO TABS
100.0000 mg | ORAL_TABLET | Freq: Every day | ORAL | Status: DC
  Administered 2020-11-11 – 2020-11-18 (×8): 100 mg via ORAL
  Filled 2020-11-11 (×4): qty 2
  Filled 2020-11-11: qty 4
  Filled 2020-11-11 (×3): qty 2

## 2020-11-11 MED ORDER — ACETAMINOPHEN 650 MG RE SUPP: 650.0000 mg | Freq: Four times a day (QID) | RECTAL | Status: DC | PRN

## 2020-11-11 MED ORDER — MONTELUKAST SODIUM 10 MG PO TABS
10.0000 mg | ORAL_TABLET | Freq: Every day | ORAL | Status: DC
  Administered 2020-11-11 – 2020-11-17 (×7): 10 mg via ORAL
  Filled 2020-11-11 (×7): qty 1

## 2020-11-11 MED ORDER — REVEFENACIN 175 MCG/3ML IN SOLN
175.0000 ug | Freq: Every day | RESPIRATORY_TRACT | Status: DC
  Administered 2020-11-12 – 2020-11-18 (×6): 175 ug via RESPIRATORY_TRACT
  Filled 2020-11-11 (×7): qty 3

## 2020-11-11 MED ORDER — AZITHROMYCIN 250 MG PO TABS
500.0000 mg | ORAL_TABLET | Freq: Every day | ORAL | Status: DC
  Administered 2020-11-12 – 2020-11-13 (×2): 500 mg via ORAL
  Filled 2020-11-11 (×2): qty 2

## 2020-11-11 MED ORDER — ENOXAPARIN SODIUM 40 MG/0.4ML IJ SOSY: 40.0000 mg | PREFILLED_SYRINGE | INTRAMUSCULAR | Status: DC

## 2020-11-11 MED ORDER — ASPIRIN EC 81 MG PO TBEC
81.0000 mg | DELAYED_RELEASE_TABLET | Freq: Every day | ORAL | Status: DC
  Administered 2020-11-11 – 2020-11-18 (×8): 81 mg via ORAL
  Filled 2020-11-11 (×8): qty 1

## 2020-11-11 MED ORDER — ACETAMINOPHEN 325 MG PO TABS
650.0000 mg | ORAL_TABLET | Freq: Four times a day (QID) | ORAL | Status: DC | PRN
  Administered 2020-11-11: 650 mg via ORAL
  Filled 2020-11-11: qty 2

## 2020-11-11 MED ORDER — ALBUTEROL (5 MG/ML) CONTINUOUS INHALATION SOLN
10.0000 mg/h | INHALATION_SOLUTION | RESPIRATORY_TRACT | Status: DC
  Administered 2020-11-11: 10 mg/h via RESPIRATORY_TRACT
  Filled 2020-11-11: qty 20

## 2020-11-11 MED ORDER — ENOXAPARIN SODIUM 60 MG/0.6ML IJ SOSY
60.0000 mg | PREFILLED_SYRINGE | INTRAMUSCULAR | Status: DC
  Administered 2020-11-11 – 2020-11-17 (×7): 60 mg via SUBCUTANEOUS
  Filled 2020-11-11 (×7): qty 0.6

## 2020-11-11 MED ORDER — POLYETHYLENE GLYCOL 3350 17 G PO PACK: 17.0000 g | PACK | Freq: Every day | ORAL | Status: DC

## 2020-11-11 MED ORDER — AMLODIPINE BESYLATE 5 MG PO TABS: 5.0000 mg | ORAL_TABLET | Freq: Every day | ORAL | Status: DC

## 2020-11-11 MED ORDER — IPRATROPIUM-ALBUTEROL 0.5-2.5 (3) MG/3ML IN SOLN
3.0000 mL | RESPIRATORY_TRACT | Status: AC
  Administered 2020-11-11 (×3): 3 mL via RESPIRATORY_TRACT
  Filled 2020-11-11 (×3): qty 3

## 2020-11-11 MED ORDER — ARFORMOTEROL TARTRATE 15 MCG/2ML IN NEBU
15.0000 ug | INHALATION_SOLUTION | Freq: Two times a day (BID) | RESPIRATORY_TRACT | Status: DC
  Administered 2020-11-11 – 2020-11-18 (×14): 15 ug via RESPIRATORY_TRACT
  Filled 2020-11-11 (×16): qty 2

## 2020-11-11 MED ORDER — PANTOPRAZOLE SODIUM 40 MG PO TBEC
40.0000 mg | DELAYED_RELEASE_TABLET | Freq: Every day | ORAL | Status: DC
  Administered 2020-11-11 – 2020-11-18 (×8): 40 mg via ORAL
  Filled 2020-11-11 (×8): qty 1

## 2020-11-11 MED ORDER — HYDROCHLOROTHIAZIDE 25 MG PO TABS
25.0000 mg | ORAL_TABLET | Freq: Every day | ORAL | Status: DC
  Administered 2020-11-11 – 2020-11-18 (×8): 25 mg via ORAL
  Filled 2020-11-11 (×8): qty 1

## 2020-11-11 MED ORDER — SODIUM CHLORIDE 0.9 % IV SOLN
500.0000 mg | INTRAVENOUS | Status: AC
  Administered 2020-11-11: 500 mg via INTRAVENOUS
  Filled 2020-11-11: qty 500

## 2020-11-11 MED ORDER — METHYLPREDNISOLONE SODIUM SUCC 125 MG IJ SOLR
125.0000 mg | Freq: Once | INTRAMUSCULAR | Status: AC
  Administered 2020-11-11: 125 mg via INTRAVENOUS
  Filled 2020-11-11: qty 2

## 2020-11-11 MED ORDER — AMLODIPINE BESYLATE 5 MG PO TABS
5.0000 mg | ORAL_TABLET | Freq: Every day | ORAL | Status: DC
  Administered 2020-11-11 – 2020-11-18 (×8): 5 mg via ORAL
  Filled 2020-11-11 (×8): qty 1

## 2020-11-11 MED ORDER — METHYLPREDNISOLONE SODIUM SUCC 40 MG IJ SOLR
40.0000 mg | Freq: Two times a day (BID) | INTRAMUSCULAR | Status: DC
  Administered 2020-11-11 – 2020-11-13 (×5): 40 mg via INTRAVENOUS
  Filled 2020-11-11 (×5): qty 1

## 2020-11-11 MED ORDER — MAGNESIUM SULFATE 2 GM/50ML IV SOLN
2.0000 g | Freq: Once | INTRAVENOUS | Status: AC
  Administered 2020-11-11: 2 g via INTRAVENOUS
  Filled 2020-11-11: qty 50

## 2020-11-11 MED ORDER — ONDANSETRON HCL 4 MG/2ML IJ SOLN: 4.0000 mg | Freq: Four times a day (QID) | INTRAMUSCULAR | Status: DC | PRN

## 2020-11-11 MED ORDER — CYCLOBENZAPRINE HCL 10 MG PO TABS
10.0000 mg | ORAL_TABLET | Freq: Every day | ORAL | Status: DC
  Administered 2020-11-11 – 2020-11-18 (×8): 10 mg via ORAL
  Filled 2020-11-11 (×8): qty 1

## 2020-11-11 MED ORDER — DOCUSATE SODIUM 50 MG/5ML PO LIQD: 100.0000 mg | Freq: Two times a day (BID) | ORAL | Status: DC

## 2020-11-11 MED ORDER — FENTANYL CITRATE (PF) 100 MCG/2ML IJ SOLN: 25.0000 ug | INTRAMUSCULAR | Status: DC | PRN

## 2020-11-11 MED ORDER — ATORVASTATIN CALCIUM 10 MG PO TABS
10.0000 mg | ORAL_TABLET | Freq: Every day | ORAL | Status: DC
  Administered 2020-11-11 – 2020-11-18 (×8): 10 mg via ORAL
  Filled 2020-11-11 (×8): qty 1

## 2020-11-11 MED ORDER — ONDANSETRON HCL 4 MG PO TABS: 4.0000 mg | ORAL_TABLET | Freq: Four times a day (QID) | ORAL | Status: DC | PRN

## 2020-11-11 MED ORDER — ZOLPIDEM TARTRATE 5 MG PO TABS
5.0000 mg | ORAL_TABLET | Freq: Every day | ORAL | Status: DC
  Administered 2020-11-11 – 2020-11-17 (×7): 5 mg via ORAL
  Filled 2020-11-11 (×7): qty 1

## 2020-11-11 NOTE — ED Provider Notes (Signed)
Patty Howard Provider Note   CSN: [id number] Arrival date & time: 11/11/20  0330     History Chief Complaint  Patient presents with  . Asthma    Patty Howard is a 65 y.o. female.  65 yo F with a chief complaints of shortness of breath.  She has had issues with this off and on for about a month or so after getting the flu.  She has been off and on steroids with improvement on steroids and then immediately off as recurrence.  Having significant cough.  Has had an increased usage of her duo nebs at home.  She saw her pulmonologist a couple days ago and per the note she was doing better though the patient states that she has had continued worsening.  Took multiple DuoNeb's tonight without improvement and so came to the ED for evaluation.  Denies fevers.  The history is provided by the patient.  Asthma Associated symptoms include shortness of breath. Pertinent negatives include no chest pain and no headaches.  Illness Severity:  Moderate Onset quality:  Gradual Duration:  3 days Timing:  Constant Progression:  Worsening Chronicity:  New Associated symptoms: cough, shortness of breath and wheezing   Associated symptoms: no chest pain, no congestion, no fever, no headaches, no myalgias, no nausea, no rhinorrhea and no vomiting        Past Medical History:  Diagnosis Date  . Anxiety   . Arthritis   . Asthma   . COPD (chronic obstructive pulmonary disease) ([place])   . Fracture    closed displaced left radial head  . Hypertension   . Sleep apnea    does not wear CPAP    Patient Active Problem List   Diagnosis Date Noted  . Menopause 11/08/2020  . Influenza 10/10/2020  . Medication management 04/30/2020  . Gastroesophageal reflux disease 03/14/2020  . OSA (obstructive sleep apnea) 02/15/2020  . Abnormal findings on diagnostic imaging of lung 01/08/2020  . Educated about COVID-19 virus infection 11/30/2019  . Aortic atherosclerosis  ([place]) 11/30/2019  . Chest pain 11/26/2019  . Acute asthma exacerbation 05/22/2019  . Left radial head fracture 02/03/2019  . Closed fracture of radial head 01/29/2019  . Prediabetes 10/19/2018  . 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) ([place]) 10/19/2018  . Insomnia 10/19/2018  . Elbow pain, chronic, left 10/19/2018  . Dyspnea 10/12/2017  . Pancolitis ([place]) 05/20/2017  . H/O Spinal surgery 01/18/2015  . Essential hypertension 01/18/2015  . Obesity 01/18/2015    Past Surgical History:  Procedure Laterality Date  . BACK SURGERY     spinal stimulator  . BREAST BIOPSY Right 2017   benign  . COLONOSCOPY W/ BIOPSIES AND POLYPECTOMY    . DILATION AND CURETTAGE OF UTERUS    . LAPAROSCOPIC ROUX-EN-Y GASTRIC BYPASS WITH UPPER ENDOSCOPY AND REMOVAL OF LAP BAND    . RADIAL HEAD ARTHROPLASTY Left 02/03/2019   Procedure: LEFT RADIAL HEAD ARTHROPLASTY;  Surgeon: [name], MD;  Location: [facility];  Service: Orthopedics;  Laterality: Left;  AXILLARY BLOCK VS BIER BLOCK  . TUBAL LIGATION       OB History   No obstetric history on file.     Family History  Problem Relation Age of Onset  . Hypertension Mother   . Stroke Mother   . Cancer Father     Social History   Tobacco Use  . Smoking status: Former Smoker    Packs/day: 0.50    Years: 10.00  Pack years: 5.00    Types: Cigarettes    Quit date: 06/15/1998    Years since quitting: 22.4  . Smokeless tobacco: Never Used  Vaping Use  . Vaping Use: Never used  Substance Use Topics  . Alcohol use: Never  . Drug use: Never    Home Medications Prior to Admission medications   Medication Sig Start Date End Date Taking? Authorizing Provider  albuterol (VENTOLIN HFA) 108 (90 Base) MCG/ACT inhaler Inhale 1-2 puffs into the lungs every 6 (six) hours as needed for wheezing or shortness of breath. 10/24/20   Martyn Ehrich, NP  amLODipine (NORVASC) 5 MG tablet TAKE 1 TABLET (5 MG TOTAL) BY MOUTH DAILY. 09/24/20 12/23/20   Minus Breeding, MD  ascorbic acid (VITAMIN C) 500 MG tablet Take 500 mg by mouth daily.    [provider]  aspirin EC 81 MG tablet Take 81 mg by mouth daily. Swallow whole.    [provider]  atorvastatin (LIPITOR) 10 MG tablet Take 10 mg by mouth daily.    [provider]  bumetanide (BUMEX) 2 MG tablet Take 2 mg by mouth daily. 03/05/20   [provider]  Calcium Carbonate (CALCIUM 600 PO) Take 1 tablet by mouth daily.    [provider]  cyclobenzaprine (FLEXERIL) 10 MG tablet Take 10 mg by mouth 2 (two) times daily as needed for muscle spasms.  08/09/19   [provider]  Fluticasone-Umeclidin-Vilant (TRELEGY ELLIPTA) 200-62.5-25 MCG/INH AEPB Inhale 1 puff into the lungs daily. 10/24/20   Martyn Ehrich, NP  furosemide (LASIX) 40 MG tablet Take 1 tablet (40 mg total) by mouth daily as needed for fluid or edema (WEIGHT GAIN OF 2LBS, SHORTNESS OF BREATH). 06/10/20 09/08/20  Minus Breeding, MD  HYDROcodone-acetaminophen (NORCO/VICODIN) 5-325 MG tablet Take 1 tablet by mouth every 6 (six) hours as needed. 01/15/20   Drenda Freeze, MD  ibuprofen (ADVIL) 800 MG tablet Take 1 tablet (800 mg total) by mouth every 8 (eight) hours as needed for mild pain. 06/07/20   Ward, Delice Bison, DO  ipratropium-albuterol (DUONEB) 0.5-2.5 (3) MG/3ML SOLN Take 3 mLs by nebulization every 6 (six) hours as needed. 11/08/20   Martyn Ehrich, NP  levocetirizine (XYZAL) 5 MG tablet Take 1 tablet (5 mg total) by mouth every evening. 07/23/20   Valentina Shaggy, MD  losartan-hydrochlorothiazide (HYZAAR) 100-25 MG tablet Take 1 tablet by mouth daily. 05/24/20   Minus Breeding, MD  montelukast (SINGULAIR) 10 MG tablet Take 1 tablet (10 mg total) by mouth at bedtime. 07/23/20   Valentina Shaggy, MD  Multiple Vitamin (MULTIVITAMIN WITH MINERALS) TABS tablet Take 1 tablet by mouth daily. Centrum Multivitamin    [provider]  ondansetron (ZOFRAN ODT)  4 MG disintegrating tablet Take 1 tablet (4 mg total) by mouth every 6 (six) hours as needed. 06/07/20   Ward, Delice Bison, DO  oxyCODONE-acetaminophen (PERCOCET/ROXICET) 5-325 MG tablet Take 2 tablets by mouth every 6 (six) hours as needed. 06/07/20   Ward, Delice Bison, DO  pantoprazole (PROTONIX) 40 MG tablet TAKE 1 TABLET BY MOUTH EVERY DAY 06/17/20   Sherrilyn Rist A, MD  Vitamin D, Ergocalciferol, (DRISDOL) 1.25 MG (50000 UNIT) CAPS capsule Take 50,000 Units by mouth every Monday.  10/27/19   [provider]  zolpidem (AMBIEN) 10 MG tablet Take 10 mg by mouth at bedtime as needed for sleep.    [provider]    Allergies    Banana  Review of  Systems   Review of Systems  Constitutional: Negative for chills and fever.  HENT: Negative for congestion and rhinorrhea.   Eyes: Negative for redness and visual disturbance.  Respiratory: Positive for cough, shortness of breath and wheezing.   Cardiovascular: Negative for chest pain and palpitations.  Gastrointestinal: Negative for nausea and vomiting.  Genitourinary: Negative for dysuria and urgency.  Musculoskeletal: Negative for arthralgias and myalgias.  Skin: Negative for pallor and wound.  Neurological: Negative for dizziness and headaches.    Physical Exam Updated Vital Signs BP 140/73   Pulse 89   Temp 98.4 F (36.9 C) (Oral)   Resp 19   Ht 5' 8"  (1.727 m)   Wt 131.5 kg   SpO2 99%   BMI 44.09 kg/m   Physical Exam Vitals and nursing note reviewed.  Constitutional:      General: She is not in acute distress.    Appearance: She is well-developed. She is not diaphoretic.     Comments: BMI 44  HENT:     Head: Normocephalic and atraumatic.  Eyes:     Pupils: Pupils are equal, round, and reactive to light.  Cardiovascular:     Rate and Rhythm: Normal rate and regular rhythm.     Heart sounds: No murmur heard. No friction rub. No gallop.   Pulmonary:     Effort: Pulmonary effort is normal.     Breath  sounds: Wheezing present. No rales.     Comments: Diffuse wheezes and prolonged expiratory efforts in all lung fields.  Tachypnea. Abdominal:     General: There is no distension.     Palpations: Abdomen is soft.     Tenderness: There is no abdominal tenderness.  Musculoskeletal:        General: No tenderness.     Cervical back: Normal range of motion and neck supple.     Right lower leg: No edema.     Left lower leg: No edema.  Skin:    General: Skin is warm and dry.  Neurological:     Mental Status: She is alert and oriented to person, place, and time.  Psychiatric:        Behavior: Behavior normal.     ED Results / Procedures / Treatments   Labs (all labs ordered are listed, but only abnormal results are displayed) Labs Reviewed  CBC WITH DIFFERENTIAL/PLATELET - Abnormal; Notable for the following components:      Result Value   WBC 11.8 (*)    RBC 3.51 (*)    Hemoglobin 9.3 (*)    HCT 30.2 (*)    RDW 16.9 (*)    Neutro Abs 9.2 (*)    Abs Immature Granulocytes 0.10 (*)    All other components within normal limits  BASIC METABOLIC PANEL - Abnormal; Notable for the following components:   Glucose, Bld 109 (*)    All other components within normal limits  RESP PANEL BY RT-PCR (FLU A&B, COVID) [id number]    EKG None  Radiology DG Chest Port 1 View  Result Date: 11/11/2020 CLINICAL DATA:  Cough and shortness of breath EXAM: PORTABLE CHEST 1 VIEW COMPARISON:  10/10/2020 FINDINGS: Borderline heart size accentuated by technique. Stable mediastinal contours. There is no edema, consolidation, effusion, or pneumothorax. Artifact from EKG leads. IMPRESSION: No acute or interval finding. Electronically Signed   By: Monte Fantasia M.D.   On: 11/11/2020 04:50    Procedures Procedures   Medications Ordered in ED Medications  albuterol (PROVENTIL,VENTOLIN) solution continuous neb (  has no administration in time range)  ipratropium-albuterol (DUONEB) 0.5-2.5 (3) MG/3ML nebulizer  solution 3 mL (3 mLs Nebulization Given 11/11/20 0440)  methylPREDNISolone sodium succinate (SOLU-MEDROL) 125 mg/2 mL injection 125 mg (125 mg Intravenous Given 11/11/20 0514)  magnesium sulfate IVPB 2 g 50 mL (0 g Intravenous Stopped 11/11/20 0538)    ED Course  I have reviewed the triage vital signs and the nursing notes.  Pertinent labs & imaging results that were available during my care of the patient were reviewed by me and considered in my medical decision making (see chart for details).    MDM Rules/Calculators/A&P                          65 yo F with a chief complaints of an asthma exacerbation.  Feels like her asthma.  Saw her pulmonologist a few days ago had that note it seems like they thought she was improving though the patient states that she had been doing worse at that time.  Took multiple duo nebs this evening without improvement.  On initial exam the patient is tachypneic with diffuse wheezes.  Given 3 duo nebs back-to-back with objective mild improvement though patient's not endorsing any improvement.  She was given Solu-Medrol and magnesium as well.  Continued diffuse wheezes on exam will start on a continuous neb.  Chest x-ray viewed by me without focal infiltrate.  Blood count without significant anemia.  Start on continuous, will discuss with hospitalist.   CRITICAL CARE Performed by: Cecilio Asper   Total critical care time: 35 minutes  Critical care time was exclusive of separately billable procedures and treating other patients.  Critical care was necessary to treat or prevent imminent or life-threatening deterioration.  Critical care was time spent personally by me on the following activities: development of treatment plan with patient and/or surrogate as well as nursing, discussions with consultants, evaluation of patient's response to treatment, examination of patient, obtaining history from patient or surrogate, ordering and performing treatments and  interventions, ordering and review of laboratory studies, ordering and review of radiographic studies, pulse oximetry and re-evaluation of patient's condition.  The patients results and plan were reviewed and discussed.   Any x-rays performed were independently reviewed by myself.   Differential diagnosis were considered with the presenting HPI.  Medications  albuterol (PROVENTIL,VENTOLIN) solution continuous neb (has no administration in time range)  ipratropium-albuterol (DUONEB) 0.5-2.5 (3) MG/3ML nebulizer solution 3 mL (3 mLs Nebulization Given 11/11/20 0440)  methylPREDNISolone sodium succinate (SOLU-MEDROL) 125 mg/2 mL injection 125 mg (125 mg Intravenous Given 11/11/20 0514)  magnesium sulfate IVPB 2 g 50 mL (0 g Intravenous Stopped 11/11/20 0538)    Vitals:   11/11/20 0431 11/11/20 0529 11/11/20 0530 11/11/20 0540  BP: (!) 167/93 (!) 147/71 140/73   Pulse: 73 79 89   Resp: 18 (!) 23 19   Temp:      TempSrc:      SpO2: 98% 98% 99% 99%  Weight:      Height:        Final diagnoses:  Severe persistent asthma with exacerbation    Admission/ observation were discussed with the admitting physician, patient and/or family and they are comfortable with the plan.    Final Clinical Impression(s) / ED Diagnoses Final diagnoses:  Severe persistent asthma with exacerbation    Rx / DC Orders ED Discharge Orders    None       Deno Etienne, DO  11/11/20 0604  

## 2020-11-11 NOTE — ED Triage Notes (Signed)
Patient arrives complaining of asthma exacerbation. Patient was seen by her doctor and prescribed prednisone and finished her steroids 3 days ago. Patient states shortness of breath is increasing. Patient with difficulty speaking in full sentences, tachypnea. Patient attempted x2 duonebs at home without relief.

## 2020-11-11 NOTE — H&P (Signed)
History and Physical    Patty Howard VHQ:[id number] DOB: Dec 10, 1955 DOA: 11/11/2020  PCP: Buzzy Han, MD  Patient coming from: Home  Chief Complaint: dyspnea  HPI: Patty Howard is a 65 y.o. female with medical history significant of asthma, HTN, anxiety. Presenting with dyspnea. She has been short of breath for 6 weeks. About 6 weeks ago she was treated for flu. Her symptoms have been persistent since then. She has seen her regular pulmonologist a couple of time in the interm. She has been prescribed steroids and breathing treatments. She state these has not resolved her symptoms. She now finds it difficult to walk around the house at this point. She has a non-productive cough. No fevers. No sick contacts. She came to the ED since her symptoms have still not resolved. She denies any other aggravating or alleviating factors.    ED Course: CXR was negative. COVID/flu screen was negative. She was started on nebs, steroids, and magnesium. [facility] was called for admission.   Review of Systems:  Denies CP, palpitations, N/V/D, syncopal episodes, fever. Reports left axillary pain. Review of systems is otherwise negative for all not mentioned in HPI.   PMHx Past Medical History:  Diagnosis Date  . Anxiety   . Arthritis   . Asthma   . COPD (chronic obstructive pulmonary disease) ([place])   . Fracture    closed displaced left radial head  . Hypertension   . Sleep apnea    does not wear CPAP    PSHx Past Surgical History:  Procedure Laterality Date  . BACK SURGERY     spinal stimulator  . BREAST BIOPSY Right 2017   benign  . COLONOSCOPY W/ BIOPSIES AND POLYPECTOMY    . DILATION AND CURETTAGE OF UTERUS    . LAPAROSCOPIC ROUX-EN-Y GASTRIC BYPASS WITH UPPER ENDOSCOPY AND REMOVAL OF LAP BAND    . RADIAL HEAD ARTHROPLASTY Left 02/03/2019   Procedure: LEFT RADIAL HEAD ARTHROPLASTY;  Surgeon: [name], MD;  Location: [facility];  Service: Orthopedics;  Laterality: Left;   AXILLARY BLOCK VS BIER BLOCK  . TUBAL LIGATION      Soc[facility]x  reports that she quit smoking about 22 years ago. Her smoking use included cigarettes. She has a 5.00 pack-year smoking history. She has never used smokeless tobacco. She reports that she does not drink alcohol and does not use drugs.  Allergies  Allergen Reactions  . Banana Hives and Itching    FamHx Family History  Problem Relation Age of Onset  . Hypertension Mother   . Stroke Mother   . Cancer Father     Prior to Admission medications   Medication Sig Start Date End Date Taking? Authorizing Provider  albuterol (VENTOLIN HFA) 108 (90 Base) MCG/ACT inhaler Inhale 1-2 puffs into the lungs every 6 (six) hours as needed for wheezing or shortness of breath. 10/24/20   Martyn Ehrich, NP  amLODipine (NORVASC) 5 MG tablet TAKE 1 TABLET (5 MG TOTAL) BY MOUTH DAILY. 09/24/20 12/23/20  Minus Breeding, MD  ascorbic acid (VITAMIN C) 500 MG tablet Take 500 mg by mouth daily.    [provider]  aspirin EC 81 MG tablet Take 81 mg by mouth daily. Swallow whole.    [provider]  atorvastatin (LIPITOR) 10 MG tablet Take 10 mg by mouth daily.    [provider]  bumetanide (BUMEX) 2 MG tablet Take 2 mg by mouth daily. 03/05/20   [provider]  Calcium Carbonate (CALCIUM 600 PO) Take  1 tablet by mouth daily.    [provider]  cyclobenzaprine (FLEXERIL) 10 MG tablet Take 10 mg by mouth 2 (two) times daily as needed for muscle spasms.  08/09/19   [provider]  Fluticasone-Umeclidin-Vilant (TRELEGY ELLIPTA) 200-62.5-25 MCG/INH AEPB Inhale 1 puff into the lungs daily. 10/24/20   Martyn Ehrich, NP  furosemide (LASIX) 40 MG tablet Take 1 tablet (40 mg total) by mouth daily as needed for fluid or edema (WEIGHT GAIN OF 2LBS, SHORTNESS OF BREATH). 06/10/20 09/08/20  Minus Breeding, MD  HYDROcodone-acetaminophen (NORCO/VICODIN) 5-325 MG tablet Take 1 tablet by mouth every 6 (six) hours  as needed. 01/15/20   Drenda Freeze, MD  ibuprofen (ADVIL) 800 MG tablet Take 1 tablet (800 mg total) by mouth every 8 (eight) hours as needed for mild pain. 06/07/20   Ward, Delice Bison, DO  ipratropium-albuterol (DUONEB) 0.5-2.5 (3) MG/3ML SOLN Take 3 mLs by nebulization every 6 (six) hours as needed. 11/08/20   Martyn Ehrich, NP  levocetirizine (XYZAL) 5 MG tablet Take 1 tablet (5 mg total) by mouth every evening. 07/23/20   Valentina Shaggy, MD  losartan-hydrochlorothiazide (HYZAAR) 100-25 MG tablet Take 1 tablet by mouth daily. 05/24/20   Minus Breeding, MD  montelukast (SINGULAIR) 10 MG tablet Take 1 tablet (10 mg total) by mouth at bedtime. 07/23/20   Valentina Shaggy, MD  Multiple Vitamin (MULTIVITAMIN WITH MINERALS) TABS tablet Take 1 tablet by mouth daily. Centrum Multivitamin    [provider]  ondansetron (ZOFRAN ODT) 4 MG disintegrating tablet Take 1 tablet (4 mg total) by mouth every 6 (six) hours as needed. 06/07/20   Ward, Delice Bison, DO  oxyCODONE-acetaminophen (PERCOCET/ROXICET) 5-325 MG tablet Take 2 tablets by mouth every 6 (six) hours as needed. 06/07/20   Ward, Delice Bison, DO  pantoprazole (PROTONIX) 40 MG tablet TAKE 1 TABLET BY MOUTH EVERY DAY 06/17/20   Sherrilyn Rist A, MD  Vitamin D, Ergocalciferol, (DRISDOL) 1.25 MG (50000 UNIT) CAPS capsule Take 50,000 Units by mouth every Monday.  10/27/19   [provider]  zolpidem (AMBIEN) 10 MG tablet Take 10 mg by mouth at bedtime as needed for sleep.    [provider]    Physical Exam: Vitals:   11/11/20 0626 11/11/20 0700 11/11/20 0715 11/11/20 0800  BP: (!) 142/81 (!) 168/87 (!) 176/95   Pulse: 77 75 77   Resp: 20 16 18    Temp:      TempSrc:      SpO2: 98% 97% 96% 96%  Weight:      Height:        General: 65 y.o. female resting in bed in NAD Eyes: PERRL, normal sclera ENMT: Nares patent w/o discharge, orophaynx clear, dentition normal, ears w/o discharge/lesions/ulcers Neck:  Supple, trachea midline Cardiovascular: RRR, +S1, S2, no m/g/r, equal pulses throughout Respiratory: diffuse expiratory wheeze, soft rhonchi at apices, normal WOB while on nebulizer Tx GI: BS+, obese, NDNT, no masses noted, no organomegaly noted MSK: No e/c/c Skin: No rashes, bruises, ulcerations noted Neuro: A&O x 3, no focal deficits Psyc: Appropriate interaction and affect, calm/cooperative  Labs on Admission: I have personally reviewed following labs and imaging studies  CBC: Recent Labs  Lab 11/11/20 0529  WBC 11.8*  NEUTROABS 9.2*  HGB 9.3*  HCT 30.2*  MCV 86.0  PLT 102   Basic Metabolic Panel: Recent Labs  Lab 11/11/20 0529  NA 139  K 3.7  CL 108  CO2 23  GLUCOSE 109*  BUN 18  CREATININE 0.64  CALCIUM 9.1   GFR: Estimated Creatinine Clearance: 99.3 mL/min (by C-G formula based on SCr of 0.64 mg/dL). Liver Function Tests: No results for input(s): AST, ALT, ALKPHOS, BILITOT, PROT, ALBUMIN in the last 168 hours. No results for input(s): LIPASE, AMYLASE in the last 168 hours. No results for input(s): AMMONIA in the last 168 hours. Coagulation Profile: No results for input(s): INR, PROTIME in the last 168 hours. Cardiac Enzymes: No results for input(s): CKTOTAL, CKMB, CKMBINDEX, TROPONINI in the last 168 hours. BNP (last 3 results) No results for input(s): PROBNP in the last 8760 hours. HbA1C: No results for input(s): HGBA1C in the last 72 hours. CBG: No results for input(s): GLUCAP in the last 168 hours. Lipid Profile: No results for input(s): CHOL, HDL, LDLCALC, TRIG, CHOLHDL, LDLDIRECT in the last 72 hours. Thyroid Function Tests: No results for input(s): TSH, T4TOTAL, FREET4, T3FREE, THYROIDAB in the last 72 hours. Anemia Panel: No results for input(s): VITAMINB12, FOLATE, FERRITIN, TIBC, IRON, RETICCTPCT in the last 72 hours. Urine analysis:    Component Value Date/Time   COLORURINE YELLOW 09/30/2020 0242   APPEARANCEUR CLEAR 09/30/2020 0242    LABSPEC 1.013 09/30/2020 0242   PHURINE 6.0 09/30/2020 0242   GLUCOSEU NEGATIVE 09/30/2020 0242   HGBUR NEGATIVE 09/30/2020 0242   BILIRUBINUR NEGATIVE 09/30/2020 0242   KETONESUR NEGATIVE 09/30/2020 0242   PROTEINUR NEGATIVE 09/30/2020 0242   NITRITE NEGATIVE 09/30/2020 0242   LEUKOCYTESUR NEGATIVE 09/30/2020 0242    Radiological Exams on Admission: DG Chest Port 1 View  Result Date: 11/11/2020 CLINICAL DATA:  Cough and shortness of breath EXAM: PORTABLE CHEST 1 VIEW COMPARISON:  10/10/2020 FINDINGS: Borderline heart size accentuated by technique. Stable mediastinal contours. There is no edema, consolidation, effusion, or pneumothorax. Artifact from EKG leads. IMPRESSION: No acute or interval finding. Electronically Signed   By: Monte Fantasia M.D.   On: 11/11/2020 04:50    EKG: None obtained in ED  Assessment/Plan Asthma exacerbation Acute respiratory distress     - admitted to inpt, tele     - continue steroids, nebs; add azithro and guaifenesin     - let's check a HRCT     - consult pulm, appreciate his assistance     - there is also likely an element of costochondritis here, will add a short run on higher dose ibuprofen  HTN     - resume home meds  Anxiety     - resume home meds  HLD     - resume home statin  GERD     - protonix  Morbid obesity     - counsel on diet, lifestyle changes  Normocytic anemia     - check iron studies     - no evidence of bleed  DVT prophylaxis: lovenox  Code Status: FULL  Family Communication: None at bedside  Consults called: Pulmology   Status is: Inpatient  Remains inpatient appropriate because:Inpatient level of care appropriate due to severity of illness   Dispo: The patient is from: Home              Anticipated d/c is to: Home              Patient currently is not medically stable to d/c.   Difficult to place patient No  Time spent coordinating admission: 70 minutes  [facility]  If  7PM-7AM, please contact night-coverage [email or web address]  11/11/2020, 8:07 AM

## 2020-11-11 NOTE — Consult Note (Signed)
NAME:  Patty Howard, MRN:  [id number], DOB:  11-19-1955, LOS: 0 ADMISSION DATE:  11/11/2020, CONSULTATION DATE:  11/11/20 REFERRING MD:  Cherylann Ratel, DO CHIEF COMPLAINT:  Shortness of breath  History of Present Illness:  Patty Howard 65 year old woman former smoker with history of asthma, obstructive sleep apnea, GERD, and obesity who presents to the [facility] emergency department on 11/11/20 with shortness of breath, cough and wheezing.  She has been seen in pulmonary clinic in late April and 2 other times during the month of May.  She had influenza in April and was treated with Tamiflu and prednisone.  Since then she has had ongoing cough, wheezing and shortness of breath.  Despite changes in her inhaler therapy and nebulizer treatments along with multiple steroid tapers she reports her breathing is not improved.  Prior to her influenza infection she reports she had similar symptoms that would not respond significantly to steroids, inhaler therapy or breathing treatments. She was also [facility] She does not report benefit since starting this medication.   She does report history of GERD that seems well controlled on pantoprazole 30m daily.   She is using CPAP therapy at night for OSA.   Pertinent  Medical History  Asthma OSA Hypertension  Significant Hospital Events: Including procedures, antibiotic start and stop dates in addition to other pertinent events   . 5/30 admitted  Interim History / Subjective:    Objective   Blood pressure (!) 121/103, pulse 83, temperature 98.4 F (36.9 C), temperature source Oral, resp. rate 20, height 5' 8"  (1.727 m), weight 131.5 kg, SpO2 99 %.        Intake/Output Summary (Last 24 hours) at 11/11/2020 0907 Last data filed at 11/11/2020 [id number] per 24 hour  Intake 49.89 ml  Output --  Net 49.89 ml   Filed Weights   11/11/20 0342  Weight: 131.5 kg     Examination: General: obese, mild distress, sitting up in bed HENT: [place]/AT, moist mucous membranes, sclera anicteric Lungs: scattered wheezing bilaterally,  Cardiovascular: rrr, no murmurs Abdomen: soft, non-tender, non-distended, BS+ Extremities: warm, no edema Neuro: alert, oriented, anxious, moving all extremities GU: deferred  Labs/imaging that I have personally reviewed  (right click and "Reselect all SmartList Selections" daily)   Echo 11/26/19: EF 65-70%. RV systolic function is normal. No valvular issues.  CXR 11/11/20: no consolidation or effusion. No pneumthorax.  CTA Chest 11/2019: Poor inspiratory effort with near collapse of her left and right main bronchi. Mild bibasilar atelectasis. Stable subcentimeter pulmonary nodules.    HRCT Chest 11/11/20: No acute findings noted. Scattered areas of scarring of distal airways similar to prior scans. Poor expiratory phase so difficult to compare to inspiratory phase for concern of tracheobronchomalacia. Pulmonary nodules are stable.  5/30: CBC, BMP reviewed  Autoimmune profile: negative ANA, ANCA, RF Normal immunoglobulin levels 10/31/19 (IgG, IgA, IgE, IgM) Negative aspergillus antibodies  No micro history of specific organisms in sputum  NM Stress trest normal 12/2019  Resolved Hospital Problem list     Assessment & Plan:   Asthma vs Tracheobronchomalacia She has not clinically improved over recent months of aggressive asthma therapies with benralizumab, steroid tapers and inhaler/nebulizer therapies. There is concern for tracheobronchomalacia given her clinic history along with the appearance of her airways on CTA chest 11/2019. HRCT chest today does not appear to have an expiratory series on full expiration, so I believe it is not an adequate  study to evaluate for TBM.  - ok to treat for acute exacerbation with nebulizer treatments and prednisone taper - discussed with patient the concern for TBM and that weight loss along  with CPAP therapy are her best treatment options.  - she may require referral to [facility] or [facility] for further evaluation of TBM and possible stent trial in the future - patient has follow up with Dr. Ander Slade in June. Will discuss case further with him.  GERD - continue pantoprazole - elevate head of bed when sleeping  OSA - CPAP qhs  Obesity - weight loss recommended  Labs   CBC: Recent Labs  Lab 11/11/20 0529  WBC 11.8*  NEUTROABS 9.2*  HGB 9.3*  HCT 30.2*  MCV 86.0  PLT 562    Basic Metabolic Panel: Recent Labs  Lab 11/11/20 0529  NA 139  K 3.7  CL 108  CO2 23  GLUCOSE 109*  BUN 18  CREATININE 0.64  CALCIUM 9.1   GFR: Estimated Creatinine Clearance: 99.3 mL/min (by C-G formula based on SCr of 0.64 mg/dL). Recent Labs  Lab 11/11/20 0529  WBC 11.8*    Liver Function Tests: No results for input(s): AST, ALT, ALKPHOS, BILITOT, PROT, ALBUMIN in the last 168 hours. No results for input(s): LIPASE, AMYLASE in the last 168 hours. No results for input(s): AMMONIA in the last 168 hours.  ABG No results found for: PHART, PCO2ART, PO2ART, HCO3, TCO2, ACIDBASEDEF, O2SAT   Coagulation Profile: No results for input(s): INR, PROTIME in the last 168 hours.  Cardiac Enzymes: No results for input(s): CKTOTAL, CKMB, CKMBINDEX, TROPONINI in the last 168 hours.  HbA1C: Hgb A1c MFr Bld  Date/Time Value Ref Range Status  11/26/2019 01:55 AM 6.7 (H) 4.8 - 5.6 % Final    Comment:    RESULTS CONFIRMED BY MANUAL DILUTION (NOTE) Pre diabetes:          5.7%-6.4%  Diabetes:              >6.4%  Glycemic control for   <7.0% adults with diabetes   05/23/2019 06:22 AM 6.3 (H) 4.8 - 5.6 % Final    Comment:    (NOTE) Pre diabetes:          5.7%-6.4% Diabetes:              >6.4% Glycemic control for   <7.0% adults with diabetes     CBG: No results for input(s): GLUCAP in the last 168 hours.  Review of Systems:   Review of Systems  Constitutional: Negative for chills,  fever, malaise/fatigue and weight loss.  HENT: Negative for congestion, sinus pain and sore throat.   Eyes: Negative.   Respiratory: Positive for cough, sputum production, shortness of breath and wheezing. Negative for hemoptysis.   Cardiovascular: Negative for chest pain, palpitations, orthopnea, claudication and leg swelling.  Gastrointestinal: Positive for heartburn. Negative for abdominal pain, nausea and vomiting.  Genitourinary: Negative.   Musculoskeletal: Negative for joint pain and myalgias.  Skin: Negative for rash.  Neurological: Negative for weakness.  Endo/Heme/Allergies: Negative.   Psychiatric/Behavioral: Negative.     Past Medical History:  She,  has a past medical history of Anxiety, Arthritis, Asthma, COPD (chronic obstructive pulmonary disease) ([place]), Fracture, Hypertension, and Sleep apnea.   Surgical History:   Past Surgical History:  Procedure Laterality Date  . BACK SURGERY     spinal stimulator  . BREAST BIOPSY Right 2017   benign  . COLONOSCOPY W/ BIOPSIES AND POLYPECTOMY    .  DILATION AND CURETTAGE OF UTERUS    . LAPAROSCOPIC ROUX-EN-Y GASTRIC BYPASS WITH UPPER ENDOSCOPY AND REMOVAL OF LAP BAND    . RADIAL HEAD ARTHROPLASTY Left 02/03/2019   Procedure: LEFT RADIAL HEAD ARTHROPLASTY;  Surgeon: [name], MD;  Location: [facility];  Service: Orthopedics;  Laterality: Left;  AXILLARY BLOCK VS BIER BLOCK  . TUBAL LIGATION       Social History:   reports that she quit smoking about 22 years ago. Her smoking use included cigarettes. She has a 5.00 pack-year smoking history. She has never used smokeless tobacco. She reports that she does not drink alcohol and does not use drugs.   Family History:  Her family history includes Cancer in her father; Hypertension in her mother; Stroke in her mother.   Allergies Allergies  Allergen Reactions  . Banana Hives and Itching     Home Medications  Prior to Admission medications   Medication Sig Start Date End Date  Taking? Authorizing Provider  albuterol (VENTOLIN HFA) 108 (90 Base) MCG/ACT inhaler Inhale 1-2 puffs into the lungs every 6 (six) hours as needed for wheezing or shortness of breath. 10/24/20  Yes Martyn Ehrich, NP  amLODipine (NORVASC) 5 MG tablet TAKE 1 TABLET (5 MG TOTAL) BY MOUTH DAILY. 09/24/20 12/23/20 Yes Minus Breeding, MD  ascorbic acid (VITAMIN C) 500 MG tablet Take 500 mg by mouth daily.   Yes [provider]  aspirin EC 81 MG tablet Take 81 mg by mouth daily. Swallow whole.   Yes [provider]  atorvastatin (LIPITOR) 10 MG tablet Take 10 mg by mouth daily.   Yes [provider]  bumetanide (BUMEX) 2 MG tablet Take 2 mg by mouth daily. 03/05/20  Yes [provider]  cyclobenzaprine (FLEXERIL) 10 MG tablet Take 10 mg by mouth daily. 08/09/19  Yes [provider]  Diethylpropion HCl CR 75 MG TB24 Take 1 tablet by mouth every morning. 11/02/20  Yes [provider]  Fluticasone-Umeclidin-Vilant (TRELEGY ELLIPTA) 200-62.5-25 MCG/INH AEPB Inhale 1 puff into the lungs daily. 10/24/20  Yes Martyn Ehrich, NP  furosemide (LASIX) 40 MG tablet Take 40 mg by mouth daily as needed for fluid.   Yes [provider]  ipratropium-albuterol (DUONEB) 0.5-2.5 (3) MG/3ML SOLN Take 3 mLs by nebulization every 6 (six) hours as needed. Patient taking differently: Take 3 mLs by nebulization every 6 (six) hours as needed (sob/wheezing). 11/08/20  Yes Martyn Ehrich, NP  levocetirizine (XYZAL) 5 MG tablet Take 1 tablet (5 mg total) by mouth every evening. 07/23/20  Yes Valentina Shaggy, MD  losartan-hydrochlorothiazide (HYZAAR) 100-25 MG tablet Take 1 tablet by mouth daily. 05/24/20  Yes Minus Breeding, MD  montelukast (SINGULAIR) 10 MG tablet Take 1 tablet (10 mg total) by mouth at bedtime. 07/23/20  Yes Valentina Shaggy, MD  Multiple Vitamin (MULTIVITAMIN WITH MINERALS) TABS tablet Take 1 tablet by mouth daily. Centrum Multivitamin    Yes [provider]  pantoprazole (PROTONIX) 40 MG tablet TAKE 1 TABLET BY MOUTH EVERY DAY 06/17/20  Yes Olalere, Adewale A, MD  Vitamin D, Ergocalciferol, (DRISDOL) 1.25 MG (50000 UNIT) CAPS capsule Take 50,000 Units by mouth every Monday.  10/27/19  Yes [provider]  zolpidem (AMBIEN) 10 MG tablet Take 10 mg by mouth at bedtime.   Yes [provider]  furosemide (LASIX) 40 MG tablet Take 1 tablet (40 mg total) by mouth daily as needed for fluid or edema (WEIGHT GAIN OF 2LBS, SHORTNESS OF  BREATH). 06/10/20 09/08/20  Minus Breeding, MD  HYDROcodone-acetaminophen (NORCO/VICODIN) 5-325 MG tablet Take 1 tablet by mouth every 6 (six) hours as needed. Patient not taking: Reported on 11/11/2020 01/15/20   Drenda Freeze, MD  ibuprofen (ADVIL) 800 MG tablet Take 1 tablet (800 mg total) by mouth every 8 (eight) hours as needed for mild pain. Patient not taking: Reported on 11/11/2020 06/07/20   Ward, Delice Bison, DO  ondansetron (ZOFRAN ODT) 4 MG disintegrating tablet Take 1 tablet (4 mg total) by mouth every 6 (six) hours as needed. Patient not taking: Reported on 11/11/2020 06/07/20   Ward, Delice Bison, DO  oxyCODONE-acetaminophen (PERCOCET/ROXICET) 5-325 MG tablet Take 2 tablets by mouth every 6 (six) hours as needed. Patient not taking: Reported on 11/11/2020 06/07/20   Ward, Delice Bison, DO     Critical care time: n/a    Freda Jackson, MD [place] Office: [phone]   See Amion for personal pager PCCM on call pager [phone] until 7pm. Please call Elink 7p-7a. [phone]

## 2020-11-11 NOTE — Care Management CC44 (Signed)
Condition Code 44 Documentation Completed  Patient Details  Name: Patty Howard MRN: [id number] Date of Birth: 1955-07-25   Condition Code 44 given:  Yes Patient signature on Condition Code 44 notice:  Yes Documentation of 2 MD's agreement:  Yes Code 44 added to claim:  Yes    Rosalita Carey, [place] 11/11/2020, 3:54 PM

## 2020-11-11 NOTE — Plan of Care (Signed)
Plan of care discussed with pt.

## 2020-11-11 NOTE — ED Notes (Signed)
Pt. Did not want breakfast tray. Only fluids.

## 2020-11-11 NOTE — ED Notes (Signed)
Patient transported to CT 

## 2020-11-11 NOTE — Care Management Obs Status (Signed)
[place] NOTIFICATION   Patient Details  Name: Patty Howard MRN: [id number] Date of Birth: 1955-08-26   Medicare Observation Status Notification Given:  Macario Golds, [place] 11/11/2020, 3:54 PM

## 2020-11-12 ENCOUNTER — Telehealth: Payer: Self-pay | Admitting: Pulmonary Disease

## 2020-11-12 DIAGNOSIS — J4541 Moderate persistent asthma with (acute) exacerbation: Secondary | ICD-10-CM | POA: Diagnosis not present

## 2020-11-12 LAB — CBC
HCT: 33 % — ABNORMAL LOW (ref 36.0–46.0)
Hemoglobin: 10 g/dL — ABNORMAL LOW (ref 12.0–15.0)
MCH: 26 pg (ref 26.0–34.0)
MCHC: 30.3 g/dL (ref 30.0–36.0)
MCV: 85.9 fL (ref 80.0–100.0)
Platelets: 341 10*3/uL (ref 150–400)
RBC: 3.84 MIL/uL — ABNORMAL LOW (ref 3.87–5.11)
RDW: 16.8 % — ABNORMAL HIGH (ref 11.5–15.5)
WBC: 10.5 10*3/uL (ref 4.0–10.5)
nRBC: 0 % (ref 0.0–0.2)

## 2020-11-12 LAB — COMPREHENSIVE METABOLIC PANEL
ALT: 30 U/L (ref 0–44)
AST: 20 U/L (ref 15–41)
Albumin: 3.8 g/dL (ref 3.5–5.0)
Alkaline Phosphatase: 77 U/L (ref 38–126)
Anion gap: 7 (ref 5–15)
BUN: 19 mg/dL (ref 8–23)
CO2: 30 mmol/L (ref 22–32)
Calcium: 9.2 mg/dL (ref 8.9–10.3)
Chloride: 103 mmol/L (ref 98–111)
Creatinine, Ser: 0.82 mg/dL (ref 0.44–1.00)
GFR, Estimated: >60 mL/min (ref >60)
Glucose, Bld: 143 mg/dL — ABNORMAL HIGH (ref 70–99)
Potassium: 4 mmol/L (ref 3.5–5.1)
Sodium: 140 mmol/L (ref 135–145)
Total Bilirubin: 0.5 mg/dL (ref 0.3–1.2)
Total Protein: 6.7 g/dL (ref 6.5–8.1)

## 2020-11-12 MED ORDER — BENZONATATE 100 MG PO CAPS
100.0000 mg | ORAL_CAPSULE | Freq: Three times a day (TID) | ORAL | Status: DC
  Administered 2020-11-12 – 2020-11-13 (×3): 100 mg via ORAL
  Filled 2020-11-12 (×3): qty 1

## 2020-11-12 NOTE — Consult Note (Signed)
NAME:  Patty Howard, MRN:  [id number], DOB:  17-Apr-1956, LOS: 1 ADMISSION DATE:  11/11/2020, CONSULTATION DATE:  11/11/20 REFERRING MD:  Cherylann Ratel, DO CHIEF COMPLAINT:  Shortness of breath  History of Present Illness:  Patty Howard 65 year old woman former smoker with history of asthma, obstructive sleep apnea, GERD, and obesity who presents to the [facility] emergency department on 11/11/20 with shortness of breath, cough and wheezing.  She has been seen in pulmonary clinic in late April and 2 other times during the month of May.  She had influenza in April and was treated with Tamiflu and prednisone.  Since then she has had ongoing cough, wheezing and shortness of breath.  Despite changes in her inhaler therapy and nebulizer treatments along with multiple steroid tapers she reports her breathing is not improved.  Prior to her influenza infection she reports she had similar symptoms that would not respond significantly to steroids, inhaler therapy or breathing treatments. She was also [facility]. She does not report benefit since starting this medication.   She does report history of GERD that seems well controlled on pantoprazole 33m daily.   She is using CPAP therapy at night for OSA.   Pertinent  Medical History  Asthma OSA Hypertension  Significant Hospital Events: Including procedures, antibiotic start and stop dates in addition to other pertinent events   . 5/30 admitted  Interim History / Subjective:   Patient reports some improvement in her breathing. Continues to have wheezing and productive cough of clear, whitish sputum.   Objective   Blood pressure (!) 155/91, pulse 74, temperature 98.6 F (37 C), temperature source Oral, resp. rate 16, height 5' 8"  (1.727 m), weight 131.5 kg, SpO2 96 %.       No intake or output data in the 24 hours ending 11/12/20 0931 Filed Weights   11/11/20 0342   Weight: 131.5 kg    Examination: General: obese, no acute distress, sitting up in bed HENT: [place]/AT, moist mucous membranes, sclera anicteric Lungs: scattered wheezing bilaterally, R > L Cardiovascular: rrr, no murmurs Abdomen: soft, non-tender, non-distended, BS+ Extremities: warm, no edema Neuro: alert, oriented, moving all extremities GU: deferred  Labs/imaging that I have personally reviewed  (right click and "Reselect all SmartList Selections" daily)   Echo 11/26/19: EF 65-70%. RV systolic function is normal. No valvular issues.  CXR 11/11/20: no consolidation or effusion. No pneumthorax.  CTA Chest 11/2019: Poor inspiratory effort with near collapse of her left and right main bronchi. Mild bibasilar atelectasis. Stable subcentimeter pulmonary nodules.    HRCT Chest 11/11/20: No acute findings noted. Scattered areas of scarring of distal airways similar to prior scans. Poor expiratory phase so difficult to compare to inspiratory phase for concern of tracheobronchomalacia. Pulmonary nodules are stable.  5/31: CBC, BMP reviewed  Autoimmune profile: negative ANA, ANCA, RF Normal immunoglobulin levels 10/31/19 (IgG, IgA, IgE, IgM) Negative aspergillus antibodies  No micro history of specific organisms in sputum  NM Stress trest normal 12/2019  Resolved Hospital Problem list     Assessment & Plan:   Asthma vs Tracheobronchomalacia She has not clinically improved over recent months of aggressive asthma therapies with benralizumab, steroid tapers and inhaler/nebulizer therapies. There is concern for tracheobronchomalacia given her clinic history along with the appearance of her airways on CTA chest 11/2019. HRCT chest today does not appear to have an expiratory series on full expiration, so I believe it is not an  adequate study to evaluate for TBM.  - Continue with treatment of acute asthma exacerbation with nebulizer treatments and prednisone taper - Check sputum culture -  discussed with patient the concern for TBM and that weight loss along with CPAP therapy are her best treatment options.  - she may require referral to [facility] or [facility] for further evaluation of TBM and possible stent trial in the future - patient has follow up with Dr. Ander Slade in June. Will discuss case further with him. - CT chest scan on 11/15/20 will be cancelled as she just had a CT scan yesterday  GERD - continue pantoprazole - elevate head of bed when sleeping  OSA - CPAP qhs  Obesity - weight loss recommended  Labs   CBC: Recent Labs  Lab 11/11/20 0529 11/11/20 1613 11/12/20 0331  WBC 11.8* 10.0 10.5  NEUTROABS 9.2*  --   --   HGB 9.3* 9.8* 10.0*  HCT 30.2* 31.6* 33.0*  MCV 86.0 84.7 85.9  PLT 292 327 244    Basic Metabolic Panel: Recent Labs  Lab 11/11/20 0529 11/11/20 1613 11/12/20 0331  NA 139  --  140  K 3.7  --  4.0  CL 108  --  103  CO2 23  --  30  GLUCOSE 109*  --  143*  BUN 18  --  19  CREATININE 0.64 0.65 0.82  CALCIUM 9.1  --  9.2   GFR: Estimated Creatinine Clearance: 96.8 mL/min (by C-G formula based on SCr of 0.82 mg/dL). Recent Labs  Lab 11/11/20 0529 11/11/20 1613 11/12/20 0331  WBC 11.8* 10.0 10.5    Liver Function Tests: Recent Labs  Lab 11/12/20 0331  AST 20  ALT 30  ALKPHOS 77  BILITOT 0.5  PROT 6.7  ALBUMIN 3.8   No results for input(s): LIPASE, AMYLASE in the last 168 hours. No results for input(s): AMMONIA in the last 168 hours.  ABG No results found for: PHART, PCO2ART, PO2ART, HCO3, TCO2, ACIDBASEDEF, O2SAT   Coagulation Profile: No results for input(s): INR, PROTIME in the last 168 hours.  Cardiac Enzymes: No results for input(s): CKTOTAL, CKMB, CKMBINDEX, TROPONINI in the last 168 hours.  HbA1C: Hgb A1c MFr Bld  Date/Time Value Ref Range Status  11/26/2019 01:55 AM 6.7 (H) 4.8 - 5.6 % Final    Comment:    RESULTS CONFIRMED BY MANUAL DILUTION (NOTE) Pre diabetes:          5.7%-6.4%  Diabetes:               >6.4%  Glycemic control for   <7.0% adults with diabetes   05/23/2019 06:22 AM 6.3 (H) 4.8 - 5.6 % Final    Comment:    (NOTE) Pre diabetes:          5.7%-6.4% Diabetes:              >6.4% Glycemic control for   <7.0% adults with diabetes     CBG: No results for input(s): GLUCAP in the last 168 hours.     Critical care time: n/a    Freda Jackson, MD [facility] Office: [phone]   See Amion for personal pager PCCM on call pager [phone] until 7pm. Please call Elink 7p-7a. [phone]

## 2020-11-12 NOTE — Telephone Encounter (Signed)
Patient is scheduled for a CT chest on 11/15/20. She does not need this CT scan anymore as she just had one here in the hospital yesterday.   Please cancel this the CT scan on 11/15/20.   Thanks, Wille Glaser

## 2020-11-12 NOTE — Progress Notes (Addendum)
PROGRESS NOTE    Patty Howard  BHA:[id number] DOB: 08-27-1955 DOA: 11/11/2020 PCP: Buzzy Han, MD   Brief Narrative:  This 65 years old female with PMH significant for asthma, hypertension, anxiety presented in the ED with worsening shortness of breath.  Patient reports has been short of breath for last 6 weeks,  she was treated for flu recently.  Symptoms has been persistent since then,  she has seen her regular pulmonologist couple of times in the interim and she was prescribed steroids and breathing treatments but they did not resolve her symptoms.  She has difficulty breathing while walking at home associated with cough.  She presented in the ED with worsening symptoms.  Chest x-ray was negative. COVID-negative, she is started on nebulization steroids and magnesium.  Pulmonology was consulted.  There is a concern about tracheo bronchiomalacia.  Assessment & Plan:   Active Problems:   Acute asthma exacerbation   Asthma exacerbation: Patient presented with progressive cough and shortness of breath. She has recently been treated for flu and has completed steroid treatment. Continue Solu-Medrol, Duoneb nebulization. Continue Zithromax and guanfacine for cough. Pulmonology consulted, there is a concern for tracheobronchomalacia. Pulmonology considers referral to [facility] pulmonology.  Hypertension: Continue amlodipine,  losartan and hydrochlorothiazide.  Anxiety: Continue home medications  GERD continue Protonix  Hyperlipidemia :  continue statins   Morbid obesity:  Continue diet and lifestyle changes.   DVT prophylaxis: Lovenox. Code Status: Full code Family Communication: No family at bedside Disposition Plan:   Status is: Observation  The patient remains OBS appropriate and will d/c before 2 midnights.  Dispo: The patient is from: Home              Anticipated d/c is to: Home              Patient currently is not medically stable to d/c.   Difficult  to place patient No  Consultants:   Pulmonology  Procedures: HRCT Antimicrobials: Anti-infectives (From admission, onward)   Start     Dose/Rate Route Frequency Ordered Stop   11/12/20 1000  azithromycin (ZITHROMAX) tablet 500 mg       "Followed by" Linked Group Details   500 mg Oral Daily 11/11/20 1523 11/16/20 0959   11/11/20 1800  azithromycin (ZITHROMAX) 500 mg in sodium chloride 0.9 % 250 mL IVPB       "Followed by" Linked Group Details   500 mg 250 mL/hr over 60 Minutes Intravenous Every 24 hours 11/11/20 1523 11/11/20 1916       Subjective: Patient was seen and examined at bedside.  Overnight events noted.  Patient reports feeling better.   Patient still has significant wheezing and tightness noted on examination but denies any chest pain. She also reports worsening cough, trying to get phlegm out. Able to speak full sentences.  Objective: Vitals:   11/12/20 0449 11/12/20 0747 11/12/20 0939 11/12/20 1332  BP: (!) 155/91  (!) 157/88 (!) 148/94  Pulse: 74  (!) 104 99  Resp: 16  18 18   Temp: 98.6 F (37 C)   98.7 F (37.1 C)  TempSrc: Oral   Oral  SpO2: 93% 96% 96% 96%  Weight:      Height:       No intake or output data in the 24 hours ending 11/12/20 1419 Filed Weights   11/11/20 0342  Weight: 131.5 kg    Examination:  General exam: Appears calm and comfortable, not in any acute distress. Respiratory system: Wheezing on auscultation. Respiratory  effort normal. RR 21 Cardiovascular system: S1 & S2 heard, RRR. No JVD, murmurs, rubs, gallops or clicks. No pedal edema. Gastrointestinal system: Abdomen is nondistended, soft and nontender. No organomegaly or masses felt. Normal bowel sounds heard. Central nervous system: Alert and oriented. No focal neurological deficits. Extremities: Symmetric 5 x 5 power.  No cyanosis, no clubbing. Skin: No rashes, lesions or ulcers Psychiatry: Judgement and insight appear normal. Mood & affect appropriate.     Data  Reviewed: I have personally reviewed following labs and imaging studies  CBC: Recent Labs  Lab 11/11/20 0529 11/11/20 1613 11/12/20 0331  WBC 11.8* 10.0 10.5  NEUTROABS 9.2*  --   --   HGB 9.3* 9.8* 10.0*  HCT 30.2* 31.6* 33.0*  MCV 86.0 84.7 85.9  PLT 292 327 789   Basic Metabolic Panel: Recent Labs  Lab 11/11/20 0529 11/11/20 1613 11/12/20 0331  NA 139  --  140  K 3.7  --  4.0  CL 108  --  103  CO2 23  --  30  GLUCOSE 109*  --  143*  BUN 18  --  19  CREATININE 0.64 0.65 0.82  CALCIUM 9.1  --  9.2   GFR: Estimated Creatinine Clearance: 96.8 mL/min (by C-G formula based on SCr of 0.82 mg/dL). Liver Function Tests: Recent Labs  Lab 11/12/20 0331  AST 20  ALT 30  ALKPHOS 77  BILITOT 0.5  PROT 6.7  ALBUMIN 3.8   No results for input(s): LIPASE, AMYLASE in the last 168 hours. No results for input(s): AMMONIA in the last 168 hours. Coagulation Profile: No results for input(s): INR, PROTIME in the last 168 hours. Cardiac Enzymes: No results for input(s): CKTOTAL, CKMB, CKMBINDEX, TROPONINI in the last 168 hours. BNP (last 3 results) No results for input(s): PROBNP in the last 8760 hours. HbA1C: No results for input(s): HGBA1C in the last 72 hours. CBG: No results for input(s): GLUCAP in the last 168 hours. Lipid Profile: No results for input(s): CHOL, HDL, LDLCALC, TRIG, CHOLHDL, LDLDIRECT in the last 72 hours. Thyroid Function Tests: No results for input(s): TSH, T4TOTAL, FREET4, T3FREE, THYROIDAB in the last 72 hours. Anemia Panel: Recent Labs    11/11/20 1613  TIBC 367  IRON 83   Sepsis Labs: No results for input(s): PROCALCITON, LATICACIDVEN in the last 168 hours.  Recent Results (from the past 240 hour(s))  Resp Panel by RT-PCR (Flu A&B, Covid) Nasopharyngeal Swab     Status: None   Collection Time: 11/11/20  6:26 AM   Specimen: Nasopharyngeal Swab; Nasopharyngeal(NP) swabs in vial transport medium  Result Value Ref Range Status   SARS  Coronavirus 2 by RT PCR NEGATIVE NEGATIVE Final    Comment: (NOTE) SARS-CoV-2 target nucleic acids are NOT DETECTED.  The SARS-CoV-2 RNA is generally detectable in upper respiratory specimens during the acute phase of infection. The lowest concentration of SARS-CoV-2 viral copies this assay can detect is 138 copies/mL. A negative result does not preclude SARS-Cov-2 infection and should not be used as the sole basis for treatment or other patient management decisions. A negative result may occur with  improper specimen collection/handling, submission of specimen other than nasopharyngeal swab, presence of viral mutation(s) within the areas targeted by this assay, and inadequate number of viral copies(<138 copies/mL). A negative result must be combined with clinical observations, patient history, and epidemiological information. The expected result is Negative.  Fact Sheet for Patients:  EntrepreneurPulse.com.au  Fact Sheet for Healthcare Providers:  IncredibleEmployment.be  This  test is no t yet approved or cleared by the [place] and  has been authorized for detection and/or diagnosis of SARS-CoV-2 by FDA under an Emergency Use Authorization (EUA). This EUA will remain  in effect (meaning this test can be used) for the duration of the COVID-19 declaration under Section 564(b)(1) of the Act, 21 U.S.C.section 360bbb-3(b)(1), unless the authorization is terminated  or revoked sooner.       Influenza A by PCR NEGATIVE NEGATIVE Final   Influenza B by PCR NEGATIVE NEGATIVE Final    Comment: (NOTE) The Xpert Xpress SARS-CoV-2/FLU/RSV plus assay is intended as an aid in the diagnosis of influenza from Nasopharyngeal swab specimens and should not be used as a sole basis for treatment. Nasal washings and aspirates are unacceptable for Xpert Xpress SARS-CoV-2/FLU/RSV testing.  Fact Sheet for  Patients: EntrepreneurPulse.com.au  Fact Sheet for Healthcare Providers: IncredibleEmployment.be  This test is not yet approved or cleared by the [place] FDA and has been authorized for detection and/or diagnosis of SARS-CoV-2 by FDA under an Emergency Use Authorization (EUA). This EUA will remain in effect (meaning this test can be used) for the duration of the COVID-19 declaration under Section 564(b)(1) of the Act, 21 U.S.C. section 360bbb-3(b)(1), unless the authorization is terminated or revoked.  Performed at [facility], [place] [address], [place], [place] [address]    Radiology Studies: CT Chest High Resolution  Result Date: 11/11/2020 CLINICAL DATA:  65 year old female with history of severe asthma. Shortness of breath for the past 6 weeks. EXAM: CT CHEST WITHOUT CONTRAST TECHNIQUE: Multidetector CT imaging of the chest was performed following the standard protocol without intravenous contrast. High resolution imaging of the lungs, as well as inspiratory and expiratory imaging, was performed. COMPARISON:  Chest CT 11/26/2019. FINDINGS: Cardiovascular: Heart size is mildly enlarged. There is no significant pericardial fluid, thickening or pericardial calcification. Aortic atherosclerosis. No definite coronary artery calcifications. Mediastinum/Nodes: No pathologically enlarged mediastinal or hilar lymph nodes. Please note that accurate exclusion of hilar adenopathy is limited on noncontrast CT scans. Esophagus is unremarkable in appearance. No axillary lymphadenopathy. Lungs/Pleura: There are a few scattered areas of mild architectural distortion, most compatible with areas of chronic post infectious or inflammatory scarring, most evident in the apex of the left upper lobe and in the right middle lobe. High-resolution images otherwise demonstrate no generalized regions of ground-glass attenuation, septal thickening, subpleural  reticulation, traction bronchiectasis or honeycombing to indicate interstitial lung disease. Inspiratory and expiratory imaging is unremarkable. No acute consolidative airspace disease. No pleural effusions. A few scattered tiny pulmonary nodules are noted measuring 2-4 mm in size stable in size and number compared to the prior examination from 11/26/2019, considered definitively benign. No larger more suspicious appearing pulmonary nodules or masses are noted. Upper Abdomen: LapBand in position. Musculoskeletal: There are no aggressive appearing lytic or blastic lesions noted in the visualized portions of the skeleton. IMPRESSION: 1. No findings to suggest interstitial lung disease. No acute findings in the thorax. Scattered areas of mild post infectious or inflammatory scarring in the lungs, as above. 2. Small pulmonary nodules measuring 2-4 mm in size, stable compared to the prior study, considered definitively benign. 3. Aortic atherosclerosis. 4. Mild cardiomegaly. Aortic Atherosclerosis (ICD10-I70.0). Electronically Signed   By: Vinnie Langton M.D.   On: 11/11/2020 09:57   DG Chest Port 1 View  Result Date: 11/11/2020 CLINICAL DATA:  Cough and shortness of breath EXAM: PORTABLE CHEST 1 VIEW COMPARISON:  10/10/2020 FINDINGS: Borderline heart  size accentuated by technique. Stable mediastinal contours. There is no edema, consolidation, effusion, or pneumothorax. Artifact from EKG leads. IMPRESSION: No acute or interval finding. Electronically Signed   By: Monte Fantasia M.D.   On: 11/11/2020 04:50   Scheduled Meds: . amLODipine  5 mg Oral Daily  . arformoterol  15 mcg Nebulization BID  . aspirin EC  81 mg Oral Daily  . atorvastatin  10 mg Oral Daily  . azithromycin  500 mg Oral Daily  . bumetanide  2 mg Oral Daily  . cyclobenzaprine  10 mg Oral Daily  . enoxaparin (LOVENOX) injection  60 mg Subcutaneous Q24H  . guaiFENesin  600 mg Oral BID  . losartan  100 mg Oral Daily   And  .  hydrochlorothiazide  25 mg Oral Daily  . ibuprofen  600 mg Oral TID  . loratadine  10 mg Oral QPM  . methylPREDNISolone (SOLU-MEDROL) injection  40 mg Intravenous BID  . montelukast  10 mg Oral QHS  . pantoprazole  40 mg Oral Daily  . revefenacin  175 mcg Nebulization Daily  . zolpidem  5 mg Oral QHS   Continuous Infusions:   LOS: 1 day    Time spent: 35 mins    Mattthew Ziomek, MD [facility]   If 7PM-7AM, please contact night-coverage

## 2020-11-12 NOTE — Telephone Encounter (Signed)
Pt did cancel the appt that was scheduled with [facility] 5/26. Will close encounter.

## 2020-11-12 NOTE — Telephone Encounter (Signed)
I have called and LM on VM at [facility] CT to have them cancel the CT that is scheduled for June 3 at 915.  Pt did have CT scan done in the hospital.

## 2020-11-13 DIAGNOSIS — J45901 Unspecified asthma with (acute) exacerbation: Secondary | ICD-10-CM | POA: Diagnosis present

## 2020-11-13 DIAGNOSIS — J449 Chronic obstructive pulmonary disease, unspecified: Secondary | ICD-10-CM | POA: Diagnosis present

## 2020-11-13 DIAGNOSIS — D638 Anemia in other chronic diseases classified elsewhere: Secondary | ICD-10-CM | POA: Diagnosis present

## 2020-11-13 DIAGNOSIS — Z87891 Personal history of nicotine dependence: Secondary | ICD-10-CM | POA: Diagnosis not present

## 2020-11-13 DIAGNOSIS — B9781 Human metapneumovirus as the cause of diseases classified elsewhere: Secondary | ICD-10-CM | POA: Diagnosis present

## 2020-11-13 DIAGNOSIS — J4551 Severe persistent asthma with (acute) exacerbation: Secondary | ICD-10-CM | POA: Diagnosis present

## 2020-11-13 DIAGNOSIS — I10 Essential (primary) hypertension: Secondary | ICD-10-CM

## 2020-11-13 DIAGNOSIS — J4541 Moderate persistent asthma with (acute) exacerbation: Secondary | ICD-10-CM | POA: Diagnosis not present

## 2020-11-13 DIAGNOSIS — Z7982 Long term (current) use of aspirin: Secondary | ICD-10-CM | POA: Diagnosis not present

## 2020-11-13 DIAGNOSIS — Z91018 Allergy to other foods: Secondary | ICD-10-CM | POA: Diagnosis not present

## 2020-11-13 DIAGNOSIS — M940 Chondrocostal junction syndrome [Tietze]: Secondary | ICD-10-CM | POA: Diagnosis present

## 2020-11-13 DIAGNOSIS — R0602 Shortness of breath: Secondary | ICD-10-CM | POA: Diagnosis present

## 2020-11-13 DIAGNOSIS — Z7951 Long term (current) use of inhaled steroids: Secondary | ICD-10-CM | POA: Diagnosis not present

## 2020-11-13 DIAGNOSIS — F419 Anxiety disorder, unspecified: Secondary | ICD-10-CM | POA: Diagnosis present

## 2020-11-13 DIAGNOSIS — Z8249 Family history of ischemic heart disease and other diseases of the circulatory system: Secondary | ICD-10-CM | POA: Diagnosis not present

## 2020-11-13 DIAGNOSIS — Z9884 Bariatric surgery status: Secondary | ICD-10-CM | POA: Diagnosis not present

## 2020-11-13 DIAGNOSIS — G4733 Obstructive sleep apnea (adult) (pediatric): Secondary | ICD-10-CM | POA: Diagnosis present

## 2020-11-13 DIAGNOSIS — Z6841 Body Mass Index (BMI) 40.0 and over, adult: Secondary | ICD-10-CM | POA: Diagnosis not present

## 2020-11-13 DIAGNOSIS — D72829 Elevated white blood cell count, unspecified: Secondary | ICD-10-CM | POA: Diagnosis present

## 2020-11-13 DIAGNOSIS — Z20822 Contact with and (suspected) exposure to covid-19: Secondary | ICD-10-CM | POA: Diagnosis present

## 2020-11-13 DIAGNOSIS — E785 Hyperlipidemia, unspecified: Secondary | ICD-10-CM | POA: Diagnosis present

## 2020-11-13 DIAGNOSIS — K219 Gastro-esophageal reflux disease without esophagitis: Secondary | ICD-10-CM | POA: Diagnosis present

## 2020-11-13 DIAGNOSIS — Z79899 Other long term (current) drug therapy: Secondary | ICD-10-CM | POA: Diagnosis not present

## 2020-11-13 HISTORY — DX: Unspecified asthma with (acute) exacerbation: J45.901

## 2020-11-13 LAB — EXPECTORATED SPUTUM ASSESSMENT W GRAM STAIN, RFLX TO RESP C

## 2020-11-13 LAB — RESPIRATORY PANEL BY PCR

## 2020-11-13 LAB — PROCALCITONIN: Procalcitonin: <0.1 ng/mL

## 2020-11-13 MED ORDER — TRAMADOL HCL 50 MG PO TABS: 50.0000 mg | ORAL_TABLET | Freq: Four times a day (QID) | ORAL | Status: DC | PRN

## 2020-11-13 MED ORDER — ALBUTEROL SULFATE (2.5 MG/3ML) 0.083% IN NEBU
2.5000 mg | INHALATION_SOLUTION | RESPIRATORY_TRACT | Status: DC | PRN
  Administered 2020-11-13 – 2020-11-14 (×5): 2.5 mg via RESPIRATORY_TRACT
  Filled 2020-11-13 (×6): qty 3

## 2020-11-13 MED ORDER — BENZONATATE 100 MG PO CAPS
200.0000 mg | ORAL_CAPSULE | Freq: Three times a day (TID) | ORAL | Status: DC
  Administered 2020-11-13 – 2020-11-18 (×15): 200 mg via ORAL
  Filled 2020-11-13 (×15): qty 2

## 2020-11-13 MED ORDER — AMOXICILLIN-POT CLAVULANATE 875-125 MG PO TABS
1.0000 | ORAL_TABLET | Freq: Two times a day (BID) | ORAL | Status: DC
  Administered 2020-11-13 – 2020-11-18 (×10): 1 via ORAL
  Filled 2020-11-13 (×10): qty 1

## 2020-11-13 MED ORDER — METHYLPREDNISOLONE SODIUM SUCC 125 MG IJ SOLR
60.0000 mg | Freq: Four times a day (QID) | INTRAMUSCULAR | Status: DC
  Administered 2020-11-13 – 2020-11-15 (×8): 60 mg via INTRAVENOUS
  Filled 2020-11-13 (×8): qty 2

## 2020-11-13 MED ORDER — GUAIFENESIN-CODEINE 100-10 MG/5ML PO SOLN
5.0000 mL | ORAL | Status: DC | PRN
  Administered 2020-11-13 – 2020-11-17 (×13): 5 mL via ORAL
  Filled 2020-11-13 (×13): qty 5

## 2020-11-13 MED ORDER — LEVOFLOXACIN 750 MG PO TABS: 750.0000 mg | ORAL_TABLET | Freq: Every day | ORAL | Status: DC

## 2020-11-13 NOTE — Progress Notes (Signed)
Patient ID: Patty Howard, female   DOB: 11-Apr-1956, 65 y.o.   MRN: [id number]  PROGRESS NOTE    Patty Howard  PXT:[id number] DOB: Mar 23, 1956 DOA: 11/11/2020 PCP: Buzzy Han, MD   Brief Narrative:  65 years old female with PMH significant for asthma, hypertension, anxiety presented in the ED with worsening shortness of breath for the last 6 weeks.  She was treated for flu as an outpatient recently and also treated with steroids and breathing treatments by her regular pulmonologist without resolution of symptoms.  On presentation to the ED, chest x-ray was negative for infiltrates; COVID was negative.  She was started on intravenous steroids.  Pulmonary was consulted.  Assessment & Plan:   Possible asthma exacerbation -There is a concern for tracheobronchomalacia as well -Pulmonary following.  Currently on IV Solu-Medrol.  Patient does not feel well.  Increase Solu-Medrol to 60 mg IV every 6 hours.  Continue montelukast along with other labs -HRCT on presentation showed no acute findings but there was a concern for tracheobronchomalacia.  Pulmonary recommends outpatient follow-up with pulmonary and may require referral to [facility] or [facility] for further evaluation of tracheobronchomalacia -Switch Zithromax to Levaquin as per pulmonary recommendations  GERD -Continue PPI  Hypertension  -Blood pressure intermittently elevated.  Continue amlodipine, losartan and hydrochlorothiazide and Bumex  Leukocytosis -Resolved  Anemia of chronic disease -Questionable cause.  Hemoglobin stable.  Outpatient follow-up  Morbid obesity -Outpatient follow-up  Hyperlipidemia  -continue statin  DVT prophylaxis: Lovenox  code Status: Full Family Communication: None at bedside Disposition Plan: Status is: Inpatient  Remains inpatient appropriate because:Inpatient level of care appropriate due to severity of illness   Dispo: The patient is from: Home              Anticipated d/c  is to: Home in 1 to 2 days once clinically improves              Patient currently is not medically stable to d/c.   Difficult to place patient No  Consultants: Pulmonary  Procedures: None  Antimicrobials: Zithromax   Subjective: Patient seen and examined at bedside.  Does not feel well at all.  Still wheezing and coughing intermittently.  No overnight fever, vomiting, chest pain reported.  Objective: Vitals:   11/13/20 0523 11/13/20 0810 11/13/20 0812 11/13/20 0940  BP: 132/85   (!) 163/89  Pulse: 87   91  Resp: 17     Temp: 98 F (36.7 C)     TempSrc: Oral     SpO2: 96% 96% 96% 100%  Weight:      Height:        Intake/Output Summary (Last 24 hours) at 11/13/2020 1028 Last data filed at 11/12/2020 1700 Gross per 24 hour  Intake 240 ml  Output --  Net 240 ml   Filed Weights   11/11/20 0342  Weight: 131.5 kg    Examination:  General exam: Looks to be in mild distress secondary to intermittent cough.  Currently on room air. Respiratory system: Bilateral decreased breath sounds at bases with scattered crackles and diffuse wheezing Cardiovascular system: S1 & S2 heard, Rate controlled Gastrointestinal system: Abdomen is morbidly obese, nondistended, soft and nontender. Normal bowel sounds heard. Extremities: No cyanosis, clubbing; trace lower extremity edema Central nervous system: Alert and oriented. No focal neurological deficits. Moving extremities Skin: No rashes, lesions or ulcers Psychiatry: Looks intermittently anxious   Data Reviewed: I have personally reviewed following labs and imaging studies  CBC: Recent Labs  Lab 11/11/20  5465 11/11/20 1613 11/12/20 0331  WBC 11.8* 10.0 10.5  NEUTROABS 9.2*  --   --   HGB 9.3* 9.8* 10.0*  HCT 30.2* 31.6* 33.0*  MCV 86.0 84.7 85.9  PLT 292 327 035   Basic Metabolic Panel: Recent Labs  Lab 11/11/20 0529 11/11/20 1613 11/12/20 0331  NA 139  --  140  K 3.7  --  4.0  CL 108  --  103  CO2 23  --  30   GLUCOSE 109*  --  143*  BUN 18  --  19  CREATININE 0.64 0.65 0.82  CALCIUM 9.1  --  9.2   GFR: Estimated Creatinine Clearance: 96.8 mL/min (by C-G formula based on SCr of 0.82 mg/dL). Liver Function Tests: Recent Labs  Lab 11/12/20 0331  AST 20  ALT 30  ALKPHOS 77  BILITOT 0.5  PROT 6.7  ALBUMIN 3.8   No results for input(s): LIPASE, AMYLASE in the last 168 hours. No results for input(s): AMMONIA in the last 168 hours. Coagulation Profile: No results for input(s): INR, PROTIME in the last 168 hours. Cardiac Enzymes: No results for input(s): CKTOTAL, CKMB, CKMBINDEX, TROPONINI in the last 168 hours. BNP (last 3 results) No results for input(s): PROBNP in the last 8760 hours. HbA1C: No results for input(s): HGBA1C in the last 72 hours. CBG: No results for input(s): GLUCAP in the last 168 hours. Lipid Profile: No results for input(s): CHOL, HDL, LDLCALC, TRIG, CHOLHDL, LDLDIRECT in the last 72 hours. Thyroid Function Tests: No results for input(s): TSH, T4TOTAL, FREET4, T3FREE, THYROIDAB in the last 72 hours. Anemia Panel: Recent Labs    11/11/20 1613  TIBC 367  IRON 83   Sepsis Labs: No results for input(s): PROCALCITON, LATICACIDVEN in the last 168 hours.  Recent Results (from the past 240 hour(s))  Resp Panel by RT-PCR (Flu A&B, Covid) Nasopharyngeal Swab     Status: None   Collection Time: 11/11/20  6:26 AM   Specimen: Nasopharyngeal Swab; Nasopharyngeal(NP) swabs in vial transport medium  Result Value Ref Range Status   SARS Coronavirus 2 by RT PCR NEGATIVE NEGATIVE Final    Comment: (NOTE) SARS-CoV-2 target nucleic acids are NOT DETECTED.  The SARS-CoV-2 RNA is generally detectable in upper respiratory specimens during the acute phase of infection. The lowest concentration of SARS-CoV-2 viral copies this assay can detect is 138 copies/mL. A negative result does not preclude SARS-Cov-2 infection and should not be used as the sole basis for treatment  or other patient management decisions. A negative result may occur with  improper specimen collection/handling, submission of specimen other than nasopharyngeal swab, presence of viral mutation(s) within the areas targeted by this assay, and inadequate number of viral copies(<138 copies/mL). A negative result must be combined with clinical observations, patient history, and epidemiological information. The expected result is Negative.  Fact Sheet for Patients:  EntrepreneurPulse.com.au  Fact Sheet for Healthcare Providers:  IncredibleEmployment.be  This test is no t yet approved or cleared by the [place] FDA and  has been authorized for detection and/or diagnosis of SARS-CoV-2 by FDA under an Emergency Use Authorization (EUA). This EUA will remain  in effect (meaning this test can be used) for the duration of the COVID-19 declaration under Section 564(b)(1) of the Act, 21 U.S.C.section 360bbb-3(b)(1), unless the authorization is terminated  or revoked sooner.       Influenza A by PCR NEGATIVE NEGATIVE Final   Influenza B by PCR NEGATIVE NEGATIVE Final    Comment: (NOTE)  The Xpert Xpress SARS-CoV-2/FLU/RSV plus assay is intended as an aid in the diagnosis of influenza from Nasopharyngeal swab specimens and should not be used as a sole basis for treatment. Nasal washings and aspirates are unacceptable for Xpert Xpress SARS-CoV-2/FLU/RSV testing.  Fact Sheet for Patients: EntrepreneurPulse.com.au  Fact Sheet for Healthcare Providers: IncredibleEmployment.be  This test is not yet approved or cleared by the [place] FDA and has been authorized for detection and/or diagnosis of SARS-CoV-2 by FDA under an Emergency Use Authorization (EUA). This EUA will remain in effect (meaning this test can be used) for the duration of the COVID-19 declaration under Section 564(b)(1) of the Act, 21 U.S.C. section  360bbb-3(b)(1), unless the authorization is terminated or revoked.  Performed at [facility], [place] [address], [place], [place] [address]          Radiology Studies: No results found.      Scheduled Meds: . amLODipine  5 mg Oral Daily  . arformoterol  15 mcg Nebulization BID  . aspirin EC  81 mg Oral Daily  . atorvastatin  10 mg Oral Daily  . azithromycin  500 mg Oral Daily  . benzonatate  100 mg Oral TID  . bumetanide  2 mg Oral Daily  . cyclobenzaprine  10 mg Oral Daily  . enoxaparin (LOVENOX) injection  60 mg Subcutaneous Q24H  . guaiFENesin  600 mg Oral BID  . losartan  100 mg Oral Daily   And  . hydrochlorothiazide  25 mg Oral Daily  . ibuprofen  600 mg Oral TID  . loratadine  10 mg Oral QPM  . methylPREDNISolone (SOLU-MEDROL) injection  40 mg Intravenous BID  . montelukast  10 mg Oral QHS  . pantoprazole  40 mg Oral Daily  . revefenacin  175 mcg Nebulization Daily  . zolpidem  5 mg Oral QHS   Continuous Infusions:        Aline August, MD [facility] 11/13/2020, 10:28 AM

## 2020-11-13 NOTE — Progress Notes (Signed)
NAME:  Patty Howard, MRN:  [id number], DOB:  01-05-1956, LOS: 1 ADMISSION DATE:  11/11/2020, CONSULTATION DATE:  11/11/20 REFERRING MD:  Cherylann Ratel, DO CHIEF COMPLAINT:  Shortness of breath  History of Present Illness:  Patty Howard 65 year old woman former smoker with history of asthma, obstructive sleep apnea, GERD, and obesity who presents to the [facility] emergency department on 11/11/20 with shortness of breath, cough and wheezing.  She has been seen in pulmonary clinic in late April and 2 other times during the month of May.  She had influenza in April and was treated with Tamiflu and prednisone.  Since then she has had ongoing cough, wheezing and shortness of breath.  Despite changes in her inhaler therapy and nebulizer treatments along with multiple steroid tapers she reports her breathing is not improved.  Prior to her influenza infection she reports she had similar symptoms that would not respond significantly to steroids, inhaler therapy or breathing treatments. She [facility] also [facility] She does not report benefit since starting this medication.   She does report history of GERD that seems well controlled on pantoprazole 72m daily.   She is using CPAP therapy at night for OSA.   Pertinent  Medical History  Asthma OSA Hypertension  Significant Hospital Events: Including procedures, antibiotic start and stop dates in addition to other pertinent events   . 5/30 admitted . 6/1 augmentin started, changed from azithromycin  Interim History / Subjective:   Continues to have wheezing and productive cough of clear, whitish sputum. She feels a bit worse today.  Objective   Blood pressure (!) 163/89, pulse 91, temperature 98 F (36.7 C), temperature source Oral, resp. rate 17, height 5' 8"  (1.727 m), weight 131.5 kg, SpO2 100 %.        Intake/Output Summary (Last 24 hours) at 11/13/2020 1127 Last  data filed at 11/12/2020 1700 Gross per 24 hour  Intake 240 ml  Output --  Net 240 ml   Filed Weights   11/11/20 0342  Weight: 131.5 kg    Examination: General: obese, no acute distress, sitting up in bed HENT: [place]/AT, moist mucous membranes, sclera anicteric Lungs: scattered rhonchi bilaterally, improved air movement. Cardiovascular: rrr, no murmurs Abdomen: soft, non-tender, non-distended, BS+ Extremities: warm, no edema Neuro: alert, oriented, moving all extremities GU: deferred  Labs/imaging that I have personally reviewed  (right click and "Reselect all SmartList Selections" daily)   Echo 11/26/19: EF 65-70%. RV systolic function is normal. No valvular issues.  CXR 11/11/20: no consolidation or effusion. No pneumthorax.  CTA Chest 11/2019: Poor inspiratory effort with near collapse of her left and right main bronchi. Mild bibasilar atelectasis. Stable subcentimeter pulmonary nodules.    HRCT Chest 11/11/20: No acute findings noted. Scattered areas of scarring of distal airways similar to prior scans. Poor expiratory phase so difficult to compare to inspiratory phase for concern of tracheobronchomalacia. Pulmonary nodules are stable.  5/31: CBC, BMP reviewed  Autoimmune profile: negative ANA, ANCA, RF Normal immunoglobulin levels 10/31/19 (IgG, IgA, IgE, IgM) Negative aspergillus antibodies  No micro history of specific organisms in sputum  NM Stress trest normal 12/2019  Resolved Hospital Problem list     Assessment & Plan:   Asthma vs Tracheobronchomalacia She has not clinically improved over recent months of aggressive asthma therapies with benralizumab, steroid tapers and inhaler/nebulizer therapies. There is concern for tracheobronchomalacia given her clinic history along with the appearance of her airways  on CTA chest 11/2019. HRCT chest today does not appear to have an expiratory series on full expiration, so I believe it is not an adequate study to evaluate for TBM.   - Continue with treatment of acute asthma exacerbation with nebulizer treatments and IV solumedrol.  - Azithromycin changed to augmentin per patient preference over levaquin. - Check sputum culture and extended viral panel today - discussed with patient the concern for TBM and that weight loss along with CPAP therapy are her best treatment options.  - she may require referral to [facility] or [facility] for further evaluation of TBM and possible stent trial in the future - patient has follow up with Dr. Ander Slade in June.  GERD - continue pantoprazole - elevate head of bed when sleeping  OSA - CPAP qhs  Obesity - weight loss recommended  Labs   CBC: Recent Labs  Lab 11/11/20 0529 11/11/20 1613 11/12/20 0331  WBC 11.8* 10.0 10.5  NEUTROABS 9.2*  --   --   HGB 9.3* 9.8* 10.0*  HCT 30.2* 31.6* 33.0*  MCV 86.0 84.7 85.9  PLT 292 327 063    Basic Metabolic Panel: Recent Labs  Lab 11/11/20 0529 11/11/20 1613 11/12/20 0331  NA 139  --  140  K 3.7  --  4.0  CL 108  --  103  CO2 23  --  30  GLUCOSE 109*  --  143*  BUN 18  --  19  CREATININE 0.64 0.65 0.82  CALCIUM 9.1  --  9.2   GFR: Estimated Creatinine Clearance: 96.8 mL/min (by C-G formula based on SCr of 0.82 mg/dL). Recent Labs  Lab 11/11/20 0529 11/11/20 1613 11/12/20 0331 11/13/20 0937  PROCALCITON  --   --   --  <0.10  WBC 11.8* 10.0 10.5  --     Liver Function Tests: Recent Labs  Lab 11/12/20 0331  AST 20  ALT 30  ALKPHOS 77  BILITOT 0.5  PROT 6.7  ALBUMIN 3.8   No results for input(s): LIPASE, AMYLASE in the last 168 hours. No results for input(s): AMMONIA in the last 168 hours.  ABG No results found for: PHART, PCO2ART, PO2ART, HCO3, TCO2, ACIDBASEDEF, O2SAT   Coagulation Profile: No results for input(s): INR, PROTIME in the last 168 hours.  Cardiac Enzymes: No results for input(s): CKTOTAL, CKMB, CKMBINDEX, TROPONINI in the last 168 hours.  HbA1C: Hgb A1c MFr Bld  Date/Time Value Ref Range  Status  11/26/2019 01:55 AM 6.7 (H) 4.8 - 5.6 % Final    Comment:    RESULTS CONFIRMED BY MANUAL DILUTION (NOTE) Pre diabetes:          5.7%-6.4%  Diabetes:              >6.4%  Glycemic control for   <7.0% adults with diabetes   05/23/2019 06:22 AM 6.3 (H) 4.8 - 5.6 % Final    Comment:    (NOTE) Pre diabetes:          5.7%-6.4% Diabetes:              >6.4% Glycemic control for   <7.0% adults with diabetes     CBG: No results for input(s): GLUCAP in the last 168 hours.     Critical care time: n/a    Freda Jackson, MD [facility] Office: [phone]   See Amion for personal pager PCCM on call pager [phone] until 7pm. Please call Elink 7p-7a. [phone]

## 2020-11-14 DIAGNOSIS — J4541 Moderate persistent asthma with (acute) exacerbation: Secondary | ICD-10-CM | POA: Diagnosis not present

## 2020-11-14 MED ORDER — GUAIFENESIN ER 600 MG PO TB12
1200.0000 mg | ORAL_TABLET | Freq: Two times a day (BID) | ORAL | Status: DC
  Administered 2020-11-14 – 2020-11-18 (×8): 1200 mg via ORAL
  Filled 2020-11-14 (×8): qty 2

## 2020-11-14 NOTE — Progress Notes (Signed)
Patient ID: Patty Howard, female   DOB: Apr 04, 1956, 64 y.o.   MRN: [id number]  PROGRESS NOTE    Patty Howard  LPF:[id number] DOB: March 26, 1956 DOA: 11/11/2020 PCP: Buzzy Han, MD   Brief Narrative:  65 years old female with PMH significant for asthma, hypertension, anxiety presented in the ED with worsening shortness of breath for the last 6 weeks.  She was treated for flu as an outpatient recently and also treated with steroids and breathing treatments by her regular pulmonologist without resolution of symptoms.  On presentation to the ED, chest x-ray was negative for infiltrates; COVID was negative.  She was started on intravenous steroids.  Pulmonary was consulted.  Assessment & Plan:   Possible asthma exacerbation -There is a concern for tracheobronchomalacia as well -Pulmonary following.  Currently on IV Solu-Medrol 60 mg IV every 6 hours.  Patient feels only slightly better compared to yesterday.  Continue montelukast along with other nebs -HRCT on presentation showed no acute findings but there was a concern for tracheobronchomalacia.  Pulmonary recommends outpatient follow-up with pulmonary and may require referral to [facility] or [facility] for further evaluation of tracheobronchomalacia -Antibiotics has been switched to Augmentin as per pulmonary recommendations. -Still having significant cough  GERD -Continue PPI  Hypertension  -Blood pressure intermittently elevated.  Continue amlodipine, losartan and hydrochlorothiazide and Bumex  Leukocytosis -Resolved  Anemia of chronic disease -Questionable cause.  Hemoglobin stable.  Outpatient follow-up  Morbid obesity -Outpatient follow-up  Hyperlipidemia  -continue statin  DVT prophylaxis: Lovenox  code Status: Full Family Communication: None at bedside Disposition Plan: Status is: Inpatient  Remains inpatient appropriate because:Inpatient level of care appropriate due to severity of illness   Dispo: The  patient is from: Home              Anticipated d/c is to: Home in 1 to 2 days once clinically improves and once cleared by pulmonary              Patient currently is not medically stable to d/c.   Difficult to place patient No  Consultants: Pulmonary  Procedures: None  Antimicrobials: Zithromax   Subjective: Patient seen and examined at bedside.  Feels slightly better than yesterday but still complains of severe intermittent cough and some shortness of breath.  No overnight fever or vomiting reported.  Denies any chest pain. Objective: Vitals:   11/13/20 2040 11/13/20 2113 11/14/20 0440 11/14/20 0912  BP:  (!) 152/95 (!) 157/105 (!) 158/91  Pulse:  93 78 85  Resp: (!) 22 14 14 18   Temp:  99.3 F (37.4 C) 98.1 F (36.7 C)   TempSrc:  Oral Oral   SpO2:  100% 100% 100%  Weight:      Height:       No intake or output data in the 24 hours ending 11/14/20 1046 Filed Weights   11/11/20 0342  Weight: 131.5 kg    Examination:  General exam: No acute distress.  On room air currently.   Respiratory system: Decreased bilateral breath sounds at bases with scattered wheezing and intermittent tachypnea cardiovascular system: Intermittent tachycardia; S1-S2 heard Gastrointestinal system: Abdomen is morbidly obese, distended slightly, soft and nontender.  Bowel sounds are heard Extremities: Mild lower extremity edema present; no clubbing  Central nervous system: Awake and alert.  No focal neurological deficits.  Moves extremities  skin: No obvious ecchymosis/rashes Psychiatry: Looks more pleasant this morning with normal judgment and insight   Data Reviewed: I have personally reviewed following labs and imaging  studies  CBC: Recent Labs  Lab 11/11/20 0529 11/11/20 1613 11/12/20 0331  WBC 11.8* 10.0 10.5  NEUTROABS 9.2*  --   --   HGB 9.3* 9.8* 10.0*  HCT 30.2* 31.6* 33.0*  MCV 86.0 84.7 85.9  PLT 292 327 623   Basic Metabolic Panel: Recent Labs  Lab 11/11/20 0529  11/11/20 1613 11/12/20 0331  NA 139  --  140  K 3.7  --  4.0  CL 108  --  103  CO2 23  --  30  GLUCOSE 109*  --  143*  BUN 18  --  19  CREATININE 0.64 0.65 0.82  CALCIUM 9.1  --  9.2   GFR: Estimated Creatinine Clearance: 96.8 mL/min (by C-G formula based on SCr of 0.82 mg/dL). Liver Function Tests: Recent Labs  Lab 11/12/20 0331  AST 20  ALT 30  ALKPHOS 77  BILITOT 0.5  PROT 6.7  ALBUMIN 3.8   No results for input(s): LIPASE, AMYLASE in the last 168 hours. No results for input(s): AMMONIA in the last 168 hours. Coagulation Profile: No results for input(s): INR, PROTIME in the last 168 hours. Cardiac Enzymes: No results for input(s): CKTOTAL, CKMB, CKMBINDEX, TROPONINI in the last 168 hours. BNP (last 3 results) No results for input(s): PROBNP in the last 8760 hours. HbA1C: No results for input(s): HGBA1C in the last 72 hours. CBG: No results for input(s): GLUCAP in the last 168 hours. Lipid Profile: No results for input(s): CHOL, HDL, LDLCALC, TRIG, CHOLHDL, LDLDIRECT in the last 72 hours. Thyroid Function Tests: No results for input(s): TSH, T4TOTAL, FREET4, T3FREE, THYROIDAB in the last 72 hours. Anemia Panel: Recent Labs    11/11/20 1613  TIBC 367  IRON 83   Sepsis Labs: Recent Labs  Lab 11/13/20 0937  PROCALCITON <0.10    Recent Results (from the past 240 hour(s))  Resp Panel by RT-PCR (Flu A&B, Covid) Nasopharyngeal Swab     Status: None   Collection Time: 11/11/20  6:26 AM   Specimen: Nasopharyngeal Swab; Nasopharyngeal(NP) swabs in vial transport medium  Result Value Ref Range Status   SARS Coronavirus 2 by RT PCR NEGATIVE NEGATIVE Final    Comment: (NOTE) SARS-CoV-2 target nucleic acids are NOT DETECTED.  The SARS-CoV-2 RNA is generally detectable in upper respiratory specimens during the acute phase of infection. The lowest concentration of SARS-CoV-2 viral copies this assay can detect is 138 copies/mL. A negative result does not preclude  SARS-Cov-2 infection and should not be used as the sole basis for treatment or other patient management decisions. A negative result may occur with  improper specimen collection/handling, submission of specimen other than nasopharyngeal swab, presence of viral mutation(s) within the areas targeted by this assay, and inadequate number of viral copies(<138 copies/mL). A negative result must be combined with clinical observations, patient history, and epidemiological information. The expected result is Negative.  Fact Sheet for Patients:  EntrepreneurPulse.com.au  Fact Sheet for Healthcare Providers:  IncredibleEmployment.be  This test is no t yet approved or cleared by the [place] FDA and  has been authorized for detection and/or diagnosis of SARS-CoV-2 by FDA under an Emergency Use Authorization (EUA). This EUA will remain  in effect (meaning this test can be used) for the duration of the COVID-19 declaration under Section 564(b)(1) of the Act, 21 U.S.C.section 360bbb-3(b)(1), unless the authorization is terminated  or revoked sooner.       Influenza A by PCR NEGATIVE NEGATIVE Final   Influenza B by PCR  NEGATIVE NEGATIVE Final    Comment: (NOTE) The Xpert Xpress SARS-CoV-2/FLU/RSV plus assay is intended as an aid in the diagnosis of influenza from Nasopharyngeal swab specimens and should not be used as a sole basis for treatment. Nasal washings and aspirates are unacceptable for Xpert Xpress SARS-CoV-2/FLU/RSV testing.  Fact Sheet for Patients: EntrepreneurPulse.com.au  Fact Sheet for Healthcare Providers: IncredibleEmployment.be  This test is not yet approved or cleared by the [place] FDA and has been authorized for detection and/or diagnosis of SARS-CoV-2 by FDA under an Emergency Use Authorization (EUA). This EUA will remain in effect (meaning this test can be used) for the duration of  the COVID-19 declaration under Section 564(b)(1) of the Act, 21 U.S.C. section 360bbb-3(b)(1), unless the authorization is terminated or revoked.  Performed at [facility], [place], [place] [address] [place], [place] [address]   Expectorated Sputum Assessment w Gram Stain, Rflx to Resp Cult     Status: None   Collection Time: 11/12/20  9:49 AM   Specimen: Expectorated Sputum  Result Value Ref Range Status   Specimen Description EXPECTORATED SPUTUM  Final   Special Requests NONE  Final   Sputum evaluation   Final    THIS SPECIMEN IS ACCEPTABLE FOR SPUTUM CULTURE Performed at [facility], [place] [address], [place], [place] [address]    Report Status 11/13/2020 FINAL  Final  Culture, Respiratory w Gram Stain     Status: None (Preliminary result)   Collection Time: 11/13/20  9:49 AM  Result Value Ref Range Status   Specimen Description   Final    EXPECTORATED SPUTUM Performed at [place] [address] [place], [place] [address]    Special Requests   Final    NONE Reflexed from [id number] Performed at [facility], [place] [address] [place], [place] [address]    Gram Stain   Final    FEW WBC PRESENT,BOTH PMN AND MONONUCLEAR ABUNDANT GRAM POSITIVE COCCI IN CHAINS IN CLUSTERS FEW GRAM NEGATIVE RODS    Culture   Final    CULTURE REINCUBATED FOR BETTER GROWTH Performed at [facility], [place] [address], [place],  [address]    Report Status PENDING  Incomplete  Respiratory (~20 pathogens) panel by PCR     Status: Abnormal   Collection Time: 11/13/20  9:52 AM  Result Value Ref Range Status   Adenovirus NOT DETECTED NOT DETECTED Final   Coronavirus 229E NOT DETECTED NOT DETECTED Final    Comment: (NOTE) The Coronavirus on the Respiratory Panel, DOES NOT test for the novel  Coronavirus (2019 nCoV)    Coronavirus HKU1 NOT DETECTED NOT DETECTED Final   Coronavirus NL63 NOT DETECTED NOT DETECTED Final    Coronavirus OC43 NOT DETECTED NOT DETECTED Final   Metapneumovirus DETECTED (A) NOT DETECTED Final   Rhinovirus / Enterovirus NOT DETECTED NOT DETECTED Final   Influenza A NOT DETECTED NOT DETECTED Final   Influenza B NOT DETECTED NOT DETECTED Final   Parainfluenza Virus 1 NOT DETECTED NOT DETECTED Final   Parainfluenza Virus 2 NOT DETECTED NOT DETECTED Final   Parainfluenza Virus 3 NOT DETECTED NOT DETECTED Final   Parainfluenza Virus 4 NOT DETECTED NOT DETECTED Final   Respiratory Syncytial Virus NOT DETECTED NOT DETECTED Final   Bordetella pertussis NOT DETECTED NOT DETECTED Final   Bordetella Parapertussis NOT DETECTED NOT DETECTED Final   Chlamydophila pneumoniae NOT DETECTED NOT DETECTED Final   Mycoplasma pneumoniae NOT DETECTED NOT DETECTED Final  Comment: Performed at [facility], [place] [address] [place], [place] [address]         Radiology Studies: No results found.      Scheduled Meds: . amLODipine  5 mg Oral Daily  . amoxicillin-clavulanate  1 tablet Oral Q12H  . arformoterol  15 mcg Nebulization BID  . aspirin EC  81 mg Oral Daily  . atorvastatin  10 mg Oral Daily  . benzonatate  200 mg Oral TID  . bumetanide  2 mg Oral Daily  . cyclobenzaprine  10 mg Oral Daily  . enoxaparin (LOVENOX) injection  60 mg Subcutaneous Q24H  . guaiFENesin  600 mg Oral BID  . losartan  100 mg Oral Daily   And  . hydrochlorothiazide  25 mg Oral Daily  . loratadine  10 mg Oral QPM  . methylPREDNISolone (SOLU-MEDROL) injection  60 mg Intravenous Q6H  . montelukast  10 mg Oral QHS  . pantoprazole  40 mg Oral Daily  . revefenacin  175 mcg Nebulization Daily  . zolpidem  5 mg Oral QHS   Continuous Infusions:        Aline August, MD [facility] 11/14/2020, 10:46 AM

## 2020-11-14 NOTE — Progress Notes (Signed)
NAME:  Patty Howard, MRN:  [id number], DOB:  [birth year], LOS: 2 ADMISSION DATE:  11/11/2020, CONSULTATION DATE:  11/11/20 REFERRING MD:  Cherylann Ratel, DO CHIEF COMPLAINT:  Shortness of breath  History of Present Illness:  Patty Howard 65 year old woman former smoker with history of asthma, obstructive sleep apnea, GERD, and obesity who presents to the [facility] emergency department on 11/11/20 with shortness of breath, cough and wheezing.  She has been seen in pulmonary clinic in late April and 2 other times during the month of May.  She had influenza in April and was treated with Tamiflu and prednisone.  Since then she has had ongoing cough, wheezing and shortness of breath.  Despite changes in her inhaler therapy and nebulizer treatments along with multiple steroid tapers she reports her breathing is not improved.  Prior to her influenza infection she reports she had similar symptoms that would not respond significantly to steroids, inhaler therapy or breathing treatments. She was also [facility]. She does not report benefit since starting this medication.   She does report history of GERD that seems well controlled on pantoprazole 48m daily.   She is using CPAP therapy at night for OSA.   Pertinent  Medical History  Asthma OSA Hypertension  Significant Hospital Events: Including procedures, antibiotic start and stop dates in addition to other pertinent events   . 5/30 admitted . 6/1 augmentin started, changed from azithromycin  Interim History / Subjective:   She reports feeling better today. Still with productive cough and wheezing, but her breathing feels easier.  Objective   Blood pressure 136/86, pulse 63, temperature 98.1 F (36.7 C), temperature source Oral, resp. rate (!) 21, height 5' 8"  (1.727 m), weight 131.5 kg, SpO2 95 %.       No intake or output data in the 24 hours ending 11/14/20  1358 Filed Weights   11/11/20 0342  Weight: 131.5 kg    Examination: General: obese, no acute distress, sitting up in bed HENT: [place]/AT, moist mucous membranes, sclera anicteric Lungs: scattered wheezing bilaterally, improved air movement on left. Cardiovascular: rrr, no murmurs Abdomen: soft, non-tender, non-distended, BS+ Extremities: warm, no edema Neuro: alert, oriented, moving all extremities GU: deferred  Labs/imaging that I have personally reviewed  (right click and "Reselect all SmartList Selections" daily)   Echo 11/26/19: EF 65-70%. RV systolic function is normal. No valvular issues.  CXR 11/11/20: no consolidation or effusion. No pneumthorax.  CTA Chest 11/2019: Poor inspiratory effort with near collapse of her left and right main bronchi. Mild bibasilar atelectasis. Stable subcentimeter pulmonary nodules.    HRCT Chest 11/11/20: No acute findings noted. Scattered areas of scarring of distal airways similar to prior scans. Poor expiratory phase so difficult to compare to inspiratory phase for concern of tracheobronchomalacia. Pulmonary nodules are stable.  5/31: CBC, BMP reviewed  Autoimmune profile: negative ANA, ANCA, RF Normal immunoglobulin levels 10/31/19 (IgG, IgA, IgE, IgM) Negative aspergillus antibodies  No micro history of specific organisms in sputum  NM Stress trest normal 12/2019  Resolved Hospital Problem list     Assessment & Plan:   Asthma with acute exacerbation due to metapneumovirus   - Continue with treatment of acute asthma exacerbation with nebulizer treatments and IV solumedrol.  - Will plan to transition to oral prednisone in coming days - Continue augmentin for 7 day course - Discussed with patient the concern for TBM and that weight loss along with CPAP  therapy are her best treatment options. She may require referral to [facility] or [facility] for further evaluation of TBM and possible stent trial in the future. She will continue outpatient follow up  with Derl Barrow, NP and Dr. Ander Slade.  GERD - continue pantoprazole - elevate head of bed when sleeping  OSA - CPAP qhs  Obesity - weight loss recommended  Labs   CBC: Recent Labs  Lab 11/11/20 0529 11/11/20 1613 11/12/20 0331  WBC 11.8* 10.0 10.5  NEUTROABS 9.2*  --   --   HGB 9.3* 9.8* 10.0*  HCT 30.2* 31.6* 33.0*  MCV 86.0 84.7 85.9  PLT 292 327 161    Basic Metabolic Panel: Recent Labs  Lab 11/11/20 0529 11/11/20 1613 11/12/20 0331  NA 139  --  140  K 3.7  --  4.0  CL 108  --  103  CO2 23  --  30  GLUCOSE 109*  --  143*  BUN 18  --  19  CREATININE 0.64 0.65 0.82  CALCIUM 9.1  --  9.2   GFR: Estimated Creatinine Clearance: 96.8 mL/min (by C-G formula based on SCr of 0.82 mg/dL). Recent Labs  Lab 11/11/20 0529 11/11/20 1613 11/12/20 0331 11/13/20 0937  PROCALCITON  --   --   --  <0.10  WBC 11.8* 10.0 10.5  --     Liver Function Tests: Recent Labs  Lab 11/12/20 0331  AST 20  ALT 30  ALKPHOS 77  BILITOT 0.5  PROT 6.7  ALBUMIN 3.8   No results for input(s): LIPASE, AMYLASE in the last 168 hours. No results for input(s): AMMONIA in the last 168 hours.  ABG No results found for: PHART, PCO2ART, PO2ART, HCO3, TCO2, ACIDBASEDEF, O2SAT   Coagulation Profile: No results for input(s): INR, PROTIME in the last 168 hours.  Cardiac Enzymes: No results for input(s): CKTOTAL, CKMB, CKMBINDEX, TROPONINI in the last 168 hours.  HbA1C: Hgb A1c MFr Bld  Date/Time Value Ref Range Status  11/26/2019 01:55 AM 6.7 (H) 4.8 - 5.6 % Final    Comment:    RESULTS CONFIRMED BY MANUAL DILUTION (NOTE) Pre diabetes:          5.7%-6.4%  Diabetes:              >6.4%  Glycemic control for   <7.0% adults with diabetes   05/23/2019 06:22 AM 6.3 (H) 4.8 - 5.6 % Final    Comment:    (NOTE) Pre diabetes:          5.7%-6.4% Diabetes:              >6.4% Glycemic control for   <7.0% adults with diabetes     CBG: No results for input(s): GLUCAP in the last  168 hours.     Critical care time: n/a    Freda Jackson, MD [facility] Office: [phone]   See Amion for personal pager PCCM on call pager [phone] until 7pm. Please call Elink 7p-7a. [phone]

## 2020-11-15 ENCOUNTER — Telehealth: Payer: Self-pay | Admitting: Pulmonary Disease

## 2020-11-15 ENCOUNTER — Other Ambulatory Visit: Payer: PRIVATE HEALTH INSURANCE

## 2020-11-15 DIAGNOSIS — J4551 Severe persistent asthma with (acute) exacerbation: Principal | ICD-10-CM

## 2020-11-15 LAB — CULTURE, RESPIRATORY W GRAM STAIN: Culture: NORMAL

## 2020-11-15 MED ORDER — METHYLPREDNISOLONE SODIUM SUCC 125 MG IJ SOLR
60.0000 mg | Freq: Two times a day (BID) | INTRAMUSCULAR | Status: DC
  Administered 2020-11-15 – 2020-11-17 (×4): 60 mg via INTRAVENOUS
  Filled 2020-11-15 (×4): qty 2

## 2020-11-15 MED ORDER — ALBUTEROL SULFATE (2.5 MG/3ML) 0.083% IN NEBU
2.5000 mg | INHALATION_SOLUTION | Freq: Three times a day (TID) | RESPIRATORY_TRACT | Status: DC
  Administered 2020-11-15 – 2020-11-18 (×9): 2.5 mg via RESPIRATORY_TRACT
  Filled 2020-11-15 (×8): qty 3

## 2020-11-15 NOTE — Progress Notes (Signed)
NAME:  Patty Howard, MRN:  [id number], DOB:  [birth year], LOS: 3 ADMISSION DATE:  11/11/2020, CONSULTATION DATE:  11/11/20 REFERRING MD:  Cherylann Ratel, DO CHIEF COMPLAINT:  Shortness of breath  History of Present Illness:  Patty Howard 65 year old woman former smoker with history of asthma, obstructive sleep apnea, GERD, and obesity who presents to the [facility] emergency department on 11/11/20 with shortness of breath, cough and wheezing.  She has been seen in pulmonary clinic in late April and 2 other times during the month of May.  She had influenza in April and was treated with Tamiflu and prednisone.  Since then she has had ongoing cough, wheezing and shortness of breath.  Despite changes in her inhaler therapy and nebulizer treatments along with multiple steroid tapers she reports her breathing is not improved.  Prior to her influenza infection she reports she had similar symptoms that would not respond significantly to steroids, inhaler therapy or breathing treatments. She was also [facility]. She does not report benefit since starting this medication.   She does report history of GERD that seems well controlled on pantoprazole 26m daily.   She is using CPAP therapy at night for OSA.   Pertinent  Medical History  Asthma OSA Hypertension  Significant Hospital Events: Including procedures, antibiotic start and stop dates in addition to other pertinent events   . 5/30 admitted . 6/1 augmentin started, changed from azithromycin . 6/2 Extended viral panel positive for metapneumovirus  Interim History / Subjective:   She reports feeling better today. She has been up ambulating but gets short of breath and having to take breaks.  Objective   Blood pressure (!) 154/90, pulse 87, temperature 98.3 F (36.8 C), temperature source Oral, resp. rate 16, height 5' 8"  (1.727 m), weight 131.5 kg, SpO2 97 %.        No intake or output data in the 24 hours ending 11/15/20 1007 Filed Weights   11/11/20 0342  Weight: 131.5 kg    Examination: General: obese, no acute distress, sitting up in bed HENT: [place]/AT, moist mucous membranes, sclera anicteric Lungs: scattered wheezing bilaterally Cardiovascular: rrr, no murmurs Abdomen: soft, non-tender, non-distended, BS+ Extremities: warm, no edema Neuro: alert, oriented, moving all extremities GU: deferred  Labs/imaging that I have personally reviewed  (right click and "Reselect all SmartList Selections" daily)   Echo 11/26/19: EF 65-70%. RV systolic function is normal. No valvular issues.  CXR 11/11/20: no consolidation or effusion. No pneumthorax.  CTA Chest 11/2019: Poor inspiratory effort with near collapse of her left and right main bronchi. Mild bibasilar atelectasis. Stable subcentimeter pulmonary nodules.    HRCT Chest 11/11/20: No acute findings noted. Scattered areas of scarring of distal airways similar to prior scans. Poor expiratory phase so difficult to compare to inspiratory phase for concern of tracheobronchomalacia. Pulmonary nodules are stable.  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  - Will reduced IV solumedrol to q12hr dosing today - Will plan to transition to oral prednisone in  coming days. She can be transitioned to 15m prednisone daily for 3 days, then 460mprednisone for 3 days, 3050m 3 days, 1m92m3 days 10mg69m days then off -  Continue augmentin for 7 day course - Discussed with patient the concern for TBM and that weight loss along with CPAP therapy are her best treatment options. [facility] evaluation of TBM and possible stent trial in the future. She will continue outpatient follow up with Beth Derl [facility] Dr. OlaleAnder Sladehe will be safe for discharge when she feels her breathing isn't as labored when walking  GERD - continue pantoprazole - elevate head of bed when sleeping  OSA - CPAP qhs  Obesity - weight loss recommended  PCCM will sign off. Please call if any questions arise.   Labs   CBC: Recent Labs  Lab 11/11/20 0529 11/11/20 1613 11/12/20 0331  WBC 11.8* 10.0 10.5  NEUTROABS 9.2*  --   --   HGB 9.3* 9.8* 10.0*  HCT 30.2* 31.6* 33.0*  MCV 86.0 84.7 85.9  PLT 292 327 341  846asic Metabolic Panel: Recent Labs  Lab 11/11/20 0529 11/11/20 1613 11/12/20 0331  NA 139  --  140  K 3.7  --  4.0  CL 108  --  103  CO2 23  --  30  GLUCOSE 109*  --  143*  BUN 18  --  19  CREATININE 0.64 0.65 0.82  CALCIUM 9.1  --  9.2   GFR: Estimated Creatinine Clearance: 96.8 mL/min (by C-G formula based on SCr of 0.82 mg/dL). Recent Labs  Lab 11/11/20 0529 11/11/20 1613 11/12/20 0331 11/13/20 0937  PROCALCITON  --   --   --  <0.10  WBC 11.8* 10.0 10.5  --     Liver Function Tests: Recent Labs  Lab 11/12/20 0331  AST 20  ALT 30  ALKPHOS 77  BILITOT 0.5  PROT 6.7  ALBUMIN 3.8   No results for input(s): LIPASE, AMYLASE in the last 168 hours. No results for input(s): AMMONIA in the last 168 hours.  ABG No results found for: PHART, PCO2ART, PO2ART, HCO3, TCO2, ACIDBASEDEF, O2SAT   Coagulation Profile: No results for input(s): INR, PROTIME in the last 168 hours.  Cardiac Enzymes: No results for input(s): CKTOTAL, CKMB, CKMBINDEX, TROPONINI in the last 168 hours.  HbA1C: Hgb A1c MFr Bld  Date/Time Value Ref Range Status  11/26/2019 01:55  AM 6.7 (H) 4.8 - 5.6 % Final    Comment:    RESULTS CONFIRMED BY MANUAL DILUTION (NOTE) Pre diabetes:          5.7%-6.4%  Diabetes:              >6.4%  Glycemic control for   <7.0% adults with diabetes   05/23/2019 06:22 AM 6.3 (H) 4.8 - 5.6 % Final    Comment:    (NOTE) Pre diabetes:          5.7%-6.4% Diabetes:              >6.4% Glycemic control for   <7.0% adults with diabetes     CBG: No results for input(s): GLUCAP in the last 168 hours.     Critical care time: n/a    [facility] Office: [phone] Amion for personal pager PCCM on call pager [phone] 7pm. Please call Elink 7p-7a. [phone]

## 2020-11-15 NOTE — Care Management Important Message (Signed)
Important Message  Patient Details IM Letter given to the Patient Name: Patty Howard MRN: [id number] Date of Birth: [birth year]   Medicare Important Message Given:  Yes     Kerin Salen 11/15/2020, 10:25 AM

## 2020-11-15 NOTE — Telephone Encounter (Signed)
Please schedule hospital follow up with Beth in 7 to 10 days for asthma exacerbation.  Thanks, Wille Glaser

## 2020-11-15 NOTE — Telephone Encounter (Signed)
Patient is still currently admitted. She has been scheduled for a HFU with Beth on 11/22/20 at 230pm.

## 2020-11-15 NOTE — Progress Notes (Signed)
Patient ID: Patty Howard, female   DOB: 05/06/1956, 65 y.o.   MRN: [id number]  PROGRESS NOTE    Ardra Kuznicki  TMA:[id number] DOB: 04/12/1956 DOA: 11/11/2020 PCP: Buzzy Han, MD   Brief Narrative:  65 years old female with PMH significant for asthma, hypertension, anxiety presented in the ED with worsening shortness of breath for the last 6 weeks.  She was treated for flu as an outpatient recently and also treated with steroids and breathing treatments by her regular pulmonologist without resolution of symptoms.  On presentation to the ED, chest x-ray was negative for infiltrates; COVID was negative.  She was started on intravenous steroids.  Pulmonary was consulted.  Assessment & Plan:   Possible asthma exacerbation -There is a concern for tracheobronchomalacia as well -Pulmonary following.  Currently on IV Solu-Medrol 60 mg IV every 6 hours.  Will probably start tapering Solu-Medrol and start prednisone in the next 1 to 2 days.  Continue montelukast along with other nebs -HRCT on presentation showed no acute findings but there was a concern for tracheobronchomalacia.  Pulmonary recommends outpatient follow-up with pulmonary and may require referral to [facility] or [facility] for further evaluation of tracheobronchomalacia -Pulmonary recommends Augmentin for 7-day course. -Respiratory panel PCR was positive for metapneumovirus  GERD -Continue PPI  Hypertension  -Blood pressure intermittently elevated.  Continue amlodipine, losartan and hydrochlorothiazide and Bumex  Leukocytosis -Resolved  Anemia of chronic disease -Questionable cause.  Hemoglobin stable.  Outpatient follow-up  Morbid obesity -Outpatient follow-up  Hyperlipidemia  -continue statin  DVT prophylaxis: Lovenox  code Status: Full Family Communication: None at bedside Disposition Plan: Status is: Inpatient  Remains inpatient appropriate because:Inpatient level of care appropriate due to severity of  illness   Dispo: The patient is from: Home              Anticipated d/c is to: Home in 1 to 2 days once clinically improves and once cleared by pulmonary              Patient currently is not medically stable to d/c.   Difficult to place patient No  Consultants: Pulmonary  Procedures: None  Antimicrobials:  Anti-infectives (From admission, onward)   Start     Dose/Rate Route Frequency Ordered Stop   11/13/20 2000  amoxicillin-clavulanate (AUGMENTIN) 875-125 MG per tablet 1 tablet        1 tablet Oral Every 12 hours 11/13/20 1127 11/20/20 2159   11/13/20 1200  levofloxacin (LEVAQUIN) tablet 750 mg  Status:  Discontinued        750 mg Oral Daily 11/13/20 1037 11/13/20 1127   11/12/20 1000  azithromycin (ZITHROMAX) tablet 500 mg  Status:  Discontinued       "Followed by" Linked Group Details   500 mg Oral Daily 11/11/20 1523 11/13/20 1036   11/11/20 1800  azithromycin (ZITHROMAX) 500 mg in sodium chloride 0.9 % 250 mL IVPB       "Followed by" Linked Group Details   500 mg 250 mL/hr over 60 Minutes Intravenous Every 24 hours 11/11/20 1523 11/11/20 1916        Subjective: Patient seen and examined at bedside.  No overnight fever, vomiting or chest pain reported.  Still has significant cough with intermittent shortness of breath but improving slightly.  Does not feel ready to go home yet. Objective: Vitals:   11/14/20 1209 11/14/20 1950 11/14/20 2200 11/15/20 0540  BP: 136/86  139/88 (!) 154/90  Pulse: 63  (!) 108 87  Resp: (!) 21  15 16  Temp: 98.1 F (36.7 C)  98.6 F (37 C) 98.3 F (36.8 C)  TempSrc: Oral  Oral Oral  SpO2: 95% 98% 98% 96%  Weight:      Height:       No intake or output data in the 24 hours ending 11/15/20 0800 Filed Weights   11/11/20 0342  Weight: 131.5 kg    Examination:  General exam: Currently on room air.  No distress.  Respiratory system: Bilateral decreased breath sounds at bases with some scattered crackles and wheezing  cardiovascular  system: S1-S2 heard, intermittently tachycardic Gastrointestinal system: Abdomen is morbidly obese, mildly distended, soft and nontender.  Normal bowel sounds are heard extremities: No cyanosis; bilateral lower extremity trace edema present  Central nervous system: Alert and oriented.  No focal neurological deficits.  Moving extremities  skin: No obvious petechiae/lesions Psychiatry: Normal mood, affect and judgment  Data Reviewed: I have personally reviewed following labs and imaging studies  CBC: Recent Labs  Lab 11/11/20 0529 11/11/20 1613 11/12/20 0331  WBC 11.8* 10.0 10.5  NEUTROABS 9.2*  --   --   HGB 9.3* 9.8* 10.0*  HCT 30.2* 31.6* 33.0*  MCV 86.0 84.7 85.9  PLT 292 327 917   Basic Metabolic Panel: Recent Labs  Lab 11/11/20 0529 11/11/20 1613 11/12/20 0331  NA 139  --  140  K 3.7  --  4.0  CL 108  --  103  CO2 23  --  30  GLUCOSE 109*  --  143*  BUN 18  --  19  CREATININE 0.64 0.65 0.82  CALCIUM 9.1  --  9.2   GFR: Estimated Creatinine Clearance: 96.8 mL/min (by C-G formula based on SCr of 0.82 mg/dL). Liver Function Tests: Recent Labs  Lab 11/12/20 0331  AST 20  ALT 30  ALKPHOS 77  BILITOT 0.5  PROT 6.7  ALBUMIN 3.8   No results for input(s): LIPASE, AMYLASE in the last 168 hours. No results for input(s): AMMONIA in the last 168 hours. Coagulation Profile: No results for input(s): INR, PROTIME in the last 168 hours. Cardiac Enzymes: No results for input(s): CKTOTAL, CKMB, CKMBINDEX, TROPONINI in the last 168 hours. BNP (last 3 results) No results for input(s): PROBNP in the last 8760 hours. HbA1C: No results for input(s): HGBA1C in the last 72 hours. CBG: No results for input(s): GLUCAP in the last 168 hours. Lipid Profile: No results for input(s): CHOL, HDL, LDLCALC, TRIG, CHOLHDL, LDLDIRECT in the last 72 hours. Thyroid Function Tests: No results for input(s): TSH, T4TOTAL, FREET4, T3FREE, THYROIDAB in the last 72 hours. Anemia Panel: No  results for input(s): VITAMINB12, FOLATE, FERRITIN, TIBC, IRON, RETICCTPCT in the last 72 hours. Sepsis Labs: Recent Labs  Lab 11/13/20 0937  PROCALCITON <0.10    Recent Results (from the past 240 hour(s))  Resp Panel by RT-PCR (Flu A&B, Covid) Nasopharyngeal Swab     Status: None   Collection Time: 11/11/20  6:26 AM   Specimen: Nasopharyngeal Swab; Nasopharyngeal(NP) swabs in vial transport medium  Result Value Ref Range Status   SARS Coronavirus 2 by RT PCR NEGATIVE NEGATIVE Final    Comment: (NOTE) SARS-CoV-2 target nucleic acids are NOT DETECTED.  The SARS-CoV-2 RNA is generally detectable in upper respiratory specimens during the acute phase of infection. The lowest concentration of SARS-CoV-2 viral copies this assay can detect is 138 copies/mL. A negative result does not preclude SARS-Cov-2 infection and should not be used as the sole basis for treatment or  other patient management decisions. A negative result may occur with  improper specimen collection/handling, submission of specimen other than nasopharyngeal swab, presence of viral mutation(s) within the areas targeted by this assay, and inadequate number of viral copies(<138 copies/mL). A negative result must be combined with clinical observations, patient history, and epidemiological information. The expected result is Negative.  Fact Sheet for Patients:  EntrepreneurPulse.com.au  Fact Sheet for Healthcare Providers:  IncredibleEmployment.be  This test is no t yet approved or cleared by the [place] FDA and  has been authorized for detection and/or diagnosis of SARS-CoV-2 by FDA under an Emergency Use Authorization (EUA). This EUA will remain  in effect (meaning this test can be used) for the duration of the COVID-19 declaration under Section 564(b)(1) of the Act, 21 U.S.C.section 360bbb-3(b)(1), unless the authorization is terminated  or revoked sooner.       Influenza  A by PCR NEGATIVE NEGATIVE Final   Influenza B by PCR NEGATIVE NEGATIVE Final    Comment: (NOTE) The Xpert Xpress SARS-CoV-2/FLU/RSV plus assay is intended as an aid in the diagnosis of influenza from Nasopharyngeal swab specimens and should not be used as a sole basis for treatment. Nasal washings and aspirates are unacceptable for Xpert Xpress SARS-CoV-2/FLU/RSV testing.  Fact Sheet for Patients: EntrepreneurPulse.com.au  Fact Sheet for Healthcare Providers: IncredibleEmployment.be  This test is not yet approved or cleared by the [place] FDA and has been authorized for detection and/or diagnosis of SARS-CoV-2 by FDA under an Emergency Use Authorization (EUA). This EUA will remain in effect (meaning this test can be used) for the duration of the COVID-19 declaration under Section 564(b)(1) of the Act, 21 U.S.C. section 360bbb-3(b)(1), unless the authorization is terminated or revoked.  Performed at [facility], [place] [address], [place], [place] [address]   Expectorated Sputum Assessment w Gram Stain, Rflx to Resp Cult     Status: None   Collection Time: 11/12/20  9:49 AM   Specimen: Expectorated Sputum  Result Value Ref Range Status   Specimen Description EXPECTORATED SPUTUM  Final   Special Requests NONE  Final   Sputum evaluation   Final    THIS SPECIMEN IS ACCEPTABLE FOR SPUTUM CULTURE Performed at [facility], [place] [address] [place], [place] [address]    Report Status 11/13/2020 FINAL  Final  Culture, Respiratory w Gram Stain     Status: None (Preliminary result)   Collection Time: 11/13/20  9:49 AM  Result Value Ref Range Status   Specimen Description   Final    EXPECTORATED SPUTUM Performed at [place] [address], [place], [place] [address]    Special Requests   Final    NONE Reflexed from [id number] Performed at [facility], [place] [address], [place], [place] [address]    Gram Stain   Final    FEW WBC PRESENT,BOTH PMN AND MONONUCLEAR ABUNDANT GRAM POSITIVE COCCI IN CHAINS IN CLUSTERS FEW GRAM NEGATIVE RODS    Culture   Final    CULTURE REINCUBATED FOR BETTER GROWTH Performed at [facility], [place] [address] [place],  [address]    Report Status PENDING  Incomplete  Respiratory (~20 pathogens) panel by PCR     Status: Abnormal   Collection Time: 11/13/20  9:52 AM  Result Value Ref Range Status   Adenovirus NOT DETECTED NOT DETECTED Final   Coronavirus 229E NOT DETECTED NOT DETECTED Final    Comment: (NOTE) The Coronavirus on  the Respiratory Panel, DOES NOT test for the novel  Coronavirus (2019 nCoV)    Coronavirus HKU1 NOT DETECTED NOT DETECTED Final   Coronavirus NL63 NOT DETECTED NOT DETECTED Final   Coronavirus OC43 NOT DETECTED NOT DETECTED Final   Metapneumovirus DETECTED (A) NOT DETECTED Final   Rhinovirus / Enterovirus NOT DETECTED NOT DETECTED Final   Influenza A NOT DETECTED NOT DETECTED Final   Influenza B NOT DETECTED NOT DETECTED Final   Parainfluenza Virus 1 NOT DETECTED NOT DETECTED Final   Parainfluenza Virus 2 NOT DETECTED NOT DETECTED Final   Parainfluenza Virus 3 NOT DETECTED NOT DETECTED Final   Parainfluenza Virus 4 NOT DETECTED NOT DETECTED Final   Respiratory Syncytial Virus NOT DETECTED NOT DETECTED Final   Bordetella pertussis NOT DETECTED NOT DETECTED Final   Bordetella Parapertussis NOT DETECTED NOT DETECTED Final   Chlamydophila pneumoniae NOT DETECTED NOT DETECTED Final   Mycoplasma pneumoniae NOT DETECTED NOT DETECTED Final    Comment: Performed at [facility], [place] [address], [place], [place] [address]         Radiology Studies: No results found.      Scheduled Meds: . amLODipine  5 mg Oral Daily  . amoxicillin-clavulanate  1 tablet Oral Q12H  . arformoterol  15 mcg Nebulization BID  . aspirin EC  81 mg Oral Daily  . atorvastatin  10 mg Oral Daily  .  benzonatate  200 mg Oral TID  . bumetanide  2 mg Oral Daily  . cyclobenzaprine  10 mg Oral Daily  . enoxaparin (LOVENOX) injection  60 mg Subcutaneous Q24H  . guaiFENesin  1,200 mg Oral BID  . losartan  100 mg Oral Daily   And  . hydrochlorothiazide  25 mg Oral Daily  . loratadine  10 mg Oral QPM  . methylPREDNISolone (SOLU-MEDROL) injection  60 mg Intravenous Q6H  . montelukast  10 mg Oral QHS  . pantoprazole  40 mg Oral Daily  . revefenacin  175 mcg Nebulization Daily  . zolpidem  5 mg Oral QHS   Continuous Infusions:        Aline August, MD [facility] 11/15/2020, 8:00 AM

## 2020-11-16 MED ORDER — ALPRAZOLAM 0.25 MG PO TABS
0.2500 mg | ORAL_TABLET | Freq: Three times a day (TID) | ORAL | Status: DC | PRN
  Administered 2020-11-16 – 2020-11-17 (×3): 0.25 mg via ORAL
  Filled 2020-11-16 (×4): qty 1

## 2020-11-16 NOTE — Plan of Care (Signed)
  Problem: Education: Goal: Knowledge of General Education information will improve Description: Including pain rating scale, medication(s)/side effects and non-pharmacologic comfort measures Outcome: Progressing   Problem: Clinical Measurements: Goal: Respiratory complications will improve Outcome: Progressing   Problem: Activity: Goal: Risk for activity intolerance will decrease Outcome: Progressing   

## 2020-11-16 NOTE — Progress Notes (Signed)
Patient ID: Patty Howard, female   DOB: Dec 05, 1955, 65 y.o.   MRN: [id number]  PROGRESS NOTE    Patty Howard  PIR:[id number] DOB: 03/19/1956 DOA: 11/11/2020 PCP: Buzzy Han, MD   Brief Narrative:  65 years old female with PMH significant for asthma, hypertension, anxiety presented in the ED with worsening shortness of breath for the last 6 weeks.  She was treated for flu as an outpatient recently and also treated with steroids and breathing treatments by her regular pulmonologist without resolution of symptoms.  On presentation to the ED, chest x-ray was negative for infiltrates; COVID was negative.  She was started on intravenous steroids.  Pulmonary was consulted.  Assessment & Plan:   Possible asthma exacerbation -There is a concern for tracheobronchomalacia as well -Pulmonary signed off on 11/15/2020 with recommendations for outpatient follow-up and tapering off oral prednisone.  Continue Solu-Medrol 60 mg IV every 12 hours for today.  Continue montelukast along with other nebs -HRCT on presentation showed no acute findings but there was a concern for tracheobronchomalacia.  Pulmonary recommends outpatient follow-up with pulmonary and may require referral to [facility] or [facility] for further evaluation of tracheobronchomalacia -Pulmonary recommends Augmentin for 7-day course. -Respiratory panel PCR was positive for metapneumovirus  GERD -Continue PPI  Hypertension  -Blood pressure intermittently elevated.  Continue amlodipine, losartan and hydrochlorothiazide and Bumex  Leukocytosis -Resolved  Anemia of chronic disease -Questionable cause.  Hemoglobin stable.  Outpatient follow-up  Morbid obesity -Outpatient follow-up  Hyperlipidemia  -continue statin  Anxiety -Patient feels intermittently very anxious and crying and is requesting something for anxiety.  We will start low-dose Xanax 3 times daily as needed.  DVT prophylaxis: Lovenox  code Status:  Full Family Communication: None at bedside Disposition Plan: Status is: Inpatient  Remains inpatient appropriate because:Inpatient level of care appropriate due to severity of illness   Dispo: The patient is from: Home              Anticipated d/c is to: Home in 1 to 2 days once clinically improves and once cleared by pulmonary              Patient currently is not medically stable to d/c.   Difficult to place patient No  Consultants: Pulmonary  Procedures: None  Antimicrobials:  Anti-infectives (From admission, onward)   Start     Dose/Rate Route Frequency Ordered Stop   11/13/20 2000  amoxicillin-clavulanate (AUGMENTIN) 875-125 MG per tablet 1 tablet        1 tablet Oral Every 12 hours 11/13/20 1127 11/20/20 2159   11/13/20 1200  levofloxacin (LEVAQUIN) tablet 750 mg  Status:  Discontinued        750 mg Oral Daily 11/13/20 1037 11/13/20 1127   11/12/20 1000  azithromycin (ZITHROMAX) tablet 500 mg  Status:  Discontinued       "Followed by" Linked Group Details   500 mg Oral Daily 11/11/20 1523 11/13/20 1036   11/11/20 1800  azithromycin (ZITHROMAX) 500 mg in sodium chloride 0.9 % 250 mL IVPB       "Followed by" Linked Group Details   500 mg 250 mL/hr over 60 Minutes Intravenous Every 24 hours 11/11/20 1523 11/11/20 1916        Subjective: Patient seen and examined at bedside.  Feels slightly better but still short of breath with minimal exertion and has intermittent wheezing.  Feels intermittently very anxious.  Does not feel ready to go home today.  No overnight fever or vomiting reported.  Objective: Vitals:   11/15/20 1928 11/15/20 2240 11/16/20 0521 11/16/20 0720  BP:  (!) 157/104 (!) 166/97   Pulse: 91 92 72   Resp: 20 18 20    Temp:  98 F (36.7 C) 97.6 F (36.4 C)   TempSrc:  Oral Oral   SpO2: 99% 98% 96% 98%  Weight:      Height:        Intake/Output Summary (Last 24 hours) at 11/16/2020 0936 Last data filed at 11/15/2020 2200 Gross per 24 hour  Intake 480  ml  Output --  Net 480 ml   Filed Weights   11/11/20 0342  Weight: 131.5 kg    Examination:  General exam: No acute distress.  On room air currently.  Respiratory system: Decreased breath sounds at bases bilaterally with scattered wheezing diffusely  cardiovascular system: Rate controlled, S1-S2 heard Gastrointestinal system: Abdomen is morbidly obese, distended, soft and nontender.  Bowel sounds are heard extremities: Trace lower extremity edema present; no clubbing  Central nervous system: Awake and alert.  No focal neurological deficits.  Moves extremities  skin: No obvious ecchymosis/rashes Psychiatry: Anxious and intermittently crying.  Data Reviewed: I have personally reviewed following labs and imaging studies  CBC: Recent Labs  Lab 11/11/20 0529 11/11/20 1613 11/12/20 0331  WBC 11.8* 10.0 10.5  NEUTROABS 9.2*  --   --   HGB 9.3* 9.8* 10.0*  HCT 30.2* 31.6* 33.0*  MCV 86.0 84.7 85.9  PLT 292 327 315   Basic Metabolic Panel: Recent Labs  Lab 11/11/20 0529 11/11/20 1613 11/12/20 0331  NA 139  --  140  K 3.7  --  4.0  CL 108  --  103  CO2 23  --  30  GLUCOSE 109*  --  143*  BUN 18  --  19  CREATININE 0.64 0.65 0.82  CALCIUM 9.1  --  9.2   GFR: Estimated Creatinine Clearance: 96.8 mL/min (by C-G formula based on SCr of 0.82 mg/dL). Liver Function Tests: Recent Labs  Lab 11/12/20 0331  AST 20  ALT 30  ALKPHOS 77  BILITOT 0.5  PROT 6.7  ALBUMIN 3.8   No results for input(s): LIPASE, AMYLASE in the last 168 hours. No results for input(s): AMMONIA in the last 168 hours. Coagulation Profile: No results for input(s): INR, PROTIME in the last 168 hours. Cardiac Enzymes: No results for input(s): CKTOTAL, CKMB, CKMBINDEX, TROPONINI in the last 168 hours. BNP (last 3 results) No results for input(s): PROBNP in the last 8760 hours. HbA1C: No results for input(s): HGBA1C in the last 72 hours. CBG: No results for input(s): GLUCAP in the last 168  hours. Lipid Profile: No results for input(s): CHOL, HDL, LDLCALC, TRIG, CHOLHDL, LDLDIRECT in the last 72 hours. Thyroid Function Tests: No results for input(s): TSH, T4TOTAL, FREET4, T3FREE, THYROIDAB in the last 72 hours. Anemia Panel: No results for input(s): VITAMINB12, FOLATE, FERRITIN, TIBC, IRON, RETICCTPCT in the last 72 hours. Sepsis Labs: Recent Labs  Lab 11/13/20 0937  PROCALCITON <0.10    Recent Results (from the past 240 hour(s))  Resp Panel by RT-PCR (Flu A&B, Covid) Nasopharyngeal Swab     Status: None   Collection Time: 11/11/20  6:26 AM   Specimen: Nasopharyngeal Swab; Nasopharyngeal(NP) swabs in vial transport medium  Result Value Ref Range Status   SARS Coronavirus 2 by RT PCR NEGATIVE NEGATIVE Final    Comment: (NOTE) SARS-CoV-2 target nucleic acids are NOT DETECTED.  The SARS-CoV-2 RNA is generally detectable in upper  respiratory specimens during the acute phase of infection. The lowest concentration of SARS-CoV-2 viral copies this assay can detect is 138 copies/mL. A negative result does not preclude SARS-Cov-2 infection and should not be used as the sole basis for treatment or other patient management decisions. A negative result may occur with  improper specimen collection/handling, submission of specimen other than nasopharyngeal swab, presence of viral mutation(s) within the areas targeted by this assay, and inadequate number of viral copies(<138 copies/mL). A negative result must be combined with clinical observations, patient history, and epidemiological information. The expected result is Negative.  Fact Sheet for Patients:  EntrepreneurPulse.com.au  Fact Sheet for Healthcare Providers:  IncredibleEmployment.be  This test is no t yet approved or cleared by the [place] FDA and  has been authorized for detection and/or diagnosis of SARS-CoV-2 by FDA under an Emergency Use Authorization (EUA). This EUA  will remain  in effect (meaning this test can be used) for the duration of the COVID-19 declaration under Section 564(b)(1) of the Act, 21 U.S.C.section 360bbb-3(b)(1), unless the authorization is terminated  or revoked sooner.       Influenza A by PCR NEGATIVE NEGATIVE Final   Influenza B by PCR NEGATIVE NEGATIVE Final    Comment: (NOTE) The Xpert Xpress SARS-CoV-2/FLU/RSV plus assay is intended as an aid in the diagnosis of influenza from Nasopharyngeal swab specimens and should not be used as a sole basis for treatment. Nasal washings and aspirates are unacceptable for Xpert Xpress SARS-CoV-2/FLU/RSV testing.  Fact Sheet for Patients: EntrepreneurPulse.com.au  Fact Sheet for Healthcare Providers: IncredibleEmployment.be  This test is not yet approved or cleared by the [place] FDA and has been authorized for detection and/or diagnosis of SARS-CoV-2 by FDA under an Emergency Use Authorization (EUA). This EUA will remain in effect (meaning this test can be used) for the duration of the COVID-19 declaration under Section 564(b)(1) of the Act, 21 U.S.C. section 360bbb-3(b)(1), unless the authorization is terminated or revoked.  Performed at [facility], [place] [address], [place], [place] [address]   Expectorated Sputum Assessment w Gram Stain, Rflx to Resp Cult     Status: None   Collection Time: 11/12/20  9:49 AM   Specimen: Expectorated Sputum  Result Value Ref Range Status   Specimen Description EXPECTORATED SPUTUM  Final   Special Requests NONE  Final   Sputum evaluation   Final    THIS SPECIMEN IS ACCEPTABLE FOR SPUTUM CULTURE Performed at [facility], [place] [address], [place], [place] [address]    Report Status 11/13/2020 FINAL  Final  Culture, Respiratory w Gram Stain     Status: None   Collection Time: 11/13/20  9:49 AM  Result Value Ref Range Status   Specimen Description   Final     EXPECTORATED SPUTUM Performed at [facility], [place] [address], [place], [place] [address]    Special Requests   Final    NONE Reflexed from [id number] Performed at [facility], [place] [address], [place], [place] [address]    Gram Stain   Final    FEW WBC PRESENT,BOTH PMN AND MONONUCLEAR ABUNDANT GRAM POSITIVE COCCI IN CHAINS IN CLUSTERS FEW GRAM NEGATIVE RODS    Culture   Final    Normal respiratory flora-no Staph aureus or Pseudomonas seen Performed at [facility], [address], [place], [place] [address]    Report Status 11/15/2020 FINAL  Final  Respiratory (~20 pathogens) panel by PCR  Status: Abnormal   Collection Time: 11/13/20  9:52 AM  Result Value Ref Range Status   Adenovirus NOT DETECTED NOT DETECTED Final   Coronavirus 229E NOT DETECTED NOT DETECTED Final    Comment: (NOTE) The Coronavirus on the Respiratory Panel, DOES NOT test for the novel  Coronavirus (2019 nCoV)    Coronavirus HKU1 NOT DETECTED NOT DETECTED Final   Coronavirus NL63 NOT DETECTED NOT DETECTED Final   Coronavirus OC43 NOT DETECTED NOT DETECTED Final   Metapneumovirus DETECTED (A) NOT DETECTED Final   Rhinovirus / Enterovirus NOT DETECTED NOT DETECTED Final   Influenza A NOT DETECTED NOT DETECTED Final   Influenza B NOT DETECTED NOT DETECTED Final   Parainfluenza Virus 1 NOT DETECTED NOT DETECTED Final   Parainfluenza Virus 2 NOT DETECTED NOT DETECTED Final   Parainfluenza Virus 3 NOT DETECTED NOT DETECTED Final   Parainfluenza Virus 4 NOT DETECTED NOT DETECTED Final   Respiratory Syncytial Virus NOT DETECTED NOT DETECTED Final   Bordetella pertussis NOT DETECTED NOT DETECTED Final   Bordetella Parapertussis NOT DETECTED NOT DETECTED Final   Chlamydophila pneumoniae NOT DETECTED NOT DETECTED Final   Mycoplasma pneumoniae NOT DETECTED NOT DETECTED Final    Comment: Performed at [facility], [address] [place], [place] [address]          Radiology Studies: No results found.      Scheduled Meds: . albuterol  2.5 mg Nebulization TID  . amLODipine  5 mg Oral Daily  . amoxicillin-clavulanate  1 tablet Oral Q12H  . arformoterol  15 mcg Nebulization BID  . aspirin EC  81 mg Oral Daily  . atorvastatin  10 mg Oral Daily  . benzonatate  200 mg Oral TID  . bumetanide  2 mg Oral Daily  . cyclobenzaprine  10 mg Oral Daily  . enoxaparin (LOVENOX) injection  60 mg Subcutaneous Q24H  . guaiFENesin  1,200 mg Oral BID  . losartan  100 mg Oral Daily   And  . hydrochlorothiazide  25 mg Oral Daily  . loratadine  10 mg Oral QPM  . methylPREDNISolone (SOLU-MEDROL) injection  60 mg Intravenous Q12H  . montelukast  10 mg Oral QHS  . pantoprazole  40 mg Oral Daily  . revefenacin  175 mcg Nebulization Daily  . zolpidem  5 mg Oral QHS   Continuous Infusions:        Aline August, MD [facility] 11/16/2020, 9:36 AM

## 2020-11-16 NOTE — Plan of Care (Signed)
  Problem: Pain Managment: Goal: General experience of comfort will improve Outcome: Progressing   Problem: Safety: Goal: Ability to remain free from injury will improve Outcome: Progressing   

## 2020-11-16 NOTE — Progress Notes (Signed)
Pt like to place herself on the CPAP. Advised to let the RN know if she needs help.

## 2020-11-17 MED ORDER — POLYETHYLENE GLYCOL 3350 17 G PO PACK: 17.0000 g | PACK | Freq: Every day | ORAL | Status: DC | PRN

## 2020-11-17 MED ORDER — BISACODYL 10 MG RE SUPP: 10.0000 mg | Freq: Every day | RECTAL | Status: DC | PRN

## 2020-11-17 MED ORDER — SENNOSIDES-DOCUSATE SODIUM 8.6-50 MG PO TABS
1.0000 | ORAL_TABLET | Freq: Two times a day (BID) | ORAL | Status: DC
  Administered 2020-11-17 – 2020-11-18 (×2): 1 via ORAL
  Filled 2020-11-17 (×2): qty 1

## 2020-11-17 MED ORDER — METHYLPREDNISOLONE SODIUM SUCC 125 MG IJ SOLR
60.0000 mg | INTRAMUSCULAR | Status: DC
  Administered 2020-11-18: 60 mg via INTRAVENOUS
  Filled 2020-11-17: qty 2

## 2020-11-17 NOTE — Plan of Care (Signed)
  Problem: Pain Managment: Goal: General experience of comfort will improve Outcome: Progressing   Problem: Safety: Goal: Ability to remain free from injury will improve Outcome: Progressing   Problem: Coping: Goal: Level of anxiety will decrease Outcome: Progressing   

## 2020-11-17 NOTE — Progress Notes (Signed)
Patient ID: Patty Howard, female   DOB: 12/26/1955, 65 y.o.   MRN: [id number]  PROGRESS NOTE    Patty Howard  EHO:[id number] DOB: 08/23/1955 DOA: 11/11/2020 PCP: Buzzy Han, MD   Brief Narrative:  65 years old female with PMH significant for asthma, hypertension, anxiety presented in the ED with worsening shortness of breath for the last 6 weeks.  She was treated for flu as an outpatient recently and also treated with steroids and breathing treatments by her regular pulmonologist without resolution of symptoms.  On presentation to the ED, chest x-ray was negative for infiltrates; COVID was negative.  She was started on intravenous steroids.  Pulmonary was consulted.  Assessment & Plan:   Possible asthma exacerbation -There is a concern for tracheobronchomalacia as well -Pulmonary signed off on 11/15/2020 with recommendations for outpatient follow-up and tapering off oral prednisone.  Decrease Solu-Medrol to 60 mg IV daily.  Possibly switch to oral prednisone tomorrow.  Continue montelukast along with other nebs -HRCT on presentation showed no acute findings but there was a concern for tracheobronchomalacia.  Pulmonary recommends outpatient follow-up with pulmonary and may require referral to [facility] or [facility] for further evaluation of tracheobronchomalacia -Pulmonary recommends Augmentin for 7-day course. -Respiratory panel PCR was positive for metapneumovirus  GERD -Continue PPI  Hypertension  -Blood pressure intermittently elevated.  Continue amlodipine, losartan and hydrochlorothiazide and Bumex  Leukocytosis -Resolved  Anemia of chronic disease -Questionable cause.  Hemoglobin stable.  Outpatient follow-up  Morbid obesity -Outpatient follow-up  Hyperlipidemia  -continue statin  Anxiety -Patient feels intermittently very anxious and crying and is requesting something for anxiety.  We will start low-dose Xanax 3 times daily as needed.  DVT prophylaxis:  Lovenox  code Status: Full Family Communication: None at bedside Disposition Plan: Status is: Inpatient  Remains inpatient appropriate because:Inpatient level of care appropriate due to severity of illness   Dispo: The patient is from: Home              Anticipated d/c is to: Home in 1 to 2 days once clinically improves and once cleared by pulmonary              Patient currently is not medically stable to d/c.   Difficult to place patient No  Consultants: Pulmonary  Procedures: None  Antimicrobials:  Anti-infectives (From admission, onward)   Start     Dose/Rate Route Frequency Ordered Stop   11/13/20 2000  amoxicillin-clavulanate (AUGMENTIN) 875-125 MG per tablet 1 tablet        1 tablet Oral Every 12 hours 11/13/20 1127 11/20/20 2159   11/13/20 1200  levofloxacin (LEVAQUIN) tablet 750 mg  Status:  Discontinued        750 mg Oral Daily 11/13/20 1037 11/13/20 1127   11/12/20 1000  azithromycin (ZITHROMAX) tablet 500 mg  Status:  Discontinued       "Followed by" Linked Group Details   500 mg Oral Daily 11/11/20 1523 11/13/20 1036   11/11/20 1800  azithromycin (ZITHROMAX) 500 mg in sodium chloride 0.9 % 250 mL IVPB       "Followed by" Linked Group Details   500 mg 250 mL/hr over 60 Minutes Intravenous Every 24 hours 11/11/20 1523 11/11/20 1916        Subjective: Patient seen and examined at bedside.  Still coughing intermittently with exertional shortness of breath.  Feels slightly better but still feels anxious intermittently.  No overnight fever or vomiting reported. Objective: Vitals:   11/16/20 2150 11/16/20 2208 11/17/20 0518  11/17/20 0742  BP:  (!) 162/75 (!) 148/90   Pulse: 100 96 74   Resp: 18 20 16    Temp:  98.5 F (36.9 C) 98.6 F (37 C)   TempSrc:  Oral    SpO2: 98% 97% 94% 96%  Weight:      Height:        Intake/Output Summary (Last 24 hours) at 11/17/2020 0805 Last data filed at 11/16/2020 2208 Gross per 24 hour  Intake 840 ml  Output --  Net 840 ml    Filed Weights   11/11/20 0342  Weight: 131.5 kg    Examination:  General exam: Currently on room air.  No distress.   Respiratory system: Bilaterally decreased breath sounds at bases with some wheezing cardiovascular system: S1-S2 heard, rate controlled Gastrointestinal system: Abdomen is morbidly obese, slightly distended, soft and nontender.  Normal bowel sounds heard extremities: No cyanosis; mild lower extremity edema present  Data Reviewed: I have personally reviewed following labs and imaging studies  CBC: Recent Labs  Lab 11/11/20 0529 11/11/20 1613 11/12/20 0331  WBC 11.8* 10.0 10.5  NEUTROABS 9.2*  --   --   HGB 9.3* 9.8* 10.0*  HCT 30.2* 31.6* 33.0*  MCV 86.0 84.7 85.9  PLT 292 327 709   Basic Metabolic Panel: Recent Labs  Lab 11/11/20 0529 11/11/20 1613 11/12/20 0331  NA 139  --  140  K 3.7  --  4.0  CL 108  --  103  CO2 23  --  30  GLUCOSE 109*  --  143*  BUN 18  --  19  CREATININE 0.64 0.65 0.82  CALCIUM 9.1  --  9.2   GFR: Estimated Creatinine Clearance: 96.8 mL/min (by C-G formula based on SCr of 0.82 mg/dL). Liver Function Tests: Recent Labs  Lab 11/12/20 0331  AST 20  ALT 30  ALKPHOS 77  BILITOT 0.5  PROT 6.7  ALBUMIN 3.8   No results for input(s): LIPASE, AMYLASE in the last 168 hours. No results for input(s): AMMONIA in the last 168 hours. Coagulation Profile: No results for input(s): INR, PROTIME in the last 168 hours. Cardiac Enzymes: No results for input(s): CKTOTAL, CKMB, CKMBINDEX, TROPONINI in the last 168 hours. BNP (last 3 results) No results for input(s): PROBNP in the last 8760 hours. HbA1C: No results for input(s): HGBA1C in the last 72 hours. CBG: No results for input(s): GLUCAP in the last 168 hours. Lipid Profile: No results for input(s): CHOL, HDL, LDLCALC, TRIG, CHOLHDL, LDLDIRECT in the last 72 hours. Thyroid Function Tests: No results for input(s): TSH, T4TOTAL, FREET4, T3FREE, THYROIDAB in the last 72  hours. Anemia Panel: No results for input(s): VITAMINB12, FOLATE, FERRITIN, TIBC, IRON, RETICCTPCT in the last 72 hours. Sepsis Labs: Recent Labs  Lab 11/13/20 0937  PROCALCITON <0.10    Recent Results (from the past 240 hour(s))  Resp Panel by RT-PCR (Flu A&B, Covid) Nasopharyngeal Swab     Status: None   Collection Time: 11/11/20  6:26 AM   Specimen: Nasopharyngeal Swab; Nasopharyngeal(NP) swabs in vial transport medium  Result Value Ref Range Status   SARS Coronavirus 2 by RT PCR NEGATIVE NEGATIVE Final    Comment: (NOTE) SARS-CoV-2 target nucleic acids are NOT DETECTED.  The SARS-CoV-2 RNA is generally detectable in upper respiratory specimens during the acute phase of infection. The lowest concentration of SARS-CoV-2 viral copies this assay can detect is 138 copies/mL. A negative result does not preclude SARS-Cov-2 infection and should not be used as  the sole basis for treatment or other patient management decisions. A negative result may occur with  improper specimen collection/handling, submission of specimen other than nasopharyngeal swab, presence of viral mutation(s) within the areas targeted by this assay, and inadequate number of viral copies(<138 copies/mL). A negative result must be combined with clinical observations, patient history, and epidemiological information. The expected result is Negative.  Fact Sheet for Patients:  EntrepreneurPulse.com.au  Fact Sheet for Healthcare Providers:  IncredibleEmployment.be  This test is no t yet approved or cleared by the [place] FDA and  has been authorized for detection and/or diagnosis of SARS-CoV-2 by FDA under an Emergency Use Authorization (EUA). This EUA will remain  in effect (meaning this test can be used) for the duration of the COVID-19 declaration under Section 564(b)(1) of the Act, 21 U.S.C.section 360bbb-3(b)(1), unless the authorization is terminated  or revoked  sooner.       Influenza A by PCR NEGATIVE NEGATIVE Final   Influenza B by PCR NEGATIVE NEGATIVE Final    Comment: (NOTE) The Xpert Xpress SARS-CoV-2/FLU/RSV plus assay is intended as an aid in the diagnosis of influenza from Nasopharyngeal swab specimens and should not be used as a sole basis for treatment. Nasal washings and aspirates are unacceptable for Xpert Xpress SARS-CoV-2/FLU/RSV testing.  Fact Sheet for Patients: EntrepreneurPulse.com.au  Fact Sheet for Healthcare Providers: IncredibleEmployment.be  This test is not yet approved or cleared by the [place] FDA and has been authorized for detection and/or diagnosis of SARS-CoV-2 by FDA under an Emergency Use Authorization (EUA). This EUA will remain in effect (meaning this test can be used) for the duration of the COVID-19 declaration under Section 564(b)(1) of the Act, 21 U.S.C. section 360bbb-3(b)(1), unless the authorization is terminated or revoked.  Performed at [facility], [place] [address], [place], [place] [address]   Expectorated Sputum Assessment w Gram Stain, Rflx to Resp Cult     Status: None   Collection Time: 11/12/20  9:49 AM   Specimen: Expectorated Sputum  Result Value Ref Range Status   Specimen Description EXPECTORATED SPUTUM  Final   Special Requests NONE  Final   Sputum evaluation   Final    THIS SPECIMEN IS ACCEPTABLE FOR SPUTUM CULTURE Performed at [facility], [place] [address] [place], [place] [address]    Report Status 11/13/2020 FINAL  Final  Culture, Respiratory w Gram Stain     Status: None   Collection Time: 11/13/20  9:49 AM  Result Value Ref Range Status   Specimen Description   Final    EXPECTORATED SPUTUM Performed at [facility], [place] [address], [place], [place] [address]    Special Requests   Final    NONE Reflexed from [id number] Performed at [facility], [place]  [address] [place], [place] [address]    Gram Stain   Final    FEW WBC PRESENT,BOTH PMN AND MONONUCLEAR ABUNDANT GRAM POSITIVE COCCI IN CHAINS IN CLUSTERS FEW GRAM NEGATIVE RODS    Culture   Final    Normal respiratory flora-no Staph aureus or Pseudomonas seen Performed at [facility], [address] [place], [place] [address]    Report Status 11/15/2020 FINAL  Final  Respiratory (~20 pathogens) panel by PCR     Status: Abnormal   Collection Time: 11/13/20  9:52 AM  Result Value Ref Range Status   Adenovirus NOT DETECTED NOT DETECTED Final   Coronavirus 229E NOT DETECTED NOT DETECTED Final  Comment: (NOTE) The Coronavirus on the Respiratory Panel, DOES NOT test for the novel  Coronavirus (2019 nCoV)    Coronavirus HKU1 NOT DETECTED NOT DETECTED Final   Coronavirus NL63 NOT DETECTED NOT DETECTED Final   Coronavirus OC43 NOT DETECTED NOT DETECTED Final   Metapneumovirus DETECTED (A) NOT DETECTED Final   Rhinovirus / Enterovirus NOT DETECTED NOT DETECTED Final   Influenza A NOT DETECTED NOT DETECTED Final   Influenza B NOT DETECTED NOT DETECTED Final   Parainfluenza Virus 1 NOT DETECTED NOT DETECTED Final   Parainfluenza Virus 2 NOT DETECTED NOT DETECTED Final   Parainfluenza Virus 3 NOT DETECTED NOT DETECTED Final   Parainfluenza Virus 4 NOT DETECTED NOT DETECTED Final   Respiratory Syncytial Virus NOT DETECTED NOT DETECTED Final   Bordetella pertussis NOT DETECTED NOT DETECTED Final   Bordetella Parapertussis NOT DETECTED NOT DETECTED Final   Chlamydophila pneumoniae NOT DETECTED NOT DETECTED Final   Mycoplasma pneumoniae NOT DETECTED NOT DETECTED Final    Comment: Performed at [facility], [place] [address], [place], [place] [address]         Radiology Studies: No results found.      Scheduled Meds: . albuterol  2.5 mg Nebulization TID  . amLODipine  5 mg Oral Daily  . amoxicillin-clavulanate  1 tablet Oral Q12H  . arformoterol  15 mcg Nebulization  BID  . aspirin EC  81 mg Oral Daily  . atorvastatin  10 mg Oral Daily  . benzonatate  200 mg Oral TID  . bumetanide  2 mg Oral Daily  . cyclobenzaprine  10 mg Oral Daily  . enoxaparin (LOVENOX) injection  60 mg Subcutaneous Q24H  . guaiFENesin  1,200 mg Oral BID  . losartan  100 mg Oral Daily   And  . hydrochlorothiazide  25 mg Oral Daily  . loratadine  10 mg Oral QPM  . methylPREDNISolone (SOLU-MEDROL) injection  60 mg Intravenous Q12H  . montelukast  10 mg Oral QHS  . pantoprazole  40 mg Oral Daily  . revefenacin  175 mcg Nebulization Daily  . zolpidem  5 mg Oral QHS   Continuous Infusions:        Aline August, MD [facility] 11/17/2020, 8:05 AM

## 2020-11-17 NOTE — Plan of Care (Signed)
  Problem: Education: Goal: Knowledge of General Education information will improve Description Including pain rating scale, medication(s)/side effects and non-pharmacologic comfort measures Outcome: Progressing   

## 2020-11-18 LAB — CREATININE, SERUM
Creatinine, Ser: 0.99 mg/dL (ref 0.44–1.00)
GFR, Estimated: >60 mL/min (ref >60)

## 2020-11-18 MED ORDER — AMOXICILLIN-POT CLAVULANATE 875-125 MG PO TABS: 1.0000 | ORAL_TABLET | Freq: Two times a day (BID) | ORAL | 0 refills | Status: AC

## 2020-11-18 MED ORDER — BENZONATATE 200 MG PO CAPS: 200.0000 mg | ORAL_CAPSULE | Freq: Three times a day (TID) | ORAL | 0 refills | Status: DC

## 2020-11-18 MED ORDER — GUAIFENESIN-CODEINE 100-10 MG/5ML PO SOLN: 5.0000 mL | ORAL | 0 refills | Status: DC | PRN

## 2020-11-18 MED ORDER — GUAIFENESIN ER 600 MG PO TB12: 1200.0000 mg | ORAL_TABLET | Freq: Two times a day (BID) | ORAL | 0 refills | Status: AC

## 2020-11-18 MED ORDER — PREDNISONE 10 MG PO TABS: ORAL_TABLET | ORAL | 0 refills | Status: DC

## 2020-11-18 NOTE — Discharge Summary (Signed)
Physician Discharge Summary  Patty Howard SWH:[id number] DOB: 1955-11-24 DOA: 11/11/2020  PCP: Buzzy Han, MD  Admit date: 11/11/2020 Discharge date: 11/18/2020  Admitted From: Home Disposition: Home  Recommendations for Outpatient Follow-up:  1. Follow up with PCP in 1 week with repeat CBC/BMP 2. Outpatient follow-up with pulmonary 3. Follow up in ED if symptoms worsen or new appear   Home Health: No Equipment/Devices: None  Discharge Condition: Stable CODE STATUS: Full Diet recommendation: Heart healthy  Brief/Interim Summary: 64 years old female with PMH significant for asthma, hypertension, anxiety presented in the ED with worsening shortness of breath for the last 6 weeks.  She was treated for flu as an outpatient recently and also treated with steroids and breathing treatments by her regular pulmonologist without resolution of symptoms.  On presentation to the ED, chest x-ray was negative for infiltrates; COVID was negative.  She was started on intravenous steroids.  Pulmonary was consulted.  She had a long hospitalization.  Steroids are being gradually tapered.  She is currently on room air and feels okay going home.  She will be discharged home on tapering prednisone as per pulmonary recommendations.  Outpatient follow-up with pulmonary.  Discharge Diagnoses:   Possible asthma exacerbation -There is a concern for tracheobronchomalacia as well -Pulmonary signed off on 11/15/2020 with recommendations for outpatient follow-up and tapering off oral prednisone.    Currently on tapering doses of IV Solu-Medrol. Continue montelukast along with home regimen. -HRCT on presentation showed no acute findings but there was a concern for tracheobronchomalacia.  Pulmonary recommends outpatient follow-up with pulmonary and may require referral to [facility] or [facility] for further evaluation of tracheobronchomalacia -Pulmonary recommends Augmentin for 7-day course. -Respiratory panel PCR  was positive for metapneumovirus -Respirate status stable and currently on room air although patient still has intermittent cough.  He is okay to go home.  Discharge home on prednisone 60 mg daily for 3 days then 40 mg daily for 3 days then 30 mg daily for 3 days then 20 mg daily for 3 days then 10 mg daily for 3 days and then stop and follow-up with pulmonary.  This is as per pulmonary recommendations.  GERD -Continue PPI  Hypertension  -Blood pressure intermittently elevated.  Continue home regimen.  Leukocytosis -Resolved  Anemia of chronic disease -Questionable cause.  Hemoglobin stable.  Outpatient follow-up  Morbid obesity -Outpatient follow-up  Hyperlipidemia  -continue statin  Anxiety -Patient was treated with low-dose of Xanax while in the hospital.  Will need to follow-up with PCP regarding continuation of the same or not.  Discharge Instructions  Discharge Instructions    Ambulatory referral to Pulmonology   Complete by: As directed    Reason for referral: Asthma/COPD   Diet - low sodium heart healthy   Complete by: As directed    Increase activity slowly   Complete by: As directed      Allergies as of 11/18/2020      Reactions   Banana Hives, Itching      Medication List    STOP taking these medications   HYDROcodone-acetaminophen 5-325 MG tablet Commonly known as: NORCO/VICODIN   ibuprofen 800 MG tablet Commonly known as: ADVIL   ondansetron 4 MG disintegrating tablet Commonly known as: Zofran ODT   oxyCODONE-acetaminophen 5-325 MG tablet Commonly known as: PERCOCET/ROXICET     TAKE these medications   albuterol 108 (90 Base) MCG/ACT inhaler Commonly known as: VENTOLIN HFA Inhale 1-2 puffs into the lungs every 6 (six) hours as needed for  wheezing or shortness of breath.   amLODipine 5 MG tablet Commonly known as: NORVASC TAKE 1 TABLET (5 MG TOTAL) BY MOUTH DAILY.   amoxicillin-clavulanate 875-125 MG tablet Commonly known as:  AUGMENTIN Take 1 tablet by mouth every 12 (twelve) hours for 2 days.   ascorbic acid 500 MG tablet Commonly known as: VITAMIN C Take 500 mg by mouth daily.   aspirin EC 81 MG tablet Take 81 mg by mouth daily. Swallow whole.   atorvastatin 10 MG tablet Commonly known as: LIPITOR Take 10 mg by mouth daily.   benzonatate 200 MG capsule Commonly known as: TESSALON Take 1 capsule (200 mg total) by mouth 3 (three) times daily.   bumetanide 2 MG tablet Commonly known as: BUMEX Take 2 mg by mouth daily.   cyclobenzaprine 10 MG tablet Commonly known as: FLEXERIL Take 10 mg by mouth daily.   Diethylpropion HCl CR 75 MG Tb24 Take 1 tablet by mouth every morning.   furosemide 40 MG tablet Commonly known as: LASIX Take 1 tablet (40 mg total) by mouth daily as needed for fluid or edema (WEIGHT GAIN OF 2LBS, SHORTNESS OF BREATH). What changed: Another medication with the same name was removed. Continue taking this medication, and follow the directions you see here.   guaiFENesin 600 MG 12 hr tablet Commonly known as: MUCINEX Take 2 tablets (1,200 mg total) by mouth 2 (two) times daily for 7 days.   guaiFENesin-codeine 100-10 MG/5ML syrup Take 5 mLs by mouth every 4 (four) hours as needed for cough.   ipratropium-albuterol 0.5-2.5 (3) MG/3ML Soln Commonly known as: DUONEB Take 3 mLs by nebulization every 6 (six) hours as needed. What changed: reasons to take this   levocetirizine 5 MG tablet Commonly known as: XYZAL Take 1 tablet (5 mg total) by mouth every evening.   losartan-hydrochlorothiazide 100-25 MG tablet Commonly known as: HYZAAR Take 1 tablet by mouth daily.   montelukast 10 MG tablet Commonly known as: SINGULAIR Take 1 tablet (10 mg total) by mouth at bedtime.   multivitamin with minerals Tabs tablet Take 1 tablet by mouth daily. Centrum Multivitamin   pantoprazole 40 MG tablet Commonly known as: PROTONIX TAKE 1 TABLET BY MOUTH EVERY DAY   predniSONE 10 MG  tablet Commonly known as: DELTASONE Prednisone 60 mg daily for 3 days, then 40 mg daily for 3 days, then 30 mg daily for 3 days, then 20 mg daily for 3 days then 10 mg daily for 3 days then stop and follow-up with your lung specialist   Trelegy Ellipta 200-62.5-25 MCG/INH Aepb Generic drug: Fluticasone-Umeclidin-Vilant Inhale 1 puff into the lungs daily.   Vitamin D (Ergocalciferol) 1.25 MG (50000 UNIT) Caps capsule Commonly known as: DRISDOL Take 50,000 Units by mouth every Monday.   zolpidem 10 MG tablet Commonly known as: AMBIEN Take 10 mg by mouth at bedtime.       Follow-up Information    Buzzy Han, MD. Schedule an appointment as soon as possible for a visit in 1 week(s).   Specialty: Family Medicine Contact information: [place] [address] [phone]              Allergies  Allergen Reactions  . Banana Hives and Itching    Consultations:  Pulmonary   Procedures/Studies: CT Chest High Resolution  Result Date: 11/11/2020 CLINICAL DATA:  66 year old female with history of severe asthma. Shortness of breath for the past 6 weeks. EXAM: CT CHEST WITHOUT CONTRAST TECHNIQUE: Multidetector CT imaging of the  chest was performed following the standard protocol without intravenous contrast. High resolution imaging of the lungs, as well as inspiratory and expiratory imaging, was performed. COMPARISON:  Chest CT 11/26/2019. FINDINGS: Cardiovascular: Heart size is mildly enlarged. There is no significant pericardial fluid, thickening or pericardial calcification. Aortic atherosclerosis. No definite coronary artery calcifications. Mediastinum/Nodes: No pathologically enlarged mediastinal or hilar lymph nodes. Please note that accurate exclusion of hilar adenopathy is limited on noncontrast CT scans. Esophagus is unremarkable in appearance. No axillary lymphadenopathy. Lungs/Pleura: There are a few scattered areas of mild architectural  distortion, most compatible with areas of chronic post infectious or inflammatory scarring, most evident in the apex of the left upper lobe and in the right middle lobe. High-resolution images otherwise demonstrate no generalized regions of ground-glass attenuation, septal thickening, subpleural reticulation, traction bronchiectasis or honeycombing to indicate interstitial lung disease. Inspiratory and expiratory imaging is unremarkable. No acute consolidative airspace disease. No pleural effusions. A few scattered tiny pulmonary nodules are noted measuring 2-4 mm in size stable in size and number compared to the prior examination from 11/26/2019, considered definitively benign. No larger more suspicious appearing pulmonary nodules or masses are noted. Upper Abdomen: LapBand in position. Musculoskeletal: There are no aggressive appearing lytic or blastic lesions noted in the visualized portions of the skeleton. IMPRESSION: 1. No findings to suggest interstitial lung disease. No acute findings in the thorax. Scattered areas of mild post infectious or inflammatory scarring in the lungs, as above. 2. Small pulmonary nodules measuring 2-4 mm in size, stable compared to the prior study, considered definitively benign. 3. Aortic atherosclerosis. 4. Mild cardiomegaly. Aortic Atherosclerosis (ICD10-I70.0). Electronically Signed   By: Vinnie Langton M.D.   On: 11/11/2020 09:57   DG Chest Port 1 View  Result Date: 11/11/2020 CLINICAL DATA:  Cough and shortness of breath EXAM: PORTABLE CHEST 1 VIEW COMPARISON:  10/10/2020 FINDINGS: Borderline heart size accentuated by technique. Stable mediastinal contours. There is no edema, consolidation, effusion, or pneumothorax. Artifact from EKG leads. IMPRESSION: No acute or interval finding. Electronically Signed   By: Monte Fantasia M.D.   On: 11/11/2020 04:50       Subjective: Patient seen and examined at bedside.  Feels okay enough to go home today.  Still has  intermittent cough.  No overnight fever, chest pain reported.  Discharge Exam: Vitals:   11/17/20 2217 11/18/20 0534  BP: (!) 164/121 140/77  Pulse: 100 76  Resp: 16 16  Temp: 98.4 F (36.9 C) 98.4 F (36.9 C)  SpO2: 96% 98%    General: Pt is alert, awake, not in acute distress.  Currently on room air. Cardiovascular: rate controlled, S1/S2 + Respiratory: bilateral decreased breath sounds at bases with scattered crackles and some wheezing  abdominal: Soft, morbidly obese, NT, ND, bowel sounds + Extremities: Trace lower extremity edema; no cyanosis    The results of significant diagnostics from this hospitalization (including imaging, microbiology, ancillary and laboratory) are listed below for reference.     Microbiology: Recent Results (from the past 240 hour(s))  Resp Panel by RT-PCR (Flu A&B, Covid) Nasopharyngeal Swab     Status: None   Collection Time: 11/11/20  6:26 AM   Specimen: Nasopharyngeal Swab; Nasopharyngeal(NP) swabs in vial transport medium  Result Value Ref Range Status   SARS Coronavirus 2 by RT PCR NEGATIVE NEGATIVE Final    Comment: (NOTE) SARS-CoV-2 target nucleic acids are NOT DETECTED.  The SARS-CoV-2 RNA is generally detectable in upper respiratory specimens during the acute phase of infection.  The lowest concentration of SARS-CoV-2 viral copies this assay can detect is 138 copies/mL. A negative result does not preclude SARS-Cov-2 infection and should not be used as the sole basis for treatment or other patient management decisions. A negative result may occur with  improper specimen collection/handling, submission of specimen other than nasopharyngeal swab, presence of viral mutation(s) within the areas targeted by this assay, and inadequate number of viral copies(<138 copies/mL). A negative result must be combined with clinical observations, patient history, and epidemiological information. The expected result is Negative.  Fact Sheet for  Patients:  EntrepreneurPulse.com.au  Fact Sheet for Healthcare Providers:  IncredibleEmployment.be  This test is no t yet approved or cleared by the [place] FDA and  has been authorized for detection and/or diagnosis of SARS-CoV-2 by FDA under an Emergency Use Authorization (EUA). This EUA will remain  in effect (meaning this test can be used) for the duration of the COVID-19 declaration under Section 564(b)(1) of the Act, 21 U.S.C.section 360bbb-3(b)(1), unless the authorization is terminated  or revoked sooner.       Influenza A by PCR NEGATIVE NEGATIVE Final   Influenza B by PCR NEGATIVE NEGATIVE Final    Comment: (NOTE) The Xpert Xpress SARS-CoV-2/FLU/RSV plus assay is intended as an aid in the diagnosis of influenza from Nasopharyngeal swab specimens and should not be used as a sole basis for treatment. Nasal washings and aspirates are unacceptable for Xpert Xpress SARS-CoV-2/FLU/RSV testing.  Fact Sheet for Patients: EntrepreneurPulse.com.au  Fact Sheet for Healthcare Providers: IncredibleEmployment.be  This test is not yet approved or cleared by the [place] FDA and has been authorized for detection and/or diagnosis of SARS-CoV-2 by FDA under an Emergency Use Authorization (EUA). This EUA will remain in effect (meaning this test can be used) for the duration of the COVID-19 declaration under Section 564(b)(1) of the Act, 21 U.S.C. section 360bbb-3(b)(1), unless the authorization is terminated or revoked.  Performed at [facility], [place] [address], [place], [place] [address]   Expectorated Sputum Assessment w Gram Stain, Rflx to Resp Cult     Status: None   Collection Time: 11/12/20  9:49 AM   Specimen: Expectorated Sputum  Result Value Ref Range Status   Specimen Description EXPECTORATED SPUTUM  Final   Special Requests NONE  Final   Sputum evaluation   Final     THIS SPECIMEN IS ACCEPTABLE FOR SPUTUM CULTURE Performed at [facility], [place] [address] [place], [place] [address]    Report Status 11/13/2020 FINAL  Final  Culture, Respiratory w Gram Stain     Status: None   Collection Time: 11/13/20  9:49 AM  Result Value Ref Range Status   Specimen Description   Final    EXPECTORATED SPUTUM Performed at [facility], [place] [address], [place], [place] [address]    Special Requests   Final    NONE Reflexed from [id number] Performed at [facility], [place] [address] [place], [place] [address]    Gram Stain   Final    FEW WBC PRESENT,BOTH PMN AND MONONUCLEAR ABUNDANT GRAM POSITIVE COCCI IN CHAINS IN CLUSTERS FEW GRAM NEGATIVE RODS    Culture   Final    Normal respiratory flora-no Staph aureus or Pseudomonas seen Performed at [facility], [address] [place], [place] [address]    Report Status 11/15/2020 FINAL  Final  Respiratory (~20 pathogens) panel by PCR     Status: Abnormal   Collection  Time: 11/13/20  9:52 AM  Result Value Ref Range Status   Adenovirus NOT DETECTED NOT DETECTED Final   Coronavirus 229E NOT DETECTED NOT DETECTED Final    Comment: (NOTE) The Coronavirus on the Respiratory Panel, DOES NOT test for the novel  Coronavirus (2019 nCoV)    Coronavirus HKU1 NOT DETECTED NOT DETECTED Final   Coronavirus NL63 NOT DETECTED NOT DETECTED Final   Coronavirus OC43 NOT DETECTED NOT DETECTED Final   Metapneumovirus DETECTED (A) NOT DETECTED Final   Rhinovirus / Enterovirus NOT DETECTED NOT DETECTED Final   Influenza A NOT DETECTED NOT DETECTED Final   Influenza B NOT DETECTED NOT DETECTED Final   Parainfluenza Virus 1 NOT DETECTED NOT DETECTED Final   Parainfluenza Virus 2 NOT DETECTED NOT DETECTED Final   Parainfluenza Virus 3 NOT DETECTED NOT DETECTED Final   Parainfluenza Virus 4 NOT DETECTED NOT DETECTED Final   Respiratory Syncytial Virus NOT DETECTED NOT DETECTED  Final   Bordetella pertussis NOT DETECTED NOT DETECTED Final   Bordetella Parapertussis NOT DETECTED NOT DETECTED Final   Chlamydophila pneumoniae NOT DETECTED NOT DETECTED Final   Mycoplasma pneumoniae NOT DETECTED NOT DETECTED Final    Comment: Performed at [facility], [address], [place], [place] [address]     Labs: BNP (last 3 results) Recent Labs    11/26/19 0155 11/27/19 0413  BNP 51.3 94.8   Basic Metabolic Panel: Recent Labs  Lab 11/11/20 1613 11/12/20 0331 11/18/20 0311  NA  --  140  --   K  --  4.0  --   CL  --  103  --   CO2  --  30  --   GLUCOSE  --  143*  --   BUN  --  19  --   CREATININE 0.65 0.82 0.99  CALCIUM  --  9.2  --    Liver Function Tests: Recent Labs  Lab 11/12/20 0331  AST 20  ALT 30  ALKPHOS 77  BILITOT 0.5  PROT 6.7  ALBUMIN 3.8   No results for input(s): LIPASE, AMYLASE in the last 168 hours. No results for input(s): AMMONIA in the last 168 hours. CBC: Recent Labs  Lab 11/11/20 1613 11/12/20 0331  WBC 10.0 10.5  HGB 9.8* 10.0*  HCT 31.6* 33.0*  MCV 84.7 85.9  PLT 327 341   Cardiac Enzymes: No results for input(s): CKTOTAL, CKMB, CKMBINDEX, TROPONINI in the last 168 hours. BNP: Invalid input(s): POCBNP CBG: No results for input(s): GLUCAP in the last 168 hours. D-Dimer No results for input(s): DDIMER in the last 72 hours. Hgb A1c No results for input(s): HGBA1C in the last 72 hours. Lipid Profile No results for input(s): CHOL, HDL, LDLCALC, TRIG, CHOLHDL, LDLDIRECT in the last 72 hours. Thyroid function studies No results for input(s): TSH, T4TOTAL, T3FREE, THYROIDAB in the last 72 hours.  Invalid input(s): FREET3 Anemia work up No results for input(s): VITAMINB12, FOLATE, FERRITIN, TIBC, IRON, RETICCTPCT in the last 72 hours. Urinalysis    Component Value Date/Time   COLORURINE YELLOW 09/30/2020 0242   APPEARANCEUR CLEAR 09/30/2020 0242   LABSPEC 1.013 09/30/2020 0242   PHURINE 6.0 09/30/2020 0242    GLUCOSEU NEGATIVE 09/30/2020 0242   HGBUR NEGATIVE 09/30/2020 0242   BILIRUBINUR NEGATIVE 09/30/2020 0242   KETONESUR NEGATIVE 09/30/2020 0242   PROTEINUR NEGATIVE 09/30/2020 0242   NITRITE NEGATIVE 09/30/2020 0242   LEUKOCYTESUR NEGATIVE 09/30/2020 0242   Sepsis Labs Invalid input(s): PROCALCITONIN,  WBC,  LACTICIDVEN Microbiology Recent  Results (from the past 240 hour(s))  Resp Panel by RT-PCR (Flu A&B, Covid) Nasopharyngeal Swab     Status: None   Collection Time: 11/11/20  6:26 AM   Specimen: Nasopharyngeal Swab; Nasopharyngeal(NP) swabs in vial transport medium  Result Value Ref Range Status   SARS Coronavirus 2 by RT PCR NEGATIVE NEGATIVE Final    Comment: (NOTE) SARS-CoV-2 target nucleic acids are NOT DETECTED.  The SARS-CoV-2 RNA is generally detectable in upper respiratory specimens during the acute phase of infection. The lowest concentration of SARS-CoV-2 viral copies this assay can detect is 138 copies/mL. A negative result does not preclude SARS-Cov-2 infection and should not be used as the sole basis for treatment or other patient management decisions. A negative result may occur with  improper specimen collection/handling, submission of specimen other than nasopharyngeal swab, presence of viral mutation(s) within the areas targeted by this assay, and inadequate number of viral copies(<138 copies/mL). A negative result must be combined with clinical observations, patient history, and epidemiological information. The expected result is Negative.  Fact Sheet for Patients:  EntrepreneurPulse.com.au  Fact Sheet for Healthcare Providers:  IncredibleEmployment.be  This test is no t yet approved or cleared by the [place] FDA and  has been authorized for detection and/or diagnosis of SARS-CoV-2 by FDA under an Emergency Use Authorization (EUA). This EUA will remain  in effect (meaning this test can be used) for the duration  of the COVID-19 declaration under Section 564(b)(1) of the Act, 21 U.S.C.section 360bbb-3(b)(1), unless the authorization is terminated  or revoked sooner.       Influenza A by PCR NEGATIVE NEGATIVE Final   Influenza B by PCR NEGATIVE NEGATIVE Final    Comment: (NOTE) The Xpert Xpress SARS-CoV-2/FLU/RSV plus assay is intended as an aid in the diagnosis of influenza from Nasopharyngeal swab specimens and should not be used as a sole basis for treatment. Nasal washings and aspirates are unacceptable for Xpert Xpress SARS-CoV-2/FLU/RSV testing.  Fact Sheet for Patients: EntrepreneurPulse.com.au  Fact Sheet for Healthcare Providers: IncredibleEmployment.be  This test is not yet approved or cleared by the [place] FDA and has been authorized for detection and/or diagnosis of SARS-CoV-2 by FDA under an Emergency Use Authorization (EUA). This EUA will remain in effect (meaning this test can be used) for the duration of the COVID-19 declaration under Section 564(b)(1) of the Act, 21 U.S.C. section 360bbb-3(b)(1), unless the authorization is terminated or revoked.  Performed at [facility], [place] [address] [place], [place] [address]   Expectorated Sputum Assessment w Gram Stain, Rflx to Resp Cult     Status: None   Collection Time: 11/12/20  9:49 AM   Specimen: Expectorated Sputum  Result Value Ref Range Status   Specimen Description EXPECTORATED SPUTUM  Final   Special Requests NONE  Final   Sputum evaluation   Final    THIS SPECIMEN IS ACCEPTABLE FOR SPUTUM CULTURE Performed at [facility], [place] [address], [place], [place] [address]    Report Status 11/13/2020 FINAL  Final  Culture, Respiratory w Gram Stain     Status: None   Collection Time: 11/13/20  9:49 AM  Result Value Ref Range Status   Specimen Description   Final    EXPECTORATED SPUTUM Performed at [facility], [place]  [address] [place], [place] [address]    Special Requests   Final    NONE Reflexed from [id number] Performed at [facility], [place] [address] [place],  [place] [address]    Gram Stain   Final    FEW WBC PRESENT,BOTH PMN AND MONONUCLEAR ABUNDANT GRAM POSITIVE COCCI IN CHAINS IN CLUSTERS FEW GRAM NEGATIVE RODS    Culture   Final    Normal respiratory flora-no Staph aureus or Pseudomonas seen Performed at [facility], [address] [place], [place] [address]    Report Status 11/15/2020 FINAL  Final  Respiratory (~20 pathogens) panel by PCR     Status: Abnormal   Collection Time: 11/13/20  9:52 AM  Result Value Ref Range Status   Adenovirus NOT DETECTED NOT DETECTED Final   Coronavirus 229E NOT DETECTED NOT DETECTED Final    Comment: (NOTE) The Coronavirus on the Respiratory Panel, DOES NOT test for the novel  Coronavirus (2019 nCoV)    Coronavirus HKU1 NOT DETECTED NOT DETECTED Final   Coronavirus NL63 NOT DETECTED NOT DETECTED Final   Coronavirus OC43 NOT DETECTED NOT DETECTED Final   Metapneumovirus DETECTED (A) NOT DETECTED Final   Rhinovirus / Enterovirus NOT DETECTED NOT DETECTED Final   Influenza A NOT DETECTED NOT DETECTED Final   Influenza B NOT DETECTED NOT DETECTED Final   Parainfluenza Virus 1 NOT DETECTED NOT DETECTED Final   Parainfluenza Virus 2 NOT DETECTED NOT DETECTED Final   Parainfluenza Virus 3 NOT DETECTED NOT DETECTED Final   Parainfluenza Virus 4 NOT DETECTED NOT DETECTED Final   Respiratory Syncytial Virus NOT DETECTED NOT DETECTED Final   Bordetella pertussis NOT DETECTED NOT DETECTED Final   Bordetella Parapertussis NOT DETECTED NOT DETECTED Final   Chlamydophila pneumoniae NOT DETECTED NOT DETECTED Final   Mycoplasma pneumoniae NOT DETECTED NOT DETECTED Final    Comment: Performed at [facility], [place] [address], [place], [place] [address]     Time coordinating discharge: 35 minutes  SIGNED:   Aline August,  MD  [facility] 11/18/2020, 10:20 AM

## 2020-11-18 NOTE — Plan of Care (Signed)
  Problem: Education: Goal: Knowledge of General Education information will improve Description: Including pain rating scale, medication(s)/side effects and non-pharmacologic comfort measures Outcome: Progressing   Problem: Health Behavior/Discharge Planning: Goal: Ability to manage health-related needs will improve Outcome: Progressing   Problem: Clinical Measurements: Goal: Ability to maintain clinical measurements within normal limits will improve Outcome: Progressing Goal: Respiratory complications will improve Outcome: Progressing   Problem: Activity: Goal: Risk for activity intolerance will decrease Outcome: Progressing   Problem: Nutrition: Goal: Adequate nutrition will be maintained Outcome: Progressing   Problem: Coping: Goal: Level of anxiety will decrease Outcome: Progressing

## 2020-11-20 ENCOUNTER — Other Ambulatory Visit: Payer: Self-pay | Admitting: Pulmonary Disease

## 2020-11-21 ENCOUNTER — Telehealth: Payer: Self-pay | Admitting: Pulmonary Disease

## 2020-11-21 DIAGNOSIS — D649 Anemia, unspecified: Secondary | ICD-10-CM | POA: Insufficient documentation

## 2020-11-21 NOTE — Telephone Encounter (Signed)
[name] x1, left detailed message per DPR that the Hyzaar was not prescribed by our physicians, it was prescribed by Dr. Jeneen Rinks Hochrein's office, her heart doctor and she will need to contact them directly.  Advised to call with any further questions.

## 2020-11-22 ENCOUNTER — Ambulatory Visit (INDEPENDENT_AMBULATORY_CARE_PROVIDER_SITE_OTHER): Payer: Medicare Other | Admitting: Primary Care

## 2020-11-22 ENCOUNTER — Other Ambulatory Visit: Payer: Self-pay

## 2020-11-22 ENCOUNTER — Encounter: Payer: Self-pay | Admitting: Primary Care

## 2020-11-22 VITALS — BP 122/84 | HR 106 | Temp 97.1°F | Ht 70.0 in | Wt 285.2 lb

## 2020-11-22 DIAGNOSIS — I5189 Other ill-defined heart diseases: Secondary | ICD-10-CM

## 2020-11-22 DIAGNOSIS — J398 Other specified diseases of upper respiratory tract: Secondary | ICD-10-CM

## 2020-11-22 DIAGNOSIS — G4733 Obstructive sleep apnea (adult) (pediatric): Secondary | ICD-10-CM

## 2020-11-22 DIAGNOSIS — J4551 Severe persistent asthma with (acute) exacerbation: Secondary | ICD-10-CM | POA: Diagnosis not present

## 2020-11-22 LAB — CBC WITH DIFFERENTIAL/PLATELET
Basophils Absolute: 0 10*3/uL (ref 0.0–0.1)
Basophils Relative: 0.2 % (ref 0.0–3.0)
Eosinophils Absolute: 0 10*3/uL (ref 0.0–0.7)
Eosinophils Relative: 0 % (ref 0.0–5.0)
HCT: 35.7 % — ABNORMAL LOW (ref 36.0–46.0)
Hemoglobin: 11.3 g/dL — ABNORMAL LOW (ref 12.0–15.0)
Lymphocytes Relative: 25.4 % (ref 12.0–46.0)
Lymphs Abs: 3.8 10*3/uL (ref 0.7–4.0)
MCHC: 31.8 g/dL (ref 30.0–36.0)
MCV: 82.6 fl (ref 78.0–100.0)
Monocytes Absolute: 0.7 10*3/uL (ref 0.1–1.0)
Monocytes Relative: 4.4 % (ref 3.0–12.0)
Neutro Abs: 10.4 10*3/uL — ABNORMAL HIGH (ref 1.4–7.7)
Neutrophils Relative %: 70 % (ref 43.0–77.0)
Platelets: 299 10*3/uL (ref 150.0–400.0)
RBC: 4.32 Mil/uL (ref 3.87–5.11)
RDW: 17.4 % — ABNORMAL HIGH (ref 11.5–15.5)
WBC: 14.9 10*3/uL — ABNORMAL HIGH (ref 4.0–10.5)

## 2020-11-22 LAB — BASIC METABOLIC PANEL
BUN: 23 mg/dL (ref 6–23)
CO2: 28 mEq/L (ref 19–32)
Calcium: 9 mg/dL (ref 8.4–10.5)
Chloride: 107 mEq/L (ref 96–112)
Creatinine, Ser: 0.88 mg/dL (ref 0.40–1.20)
GFR: 68.3 mL/min (ref >60.00)
Glucose, Bld: 105 mg/dL — ABNORMAL HIGH (ref 70–99)
Potassium: 4 mEq/L (ref 3.5–5.1)
Sodium: 144 mEq/L (ref 135–145)

## 2020-11-22 MED ORDER — SODIUM CHLORIDE 3 % IN NEBU: INHALATION_SOLUTION | RESPIRATORY_TRACT | 5 refills | Status: DC | PRN

## 2020-11-22 NOTE — Patient Instructions (Addendum)
   Asthma: - Continue Trelegy 200 one puff daily in am - Continue Singulair 23m daily at bedtime - Continue mucinex 12073mtwice a day  - Continue Fasenra injections per allergy  - Use hypertonic saline nebulizer twice a day for chest congestion (this will make you cough some) - Continue prednisone until complete - Continue working on weight loss  - Call if cough become more purulent   OSA: - Continue CPAP every night, aim to wear 4-6 hours or longer   GERD: - Continue GERD medication  HF: - Continue Bumex 30m41maily  - Continue Lasix 45m54mily as needed for fluid or edema   RX: - Hypertonic saline twice a day as needed   Referral: [facility] for re: tracheobronomalacia   [facility]: [facility] Ol[facility]Ander Slade

## 2020-11-22 NOTE — Progress Notes (Signed)
Please let patient know lab results are available, her PCP can see them in mychart. Hgb has improved. Kidney function and potassium are normal. WBC was elevated which is likely from being on oral steroids. Also the front made her a follow-up with me on 12/20/20 but she already has an apt with Dr. Ander Slade on 12/13/20. We can cancel my apt.

## 2020-11-22 NOTE — Progress Notes (Signed)
@Patient  ID: Patty Howard, female    DOB: 01-18-1956, 65 y.o.   MRN: [id number]  Chief Complaint  Patient presents with   Follow-up    Shortness of breath and wheezing    Referring provider: Buzzy Han*  HPI:  65 year old female, former smoker quit in 2000 and (5-pack-year history).  History significant for OSA, asthma with COPD, hypertension, aortic arthrosclerosis, GERD, obesity.  Patient of Dr. Ander Slade, last seen by pulmonary NP 10/24/20. Spirometry in February 2022 showed moderate restriction. Maintained on Trelegey and CPAP. Ordered for CT chest in 6 months to monitor lung nodular opacities.    Previous [facility] encounter:  10/10/2020 Patient presents today for acute visit with complaint of voice hoarseness and shortness of breath. Dx with influenzxa last week, treated with tamiflu and prednison. She still has congested cough and wheezing. Associated vocie hoarsenss. Cough is productive with yellow-green mucus. She is compliant with Trelegy inhaler and has been using albuterol nebulizer 4-5 times a day.   10/24/2020 Patient presents today for 2 week follow-up asthma exacerbation secondary to influenza. During our visit on 10/10/20 she received Depo-medrol shot and was given prednisone taper. CXR had showed mild bronchitis, no consolidation. She then went to an urgent care about a week after this and was placed on an antibiotic for 3 days (she is unsure of the name) . She was told to stop Trelegy and start budesonide nebulizer until better. She reports that her wheezing has calmed down. She is using flutter valve several times a day. She is taking mucinex twice a day.   11/08/2020 Patient presents today for 2 week follow-up. She resumed Trelegy 200 at last visit and stopped budesonide nebulizer. Breathing is "a little better than it was". She reports being able to walk further distances without becoming winded. Wheezing has also improved a great deal. She still has  congested cough with thick mucus, occasionally purulent. She has been using duoneb 4 times a day scheduled. She is taking mucinex and has been using flutter valve. She was vaccinated for influenza and has had 3 covid vaccines. No f/c/s, nasal congestion, chest tightness. She has upcoming follow CT chest on 11/15/20.  11/22/2020 Patient presents today for hospital follow-up. She was admitted from 11/11/20-11/18/20 for asthma exacerbation. Respiratory panel PCR was positive for metpneumovirus. Respiratory sputum culture was negative on 11/13/20. HRCT showed no acute findings but there was a concern for tracheobronchomalacia. May need referral to [facility] or [facility] for evaluation. Discharged home on Augmentin x 7 days and prednisone taper (42m x 3 days; 4103mx 3 days; 3033m 3 days; 48m23m3 days; 10mg25m days).   She still gets out of breath walking. She is tired. She has a very congested cough with clear - occasionally thick green mucus. She is currently on 30mg 67mrednisone taper. She has been using flutter valve and Incentive spirometer (averaging 1200ml).24m is on CPAP at night and reports compliance with this. She is taking bumex as prescribed. No LE edema. She is working on weight loss, she has lost close to 17 lbs.    Allergies  Allergen Reactions   Banana Hives and Itching    Immunization History  Administered Date(s) Administered   Influenza, High Dose Seasonal PF 03/06/2019, 03/13/2019   Influenza,inj,Quad PF,6+ Mos 04/07/2018   Influenza-Unspecified 03/29/2017, 03/05/2020   Moderna SARS-COV2 Booster Vaccination 05/03/2020   Moderna Sars-Covid-2 Vaccination 08/19/2019, 09/14/2019   Pneumococcal Polysaccharide-23 03/29/2017   Tdap 10/30/2017   Zoster Recombinat (Shingrix)  03/30/2019    Past Medical History:  Diagnosis Date   Anxiety    Arthritis    Asthma    COPD (chronic obstructive pulmonary disease) ([place])    Fracture    closed displaced left radial head   Hypertension    Sleep apnea     does not wear CPAP    Tobacco History: Social History   Tobacco Use  Smoking Status Former   Packs/day: 0.50   Years: 10.00   Pack years: 5.00   Types: Cigarettes   Quit date: 06/15/1998   Years since quitting: 22.4  Smokeless Tobacco Never   Counseling given: Not Answered   Outpatient Medications Prior to Visit  Medication Sig Dispense Refill   albuterol (VENTOLIN HFA) 108 (90 Base) MCG/ACT inhaler Inhale 1-2 puffs into the lungs every 6 (six) hours as needed for wheezing or shortness of breath. 8 g 0   amLODipine (NORVASC) 5 MG tablet TAKE 1 TABLET (5 MG TOTAL) BY MOUTH DAILY. 90 tablet 3   ascorbic acid (VITAMIN C) 500 MG tablet Take 500 mg by mouth daily.     aspirin EC 81 MG tablet Take 81 mg by mouth daily. Swallow whole.     atorvastatin (LIPITOR) 10 MG tablet Take 10 mg by mouth daily.     bumetanide (BUMEX) 2 MG tablet Take 2 mg by mouth daily.     cyclobenzaprine (FLEXERIL) 10 MG tablet Take 10 mg by mouth daily.     Diethylpropion HCl CR 75 MG TB24 Take 1 tablet by mouth every morning.     Fluticasone-Umeclidin-Vilant (TRELEGY ELLIPTA) 200-62.5-25 MCG/INH AEPB Inhale 1 puff into the lungs daily. 30 each 5   furosemide (LASIX) 40 MG tablet Take 1 tablet (40 mg total) by mouth daily as needed for fluid or edema (WEIGHT GAIN OF 2LBS, SHORTNESS OF BREATH). 30 tablet 3   guaiFENesin (MUCINEX) 600 MG 12 hr tablet Take 2 tablets (1,200 mg total) by mouth 2 (two) times daily for 7 days. 28 tablet 0   ipratropium-albuterol (DUONEB) 0.5-2.5 (3) MG/3ML SOLN Take 3 mLs by nebulization every 6 (six) hours as needed. (Patient taking differently: Take 3 mLs by nebulization every 6 (six) hours as needed (sob/wheezing).) 360 mL 3   levocetirizine (XYZAL) 5 MG tablet Take 1 tablet (5 mg total) by mouth every evening. 90 tablet 1   losartan-hydrochlorothiazide (HYZAAR) 100-25 MG tablet Take 1 tablet by mouth daily. 90 tablet 3   montelukast (SINGULAIR) 10 MG tablet Take 1 tablet (10 mg  total) by mouth at bedtime. 30 tablet 2   Multiple Vitamin (MULTIVITAMIN WITH MINERALS) TABS tablet Take 1 tablet by mouth daily. Centrum Multivitamin     pantoprazole (PROTONIX) 40 MG tablet TAKE 1 TABLET BY MOUTH EVERY DAY 90 tablet 1   predniSONE (DELTASONE) 10 MG tablet Prednisone 60 mg daily for 3 days, then 40 mg daily for 3 days, then 30 mg daily for 3 days, then 20 mg daily for 3 days then 10 mg daily for 3 days then stop and follow-up with your lung specialist 45 tablet 0   Vitamin D, Ergocalciferol, (DRISDOL) 1.25 MG (50000 UNIT) CAPS capsule Take 50,000 Units by mouth every Monday.      zolpidem (AMBIEN) 10 MG tablet Take 10 mg by mouth at bedtime.     benzonatate (TESSALON) 200 MG capsule Take 1 capsule (200 mg total) by mouth 3 (three) times daily. (Patient not taking: Reported on 11/22/2020) 20 capsule 0   guaiFENesin-codeine 100-10 MG/5ML  syrup Take 5 mLs by mouth every 4 (four) hours as needed for cough. (Patient not taking: Reported on 11/22/2020) 118 mL 0   Facility-Administered Medications Prior to Visit  Medication Dose Route Frequency Provider Last Rate Last Admin   Benralizumab SOSY 30 mg  30 mg Subcutaneous Q8 Toney Reil, MD   30 mg at 11/07/20 1520   Review of Systems  Review of Systems  Constitutional: Negative.   Respiratory:  Positive for cough and shortness of breath.     Physical Exam  BP 122/84 (BP Location: Left Arm, Cuff Size: Normal)   Pulse (!) 106   Temp (!) 97.1 F (36.2 C) (Temporal)   Ht 5' 10"  (1.778 m)   Wt 285 lb 3.2 oz (129.4 kg)   SpO2 98%   BMI 40.92 kg/m  Physical Exam Constitutional:      Appearance: Normal appearance.  HENT:     Head: Normocephalic and atraumatic.     Mouth/Throat:     Mouth: Mucous membranes are moist.     Pharynx: Oropharynx is clear.  Cardiovascular:     Rate and Rhythm: Normal rate and regular rhythm.  Pulmonary:     Effort: Pulmonary effort is normal.     Breath sounds: Rhonchi present. No  wheezing or rales.     Comments: Coarse rhonchi t/o lungs, congested productive cough - CPT done with improvement Musculoskeletal:        General: Normal range of motion.  Skin:    General: Skin is warm and dry.  Neurological:     General: No focal deficit present.     Mental Status: She is alert and oriented to person, place, and time. Mental status is at baseline.  Psychiatric:        Mood and Affect: Mood normal.        Behavior: Behavior normal.        Thought Content: Thought content normal.        Judgment: Judgment normal.     Lab Results:  CBC    Component Value Date/Time   WBC 14.9 (H) 11/22/2020 1515   RBC 4.32 11/22/2020 1515   HGB 11.3 (L) 11/22/2020 1515   HGB 10.4 (L) 02/13/2020 1554   HCT 35.7 (L) 11/22/2020 1515   HCT 34.0 02/13/2020 1554   PLT 299.0 11/22/2020 1515   PLT 316 02/13/2020 1554   MCV 82.6 11/22/2020 1515   MCV 83 02/13/2020 1554   MCH 26.0 11/12/2020 0331   MCHC 31.8 11/22/2020 1515   RDW 17.4 (H) 11/22/2020 1515   RDW 14.0 02/13/2020 1554   LYMPHSABS 3.8 11/22/2020 1515   LYMPHSABS 2.3 02/13/2020 1554   MONOABS 0.7 11/22/2020 1515   EOSABS 0.0 11/22/2020 1515   EOSABS 0.0 02/13/2020 1554   BASOSABS 0.0 11/22/2020 1515   BASOSABS 0.0 02/13/2020 1554    BMET    Component Value Date/Time   NA 144 11/22/2020 1515   NA 142 06/26/2020 1306   K 4.0 11/22/2020 1515   CL 107 11/22/2020 1515   CO2 28 11/22/2020 1515   GLUCOSE 105 (H) 11/22/2020 1515   BUN 23 11/22/2020 1515   BUN 16 06/26/2020 1306   CREATININE 0.88 11/22/2020 1515   CALCIUM 9.0 11/22/2020 1515   GFRNONAA >60 11/18/2020 0311   GFRAA 90 06/26/2020 1306    BNP    Component Value Date/Time   BNP 51.6 11/27/2019 0413    ProBNP No results found for: PROBNP  Imaging: CT Chest High  Resolution  Result Date: 11/11/2020 CLINICAL DATA:  65 year old female with history of severe asthma. Shortness of breath for the past 6 weeks. EXAM: CT CHEST WITHOUT CONTRAST  TECHNIQUE: Multidetector CT imaging of the chest was performed following the standard protocol without intravenous contrast. High resolution imaging of the lungs, as well as inspiratory and expiratory imaging, was performed. COMPARISON:  Chest CT 11/26/2019. FINDINGS: Cardiovascular: Heart size is mildly enlarged. There is no significant pericardial fluid, thickening or pericardial calcification. Aortic atherosclerosis. No definite coronary artery calcifications. Mediastinum/Nodes: No pathologically enlarged mediastinal or hilar lymph nodes. Please note that accurate exclusion of hilar adenopathy is limited on noncontrast CT scans. Esophagus is unremarkable in appearance. No axillary lymphadenopathy. Lungs/Pleura: There are a few scattered areas of mild architectural distortion, most compatible with areas of chronic post infectious or inflammatory scarring, most evident in the apex of the left upper lobe and in the right middle lobe. High-resolution images otherwise demonstrate no generalized regions of ground-glass attenuation, septal thickening, subpleural reticulation, traction bronchiectasis or honeycombing to indicate interstitial lung disease. Inspiratory and expiratory imaging is unremarkable. No acute consolidative airspace disease. No pleural effusions. A few scattered tiny pulmonary nodules are noted measuring 2-4 mm in size stable in size and number compared to the prior examination from 11/26/2019, considered definitively benign. No larger more suspicious appearing pulmonary nodules or masses are noted. Upper Abdomen: LapBand in position. Musculoskeletal: There are no aggressive appearing lytic or blastic lesions noted in the visualized portions of the skeleton. IMPRESSION: 1. No findings to suggest interstitial lung disease. No acute findings in the thorax. Scattered areas of mild post infectious or inflammatory scarring in the lungs, as above. 2. Small pulmonary nodules measuring 2-4 mm in size, stable  compared to the prior study, considered definitively benign. 3. Aortic atherosclerosis. 4. Mild cardiomegaly. Aortic Atherosclerosis (ICD10-I70.0). Electronically Signed   By: Vinnie Langton M.D.   On: 11/11/2020 09:57   DG Chest Port 1 View  Result Date: 11/11/2020 CLINICAL DATA:  Cough and shortness of breath EXAM: PORTABLE CHEST 1 VIEW COMPARISON:  10/10/2020 FINDINGS: Borderline heart size accentuated by technique. Stable mediastinal contours. There is no edema, consolidation, effusion, or pneumothorax. Artifact from EKG leads. IMPRESSION: No acute or interval finding. Electronically Signed   By: Monte Fantasia M.D.   On: 11/11/2020 04:50     Assessment & Plan:   Acute asthma exacerbation - Hospitalized from 5/30-11/18/20 for acute asthma exacerbation. She had influenza in April 2022 which took several weeks to recover from. Respiratory panel PCR positive for metpneumovirus. HRCT showed no acute findings but there was concern for tracheobronchomalacia. Discharged on Augmentin and prednisone taper. She is doing about the same, still gets our of breath walking. She has a congested cough. Currently on 17m of prednisone taper. She is complaint with mucinex, flutter valve and IS. She has persistent rhonchi t/o lung field. Respiratory sputum culture was negative on 11/13/20. Adding hypertonic saline nebulizer to help to assist with loosening up phlegm. She is on FFin[place]injections per Allergy.   Recommendations: - Continue Trelegy 200 one puff daily in am - Continue Singulair 181mdaily at bedtime - Continue mucinex 120051mwice a day  - Continue Fasenra injections per allergy  - Continue prednisone taper until complete   OSA (obstructive sleep apnea) - Break in CPAP compliance due to recent hospitalization in May. 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  She has had  persistent dyspnea symptoms and upper airway wheezing. She is compliant for the most part with CPAP therapy at night. Referring to [facility]   Diastolic dysfunction - Continue Bumex 61m daily and Lasix 433mdaily as needed for fluid or edema      ElMartyn EhrichNP 11/25/2020

## 2020-11-25 ENCOUNTER — Ambulatory Visit: Payer: Medicare Other | Attending: Family Medicine | Admitting: Physical Therapy

## 2020-11-25 DIAGNOSIS — I5189 Other ill-defined heart diseases: Secondary | ICD-10-CM | POA: Insufficient documentation

## 2020-11-25 DIAGNOSIS — J398 Other specified diseases of upper respiratory tract: Secondary | ICD-10-CM

## 2020-11-25 HISTORY — DX: Other specified diseases of upper respiratory tract: J39.8

## 2020-11-25 HISTORY — DX: Other ill-defined heart diseases: I51.89

## 2020-11-25 NOTE — Assessment & Plan Note (Signed)
-   Concern for tracheobronchomalacia on high resolution CT imaging. She has had persistent dyspnea symptoms and upper airway wheezing. She is compliant for the most part with CPAP therapy at night. Referring to [facility]

## 2020-11-25 NOTE — Assessment & Plan Note (Addendum)
-   Continue Bumex 70m daily and Lasix 459mdaily as needed for fluid or edema

## 2020-11-25 NOTE — Assessment & Plan Note (Signed)
-   Break in CPAP compliance due to recent hospitalization in May. Continue to encourage patient wear CPAP every night for min 4-6 hours or longer

## 2020-11-25 NOTE — Assessment & Plan Note (Addendum)
-   Hospitalized from 5/30-11/18/20 for acute asthma exacerbation. She had influenza in April 2022 which took several weeks to recover from. Respiratory panel PCR positive for metpneumovirus. HRCT showed no acute findings but there was concern for tracheobronchomalacia. Discharged on Augmentin and prednisone taper. She is doing about the same, still gets our of breath walking. She has a congested cough. Currently on 69m of prednisone taper. She is complaint with mucinex, flutter valve and IS. She has persistent rhonchi t/o lung field. Respiratory sputum culture was negative on 11/13/20. Adding hypertonic saline nebulizer to help to assist with loosening up phlegm. She is on F[place]injections per Allergy.   Recommendations: - Continue Trelegy 200 one puff daily in am - Continue Singulair 137mdaily at bedtime - Continue mucinex 120071mwice a day  - Continue Fasenra injections per allergy  - Continue prednisone taper until complete

## 2020-11-29 ENCOUNTER — Telehealth: Payer: Self-pay | Admitting: Pulmonary Disease

## 2020-11-29 NOTE — Telephone Encounter (Signed)
Pt is returning call from nurse from being disconnected earlier today . Pt said someone can call her back Monday .

## 2020-12-02 NOTE — Telephone Encounter (Signed)
Patient is aware of results and voiced her understanding.  Nothing further needed.   

## 2020-12-02 NOTE — Telephone Encounter (Signed)
Martyn Ehrich, NP  11/22/2020  5:15 PM EDT      Please let patient know lab results are available, her PCP can see them in mychart. Hgb has improved. Kidney function and potassium are normal. WBC was elevated which is likely from being on oral steroids. Also the front made her a follow-up with me on 12/20/20 but she already has an apt with Dr. Ander Slade on7/1/22. We can cancel my apt.     Attempted to call pt but unable to reach. Left message for her to return call.

## 2020-12-09 ENCOUNTER — Encounter (HOSPITAL_BASED_OUTPATIENT_CLINIC_OR_DEPARTMENT_OTHER): Payer: Self-pay

## 2020-12-09 ENCOUNTER — Emergency Department (HOSPITAL_BASED_OUTPATIENT_CLINIC_OR_DEPARTMENT_OTHER)
Admission: EM | Admit: 2020-12-09 | Discharge: 2020-12-09 | Disposition: A | Discharge status: Home / Self Care | Payer: Medicare Other | Attending: Emergency Medicine | Admitting: Emergency Medicine

## 2020-12-09 ENCOUNTER — Other Ambulatory Visit: Payer: Self-pay

## 2020-12-09 ENCOUNTER — Emergency Department (HOSPITAL_BASED_OUTPATIENT_CLINIC_OR_DEPARTMENT_OTHER): Payer: Medicare Other

## 2020-12-09 DIAGNOSIS — Z7982 Long term (current) use of aspirin: Secondary | ICD-10-CM | POA: Insufficient documentation

## 2020-12-09 DIAGNOSIS — Z87891 Personal history of nicotine dependence: Secondary | ICD-10-CM | POA: Insufficient documentation

## 2020-12-09 DIAGNOSIS — M25552 Pain in left hip: Secondary | ICD-10-CM | POA: Insufficient documentation

## 2020-12-09 DIAGNOSIS — Z79899 Other long term (current) drug therapy: Secondary | ICD-10-CM | POA: Insufficient documentation

## 2020-12-09 DIAGNOSIS — J45901 Unspecified asthma with (acute) exacerbation: Secondary | ICD-10-CM | POA: Diagnosis not present

## 2020-12-09 DIAGNOSIS — I10 Essential (primary) hypertension: Secondary | ICD-10-CM | POA: Insufficient documentation

## 2020-12-09 DIAGNOSIS — M25559 Pain in unspecified hip: Secondary | ICD-10-CM

## 2020-12-09 DIAGNOSIS — J449 Chronic obstructive pulmonary disease, unspecified: Secondary | ICD-10-CM | POA: Diagnosis not present

## 2020-12-09 DIAGNOSIS — Z7951 Long term (current) use of inhaled steroids: Secondary | ICD-10-CM | POA: Insufficient documentation

## 2020-12-09 DIAGNOSIS — Z8616 Personal history of COVID-19: Secondary | ICD-10-CM | POA: Diagnosis not present

## 2020-12-09 MED ORDER — NAPROXEN 500 MG PO TABS: 500.0000 mg | ORAL_TABLET | Freq: Two times a day (BID) | ORAL | 0 refills | Status: AC

## 2020-12-09 MED ORDER — OXYCODONE-ACETAMINOPHEN 5-325 MG PO TABS
1.0000 | ORAL_TABLET | Freq: Once | ORAL | Status: AC
  Administered 2020-12-09: 1 via ORAL
  Filled 2020-12-09: qty 1

## 2020-12-09 NOTE — Discharge Instructions (Addendum)
Your x-rays did not show any signs of a fracture.  Follow-up with your doctor within 2 to 3 days.  Return if you have fevers worsening pain or any additional concerns.

## 2020-12-09 NOTE — ED Triage Notes (Signed)
Pt states she has had hip pain for a week that radiates to her leg   Denies fall / injury

## 2020-12-09 NOTE — ED Provider Notes (Signed)
[facility] DEPT Provider Note   CSN: [id number] Arrival date & time: 12/09/20  1848     History Chief Complaint  Patient presents with   Hip Pain    Patty Howard is a 65 y.o. female.  Patient presents ER chief complaint of left-sided hip pain.  Describes a sharp and aching in the left mid to lateral aspect of the hip.  She is been having this pain for about a week and has been worsening over the course of the week.  Worse when she moves her leg a certain way improved when she lies still.  Denies fevers or cough or vomiting or diarrhea.  Denies any fall or trauma that she can recall.  She states that when she moves her legs she has a sharp pain in the left lateral hip but that the pain radiates down her leg.      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   Diastolic dysfunction 50/38/8828   Asthma exacerbation 11/13/2020   Menopause 11/08/2020   Influenza 10/10/2020   Medication management 04/30/2020   Gastroesophageal reflux disease 03/14/2020   OSA (obstructive sleep apnea) 02/15/2020   Abnormal findings on diagnostic imaging of lung 01/08/2020   Educated about COVID-19 virus infection 11/30/2019   Aortic atherosclerosis ([place]) 11/30/2019   Chest pain 11/26/2019   Acute asthma exacerbation 05/22/2019   Left radial head fracture 02/03/2019   Closed fracture of radial head 01/29/2019   Prediabetes 10/19/2018   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) ([place]) 10/19/2018   Insomnia 10/19/2018   Elbow pain, chronic, left 10/19/2018   Dyspnea 10/12/2017   Pancolitis ([place]) 05/20/2017   H/O Spinal surgery 01/18/2015   Essential hypertension 01/18/2015    Obesity 01/18/2015    Past Surgical History:  Procedure Laterality Date   BACK SURGERY     spinal stimulator   BREAST BIOPSY Right 2017   benign   COLONOSCOPY W/ BIOPSIES AND POLYPECTOMY     DILATION AND CURETTAGE OF UTERUS     LAPAROSCOPIC ROUX-EN-Y GASTRIC BYPASS WITH UPPER ENDOSCOPY AND REMOVAL OF LAP BAND     RADIAL HEAD ARTHROPLASTY Left 02/03/2019   Procedure: LEFT RADIAL HEAD ARTHROPLASTY;  Surgeon: [name], MD;  Location: [facility];  Service: Orthopedics;  Laterality: Left;  AXILLARY BLOCK VS BIER BLOCK   TUBAL LIGATION       OB History   No obstetric history on file.     Family History  Problem Relation Age of Onset   Hypertension Mother    Stroke Mother    Cancer Father     Social History   Tobacco Use   Smoking status: Former    Packs/day: 0.50    Years: 10.00    Pack years: 5.00    Types: Cigarettes    Quit date: 06/15/1998    Years since quitting: 22.5   Smokeless tobacco: Never  Vaping Use   Vaping Use: Never used  Substance Use Topics   Alcohol use: Never   Drug use: Never    Home Medications Prior to Admission medications   Medication Sig Start Date End Date Taking?  Authorizing Provider  naproxen (NAPROSYN) 500 MG tablet Take 1 tablet (500 mg total) by mouth 2 (two) times daily with a meal for 20 doses. 12/09/20 12/19/20 Yes Luna Fuse, MD  albuterol (VENTOLIN HFA) 108 (90 Base) MCG/ACT inhaler Inhale 1-2 puffs into the lungs every 6 (six) hours as needed for wheezing or shortness of breath. 10/24/20   Martyn Ehrich, NP  amLODipine (NORVASC) 5 MG tablet TAKE 1 TABLET (5 MG TOTAL) BY MOUTH DAILY. 09/24/20 12/23/20  Minus Breeding, MD  ascorbic acid (VITAMIN C) 500 MG tablet Take 500 mg by mouth daily.    [provider]  aspirin EC 81 MG tablet Take 81 mg by mouth daily. Swallow whole.    [provider]  atorvastatin (LIPITOR) 10 MG tablet Take 10 mg by mouth daily.    [provider]  benzonatate (TESSALON) 200  MG capsule Take 1 capsule (200 mg total) by mouth 3 (three) times daily. Patient not taking: Reported on 11/22/2020 11/18/20   Aline August, MD  bumetanide (BUMEX) 2 MG tablet Take 2 mg by mouth daily. 03/05/20   [provider]  cyclobenzaprine (FLEXERIL) 10 MG tablet Take 10 mg by mouth daily. 08/09/19   [provider]  Diethylpropion HCl CR 75 MG TB24 Take 1 tablet by mouth every morning. 11/02/20   [provider]  Fluticasone-Umeclidin-Vilant (TRELEGY ELLIPTA) 200-62.5-25 MCG/INH AEPB Inhale 1 puff into the lungs daily. 10/24/20   Martyn Ehrich, NP  furosemide (LASIX) 40 MG tablet Take 1 tablet (40 mg total) by mouth daily as needed for fluid or edema (WEIGHT GAIN OF 2LBS, SHORTNESS OF BREATH). 06/10/20 11/22/20  Minus Breeding, MD  guaiFENesin-codeine 100-10 MG/5ML syrup Take 5 mLs by mouth every 4 (four) hours as needed for cough. Patient not taking: Reported on 11/22/2020 11/18/20   Aline August, MD  ipratropium-albuterol (DUONEB) 0.5-2.5 (3) MG/3ML SOLN Take 3 mLs by nebulization every 6 (six) hours as needed. Patient taking differently: Take 3 mLs by nebulization every 6 (six) hours as needed (sob/wheezing). 11/08/20   Martyn Ehrich, NP  levocetirizine (XYZAL) 5 MG tablet Take 1 tablet (5 mg total) by mouth every evening. 07/23/20   Valentina Shaggy, MD  losartan-hydrochlorothiazide (HYZAAR) 100-25 MG tablet Take 1 tablet by mouth daily. 05/24/20   Minus Breeding, MD  montelukast (SINGULAIR) 10 MG tablet Take 1 tablet (10 mg total) by mouth at bedtime. 07/23/20   Valentina Shaggy, MD  Multiple Vitamin (MULTIVITAMIN WITH MINERALS) TABS tablet Take 1 tablet by mouth daily. Centrum Multivitamin    [provider]  pantoprazole (PROTONIX) 40 MG tablet TAKE 1 TABLET BY MOUTH EVERY DAY 06/17/20   Olalere, Adewale A, MD  predniSONE (DELTASONE) 10 MG tablet Prednisone 60 mg daily for 3 days, then 40 mg daily for 3 days, then 30 mg daily for 3 days, then  20 mg daily for 3 days then 10 mg daily for 3 days then stop and follow-up with your lung specialist 11/18/20   Aline August, MD  sodium chloride HYPERTONIC 3 % nebulizer solution Take by nebulization as needed for other or cough. Twice a day as needed for chest congestion 11/22/20   Martyn Ehrich, NP  Vitamin D, Ergocalciferol, (DRISDOL) 1.25 MG (50000 UNIT) CAPS capsule Take 50,000 Units by mouth every Monday.  10/27/19   [provider]  zolpidem (AMBIEN) 10 MG tablet Take 10 mg by mouth at bedtime.    [provider]    Allergies  Banana  Review of Systems   Review of Systems  Constitutional:  Negative for fever.  HENT:  Negative for ear pain.   Eyes:  Negative for pain.  Respiratory:  Negative for cough.   Cardiovascular:  Negative for chest pain.  Gastrointestinal:  Negative for abdominal pain.  Genitourinary:  Negative for flank pain.  Musculoskeletal:  Negative for back pain.  Skin:  Negative for rash.  Neurological:  Negative for headaches.   Physical Exam Updated Vital Signs BP 118/71 (BP Location: Right Arm)   Pulse 96   Temp 98.3 F (36.8 C) (Oral)   Resp 18   Ht 5' 10"  (1.778 m)   Wt 129 kg   SpO2 98%   BMI 40.81 kg/m   Physical Exam Constitutional:      General: She is not in acute distress.    Appearance: Normal appearance.  HENT:     Head: Normocephalic.     Nose: Nose normal.  Eyes:     Extraocular Movements: Extraocular movements intact.  Cardiovascular:     Rate and Rhythm: Normal rate.  Pulmonary:     Effort: Pulmonary effort is normal.  Musculoskeletal:     Cervical back: Normal range of motion.     Comments: Patient points to the left lateral hip pain when she ranges her left hip.  Otherwise neurovascular intact.  No C or T or L-spine tenderness noted.  No weakness noted.  Neurological:     General: No focal deficit present.     Mental Status: She is alert. Mental status is at baseline.     Motor: No weakness.    ED  Results / Procedures / Treatments   Labs (all labs ordered are listed, but only abnormal results are displayed) Labs Reviewed - No data to display  EKG None  Radiology DG Hip Unilat With Pelvis 2-3 Views Left  Result Date: 12/09/2020 CLINICAL DATA:  Left hip pain EXAM: DG HIP (WITH OR WITHOUT PELVIS) 2-3V LEFT COMPARISON:  None. FINDINGS: Mild symmetric degenerative changes in the hips with joint space narrowing and spurring. SI joints symmetric. Degenerative changes in the visualized lower lumbar spine. No acute bony abnormality. Specifically, no fracture, subluxation, or dislocation. IMPRESSION: No acute bony abnormality. Electronically Signed   By: Rolm Baptise M.D.   On: 12/09/2020 20:54    Procedures Procedures   Medications Ordered in ED Medications  oxyCODONE-acetaminophen (PERCOCET/ROXICET) 5-325 MG per tablet 1 tablet (1 tablet Oral Given 12/09/20 2049)    ED Course  I have reviewed the triage vital signs and the nursing notes.  Pertinent labs & imaging results that were available during my care of the patient were reviewed by me and considered in my medical decision making (see chart for details).    MDM Rules/Calculators/A&P                           I doubt hip fracture or pathology given the pain is lateral hip more consistent with possible iliotibial band or bursitis in the left lateral hip region.  X-rays of the hip show no acute pathology.  Patient given Percocet here for pain and advised outpatient follow-up with her doctor within the week.  Advising immediate return for worsening symptoms fevers or any additional concerns.  Final Clinical Impression(s) / ED Diagnoses Final diagnoses:  Hip pain    Rx / DC Orders ED Discharge Orders          Ordered  naproxen (NAPROSYN) 500 MG tablet  2 times daily with meals        12/09/20 2104             Luna Fuse, MD 12/09/20 2104

## 2020-12-13 ENCOUNTER — Ambulatory Visit: Payer: PRIVATE HEALTH INSURANCE | Admitting: Pulmonary Disease

## 2020-12-13 DIAGNOSIS — M25559 Pain in unspecified hip: Secondary | ICD-10-CM | POA: Insufficient documentation

## 2020-12-17 ENCOUNTER — Other Ambulatory Visit: Payer: Self-pay | Admitting: Internal Medicine

## 2020-12-17 ENCOUNTER — Other Ambulatory Visit: Payer: Self-pay

## 2020-12-17 ENCOUNTER — Ambulatory Visit
Admission: RE | Admit: 2020-12-17 | Discharge: 2020-12-17 | Disposition: A | Discharge status: Home / Self Care | Payer: Medicare Other | Source: Ambulatory Visit

## 2020-12-17 DIAGNOSIS — M25552 Pain in left hip: Secondary | ICD-10-CM

## 2020-12-19 ENCOUNTER — Other Ambulatory Visit: Payer: Self-pay | Admitting: Family Medicine

## 2020-12-20 ENCOUNTER — Ambulatory Visit: Payer: Medicare Other | Admitting: Primary Care

## 2020-12-23 ENCOUNTER — Encounter: Payer: Self-pay | Admitting: Orthopaedic Surgery

## 2020-12-23 ENCOUNTER — Other Ambulatory Visit: Payer: Self-pay | Admitting: Internal Medicine

## 2020-12-23 ENCOUNTER — Other Ambulatory Visit: Payer: Self-pay

## 2020-12-23 ENCOUNTER — Ambulatory Visit (INDEPENDENT_AMBULATORY_CARE_PROVIDER_SITE_OTHER): Payer: Medicare Other | Admitting: Orthopaedic Surgery

## 2020-12-23 DIAGNOSIS — M1612 Unilateral primary osteoarthritis, left hip: Secondary | ICD-10-CM | POA: Insufficient documentation

## 2020-12-23 DIAGNOSIS — M7062 Trochanteric bursitis, left hip: Secondary | ICD-10-CM

## 2020-12-23 DIAGNOSIS — M25552 Pain in left hip: Secondary | ICD-10-CM

## 2020-12-23 HISTORY — DX: Trochanteric bursitis, left hip: M70.62

## 2020-12-23 MED ORDER — HYDROCODONE-ACETAMINOPHEN 5-325 MG PO TABS: 1.0000 | ORAL_TABLET | Freq: Four times a day (QID) | ORAL | 0 refills | Status: DC | PRN

## 2020-12-23 NOTE — Progress Notes (Signed)
Office Visit Note   Patient: Patty Howard           Date of Birth: Feb 22, 1956           MRN: [id number] Visit Date: 12/23/2020              Requested by: Buzzy Han, MD [place],  [place] [id number] PCP: Buzzy Han, MD   Assessment & Plan: Visit Diagnoses:  1. Trochanteric bursitis, left hip   2. Unilateral primary osteoarthritis, left hip     Plan: Left trochanteric injection performed with some improvement she still has significant trouble walking pronounced limp.  Prescription given for a walker and she can go to [place] to pick this up.  [place] for pain.  Recheck 2 weeks persistent problems we may need MRI scan of her hip since she may have significant tendinitis or more significant arthritic changes than appreciated on previous CT scan done in the emergency room 2 weeks ago. Follow-Up Instructions: No follow-ups on file.   Orders:  Orders Placed This Encounter  Procedures   Large Joint Inj   No orders of the defined types were placed in this encounter.     Procedures: Large Joint Inj: L greater trochanter on 12/23/2020 3:42 PM Details: 22 G needle, lateral approach Medications: 40 mg methylPREDNISolone acetate 40 MG/ML; 4 mL bupivacaine 0.25 %     Clinical Data: No additional findings.   Subjective: Chief Complaint  Patient presents with   Left Hip - Pain    HPI 65 year old female seen with left hip pain times greater than 2 weeks.  She noted for several months she had great difficulty getting her socks on cannot figure 4 leg has severe pain if she internally and externally rotates her left hip.  No problems with the right hip.  She has had great difficulty walking points laterally over her hip where she has pain also in the groin it radiates down to her knee.  Patient states she cannot walk and needs a walker she rates her pain is severe.  She is taking gabapentin, tramadol and Naprosyn.  She has had several  trips to the ER.  CT scan 12/17/2020 showed mild bilateral hip osteoarthritis without fracture.  Degenerative changes left SI joint noted.  MRI scan done 10/13/2020 showed some canal stenosis at L4-5 and facet arthropathy.  No history of gout she has had sed rate and CRP which were normal in the past.  Past history of radial head comminuted fracture with radial head arthroplasty done on the left by me August 2020 doing well.  Patient has not had uric acid .  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  Review of Systems previous radial head arthroplasty 2020.  History of lumbar spine surgery.  Hip osteoarthritis.  Negative for fever or chills.   Objective: Vital Signs: BP (!) 153/92   Pulse (!) 109   Ht 5' 10"  (1.778 m)   Wt 284 lb (128.8 kg)   BMI 40.75 kg/m   Physical Exam Constitutional:      Appearance: She is well-developed.  HENT:     Head: Normocephalic.     Right Ear: External ear normal.     Left Ear: External ear normal. There is no impacted cerumen.  Eyes:     Pupils: Pupils are equal, round, and reactive to light.  Neck:     Thyroid: No thyromegaly.     Trachea: No tracheal deviation.  Cardiovascular:  Rate and Rhythm: Normal rate.  Pulmonary:     Effort: Pulmonary effort is normal.  Abdominal:     Palpations: Abdomen is soft.  Musculoskeletal:     Cervical back: No rigidity.  Skin:    General: Skin is warm and dry.  Neurological:     Mental Status: She is alert and oriented to person, place, and time.  Psychiatric:        Behavior: Behavior normal.    Ortho Exam patient cries out with internal rotation of her left hip limited only 5 to 10 degrees.  Reproducible with distraction.  External rotation 30 degrees with sharp pain.  Knee flexion extension is nonpainful knee ligamentous exam is normal no knee effusion.  Exquisite tenderness over the left greater trochanter.  Specialty Comments:  No specialty comments  available.  Imaging: CLINICAL DATA:  Left hip pain for 2 weeks   EXAM: CT OF THE LEFT HIP WITHOUT CONTRAST   TECHNIQUE: Multidetector CT imaging of the left hip was performed according to the standard protocol. Multiplanar CT image reconstructions were also generated.   COMPARISON:  X-ray 12/09/2020   FINDINGS: Bones/Joint/Cartilage   No acute fracture. No dislocation. No evidence of femoral head avascular necrosis. Mild osteoarthritis of the left hip as manifested by joint space narrowing and marginal osteophyte formation. Small subchondral cysts within the anterosuperior acetabulum. No significant hip joint effusion is visualized. Mild-moderate arthropathy of the visualized left sacroiliac joint and pubic symphysis. No suspicious lytic or sclerotic bone lesion.   Ligaments   Suboptimally assessed by CT.   Muscles and Tendons   No acute musculotendinous abnormality by CT.   Soft tissues   No soft tissue edema or fluid collection. No left inguinal lymphadenopathy. No acute findings are seen within the visualized pelvis.   IMPRESSION: 1. No acute osseous abnormality of the left hip. 2. Mild osteoarthritis of the left hip. 3. Mild-moderate arthropathy of the visualized left sacroiliac joint and pubic symphysis.     Electronically Signed   By: Davina Poke D.O.   On: 12/18/2020 09:33     PMFS History: Patient Active Problem List   Diagnosis Date Noted   Trochanteric bursitis, left hip 12/23/2020   Unilateral primary osteoarthritis, left hip 12/23/2020   Tracheobronchomalacia 06/23/3233   Diastolic dysfunction 57/32/2025   Asthma exacerbation 11/13/2020   Menopause 11/08/2020   Influenza 10/10/2020   Medication management 04/30/2020   Gastroesophageal reflux disease 03/14/2020   OSA (obstructive sleep apnea) 02/15/2020   Abnormal findings on diagnostic imaging of lung 01/08/2020   Educated about COVID-19 virus infection 11/30/2019   Aortic  atherosclerosis ([place]) 11/30/2019   Chest pain 11/26/2019   Acute asthma exacerbation 05/22/2019   Left radial head fracture 02/03/2019   Closed fracture of radial head 01/29/2019   Prediabetes 10/19/2018   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) ([place]) 10/19/2018   Insomnia 10/19/2018   Elbow pain, chronic, left 10/19/2018   Dyspnea 10/12/2017   Pancolitis ([place]) 05/20/2017   H/O Spinal surgery 01/18/2015   Essential hypertension 01/18/2015   Obesity 01/18/2015   Past Medical History:  Diagnosis Date   Anxiety    Arthritis    Asthma    COPD (chronic obstructive pulmonary disease) ([place])    Fracture    closed displaced left radial head   Hypertension    Sleep apnea    does not wear CPAP    Family History  Problem Relation Age of Onset   Hypertension Mother    Stroke  Mother    Cancer Father     Past Surgical History:  Procedure Laterality Date   BACK SURGERY     spinal stimulator   BREAST BIOPSY Right 2017   benign   COLONOSCOPY W/ BIOPSIES AND POLYPECTOMY     DILATION AND CURETTAGE OF UTERUS     LAPAROSCOPIC ROUX-EN-Y GASTRIC BYPASS WITH UPPER ENDOSCOPY AND REMOVAL OF LAP BAND     RADIAL HEAD ARTHROPLASTY Left 02/03/2019   Procedure: LEFT RADIAL HEAD ARTHROPLASTY;  Surgeon: [name], MD;  Location: [facility];  Service: Orthopedics;  Laterality: Left;  AXILLARY BLOCK VS BIER BLOCK   TUBAL LIGATION     Social History   Occupational History   Not on file  Tobacco Use   Smoking status: Former    Packs/day: 0.50    Years: 10.00    Pack years: 5.00    Types: Cigarettes    Quit date: 06/15/1998    Years since quitting: 22.5   Smokeless tobacco: Never  Vaping Use   Vaping Use: Never used  Substance and Sexual Activity   Alcohol use: Never   Drug use: Never   Sexual activity: Not on file

## 2020-12-24 ENCOUNTER — Telehealth: Payer: Self-pay | Admitting: Orthopaedic Surgery

## 2020-12-24 MED ORDER — BUPIVACAINE HCL 0.25 % IJ SOLN
4.0000 mL | INTRAMUSCULAR | Status: AC | PRN
  Administered 2020-12-23: 4 mL via INTRA_ARTICULAR

## 2020-12-24 MED ORDER — METHYLPREDNISOLONE ACETATE 40 MG/ML IJ SUSP
40.0000 mg | INTRAMUSCULAR | Status: AC | PRN
  Administered 2020-12-23: 40 mg via INTRA_ARTICULAR

## 2020-12-24 NOTE — Telephone Encounter (Signed)
Patient called. She says she went to [place] to pick up walker. They need a new RX with the DX code. Fax number is [phone]

## 2020-12-24 NOTE — Telephone Encounter (Signed)
Patient states that she needs rx that states walker with a seat and brakes. She also wanted to let you know that the shot she was given yesterday has not helped yet, but she is going to try and give it some time.   OK for rx for rollator walker?

## 2020-12-25 NOTE — Telephone Encounter (Signed)
faxed

## 2020-12-27 ENCOUNTER — Telehealth: Payer: Self-pay

## 2020-12-27 DIAGNOSIS — M1612 Unilateral primary osteoarthritis, left hip: Secondary | ICD-10-CM

## 2020-12-27 DIAGNOSIS — M7062 Trochanteric bursitis, left hip: Secondary | ICD-10-CM

## 2020-12-27 NOTE — Addendum Note (Signed)
Addended by: Robyne Peers on: 12/27/2020 01:25 PM   Modules accepted: Orders

## 2020-12-27 NOTE — Telephone Encounter (Signed)
Please advise 

## 2020-12-27 NOTE — Telephone Encounter (Signed)
Order placed in chart. Lvm informing pt

## 2020-12-27 NOTE — Telephone Encounter (Signed)
Patient called stating that she is in severe pain in the left hip and that the injection did not help at all.  Would like to have a referral for an MRI to be done of left hip?  Cb# [phone].  Please advise.  Thank you.

## 2020-12-29 ENCOUNTER — Ambulatory Visit
Admission: RE | Admit: 2020-12-29 | Discharge: 2020-12-29 | Disposition: A | Discharge status: Home / Self Care | Payer: Medicare Other | Source: Ambulatory Visit | Attending: Orthopaedic Surgery | Admitting: Orthopaedic Surgery

## 2020-12-29 DIAGNOSIS — M1612 Unilateral primary osteoarthritis, left hip: Secondary | ICD-10-CM

## 2020-12-29 DIAGNOSIS — M7062 Trochanteric bursitis, left hip: Secondary | ICD-10-CM

## 2021-01-02 ENCOUNTER — Telehealth: Payer: Self-pay

## 2021-01-02 ENCOUNTER — Ambulatory Visit: Payer: Self-pay

## 2021-01-02 NOTE — Telephone Encounter (Signed)
Patient would like a RF on her Hydrocodone.  Pharm: [id number].   Would like OV note faxed to [phone] ([facility] PCP)   CB: [phone]

## 2021-01-02 NOTE — Telephone Encounter (Signed)
Please advise. Recent MRI report put on your desk.

## 2021-01-03 ENCOUNTER — Encounter (HOSPITAL_BASED_OUTPATIENT_CLINIC_OR_DEPARTMENT_OTHER): Payer: Self-pay

## 2021-01-03 ENCOUNTER — Other Ambulatory Visit: Payer: Self-pay | Admitting: Surgery

## 2021-01-03 ENCOUNTER — Other Ambulatory Visit: Payer: Self-pay

## 2021-01-03 ENCOUNTER — Ambulatory Visit (INDEPENDENT_AMBULATORY_CARE_PROVIDER_SITE_OTHER): Payer: Medicare Other

## 2021-01-03 ENCOUNTER — Emergency Department (HOSPITAL_BASED_OUTPATIENT_CLINIC_OR_DEPARTMENT_OTHER)
Admission: EM | Admit: 2021-01-03 | Discharge: 2021-01-03 | Discharge status: Against Medical Advice | Payer: Medicare Other | Attending: Emergency Medicine | Admitting: Emergency Medicine

## 2021-01-03 DIAGNOSIS — Z5321 Procedure and treatment not carried out due to patient leaving prior to being seen by health care provider: Secondary | ICD-10-CM | POA: Insufficient documentation

## 2021-01-03 DIAGNOSIS — M79652 Pain in left thigh: Secondary | ICD-10-CM | POA: Diagnosis present

## 2021-01-03 DIAGNOSIS — Z96642 Presence of left artificial hip joint: Secondary | ICD-10-CM | POA: Insufficient documentation

## 2021-01-03 DIAGNOSIS — J4550 Severe persistent asthma, uncomplicated: Secondary | ICD-10-CM | POA: Diagnosis not present

## 2021-01-03 MED ORDER — HYDROCODONE-ACETAMINOPHEN 5-325 MG PO TABS: 1.0000 | ORAL_TABLET | Freq: Two times a day (BID) | ORAL | 0 refills | Status: DC | PRN

## 2021-01-03 NOTE — ED Triage Notes (Signed)
"  Seen here 2 weeks ago for the same problem. I need a left hip replacement and I tore ligaments in my left upper thigh and it is in pain" per pt

## 2021-01-03 NOTE — Telephone Encounter (Signed)
Called patient. No answer. Left message that it has been sent to her pharmacy.

## 2021-01-03 NOTE — ED Notes (Signed)
Pt requested to speak with someone, upon arrive to desk pt stated her orthopedic doctor sent a prescription for Hydrocodone Acetaminophen, she elected to go pick up pain meds and try them. She is aware to return or follow up if she does not get relief.

## 2021-01-07 ENCOUNTER — Ambulatory Visit (INDEPENDENT_AMBULATORY_CARE_PROVIDER_SITE_OTHER): Payer: Medicare Other | Admitting: Orthopaedic Surgery

## 2021-01-07 ENCOUNTER — Other Ambulatory Visit: Payer: Self-pay

## 2021-01-07 ENCOUNTER — Encounter: Payer: Self-pay | Admitting: Orthopaedic Surgery

## 2021-01-07 VITALS — BP 155/77 | HR 116 | Ht 70.0 in | Wt 280.0 lb

## 2021-01-07 DIAGNOSIS — M1612 Unilateral primary osteoarthritis, left hip: Secondary | ICD-10-CM

## 2021-01-09 NOTE — Progress Notes (Signed)
Office Visit Note   Patient: Patty Howard           Date of Birth: [birth year]           MRN: [id number] Visit Date: 01/07/2021              Requested by: Buzzy Han, MD [place],  [place] [address] PCP: Buzzy Han, MD   Assessment & Plan: Visit Diagnoses:  1. Unilateral primary osteoarthritis, left hip     Plan: We discussed of hip arthroplasty she has bone-on-bone changes torn labrum and gluteus medius tear.  Direct anterior approach discussed.  Questions elicited and answered.  She understands request to proceed.  Risks of surgery discussed.  Plan overnight stay.  Questions elicited and answered and decision for surgery made.  Follow-Up Instructions: No follow-ups on file.   Orders:  No orders of the defined types were placed in this encounter.  No orders of the defined types were placed in this encounter.     Procedures: No procedures performed   Clinical Data: No additional findings.   Subjective: Chief Complaint  Patient presents with   Left Hip - Pain    MRI review    HPI: 65 year old female returns with severe left hip pain.  She is amatory with a walker has been to the emergency room has taken hydrocodone and states she can only walk to the bathroom and back.  She has had rheumatologic labs which were unremarkable.  Previous surgery for radial head arthroplasty on the left.  Patient states "I cannot walk without the walker now and my groin is killing me".  She gets relief with sitting position.  Problems getting her socks on.  She had previous lumbar surgery and has some stenosis at L4-5 by MRI 11/01/2020.  Previous CT scan had showed some hip arthroplasty and new MRI scan shows severe left hip osteoarthritis with bone-on-bone joint space subchondral edema left labrum and anterior portion labrum severely torn and degenerative.  Left gluteus minimus and medius appear torn with severe atrophy of the gluteus minimus  and moderate atrophy of the gluteus medius.  Review of Systems all other systems noncontributory to HPI.   Objective: Vital Signs: BP (!) 155/77   Pulse (!) 116   Ht 5' 10"  (1.778 m)   Wt 280 lb (127 kg)   BMI 40.18 kg/m   Physical Exam Constitutional:      Appearance: She is well-developed.  HENT:     Head: Normocephalic.     Right Ear: External ear normal.     Left Ear: External ear normal. There is no impacted cerumen.  Eyes:     Pupils: Pupils are equal, round, and reactive to light.  Neck:     Thyroid: No thyromegaly.     Trachea: No tracheal deviation.  Cardiovascular:     Rate and Rhythm: Normal rate.  Pulmonary:     Effort: Pulmonary effort is normal.  Abdominal:     Palpations: Abdomen is soft.  Musculoskeletal:     Cervical back: No rigidity.  Skin:    General: Skin is warm and dry.  Neurological:     Mental Status: She is alert and oriented to person, place, and time.  Psychiatric:        Behavior: Behavior normal.    Ortho Exam patient is 10 degrees internal rotation left hip with severe pain.  Weakness with abduction.  Quad strength is good.  Internal and external right hip without pain.  Distal pulses are palpable negative straight leg raising 90 degrees.  Specialty Comments:  No specialty comments available.  Imaging: CLINICAL DATA:  Chronic left hip pain.  No known injury.   EXAM: MR OF THE LEFT HIP WITHOUT CONTRAST   TECHNIQUE: Multiplanar, multisequence MR imaging was performed. No intravenous contrast was administered.   COMPARISON:  Plain films left hip 12/09/2020.   FINDINGS: Bones: There is subchondral edema about the left hip. No fracture, stress change or worrisome lesion is identified. No avascular necrosis of the femoral heads. Minimal subchondral edema is seen about the right hip.   Articular cartilage and labrum   Articular cartilage: Bone-on-bone joint space narrowing is present left.   Labrum: Evaluation is limited but  the left superior and anterior labrum appears severely degenerated.   Joint or bursal effusion   Joint effusion:  Small to moderate left hip effusion.   Bursae: There is small volume of fluid in the trochanteric bursa bilaterally.   Muscles and tendons   Muscles and tendons: The left gluteus medius and minimus appear torn. There is severe left gluteus minimus and moderate to moderately severe left gluteus medius fatty atrophy. Evaluation of the tendons is limited due to the patient's habitus. Dedicated imaging of the right hip is not provided but the right gluteus minimus appears completely torn with associated moderately severe fatty atrophy.   Other findings   Miscellaneous:   None.   IMPRESSION: Severe left hip osteoarthritis with bone-on-bone joint space narrowing and subchondral edema about the joint. The left labrum is not well seen but the superior and anterior labrum appears severely degenerated and torn.   The left gluteus medius and minimus appear completely torn with severe atrophy of the gluteus minimus and moderate to moderately severe fatty atrophy of the gluteus medius.   Dedicated imaging of the right hip is not performed but the right gluteus minimus appears completely torn with associated moderate to moderately severe fatty atrophy.     Electronically Signed   By: Inge Rise M.D.   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   Diastolic dysfunction 34/19/6222   Asthma exacerbation 11/13/2020   Menopause 11/08/2020   Influenza 10/10/2020   Medication management 04/30/2020   Gastroesophageal reflux disease 03/14/2020   OSA (obstructive sleep apnea) 02/15/2020   Abnormal findings on diagnostic imaging of lung 01/08/2020   Educated about COVID-19 virus infection  11/30/2019   Aortic atherosclerosis ([place]) 11/30/2019   Chest pain 11/26/2019   Acute asthma exacerbation 05/22/2019   Left radial head fracture 02/03/2019   Closed fracture of radial head 01/29/2019   Prediabetes 10/19/2018   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) ([place]) 10/19/2018   Insomnia 10/19/2018   Elbow pain, chronic, left 10/19/2018   Dyspnea 10/12/2017   Pancolitis ([place]) 05/20/2017   H/O Spinal surgery 01/18/2015   Essential hypertension 01/18/2015   Obesity 01/18/2015   Past Medical History:  Diagnosis Date   Anxiety    Arthritis    Asthma    COPD (chronic obstructive pulmonary disease) ([place])    Fracture    closed displaced left radial head   Hypertension    Sleep apnea    does not wear CPAP    Family History  Problem Relation Age of Onset   Hypertension Mother    Stroke Mother  Cancer Father     Past Surgical History:  Procedure Laterality Date   BACK SURGERY     spinal stimulator   BREAST BIOPSY Right 2017   benign   COLONOSCOPY W/ BIOPSIES AND POLYPECTOMY     DILATION AND CURETTAGE OF UTERUS     LAPAROSCOPIC ROUX-EN-Y GASTRIC BYPASS WITH UPPER ENDOSCOPY AND REMOVAL OF LAP BAND     RADIAL HEAD ARTHROPLASTY Left 02/03/2019   Procedure: LEFT RADIAL HEAD ARTHROPLASTY;  Surgeon: [name], MD;  Location: [facility];  Service: Orthopedics;  Laterality: Left;  AXILLARY BLOCK VS BIER BLOCK   TUBAL LIGATION     Social History   Occupational History   Not on file  Tobacco Use   Smoking status: Former    Packs/day: 0.50    Years: 10.00    Pack years: 5.00    Types: Cigarettes    Quit date: 06/15/1998    Years since quitting: 22.5   Smokeless tobacco: Never  Vaping Use   Vaping Use: Never used  Substance and Sexual Activity   Alcohol use: Never   Drug use: Never   Sexual activity: Not on file

## 2021-01-10 ENCOUNTER — Other Ambulatory Visit: Payer: Self-pay

## 2021-01-13 ENCOUNTER — Ambulatory Visit: Payer: Medicare Other | Admitting: Pulmonary Disease

## 2021-01-14 ENCOUNTER — Ambulatory Visit: Payer: Medicare Other | Admitting: Orthopaedic Surgery

## 2021-01-15 ENCOUNTER — Encounter: Payer: Self-pay | Admitting: Surgery

## 2021-01-15 ENCOUNTER — Telehealth: Payer: Self-pay | Admitting: Surgery

## 2021-01-15 ENCOUNTER — Ambulatory Visit (INDEPENDENT_AMBULATORY_CARE_PROVIDER_SITE_OTHER): Payer: Medicare Other | Admitting: Surgery

## 2021-01-15 ENCOUNTER — Other Ambulatory Visit: Payer: Self-pay

## 2021-01-15 ENCOUNTER — Telehealth: Payer: Self-pay | Admitting: *Deleted

## 2021-01-15 VITALS — BP 111/76 | HR 112 | Ht 68.0 in | Wt 275.6 lb

## 2021-01-15 DIAGNOSIS — M1612 Unilateral primary osteoarthritis, left hip: Secondary | ICD-10-CM

## 2021-01-15 NOTE — Telephone Encounter (Signed)
I called patient and advised, per Patty Howard, rx would be sent in the morning. She expressed understanding.  Please send prescription. Thanks.

## 2021-01-15 NOTE — Progress Notes (Signed)
Surgical Instructions    Your procedure is scheduled on 01/22/21.  Report to [facility] Main Entrance "A" at 10:30 A.M., then check in with the Admitting office.  Call this number if you have problems the morning of surgery:  [phone]   If you have any questions prior to your surgery date call [phone]: Open Monday-Friday 8am-4pm    Remember:  Do not eat after midnight the night before your surgery  You may drink clear liquids until 09:30am the morning of your surgery.   Clear liquids allowed are: Water, Non-Citrus Juices (without pulp), Carbonated Beverages, Clear Tea, Black Coffee Only, and Gatorade  Patient Instructions  The night before surgery:  No food after midnight. ONLY clear liquids after midnight  The day of surgery (if you do NOT have diabetes):  Drink ONE (1) Pre-Surgery Clear Ensure by 09:30am the morning of surgery. Drink in one sitting. Do not sip.  This drink was given to you during your hospital  pre-op appointment visit. Nothing else to drink after completing the  Pre-Surgery Clear Ensure.          If you have questions, please contact your surgeon's office.     Take these medicines the morning of surgery with A SIP OF WATER  amLODipine (NORVASC)  atorvastatin (LIPITOR) benzonatate (TESSALON)  Fluticasone-Umeclidin-Vilant (TRELEGY ELLIPTA) levothyroxine (SYNTHROID) linaclotide (LINZESS)   IF NEEDED: albuterol (PROVENTIL) (2.5 MG/3ML) albuterol (VENTOLIN HFA) Inhaler (bring with you the day of surgery) guaiFENesin-codeine ipratropium-albuterol (DUONEB) pantoprazole (PROTONIX) sodium chloride HYPERTONIC nebulizer solution  As of today, STOP taking any Aspirin (unless otherwise instructed by your surgeon) Aleve, Naproxen, Ibuprofen, Motrin, Advil, Goody's, BC's, all herbal medications, fish oil, and all vitamins.  Please stop taking Diethylpropion ASAP.           Do not wear jewelry or makeup Do not wear lotions, powders, perfumes, or  deodorant. Do not shave 48 hours prior to surgery.   Do not bring valuables to the hospital.  DO Not wear nail polish, gel polish, artificial nails, or any other type of covering on natural nails  including finger and toenails. If patients have artificial nails, gel coating, etc. that need to be removed by a nail salon please have this removed prior to surgery or surgery may need to be canceled/delayed if the surgeon/ anesthesia feels like the patient is unable to be adequately monitored.             [facility] is not responsible for any belongings or valuables.  Do NOT Smoke (Tobacco/Vaping) or drink Alcohol 24 hours prior to your procedure If you use a CPAP at night, you may bring all equipment for your overnight stay.   Contacts, glasses, dentures or bridgework may not be worn into surgery, please bring cases for these belongings   For patients admitted to the hospital, discharge time will be determined by your treatment team.   Patients discharged the day of surgery will not be allowed to drive home, and someone needs to stay with them for 24 hours.  ONLY 1 SUPPORT PERSON MAY BE PRESENT WHILE YOU ARE IN SURGERY. IF YOU ARE TO BE ADMITTED ONCE YOU ARE IN YOUR ROOM YOU WILL BE ALLOWED TWO (2) VISITORS.  Minor children may have two parents present. Special consideration for safety and communication needs will be reviewed on a case by case basis.  Special instructions:    Oral Hygiene is also important to reduce your risk of infection.  Remember - BRUSH YOUR TEETH THE  MORNING OF SURGERY WITH YOUR REGULAR TOOTHPASTE   Hall- Preparing For Surgery  Before surgery, you can play an important role. Because skin is not sterile, your skin needs to be as free of germs as possible. You can reduce the number of germs on your skin by washing with CHG (chlorahexidine gluconate) Soap before surgery.  CHG is an antiseptic cleaner which kills germs and bonds with the skin to continue killing  germs even after washing.     Please do not use if you have an allergy to CHG or antibacterial soaps. If your skin becomes reddened/irritated stop using the CHG.  Do not shave (including legs and underarms) for at least 48 hours prior to first CHG shower. It is OK to shave your face.  Please follow these instructions carefully.     Shower the NIGHT BEFORE SURGERY and the MORNING OF SURGERY with CHG Soap.   If you chose to wash your hair, wash your hair first as usual with your normal shampoo. After you shampoo, rinse your hair and body thoroughly to remove the shampoo.  Then [employer] and genitals (private parts) with your normal soap and rinse thoroughly to remove soap.  After that Use CHG Soap as you would any other liquid soap. You can apply CHG directly to the skin and wash gently with a scrungie or a clean washcloth.   Apply the CHG Soap to your body ONLY FROM THE NECK DOWN.  Do not use on open wounds or open sores. Avoid contact with your eyes, ears, mouth and genitals (private parts). Wash Face and genitals (private parts)  with your normal soap.   Wash thoroughly, paying special attention to the area where your surgery will be performed.  Thoroughly rinse your body with warm water from the neck down.  DO NOT shower/wash with your normal soap after using and rinsing off the CHG Soap.  Pat yourself dry with a CLEAN TOWEL.  Wear CLEAN PAJAMAS to bed the night before surgery  Place CLEAN SHEETS on your bed the night before your surgery  DO NOT SLEEP WITH PETS.   Day of Surgery: Take a shower with CHG soap. Wear Clean/Comfortable clothing the morning of surgery Do not apply any deodorants/lotions.   Remember to brush your teeth WITH YOUR REGULAR TOOTHPASTE.   Please read over the following fact sheets that you were given.

## 2021-01-15 NOTE — Telephone Encounter (Signed)
Pt had an appt today and was told her oxycodone prescription was called in. Pt states no script called in. Please send medication to pharmacy on file. Pt phone number is [phone].

## 2021-01-15 NOTE — Telephone Encounter (Signed)
   [facility] Pre-operative Risk Assessment    Patient Name: Patty Howard  DOB: 08/10/1955 MRN: [id number]  HEARTCARE STAFF:  - IMPORTANT!!!!!! Under Visit Info/Reason for Call, type in Other and utilize the format Clearance MM/DD/YY or Clearance TBD. Do not use dashes or single digits. - Please review there is not already an duplicate clearance open for this procedure. - If request is for dental extraction, please clarify the # of teeth to be extracted. - If the patient is currently at the dentist's office, call Pre-Op Callback Staff (MA/nurse) to input urgent request.  - If the patient is not currently in the dentist office, please route to the Pre-Op pool.  Request for surgical clearance:  What type of surgery is being performed? LEFT TOTAL HIP ARTHROPLASTY   When is this surgery scheduled? TBD  What type of clearance is required (medical clearance vs. Pharmacy clearance to hold med vs. Both)? MEDICAL  Are there any medications that need to be held prior to surgery and how long? ASA   Practice name and name of physician performing surgery? [facility]; DR. MARK YATES  What is the office phone number? [phone]   7.   What is the office fax number? [place].   Anesthesia type (None, local, MAC, general) ? SPINAL   Julaine Hua 01/15/2021, 5:13 PM  _________________________________________________________________   (provider comments below)

## 2021-01-16 ENCOUNTER — Telehealth: Payer: Self-pay | Admitting: Orthopaedic Surgery

## 2021-01-16 ENCOUNTER — Encounter (HOSPITAL_COMMUNITY)
Admission: RE | Admit: 2021-01-16 | Discharge: 2021-01-16 | Disposition: A | Discharge status: Home / Self Care | Payer: Medicare Other | Source: Ambulatory Visit | Attending: Orthopaedic Surgery | Admitting: Orthopaedic Surgery

## 2021-01-16 ENCOUNTER — Ambulatory Visit (HOSPITAL_COMMUNITY)
Admission: RE | Admit: 2021-01-16 | Discharge: 2021-01-16 | Disposition: A | Discharge status: Home / Self Care | Payer: Medicare Other | Source: Ambulatory Visit | Attending: Surgery | Admitting: Surgery

## 2021-01-16 ENCOUNTER — Other Ambulatory Visit: Payer: Self-pay | Admitting: Surgery

## 2021-01-16 ENCOUNTER — Telehealth: Payer: Self-pay | Admitting: Primary Care

## 2021-01-16 ENCOUNTER — Other Ambulatory Visit: Payer: Self-pay | Admitting: Cardiology

## 2021-01-16 ENCOUNTER — Encounter (HOSPITAL_COMMUNITY): Payer: Self-pay

## 2021-01-16 DIAGNOSIS — Z01818 Encounter for other preprocedural examination: Secondary | ICD-10-CM | POA: Insufficient documentation

## 2021-01-16 HISTORY — DX: Gastro-esophageal reflux disease without esophagitis: K21.9

## 2021-01-16 LAB — COMPREHENSIVE METABOLIC PANEL
ALT: 21 U/L (ref 0–44)
AST: 18 U/L (ref 15–41)
Albumin: 4 g/dL (ref 3.5–5.0)
Alkaline Phosphatase: 94 U/L (ref 38–126)
Anion gap: 11 (ref 5–15)
BUN: 14 mg/dL (ref 8–23)
CO2: 27 mmol/L (ref 22–32)
Calcium: 9.6 mg/dL (ref 8.9–10.3)
Chloride: 100 mmol/L (ref 98–111)
Creatinine, Ser: 0.79 mg/dL (ref 0.44–1.00)
GFR, Estimated: >60 mL/min (ref >60)
Glucose, Bld: 108 mg/dL — ABNORMAL HIGH (ref 70–99)
Potassium: 3.1 mmol/L — ABNORMAL LOW (ref 3.5–5.1)
Sodium: 138 mmol/L (ref 135–145)
Total Bilirubin: 0.7 mg/dL (ref 0.3–1.2)
Total Protein: 6.9 g/dL (ref 6.5–8.1)

## 2021-01-16 LAB — CBC
HCT: 38.7 % (ref 36.0–46.0)
Hemoglobin: 12.5 g/dL (ref 12.0–15.0)
MCH: 26.9 pg (ref 26.0–34.0)
MCHC: 32.3 g/dL (ref 30.0–36.0)
MCV: 83.2 fL (ref 80.0–100.0)
Platelets: 396 10*3/uL (ref 150–400)
RBC: 4.65 MIL/uL (ref 3.87–5.11)
RDW: 14.6 % (ref 11.5–15.5)
WBC: 6.1 10*3/uL (ref 4.0–10.5)
nRBC: 0 % (ref 0.0–0.2)

## 2021-01-16 LAB — URINALYSIS, ROUTINE W REFLEX MICROSCOPIC
Bilirubin Urine: NEGATIVE
Glucose, UA: NEGATIVE mg/dL
Ketones, ur: NEGATIVE mg/dL
Leukocytes,Ua: NEGATIVE
Nitrite: NEGATIVE
Protein, ur: 30 mg/dL — AB
Specific Gravity, Urine: 1.024 (ref 1.005–1.030)
pH: 5 (ref 5.0–8.0)

## 2021-01-16 LAB — SURGICAL PCR SCREEN
MRSA, PCR: NEGATIVE
Staphylococcus aureus: NEGATIVE

## 2021-01-16 LAB — TYPE AND SCREEN
ABO/RH(D): O POS
Antibody Screen: NEGATIVE

## 2021-01-16 MED ORDER — HYDROCODONE-ACETAMINOPHEN 5-325 MG PO TABS: 1.0000 | ORAL_TABLET | Freq: Two times a day (BID) | ORAL | 0 refills | Status: DC | PRN

## 2021-01-16 NOTE — Telephone Encounter (Signed)
    Patient Name: Patty Howard  DOB: 03-30-1956 MRN: [id number]  Primary Cardiologist: Dr. Percival Spanish, MD   Chart reviewed as part of pre-operative protocol coverage. Given past medical history and time since last visit, based on ACC/AHA guidelines, [name] would be at acceptable risk for the planned procedure without further cardiovascular testing.   The patient was last seen in follow up 05/2020 at which time she was doing well. I personally spoke to her today and she reports no chest pain or SOB symptoms. She is able to complete greater than 4 METS without difficulty. She states that she has not been taking the ASA 62m QD for quite some time. She has no hx of CAD.   The patient was advised that if she develops new symptoms prior to surgery to contact our office to arrange for a follow-up visit, and she verbalized understanding.  I will route this recommendation to the requesting party via Epic fax function and remove from pre-op pool.  Please call with questions.  JKathyrn Drown NP 01/16/2021, 8:20 AM

## 2021-01-16 NOTE — Progress Notes (Signed)
PCP - Buzzy Han Cardiologist - Dr. Percival Spanish  PPM/ICD - denies Device Orders -  Rep Notified -   Chest x-ray - 01/16/21 EKG - 01/16/21 Stress Test - 12/20/19 ECHO - 11/26/19 Cardiac Cath - none  Sleep Study - 02/22/20 CPAP - yes  Fasting Blood Sugar - n/a Checks Blood Sugar _____ times a day  Blood Thinner Instructions:n/a Aspirin Instructions:n/a  ERAS Protcol -clears until 0930 PRE-SURGERY Ensure or G2- Ensure  COVID TEST- Pt needs to go to drive thru on 8/8 or 8/9. Instructions and directions given.    Anesthesia review: yes,cardiac history and diet drug.   Patient denies shortness of breath, fever, cough and chest pain at PAT appointment   All instructions explained to the patient, with a verbal understanding of the material. Patient agrees to go over the instructions while at home for a better understanding. Patient also instructed to self quarantine after being tested for COVID-19. The opportunity to ask questions was provided.

## 2021-01-16 NOTE — Telephone Encounter (Signed)
Pt called requesting pain medication. Please call pt about this matter and send to pharmacy on file. Pt phone number is [phone].

## 2021-01-16 NOTE — Telephone Encounter (Signed)
Rx sent into pharm. Patient aware.

## 2021-01-16 NOTE — Telephone Encounter (Signed)
[name] patient to let her know she needs an appointment unable to reach left message to call back.  When patient calls back please schedule her for first available for surgery clearance. [name] has seen her last

## 2021-01-16 NOTE — Telephone Encounter (Signed)
Fax received from Dr. Lorin Mercy to perform a left total hip arthroplasty with spinal anesthesia on patient. Surgery scheduled for 01/22/21.  Patient needs surgery clearance. Patient was just seen 11/22/20.. Office protocol is a risk assessment can be sent to surgeon if patient has been seen in 60 days or less.   Sending to Derl Barrow NP for risk assessment or recommendations if patient needs to be seen in office prior to surgical procedure.

## 2021-01-16 NOTE — Telephone Encounter (Signed)
She would need an in office visit for physical exam and risk assessment, during her last visit she had having some respiratory symptoms and had a break in CPAP therapy

## 2021-01-17 ENCOUNTER — Telehealth: Payer: Self-pay | Admitting: Primary Care

## 2021-01-17 NOTE — Progress Notes (Addendum)
Anesthesia Chart Review:  Case: [id number] Date/Time: 01/22/21 1215   Procedure: LEFT TOTAL HIP ARTHROPLASTY ANTERIOR APPROACH (Left: Hip)   Anesthesia type: Spinal   Pre-op diagnosis: left hip osteoarthritis   Location: [facility] OR ROOM 03 / [place] [facility]   Surgeons: [name], MD       DISCUSSION: Patient is a 65 year old female scheduled for the above procedure.  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  BMI is consistent with obesity.  Based on notes, she moved to [facility] from [place] around 10/2018 and prior to that lived in [place].  Preoperative cardiology input outlined by Kathyrn Drown, NP on 01/16/2021: "Given past medical history and time since last visit, based on ACC/AHA guidelines, Patty Howard would be at acceptable risk for the planned procedure without further cardiovascular testing.   The patient was last seen in follow up 05/2020 at which time she was doing well. I personally spoke to her today and she reports no chest pain or SOB symptoms. She is able to complete greater than 4 METS without difficulty. She states that she has not been taking the ASA 19m QD for quite some time. She has no hx of CAD..."   Patient has preoperative pulmonary risk assessment on 01/20/2021 by GBarbaraann Barthel NP.  She is to get presurgical COVID-19 testing on 01/20/21 or 01/21/21. Chart will be left for follow-up pulmonary input.    ADDENDUM 01/20/21 2:00 PM:  Patient evaluated today by GEric Form NP for pulmonology preoperative evaluation.  Patient completed last prednisone taper 2 to 3 weeks ago.  She has not required rescue inhaler.  She felt at her baseline from a respiratory standpoint.  1.8% increased risk of pulmonary complications per [place] Postperative Pulmonary Risk Score - for mech ventilation dependence >48h. Recommendations included: "1. Short duration of surgery as much as possible and avoid paralytic if possible 2. Recovery in step down or ICU with Pulmonary consultation 3. DVT prophylaxis 4. Aggressive pulmonary toilet with o2, bronchodilatation, ( order scheduled nebs twice daily) and incentive spirometry, flutter valve  and early ambulation 5. Needs to Wear CPAP immediately >>post op and with sleep. >> Take home mask to use in hospital. ( Home settings are 5-18 cm Pressure Auto set) 6. Minimize post op sedation, respiratory suppressing medications   7. Use Trelegy morning of Surgery, and have scheduled  albuterol nebs available for wheezing or shortness of breath."   VS: BP (!) 136/99   Pulse (!) 116   Temp 37.6 C (Oral)   Resp 19   Ht 5' 10"  (1.778 m)   Wt 124.8 kg   SpO2 98%   BMI 39.49 kg/m  HR 109 bpm on EKG. HR ~ 100 bpm at last two cardiology visits. Historically, heart rates have been tachycardic (several encounters reviewed HR ~ 95-123 bpm, but primarily low 100's-110's. BMI 40.   BP Readings from Last 3 Encounters:  01/16/21 (!) 136/99  01/15/21 111/76  01/07/21 (!) 155/77   Pulse Readings from Last 3 Encounters:  01/16/21 (!) 116  01/15/21 (!) 112  01/07/21 (!) 116     PROVIDERS: OBuzzy Han MD is PCP  HMinus Breeding MD is cardiologist OSherrilyn Rist MD is pulmonologist GSalvatore Marvel MD is allergist.  Last visit 07/23/20.  LABS: Labs reviewed: Acceptable for surgery. (all labs ordered are listed, but only abnormal results are displayed)  Labs Reviewed  COMPREHENSIVE METABOLIC PANEL - Abnormal; Notable for the following components:      Result Value   Potassium 3.1 (*)    Glucose, Bld 108 (*)    All other components within normal limits  URINALYSIS, ROUTINE W REFLEX MICROSCOPIC - Abnormal; Notable for the  following components:   APPearance CLOUDY (*)    Hgb urine dipstick SMALL (*)    Protein, ur 30 (*)    Bacteria, UA RARE (*)    All other components within normal limits  SURGICAL PCR SCREEN  CBC  TYPE AND SCREEN   Spirometry 07/24/20: FVC best 1.99 (62%). FEV1 best 1.50 (60%). FEV1R 0.75 (0.78). FEF25-75% 1.15 (50%). Interpretation: Moderate restriction.   IMAGES: CXR 01/16/21: FINDINGS: The heart size and mediastinal contours are within normal limits. Both lungs are clear. The visualized skeletal structures are unremarkable. IMPRESSION: No active cardiopulmonary disease.  CTA Chest 11/11/20: IMPRESSION: 1. No findings to suggest interstitial lung disease. No acute findings in the thorax. Scattered areas of mild post infectious or inflammatory scarring in the lungs, as above. 2. Small pulmonary nodules measuring 2-4 mm in size, stable compared to the prior study, considered definitively benign. 3. Aortic atherosclerosis. 4. Mild cardiomegaly.    EKG: 01/16/21: Sinus tachycardia at 109 bpm Possible Left atrial enlargement Nonspecific ST abnormality No significant change since last tracing Confirmed by Croitoru, Mihai ([id number]) on 01/16/2021 10:44:30 AM   CV: Nuclear stress test 12/20/19: The left ventricular ejection fraction is hyperdynamic (>65%). Nuclear stress EF: 65%. There was no ST segment deviation noted during stress. No T wave inversion was noted during stress. The study is normal. This is a low risk study.   1. Slightly reduced counts in the apex on rest and stress with normal wall motion consistent with apical thinning artifact. 2. Normal myocardial perfusion imaging study without ischemia or infarction. 3. TID ratio is upper limits of normal for pharmacological stress. Visually, there is no TID present. Suspect artifactual due to small LV cavity. 4. Normal LVEF, 65%. 5. This is a low-risk study.   - Report reviewed by cardiologist Dr. Percival Spanish and no further  ischemic work-up recommended at that time.    Echo 11/26/19: IMPRESSIONS   1. Left ventricular ejection fraction, by estimation, is 65 to 70%. The  left ventricle has normal function. The left ventricle has no regional  wall motion abnormalities. There is mild left ventricular hypertrophy.  Left ventricular diastolic parameters  are consistent with Grade I diastolic dysfunction (impaired relaxation).   2. Right ventricular systolic function is normal. The right ventricular  size is normal.   3. The mitral valve is normal in structure. No evidence of mitral valve  regurgitation.   4. The aortic valve is grossly normal. Aortic valve regurgitation is not  visualized.   Past Medical History:  Diagnosis Date   Anxiety    Arthritis    Asthma    COPD (chronic obstructive pulmonary disease) ([place])    Fracture    closed displaced left radial head   GERD (gastroesophageal reflux disease)    Hypertension    Sleep apnea    does not wear CPAP    Past Surgical History:  Procedure Laterality Date   BACK SURGERY     spinal stimulator   BREAST BIOPSY Right 2017   benign   COLONOSCOPY W/ BIOPSIES AND POLYPECTOMY     DILATION  AND CURETTAGE OF UTERUS     LAPAROSCOPIC ROUX-EN-Y GASTRIC BYPASS WITH UPPER ENDOSCOPY AND REMOVAL OF LAP BAND     RADIAL HEAD ARTHROPLASTY Left 02/03/2019   Procedure: LEFT RADIAL HEAD ARTHROPLASTY;  Surgeon: [name], MD;  Location: [facility];  Service: Orthopedics;  Laterality: Left;  AXILLARY BLOCK VS BIER BLOCK   TUBAL LIGATION      MEDICATIONS:  HYDROcodone-acetaminophen (NORCO/VICODIN) 5-325 MG tablet   albuterol (PROVENTIL) (2.5 MG/3ML) 0.083% nebulizer solution   albuterol (VENTOLIN HFA) 108 (90 Base) MCG/ACT inhaler   amLODipine (NORVASC) 5 MG tablet   ascorbic acid (VITAMIN C) 500 MG tablet   atorvastatin (LIPITOR) 10 MG tablet   benzonatate (TESSALON) 200 MG capsule   Diethylpropion HCl CR 75 MG TB24   Fluticasone-Umeclidin-Vilant (TRELEGY ELLIPTA)  200-62.5-25 MCG/INH AEPB   furosemide (LASIX) 40 MG tablet   guaiFENesin-codeine 100-10 MG/5ML syrup   HYDROcodone-acetaminophen (NORCO/VICODIN) 5-325 MG tablet   HYDROcodone-acetaminophen (NORCO/VICODIN) 5-325 MG tablet   ipratropium-albuterol (DUONEB) 0.5-2.5 (3) MG/3ML SOLN   levocetirizine (XYZAL) 5 MG tablet   levothyroxine (SYNTHROID) 100 MCG tablet   linaclotide (LINZESS) 290 MCG CAPS capsule   losartan-hydrochlorothiazide (HYZAAR) 100-25 MG tablet   montelukast (SINGULAIR) 10 MG tablet   Multiple Vitamin (MULTIVITAMIN WITH MINERALS) TABS tablet   pantoprazole (PROTONIX) 40 MG tablet   predniSONE (DELTASONE) 10 MG tablet   sodium chloride HYPERTONIC 3 % nebulizer solution   Vitamin D, Ergocalciferol, (DRISDOL) 1.25 MG (50000 UNIT) CAPS capsule   zolpidem (AMBIEN) 10 MG tablet    Benralizumab SOSY 30 mg   She is not currently taking prednisone per current medication list.   Myra Gianotti, PA-C Surgical Short Stay/Anesthesiology [facility] Phone [phone] [facility] Phone [phone] 01/17/2021 3:39 PM

## 2021-01-17 NOTE — H&P (Signed)
TOTAL HIP ADMISSION H&P  Patient is admitted for left total hip arthroplasty.  Subjective:  Chief Complaint: left hip pain  HPI: Patty Howard, 65 y.o. female, has a history of pain and functional disability in the left hip(s) due to arthritis and patient has failed non-surgical conservative treatments for greater than 12 weeks to include NSAID's and/or analgesics, supervised PT with diminished ADL's post treatment, use of assistive devices, and activity modification.  Onset of symptoms was gradual starting 10 years ago with gradually worsening course since that time.The patient noted no past surgery on the left hip(s).  Patient currently rates pain in the left hip at 10 out of 10 with activity. Patient has night pain, worsening of pain with activity and weight bearing, trendelenberg gait, pain with passive range of motion, and crepitus. Patient has evidence of subchondral cysts, subchondral sclerosis, periarticular osteophytes, and joint space narrowing by imaging studies. This condition presents safety issues increasing the risk of falls. .  There is no current active infection.  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   Diastolic dysfunction 28/78/6767   Asthma exacerbation 11/13/2020   Menopause 11/08/2020   Influenza 10/10/2020   Medication management 04/30/2020   Gastroesophageal reflux disease 03/14/2020   OSA (obstructive sleep apnea) 02/15/2020   Abnormal findings on diagnostic imaging of lung 01/08/2020   Educated about COVID-19 virus infection 11/30/2019   Aortic atherosclerosis ([place]) 11/30/2019   Chest pain 11/26/2019   Acute asthma exacerbation 05/22/2019   Left radial head fracture 02/03/2019   Closed fracture of radial head 01/29/2019   Prediabetes 10/19/2018   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)  ([place]) 10/19/2018   Insomnia 10/19/2018   Elbow pain, chronic, left 10/19/2018   Dyspnea 10/12/2017   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   H/O Spinal surgery 01/18/2015   Essential hypertension 01/18/2015   Obesity 01/18/2015   Past Medical History:  Diagnosis Date   Anxiety    Arthritis    Asthma    COPD (chronic obstructive pulmonary disease) ([place])    Fracture    closed displaced left radial head   GERD (gastroesophageal reflux disease)    Hypertension    Sleep apnea    does not wear CPAP    Past Surgical History:  Procedure Laterality Date   BACK SURGERY     spinal stimulator   BREAST BIOPSY Right 2017   benign   COLONOSCOPY W/ BIOPSIES AND POLYPECTOMY     DILATION AND CURETTAGE OF UTERUS     LAPAROSCOPIC ROUX-EN-Y GASTRIC BYPASS WITH UPPER ENDOSCOPY AND REMOVAL OF LAP BAND     RADIAL HEAD ARTHROPLASTY Left 02/03/2019   Procedure: LEFT RADIAL HEAD ARTHROPLASTY;  Surgeon: [name], MD;  Location: [facility];  Service: Orthopedics;  Laterality: Left;  AXILLARY BLOCK VS BIER BLOCK   TUBAL LIGATION      Current Facility-Administered Medications  Medication Dose Route Frequency Provider Last Rate Last Admin   Benralizumab SOSY 30 mg  30 mg Subcutaneous Q8 Toney Reil, MD   30 mg at 01/03/21 1015   Current Outpatient Medications  Medication Sig Dispense Refill Last Dose   albuterol (PROVENTIL) (2.5 MG/3ML) 0.083% nebulizer solution Take 2.5 mg by nebulization every 6 (six) hours as needed for wheezing or shortness of breath.      albuterol (VENTOLIN HFA) 108 (90 Base) MCG/ACT inhaler Inhale 1-2 puffs into  the lungs every 6 (six) hours as needed for wheezing or shortness of breath. 8 g 0    amLODipine (NORVASC) 5 MG tablet TAKE 1 TABLET (5 MG TOTAL) BY MOUTH DAILY. 90 tablet 3    ascorbic acid (VITAMIN C) 500 MG tablet Take 500 mg by mouth daily.      atorvastatin (LIPITOR) 10 MG tablet Take 10 mg by mouth daily.      Diethylpropion HCl CR 75 MG TB24 Take 75 mg by  mouth every morning.      Fluticasone-Umeclidin-Vilant (TRELEGY ELLIPTA) 200-62.5-25 MCG/INH AEPB Inhale 1 puff into the lungs daily. 30 each 5    furosemide (LASIX) 40 MG tablet Take 1 tablet (40 mg total) by mouth daily as needed for fluid or edema (WEIGHT GAIN OF 2LBS, SHORTNESS OF BREATH). (Patient taking differently: Take 40 mg by mouth daily.) 30 tablet 3    HYDROcodone-acetaminophen (NORCO/VICODIN) 5-325 MG tablet Take 1 tablet by mouth every 12 (twelve) hours as needed for moderate pain. 30 tablet 0    ipratropium-albuterol (DUONEB) 0.5-2.5 (3) MG/3ML SOLN Take 3 mLs by nebulization every 6 (six) hours as needed. (Patient taking differently: Take 3 mLs by nebulization every 6 (six) hours as needed (sob/wheezing).) 360 mL 3    levocetirizine (XYZAL) 5 MG tablet Take 1 tablet (5 mg total) by mouth every evening. 90 tablet 1    levothyroxine (SYNTHROID) 100 MCG tablet Take 100 mcg by mouth daily before breakfast.      linaclotide (LINZESS) 290 MCG CAPS capsule Take 290 mcg by mouth daily before breakfast.      losartan-hydrochlorothiazide (HYZAAR) 100-25 MG tablet Take 1 tablet by mouth daily. 90 tablet 3    montelukast (SINGULAIR) 10 MG tablet Take 1 tablet (10 mg total) by mouth at bedtime. 30 tablet 2    Multiple Vitamin (MULTIVITAMIN WITH MINERALS) TABS tablet Take 1 tablet by mouth daily. Centrum Multivitamin      pantoprazole (PROTONIX) 40 MG tablet TAKE 1 TABLET BY MOUTH EVERY DAY (Patient taking differently: Take 40 mg by mouth daily as needed (acid reflux).) 90 tablet 1    sodium chloride HYPERTONIC 3 % nebulizer solution Take by nebulization as needed for other or cough. Twice a day as needed for chest congestion (Patient taking differently: Take 4 mLs by nebulization as needed for other or cough.) 750 mL 5    Vitamin D, Ergocalciferol, (DRISDOL) 1.25 MG (50000 UNIT) CAPS capsule Take 50,000 Units by mouth every Monday.       zolpidem (AMBIEN) 10 MG tablet Take 10 mg by mouth at bedtime  as needed for sleep.      benzonatate (TESSALON) 200 MG capsule Take 1 capsule (200 mg total) by mouth 3 (three) times daily. 20 capsule 0    guaiFENesin-codeine 100-10 MG/5ML syrup Take 5 mLs by mouth every 4 (four) hours as needed for cough. 118 mL 0    HYDROcodone-acetaminophen (NORCO/VICODIN) 5-325 MG tablet Take 1 tablet by mouth every 6 (six) hours as needed for moderate pain. (Patient not taking: No sig reported) 20 tablet 0 Not Taking   HYDROcodone-acetaminophen (NORCO/VICODIN) 5-325 MG tablet Take 1 tablet by mouth every 12 (twelve) hours as needed for moderate pain. (Patient not taking: No sig reported) 20 tablet 0 Not Taking   predniSONE (DELTASONE) 10 MG tablet Prednisone 60 mg daily for 3 days, then 40 mg daily for 3 days, then 30 mg daily for 3 days, then 20 mg daily for 3 days then 10 mg daily  for 3 days then stop and follow-up with your lung specialist (Patient not taking: No sig reported) 45 tablet 0 Not Taking   Allergies  Allergen Reactions   Banana Hives and Itching    Social History   Tobacco Use   Smoking status: Former    Packs/day: 0.50    Years: 10.00    Pack years: 5.00    Types: Cigarettes    Quit date: 06/15/1998    Years since quitting: 22.6   Smokeless tobacco: Never  Substance Use Topics   Alcohol use: Never    Family History  Problem Relation Age of Onset   Hypertension Mother    Stroke Mother    Cancer Father      Review of Systems  Objective:  Physical Exam  Vital signs in last 24 hours:    Labs:   Estimated body mass index is 39.49 kg/m as calculated from the following:   Height as of 01/16/21: 5' 10"  (1.778 m).   Weight as of 01/16/21: 124.8 kg.   Imaging Review Plain radiographs demonstrate moderate degenerative joint disease of the left hip(s). The bone quality appears to be good for age and reported activity level.      Assessment/Plan:  End stage arthritis, left hip(s)  The patient history, physical examination, clinical  judgement of the provider and imaging studies are consistent with end stage degenerative joint disease of the left hip(s) and total hip arthroplasty is deemed medically necessary. The treatment options including medical management, injection therapy, arthroscopy and arthroplasty were discussed at length. The risks and benefits of total hip arthroplasty were presented and reviewed. The risks due to aseptic loosening, infection, stiffness, dislocation/subluxation,  thromboembolic complications and other imponderables were discussed.  The patient acknowledged the explanation, agreed to proceed with the plan and consent was signed. Patient is being admitted for inpatient treatment for surgery, pain control, PT, OT, prophylactic antibiotics, VTE prophylaxis, progressive ambulation and ADL's and discharge planning.The patient is planning to be discharged home with home health services

## 2021-01-17 NOTE — Telephone Encounter (Signed)
Noted.   Will forward to Lauren per new protocol.

## 2021-01-17 NOTE — Telephone Encounter (Signed)
Patty Ehrich, NP to Me       10:55 AM Note She would need an in office visit for physical exam and risk assessment, during her last visit she had having some respiratory symptoms and had a break in CPAP therapy     Previous note from Surgical clearance received by fax.  Thank you for making an appt with SG  Nothing further needed at this time. Papers are in Patty Howard's office surgical clearance folder, please let her know when Office note is signed she will fax.

## 2021-01-20 ENCOUNTER — Encounter: Payer: Self-pay | Admitting: Acute Care

## 2021-01-20 ENCOUNTER — Other Ambulatory Visit: Payer: Self-pay

## 2021-01-20 ENCOUNTER — Ambulatory Visit (INDEPENDENT_AMBULATORY_CARE_PROVIDER_SITE_OTHER): Payer: Medicare Other | Admitting: Acute Care

## 2021-01-20 ENCOUNTER — Other Ambulatory Visit: Payer: Self-pay | Admitting: Orthopaedic Surgery

## 2021-01-20 VITALS — BP 124/84 | HR 114 | Temp 97.8°F | Ht 70.0 in | Wt 279.6 lb

## 2021-01-20 DIAGNOSIS — G4733 Obstructive sleep apnea (adult) (pediatric): Secondary | ICD-10-CM | POA: Diagnosis not present

## 2021-01-20 DIAGNOSIS — J4551 Severe persistent asthma with (acute) exacerbation: Secondary | ICD-10-CM

## 2021-01-20 DIAGNOSIS — J441 Chronic obstructive pulmonary disease with (acute) exacerbation: Secondary | ICD-10-CM

## 2021-01-20 DIAGNOSIS — Z9989 Dependence on other enabling machines and devices: Secondary | ICD-10-CM

## 2021-01-20 NOTE — Progress Notes (Signed)
History of Present Illness Patty Howard is a 65 y.o. female  former smoker ( Quit 2000 with 5 pack year smoking history, with OSA, asthma with COPD, HTN, aortic arthrosclerosis, GERD, obesity.  She is followed by Dr. Ander Slade.     HPI:   65 year old female, former smoker quit in 2000 and (5-pack-year history).  History significant for OSA, asthma with COPD, hypertension, aortic arthrosclerosis, GERD, obesity.  Patient of Dr. Ander Slade, last seen by pulmonary NP 10/24/20. Spirometry in February 2022 showed moderate restriction. Maintained on Trelegey and CPAP. Ordered for CT chest in 6 months to monitor lung nodular opacities.   01/20/2021 Pt. Presents for follow up. She is here for surgical clearance for hip replacement which is scheduled for 01/22/2021 ( in 2 days) . Dr. Lorin Mercy is the surgeon. She takes Trelegy for her COPD, she wears CPAP for her OSA.  Down Load today reveals that she is  compliant with her CPAP. She has worn this 28 of the last 30 days, but rarely for 4 hours as is recommended. Average usage is 3 hours and 30 minutes. She does have a significant mask leak. . Settings are Auto Set 5-18 cm pressure. AHI is 9.0.  She is currently off prednisone, ( Completed taper 2-3 weeks ago) She takes 2 neb treatments a day, and has not been using her rescue inhaler. She feels she is at baseline from a respiratory standpoint.  She has lost weight , as [facility]. She states she is going to rehab post op.   1) RISK FOR PROLONGED MECHANICAL VENTILAION - > 48h  1A) Arozullah - Prolonged mech ventilation risk Arozullah Postperative Pulmonary Risk Score - for mech ventilation dependence >48h [employer], Ann Surg 2000, major non-cardiac surgery) Comment Score  Type of surgery - abd ao aneurysm (27), thoracic (21), neurosurgery / upper abdominal / vascular (21), neck (11) Hip replacement  Left total hip 65  Emergency Surgery - (11)  0  ALbumin < 3 or poor  nutritional state - (9) 3.8 0  BUN > 30 -  (8) 14 0  Partial or completely dependent functional status - (7) Using a walker 7  COPD -  (6)  6  Age - 67 to 69 (4), > 70  (6)  4  TOTAL  13  Risk Stratifcation scores  - < 10 (0.5%), 11-19 (1.8%), 20-27 (4.2%), 28-40 (10.1%), >40 (26.6%)  1.8%   1.8% increased risk of pulmonary complications   Risk ameliorating factors are noted below:   Major Pulmonary risks identified in the multifactorial risk analysis are but not limited to a) pneumonia; b) recurrent intubation risk; c) prolonged or recurrent acute respiratory failure needing mechanical ventilation; d) prolonged hospitalization; e) DVT/Pulmonary embolism; f) Acute Pulmonary edema  Recommend 1. Short duration of surgery as much as possible and avoid paralytic if possible 2. Recovery in step down or ICU with Pulmonary consultation 3. DVT prophylaxis 4. Aggressive pulmonary toilet with o2, bronchodilatation, ( order scheduled nebs twice daily) and incentive spirometry, flutter valve  and early ambulation 5. Needs to Wear CPAP immediately >>post op and with sleep. >> Take home mask to use in hospital. ( Home settings are 5-18 cm Pressure Auto set) 6. Minimize post op sedation, respiratory suppressing medications   7. Use Trelegy morning of Surgery, and have scheduled  albuterol nebs available for wheezing or shortness of breath.  Post Op recommendation  Recommendations: - Continue Trelegy 200 one puff  daily in am - Continue Singulair 82m daily at bedtime - Continue mucinex 12015mtwice a day - Continue Fasenra injections per allergy >> Due again in September of 2022   Test Results:    CBC Latest Ref Rng & Units 01/16/2021 11/22/2020 11/12/2020  WBC 4.0 - 10.5 K/uL 6.1 14.9(H) 10.5  Hemoglobin 12.0 - 15.0 g/dL 12.5 11.3(L) 10.0(L)  Hematocrit 36.0 - 46.0 % 38.7 35.7(L) 33.0(L)  Platelets 150 - 400 K/uL 396 299.0 341    BMP Latest Ref Rng & Units 01/16/2021 11/22/2020 11/18/2020   Glucose 70 - 99 mg/dL 108(H) 105(H) -  BUN 8 - 23 mg/dL 14 23 -  Creatinine 0.44 - 1.00 mg/dL 0.79 0.88 0.99  BUN/Creat Ratio 12 - 28 - - -  Sodium 135 - 145 mmol/L 138 144 -  Potassium 3.5 - 5.1 mmol/L 3.1(L) 4.0 -  Chloride 98 - 111 mmol/L 100 107 -  CO2 22 - 32 mmol/L 27 28 -  Calcium 8.9 - 10.3 mg/dL 9.6 9.0 -    BNP    Component Value Date/Time   BNP 51.6 11/27/2019 0413    ProBNP No results found for: PROBNP  PFT No results found for: FEV1PRE, FEV1POST, FVCPRE, FVCPOST, TLC, DLCOUNC, PREFEV1FVCRT, PSTFEV1FVCRT  DG Chest 2 View  Result Date: 01/17/2021 CLINICAL DATA:  Pre-admission for left knee arthroplasty. EXAM: CHEST - 2 VIEW COMPARISON:  11/11/2020. FINDINGS: The heart size and mediastinal contours are within normal limits. Both lungs are clear. The visualized skeletal structures are unremarkable. IMPRESSION: No active cardiopulmonary disease. Electronically Signed   By: TaKerby Moors.D.   On: 01/17/2021 11:07   MR Hip Left w/o contrast  Result Date: 12/30/2020 CLINICAL DATA:  Chronic left hip pain.  No known injury. EXAM: MR OF THE LEFT HIP WITHOUT CONTRAST TECHNIQUE: Multiplanar, multisequence MR imaging was performed. No intravenous contrast was administered. COMPARISON:  Plain films left hip 12/09/2020. FINDINGS: Bones: There is subchondral edema about the left hip. No fracture, stress change or worrisome lesion is identified. No avascular necrosis of the femoral heads. Minimal subchondral edema is seen about the right hip. Articular cartilage and labrum Articular cartilage: Bone-on-bone joint space narrowing is present left. Labrum: Evaluation is limited but the left superior and anterior labrum appears severely degenerated. Joint or bursal effusion Joint effusion:  Small to moderate left hip effusion. Bursae: There is small volume of fluid in the trochanteric bursa bilaterally. Muscles and tendons Muscles and tendons: The left gluteus medius and minimus appear torn.  There is severe left gluteus minimus and moderate to moderately severe left gluteus medius fatty atrophy. Evaluation of the tendons is limited due to the patient's habitus. Dedicated imaging of the right hip is not provided but the right gluteus minimus appears completely torn with associated moderately severe fatty atrophy. Other findings Miscellaneous:   None. IMPRESSION: Severe left hip osteoarthritis with bone-on-bone joint space narrowing and subchondral edema about the joint. The left labrum is not well seen but the superior and anterior labrum appears severely degenerated and torn. The left gluteus medius and minimus appear completely torn with severe atrophy of the gluteus minimus and moderate to moderately severe fatty atrophy of the gluteus medius. Dedicated imaging of the right hip is not performed but the right gluteus minimus appears completely torn with associated moderate to moderately severe fatty atrophy. Electronically Signed   By: ThInge Rise.D.   On: 12/30/2020 20:17     Past medical hx Past Medical History:  Diagnosis Date  Anxiety    Arthritis    Asthma    COPD (chronic obstructive pulmonary disease) (HCC)    Fracture    closed displaced left radial head   GERD (gastroesophageal reflux disease)    Hypertension    Sleep apnea    does not wear CPAP     Social History   Tobacco Use   Smoking status: Former    Packs/day: 0.50    Years: 10.00    Pack years: 5.00    Types: Cigarettes    Quit date: 06/15/1998    Years since quitting: 22.6   Smokeless tobacco: Never  Vaping Use   Vaping Use: Never used  Substance Use Topics   Alcohol use: Never   Drug use: Never    Ms.[name] reports that she quit smoking about 22 years ago. Her smoking use included cigarettes. She has a 5.00 pack-year smoking history. She has never used smokeless tobacco. She reports that she does not drink alcohol and does not use drugs.  Tobacco Cessation: Former smoker , quit 2000  with a 5 pack year smoking history   Past surgical hx, Family hx, Social hx all reviewed.  Current Outpatient Medications on File Prior to Visit  Medication Sig   albuterol (PROVENTIL) (2.5 MG/3ML) 0.083% nebulizer solution Take 2.5 mg by nebulization every 6 (six) hours as needed for wheezing or shortness of breath.   albuterol (VENTOLIN HFA) 108 (90 Base) MCG/ACT inhaler Inhale 1-2 puffs into the lungs every 6 (six) hours as needed for wheezing or shortness of breath.   ascorbic acid (VITAMIN C) 500 MG tablet Take 500 mg by mouth daily.   atorvastatin (LIPITOR) 10 MG tablet Take 10 mg by mouth daily.   benzonatate (TESSALON) 200 MG capsule Take 1 capsule (200 mg total) by mouth 3 (three) times daily.   Diethylpropion HCl CR 75 MG TB24 Take 75 mg by mouth every morning.   Fluticasone-Umeclidin-Vilant (TRELEGY ELLIPTA) 200-62.5-25 MCG/INH AEPB Inhale 1 puff into the lungs daily.   guaiFENesin-codeine 100-10 MG/5ML syrup Take 5 mLs by mouth every 4 (four) hours as needed for cough.   HYDROcodone-acetaminophen (NORCO/VICODIN) 5-325 MG tablet Take 1 tablet by mouth every 6 (six) hours as needed for moderate pain.   HYDROcodone-acetaminophen (NORCO/VICODIN) 5-325 MG tablet Take 1 tablet by mouth every 12 (twelve) hours as needed for moderate pain.   HYDROcodone-acetaminophen (NORCO/VICODIN) 5-325 MG tablet Take 1 tablet by mouth every 12 (twelve) hours as needed for moderate pain.   ipratropium-albuterol (DUONEB) 0.5-2.5 (3) MG/3ML SOLN Take 3 mLs by nebulization every 6 (six) hours as needed. (Patient taking differently: Take 3 mLs by nebulization every 6 (six) hours as needed (sob/wheezing).)   levocetirizine (XYZAL) 5 MG tablet Take 1 tablet (5 mg total) by mouth every evening.   levothyroxine (SYNTHROID) 100 MCG tablet Take 100 mcg by mouth daily before breakfast.   linaclotide (LINZESS) 290 MCG CAPS capsule Take 290 mcg by mouth daily before breakfast.   losartan-hydrochlorothiazide (HYZAAR)  100-25 MG tablet Take 1 tablet by mouth daily.   montelukast (SINGULAIR) 10 MG tablet Take 1 tablet (10 mg total) by mouth at bedtime.   Multiple Vitamin (MULTIVITAMIN WITH MINERALS) TABS tablet Take 1 tablet by mouth daily. Centrum Multivitamin   pantoprazole (PROTONIX) 40 MG tablet TAKE 1 TABLET BY MOUTH EVERY DAY (Patient taking differently: Take 40 mg by mouth daily as needed (acid reflux).)   sodium chloride HYPERTONIC 3 % nebulizer solution Take by nebulization as needed for other or cough.  Twice a day as needed for chest congestion (Patient taking differently: Take 4 mLs by nebulization as needed for other or cough.)   Vitamin D, Ergocalciferol, (DRISDOL) 1.25 MG (50000 UNIT) CAPS capsule Take 50,000 Units by mouth every Monday.    zolpidem (AMBIEN) 10 MG tablet Take 10 mg by mouth at bedtime as needed for sleep.   amLODipine (NORVASC) 5 MG tablet TAKE 1 TABLET (5 MG TOTAL) BY MOUTH DAILY.   furosemide (LASIX) 40 MG tablet Take 1 tablet (40 mg total) by mouth daily as needed for fluid or edema (WEIGHT GAIN OF 2LBS, SHORTNESS OF BREATH). (Patient taking differently: Take 40 mg by mouth daily.)   Current Facility-Administered Medications on File Prior to Visit  Medication   Benralizumab SOSY 30 mg     Allergies  Allergen Reactions   Banana Hives and Itching    Review Of Systems:  Constitutional:   No  weight loss, night sweats,  Fevers, chills, fatigue, or  lassitude.  HEENT:   No headaches,  Difficulty swallowing,  Tooth/dental problems, or  Sore throat,                No sneezing, itching, ear ache, nasal congestion, post nasal drip,   CV:  No chest pain,  Orthopnea, PND, swelling in lower extremities, anasarca, dizziness, palpitations, syncope.   GI  No heartburn, indigestion, abdominal pain, nausea, vomiting, diarrhea, change in bowel habits, loss of appetite, bloody stools.   Resp: No shortness of breath with exertion or at rest.  No excess mucus, no productive cough,  No  non-productive cough,  No coughing up of blood.  No change in color of mucus.  No wheezing.  No chest wall deformity  Skin: no rash or lesions.  GU: no dysuria, change in color of urine, no urgency or frequency.  No flank pain, no hematuria   MS:  + Left hip pain,  joint pain or swelling.  +  decreased range of motion.  No back pain.  Psych:  No change in mood or affect. No depression or anxiety.  No memory loss.   Vital Signs BP 124/84 (BP Location: Left Wrist, Cuff Size: Normal)   Pulse (!) 114   Temp 97.8 F (36.6 C) (Oral)   Ht 5' 10"  (1.778 m)   Wt 279 lb 9.6 oz (126.8 kg)   SpO2 98%   BMI 40.12 kg/m    Physical Exam:  General- No distress,  A&O x3, pleasant ENT: No sinus tenderness, TM clear, pale nasal mucosa, no oral exudate,no post nasal drip, no LAN Cardiac: S1, S2, regular rate and rhythm, no murmur Chest: No wheeze/ rales/ dullness; no accessory muscle use, no nasal flaring, no sternal retractions, diminished per bases, prolonges expiration phase Abd.: Soft Non-tender, ND, BS +, Body mass index is 40.12 kg/m. Ext: No clubbing cyanosis, No edema, no obvious deformities Neuro:  normal strength, MAE x 4, A&O x 3, Deconditioned 2/2 hip pain, using a walker Skin: No rashes, warm and dry, NO lesions Psych: normal mood and behavior   Assessment/Plan  Surgical Clearance for L total hip replacement Increased risk of pulmonary complications post op with COPD/ Asthma and OSA on CPAP 1.8% increased risk for pulmonary complications  Plan Recommendations to decrease risk of complications  1. Short duration of surgery as much as possible and avoid paralytic if possible 2. Recovery in step down or ICU with Pulmonary consultation 3. DVT prophylaxis 4. Aggressive pulmonary toilet with o2, bronchodilatation, ( order scheduled nebs  twice daily) and incentive spirometry, flutter valve  and early ambulation 5. Needs to Wear CPAP immediately >>post op and with sleep or naps. >>  Take home mask to use in hospital. ( Home settings are 5-18 cm Pressure Auto set) 6. Minimize post op sedation, respiratory suppressing medications   7. Use Trelegy morning of Surgery, and have scheduled  albuterol nebs available for wheezing or shortness of breath.  ** Continue twice daily Albuterol neb treatments ** ** Oxygen saturation goals with COPD are 88-92%**  Post Op Medication recommendation  Recommendations: - Continue Trelegy 200 one puff daily in am - Continue Singulair 55m daily at bedtime - Continue mucinex 12032mtwice a day - Continue Fasenra injections per allergy >> Due again in September of 2022  Follow up with Dr. OlAnder Slade9/01/2021  ( as is already scheduled ) post op to ensure you are doing well.  Continue working on weight loss.Congratulations on you weight loss. Keep up the great work!  Please contact office for sooner follow up if symptoms do not improve or worsen or seek emergency care Good Luck with Surgery.     SaMagdalen SpatzNP 01/20/2021  11:28 AM

## 2021-01-20 NOTE — Telephone Encounter (Signed)
Patient seen by Eric Form today. Form and office note with risk assessment was faxed to surgeon. Nothing further needed at this time.

## 2021-01-20 NOTE — Patient Instructions (Addendum)
It is good to see you today We will clear you for surgery 01/22/2021 for left total hip replacement Please note the below listed recommendations to decrease risk of pulmonary complications post op: Recommend 1. Short duration of surgery as much as possible and avoid paralytic if possible 2. Recovery in step down or ICU with Pulmonary consultation 3. DVT prophylaxis 4. Aggressive pulmonary toilet with o2, bronchodilatation, ( order scheduled nebs twice daily) and incentive spirometry, flutter valve  and early ambulation 5. Needs to Wear CPAP immediately >>post op and with sleep. >> Take home mask to use in hospital. ( Home settings are 5-18 cm Pressure Auto set) 6. Minimize post op sedation, respiratory suppressing medications   7. Use Trelegy morning of Surgery, and have scheduled  albuterol nebs available for wheezing or shortness of breath.  ** Continue twice daily Albuterol neb treatments ** ** Oxygen saturation goals with COPD are 88-92%**  Post Op Medication recommendation  Recommendations: - Continue Trelegy 200 one puff daily in am - Continue Singulair 16m daily at bedtime - Continue mucinex 12028mtwice a day - Continue Fasenra injections per allergy >> Due again in September of 2022  Follow up with Dr. OlAnder Slade9/01/2021  ( as is already scheduled ) post op to ensure you are doing well.  Continue working on weight loss.Congratulations on you weight loss. Keep up the great work!  Please contact office for sooner follow up if symptoms do not improve or worsen or seek emergency care Good Luck with Surgery.

## 2021-01-20 NOTE — Anesthesia Preprocedure Evaluation (Addendum)
Anesthesia Evaluation  Patient identified by MRN, date of birth, ID band Patient awake    Reviewed: Allergy & Precautions, H&P , NPO status , Patient's Chart, lab work & pertinent test results, reviewed documented beta blocker date and time   Airway Mallampati: II  TM Distance: >3 FB Neck ROM: full    Dental no notable dental hx.    Pulmonary shortness of breath, asthma , sleep apnea and Continuous Positive Airway Pressure Ventilation , COPD, former smoker,  Spirometry on 11/15/18: FVC 1.47 (51%), FEV1 1.25 (55%), FEV1R 0.85 (108%), FEF 25-75% 1.34 (63%).   Pulmonary exam normal breath sounds clear to auscultation       Cardiovascular Exercise Tolerance: Good hypertension, Pt. on medications negative cardio ROS   Rhythm:regular Rate:Normal     Neuro/Psych negative neurological ROS  negative psych ROS   GI/Hepatic Neg liver ROS, PUD, GERD  ,  Endo/Other  Morbid obesity  Renal/GU negative Renal ROS  negative genitourinary   Musculoskeletal  (+) Arthritis , Osteoarthritis,    Abdominal   Peds  Hematology negative hematology ROS (+)   Anesthesia Other Findings MRI LUMBAR 5/22  Alignment:  Trace anterolisthesis at L4-L5 and L5-S1.  Vertebrae: Vertebral body heights are maintained. There is no substantial marrow edema. No suspicious osseous lesion.  Conus medullaris and cauda equina: Conus extends to the T12-L1 level. Conus and cauda equina appear normal.  Paraspinal and other soft tissues: Unremarkable.  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  Reproductive/Obstetrics negative OB ROS                          Anesthesia Physical Anesthesia Plan  ASA: 3  Anesthesia Plan: MAC and Spinal   Post-op Pain Management:    Induction: Intravenous  PONV Risk Score and Plan: 2  Airway Management Planned: Nasal Cannula and Natural Airway  Additional Equipment:  None  Intra-op Plan:   Post-operative Plan:   Informed Consent: I have reviewed the patients History and Physical, chart, labs and discussed the procedure including the risks, benefits and alternatives for the proposed anesthesia with the patient or authorized representative who has indicated his/her understanding and acceptance.     Dental Advisory Given  Plan Discussed with: CRNA and Anesthesiologist  Anesthesia Plan Comments: (PAT note written by Myra Gianotti, PA-C. Former smoker, COPD, OSA (CPAP), severe persistent asthma, HTN. BMI 40.  - Preoperative cardiology input outlined by Kathyrn Drown, NP on 01/16/2021: "Given past medical history and time since last visit, based on ACC/AHA guidelines,Ed Green-Thornerwould be at acceptable risk for the planned procedure without further cardiovascular testing.  The patient was last seen in follow up 05/2020 at which time she was doing well. I personally spoke to her today and she reports no chest pain or SOB symptoms. She is able to complete greater than 4 METS without difficulty. She states that she has not been taking the ASA 38m QD for quite some time. She has no hx of CAD..."  - Preoperative pulmonary risk assessment on 01/20/2021 by GBarbaraann Barthel NP. Last steroid taper ~ 3 weeks prior. Felt at pulmonary baseline. 1.8% increased risk of pulmonary complications per Arozullah Postperative Pulmonary Risk Score - for mech ventilation dependence >48h. Recommendations included: 1. Short duration of surgery as much as possible and avoid paralytic if possible 2. Recovery in step down or ICU with Pulmonary consultation 3. DVT prophylaxis 4. Aggressive pulmonary toilet with o2, bronchodilatation,( order scheduled nebs twice daily)and incentive spirometry,  flutter valveand early ambulation 5. Needs to Wear CPAP immediately >>post op and with sleep. >> Take home mask to use in hospital. ( Home settings are 5-18 cm Pressure Auto set) 6.  Minimize post op sedation, respiratory suppressing medications  7. Use Trelegy morning of Surgery, and havescheduledalbuterol nebs available for wheezing or shortness of breath.  )      Anesthesia Quick Evaluation

## 2021-01-21 LAB — SARS CORONAVIRUS 2 (TAT 6-24 HRS): SARS Coronavirus 2: NEGATIVE

## 2021-01-22 ENCOUNTER — Inpatient Hospital Stay (HOSPITAL_COMMUNITY): Payer: Medicare Other

## 2021-01-22 ENCOUNTER — Other Ambulatory Visit: Payer: Self-pay

## 2021-01-22 ENCOUNTER — Encounter (HOSPITAL_COMMUNITY)
Admission: RE | Disposition: A | Discharge status: Skilled Nursing Facility | Payer: Self-pay | Source: Home / Self Care | Attending: Orthopaedic Surgery

## 2021-01-22 ENCOUNTER — Telehealth: Payer: Self-pay | Admitting: Acute Care

## 2021-01-22 ENCOUNTER — Ambulatory Visit (HOSPITAL_COMMUNITY): Payer: Medicare Other

## 2021-01-22 ENCOUNTER — Encounter (HOSPITAL_COMMUNITY): Payer: Self-pay | Admitting: Orthopaedic Surgery

## 2021-01-22 ENCOUNTER — Ambulatory Visit (HOSPITAL_COMMUNITY): Payer: Medicare Other | Admitting: Vascular Surgery

## 2021-01-22 ENCOUNTER — Inpatient Hospital Stay (HOSPITAL_COMMUNITY)
Admission: RE | Admit: 2021-01-22 | Discharge: 2021-01-30 | DRG: 470 | Disposition: A | Discharge status: Skilled Nursing Facility | Payer: Medicare Other | Attending: Orthopaedic Surgery | Admitting: Orthopaedic Surgery

## 2021-01-22 ENCOUNTER — Ambulatory Visit (HOSPITAL_COMMUNITY): Payer: Medicare Other | Admitting: Anesthesiology

## 2021-01-22 DIAGNOSIS — M1612 Unilateral primary osteoarthritis, left hip: Secondary | ICD-10-CM | POA: Diagnosis present

## 2021-01-22 DIAGNOSIS — Z87891 Personal history of nicotine dependence: Secondary | ICD-10-CM

## 2021-01-22 DIAGNOSIS — Z20822 Contact with and (suspected) exposure to covid-19: Secondary | ICD-10-CM | POA: Diagnosis present

## 2021-01-22 DIAGNOSIS — Z96622 Presence of left artificial elbow joint: Secondary | ICD-10-CM | POA: Diagnosis present

## 2021-01-22 DIAGNOSIS — Z6841 Body Mass Index (BMI) 40.0 and over, adult: Secondary | ICD-10-CM | POA: Diagnosis not present

## 2021-01-22 DIAGNOSIS — R109 Unspecified abdominal pain: Secondary | ICD-10-CM | POA: Diagnosis not present

## 2021-01-22 DIAGNOSIS — I700 Atherosclerosis of aorta: Secondary | ICD-10-CM | POA: Diagnosis present

## 2021-01-22 DIAGNOSIS — Z79890 Hormone replacement therapy (postmenopausal): Secondary | ICD-10-CM | POA: Diagnosis not present

## 2021-01-22 DIAGNOSIS — J449 Chronic obstructive pulmonary disease, unspecified: Secondary | ICD-10-CM | POA: Diagnosis present

## 2021-01-22 DIAGNOSIS — K219 Gastro-esophageal reflux disease without esophagitis: Secondary | ICD-10-CM | POA: Diagnosis present

## 2021-01-22 DIAGNOSIS — G4733 Obstructive sleep apnea (adult) (pediatric): Secondary | ICD-10-CM | POA: Diagnosis present

## 2021-01-22 DIAGNOSIS — Z09 Encounter for follow-up examination after completed treatment for conditions other than malignant neoplasm: Secondary | ICD-10-CM

## 2021-01-22 DIAGNOSIS — Z9884 Bariatric surgery status: Secondary | ICD-10-CM

## 2021-01-22 DIAGNOSIS — Z79899 Other long term (current) drug therapy: Secondary | ICD-10-CM | POA: Diagnosis not present

## 2021-01-22 DIAGNOSIS — Z8249 Family history of ischemic heart disease and other diseases of the circulatory system: Secondary | ICD-10-CM

## 2021-01-22 DIAGNOSIS — Z419 Encounter for procedure for purposes other than remedying health state, unspecified: Secondary | ICD-10-CM

## 2021-01-22 DIAGNOSIS — Z823 Family history of stroke: Secondary | ICD-10-CM

## 2021-01-22 HISTORY — PX: TOTAL HIP ARTHROPLASTY: SHX124

## 2021-01-22 LAB — ABO/RH: ABO/RH(D): O POS

## 2021-01-22 SURGERY — ARTHROPLASTY, HIP, TOTAL, ANTERIOR APPROACH
Anesthesia: Monitor Anesthesia Care | Site: Hip | Laterality: Left

## 2021-01-22 MED ORDER — OXYCODONE HCL 5 MG PO TABS
5.0000 mg | ORAL_TABLET | Freq: Once | ORAL | Status: AC | PRN
  Administered 2021-01-22: 5 mg via ORAL

## 2021-01-22 MED ORDER — BUPIVACAINE LIPOSOME 1.3 % IJ SUSP
INTRAMUSCULAR | Status: DC | PRN
  Administered 2021-01-22: 20 mL

## 2021-01-22 MED ORDER — LEVOTHYROXINE SODIUM 100 MCG PO TABS
100.0000 ug | ORAL_TABLET | Freq: Every day | ORAL | Status: DC
  Administered 2021-01-23 – 2021-01-30 (×7): 100 ug via ORAL
  Filled 2021-01-22 (×8): qty 1

## 2021-01-22 MED ORDER — LOSARTAN POTASSIUM 50 MG PO TABS
100.0000 mg | ORAL_TABLET | Freq: Every day | ORAL | Status: DC
  Administered 2021-01-23 – 2021-01-30 (×7): 100 mg via ORAL
  Filled 2021-01-22 (×9): qty 2

## 2021-01-22 MED ORDER — ORAL CARE MOUTH RINSE: 15.0000 mL | Freq: Once | OROMUCOSAL | Status: AC

## 2021-01-22 MED ORDER — ONDANSETRON HCL 4 MG/2ML IJ SOLN: 4.0000 mg | Freq: Once | INTRAMUSCULAR | Status: DC | PRN

## 2021-01-22 MED ORDER — GLYCOPYRROLATE PF 0.2 MG/ML IJ SOSY
PREFILLED_SYRINGE | INTRAMUSCULAR | Status: DC | PRN
  Administered 2021-01-22: .2 mg via INTRAVENOUS

## 2021-01-22 MED ORDER — UMECLIDINIUM BROMIDE 62.5 MCG/INH IN AEPB
1.0000 | INHALATION_SPRAY | Freq: Every day | RESPIRATORY_TRACT | Status: DC
  Administered 2021-01-23 – 2021-01-26 (×4): 1 via RESPIRATORY_TRACT
  Filled 2021-01-22 (×2): qty 7

## 2021-01-22 MED ORDER — BUPIVACAINE LIPOSOME 1.3 % IJ SUSP
INTRAMUSCULAR | Status: AC
  Filled 2021-01-22: qty 20

## 2021-01-22 MED ORDER — FUROSEMIDE 40 MG PO TABS: 40.0000 mg | ORAL_TABLET | Freq: Every day | ORAL | Status: DC | PRN

## 2021-01-22 MED ORDER — BENZONATATE 100 MG PO CAPS
200.0000 mg | ORAL_CAPSULE | Freq: Three times a day (TID) | ORAL | Status: DC
  Administered 2021-01-23 – 2021-01-30 (×22): 200 mg via ORAL
  Filled 2021-01-22 (×22): qty 2

## 2021-01-22 MED ORDER — PROPOFOL 10 MG/ML IV BOLUS
INTRAVENOUS | Status: AC
  Filled 2021-01-22: qty 20

## 2021-01-22 MED ORDER — LEVOCETIRIZINE DIHYDROCHLORIDE 5 MG PO TABS: 5.0000 mg | ORAL_TABLET | Freq: Every evening | ORAL | Status: DC

## 2021-01-22 MED ORDER — ACETAMINOPHEN 325 MG PO TABS
325.0000 mg | ORAL_TABLET | Freq: Four times a day (QID) | ORAL | Status: DC | PRN
  Administered 2021-01-25: 650 mg via ORAL
  Filled 2021-01-22: qty 2

## 2021-01-22 MED ORDER — MONTELUKAST SODIUM 10 MG PO TABS
10.0000 mg | ORAL_TABLET | Freq: Every day | ORAL | Status: DC
  Administered 2021-01-22 – 2021-01-29 (×8): 10 mg via ORAL
  Filled 2021-01-22 (×8): qty 1

## 2021-01-22 MED ORDER — CEFAZOLIN IN SODIUM CHLORIDE 3-0.9 GM/100ML-% IV SOLN
3.0000 g | INTRAVENOUS | Status: AC
  Administered 2021-01-22: 3 g via INTRAVENOUS
  Filled 2021-01-22: qty 100

## 2021-01-22 MED ORDER — PHENYLEPHRINE HCL-NACL 20-0.9 MG/250ML-% IV SOLN
INTRAVENOUS | Status: DC | PRN
  Administered 2021-01-22: 30 ug/min via INTRAVENOUS
  Administered 2021-01-22: 50 ug/min via INTRAVENOUS

## 2021-01-22 MED ORDER — ALBUTEROL SULFATE (2.5 MG/3ML) 0.083% IN NEBU
2.5000 mg | INHALATION_SOLUTION | Freq: Four times a day (QID) | RESPIRATORY_TRACT | Status: DC | PRN
  Administered 2021-01-22 – 2021-01-29 (×5): 2.5 mg via RESPIRATORY_TRACT
  Filled 2021-01-22 (×5): qty 3

## 2021-01-22 MED ORDER — POLYETHYLENE GLYCOL 3350 17 G PO PACK: 17.0000 g | PACK | Freq: Every day | ORAL | Status: DC | PRN

## 2021-01-22 MED ORDER — MIDAZOLAM HCL 5 MG/5ML IJ SOLN
INTRAMUSCULAR | Status: DC | PRN
  Administered 2021-01-22 (×2): 1 mg via INTRAVENOUS

## 2021-01-22 MED ORDER — LINACLOTIDE 145 MCG PO CAPS
290.0000 ug | ORAL_CAPSULE | Freq: Every day | ORAL | Status: DC
  Administered 2021-01-23 – 2021-01-30 (×6): 290 ug via ORAL
  Filled 2021-01-22 (×8): qty 2

## 2021-01-22 MED ORDER — FLUTICASONE-UMECLIDIN-VILANT 200-62.5-25 MCG/INH IN AEPB: 1.0000 | INHALATION_SPRAY | Freq: Every day | RESPIRATORY_TRACT | Status: DC

## 2021-01-22 MED ORDER — ALBUTEROL SULFATE HFA 108 (90 BASE) MCG/ACT IN AERS: 1.0000 | INHALATION_SPRAY | Freq: Four times a day (QID) | RESPIRATORY_TRACT | Status: DC | PRN

## 2021-01-22 MED ORDER — DIETHYLPROPION HCL ER 75 MG PO TB24: 75.0000 mg | ORAL_TABLET | Freq: Every morning | ORAL | Status: DC

## 2021-01-22 MED ORDER — GUAIFENESIN-CODEINE 100-10 MG/5ML PO SOLN
5.0000 mL | ORAL | Status: DC | PRN
  Administered 2021-01-24 – 2021-01-30 (×3): 5 mL via ORAL
  Filled 2021-01-22 (×3): qty 5

## 2021-01-22 MED ORDER — PROPOFOL 10 MG/ML IV BOLUS
INTRAVENOUS | Status: DC | PRN
  Administered 2021-01-22: 40 mg via INTRAVENOUS

## 2021-01-22 MED ORDER — SODIUM CHLORIDE 0.9 % IV SOLN: INTRAVENOUS | Status: DC

## 2021-01-22 MED ORDER — ONDANSETRON HCL 4 MG/2ML IJ SOLN: 4.0000 mg | Freq: Four times a day (QID) | INTRAMUSCULAR | Status: DC | PRN

## 2021-01-22 MED ORDER — BUPIVACAINE LIPOSOME 1.3 % IJ SUSP: 10.0000 mL | Freq: Once | INTRAMUSCULAR | Status: DC

## 2021-01-22 MED ORDER — ASCORBIC ACID 500 MG PO TABS
500.0000 mg | ORAL_TABLET | Freq: Every day | ORAL | Status: DC
  Administered 2021-01-23 – 2021-01-30 (×8): 500 mg via ORAL
  Filled 2021-01-22 (×8): qty 1

## 2021-01-22 MED ORDER — ONDANSETRON HCL 4 MG PO TABS: 4.0000 mg | ORAL_TABLET | Freq: Four times a day (QID) | ORAL | Status: DC | PRN

## 2021-01-22 MED ORDER — DOCUSATE SODIUM 100 MG PO CAPS
100.0000 mg | ORAL_CAPSULE | Freq: Two times a day (BID) | ORAL | Status: DC
  Administered 2021-01-22 – 2021-01-30 (×14): 100 mg via ORAL
  Filled 2021-01-22 (×16): qty 1

## 2021-01-22 MED ORDER — LACTATED RINGERS IV SOLN: INTRAVENOUS | Status: DC

## 2021-01-22 MED ORDER — CHLORHEXIDINE GLUCONATE 0.12 % MT SOLN
15.0000 mL | Freq: Once | OROMUCOSAL | Status: AC
  Administered 2021-01-22: 15 mL via OROMUCOSAL
  Filled 2021-01-22: qty 15

## 2021-01-22 MED ORDER — BUPIVACAINE HCL (PF) 0.25 % IJ SOLN
INTRAMUSCULAR | Status: AC
  Filled 2021-01-22: qty 20

## 2021-01-22 MED ORDER — ZOLPIDEM TARTRATE 5 MG PO TABS
5.0000 mg | ORAL_TABLET | Freq: Every evening | ORAL | Status: DC | PRN
  Administered 2021-01-23 – 2021-01-29 (×4): 5 mg via ORAL
  Filled 2021-01-22 (×5): qty 1

## 2021-01-22 MED ORDER — OXYCODONE HCL 5 MG/5ML PO SOLN
5.0000 mg | Freq: Once | ORAL | Status: AC | PRN
Start: 2021-01-22 — End: 2021-01-22

## 2021-01-22 MED ORDER — AMLODIPINE BESYLATE 5 MG PO TABS
5.0000 mg | ORAL_TABLET | Freq: Every day | ORAL | Status: DC
  Administered 2021-01-23 – 2021-01-30 (×7): 5 mg via ORAL
  Filled 2021-01-22 (×8): qty 1

## 2021-01-22 MED ORDER — FENTANYL CITRATE (PF) 100 MCG/2ML IJ SOLN
INTRAMUSCULAR | Status: AC
  Filled 2021-01-22: qty 2

## 2021-01-22 MED ORDER — MEPERIDINE HCL 25 MG/ML IJ SOLN: 6.2500 mg | INTRAMUSCULAR | Status: DC | PRN

## 2021-01-22 MED ORDER — LOSARTAN POTASSIUM-HCTZ 100-25 MG PO TABS: 1.0000 | ORAL_TABLET | Freq: Every day | ORAL | Status: DC

## 2021-01-22 MED ORDER — METOCLOPRAMIDE HCL 5 MG PO TABS: 5.0000 mg | ORAL_TABLET | Freq: Three times a day (TID) | ORAL | Status: DC | PRN

## 2021-01-22 MED ORDER — METOCLOPRAMIDE HCL 5 MG/ML IJ SOLN: 5.0000 mg | Freq: Three times a day (TID) | INTRAMUSCULAR | Status: DC | PRN

## 2021-01-22 MED ORDER — SODIUM CHLORIDE 3 % IN NEBU
4.0000 mL | INHALATION_SOLUTION | RESPIRATORY_TRACT | Status: AC | PRN
  Filled 2021-01-22: qty 4

## 2021-01-22 MED ORDER — MIDAZOLAM HCL 2 MG/2ML IJ SOLN
INTRAMUSCULAR | Status: AC
  Filled 2021-01-22: qty 2

## 2021-01-22 MED ORDER — ACETAMINOPHEN 325 MG PO TABS: 325.0000 mg | ORAL_TABLET | ORAL | Status: DC | PRN

## 2021-01-22 MED ORDER — HYDROCODONE-ACETAMINOPHEN 7.5-325 MG PO TABS
1.0000 | ORAL_TABLET | ORAL | Status: DC | PRN
  Administered 2021-01-22 – 2021-01-26 (×7): 2 via ORAL
  Administered 2021-01-26 (×2): 1 via ORAL
  Administered 2021-01-27 – 2021-01-29 (×7): 2 via ORAL
  Administered 2021-01-29: 1 via ORAL
  Administered 2021-01-30: 2 via ORAL
  Filled 2021-01-22: qty 1
  Filled 2021-01-22: qty 2
  Filled 2021-01-22: qty 1
  Filled 2021-01-22: qty 2
  Filled 2021-01-22: qty 1
  Filled 2021-01-22 (×13): qty 2

## 2021-01-22 MED ORDER — LORATADINE 10 MG PO TABS
10.0000 mg | ORAL_TABLET | Freq: Every day | ORAL | Status: DC
  Administered 2021-01-23 – 2021-01-30 (×8): 10 mg via ORAL
  Filled 2021-01-22 (×8): qty 1

## 2021-01-22 MED ORDER — ALBUMIN HUMAN 5 % IV SOLN: INTRAVENOUS | Status: DC | PRN

## 2021-01-22 MED ORDER — BUPIVACAINE IN DEXTROSE 0.75-8.25 % IT SOLN
INTRATHECAL | Status: DC | PRN
  Administered 2021-01-22: 2 mL via INTRATHECAL

## 2021-01-22 MED ORDER — TRANEXAMIC ACID-NACL 1000-0.7 MG/100ML-% IV SOLN
INTRAVENOUS | Status: AC
  Filled 2021-01-22: qty 100

## 2021-01-22 MED ORDER — AMLODIPINE BESYLATE 5 MG PO TABS
5.0000 mg | ORAL_TABLET | Freq: Once | ORAL | Status: AC
  Administered 2021-01-22: 5 mg via ORAL
  Filled 2021-01-22: qty 1

## 2021-01-22 MED ORDER — PHENYLEPHRINE HCL (PRESSORS) 10 MG/ML IV SOLN
INTRAVENOUS | Status: DC | PRN
  Administered 2021-01-22: 200 ug via INTRAVENOUS
  Administered 2021-01-22 (×2): 80 ug via INTRAVENOUS
  Administered 2021-01-22: 200 ug via INTRAVENOUS

## 2021-01-22 MED ORDER — IPRATROPIUM-ALBUTEROL 0.5-2.5 (3) MG/3ML IN SOLN: 3.0000 mL | Freq: Four times a day (QID) | RESPIRATORY_TRACT | Status: DC | PRN

## 2021-01-22 MED ORDER — HYDROCHLOROTHIAZIDE 25 MG PO TABS
25.0000 mg | ORAL_TABLET | Freq: Every day | ORAL | Status: DC
  Administered 2021-01-23 – 2021-01-30 (×8): 25 mg via ORAL
  Filled 2021-01-22 (×8): qty 1

## 2021-01-22 MED ORDER — TRANEXAMIC ACID-NACL 1000-0.7 MG/100ML-% IV SOLN
INTRAVENOUS | Status: DC | PRN
  Administered 2021-01-22: 1000 mg via INTRAVENOUS

## 2021-01-22 MED ORDER — FENTANYL CITRATE (PF) 100 MCG/2ML IJ SOLN
INTRAMUSCULAR | Status: AC
  Administered 2021-01-22: 50 ug via INTRAVENOUS
  Filled 2021-01-22: qty 2

## 2021-01-22 MED ORDER — PHENOL 1.4 % MT LIQD: 1.0000 | OROMUCOSAL | Status: DC | PRN

## 2021-01-22 MED ORDER — FENTANYL CITRATE (PF) 250 MCG/5ML IJ SOLN
INTRAMUSCULAR | Status: AC
  Filled 2021-01-22: qty 5

## 2021-01-22 MED ORDER — FLUTICASONE FUROATE-VILANTEROL 200-25 MCG/INH IN AEPB
1.0000 | INHALATION_SPRAY | Freq: Every day | RESPIRATORY_TRACT | Status: DC
  Administered 2021-01-23 – 2021-01-26 (×4): 1 via RESPIRATORY_TRACT
  Filled 2021-01-22 (×2): qty 28

## 2021-01-22 MED ORDER — ATORVASTATIN CALCIUM 10 MG PO TABS
10.0000 mg | ORAL_TABLET | Freq: Every day | ORAL | Status: DC
  Administered 2021-01-23 – 2021-01-30 (×8): 10 mg via ORAL
  Filled 2021-01-22 (×8): qty 1

## 2021-01-22 MED ORDER — MENTHOL 3 MG MT LOZG: 1.0000 | LOZENGE | OROMUCOSAL | Status: DC | PRN

## 2021-01-22 MED ORDER — FENTANYL CITRATE (PF) 100 MCG/2ML IJ SOLN
25.0000 ug | INTRAMUSCULAR | Status: DC | PRN
  Administered 2021-01-22 (×2): 50 ug via INTRAVENOUS

## 2021-01-22 MED ORDER — OXYCODONE HCL 5 MG PO TABS
ORAL_TABLET | ORAL | Status: AC
  Filled 2021-01-22: qty 1

## 2021-01-22 MED ORDER — VITAMIN D (ERGOCALCIFEROL) 1.25 MG (50000 UNIT) PO CAPS
50000.0000 [IU] | ORAL_CAPSULE | ORAL | Status: DC
  Administered 2021-01-27: 50000 [IU] via ORAL
  Filled 2021-01-22 (×3): qty 1

## 2021-01-22 MED ORDER — ONDANSETRON HCL 4 MG/2ML IJ SOLN
INTRAMUSCULAR | Status: DC | PRN
  Administered 2021-01-22: 4 mg via INTRAVENOUS

## 2021-01-22 MED ORDER — ASPIRIN EC 325 MG PO TBEC
325.0000 mg | DELAYED_RELEASE_TABLET | Freq: Every day | ORAL | Status: DC
  Administered 2021-01-23 – 2021-01-30 (×8): 325 mg via ORAL
  Filled 2021-01-22 (×8): qty 1

## 2021-01-22 MED ORDER — EPHEDRINE SULFATE 50 MG/ML IJ SOLN
INTRAMUSCULAR | Status: DC | PRN
  Administered 2021-01-22: 10 mg via INTRAVENOUS
  Administered 2021-01-22: 5 mg via INTRAVENOUS
  Administered 2021-01-22: 10 mg via INTRAVENOUS
  Administered 2021-01-22: 5 mg via INTRAVENOUS
  Administered 2021-01-22: 10 mg via INTRAVENOUS
  Administered 2021-01-22: 5 mg via INTRAVENOUS
  Administered 2021-01-22: 10 mg via INTRAVENOUS

## 2021-01-22 MED ORDER — PANTOPRAZOLE SODIUM 40 MG PO TBEC: 40.0000 mg | DELAYED_RELEASE_TABLET | Freq: Every day | ORAL | Status: DC | PRN

## 2021-01-22 MED ORDER — IPRATROPIUM-ALBUTEROL 0.5-2.5 (3) MG/3ML IN SOLN
3.0000 mL | Freq: Four times a day (QID) | RESPIRATORY_TRACT | Status: DC | PRN
  Administered 2021-01-30: 3 mL via RESPIRATORY_TRACT
  Filled 2021-01-22: qty 3

## 2021-01-22 MED ORDER — METHOCARBAMOL 500 MG PO TABS
ORAL_TABLET | ORAL | Status: AC
  Administered 2021-01-22: 500 mg
  Filled 2021-01-22: qty 1

## 2021-01-22 MED ORDER — PROPOFOL 500 MG/50ML IV EMUL
INTRAVENOUS | Status: DC | PRN
  Administered 2021-01-22: 150 ug/kg/min via INTRAVENOUS

## 2021-01-22 MED ORDER — ACETAMINOPHEN 160 MG/5ML PO SOLN: 325.0000 mg | ORAL | Status: DC | PRN

## 2021-01-22 SURGICAL SUPPLY — 49 items
BAG COUNTER SPONGE SURGICOUNT (BAG) ×2 IMPLANT
BENZOIN TINCTURE PRP APPL 2/3 (GAUZE/BANDAGES/DRESSINGS) ×2 IMPLANT
BLADE SAW SGTL 18X1.27X75 (BLADE) ×2 IMPLANT
COVER SURGICAL LIGHT HANDLE (MISCELLANEOUS) ×2 IMPLANT
DRAPE C-ARM 42X72 X-RAY (DRAPES) ×2 IMPLANT
DRAPE IMP U-DRAPE 54X76 (DRAPES) ×2 IMPLANT
DRAPE STERI IOBAN 125X83 (DRAPES) ×2 IMPLANT
DRAPE U-SHAPE 47X51 STRL (DRAPES) ×6 IMPLANT
DRSG MEPILEX BORDER 4X12 (GAUZE/BANDAGES/DRESSINGS) ×2 IMPLANT
DURAPREP 26ML APPLICATOR (WOUND CARE) ×2 IMPLANT
ELECT CAUTERY BLADE 6.4 (BLADE) ×2 IMPLANT
ELECT REM PT RETURN 9FT ADLT (ELECTROSURGICAL) ×2
ELECTRODE REM PT RTRN 9FT ADLT (ELECTROSURGICAL) ×1 IMPLANT
ELIMINATOR HOLE APEX DEPUY (Hips) ×2 IMPLANT
FACESHIELD WRAPAROUND (MASK) ×4 IMPLANT
GLOVE SRG 8 PF TXTR STRL LF DI (GLOVE) ×2 IMPLANT
GLOVE SURG ORTHO LTX SZ7.5 (GLOVE) ×4 IMPLANT
GLOVE SURG UNDER POLY LF SZ8 (GLOVE) ×2
GOWN STRL REUS W/ TWL LRG LVL3 (GOWN DISPOSABLE) ×1 IMPLANT
GOWN STRL REUS W/ TWL XL LVL3 (GOWN DISPOSABLE) ×1 IMPLANT
GOWN STRL REUS W/TWL 2XL LVL3 (GOWN DISPOSABLE) ×2 IMPLANT
GOWN STRL REUS W/TWL LRG LVL3 (GOWN DISPOSABLE) ×1
GOWN STRL REUS W/TWL XL LVL3 (GOWN DISPOSABLE) ×1
HEAD M SROM 36MM PLUS 1.5 (Hips) ×1 IMPLANT
KIT BASIN OR (CUSTOM PROCEDURE TRAY) ×2 IMPLANT
KIT TURNOVER KIT B (KITS) ×2 IMPLANT
LINER NEUTRAL 52X36MM PLUS 4 (Liner) ×2 IMPLANT
MANIFOLD NEPTUNE II (INSTRUMENTS) ×2 IMPLANT
NEEDLE 18GX1X1/2 (RX/OR ONLY) (NEEDLE) ×2 IMPLANT
NEEDLE SPNL 18GX3.5 QUINCKE PK (NEEDLE) ×2 IMPLANT
NEEDLE SPNL 22GX3.5 QUINCKE BK (NEEDLE) ×2 IMPLANT
NS IRRIG 1000ML POUR BTL (IV SOLUTION) ×2 IMPLANT
PACK TOTAL JOINT (CUSTOM PROCEDURE TRAY) ×2 IMPLANT
PAD ARMBOARD 7.5X6 YLW CONV (MISCELLANEOUS) ×4 IMPLANT
PIN SECTOR W/GRIP ACE CUP 52MM (Hips) ×2 IMPLANT
SCREW PINN CAN 6.5X20 (Screw) ×2 IMPLANT
SROM M HEAD 36MM PLUS 1.5 (Hips) ×2 IMPLANT
STAPLER VISISTAT 35W (STAPLE) ×2 IMPLANT
STEM FEMORAL SZ6 HIGH ACTIS (Stem) ×2 IMPLANT
SUT VIC AB 0 CT1 27 (SUTURE) ×2
SUT VIC AB 0 CT1 27XBRD ANBCTR (SUTURE) ×2 IMPLANT
SUT VIC AB 2-0 CT1 27 (SUTURE) ×2
SUT VIC AB 2-0 CT1 TAPERPNT 27 (SUTURE) ×2 IMPLANT
SUT VLOC 180 0 24IN GS25 (SUTURE) ×2 IMPLANT
SYR 30ML SLIP (SYRINGE) ×2 IMPLANT
SYR 50ML LL SCALE MARK (SYRINGE) ×2 IMPLANT
TOWEL GREEN STERILE (TOWEL DISPOSABLE) ×4 IMPLANT
TOWEL GREEN STERILE FF (TOWEL DISPOSABLE) ×2 IMPLANT
WATER STERILE IRR 1000ML POUR (IV SOLUTION) ×2 IMPLANT

## 2021-01-22 NOTE — Interval H&P Note (Signed)
History and Physical Interval Note:  01/22/2021 12:26 PM  Patty Howard  has presented today for surgery, with the diagnosis of left hip osteoarthritis.  The various methods of treatment have been discussed with the patient and family. After consideration of risks, benefits and other options for treatment, the patient has consented to  Procedure(s): LEFT TOTAL HIP ARTHROPLASTY ANTERIOR APPROACH (Left) as a surgical intervention.  The patient's history has been reviewed, patient examined, no change in status, stable for surgery.  I have reviewed the patient's chart and labs.  Questions were answered to the patient's satisfaction.     Patty Howard

## 2021-01-22 NOTE — Telephone Encounter (Signed)
Disregard

## 2021-01-22 NOTE — Transfer of Care (Signed)
Immediate Anesthesia Transfer of Care Note  Patient: Joliyah Lippens  Procedure(s) Performed: LEFT TOTAL HIP ARTHROPLASTY ANTERIOR APPROACH (Left: Hip)  Patient Location: PACU  Anesthesia Type:Spinal  Level of Consciousness: awake, alert  and oriented  Airway & Oxygen Therapy: Patient Spontanous Breathing and Patient connected to face mask oxygen  Post-op Assessment: Report given to RN, Post -op Vital signs reviewed and stable and Post -op Vital signs reviewed and unstable, Anesthesiologist notified  Post vital signs: Reviewed and stable  Last Vitals:  Vitals Value Taken Time  BP 107/56 01/22/21 1440  Temp    Pulse 98 01/22/21 1444  Resp 25 01/22/21 1444  SpO2 100 % 01/22/21 1444  Vitals shown include unvalidated device data.  Last Pain:  Vitals:   01/22/21 1053  TempSrc:   PainSc: 8       Patients Stated Pain Goal: 3 (36/62/94 7654)  Complications: No notable events documented.

## 2021-01-22 NOTE — Progress Notes (Addendum)
Patient ID: Patty Howard, female   DOB: [birth year], 65 y.o.   MRN: [id number]   Spoke with provider from [facility] Pulomonology.  They will do consult for patient as recommended by Eric Form NP's noted from 20 Jan 2021.  They will see patient today.  Advised patient currently in PACU 14.     ADDENDUM: January 22, 2021  4:36 PM  This is an addendum to my note above.  I was then on the PACU this afternoon and spoke with patient's nurse about treatment plan.  During that time the [facility] pulmonologist Dr. Kara Mead came in to do consult.  He spoke with patient's nurse briefly.  Physician then came to me advising that the [facility] pulmonology nurse practitioner Magnus Sinning recommendations in her preop clearance note from January 20, 2021 were "overkill".  Stated that he did not think that consult was needed and I did advise him that my attending physician Dr. Lorin Mercy was aware of the preop clearance recommendations and physician advised me that he would put in a note and orders.  I did speak with Eric Form NP to make her aware.  I had also spoken with my supervising physician Dr. Lorin Mercy and he is also aware of the situation.

## 2021-01-22 NOTE — Op Note (Signed)
Preop diagnosis: Left hip primary osteoarthritis  Postop diagnosis: Same  Procedure: Left total hip arthroplasty, direct anterior approach.  Surgeon: Lorin Mercy MD  Assistant: Benjiman Core, PA-C medically necessary and present for the entire procedure  Anesthesia: Spinal plus Exparel and Marcaine.  Implants:Implants   PIN SECTOR W/GRIP ACE CUP 52MM - [id number]  Inventory Item: PIN SECTOR W/GRIP ACE CUP 52MM Serial no.:  Model/Cat no.: [id number]  Implant name: PIN SECTOR W/GRIP ACE CUP 52MM - [id number] Laterality: Left Area: Hip  Manufacturer: [place] Date of Manufacture:    Action: Implanted Number Used: 1   Device Identifier:  Device Identifier TypeKathryne Hitch DEPUY - [id number]  Inventory Item: [facility] APEX DEPUY Serial no.:  Model/Cat no.: [id number]  Implant nameBrock Bad APEX DEPUY - [id number] Laterality: Left Area: Hip  Manufacturer: [place] Date of Manufacture:    Action: Implanted Number Used: 1   Device Identifier:  Device Identifier Type:     SCREW 20MM - [id number]  Inventory Item: SCREW 20MM Serial no.:  Model/Cat no.: [id number]  Implant name: SCREW 20MM - [id number] Laterality: Left Area: Hip  Manufacturer: [place] Date of Manufacture:    Action: Implanted Number Used: 1   Device Identifier:  Device Identifier Type:     LINER NEUTRAL 52X36MM PLUS 4 - [id number]  Inventory Item: LINER NEUTRAL 52X36MM PLUS 4 Serial no.:  Model/Cat no.: [id number]  Implant name: LINER NEUTRAL 52X36MM PLUS 4 - [id number] Laterality: Left Area: Hip  Manufacturer: [place] Date of Manufacture:    Action: Implanted Number Used: 1   Device Identifier:  Device Identifier Type:     STEM FEMORAL SZ6 HIGH ACTIS - [id number]  Inventory Item: STEM FEMORAL SZ6 HIGH ACTIS Serial no.:  Model/Cat no.: [id number]  Implant name: STEM FEMORAL SZ6 HIGH ACTIS - [id number] Laterality: Left Area: Hip  Manufacturer: [place]  Date of Manufacture:    Action: Implanted Number Used: 1   Device Identifier:  Device Identifier Type:     SROM M HEAD 36MM PLUS 1.5 - [id number]  Inventory Item: SROM M HEAD 36MM PLUS 1.5 Serial no.:  Model/Cat no.: [id number]  Implant name: SROM M HEAD 36MM PLUS 1.5 - [id number] Laterality: Left Area: Hip  Manufacturer: [place] Date of Manufacture:    Action: Implanted Number Used: 1   Device Identifier:  Device Identifier Type:    Procedure: After induction general anesthesia 3 g Ancef prophylaxis patient placed on the Hana table after Hana boots Foley catheter had been placed to decompress the bladder.  C-arm was brought in good visualization of both hips.  1015 drapes were applied DuraPrep usual total hip sheets drapes large shower curtain Betadine Steri-Drape and straight sheet across and 1 above.  Timeout procedure was completed.  Direct anterior approach was made 1 fingerbreadth lateral 1 inferior to ASIS obliquely the trochanter fascia was nicked extended dull cobra placed over the top the capsule.  Transverse arteries were coagulated anterior fat was removed capsule was opened and the deltoid was placed inside the capsule.  Continue removal of the anterior capsule.  Neck was cut with sterile visualization directly.  Head was removed with a corkscrew.  There was osteoarthritic bone-on-bone changes.  Sequential reaming up to 51 mm sized for 52.  +4 neutral liner 36 mm ball size was selected.  Hydraulic Cook applied leg was externally rotated 120 taken down under and the femur was sequentially prepared broaching up to size 6 stem placing a  size 6 stem after trial showed restoration of leg lengths.  She had slight increase shock related to abductor tendon tearing that was present.  Grossly there.  To be fiber still present.  MRI showed some intrasubstance tearing.  There was a good stability external rotation to 90 leg taken halfway down to the floor with no subluxation.  Slight shock  present.  No trochanteric impingement.  Permanent stem permanent metal ball was impacted hip was reduced identical findings of good stability irrigation V-Loc closure and fascia 2 on subtenons tissue.  Exparel Marcaine was infiltrated 10+10 = 20 cc and then skin staple closure postop dressing and transferred to the PACU.

## 2021-01-22 NOTE — Anesthesia Procedure Notes (Signed)
Spinal  Patient location during procedure: OR Start time: 01/22/2021 12:42 PM End time: 01/22/2021 12:46 PM Reason for block: surgical anesthesia Staffing Anesthesiologist: Janeece Riggers, MD Preanesthetic Checklist Completed: patient identified, IV checked, site marked, risks and benefits discussed, surgical consent, monitors and equipment checked, pre-op evaluation and timeout performed Spinal Block Patient position: sitting Prep: DuraPrep Patient monitoring: heart rate, cardiac monitor, continuous pulse ox and blood pressure Approach: midline Location: L2-3 Injection technique: single-shot Needle Needle type: Sprotte  Needle gauge: 24 G Needle length: 9 cm Assessment Sensory level: T4 Events: CSF return

## 2021-01-22 NOTE — Consult Note (Signed)
Name: Patty Howard MRN: [id number] DOB: 19-Oct-1955    ADMISSION DATE:  01/22/2021 CONSULTATION DATE:  01/22/2021  REFERRING MD :  Dutch Gray  CHIEF COMPLAINT: Postop management  BRIEF PATIENT DESCRIPTION: 65 year old remote smoker with asthma/COPD, OSA and benign pulmonary nodules status post left hip replacement.  PCCM consulted for postop pulmonary management  SIGNIFICANT EVENTS    STUDIES:  Spirometry 07/2020 -no evidence of airway obstruction, moderate restriction  HRCT chest 10/2020 no evidence of ILD, stable small pulmonary nodules considered benign   HISTORY OF PRESENT ILLNESS:  65 year old woman, former smoker (stopped 2000, 5 pack-year history) with PMHx significant for OSA, asthma/COPD (followed by Dr. Ander Slade), HTN, aortic atherosclerosis, GERD and obesity is seen in consultation at the request of Dr. Lorin Mercy for recommendations on further evaluation and management of COPD in the postoperative setting.   Patient is followed by Dr. Ander Slade, most recently seen 8/8 for surgical clearance prior to hip replacement. She is maintained on Trelegy and CPAP with most recent spirometry in 07/2020 demonstrating moderate restriction. She was scheduled to have CT Chest in ~6 months for monitoring of known lung nodular opacities.   Prior to surgery, patient reported good compliance with her Trelegy and CPAP use (wearing 3hr 56mn, 28/30 days, 5-18cm pressure). She had not recently required rescue inhaler use and prior to surgery was administering two nebulizer treatments daily.  PAST MEDICAL HISTORY :   has a past medical history of Anxiety, Arthritis, Asthma, COPD (chronic obstructive pulmonary disease) ([place], Fracture, GERD (gastroesophageal reflux disease), Hypertension, and Sleep apnea.  has a past surgical history that includes Laparoscopic roux-en-y gastric bypass with upper endoscopy and removal of lap band; Back surgery; Tubal ligation; Dilation and curettage of uterus;  Colonoscopy w/ biopsies and polypectomy; Radial head arthroplasty (Left, 02/03/2019); and Breast biopsy (Right, 2017). Prior to Admission medications   Medication Sig Start Date End Date Taking? Authorizing Provider  albuterol (PROVENTIL) (2.5 MG/3ML) 0.083% nebulizer solution Take 2.5 mg by nebulization every 6 (six) hours as needed for wheezing or shortness of breath.   Yes [provider]  albuterol (VENTOLIN HFA) 108 (90 Base) MCG/ACT inhaler Inhale 1-2 puffs into the lungs every 6 (six) hours as needed for wheezing or shortness of breath. 10/24/20  Yes WMartyn Ehrich NP  amLODipine (NORVASC) 5 MG tablet TAKE 1 TABLET (5 MG TOTAL) BY MOUTH DAILY. 09/24/20 01/22/21 Yes HMinus Breeding MD  ascorbic acid (VITAMIN C) 500 MG tablet Take 500 mg by mouth daily.   Yes [provider]  atorvastatin (LIPITOR) 10 MG tablet Take 10 mg by mouth daily.   Yes [provider]  Diethylpropion HCl CR 75 MG TB24 Take 75 mg by mouth every morning. 11/02/20  Yes [provider]  Fluticasone-Umeclidin-Vilant (TRELEGY ELLIPTA) 200-62.5-25 MCG/INH AEPB Inhale 1 puff into the lungs daily. 10/24/20  Yes WMartyn Ehrich NP  furosemide (LASIX) 40 MG tablet Take 1 tablet (40 mg total) by mouth daily as needed for fluid or edema (WEIGHT GAIN OF 2LBS, SHORTNESS OF BREATH). Patient taking differently: Take 40 mg by mouth daily. 06/10/20 01/22/21 Yes HMinus Breeding MD  HYDROcodone-acetaminophen (NORCO/VICODIN) 5-325 MG tablet Take 1 tablet by mouth every 12 (twelve) hours as needed for moderate pain. 01/16/21  Yes OLanae Crumbly PA-C  ipratropium-albuterol (DUONEB) 0.5-2.5 (3) MG/3ML SOLN Take 3 mLs by nebulization every 6 (six) hours as needed. Patient taking differently: Take 3 mLs by nebulization every 6 (six) hours as needed (sob/wheezing). 11/08/20  Yes WMartyn Ehrich  NP  levocetirizine (XYZAL) 5 MG tablet Take 1 tablet (5 mg total) by mouth every evening. 07/23/20  Yes Valentina Shaggy, MD  levothyroxine (SYNTHROID) 100 MCG tablet Take 100 mcg by mouth daily before breakfast.   Yes [provider]  linaclotide (LINZESS) 290 MCG CAPS capsule Take 290 mcg by mouth daily before breakfast.   Yes [provider]  losartan-hydrochlorothiazide (HYZAAR) 100-25 MG tablet Take 1 tablet by mouth daily. 05/24/20  Yes Minus Breeding, MD  montelukast (SINGULAIR) 10 MG tablet Take 1 tablet (10 mg total) by mouth at bedtime. 07/23/20  Yes Valentina Shaggy, MD  Multiple Vitamin (MULTIVITAMIN WITH MINERALS) TABS tablet Take 1 tablet by mouth daily. Centrum Multivitamin   Yes [provider]  pantoprazole (PROTONIX) 40 MG tablet TAKE 1 TABLET BY MOUTH EVERY DAY Patient taking differently: Take 40 mg by mouth daily as needed (acid reflux). 06/17/20  Yes Olalere, Adewale A, MD  sodium chloride HYPERTONIC 3 % nebulizer solution Take by nebulization as needed for other or cough. Twice a day as needed for chest congestion Patient taking differently: Take 4 mLs by nebulization as needed for other or cough. 11/22/20  Yes Martyn Ehrich, NP  Vitamin D, Ergocalciferol, (DRISDOL) 1.25 MG (50000 UNIT) CAPS capsule Take 50,000 Units by mouth every Monday.  10/27/19  Yes [provider]  zolpidem (AMBIEN) 10 MG tablet Take 10 mg by mouth at bedtime as needed for sleep.   Yes [provider]  benzonatate (TESSALON) 200 MG capsule Take 1 capsule (200 mg total) by mouth 3 (three) times daily. 11/18/20   Aline August, MD  guaiFENesin-codeine 100-10 MG/5ML syrup Take 5 mLs by mouth every 4 (four) hours as needed for cough. 11/18/20   Aline August, MD  HYDROcodone-acetaminophen (NORCO/VICODIN) 5-325 MG tablet Take 1 tablet by mouth every 6 (six) hours as needed for moderate pain. 12/23/20   [name], MD  HYDROcodone-acetaminophen (NORCO/VICODIN) 5-325 MG tablet Take 1 tablet by mouth every 12 (twelve) hours as needed for moderate pain. 01/03/21   Lanae Crumbly, PA-C   Allergies  Allergen Reactions   Banana Hives and Itching    FAMILY HISTORY:  family history includes Cancer in her father; Hypertension in her mother; Stroke in her mother. SOCIAL HISTORY:  reports that she quit smoking about 22 years ago. Her smoking use included cigarettes. She has a 5.00 pack-year smoking history. She has never used smokeless tobacco. She reports that she does not drink alcohol and does not use drugs.  REVIEW OF SYSTEMS:   Complains of left hip pain  Constitutional: Negative for fever, chills, weight loss, malaise/fatigue and diaphoresis.  HENT: Negative for hearing loss, ear pain, nosebleeds, congestion, sore throat, neck pain, tinnitus and ear discharge.   Eyes: Negative for blurred vision, double vision, photophobia, pain, discharge and redness.  Respiratory: Negative for cough, hemoptysis, sputum production, shortness of breath, wheezing and stridor.   Cardiovascular: Negative for chest pain, palpitations, orthopnea, claudication, leg swelling and PND.  Gastrointestinal: Negative for heartburn, nausea, vomiting, abdominal pain, diarrhea, constipation, blood in stool and melena.  Genitourinary: Negative for dysuria, urgency, frequency, hematuria and flank pain.  Musculoskeletal: Negative for myalgias, back pain, joint pain and falls.  Skin: Negative for itching and rash.  Neurological: Negative for dizziness, tingling, tremors, sensory change, speech change, focal weakness, seizures, loss of consciousness, weakness and headaches.  Endo/Heme/Allergies: Negative for environmental allergies and polydipsia. Does not bruise/bleed easily.  SUBJECTIVE:  Examined in PACU  VITAL SIGNS: Temp:  [98.1 F (36.7 C)-98.6 F (37 C)] 98.1 F (36.7 C) (08/10 1440) Pulse Rate:  [110] 110 (08/10 1042) Resp:  [19] 19 (08/10 1042) BP: (159)/(76) 159/76 (08/10 1042) SpO2:  [97 %] 97 % (08/10 1042) Weight:  [124.7 kg] 124.7 kg (08/10 1042)  PHYSICAL  EXAMINATION: Gen. Pleasant, obese, in no distress, normal affect ENT - no pallor,icterus, no post nasal drip, class 2 airway Neck: No JVD, no thyromegaly, no carotid bruits Lungs: no use of accessory muscles, no dullness to percussion, decreased without rales or rhonchi  Cardiovascular: Rhythm regular, heart sounds  normal, no murmurs or gallops, no peripheral edema Abdomen: soft and non-tender, no hepatosplenomegaly, BS normal. Musculoskeletal: No deformities, no cyanosis or clubbing Neuro:  alert, non focal, no tremors   Recent Labs  Lab 01/16/21 1059  NA 138  K 3.1*  CL 100  CO2 27  BUN 14  CREATININE 0.79  GLUCOSE 108*   Recent Labs  Lab 01/16/21 1059  HGB 12.5  HCT 38.7  WBC 6.1  PLT 396   DG C-Arm 1-60 Min  Result Date: 01/22/2021 CLINICAL DATA:  surgery EXAM: DG C-ARM 1-60 MIN; OPERATIVE LEFT HIP WITH PELVIS FLUOROSCOPY TIME:  Fluoroscopy Time:  14 seconds. Radiation Exposure Index (if provided by the fluoroscopic device): 3.38 mGy. Number of Acquired Spot Images: 5 COMPARISON:  12/29/2020. FINDINGS: Five C-arm fluoroscopic images were obtained intraoperatively and submitted for post operative interpretation. These images demonstrate left total hip arthroplasty. No unexpected findings. Normal alignment. Please see the performing provider's procedural report for further detail. IMPRESSION: Intraoperative fluoroscopy during left total hip arthroplasty. Electronically Signed   By: Margaretha Sheffield MD   On: 01/22/2021 14:29   DG HIP OPERATIVE UNILAT WITH PELVIS LEFT  Result Date: 01/22/2021 CLINICAL DATA:  surgery EXAM: DG C-ARM 1-60 MIN; OPERATIVE LEFT HIP WITH PELVIS FLUOROSCOPY TIME:  Fluoroscopy Time:  14 seconds. Radiation Exposure Index (if provided by the fluoroscopic device): 3.38 mGy. Number of Acquired Spot Images: 5 COMPARISON:  12/29/2020. FINDINGS: Five C-arm fluoroscopic images were obtained intraoperatively and submitted for post operative interpretation. These  images demonstrate left total hip arthroplasty. No unexpected findings. Normal alignment. Please see the performing provider's procedural report for further detail. IMPRESSION: Intraoperative fluoroscopy during left total hip arthroplasty. Electronically Signed   By: Margaretha Sheffield MD   On: 01/22/2021 14:29    ASSESSMENT / PLAN:  She has done well intraoperatively, spinal anesthesia was used.  She is only on 2 L nasal cannula with saturation 100%, has tolerated 1 dose of fentanyl well. She can be admitted to telemetry with continuous pulse ox monitoring for tonight given history of OSA. Not convinced that she has COPD, no evidence of airway obstruction, remote history of smoking.  Due to history of asthma, she is maintained on Fasenra injections per allergy service  Otherwise recommend -Early mobilization and DVT prophylaxis as would be routine post hip surgery -Can use duo nebs every 6 hours as needed and resume Trelegy on discharge -Use AutoSet CPAP 5 to 18 cm during sleep Will place above orders.  PCCM will be available as needed  Kara Mead MD. FCCP. [facility] Pager : [phone]  If no response to pager , please call [phone] until 7 pm After 7:00 pm call Elink  [phone]     01/22/2021, 4:06 PM

## 2021-01-22 NOTE — Progress Notes (Signed)
PT Cancellation Note  Patient Details Name: Patty Howard MRN: [id number] DOB: Sep 02, 1955   Cancelled Treatment:    Reason Eval/Treat Not Completed: Other (comment) Pt with later surgery and not to room at this time.  Will f/u tomorrow morning for therapy.  Abran Richard, PT Acute Rehab Services Pager [phone] [facility] [phone]   Karlton Lemon 01/22/2021, 5:19 PM

## 2021-01-22 NOTE — Anesthesia Postprocedure Evaluation (Signed)
Anesthesia Post Note  Patient: Patty Howard  Procedure(s) Performed: LEFT TOTAL HIP ARTHROPLASTY ANTERIOR APPROACH (Left: Hip)     Patient location during evaluation: PACU Anesthesia Type: MAC Level of consciousness: awake and alert Pain management: pain level controlled Vital Signs Assessment: post-procedure vital signs reviewed and stable Respiratory status: spontaneous breathing, nonlabored ventilation, respiratory function stable and patient connected to nasal cannula oxygen Cardiovascular status: stable and blood pressure returned to baseline Postop Assessment: no apparent nausea or vomiting Anesthetic complications: no   No notable events documented.  Last Vitals:  Vitals:   01/22/21 1810 01/22/21 2025  BP: (!) 120/96 (!) 141/78  Pulse: 95 (!) 106  Resp:  16  Temp: 37 C 37.1 C  SpO2: 97% 96%    Last Pain:  Vitals:   01/22/21 2025  TempSrc: Oral  PainSc:                  Arica Bevilacqua

## 2021-01-22 NOTE — Anesthesia Procedure Notes (Signed)
Procedure Name: MAC Date/Time: 01/22/2021 1:08 PM Performed by: Annamary Carolin, CRNA Pre-anesthesia Checklist: Patient identified, Emergency Drugs available, Suction available and Patient being monitored Patient Re-evaluated:Patient Re-evaluated prior to induction Oxygen Delivery Method: Simple face mask Preoxygenation: Pre-oxygenation with 100% oxygen Induction Type: IV induction Dental Injury: Teeth and Oropharynx as per pre-operative assessment

## 2021-01-22 NOTE — Progress Notes (Signed)
Patient arrived to the floor from PACU awake and alert. Patient is in stable condition, will continue to monitor. All information and history given to nightshift nurse Theodoro Kos.

## 2021-01-22 NOTE — Interval H&P Note (Signed)
History and Physical Interval Note:  01/22/2021 12:27 PM  Patty Howard  has presented today for surgery, with the diagnosis of left hip osteoarthritis.  The various methods of treatment have been discussed with the patient and family. After consideration of risks, benefits and other options for treatment, the patient has consented to  Procedure(s): LEFT TOTAL HIP ARTHROPLASTY ANTERIOR APPROACH (Left) as a surgical intervention.  The patient's history has been reviewed, patient examined, no change in status, stable for surgery.  I have reviewed the patient's chart and labs.  Questions were answered to the patient's satisfaction.     [name]

## 2021-01-23 ENCOUNTER — Encounter (HOSPITAL_COMMUNITY): Payer: Self-pay | Admitting: Orthopaedic Surgery

## 2021-01-23 LAB — BASIC METABOLIC PANEL
Anion gap: 11 (ref 5–15)
BUN: 9 mg/dL (ref 8–23)
CO2: 29 mmol/L (ref 22–32)
Calcium: 9.6 mg/dL (ref 8.9–10.3)
Chloride: 96 mmol/L — ABNORMAL LOW (ref 98–111)
Creatinine, Ser: 0.71 mg/dL (ref 0.44–1.00)
GFR, Estimated: >60 mL/min (ref >60)
Glucose, Bld: 140 mg/dL — ABNORMAL HIGH (ref 70–99)
Potassium: 2.9 mmol/L — ABNORMAL LOW (ref 3.5–5.1)
Sodium: 136 mmol/L (ref 135–145)

## 2021-01-23 LAB — CBC
HCT: 32.4 % — ABNORMAL LOW (ref 36.0–46.0)
Hemoglobin: 10.3 g/dL — ABNORMAL LOW (ref 12.0–15.0)
MCH: 27 pg (ref 26.0–34.0)
MCHC: 31.8 g/dL (ref 30.0–36.0)
MCV: 85 fL (ref 80.0–100.0)
Platelets: 327 10*3/uL (ref 150–400)
RBC: 3.81 MIL/uL — ABNORMAL LOW (ref 3.87–5.11)
RDW: 14.4 % (ref 11.5–15.5)
WBC: 12.2 10*3/uL — ABNORMAL HIGH (ref 4.0–10.5)
nRBC: 0 % (ref 0.0–0.2)

## 2021-01-23 MED ORDER — HYDROMORPHONE HCL 1 MG/ML IJ SOLN
1.0000 mg | INTRAMUSCULAR | Status: DC | PRN
  Administered 2021-01-23: 1 mg via INTRAVENOUS
  Filled 2021-01-23: qty 1

## 2021-01-23 MED ORDER — PROPOFOL 1000 MG/100ML IV EMUL
INTRAVENOUS | Status: AC
  Filled 2021-01-23: qty 500

## 2021-01-23 MED ORDER — PHENYLEPHRINE HCL-NACL 20-0.9 MG/250ML-% IV SOLN
INTRAVENOUS | Status: AC
  Filled 2021-01-23: qty 500

## 2021-01-23 MED ORDER — OXYCODONE-ACETAMINOPHEN 5-325 MG PO TABS
1.0000 | ORAL_TABLET | ORAL | Status: DC | PRN
  Administered 2021-01-23 – 2021-01-25 (×8): 2 via ORAL
  Filled 2021-01-23 (×9): qty 2

## 2021-01-23 MED ORDER — POTASSIUM CHLORIDE 20 MEQ PO PACK
40.0000 meq | PACK | Freq: Every day | ORAL | Status: DC
  Administered 2021-01-23 – 2021-01-30 (×8): 40 meq via ORAL
  Filled 2021-01-23 (×8): qty 2

## 2021-01-23 NOTE — Progress Notes (Signed)
Physical Therapy Treatment Patient Details Name: Patty Howard MRN: [id number] DOB: 1956-01-10 Today's Date: 01/23/2021    History of Present Illness 65 yo female with onset of OA that has not responded to nonsurgical conservative measures has been admitted for THA L LE with direct anterior approach, WBAT.  PMHx:  OA with subchondral sclerosis and osteophytes L hip, atherosclerosis, asthma, COPD, L radial head fracture, chestpain, pancolitis, HTN, anxiety, sleep apnea    PT Comments    Pt was up in chair when PT arrived, asked to use BSC.  Assisted her to [facility], then back to bed.  Pt has demonstrated better ability to assist with push off to power up when motivated, and looks interested in rehab stay to increase this independence.  Follow along with her to go to CIR unless this is declined by inpt rehab staff.  Pt was cautioned that the request was no guarantee of acceptance.  See for balance and control of gait to reduce fall risk, increase step length and progress to higher level gait challenges.   Follow Up Recommendations  CIR     Equipment Recommendations  None recommended by PT    Recommendations for Other Services Rehab consult     Precautions / Restrictions Precautions Precautions: Fall Precaution Comments: premedicate Restrictions Weight Bearing Restrictions: Yes LLE Weight Bearing: Weight bearing as tolerated    Mobility  Bed Mobility               General bed mobility comments: up in chair when PT arrived    Transfers Overall transfer level: Needs assistance Equipment used: Rolling walker (2 wheeled);1 person hand held assist Transfers: Sit to/from Stand Sit to Stand: Mod assist         General transfer comment: mod to power up and control initial balance  Ambulation/Gait Ambulation/Gait assistance: Min assist;Mod assist Gait Distance (Feet): 5 Feet Assistive device: Rolling walker (2 wheeled);1 person hand held assist Gait Pattern/deviations:  Step-through pattern Gait velocity: reduced   General Gait Details: stepping in transition to chairs, able to maintain better upright posture despite pain and fatigue   Stairs             Wheelchair Mobility    Modified Rankin (Stroke Patients Only)       Balance Overall balance assessment: Needs assistance Sitting-balance support: Feet supported Sitting balance-Leahy Scale: Fair     Standing balance support: Bilateral upper extremity supported;During functional activity Standing balance-Leahy Scale: Poor                              Cognition Arousal/Alertness: Awake/alert Behavior During Therapy: WFL for tasks assessed/performed Overall Cognitive Status: Within Functional Limits for tasks assessed                                 General Comments: following along with mobility instructions but physically limited      Exercises      General Comments General comments (skin integrity, edema, etc.): walking steps side of bed to get to [facility] and back to bed, up in recliner initially      Pertinent Vitals/Pain Pain Assessment: 0-10 Pain Score: 7  Pain Location: L hip Pain Descriptors / Indicators: Operative site guarding;Grimacing Pain Intervention(s): Monitored during session;Repositioned;RN gave pain meds during session    Home Living  Prior Function            PT Goals (current goals can now be found in the care plan section) Acute Rehab PT Goals Patient Stated Goal: to get to CIR and then home PT Goal Formulation: With patient Time For Goal Achievement: 02/06/21 Potential to Achieve Goals: Good Progress towards PT goals: Progressing toward goals    Frequency    7X/week      PT Plan      Co-evaluation              AM-PAC PT "6 Clicks" Mobility   Outcome Measure  Help needed turning from your back to your side while in a flat bed without using bedrails?: A Little Help needed  moving from lying on your back to sitting on the side of a flat bed without using bedrails?: A Little Help needed moving to and from a bed to a chair (including a wheelchair)?: A Lot Help needed standing up from a chair using your arms (e.g., wheelchair or bedside chair)?: A Lot Help needed to walk in hospital room?: A Little Help needed climbing 3-5 steps with a railing? : Total 6 Click Score: 14    End of Session Equipment Utilized During Treatment: Gait belt Activity Tolerance: Patient limited by fatigue;Patient limited by pain Patient left: in chair;with call bell/phone within reach;with chair alarm set;with family/visitor present Nurse Communication: Mobility status;Patient requests pain meds PT Visit Diagnosis: Unsteadiness on feet (R26.81);Pain;Muscle weakness (generalized) (M62.81);Other abnormalities of gait and mobility (R26.89) Pain - Right/Left: Left Pain - part of body: Hip     Time: 1610-9604 PT Time Calculation (min) (ACUTE ONLY): 31 min  Charges:  $Gait Training: 8-22 mins $Therapeutic Activity: 8-22 mins             Ramond Dial 01/23/2021, 4:35 PM  Mee Hives, PT MS Acute Rehab Dept. Number: [place] [facility] [place]

## 2021-01-23 NOTE — Evaluation (Signed)
Physical Therapy Evaluation Patient Details Name: Patty Howard MRN: [id number] DOB: 1955-12-28 Today's Date: 01/23/2021   History of Present Illness  65 yo female with onset of OA that has not responded to nonsurgical conservative measures has been admitted for THA L LE with direct anterior approach, WBAT.  PMHx:  OA with subchondral sclerosis and osteophytes L hip, atherosclerosis, asthma, COPD, L radial head fracture, chestpain, pancolitis, HTN, anxiety, sleep apnea  Clinical Impression  Pt was seen for AM session with progression of standing and gait as tolerated, then medicated by nursing and PT obtained ice pack.  Pt is noting pain as her biggest issue of mobility and so will focus on getting her comfortable before initiating movement.  Follow her along with gait and balance skills, moving L hip in tolerated ex and manage pain to keep her tolerance up.  Rehab is requested by pt to CIR, but will assume default to SNF if this is denied.    Follow Up Recommendations CIR    Equipment Recommendations  None recommended by PT    Recommendations for Other Services Rehab consult     Precautions / Restrictions Precautions Precautions: Fall Precaution Comments: premedicate Restrictions Weight Bearing Restrictions: Yes LLE Weight Bearing: Weight bearing as tolerated      Mobility  Bed Mobility               General bed mobility comments: up in chair when PT arrived    Transfers Overall transfer level: Needs assistance Equipment used: Rolling walker (2 wheeled);1 person hand held assist Transfers: Sit to/from Stand Sit to Stand: Mod assist         General transfer comment: mod to power up and control initial balance  Ambulation/Gait Ambulation/Gait assistance: Min assist;Mod assist Gait Distance (Feet): 6 Feet Assistive device: Rolling walker (2 wheeled);1 person hand held assist Gait Pattern/deviations: Step-through pattern Gait velocity: reduced Gait  velocity interpretation: <1.31 ft/sec, indicative of household ambulator General Gait Details: pt is reluctant to WB on LLE an is tending to shuffle and minimize pressure, pushing walker out too far and requires continual correction to manage  Stairs            Wheelchair Mobility    Modified Rankin (Stroke Patients Only)       Balance Overall balance assessment: Needs assistance Sitting-balance support: Feet supported Sitting balance-Leahy Scale: Fair     Standing balance support: Bilateral upper extremity supported;During functional activity Standing balance-Leahy Scale: Poor                               Pertinent Vitals/Pain Pain Assessment: 0-10 Pain Score: 9  Pain Location: L hip Pain Descriptors / Indicators: Operative site guarding;Grimacing Pain Intervention(s): Limited activity within patient's tolerance;Monitored during session;Repositioned;Premedicated before session;Ice applied    Home Living Family/patient expects to be discharged to:: Private residence Living Arrangements: Alone Available Help at Discharge: Family;Available PRN/intermittently Type of Home: House Home Access: Stairs to enter Entrance Stairs-Rails: Can reach both Entrance Stairs-Number of Steps: 16 Home Layout: One level Home Equipment: Walker - 2 wheels;Cane - single point Additional Comments: pt has been home alone but has intermittent help from family    Prior Function Level of Independence: Independent with assistive device(s)         Comments: using SPC mostly     Hand Dominance   Dominant Hand: Right    Extremity/Trunk Assessment   Upper Extremity Assessment Upper Extremity Assessment: Overall  WFL for tasks assessed    Lower Extremity Assessment Lower Extremity Assessment: LLE deficits/detail LLE Deficits / Details: L hip weak and painful with all movement LLE: Unable to fully assess due to pain LLE Coordination: decreased gross motor    Cervical  / Trunk Assessment Cervical / Trunk Assessment: Kyphotic (mild)  Communication   Communication: No difficulties  Cognition Arousal/Alertness: Awake/alert Behavior During Therapy: WFL for tasks assessed/performed Overall Cognitive Status: Within Functional Limits for tasks assessed                                 General Comments: following along with mobility instructions but physically limited      General Comments General comments (skin integrity, edema, etc.): pt is up to walk with help but cannot go far due to pain.  Medcation from nursing during session, since her premeds were not sufficient.  Pt was given ice pack to control her pain and edema on incision    Exercises     Assessment/Plan    PT Assessment Patient needs continued PT services  PT Problem List Decreased strength;Decreased activity tolerance;Decreased range of motion;Decreased mobility;Decreased balance;Decreased coordination;Decreased knowledge of use of DME;Decreased safety awareness;Decreased skin integrity;Pain       PT Treatment Interventions DME instruction;Gait training;Stair training;Functional mobility training;Therapeutic activities;Therapeutic exercise;Balance training;Neuromuscular re-education;Patient/family education    PT Goals (Current goals can be found in the Care Plan section)  Acute Rehab PT Goals Patient Stated Goal: to get to CIR and then home PT Goal Formulation: With patient Time For Goal Achievement: 02/06/21 Potential to Achieve Goals: Good    Frequency 7X/week   Barriers to discharge Inaccessible home environment;Decreased caregiver support home alone with two flights to enter    Co-evaluation               AM-PAC PT "6 Clicks" Mobility  Outcome Measure Help needed turning from your back to your side while in a flat bed without using bedrails?: A Little Help needed moving from lying on your back to sitting on the side of a flat bed without using bedrails?: A  Little Help needed moving to and from a bed to a chair (including a wheelchair)?: A Lot Help needed standing up from a chair using your arms (e.g., wheelchair or bedside chair)?: A Lot Help needed to walk in hospital room?: A Little Help needed climbing 3-5 steps with a railing? : Total 6 Click Score: 14    End of Session Equipment Utilized During Treatment: Gait belt Activity Tolerance: Patient limited by fatigue;Patient limited by pain Patient left: in chair;with call bell/phone within reach;with chair alarm set;with family/visitor present Nurse Communication: Mobility status;Patient requests pain meds PT Visit Diagnosis: Unsteadiness on feet (R26.81);Pain;Muscle weakness (generalized) (M62.81);Other abnormalities of gait and mobility (R26.89) Pain - Right/Left: Left Pain - part of body: Hip    Time: 5364-6803 PT Time Calculation (min) (ACUTE ONLY): 29 min   Charges:   PT Evaluation $PT Eval Moderate Complexity: 1 Mod PT Treatments $Gait Training: 8-22 mins       Ramond Dial 01/23/2021, 1:20 PM  Mee Hives, PT MS Acute Rehab Dept. Number: [place]

## 2021-01-23 NOTE — Progress Notes (Signed)
   Subjective: 1 Day Post-Op Procedure(s) (LRB): LEFT TOTAL HIP ARTHROPLASTY ANTERIOR APPROACH (Left) Patient reports pain as moderate and severe.    Objective: Vital signs in last 24 hours: Temp:  [97.6 F (36.4 C)-98.9 F (37.2 C)] 98.9 F (37.2 C) (08/11 0711) Pulse Rate:  [82-110] 105 (08/11 0711) Resp:  [15-19] 16 (08/11 0711) BP: (103-159)/(68-96) 147/77 (08/11 0711) SpO2:  [96 %-100 %] 97 % (08/11 0711) Weight:  [124.7 kg] 124.7 kg (08/10 1042)  Intake/Output from previous day: 08/10 0701 - 08/11 0700 In: 720.8 [I.V.:470.8; IV Piggyback:250] Out: 300 [Urine:50; Blood:250] Intake/Output this shift: No intake/output data recorded.  Recent Labs    01/23/21 0316  HGB 10.3*   Recent Labs    01/23/21 0316  WBC 12.2*  RBC 3.81*  HCT 32.4*  PLT 327   Recent Labs    01/23/21 0316  NA 136  K 2.9*  CL 96*  CO2 29  BUN 9  CREATININE 0.71  GLUCOSE 140*  CALCIUM 9.6   No results for input(s): LABPT, INR in the last 72 hours.  Neurologically intact DG C-Arm 1-60 Min  Result Date: 01/22/2021 CLINICAL DATA:  surgery EXAM: DG C-ARM 1-60 MIN; OPERATIVE LEFT HIP WITH PELVIS FLUOROSCOPY TIME:  Fluoroscopy Time:  14 seconds. Radiation Exposure Index (if provided by the fluoroscopic device): 3.38 mGy. Number of Acquired Spot Images: 5 COMPARISON:  12/29/2020. FINDINGS: Five C-arm fluoroscopic images were obtained intraoperatively and submitted for post operative interpretation. These images demonstrate left total hip arthroplasty. No unexpected findings. Normal alignment. Please see the performing provider's procedural report for further detail. IMPRESSION: Intraoperative fluoroscopy during left total hip arthroplasty. Electronically Signed   By: Margaretha Sheffield MD   On: 01/22/2021 14:29   DG Hip Port Unilat With Pelvis 1V Left  Result Date: 01/22/2021 CLINICAL DATA:  Left hip replacement EXAM: DG HIP (WITH OR WITHOUT PELVIS) 1V PORT LEFT COMPARISON:  None. FINDINGS:  Changes of left hip replacement. Normal AP alignment. No hardware bony complicating feature. IMPRESSION: Left hip replacement.  No visible complicating feature. Electronically Signed   By: Rolm Baptise M.D.   On: 01/22/2021 17:20   DG HIP OPERATIVE UNILAT WITH PELVIS LEFT  Result Date: 01/22/2021 CLINICAL DATA:  surgery EXAM: DG C-ARM 1-60 MIN; OPERATIVE LEFT HIP WITH PELVIS FLUOROSCOPY TIME:  Fluoroscopy Time:  14 seconds. Radiation Exposure Index (if provided by the fluoroscopic device): 3.38 mGy. Number of Acquired Spot Images: 5 COMPARISON:  12/29/2020. FINDINGS: Five C-arm fluoroscopic images were obtained intraoperatively and submitted for post operative interpretation. These images demonstrate left total hip arthroplasty. No unexpected findings. Normal alignment. Please see the performing provider's procedural report for further detail. IMPRESSION: Intraoperative fluoroscopy during left total hip arthroplasty. Electronically Signed   By: Margaretha Sheffield MD   On: 01/22/2021 14:29    Assessment/Plan: 1 Day Post-Op Procedure(s) (LRB): LEFT TOTAL HIP ARTHROPLASTY ANTERIOR APPROACH (Left) Up with therapy  Patty Howard 01/23/2021, 7:52 AM

## 2021-01-23 NOTE — Plan of Care (Signed)

## 2021-01-23 NOTE — Progress Notes (Signed)
RT NOTE:  Pt stated she is able to manage CPAP @ bedside. Pt understands RT is available is she need assistance.

## 2021-01-23 NOTE — Progress Notes (Signed)
Inpatient Rehab Admissions Coordinator Note:   Per PT recommendations, pt was screened for CIR candidacy by Shann Medal, PT, DPT.  At this time, a THA is not a qualifying diagnosis for CIR.  Would recommend TOC explore other rehab venues.  Please contact me with questions.   Shann Medal, PT, DPT [phone] 01/23/21 3:11 PM

## 2021-01-24 LAB — CBC
HCT: 35.2 % — ABNORMAL LOW (ref 36.0–46.0)
Hemoglobin: 11.2 g/dL — ABNORMAL LOW (ref 12.0–15.0)
MCH: 26.9 pg (ref 26.0–34.0)
MCHC: 31.8 g/dL (ref 30.0–36.0)
MCV: 84.4 fL (ref 80.0–100.0)
Platelets: 317 10*3/uL (ref 150–400)
RBC: 4.17 MIL/uL (ref 3.87–5.11)
RDW: 14.6 % (ref 11.5–15.5)
WBC: 12 10*3/uL — ABNORMAL HIGH (ref 4.0–10.5)
nRBC: 0 % (ref 0.0–0.2)

## 2021-01-24 MED ORDER — ASPIRIN 325 MG PO TBEC: 325.0000 mg | DELAYED_RELEASE_TABLET | Freq: Every day | ORAL | 0 refills | Status: DC

## 2021-01-24 MED ORDER — METHOCARBAMOL 500 MG PO TABS: 500.0000 mg | ORAL_TABLET | Freq: Three times a day (TID) | ORAL | 0 refills | Status: DC | PRN

## 2021-01-24 MED ORDER — HYDROCODONE-ACETAMINOPHEN 5-325 MG PO TABS: 1.0000 | ORAL_TABLET | Freq: Four times a day (QID) | ORAL | 0 refills | Status: DC | PRN

## 2021-01-24 MED ORDER — BISACODYL 10 MG RE SUPP
10.0000 mg | Freq: Once | RECTAL | Status: AC
  Administered 2021-01-24: 10 mg via RECTAL
  Filled 2021-01-24: qty 1

## 2021-01-24 MED ORDER — OXYCODONE-ACETAMINOPHEN 5-325 MG PO TABS: 1.0000 | ORAL_TABLET | ORAL | 0 refills | Status: DC | PRN

## 2021-01-24 NOTE — Plan of Care (Signed)

## 2021-01-24 NOTE — NC FL2 (Signed)
[place] LEVEL OF CARE SCREENING TOOL     IDENTIFICATION  Patient Name: Patty Howard Birthdate: June 29, 1955 Sex: female Admission Date (Current Location): 01/22/2021  [facility] and [place] Number:  Herbalist and Address:  [facility] [place] [address] [place], [place] [address]      Provider Number: [id number]  Attending Physician Name and Address:  [name], MD  Relative Name and Phone Number:  Elyse Jarvis [phone])   [phone]    Current Level of Care: Hospital Recommended Level of Care: [place] Prior Approval Number:    Date Approved/Denied:   PASRR Number: [id number]  Discharge Plan: SNF    Current Diagnoses: Patient Active Problem List   Diagnosis Date Noted   Arthritis of left hip 01/22/2021   Trochanteric bursitis, left hip 12/23/2020   Primary osteoarthritis of left hip 12/23/2020   Tracheobronchomalacia 95/28/4132   Diastolic dysfunction 44/06/270   Asthma exacerbation 11/13/2020   Menopause 11/08/2020   Influenza 10/10/2020   Medication management 04/30/2020   Gastroesophageal reflux disease 03/14/2020   OSA (obstructive sleep apnea) 02/15/2020   Abnormal findings on diagnostic imaging of lung 01/08/2020   Educated about COVID-19 virus infection 11/30/2019   Aortic atherosclerosis ([place]) 11/30/2019   Chest pain 11/26/2019   Acute asthma exacerbation 05/22/2019   Left radial head fracture 02/03/2019   Closed fracture of radial head 01/29/2019   Prediabetes 10/19/2018   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) ([place]) 10/19/2018   Insomnia 10/19/2018   Elbow pain, chronic, left 10/19/2018   Dyspnea 10/12/2017   Pancolitis ([place]) 05/20/2017   H/O Spinal surgery 01/18/2015   Essential hypertension 01/18/2015   Obesity 01/18/2015    Orientation RESPIRATION BLADDER Height & Weight     Time, Self, Place, Situation  Normal Continent Weight: 275 lb (124.7  kg) Height:  5' 10"  (177.8 cm)  BEHAVIORAL SYMPTOMS/MOOD NEUROLOGICAL BOWEL NUTRITION STATUS      Continent Diet (see d/c summary)  AMBULATORY STATUS COMMUNICATION OF NEEDS Skin   Limited Assist Verbally Surgical wounds                       Personal Care Assistance Level of Assistance  Feeding, Dressing   Feeding assistance: Independent Dressing Assistance: Limited assistance     Functional Limitations Info  Sight, Hearing, Speech Sight Info: Adequate Hearing Info: Adequate Speech Info: Adequate    SPECIAL CARE FACTORS FREQUENCY  PT (By licensed PT), OT (By licensed OT)     PT Frequency: 5x/ week OT Frequency: 5x/ week            Contractures Contractures Info: Not present    Additional Factors Info  Code Status, Allergies Code Status Info: Full Allergies Info: Bananas           Current Medications (01/24/2021):  This is the current hospital active medication list Current Facility-Administered Medications  Medication Dose Route Frequency Provider Last Rate Last Admin   0.9 %  sodium chloride infusion   Intravenous Continuous Lanae Crumbly, PA-C 85 mL/hr at 01/23/21 1129 New Bag at 01/23/21 1129   acetaminophen (TYLENOL) tablet 325-650 mg  325-650 mg Oral Q6H PRN Lanae Crumbly, PA-C       albuterol (PROVENTIL) (2.5 MG/3ML) 0.083% nebulizer solution 2.5 mg  2.5 mg Nebulization Q6H PRN Lanae Crumbly, PA-C   2.5 mg at 01/22/21 2244   amLODipine (NORVASC) tablet 5 mg  5 mg Oral Daily Ricard Dillon,  Alyson Locket, PA-C   5 mg at 01/24/21 9390   ascorbic acid (VITAMIN C) tablet 500 mg  500 mg Oral Daily Lanae Crumbly, PA-C   500 mg at 01/24/21 3009   aspirin EC tablet 325 mg  325 mg Oral Q breakfast Lanae Crumbly, PA-C   325 mg at 01/24/21 0850   atorvastatin (LIPITOR) tablet 10 mg  10 mg Oral Daily Lanae Crumbly, PA-C   10 mg at 01/24/21 2330   benzonatate (TESSALON) capsule 200 mg  200 mg Oral TID Lanae Crumbly, PA-C   200 mg at 01/24/21 0762   bisacodyl (DULCOLAX)  suppository 10 mg  10 mg Rectal Once [name], MD       docusate sodium (COLACE) capsule 100 mg  100 mg Oral BID Lanae Crumbly, PA-C   100 mg at 01/24/21 0850   fluticasone furoate-vilanterol (BREO ELLIPTA) 200-25 MCG/INH 1 puff  1 puff Inhalation Daily Einar Grad, RPH   1 puff at 01/24/21 0855   guaiFENesin-codeine 100-10 MG/5ML solution 5 mL  5 mL Oral Q4H PRN Lanae Crumbly, PA-C   5 mL at 01/24/21 0849   losartan (COZAAR) tablet 100 mg  100 mg Oral Daily Einar Grad, RPH   100 mg at 01/24/21 2633   And   hydrochlorothiazide (HYDRODIURIL) tablet 25 mg  25 mg Oral Daily Einar Grad, RPH   25 mg at 01/24/21 0850   HYDROcodone-acetaminophen (NORCO) 7.5-325 MG per tablet 1-2 tablet  1-2 tablet Oral Q4H PRN Lanae Crumbly, PA-C   2 tablet at 01/23/21 1532   ipratropium-albuterol (DUONEB) 0.5-2.5 (3) MG/3ML nebulizer solution 3 mL  3 mL Nebulization Q6H PRN Lanae Crumbly, PA-C       ipratropium-albuterol (DUONEB) 0.5-2.5 (3) MG/3ML nebulizer solution 3 mL  3 mL Nebulization Q6H PRN Rigoberto Noel, MD       levothyroxine (SYNTHROID) tablet 100 mcg  100 mcg Oral QAC breakfast Lanae Crumbly, PA-C   100 mcg at 01/24/21 3545   linaclotide (LINZESS) capsule 290 mcg  290 mcg Oral QAC breakfast Lanae Crumbly, PA-C   290 mcg at 01/24/21 6256   loratadine (CLARITIN) tablet 10 mg  10 mg Oral Daily Einar Grad, RPH   10 mg at 01/24/21 3893   menthol-cetylpyridinium (CEPACOL) lozenge 3 mg  1 lozenge Oral PRN Lanae Crumbly, PA-C       Or   phenol (CHLORASEPTIC) mouth spray 1 spray  1 spray Mouth/Throat PRN Lanae Crumbly, PA-C       metoCLOPramide (REGLAN) tablet 5-10 mg  5-10 mg Oral Q8H PRN Benjiman Core M, PA-C       Or   metoCLOPramide (REGLAN) injection 5-10 mg  5-10 mg Intravenous Q8H PRN Benjiman Core M, PA-C       montelukast (SINGULAIR) tablet 10 mg  10 mg Oral QHS Benjiman Core M, PA-C   10 mg at 01/23/21 2217   ondansetron (ZOFRAN) tablet 4 mg  4 mg Oral Q6H PRN  Lanae Crumbly, PA-C       Or   ondansetron [facility]) injection 4 mg  4 mg Intravenous Q6H PRN Lanae Crumbly, PA-C       oxyCODONE-acetaminophen (PERCOCET/ROXICET) 5-325 MG per tablet 1-2 tablet  1-2 tablet Oral Q4H PRN [name], MD   2 tablet at 01/24/21 0624   pantoprazole (PROTONIX) EC tablet 40 mg  40 mg Oral Daily PRN Lanae Crumbly,  PA-C       polyethylene glycol (MIRALAX / GLYCOLAX) packet 17 g  17 g Oral Daily PRN Benjiman Core M, PA-C       potassium chloride (KLOR-CON) packet 40 mEq  40 mEq Oral Daily [name], MD   40 mEq at 01/24/21 0850   sodium chloride HYPERTONIC 3 % nebulizer solution 4 mL  4 mL Nebulization PRN Lanae Crumbly, PA-C       umeclidinium bromide (INCRUSE ELLIPTA) 62.5 MCG/INH 1 puff  1 puff Inhalation Daily Einar Grad, RPH   1 puff at 01/24/21 0855   [START ON 01/27/2021] Vitamin D (Ergocalciferol) (DRISDOL) capsule 50,000 Units  50,000 Units Oral Q Mon Owens, James M, PA-C       zolpidem [facility]) tablet 5 mg  5 mg Oral QHS PRN Garald Balding, MD   5 mg at 01/23/21 2222     Discharge Medications: Please see discharge summary for a list of discharge medications.  Relevant Imaging Results:  Relevant Lab Results:   Additional Information SSn: [id number];  Moderna COVID-19 Vaccine 09/14/2019 , 08/19/2019  Moderna Covid-19 Booster Vaccine 05/03/2020  Milinda Antis, LCSWA

## 2021-01-24 NOTE — Discharge Instructions (Signed)

## 2021-01-24 NOTE — Progress Notes (Signed)
Dressing changed to left hip incision per MD's order. Pt tolerated with no problem.

## 2021-01-24 NOTE — TOC Initial Note (Signed)
Transition of Care [facility]) - Initial/Assessment Note    Patient Details  Name: Patty Howard MRN: [id number] Date of Birth: 08/07/1955  [facility] CM/SW Contact:    Milinda Antis, [place] Phone Number: 01/24/2021, 10:43 AM  Clinical Narrative:                 CSW received consult for possible SNF placement at time of discharge. CSW spoke with patient. Patient reported that the patient's nice is no longer available to assist at the patient's home after discharge. Patient expressed understanding of PT recommendation and is agreeable to SNF placement at time of discharge. Patient reports preference for a facility in the [place] area. CSW discussed insurance authorization process and provided Medicare SNF ratings list. Patient has received the COVID vaccines. Patient expressed being hopeful for rehab and to feel better soon. No further questions reported at this time.   Skilled Nursing Rehab Facilities-   [email or web address] *Ratings updated quarterly   Ratings out of 5 possible   Name Address  Phone # [place] Inspection Overall  [facility] [address], [place] 5  4 4   [facility]  [address] [place], [place] [phone] 4 2 4 4   [facility] [place], [place] 2 3 1 1   [facility] [place], [place] 3 3 4 4   [facility] [address], [place] 3  2 2   [place] 2 2 4 4   [facility] [address], [place] 5 2 2 3   [facility] [place] [address], [place] 4 2 2 2   [place] 5 2 2 3   [facility] [phone], [place] [phone] 5 1 2 2   [facility] [address] [facility] [phone] 5 2 2 3   [place] [phone] 3 1 1 1      Expected Discharge Plan: Skilled Nursing Facility Barriers to Discharge: Insurance Authorization, SNF Pending bed offer   Patient Goals and CMS Choice Patient states their goals for this hospitalization and ongoing recovery are:: To go back home after Rehab CMS Medicare.gov Compare Post Acute Care list provided to:: Patient Choice offered to / list presented to : Patient  Expected Discharge Plan and Services Expected Discharge Plan: [place]                                              Prior Living Arrangements/Services   Lives with:: Self Patient language and need for interpreter reviewed:: Yes          Care giver support system in place?: Yes (comment)   Criminal Activity/Legal Involvement Pertinent to Current Situation/Hospitalization: No - Comment as needed  Activities of Daily Living      Permission Sought/Granted   Permission granted to share information with : Yes, Release of Information Signed     Permission granted to share info w AGENCY: SNF        Emotional Assessment   Attitude/Demeanor/Rapport: Engaged Affect (typically observed): Accepting Orientation: : Oriented to Situation, Oriented to  Time, Oriented to Place, Oriented to Self Alcohol / Substance Use: Not Applicable Psych Involvement: No (comment)  Admission diagnosis:  Arthritis of left  hip [M16.12] Patient Active Problem List   Diagnosis Date Noted   Arthritis of left hip 01/22/2021   Trochanteric bursitis, left hip 12/23/2020   Primary osteoarthritis of left hip 12/23/2020   Tracheobronchomalacia 16/94/5038   Diastolic dysfunction 88/28/0034   Asthma exacerbation 11/13/2020   Menopause 11/08/2020   Influenza 10/10/2020   Medication management 04/30/2020   Gastroesophageal reflux disease 03/14/2020   OSA (obstructive sleep apnea) 02/15/2020   Abnormal findings on diagnostic imaging of lung  01/08/2020   Educated about COVID-19 virus infection 11/30/2019   Aortic atherosclerosis ([place]) 11/30/2019   Chest pain 11/26/2019   Acute asthma exacerbation 05/22/2019   Left radial head fracture 02/03/2019   Closed fracture of radial head 01/29/2019   Prediabetes 10/19/2018   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) ([place]) 10/19/2018   Insomnia 10/19/2018   Elbow pain, chronic, left 10/19/2018   Dyspnea 10/12/2017   Pancolitis ([place]) 05/20/2017   H/O Spinal surgery 01/18/2015   Essential hypertension 01/18/2015   Obesity 01/18/2015   PCP:  Buzzy Han, MD Pharmacy:   [id number] [place] [place]Phone: [phone]: [address] [address]Phone: [phone]: [address] [address] [address] [address]Phone: [phone]: [phone]    Social Determinants of Health (SDOH) Interventions    Readmission Risk Interventions No flowsheet data found.

## 2021-01-24 NOTE — Progress Notes (Addendum)
   Subjective: 2 Days Post-Op Procedure(s) (LRB): LEFT TOTAL HIP ARTHROPLASTY ANTERIOR APPROACH (Left) Patient reports pain as mild.  She said she is still having pain but fell asleep with fork in hand in the middle of breakfast. C/O abdominal pain. No flatus.   Objective: Vital signs in last 24 hours: Temp:  [98.2 F (36.8 C)-99.2 F (37.3 C)] 99.2 F (37.3 C) (08/12 0756) Pulse Rate:  [97-110] 103 (08/12 0756) Resp:  [17-18] 17 (08/12 0756) BP: (117-120)/(65-74) 117/65 (08/12 0756) SpO2:  [90 %-93 %] 92 % (08/12 0856)  Intake/Output from previous day: 08/11 0701 - 08/12 0700 In: 1270.1 [P.O.:480; I.V.:790.1] Out: -  Intake/Output this shift: No intake/output data recorded.  Recent Labs    01/23/21 0316 01/24/21 0251  HGB 10.3* 11.2*   Recent Labs    01/23/21 0316 01/24/21 0251  WBC 12.2* 12.0*  RBC 3.81* 4.17  HCT 32.4* 35.2*  PLT 327 317   Recent Labs    01/23/21 0316  NA 136  K 2.9*  CL 96*  CO2 29  BUN 9  CREATININE 0.71  GLUCOSE 140*  CALCIUM 9.6   No results for input(s): LABPT, INR in the last 72 hours.  Some blood on dressing .  No results found.  Assessment/Plan: 2 Days Post-Op Procedure(s) (LRB): LEFT TOTAL HIP ARTHROPLASTY ANTERIOR APPROACH (Left) Up with therapy, Needs to cut back on narcotic meds , take one instead of 2.  Dulcolax supp this AM.   Patient states her sister in [place] had to start some PT and now cannot come and stay with her. CIR not an option for THA, has not been an option due to medicare rules for over 20 yrs. Marland Kitchen    SNF will be plan unless she can find adult to stay with her.   [name] 01/24/2021, 9:41 AM

## 2021-01-24 NOTE — Progress Notes (Signed)
Physical Therapy Treatment Patient Details Name: Patty Howard MRN: [id number] DOB: Dec 23, 1955 Today's Date: 01/24/2021    History of Present Illness 65 yo female with onset of OA that has not responded to nonsurgical conservative measures has been admitted for THA L LE with direct anterior approach, WBAT.  PMHx:  OA with subchondral sclerosis and osteophytes L hip, atherosclerosis, asthma, COPD, L radial head fracture, chestpain, pancolitis, HTN, anxiety, sleep apnea    PT Comments    Patient received in bed, agreeable to PT session. Reports she slept well last night. She requires min assist with supine to sit with heavy use of bed rails. Mod assist to stand from elevated bed. Patient able to ambulate 12 feet with slow pace. Requires max cues for sequencing and to bring left foot forward. She will continue to benefit from skilled PT while here to improve functional independence and safety.     Follow Up Recommendations  SNF     Equipment Recommendations  Rolling walker with 5" wheels    Recommendations for Other Services       Precautions / Restrictions Precautions Precautions: Anterior Hip;Fall Restrictions Weight Bearing Restrictions: Yes LLE Weight Bearing: Weight bearing as tolerated    Mobility  Bed Mobility Overal bed mobility: Needs Assistance Bed Mobility: Supine to Sit     Supine to sit: Min assist     General bed mobility comments: L LE to bring off bed.    Transfers Overall transfer level: Needs assistance Equipment used: Rolling walker (2 wheeled) Transfers: Sit to/from Stand Sit to Stand: Mod assist;From elevated surface         General transfer comment: mod to power up from elevated surface, increased time  Ambulation/Gait Ambulation/Gait assistance: Min assist Gait Distance (Feet): 12 Feet Assistive device: Rolling walker (2 wheeled) Gait Pattern/deviations: Step-to pattern;Decreased step length - left;Shuffle;Decreased step length -  right Gait velocity: reduced   General Gait Details: Patient requires max cues for sequncing and safe use of walker. Unable to lift left LE, shuffles it forward on floor   Stairs             Wheelchair Mobility    Modified Rankin (Stroke Patients Only)       Balance Overall balance assessment: Needs assistance Sitting-balance support: Feet supported Sitting balance-Leahy Scale: Good     Standing balance support: Bilateral upper extremity supported;During functional activity Standing balance-Leahy Scale: Fair Standing balance comment: requires B UE support and min assist                            Cognition Arousal/Alertness: Awake/alert Behavior During Therapy: WFL for tasks assessed/performed Overall Cognitive Status: Within Functional Limits for tasks assessed                                        Exercises Total Joint Exercises Ankle Circles/Pumps: AROM;10 reps;Both Quad Sets: AROM;Left;5 reps Gluteal Sets: AROM;Left;5 reps Hip ABduction/ADduction: AAROM;Left;10 reps Long Arc Quad: AROM;10 reps;Both    General Comments        Pertinent Vitals/Pain Pain Assessment: 0-10 Pain Score: 7  Pain Location: L hip Pain Descriptors / Indicators: Aching;Grimacing;Guarding;Discomfort Pain Intervention(s): Limited activity within patient's tolerance;Monitored during session;Repositioned    Home Living                      Prior Function  PT Goals (current goals can now be found in the care plan section) Acute Rehab PT Goals Patient Stated Goal: to go to rehab PT Goal Formulation: With patient Time For Goal Achievement: 02/06/21 Potential to Achieve Goals: Good Progress towards PT goals: Progressing toward goals    Frequency    7X/week      PT Plan Current plan remains appropriate    Co-evaluation              AM-PAC PT "6 Clicks" Mobility   Outcome Measure  Help needed turning from your  back to your side while in a flat bed without using bedrails?: A Lot Help needed moving from lying on your back to sitting on the side of a flat bed without using bedrails?: A Lot Help needed moving to and from a bed to a chair (including a wheelchair)?: A Lot Help needed standing up from a chair using your arms (e.g., wheelchair or bedside chair)?: A Lot Help needed to walk in hospital room?: A Little Help needed climbing 3-5 steps with a railing? : Total 6 Click Score: 12    End of Session Equipment Utilized During Treatment: Gait belt Activity Tolerance: Patient limited by fatigue;Patient limited by pain Patient left: in chair;with call bell/phone within reach Nurse Communication: Mobility status PT Visit Diagnosis: Unsteadiness on feet (R26.81);Pain;Muscle weakness (generalized) (M62.81);Other abnormalities of gait and mobility (R26.89);Difficulty in walking, not elsewhere classified (R26.2) Pain - Right/Left: Left Pain - part of body: Hip     Time: 4037-5436 PT Time Calculation (min) (ACUTE ONLY): 30 min  Charges:  $Gait Training: 8-22 mins $Therapeutic Exercise: 8-22 mins                     Siria Calandro, PT, GCS 01/24/21,11:35 AM

## 2021-01-24 NOTE — Progress Notes (Signed)
Physical Therapy Treatment Patient Details Name: Patty Howard MRN: [id number] DOB: 28-Jun-1955 Today's Date: 01/24/2021    History of Present Illness 65 yo female with onset of OA that has not responded to nonsurgical conservative measures has been admitted for THA L LE with direct anterior approach, WBAT.  PMHx:  OA with subchondral sclerosis and osteophytes L hip, atherosclerosis, asthma, COPD, L radial head fracture, chestpain, pancolitis, HTN, anxiety, sleep apnea    PT Comments    Patient received sitting up in bed. Agreeable to PT session. She reports L LE swollen. Provided patient with ice pack at end of session. She requires min assist for bed mobility, transfers with mod assist and requires min guard for ambulation, however max cues for sequencing. Patient will continue to benefit from skilled PT while here to improve independence, mobility and strength.         Follow Up Recommendations  SNF     Equipment Recommendations  Rolling walker with 5" wheels    Recommendations for Other Services       Precautions / Restrictions Precautions Precautions: Anterior Hip;Fall Restrictions Weight Bearing Restrictions: Yes LLE Weight Bearing: Weight bearing as tolerated    Mobility  Bed Mobility Overal bed mobility: Needs Assistance Bed Mobility: Sit to Supine     Supine to sit: Min assist Sit to supine: Min assist   General bed mobility comments: Assist to bring L LE up onto bed.    Transfers Overall transfer level: Needs assistance Equipment used: Rolling walker (2 wheeled) Transfers: Sit to/from Stand Sit to Stand: Mod assist;From elevated surface         General transfer comment: mod assist from elevated bed. Cues for hand placement, weight bearing, technique  Ambulation/Gait Ambulation/Gait assistance: Min guard Gait Distance (Feet): 22 Feet Assistive device: Rolling walker (2 wheeled) Gait Pattern/deviations: Step-to pattern;Decreased step length -  right;Decreased step length - left;Decreased weight shift to left;Trunk flexed Gait velocity: reduced, but improved from am session.   General Gait Details: Max cues for sequencing, did not recall from morning session. Continues to be unable to lift L LE or take a step. Shuffles left foot up. Small steps.   Stairs             Wheelchair Mobility    Modified Rankin (Stroke Patients Only)       Balance Overall balance assessment: Needs assistance Sitting-balance support: Feet supported Sitting balance-Leahy Scale: Good     Standing balance support: Bilateral upper extremity supported;During functional activity Standing balance-Leahy Scale: Fair Standing balance comment: requires B UE support and min assist                            Cognition Arousal/Alertness: Awake/alert Behavior During Therapy: WFL for tasks assessed/performed Overall Cognitive Status: Within Functional Limits for tasks assessed                                 General Comments: following along with mobility instructions but physically limited, limited recall of exercises from this morning.      Exercises Total Joint Exercises Ankle Circles/Pumps: AROM;Both;10 reps Quad Sets: AROM;Left;5 reps Gluteal Sets: AROM;Left;5 reps Heel Slides: AAROM;Left;10 reps Hip ABduction/ADduction: AAROM;Left;10 reps Long Arc Quad: AROM;Left;10 reps    General Comments        Pertinent Vitals/Pain Pain Assessment: Faces Pain Score: 7  Faces Pain Scale: Hurts a little bit Pain  Location: L hip Pain Descriptors / Indicators: Aching;Grimacing;Guarding;Discomfort Pain Intervention(s): Monitored during session;Limited activity within patient's tolerance;Repositioned;Ice applied    Home Living                      Prior Function            PT Goals (current goals can now be found in the care plan section) Acute Rehab PT Goals Patient Stated Goal: to get back home PT Goal  Formulation: With patient Time For Goal Achievement: 02/06/21 Potential to Achieve Goals: Good Progress towards PT goals: Progressing toward goals    Frequency    7X/week      PT Plan Current plan remains appropriate    Co-evaluation              AM-PAC PT "6 Clicks" Mobility   Outcome Measure  Help needed turning from your back to your side while in a flat bed without using bedrails?: A Little Help needed moving from lying on your back to sitting on the side of a flat bed without using bedrails?: A Lot Help needed moving to and from a bed to a chair (including a wheelchair)?: A Lot Help needed standing up from a chair using your arms (e.g., wheelchair or bedside chair)?: A Lot Help needed to walk in hospital room?: A Little Help needed climbing 3-5 steps with a railing? : Total 6 Click Score: 13    End of Session Equipment Utilized During Treatment: Gait belt Activity Tolerance: Patient limited by fatigue Patient left: in bed;with call bell/phone within reach Nurse Communication: Mobility status PT Visit Diagnosis: Unsteadiness on feet (R26.81);Pain;Muscle weakness (generalized) (M62.81);Other abnormalities of gait and mobility (R26.89);Difficulty in walking, not elsewhere classified (R26.2) Pain - Right/Left: Left Pain - part of body: Hand     Time: 1413-1436 PT Time Calculation (min) (ACUTE ONLY): 23 min  Charges:  $Gait Training: 8-22 mins $Therapeutic Exercise: 8-22 mins                    [employer], PT, GCS 01/24/21,2:54 PM

## 2021-01-24 NOTE — Care Management Important Message (Signed)
Important Message  Patient Details  Name: Patty Howard MRN: [id number] Date of Birth: May 27, 1956   Medicare Important Message Given:  Yes     [name] Montine Circle 01/24/2021, 4:59 PM

## 2021-01-24 NOTE — Discharge Summary (Addendum)
Patient ID: Van Ehlert MRN: [id number] DOB/AGE: 01/12/1956 65 y.o.  Admit date: 01/22/2021 Discharge date: 01/30/21  Admission Diagnoses:  Active Problems:   Primary osteoarthritis of left hip   Arthritis of left hip   Discharge Diagnoses:  Active Problems:   Primary osteoarthritis of left hip   Arthritis of left hip  status post Procedure(s): LEFT TOTAL HIP ARTHROPLASTY ANTERIOR APPROACH  Past Medical History:  Diagnosis Date   Anxiety    Arthritis    Asthma    COPD (chronic obstructive pulmonary disease) ([place])    Fracture    closed displaced left radial head   GERD (gastroesophageal reflux disease)    Hypertension    Sleep apnea    does not wear CPAP    Surgeries: Procedure(s): LEFT TOTAL HIP ARTHROPLASTY ANTERIOR APPROACH on 01/22/2021   Consultants:   Discharged Condition: Improved  Hospital Course: Celestine Prim is an 65 y.o. female who was admitted 01/22/2021 for operative treatment of left hip djd. Patient failed conservative treatments (please see the history and physical for the specifics) and had severe unremitting pain that affects sleep, daily activities and work/hobbies. After pre-op clearance, the patient was taken to the operating room on 01/22/2021 and underwent  Procedure(s): LEFT TOTAL HIP ARTHROPLASTY ANTERIOR APPROACH.    Patient was given perioperative antibiotics:  Anti-infectives (From admission, onward)    Start     Dose/Rate Route Frequency Ordered Stop   01/22/21 1045  ceFAZolin (ANCEF) IVPB 3g/100 mL premix        3 g 200 mL/hr over 30 Minutes Intravenous On call to O.R. 01/22/21 1033 01/22/21 1245        Patient was given sequential compression devices and early ambulation to prevent DVT.   Patient benefited maximally from hospital stay and there were no complications. At the time of discharge, the patient was urinating/moving their bowels without difficulty, tolerating a regular diet, pain is controlled with oral  pain medications and they have been cleared by PT/OT.   Recent vital signs: Patient Vitals for the past 24 hrs:  BP Temp Temp src Pulse Resp SpO2  01/24/21 1256 120/73 99.3 F (37.4 C) Oral (!) 108 17 95 %  01/24/21 0856 -- -- -- -- -- 92 %  01/24/21 0756 117/65 99.2 F (37.3 C) Oral (!) 103 17 90 %  01/23/21 2216 120/74 98.2 F (36.8 C) Oral (!) 110 18 93 %  01/23/21 1533 117/72 98.5 F (36.9 C) Oral 97 17 93 %     Recent laboratory studies:  Recent Labs    01/23/21 0316 01/24/21 0251  WBC 12.2* 12.0*  HGB 10.3* 11.2*  HCT 32.4* 35.2*  PLT 327 317  NA 136  --   K 2.9*  --   CL 96*  --   CO2 29  --   BUN 9  --   CREATININE 0.71  --   GLUCOSE 140*  --   CALCIUM 9.6  --      Discharge Medications:   Allergies as of 01/24/2021       Reactions   Banana Hives, Itching        Medication List     STOP taking these medications    zolpidem 10 MG tablet Commonly known as: AMBIEN       TAKE these medications    albuterol (2.5 MG/3ML) 0.083% nebulizer solution Commonly known as: PROVENTIL Take 2.5 mg by nebulization every 6 (six) hours as needed for wheezing or shortness of breath.  albuterol 108 (90 Base) MCG/ACT inhaler Commonly known as: VENTOLIN HFA Inhale 1-2 puffs into the lungs every 6 (six) hours as needed for wheezing or shortness of breath.   amLODipine 5 MG tablet Commonly known as: NORVASC TAKE 1 TABLET (5 MG TOTAL) BY MOUTH DAILY.   ascorbic acid 500 MG tablet Commonly known as: VITAMIN C Take 500 mg by mouth daily.   aspirin 325 MG EC tablet Take 1 tablet (325 mg total) by mouth daily with breakfast. Must take at least 4 weeks postop for DVT prophylaxis Start taking on: January 25, 2021   atorvastatin 10 MG tablet Commonly known as: LIPITOR Take 10 mg by mouth daily.   benzonatate 200 MG capsule Commonly known as: TESSALON Take 1 capsule (200 mg total) by mouth 3 (three) times daily.   Diethylpropion HCl CR 75 MG Tb24 Take 75 mg  by mouth every morning.   furosemide 40 MG tablet Commonly known as: LASIX Take 1 tablet (40 mg total) by mouth daily as needed for fluid or edema (WEIGHT GAIN OF 2LBS, SHORTNESS OF BREATH). What changed: when to take this   guaiFENesin-codeine 100-10 MG/5ML syrup Take 5 mLs by mouth every 4 (four) hours as needed for cough.   HYDROcodone-acetaminophen 5-325 MG tablet Commonly known as: Norco Take 1-2 tablets by mouth every 6 (six) hours as needed for moderate pain. What changed:  how much to take Another medication with the same name was removed. Continue taking this medication, and follow the directions you see here.   ipratropium-albuterol 0.5-2.5 (3) MG/3ML Soln Commonly known as: DUONEB Take 3 mLs by nebulization every 6 (six) hours as needed. What changed: reasons to take this   levocetirizine 5 MG tablet Commonly known as: XYZAL Take 1 tablet (5 mg total) by mouth every evening.   levothyroxine 100 MCG tablet Commonly known as: SYNTHROID Take 100 mcg by mouth daily before breakfast.   linaclotide 290 MCG Caps capsule Commonly known as: LINZESS Take 290 mcg by mouth daily before breakfast.   losartan-hydrochlorothiazide 100-25 MG tablet Commonly known as: HYZAAR Take 1 tablet by mouth daily.   methocarbamol 500 MG tablet Commonly known as: Robaxin Take 1 tablet (500 mg total) by mouth every 8 (eight) hours as needed for muscle spasms.   montelukast 10 MG tablet Commonly known as: SINGULAIR Take 1 tablet (10 mg total) by mouth at bedtime.   multivitamin with minerals Tabs tablet Take 1 tablet by mouth daily. Centrum Multivitamin   pantoprazole 40 MG tablet Commonly known as: PROTONIX TAKE 1 TABLET BY MOUTH EVERY DAY What changed:  when to take this reasons to take this   sodium chloride HYPERTONIC 3 % nebulizer solution Take by nebulization as needed for other or cough. Twice a day as needed for chest congestion What changed:  how much to  take additional instructions   Trelegy Ellipta 200-62.5-25 MCG/INH Aepb Generic drug: Fluticasone-Umeclidin-Vilant Inhale 1 puff into the lungs daily.   Vitamin D (Ergocalciferol) 1.25 MG (50000 UNIT) Caps capsule Commonly known as: DRISDOL Take 50,000 Units by mouth every Monday.        Diagnostic Studies: DG Chest 2 View  Result Date: 01/17/2021 CLINICAL DATA:  Pre-admission for left knee arthroplasty. EXAM: CHEST - 2 VIEW COMPARISON:  11/11/2020. FINDINGS: The heart size and mediastinal contours are within normal limits. Both lungs are clear. The visualized skeletal structures are unremarkable. IMPRESSION: No active cardiopulmonary disease. Electronically Signed   By: Kerby Moors M.D.   On: 01/17/2021 11:07  MR Hip Left w/o contrast  Result Date: 12/30/2020 CLINICAL DATA:  Chronic left hip pain.  No known injury. EXAM: MR OF THE LEFT HIP WITHOUT CONTRAST TECHNIQUE: Multiplanar, multisequence MR imaging was performed. No intravenous contrast was administered. COMPARISON:  Plain films left hip 12/09/2020. FINDINGS: Bones: There is subchondral edema about the left hip. No fracture, stress change or worrisome lesion is identified. No avascular necrosis of the femoral heads. Minimal subchondral edema is seen about the right hip. Articular cartilage and labrum Articular cartilage: Bone-on-bone joint space narrowing is present left. Labrum: Evaluation is limited but the left superior and anterior labrum appears severely degenerated. Joint or bursal effusion Joint effusion:  Small to moderate left hip effusion. Bursae: There is small volume of fluid in the trochanteric bursa bilaterally. Muscles and tendons Muscles and tendons: The left gluteus medius and minimus appear torn. There is severe left gluteus minimus and moderate to moderately severe left gluteus medius fatty atrophy. Evaluation of the tendons is limited due to the patient's habitus. Dedicated imaging of the right hip is not provided  but the right gluteus minimus appears completely torn with associated moderately severe fatty atrophy. Other findings Miscellaneous:   None. IMPRESSION: Severe left hip osteoarthritis with bone-on-bone joint space narrowing and subchondral edema about the joint. The left labrum is not well seen but the superior and anterior labrum appears severely degenerated and torn. The left gluteus medius and minimus appear completely torn with severe atrophy of the gluteus minimus and moderate to moderately severe fatty atrophy of the gluteus medius. Dedicated imaging of the right hip is not performed but the right gluteus minimus appears completely torn with associated moderate to moderately severe fatty atrophy. Electronically Signed   By: Inge Rise M.D.   On: 12/30/2020 20:17   DG C-Arm 1-60 Min  Result Date: 01/22/2021 CLINICAL DATA:  surgery EXAM: DG C-ARM 1-60 MIN; OPERATIVE LEFT HIP WITH PELVIS FLUOROSCOPY TIME:  Fluoroscopy Time:  14 seconds. Radiation Exposure Index (if provided by the fluoroscopic device): 3.38 mGy. Number of Acquired Spot Images: 5 COMPARISON:  12/29/2020. FINDINGS: Five C-arm fluoroscopic images were obtained intraoperatively and submitted for post operative interpretation. These images demonstrate left total hip arthroplasty. No unexpected findings. Normal alignment. Please see the performing provider's procedural report for further detail. IMPRESSION: Intraoperative fluoroscopy during left total hip arthroplasty. Electronically Signed   By: Margaretha Sheffield MD   On: 01/22/2021 14:29   DG Hip Port Unilat With Pelvis 1V Left  Result Date: 01/22/2021 CLINICAL DATA:  Left hip replacement EXAM: DG HIP (WITH OR WITHOUT PELVIS) 1V PORT LEFT COMPARISON:  None. FINDINGS: Changes of left hip replacement. Normal AP alignment. No hardware bony complicating feature. IMPRESSION: Left hip replacement.  No visible complicating feature. Electronically Signed   By: Rolm Baptise M.D.   On: 01/22/2021  17:20   DG HIP OPERATIVE UNILAT WITH PELVIS LEFT  Result Date: 01/22/2021 CLINICAL DATA:  surgery EXAM: DG C-ARM 1-60 MIN; OPERATIVE LEFT HIP WITH PELVIS FLUOROSCOPY TIME:  Fluoroscopy Time:  14 seconds. Radiation Exposure Index (if provided by the fluoroscopic device): 3.38 mGy. Number of Acquired Spot Images: 5 COMPARISON:  12/29/2020. FINDINGS: Five C-arm fluoroscopic images were obtained intraoperatively and submitted for post operative interpretation. These images demonstrate left total hip arthroplasty. No unexpected findings. Normal alignment. Please see the performing provider's procedural report for further detail. IMPRESSION: Intraoperative fluoroscopy during left total hip arthroplasty. Electronically Signed   By: Margaretha Sheffield MD   On: 01/22/2021 14:29  Follow-up Information     [name], MD Follow up in 2 week(s).   Specialty: Orthopedic Surgery Contact information: [address] [place] [address] [phone]                 Discharge Plan:  discharge to snf  Disposition: stable at discharge.     Signed: Benjiman Core for Dr Rodell Perna 01/24/2021, 3:17 PM   Patient continues to make progress with physical therapy and was independently ambulatory with a walker to the bathroom.  No when available for her at discharge she lives alone.  Sister lives out of state unable to be with patient.  She will be discharged to skilled facility.  Office follow-up in our office in about 1 week (about 2 weeks postop).  She was stable at discharge.  She is on 1 aspirin a day and ambulation for DVT prophylaxis   Addendum added 01/30/2021.  Patient continued to do therapy walk with her walker on a daily basis.  She had a bowel movement pain was controlled and she was ready for transfer to the SNF on 01/30/2021.

## 2021-01-25 LAB — BASIC METABOLIC PANEL
Anion gap: 9 (ref 5–15)
BUN: 11 mg/dL (ref 8–23)
CO2: 28 mmol/L (ref 22–32)
Calcium: 9.2 mg/dL (ref 8.9–10.3)
Chloride: 99 mmol/L (ref 98–111)
Creatinine, Ser: 0.75 mg/dL (ref 0.44–1.00)
GFR, Estimated: >60 mL/min (ref >60)
Glucose, Bld: 112 mg/dL — ABNORMAL HIGH (ref 70–99)
Potassium: 3 mmol/L — ABNORMAL LOW (ref 3.5–5.1)
Sodium: 136 mmol/L (ref 135–145)

## 2021-01-25 NOTE — Progress Notes (Signed)
Pt placed self on cpap . Will monotor

## 2021-01-25 NOTE — Progress Notes (Signed)
  Subjective: Patient stable.  Ambulating.  Pain controlled.   Objective: Vital signs in last 24 hours: Temp:  [98.6 F (37 C)-99.3 F (37.4 C)] 98.6 F (37 C) (08/12 2147) Pulse Rate:  [103-108] 104 (08/12 2147) Resp:  [17] 17 (08/12 1256) BP: (110-120)/(63-73) 110/63 (08/12 2147) SpO2:  [90 %-96 %] 96 % (08/12 2147)  Intake/Output from previous day: 08/12 0701 - 08/13 0700 In: 480 [P.O.:480] Out: -  Intake/Output this shift: No intake/output data recorded.  Exam:  Neurovascular intact Sensation intact distally Intact pulses distally  Labs: Recent Labs    01/23/21 0316 01/24/21 0251  HGB 10.3* 11.2*   Recent Labs    01/23/21 0316 01/24/21 0251  WBC 12.2* 12.0*  RBC 3.81* 4.17  HCT 32.4* 35.2*  PLT 327 317   Recent Labs    01/23/21 0316  NA 136  K 2.9*  CL 96*  CO2 29  BUN 9  CREATININE 0.71  GLUCOSE 140*  CALCIUM 9.6   No results for input(s): LABPT, INR in the last 72 hours.  Assessment/Plan: Plan at this time is continue to work with physical therapy.  Anticipate discharge tomorrow versus Monday.  Overall the patient appears comfortable and is making progress with therapy.  Check be met today for potassium increase following supplementation   G Alphonzo Severance 01/25/2021, 7:36 AM

## 2021-01-25 NOTE — Progress Notes (Signed)
Physical Therapy Treatment Patient Details Name: Kerby Hockley MRN: [id number] DOB: 04-24-1956 Today's Date: 01/25/2021    History of Present Illness 65 yo female with onset of OA that has not responded to nonsurgical conservative measures has been admitted for THA L LE with direct anterior approach, WBAT.  PMHx:  OA with subchondral sclerosis and osteophytes L hip, atherosclerosis, asthma, COPD, L radial head fracture, chestpain, pancolitis, HTN, anxiety, sleep apnea    PT Comments    Pt seated in recliner and eager to move back to bed.  Pt moving slow but able to pick L foot up as gt progressed. She continues to benefit from rehab in a post acute setting.  Will continues to follow per POC.      Follow Up Recommendations  SNF     Equipment Recommendations  Rolling walker with 5" wheels    Recommendations for Other Services Rehab consult     Precautions / Restrictions Precautions Precautions: Anterior Hip;Fall Precaution Comments: premedicate Restrictions Weight Bearing Restrictions: Yes LLE Weight Bearing: Weight bearing as tolerated    Mobility  Bed Mobility Overal bed mobility: Needs Assistance Bed Mobility: Sit to Supine       Sit to supine: Supervision   General bed mobility comments: With use of gt belt to elevate leg into bed.    Transfers Overall transfer level: Needs assistance Equipment used: Rolling walker (2 wheeled) Transfers: Sit to/from Stand Sit to Stand: From elevated surface;Min assist         General transfer comment: Cues for hand placement to and from seated surface.  Pt attempting to pull on RW to rise into standing.  Ambulation/Gait Ambulation/Gait assistance: Min guard Gait Distance (Feet): 40 Feet (+ 10 ft) Assistive device: Rolling walker (2 wheeled) Gait Pattern/deviations: Step-to pattern;Decreased step length - right;Decreased step length - left;Decreased weight shift to left;Trunk flexed     General Gait Details:  Continues to require max cues for sequencing.  Able to lift L foot and gt progressed however she did start with shuffles on L foot.   Stairs             Wheelchair Mobility    Modified Rankin (Stroke Patients Only)       Balance     Sitting balance-Leahy Scale: Good       Standing balance-Leahy Scale: Fair                              Cognition Arousal/Alertness: Awake/alert Behavior During Therapy: WFL for tasks assessed/performed Overall Cognitive Status: Within Functional Limits for tasks assessed                                        Exercises General Exercises - Lower Extremity Ankle Circles/Pumps: AROM;Both;10 reps;Supine Quad Sets: AROM;Left;10 reps;Supine Heel Slides: AAROM;Left;10 reps;Supine Hip ABduction/ADduction: AAROM;Left;10 reps;Supine    General Comments        Pertinent Vitals/Pain Pain Assessment: Faces Pain Score: 7  Pain Location: L hip Pain Descriptors / Indicators: Aching;Grimacing;Guarding;Discomfort Pain Intervention(s): Monitored during session;Repositioned    Home Living                      Prior Function            PT Goals (current goals can now be found in the care plan section) Acute Rehab PT  Goals Patient Stated Goal: to get back home Potential to Achieve Goals: Good Progress towards PT goals: Progressing toward goals    Frequency           PT Plan Current plan remains appropriate    Co-evaluation              AM-PAC PT "6 Clicks" Mobility   Outcome Measure  Help needed turning from your back to your side while in a flat bed without using bedrails?: A Little Help needed moving from lying on your back to sitting on the side of a flat bed without using bedrails?: A Lot Help needed moving to and from a bed to a chair (including a wheelchair)?: A Lot Help needed standing up from a chair using your arms (e.g., wheelchair or bedside chair)?: A Lot Help needed to  walk in hospital room?: A Little Help needed climbing 3-5 steps with a railing? : A Little 6 Click Score: 15    End of Session Equipment Utilized During Treatment: Gait belt Activity Tolerance: Patient limited by fatigue Patient left: in bed;with call bell/phone within reach Nurse Communication: Mobility status PT Visit Diagnosis: Unsteadiness on feet (R26.81);Pain;Muscle weakness (generalized) (M62.81);Other abnormalities of gait and mobility (R26.89);Difficulty in walking, not elsewhere classified (R26.2) Pain - Right/Left: Left Pain - part of body: Hand     Time: 9774-1423 PT Time Calculation (min) (ACUTE ONLY): 29 min  Charges:  $Gait Training: 8-22 mins $Therapeutic Exercise: 8-22 mins                     Erasmo Leventhal , PTA Acute Rehabilitation Services Pager [phone] Office [phone]    Porcia Morganti Eli Hose 01/25/2021, 2:14 PM

## 2021-01-25 NOTE — Plan of Care (Signed)
  Problem: Education: Goal: Knowledge of General Education information will improve Description: Including pain rating scale, medication(s)/side effects and non-pharmacologic comfort measures Outcome: Progressing   Problem: Clinical Measurements: Goal: Will remain free from infection Outcome: Progressing   Problem: Activity: Goal: Risk for activity intolerance will decrease Outcome: Progressing   

## 2021-01-26 NOTE — Progress Notes (Signed)
Physical Therapy Treatment Patient Details Name: Patty Howard MRN: [id number] DOB: 01-12-1956 Today's Date: 01/26/2021    History of Present Illness 65 yo female with onset of OA that has not responded to nonsurgical conservative measures has been admitted for THA L LE with direct anterior approach, WBAT.  PMHx:  OA with subchondral sclerosis and osteophytes L hip, atherosclerosis, asthma, COPD, L radial head fracture, chestpain, pancolitis, HTN, anxiety, sleep apnea    PT Comments    Pt supine in bed on arrival this session.  Pt eager to mobilize.  She continues to present with poor endurance and decreased strength and remains appropriate for post acute rehab.  Pt is making slow progress.    Follow Up Recommendations  SNF     Equipment Recommendations  Rolling walker with 5" wheels    Recommendations for Other Services       Precautions / Restrictions Precautions Precautions: Anterior Hip;Fall Precaution Comments: premedicate Restrictions Weight Bearing Restrictions: Yes LLE Weight Bearing: Weight bearing as tolerated    Mobility  Bed Mobility Overal bed mobility: Needs Assistance Bed Mobility: Supine to Sit     Supine to sit: Supervision     General bed mobility comments: Increased time and effort but did not need gt belt to advance surgical leg.    Transfers Overall transfer level: Needs assistance Equipment used: Rolling walker (2 wheeled) Transfers: Sit to/from Stand Sit to Stand: Min guard         General transfer comment: Cues for hand placement to and from seated surface.  Pt attempting to pull on RW to rise into standing.  Ambulation/Gait Ambulation/Gait assistance: Min guard Gait Distance (Feet): 65 Feet Assistive device: Rolling walker (2 wheeled) Gait Pattern/deviations: Step-to pattern;Decreased step length - right;Decreased step length - left;Decreased weight shift to left;Trunk flexed     General Gait Details: Pt with better carryover  with L foot clearance.  Pt does fatigue with increased gt distance and required x 3 standing rest periods.  Cues to step into stability of the RW.   Stairs             Wheelchair Mobility    Modified Rankin (Stroke Patients Only)       Balance Overall balance assessment: Needs assistance Sitting-balance support: Feet supported Sitting balance-Leahy Scale: Good       Standing balance-Leahy Scale: Fair                              Cognition Arousal/Alertness: Awake/alert Behavior During Therapy: WFL for tasks assessed/performed Overall Cognitive Status: Within Functional Limits for tasks assessed                                        Exercises General Exercises - Lower Extremity Ankle Circles/Pumps: AROM;Both;10 reps;Supine Quad Sets: AROM;Left;10 reps;Supine Long Arc Quad: AROM;Left;10 reps;Seated Heel Slides: AAROM;Left;10 reps;Supine Hip ABduction/ADduction: AAROM;Left;10 reps;Supine    General Comments        Pertinent Vitals/Pain Pain Assessment: Faces Faces Pain Scale: Hurts even more Pain Location: L hip Pain Descriptors / Indicators: Aching;Grimacing;Guarding;Discomfort Pain Intervention(s): Monitored during session;Repositioned    Home Living                      Prior Function            PT Goals (current goals can now  be found in the care plan section) Acute Rehab PT Goals Patient Stated Goal: to get back home Potential to Achieve Goals: Good Progress towards PT goals: Progressing toward goals    Frequency           PT Plan Current plan remains appropriate    Co-evaluation              AM-PAC PT "6 Clicks" Mobility   Outcome Measure  Help needed turning from your back to your side while in a flat bed without using bedrails?: A Little Help needed moving from lying on your back to sitting on the side of a flat bed without using bedrails?: A Lot Help needed moving to and from a bed to  a chair (including a wheelchair)?: A Lot Help needed standing up from a chair using your arms (e.g., wheelchair or bedside chair)?: A Lot Help needed to walk in hospital room?: A Little Help needed climbing 3-5 steps with a railing? : A Little 6 Click Score: 15    End of Session Equipment Utilized During Treatment: Gait belt Activity Tolerance: Patient limited by fatigue Patient left: in bed;with call bell/phone within reach Nurse Communication: Mobility status PT Visit Diagnosis: Unsteadiness on feet (R26.81);Pain;Muscle weakness (generalized) (M62.81);Other abnormalities of gait and mobility (R26.89);Difficulty in walking, not elsewhere classified (R26.2) Pain - Right/Left: Left Pain - part of body: Leg     Time: 1115-1141 PT Time Calculation (min) (ACUTE ONLY): 26 min  Charges:  $Gait Training: 8-22 mins $Therapeutic Exercise: 8-22 mins                     Erasmo Leventhal , PTA Acute Rehabilitation Services Pager [phone] Office [phone]    Patty Howard 01/26/2021, 2:14 PM

## 2021-01-26 NOTE — Progress Notes (Signed)
Patient stable. Somewhat hypotensive and tachycardic at 109 with last vitals but she feels well without complaint of CP, SOB, calf pain.  Incision looks appropriate without much drainage. Had a small amount of bloody drainage and so it was replaced. No calf tenderness, nagative homan sign. Plan to continue PT with likely discharge  to SNF in coming days.  Denies fevers, chills.

## 2021-01-26 NOTE — Progress Notes (Signed)
   01/26/21 1628  Assess: MEWS Score  BP 99/68  Pulse Rate (!) 110  SpO2 100 %  Assess: MEWS Score  MEWS Temp 0  MEWS Systolic 1  MEWS Pulse 1  MEWS RR 0  MEWS LOC 0  MEWS Score 2  MEWS Score Color Yellow  Assess: if the MEWS score is Yellow or Red  Were vital signs taken at a resting state? Yes  Focused Assessment No change from prior assessment  Early Detection of Sepsis Score *See Row Information* Low  MEWS guidelines implemented *See Row Information* Yes  Treat  MEWS Interventions Administered scheduled meds/treatments;Other (Comment) (BP meds held, Given/encouraged PO intake)  Pain Scale 0-10  Pain Score 6  Pain Type Surgical pain  Patients Stated Pain Goal 2  Pain Intervention(s) Medication (See eMAR);Food;Repositioned  Take Vital Signs  Increase Vital Sign Frequency  Yellow: Q 2hr X 2 then Q 4hr X 2, if remains yellow, continue Q 4hrs  Escalate  MEWS: Escalate Yellow: discuss with charge nurse/RN and consider discussing with provider and RRT  Notify: Charge Nurse/RN  Name of Charge Nurse/RN Notified Manuela Schwartz  Date Charge Nurse/RN Notified 01/26/21  Time Charge Nurse/RN Notified 1945  Document  Patient Outcome Stabilized after interventions;Other (Comment) (Given/Encouraged PO intake, Help pt with mobility/ambualtion)

## 2021-01-26 NOTE — Plan of Care (Signed)

## 2021-01-27 LAB — BASIC METABOLIC PANEL
Anion gap: 12 (ref 5–15)
BUN: 9 mg/dL (ref 8–23)
CO2: 27 mmol/L (ref 22–32)
Calcium: 9.5 mg/dL (ref 8.9–10.3)
Chloride: 97 mmol/L — ABNORMAL LOW (ref 98–111)
Creatinine, Ser: 0.64 mg/dL (ref 0.44–1.00)
GFR, Estimated: >60 mL/min (ref >60)
Glucose, Bld: 122 mg/dL — ABNORMAL HIGH (ref 70–99)
Potassium: 3.1 mmol/L — ABNORMAL LOW (ref 3.5–5.1)
Sodium: 136 mmol/L (ref 135–145)

## 2021-01-27 LAB — CBC
HCT: 28.8 % — ABNORMAL LOW (ref 36.0–46.0)
Hemoglobin: 9.5 g/dL — ABNORMAL LOW (ref 12.0–15.0)
MCH: 26.8 pg (ref 26.0–34.0)
MCHC: 33 g/dL (ref 30.0–36.0)
MCV: 81.4 fL (ref 80.0–100.0)
Platelets: 362 10*3/uL (ref 150–400)
RBC: 3.54 MIL/uL — ABNORMAL LOW (ref 3.87–5.11)
RDW: 14 % (ref 11.5–15.5)
WBC: 7.6 10*3/uL (ref 4.0–10.5)
nRBC: 0 % (ref 0.0–0.2)

## 2021-01-27 NOTE — Progress Notes (Signed)
Physical Therapy Treatment Patient Details Name: Patty Howard MRN: [id number] DOB: 1956-02-04 Today's Date: 01/27/2021    History of Present Illness 65 yo female with onset of OA that has not responded to nonsurgical conservative measures has been admitted for THA L LE with direct anterior approach, WBAT.  PMHx:  OA with subchondral sclerosis and osteophytes L hip, atherosclerosis, asthma, COPD, L radial head fracture, chestpain, pancolitis, HTN, anxiety, sleep apnea    PT Comments     Supine in bed on arrival and eager to move OOB to recliner.  Pt performed gt training and LE HEP.  Plan to issue HEP next session.  Pt remains appropriate for SNF placement as she lives alone and continues to require assistance with functional mobility.      Follow Up Recommendations  SNF     Equipment Recommendations  Rolling walker with 5" wheels    Recommendations for Other Services       Precautions / Restrictions Precautions Precautions: Anterior Hip;Fall Restrictions Weight Bearing Restrictions: Yes LLE Weight Bearing: Weight bearing as tolerated    Mobility  Bed Mobility Overal bed mobility: Needs Assistance Bed Mobility: Supine to Sit     Supine to sit: Supervision     General bed mobility comments: Increased time and effort but did not need gt belt to advance surgical leg.    Transfers Overall transfer level: Needs assistance Equipment used: Rolling walker (2 wheeled) Transfers: Sit to/from Stand Sit to Stand: Min assist         General transfer comment: required boost to rise into standing.  Ambulation/Gait Ambulation/Gait assistance: Min guard Gait Distance (Feet): 75 Feet Assistive device: Rolling walker (2 wheeled) Gait Pattern/deviations: Step-to pattern;Decreased step length - right;Decreased step length - left;Decreased weight shift to left;Trunk flexed Gait velocity: reduced, but improved from am session.   General Gait Details: Pt with more shuffling  this session and required cues for L foot clearance.  Pt does fatigue with increased gt distance and required x 3 standing rest periods.  Cues to step into stability of the RW.   Stairs             Wheelchair Mobility    Modified Rankin (Stroke Patients Only)       Balance Overall balance assessment: Needs assistance Sitting-balance support: Feet supported Sitting balance-Leahy Scale: Good       Standing balance-Leahy Scale: Fair Standing balance comment: requires B UE support and min assist                            Cognition Arousal/Alertness: Awake/alert Behavior During Therapy: WFL for tasks assessed/performed Overall Cognitive Status: Within Functional Limits for tasks assessed                                        Exercises General Exercises - Lower Extremity Ankle Circles/Pumps: AROM;Both;10 reps;Supine Quad Sets: AROM;Left;10 reps;Supine Long Arc Quad: AROM;Left;10 reps;Seated Heel Slides: AAROM;Left;10 reps;Supine Hip ABduction/ADduction: AAROM;Left;10 reps;Supine    General Comments        Pertinent Vitals/Pain Pain Assessment: Faces Pain Score: 7  Pain Location: L hip Pain Descriptors / Indicators: Aching;Grimacing;Guarding;Discomfort Pain Intervention(s): Monitored during session;Repositioned    Home Living                      Prior Function  PT Goals (current goals can now be found in the care plan section) Acute Rehab PT Goals Potential to Achieve Goals: Good Progress towards PT goals: Progressing toward goals    Frequency    7X/week      PT Plan Current plan remains appropriate    Co-evaluation              AM-PAC PT "6 Clicks" Mobility   Outcome Measure  Help needed turning from your back to your side while in a flat bed without using bedrails?: A Little Help needed moving from lying on your back to sitting on the side of a flat bed without using bedrails?: A  Little Help needed moving to and from a bed to a chair (including a wheelchair)?: A Little Help needed standing up from a chair using your arms (e.g., wheelchair or bedside chair)?: A Little Help needed to walk in hospital room?: A Little Help needed climbing 3-5 steps with a railing? : A Little 6 Click Score: 18    End of Session Equipment Utilized During Treatment: Gait belt Activity Tolerance: Patient limited by fatigue Patient left: with call bell/phone within reach;in chair;with chair alarm set Nurse Communication: Mobility status PT Visit Diagnosis: Unsteadiness on feet (R26.81);Pain;Muscle weakness (generalized) (M62.81);Other abnormalities of gait and mobility (R26.89);Difficulty in walking, not elsewhere classified (R26.2) Pain - Right/Left: Left Pain - part of body: Leg     Time: 1112-1140 PT Time Calculation (min) (ACUTE ONLY): 28 min  Charges:  $Gait Training: 8-22 mins $Therapeutic Exercise: 8-22 mins                     Erasmo Leventhal , PTA Acute Rehabilitation Services Pager [phone] Office [phone]    Amin Fornwalt Eli Hose 01/27/2021, 1:03 PM

## 2021-01-27 NOTE — Progress Notes (Signed)
Subjective: Patient doing well.  Good pain control left hip.  Denies chest pain shortness of breath.  She has had some cough but nonproductive.  She is awaiting discharge to skilled nursing facility.  Objective: Vital signs in last 24 hours: Temp:  [98.1 F (36.7 C)-98.8 F (37.1 C)] 98.2 F (36.8 C) (08/15 0915) Pulse Rate:  [94-110] 98 (08/15 0915) Resp:  [18] 18 (08/15 0431) BP: (96-124)/(60-78) 124/78 (08/15 0915) SpO2:  [98 %-100 %] 98 % (08/15 0915)  Intake/Output from previous day: 08/14 0701 - 08/15 0700 In: 240 [P.O.:240] Out: 1 [Stool:1] Intake/Output this shift: No intake/output data recorded.  No results for input(s): HGB in the last 72 hours. No results for input(s): WBC, RBC, HCT, PLT in the last 72 hours. Recent Labs    01/25/21 0847  NA 136  K 3.0*  CL 99  CO2 28  BUN 11  CREATININE 0.75  GLUCOSE 112*  CALCIUM 9.2   No results for input(s): LABPT, INR in the last 72 hours.  Exam Very pleasant black female alert and oriented in no acute distress.  Left hip wound looks good.  Stable intact.  No drainage or signs of infection.  Bilateral calves nontender.    Assessment/Plan: -Patient okay to discharge to skilled nurse facility when bed available. -D/C IV -Dressing change left hip      Benjiman Core 01/27/2021, 12:16 PM

## 2021-01-27 NOTE — Progress Notes (Signed)
Pt places self on/off cpap as needed.  RT available if needed.

## 2021-01-27 NOTE — TOC Progression Note (Addendum)
Transition of Care [facility]) - Progression Note    Patient Details  Name: Patty Howard MRN: [id number] Date of Birth: May 12, 1956  Transition of Care [facility]) CM/SW Contact  Emeterio Reeve, [place] Phone Number: 01/27/2021, 2:58 PM  Clinical Narrative:     CSW gave pt bed offers. Pt chose [facility] for SNF. CSW called [employer] to update facility choice. [facility] is still pending.   Expected Discharge Plan: Skilled Nursing Facility Barriers to Discharge: Ship broker, SNF Pending bed offer  Expected Discharge Plan and Services Expected Discharge Plan: [place]                                               Social Determinants of Health (SDOH) Interventions    Readmission Risk Interventions No flowsheet data found.  Emeterio Reeve, [place] Clinical Social Worker [phone]

## 2021-01-28 LAB — SARS CORONAVIRUS 2 (TAT 6-24 HRS): SARS Coronavirus 2: NEGATIVE

## 2021-01-28 NOTE — TOC Progression Note (Signed)
Transition of Care [facility]) - Progression Note    Patient Details  Name: Patty Howard MRN: [id number] Date of Birth: [birth year]  Transition of Care [facility]) CM/SW Contact  Emeterio Reeve, [place] Phone Number: 01/28/2021, 1:35 PM  Clinical Narrative:     CSW called [employer] for update. [employer] rep informed CSW that they have pts name spelled wrong in their system and is working to have it corrected. They still have the auth pending until that change can be made.   Expected Discharge Plan: Skilled Nursing Facility Barriers to Discharge: Ship broker, SNF Pending bed offer  Expected Discharge Plan and Services Expected Discharge Plan: [place]                                               Social Determinants of Health (SDOH) Interventions    Readmission Risk Interventions No flowsheet data found.  Emeterio Reeve,  Clinical Social Worker [phone]

## 2021-01-28 NOTE — Progress Notes (Signed)
   Subjective: 6 Days Post-Op Procedure(s) (LRB): LEFT TOTAL HIP ARTHROPLASTY ANTERIOR APPROACH (Left) Patient reports pain as mild.    Objective: Vital signs in last 24 hours: Temp:  [98.2 F (36.8 C)-99.9 F (37.7 C)] 98.4 F (36.9 C) (08/16 0455) Pulse Rate:  [90-107] 90 (08/16 0455) Resp:  [17-18] 17 (08/16 0455) BP: (109-134)/(68-78) 124/71 (08/16 0455) SpO2:  [98 %-100 %] 100 % (08/16 0455)  Intake/Output from previous day: No intake/output data recorded. Intake/Output this shift: No intake/output data recorded.  Recent Labs    01/27/21 1241  HGB 9.5*   Recent Labs    01/27/21 1241  WBC 7.6  RBC 3.54*  HCT 28.8*  PLT 362   Recent Labs    01/25/21 0847 01/27/21 1241  NA 136 136  K 3.0* 3.1*  CL 99 97*  CO2 28 27  BUN 11 9  CREATININE 0.75 0.64  GLUCOSE 112* 122*  CALCIUM 9.2 9.5   No results for input(s): LABPT, INR in the last 72 hours.  Neurologically intact No results found.  Assessment/Plan: 6 Days Post-Op Procedure(s) (LRB): LEFT TOTAL HIP ARTHROPLASTY ANTERIOR APPROACH (Left) Discharge to SNF  Patty Howard 01/28/2021, 7:58 AM

## 2021-01-28 NOTE — Progress Notes (Signed)
CPAP is set up at bedside. Patient stated she will place herself on without assistance when she is ready. RT will monitor as needed.

## 2021-01-28 NOTE — Progress Notes (Signed)
65 year old black female with history of end-stage DJD left hip and pain comes in for preop evaluation.  States that hip symptoms unchanged from previous visit.  She is wanting to proceed with left total hip replacement as scheduled.  We have not yet received preop pulmonary clearance.  Today history and physical performed.  Review of systems positive for history of sleep apnea and CPAP use.  Patient does have a cough.  Surgical procedure discussed with patient along with potential hospital setting.  All questions answered.Marland Kitchen

## 2021-01-28 NOTE — Progress Notes (Signed)
Physical Therapy Treatment Patient Details Name: Patty Howard MRN: [id number] DOB: February 15, 1956 Today's Date: 01/28/2021    History of Present Illness 65 yo female with onset of OA that has not responded to nonsurgical conservative measures has been admitted for THA L LE with direct anterior approach, WBAT.  PMHx:  OA with subchondral sclerosis and osteophytes L hip, atherosclerosis, asthma, COPD, L radial head fracture, chestpain, pancolitis, HTN, anxiety, sleep apnea    PT Comments    Pt supine in bed on arrival this session.  Pt required cues for sequencing and quad activation during L stance phase as she was noted to present with buckling during gt sequencing.  Continue to recommend snf placement for strengthening and improvement of function before returning home.     Follow Up Recommendations  SNF     Equipment Recommendations  Rolling walker with 5" wheels    Recommendations for Other Services Rehab consult     Precautions / Restrictions Precautions Precautions: Anterior Hip;Fall Precaution Comments: premedicate Restrictions Weight Bearing Restrictions: Yes LLE Weight Bearing: Weight bearing as tolerated    Mobility  Bed Mobility Overal bed mobility: Needs Assistance Bed Mobility: Supine to Sit;Sit to Supine     Supine to sit: Supervision Sit to supine: Supervision   General bed mobility comments: Increased time and effort but did not need gt belt to advance surgical leg.  Used gt belt for back to bed assistance.    Transfers Overall transfer level: Needs assistance Equipment used: Rolling walker (2 wheeled) Transfers: Sit to/from Stand Sit to Stand: Min guard         General transfer comment: required boost to rise into standing.  Ambulation/Gait Ambulation/Gait assistance: Min guard;Min assist Gait Distance (Feet): 100 Feet Assistive device: Rolling walker (2 wheeled) Gait Pattern/deviations: Step-to pattern;Decreased step length -  right;Decreased step length - left;Decreased weight shift to left;Trunk flexed Gait velocity: reduced.   General Gait Details: Noted buckling in L stance phase x 5 with cues to progress to step through pattern this did occur when she returned back to step to pattern as well.   Stairs             Wheelchair Mobility    Modified Rankin (Stroke Patients Only)       Balance Overall balance assessment: Needs assistance Sitting-balance support: Feet supported         Standing balance-Leahy Scale: Fair                              Cognition Arousal/Alertness: Awake/alert Behavior During Therapy: WFL for tasks assessed/performed Overall Cognitive Status: Within Functional Limits for tasks assessed                                 General Comments: following along with mobility instructions but physically limited, limited recall of exercises from this morning.      Exercises General Exercises - Lower Extremity Ankle Circles/Pumps: AROM;Both;10 reps;Supine Quad Sets: AROM;Left;10 reps;Supine Long Arc Quad: AROM;Left;10 reps;Seated Heel Slides: AAROM;Left;10 reps;Supine Hip ABduction/ADduction: AAROM;Left;10 reps;Supine Hip Flexion/Marching: Left;10 reps;Supine;AAROM    General Comments        Pertinent Vitals/Pain Pain Assessment: Faces Pain Score: 7  Pain Location: L hip Pain Descriptors / Indicators: Aching;Grimacing;Guarding;Discomfort Pain Intervention(s): Monitored during session;Repositioned;Ice applied    Home Living  Prior Function            PT Goals (current goals can now be found in the care plan section) Acute Rehab PT Goals Patient Stated Goal: to get back home Potential to Achieve Goals: Good Progress towards PT goals: Progressing toward goals    Frequency    7X/week      PT Plan Current plan remains appropriate    Co-evaluation              AM-PAC PT "6 Clicks"  Mobility   Outcome Measure  Help needed turning from your back to your side while in a flat bed without using bedrails?: A Little Help needed moving from lying on your back to sitting on the side of a flat bed without using bedrails?: A Little Help needed moving to and from a bed to a chair (including a wheelchair)?: A Little Help needed standing up from a chair using your arms (e.g., wheelchair or bedside chair)?: A Little Help needed to walk in hospital room?: A Little Help needed climbing 3-5 steps with a railing? : A Little 6 Click Score: 18    End of Session Equipment Utilized During Treatment: Gait belt Activity Tolerance: Patient limited by fatigue Patient left: with call bell/phone within reach;in chair;with chair alarm set Nurse Communication: Mobility status PT Visit Diagnosis: Unsteadiness on feet (R26.81);Pain;Muscle weakness (generalized) (M62.81);Other abnormalities of gait and mobility (R26.89);Difficulty in walking, not elsewhere classified (R26.2) Pain - Right/Left: Left Pain - part of body: Leg     Time: 1550-1615 PT Time Calculation (min) (ACUTE ONLY): 25 min  Charges:  $Gait Training: 8-22 mins $Therapeutic Exercise: 8-22 mins                     Erasmo Leventhal , PTA Acute Rehabilitation Services Pager [phone] Office [phone]    Dajahnae Vondra Eli Hose 01/28/2021, 4:28 PM

## 2021-01-29 ENCOUNTER — Encounter: Payer: Medicare Other | Admitting: Orthopaedic Surgery

## 2021-01-29 NOTE — Progress Notes (Signed)
Obtained InterDry for pt for under bilateral breast folds, educated pt on its use and to not wash it.

## 2021-01-29 NOTE — Progress Notes (Signed)
Physical Therapy Treatment Patient Details Name: Patty Howard MRN: [id number] DOB: 05-18-1956 Today's Date: 01/29/2021    History of Present Illness 65 yo female admitted on 01/22/21 for THA L LE with direct anterior approach, WBAT.  PMHx:  OA with subchondral sclerosis and osteophytes L hip, atherosclerosis, asthma, COPD, L radial head fracture, chestpain, pancolitis, HTN, anxiety, sleep apnea    PT Comments    Pt preformed all in bed exercises.  She reported that she just got into bed and was up all morning, has done several laps around the room and just wanted to do her exercises.  Agreeable to make sure she does hallway ambulation if still here tomorrow with PT.  PT will attempt to come in the AM. PT will continue to follow acutely for safe mobility progression.  Follow Up Recommendations  SNF     Equipment Recommendations  Rolling walker with 5" wheels    Recommendations for Other Services       Precautions / Restrictions Precautions Precautions: Fall Restrictions LLE Weight Bearing: Weight bearing as tolerated    Mobility  Bed Mobility               General bed mobility comments: pt able to scoot to [facility] with cues for 1/2 bridge and bed in trendelenberg    Transfers                    Ambulation/Gait                 Stairs             Wheelchair Mobility    Modified Rankin (Stroke Patients Only)       Balance                                            Cognition Arousal/Alertness: Awake/alert Behavior During Therapy: WFL for tasks assessed/performed Overall Cognitive Status: Within Functional Limits for tasks assessed                                        Exercises Total Joint Exercises Ankle Circles/Pumps: AROM;Both;20 reps Quad Sets: AROM;Both;10 reps Gluteal Sets: AROM;Both;10 reps Heel Slides: AAROM;Left;10 reps Hip ABduction/ADduction: AROM;AAROM;Right;Left;10 reps     General Comments        Pertinent Vitals/Pain Pain Assessment: Faces Faces Pain Scale: Hurts a little bit Pain Location: L hip Pain Descriptors / Indicators: Grimacing;Guarding Pain Intervention(s): Limited activity within patient's tolerance;Monitored during session;Repositioned    Home Living                      Prior Function            PT Goals (current goals can now be found in the care plan section) Acute Rehab PT Goals Patient Stated Goal: to get back home Progress towards PT goals: Progressing toward goals    Frequency    7X/week      PT Plan Current plan remains appropriate    Co-evaluation              AM-PAC PT "6 Clicks" Mobility   Outcome Measure  Help needed turning from your back to your side while in a flat bed without using bedrails?: A Little Help needed moving from lying  on your back to sitting on the side of a flat bed without using bedrails?: A Little Help needed moving to and from a bed to a chair (including a wheelchair)?: A Little Help needed standing up from a chair using your arms (e.g., wheelchair or bedside chair)?: A Little Help needed to walk in hospital room?: A Little Help needed climbing 3-5 steps with a railing? : A Little 6 Click Score: 18    End of Session   Activity Tolerance: Patient limited by fatigue Patient left: in bed;with call bell/phone within reach   PT Visit Diagnosis: Unsteadiness on feet (R26.81);Pain;Muscle weakness (generalized) (M62.81);Other abnormalities of gait and mobility (R26.89);Difficulty in walking, not elsewhere classified (R26.2) Pain - Right/Left: Left Pain - part of body: Leg     Time: 1275-1700 PT Time Calculation (min) (ACUTE ONLY): 14 min  Charges:  $Therapeutic Exercise: 8-22 mins                     Verdene Lennert, PT, DPT  Acute Rehabilitation Ortho Tech Supervisor [phone] pager [phone]) [phone] office

## 2021-01-29 NOTE — Progress Notes (Signed)
Per Social Work patient is waiting to discharge due to a miss spelling in her last name needing to be fixed before insurance can be authorized.

## 2021-01-29 NOTE — Progress Notes (Signed)
Subjective: Patient seen earlier this evening.  Doing well.  Pain controlled.  Waiting for transfer to SNF. Apparently there was some error with  Documentation that was sent out.    Objective: Vital signs in last 24 hours: Temp:  [97.6 F (36.4 C)-98.2 F (36.8 C)] 97.7 F (36.5 C) (08/17 0802) Pulse Rate:  [90-98] 92 (08/17 0802) Resp:  [18] 18 (08/17 0802) BP: (117-125)/(63-70) 117/65 (08/17 0802) SpO2:  [99 %-100 %] 100 % (08/17 0922)  Intake/Output from previous day: 08/16 0701 - 08/17 0700 In: 240 [P.O.:240] Out: -  Intake/Output this shift: No intake/output data recorded.  Recent Labs    01/27/21 1241  HGB 9.5*   Recent Labs    01/27/21 1241  WBC 7.6  RBC 3.54*  HCT 28.8*  PLT 362   Recent Labs    01/27/21 1241  NA 136  K 3.1*  CL 97*  CO2 27  BUN 9  CREATININE 0.64  GLUCOSE 122*  CALCIUM 9.5   No results for input(s): LABPT, INR in the last 72 hours.  Exam: Pleasant female alert and oriented. NAD. Wound looks good. Staples intact.  No drainage or signs of infection.  Bilat calves nontender.  .     Assessment/Plan: -transfer to SNF when CSW corrects error and bed available.  Patient has been ready for transfer.       Benjiman Core 01/29/2021, 11:11 PM

## 2021-01-30 ENCOUNTER — Telehealth: Payer: Self-pay | Admitting: Orthopaedic Surgery

## 2021-01-30 ENCOUNTER — Other Ambulatory Visit: Payer: Self-pay | Admitting: Orthopaedic Surgery

## 2021-01-30 MED ORDER — HYDROCODONE-ACETAMINOPHEN 5-325 MG PO TABS: 1.0000 | ORAL_TABLET | Freq: Four times a day (QID) | ORAL | 0 refills | Status: DC | PRN

## 2021-01-30 MED ORDER — METHOCARBAMOL 500 MG PO TABS: 500.0000 mg | ORAL_TABLET | Freq: Four times a day (QID) | ORAL | 0 refills | Status: DC

## 2021-01-30 NOTE — Telephone Encounter (Signed)
Please advise 

## 2021-01-30 NOTE — Progress Notes (Signed)
Patient got with nursing home decided she is not staying is not happy.  Son and her sister will be staying with her.  We will order some home health physical therapy for ambulation with a walker post total of arthroplasty.  Robaxin and Norco was sent into her pharmacy [facility] on [address].  She will get aspirin 1 tablet daily for 1 month for DVT prophylaxis.

## 2021-01-30 NOTE — Telephone Encounter (Signed)
Patient called. She says she is not staying in the rehab facility. She is not comfortable. Would like to be discharged home today. Her call back number is [phone]

## 2021-01-30 NOTE — Progress Notes (Signed)
   Subjective: 8 Days Post-Op Procedure(s) (LRB): LEFT TOTAL HIP ARTHROPLASTY ANTERIOR APPROACH (Left) Patient reports pain as mild.    Objective: Vital signs in last 24 hours: Temp:  [98.2 F (36.8 C)] 98.2 F (36.8 C) (08/18 0439) Pulse Rate:  [85] 85 (08/18 0439) Resp:  [18] 18 (08/18 0439) BP: (126)/(65) 126/65 (08/18 0439) SpO2:  [98 %-100 %] 98 % (08/18 0439)  Intake/Output from previous day: 08/17 0701 - 08/18 0700 In: 360 [P.O.:360] Out: -  Intake/Output this shift: No intake/output data recorded.  Recent Labs    01/27/21 1241  HGB 9.5*   Recent Labs    01/27/21 1241  WBC 7.6  RBC 3.54*  HCT 28.8*  PLT 362   Recent Labs    01/27/21 1241  NA 136  K 3.1*  CL 97*  CO2 27  BUN 9  CREATININE 0.64  GLUCOSE 122*  CALCIUM 9.5   No results for input(s): LABPT, INR in the last 72 hours.  Neurologically intact No results found.  Assessment/Plan: 8 Days Post-Op Procedure(s) (LRB): LEFT TOTAL HIP ARTHROPLASTY ANTERIOR APPROACH (Left) Plan:  day 8 , waiting on SNF.   [name] 01/30/2021, 8:07 AM

## 2021-01-30 NOTE — Progress Notes (Signed)
Pt discharged to [facility] this pm. Report called and given to Precious at receiving facility, all questions addressed. Pt discharged in stable condition

## 2021-01-30 NOTE — TOC Transition Note (Signed)
Transition of Care [facility]) - CM/SW Discharge Note   Patient Details  Name: Patty Howard MRN: [id number] Date of Birth: [birth year]  [facility] CM/SW Contact:  Emeterio Reeve, LCSW Phone Number: 01/30/2021, 11:49 AM   Clinical Narrative:     Patient will DC to: [facility] Anticipated DC date: 01/30/21 Family notified: Pt will notify family Transport by: Corey Harold     Per MD patient ready for DC to  [facility]. RN, patient, patient's family, and facility notified of DC. Discharge Summary and FL2 sent to facility. DC packet on chart. Insurance Josem Kaufmann has been received and pt is covid negative. Ambulance transport requested for patient.    RN to call report to [phone].  CSW will sign off for now as social work intervention is no longer needed. Please consult [place] again if new needs arise.   Final next level of care: Skilled Nursing Facility Barriers to Discharge: Barriers Resolved   Patient Goals and CMS Choice Patient states their goals for this hospitalization and ongoing recovery are:: To go back home after Rehab CMS Medicare.gov Compare Post Acute Care list provided to:: Patient Choice offered to / list presented to : Patient  Discharge Placement              Patient chooses bed at: Other - please specify in the comment section below: ([facility]) Patient to be transferred to facility by: Ptar Name of family member notified: Pt will notify family Patient and family notified of of transfer: 01/30/21  Discharge Plan and Services                                     Social Determinants of Health (SDOH) Interventions     Readmission Risk Interventions No flowsheet data found.   Emeterio Reeve, [place] Clinical Social Worker [phone]

## 2021-01-31 ENCOUNTER — Other Ambulatory Visit: Payer: Self-pay | Admitting: Allergy & Immunology

## 2021-01-31 NOTE — Telephone Encounter (Signed)
Can you please help? Thanks.

## 2021-01-31 NOTE — Telephone Encounter (Signed)
Noted. Thanks.

## 2021-02-06 ENCOUNTER — Ambulatory Visit (INDEPENDENT_AMBULATORY_CARE_PROVIDER_SITE_OTHER): Payer: Medicare Other | Admitting: Surgery

## 2021-02-06 ENCOUNTER — Encounter: Payer: Self-pay | Admitting: Surgery

## 2021-02-06 ENCOUNTER — Ambulatory Visit: Payer: Self-pay

## 2021-02-06 VITALS — BP 111/72 | HR 101

## 2021-02-06 DIAGNOSIS — Z96642 Presence of left artificial hip joint: Secondary | ICD-10-CM

## 2021-02-06 DIAGNOSIS — Z9889 Other specified postprocedural states: Secondary | ICD-10-CM

## 2021-02-06 NOTE — Progress Notes (Signed)
Post-Op Visit Note   Patient: Patty Howard           Date of Birth: January 06, 1956           MRN: [id number] Visit Date: 02/06/2021 PCP: Buzzy Han, MD   Assessment & Plan:  Chief Complaint:  Chief Complaint  Patient presents with   Left Hip - Post-op Follow-up  65 year old black female who is 2-week status post left total hip replacement returns.  States that she is doing well.  She self discharged from skilled facility due to not feeling comfortable there.  States that her hip is doing well. Visit Diagnoses:  1. Post-operative state   2. Status post total hip replacement, left     Plan: Advised patient I will have her come in for nurse visit with Kayla on Monday for wound check and staple removal.  I will have patient follow-up with Dr. Lorin Mercy in 4 weeks for recheck.  All questions answered.    Follow-Up Instructions: Return in about 4 weeks (around 03/06/2021) for With Dr. Lorin Mercy for recheck left total hip.   Orders:  Orders Placed This Encounter  Procedures   XR HIP UNILAT W OR W/O PELVIS 2-3 VIEWS LEFT   No orders of the defined types were placed in this encounter.   Imaging: No results found.  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   Diastolic dysfunction 25/36/6440   Asthma exacerbation 11/13/2020   Menopause 11/08/2020   Influenza 10/10/2020   Medication management 04/30/2020   Gastroesophageal reflux disease 03/14/2020   OSA (obstructive sleep apnea) 02/15/2020   Abnormal findings on diagnostic imaging of lung 01/08/2020   Educated about COVID-19 virus infection 11/30/2019   Aortic atherosclerosis ([place]) 11/30/2019   Chest pain 11/26/2019   Acute asthma exacerbation 05/22/2019   Left radial head fracture 02/03/2019   Closed fracture of radial head  01/29/2019   Prediabetes 10/19/2018   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) ([place]) 10/19/2018   Insomnia 10/19/2018   Elbow pain, chronic, left 10/19/2018   Dyspnea 10/12/2017   Pancolitis ([place]) 05/20/2017   H/O Spinal surgery 01/18/2015   Essential hypertension 01/18/2015   Obesity 01/18/2015   Past Medical History:  Diagnosis Date   Anxiety    Arthritis    Asthma    COPD (chronic obstructive pulmonary disease) ([place])    Fracture    closed displaced left radial head   GERD (gastroesophageal reflux disease)    Hypertension    Sleep apnea    does not wear CPAP    Family History  Problem Relation Age of Onset   Hypertension Mother    Stroke Mother    Cancer Father     Past Surgical History:  Procedure Laterality Date   BACK SURGERY     spinal stimulator   BREAST BIOPSY Right 2017   benign   COLONOSCOPY W/ BIOPSIES AND POLYPECTOMY     DILATION AND CURETTAGE OF UTERUS     LAPAROSCOPIC ROUX-EN-Y GASTRIC BYPASS WITH UPPER ENDOSCOPY AND REMOVAL OF LAP BAND     RADIAL HEAD ARTHROPLASTY Left 02/03/2019   Procedure: LEFT RADIAL HEAD ARTHROPLASTY;  Surgeon: [name], MD;  Location: [facility];  Service: Orthopedics;  Laterality: Left;  AXILLARY BLOCK VS BIER BLOCK   TOTAL HIP ARTHROPLASTY Left 01/22/2021   Procedure: LEFT  TOTAL HIP ARTHROPLASTY ANTERIOR APPROACH;  Surgeon: [name], MD;  Location: [facility];  Service: Orthopedics;  Laterality: Left;   TUBAL LIGATION     Social History   Occupational History   Not on file  Tobacco Use   Smoking status: Former    Packs/day: 0.50    Years: 10.00    Pack years: 5.00    Types: Cigarettes    Quit date: 06/15/1998    Years since quitting: 22.6   Smokeless tobacco: Never  Vaping Use   Vaping Use: Never used  Substance and Sexual Activity   Alcohol use: Never   Drug use: Never   Sexual activity: Not on file   Exam Very pleasant female alert and oriented in no acute distress.  Hip wound looks good.  Staples  intact.  No drainage or signs infection.  She does have swelling around the surgical site.  New dressing applied.  Staples not removed today.

## 2021-02-10 ENCOUNTER — Ambulatory Visit: Payer: Medicare Other

## 2021-02-10 ENCOUNTER — Other Ambulatory Visit: Payer: Self-pay

## 2021-02-12 DIAGNOSIS — E039 Hypothyroidism, unspecified: Secondary | ICD-10-CM | POA: Insufficient documentation

## 2021-02-13 ENCOUNTER — Telehealth: Payer: Self-pay | Admitting: Orthopaedic Surgery

## 2021-02-13 ENCOUNTER — Other Ambulatory Visit: Payer: Self-pay | Admitting: Orthopaedic Surgery

## 2021-02-13 MED ORDER — HYDROCODONE-ACETAMINOPHEN 5-325 MG PO TABS: 1.0000 | ORAL_TABLET | Freq: Four times a day (QID) | ORAL | 0 refills | Status: DC | PRN

## 2021-02-13 NOTE — Telephone Encounter (Signed)
Pt called requesting pain medication. Pt states the muscle relaxer is not touching her pain. Pt also cream for her wound. Pt phone number is [phone].

## 2021-02-13 NOTE — Telephone Encounter (Signed)
noted 

## 2021-02-13 NOTE — Telephone Encounter (Signed)
Please advise 

## 2021-02-19 ENCOUNTER — Encounter: Payer: Medicare Other | Admitting: Surgery

## 2021-02-20 ENCOUNTER — Ambulatory Visit: Payer: Medicare Other | Admitting: Pulmonary Disease

## 2021-02-24 ENCOUNTER — Ambulatory Visit (INDEPENDENT_AMBULATORY_CARE_PROVIDER_SITE_OTHER): Payer: Medicare Other | Admitting: *Deleted

## 2021-02-24 ENCOUNTER — Other Ambulatory Visit: Payer: Self-pay

## 2021-02-24 DIAGNOSIS — J4550 Severe persistent asthma, uncomplicated: Secondary | ICD-10-CM

## 2021-02-28 ENCOUNTER — Ambulatory Visit: Payer: Self-pay

## 2021-03-03 ENCOUNTER — Telehealth: Payer: Self-pay | Admitting: Orthopaedic Surgery

## 2021-03-03 DIAGNOSIS — Z96642 Presence of left artificial hip joint: Secondary | ICD-10-CM

## 2021-03-03 NOTE — Telephone Encounter (Signed)
Pt states she needs a referral for physical therapy. She states she is not walking.  CB [phone]

## 2021-03-03 NOTE — Telephone Encounter (Signed)
I left voicemail for patient requesting return call to let me know which facility she would like to go to.

## 2021-03-03 NOTE — Telephone Encounter (Signed)
Please advise 

## 2021-03-05 NOTE — Addendum Note (Signed)
Addended by: Meyer Cory on: 03/05/2021 08:25 AM   Modules accepted: Orders

## 2021-03-05 NOTE — Telephone Encounter (Signed)
Referral placed. I spoke with patient's sister. She states that patient lives close to [address]. Referral put in to [facility] location.

## 2021-03-06 ENCOUNTER — Ambulatory Visit: Payer: Medicare Other | Attending: Orthopaedic Surgery | Admitting: Physical Therapy

## 2021-03-06 ENCOUNTER — Other Ambulatory Visit: Payer: Self-pay

## 2021-03-06 ENCOUNTER — Encounter: Payer: Self-pay | Admitting: Physical Therapy

## 2021-03-06 DIAGNOSIS — M25552 Pain in left hip: Secondary | ICD-10-CM | POA: Diagnosis not present

## 2021-03-06 DIAGNOSIS — R262 Difficulty in walking, not elsewhere classified: Secondary | ICD-10-CM | POA: Diagnosis present

## 2021-03-06 DIAGNOSIS — M25652 Stiffness of left hip, not elsewhere classified: Secondary | ICD-10-CM | POA: Diagnosis present

## 2021-03-06 DIAGNOSIS — M6281 Muscle weakness (generalized): Secondary | ICD-10-CM | POA: Insufficient documentation

## 2021-03-06 NOTE — Therapy (Addendum)
[facility] [address], [place], [place], [address] Phone: [phone]   Fax:  [phone]  Physical Therapy Evaluation  Patient Details  Name: Patty Howard MRN: [id number] Date of Birth: 08-Jan-1956 Referring Provider (PT): [name], MD   Encounter Date: 03/06/2021   PT End of Session - 03/06/21 1651     Visit Number 1    Date for PT Re-Evaluation 05/29/21    Authorization Type UHC medicare    PT Start Time 2229    PT Stop Time 7989    PT Time Calculation (min) 34 min    Activity Tolerance Patient tolerated treatment well    Behavior During Therapy WFL for tasks assessed/performed             Past Medical History:  Diagnosis Date   Anxiety    Arthritis    Asthma    COPD (chronic obstructive pulmonary disease) ([place])    Fracture    closed displaced left radial head   GERD (gastroesophageal reflux disease)    Hypertension    Sleep apnea    does not wear CPAP    Past Surgical History:  Procedure Laterality Date   BACK SURGERY     spinal stimulator   BREAST BIOPSY Right 2017   benign   COLONOSCOPY W/ BIOPSIES AND POLYPECTOMY     DILATION AND CURETTAGE OF UTERUS     LAPAROSCOPIC ROUX-EN-Y GASTRIC BYPASS WITH UPPER ENDOSCOPY AND REMOVAL OF LAP BAND     RADIAL HEAD ARTHROPLASTY Left 02/03/2019   Procedure: LEFT RADIAL HEAD ARTHROPLASTY;  Surgeon: [name], MD;  Location: [place];  Service: Orthopedics;  Laterality: Left;  AXILLARY BLOCK VS BIER BLOCK   TOTAL HIP ARTHROPLASTY Left 01/22/2021   Procedure: LEFT TOTAL HIP ARTHROPLASTY ANTERIOR APPROACH;  Surgeon: [name], MD;  Location: [place];  Service: Orthopedics;  Laterality: Left;   TUBAL LIGATION      There were no vitals filed for this visit.    Subjective Assessment - 03/06/21 1619     Subjective Pt presents to skilled PT due to [place] on August 10th.  Pt has been using standard walker and rollator.  Pt cannot walk without  leaning onto things and limping.  It feels like a deep pain.  It is getting a little better    How long can you sit comfortably? 1 hour before hip hurts    How long can you walk comfortably? 6-8 feet around the house    Patient Stated Goals be able to walk without walker    Currently in Pain? Yes    Pain Score 7     Pain Location Hip    Pain Orientation Mid    Pain Descriptors / Indicators Aching;Burning    Pain Type Acute pain    Pain Radiating Towards numb in anterior thigh    Pain Onset More than a month ago    Pain Frequency Intermittent    Aggravating Factors  walking and weight bearing    Pain Relieving Factors change positions, walk for 10-15 min, ice pack    Effect of Pain on Daily Activities not taking walks in the morning like she used, maybe get back to the gym    Multiple Pain Sites No                [facility] PT Assessment - 03/11/21 0001       Assessment   Medical Diagnosis Z96.642 (ICD-10-CM) - Status  post total hip replacement, left    Referring Provider (PT) [name], MD    Onset Date/Surgical Date 01/22/21    Prior Therapy No      Precautions   Precautions Anterior Hip      Restrictions   Weight Bearing Restrictions No      Balance Screen   Has the patient fallen in the past 6 months No      [place] residence    Living Arrangements Children   my son     Prior Function   Level of Independence Independent      Cognition   Overall Cognitive Status Within Functional Limits for tasks assessed      Functional Tests   Functional tests Squat;Single leg stance      Squat   Comments leans to the Rt      Single Leg Stance   Comments unable on Lt side      Posture/Postural Control   Posture/Postural Control Postural limitations    Postural Limitations Flexed trunk;Anterior pelvic tilt;Weight shift right      ROM / Strength   AROM / PROM / Strength PROM;Strength      PROM   Overall PROM Comments Lt hip  flexion and ER 75%      Strength   Overall Strength Comments Lt hip 4/5      Flexibility   Soft Tissue Assessment /Muscle Length yes    Hamstrings tight      Palpation   Palpation comment lateral left hip incision well healed with a lot of adhesion and difficulty moving      Ambulation/Gait   Gait Pattern Trunk flexed    Gait Comments not using RW with her affected leg, needed education and using correctly and then was able to diminish limp, step more evenly, and walk with step through pattern                         Objective measurements completed on examination: See above findings.       [place] Adult PT Treatment/Exercise - 03/11/21 0001       Self-Care   Self-Care Other Self-Care Comments    Other Self-Care Comments  initial HEP, scar massage, gait training with RW                     PT Education - 03/06/21 1708     Education Details Access Code: [email or web address]    Person(s) Educated Patient    Methods Explanation;Demonstration;Tactile cues;Verbal cues;Handout    Comprehension Verbalized understanding;Returned demonstration              PT Short Term Goals - 03/11/21 1146       PT SHORT TERM GOAL #1   Title Pt able to walk without limp or using AD for one lap around gym or about 45f.    Time 6    Period Weeks    Status New    Target Date 04/17/21      PT SHORT TERM GOAL #2   Title ind with intial HEP    Time 6    Period Weeks    Status New    Target Date 04/17/21               PT Long Term Goals - 03/11/21 1153       PT LONG TERM GOAL #1   Title Pt will be able  to walk 15 min without AD to return to normal community activities    Time 12    Period Weeks    Status New    Target Date 05/29/21      PT LONG TERM GOAL #2   Title ind with advanced HEP    Time 12    Period Weeks    Status New    Target Date 05/29/21      PT LONG TERM GOAL #3   Title Pt will have 5/5 hip flexion strength for improved negotiation of  stairs    Time 12    Period Weeks    Status New    Target Date 05/29/21      PT LONG TERM GOAL #4   Title Pt will report at least 75% less pain during normal daily activites    Time 12    Period Weeks    Status New    Target Date 05/29/21      PT LONG TERM GOAL #5   Title Pt will be able to walk up and down one flight of steps reciprocally not limited due to pain    Time 12    Period Weeks    Status New    Target Date 05/29/21                    Plan - 03/11/21 1125     Clinical Impression Statement Pt is very friendly 65 y/o female who presents to clinic today s/p Lt THA anterior approach.  She is flexed in her trunk and enters clinic with RW but is walking with antalgic gait and not using the RW correctly.  Pt demonstrates more hip flexion in standing on the Lt side, has increased lubmar lordosis and anterior pelvic tilt.  Pt places more weight on Rt LE with squat and unable to fully WB on Lt LE without significant pain.  Pt has fully healed incision with tight and immobile scar tissue throughout.  Pt has MMT 4/5 Lt hip.  Pt has decreased hip extension and does not get to neutral.  Pt was educated on gait, scar massage and intial HEP today.  Pt will benefit from skilled PT to address impairments and return to full functional activities.    Personal Factors and Comorbidities Comorbidity 1    Comorbidities s/p Lt anterior hip replacement    Examination-Activity Limitations Squat;Stairs;Locomotion Level    Examination-Participation Restrictions Community Activity    Stability/Clinical Decision Making Stable/Uncomplicated    Clinical Decision Making Low    Rehab Potential Excellent    PT Frequency 1x / week    PT Duration 12 weeks    PT Treatment/Interventions ADLs/Self Care Home Management;Aquatic Therapy;Cryotherapy;Electrical Stimulation;Moist Heat;Therapeutic activities;Neuromuscular re-education;Therapeutic exercise;Patient/family education;Manual techniques;Scar  mobilization;Passive range of motion;Dry needling;Taping    PT Next Visit Plan NEEDS FOTO; f/u on MD if there are any remaining precautions; f/u on standing and progress weight bearing; review gait with RW as needed; aquatic therapy in plan only if she continues to have issues with weight bearing on land    PT Home Exercise Plan Access Code: [email or web address]    Consulted and Agree with Plan of Care Patient             Patient will benefit from skilled therapeutic intervention in order to improve the following deficits and impairments:  Pain, Postural dysfunction, Impaired flexibility, Increased fascial restricitons, Decreased strength, Difficulty walking, Decreased balance, Decreased range of motion, Increased muscle spasms  Visit Diagnosis:  Pain in left hip - Plan: PT plan of care cert/re-cert  Stiffness of left hip, not elsewhere classified - Plan: PT plan of care cert/re-cert  Muscle weakness (generalized) - Plan: PT plan of care cert/re-cert  Difficulty in walking, not elsewhere classified - Plan: PT plan of care cert/re-cert     Problem List Patient Active Problem List   Diagnosis Date Noted   History of total hip arthroplasty, left 03/07/2021   Arthritis of left hip 01/22/2021   Trochanteric bursitis, left hip 12/23/2020   Tracheobronchomalacia 27/25/3664   Diastolic dysfunction 40/34/7425   Asthma exacerbation 11/13/2020   Menopause 11/08/2020   Influenza 10/10/2020   Medication management 04/30/2020   Gastroesophageal reflux disease 03/14/2020   OSA (obstructive sleep apnea) 02/15/2020   Abnormal findings on diagnostic imaging of lung 01/08/2020   Educated about COVID-19 virus infection 11/30/2019   Aortic atherosclerosis ([place]) 11/30/2019   Chest pain 11/26/2019   Acute asthma exacerbation 05/22/2019   Left radial head fracture 02/03/2019   Closed fracture of radial head 01/29/2019   Prediabetes 10/19/2018   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) ([place])  10/19/2018   Insomnia 10/19/2018   Elbow pain, chronic, left 10/19/2018   Dyspnea 10/12/2017   Pancolitis ([place]) 05/20/2017   H/O Spinal surgery 01/18/2015   Essential hypertension 01/18/2015   Obesity 01/18/2015    Camillo Flaming Marishka Rentfrow, PT 03/11/2021, 12:09 PM  [facility] [address], [place], [place], [address] Phone: [phone]   Fax:  [phone]  Name: Khalis Hittle MRN: [id number] Date of Birth: [birth year]

## 2021-03-06 NOTE — Patient Instructions (Signed)
Access Code: [email or web address] URL: [email or web address] Date: 03/06/2021 Prepared by: Jari Favre  Exercises Supine Hip Adductor Squeeze with Small Ball - 3 x daily - 7 x weekly - 1 sets - 10 reps - 3 sec hold Supine Posterior Pelvic Tilt - 1 x daily - 7 x weekly - 3 sets - 10 reps Standing Pelvic Tilt - 1 x daily - 7 x weekly - 3 sets - 10 reps Hooklying Isometric Clamshell - 1 x daily - 7 x weekly - 3 sets - 10 reps Hooklying Single Leg March - 1 x daily - 7 x weekly - 3 sets - 10 reps Standing Weight Shift with Head Turn Coordination Opposite Side - 1 x daily - 7 x weekly - 3 sets - 10 reps

## 2021-03-07 ENCOUNTER — Ambulatory Visit (INDEPENDENT_AMBULATORY_CARE_PROVIDER_SITE_OTHER): Payer: Medicare Other | Admitting: Orthopaedic Surgery

## 2021-03-07 ENCOUNTER — Encounter: Payer: Self-pay | Admitting: Orthopaedic Surgery

## 2021-03-07 DIAGNOSIS — Z96642 Presence of left artificial hip joint: Secondary | ICD-10-CM

## 2021-03-07 HISTORY — DX: Presence of left artificial hip joint: Z96.642

## 2021-03-07 NOTE — Progress Notes (Signed)
Post-Op Visit Note   Patient: Patty Howard           Date of Birth: [birth year]           MRN: [id number] Visit Date: 03/07/2021 PCP: Buzzy Han, MD   Assessment & Plan: Follow-up left total hip arthroplasty.  Leg lengths equal.  She still has Trendelenburg gait we discussed abduction strengthening exercises which she will do on her own.  She has been using the walker and cane was provided today and appropriate sequencing discussed.  Return 1 month to check her gait.  She is happy with the results of surgery improvement in her preop pain.  Chief Complaint:  Chief Complaint  Patient presents with   Left Hip - Follow-up    01/22/2021 left THA   Visit Diagnoses:  1. History of total hip arthroplasty, left     Plan: Ambulation with cane.  Recheck 1 month.  Follow-Up Instructions: Return in about 1 month (around 04/06/2021).   Orders:  No orders of the defined types were placed in this encounter.  No orders of the defined types were placed in this encounter.   Imaging: No results found.  PMFS History: Patient Active Problem List   Diagnosis Date Noted   History of total hip arthroplasty, left 03/07/2021   Arthritis of left hip 01/22/2021   Trochanteric bursitis, left hip 12/23/2020   Tracheobronchomalacia 14/48/1856   Diastolic dysfunction 31/49/7026   Asthma exacerbation 11/13/2020   Menopause 11/08/2020   Influenza 10/10/2020   Medication management 04/30/2020   Gastroesophageal reflux disease 03/14/2020   OSA (obstructive sleep apnea) 02/15/2020   Abnormal findings on diagnostic imaging of lung 01/08/2020   Educated about COVID-19 virus infection 11/30/2019   Aortic atherosclerosis ([place]) 11/30/2019   Chest pain 11/26/2019   Acute asthma exacerbation 05/22/2019   Left radial head fracture 02/03/2019   Closed fracture of radial head 01/29/2019   Prediabetes 10/19/2018   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) ([place]) 10/19/2018    Insomnia 10/19/2018   Elbow pain, chronic, left 10/19/2018   Dyspnea 10/12/2017   Pancolitis ([place]) 05/20/2017   H/O Spinal surgery 01/18/2015   Essential hypertension 01/18/2015   Obesity 01/18/2015   Past Medical History:  Diagnosis Date   Anxiety    Arthritis    Asthma    COPD (chronic obstructive pulmonary disease) ([place])    Fracture    closed displaced left radial head   GERD (gastroesophageal reflux disease)    Hypertension    Sleep apnea    does not wear CPAP    Family History  Problem Relation Age of Onset   Hypertension Mother    Stroke Mother    Cancer Father     Past Surgical History:  Procedure Laterality Date   BACK SURGERY     spinal stimulator   BREAST BIOPSY Right 2017   benign   COLONOSCOPY W/ BIOPSIES AND POLYPECTOMY     DILATION AND CURETTAGE OF UTERUS     LAPAROSCOPIC ROUX-EN-Y GASTRIC BYPASS WITH UPPER ENDOSCOPY AND REMOVAL OF LAP BAND     RADIAL HEAD ARTHROPLASTY Left 02/03/2019   Procedure: LEFT RADIAL HEAD ARTHROPLASTY;  Surgeon: [name], MD;  Location: [facility];  Service: Orthopedics;  Laterality: Left;  AXILLARY BLOCK VS BIER BLOCK   TOTAL HIP ARTHROPLASTY Left 01/22/2021   Procedure: LEFT TOTAL HIP ARTHROPLASTY ANTERIOR APPROACH;  Surgeon: [name], MD;  Location: [facility];  Service: Orthopedics;  Laterality: Left;   TUBAL LIGATION  Social History   Occupational History   Not on file  Tobacco Use   Smoking status: Former    Packs/day: 0.50    Years: 10.00    Pack years: 5.00    Types: Cigarettes    Quit date: 06/15/1998    Years since quitting: 22.7   Smokeless tobacco: Never  Vaping Use   Vaping Use: Never used  Substance and Sexual Activity   Alcohol use: Never   Drug use: Never   Sexual activity: Not on file

## 2021-03-11 NOTE — Addendum Note (Signed)
Addended by: Su Hoff on: 03/11/2021 12:09 PM   Modules accepted: Orders

## 2021-03-13 ENCOUNTER — Ambulatory Visit: Payer: Medicare Other | Admitting: Physical Therapy

## 2021-03-13 ENCOUNTER — Other Ambulatory Visit: Payer: Self-pay

## 2021-03-13 ENCOUNTER — Encounter: Payer: Self-pay | Admitting: Physical Therapy

## 2021-03-13 DIAGNOSIS — M25652 Stiffness of left hip, not elsewhere classified: Secondary | ICD-10-CM

## 2021-03-13 DIAGNOSIS — M25552 Pain in left hip: Secondary | ICD-10-CM | POA: Diagnosis not present

## 2021-03-13 DIAGNOSIS — R262 Difficulty in walking, not elsewhere classified: Secondary | ICD-10-CM

## 2021-03-13 DIAGNOSIS — M6281 Muscle weakness (generalized): Secondary | ICD-10-CM

## 2021-03-13 NOTE — Therapy (Signed)
[facility] [address], [place], [place], [address] Phone: [phone]   Fax:  [phone]  Physical Therapy Treatment  Patient Details  Name: Patty Howard MRN: [id number] Date of Birth: [birth year] Referring Provider (PT): [name], MD   Encounter Date: 03/13/2021   PT End of Session - 03/13/21 1100     Visit Number 2    Date for PT Re-Evaluation 05/29/21    Authorization Type UHC medicare    PT Start Time 0930    PT Stop Time 7858    PT Time Calculation (min) 44 min    Activity Tolerance Patient tolerated treatment well;No increased pain    Behavior During Therapy WFL for tasks assessed/performed             Past Medical History:  Diagnosis Date   Anxiety    Arthritis    Asthma    COPD (chronic obstructive pulmonary disease) ([place])    Fracture    closed displaced left radial head   GERD (gastroesophageal reflux disease)    Hypertension    Sleep apnea    does not wear CPAP    Past Surgical History:  Procedure Laterality Date   BACK SURGERY     spinal stimulator   BREAST BIOPSY Right 2017   benign   COLONOSCOPY W/ BIOPSIES AND POLYPECTOMY     DILATION AND CURETTAGE OF UTERUS     LAPAROSCOPIC ROUX-EN-Y GASTRIC BYPASS WITH UPPER ENDOSCOPY AND REMOVAL OF LAP BAND     RADIAL HEAD ARTHROPLASTY Left 02/03/2019   Procedure: LEFT RADIAL HEAD ARTHROPLASTY;  Surgeon: [name], MD;  Location: [facility] [address];  Service: Orthopedics;  Laterality: Left;  AXILLARY BLOCK VS BIER BLOCK   TOTAL HIP ARTHROPLASTY Left 01/22/2021   Procedure: LEFT TOTAL HIP ARTHROPLASTY ANTERIOR APPROACH;  Surgeon: [name], MD;  Location: [facility];  Service: Orthopedics;  Laterality: Left;   TUBAL LIGATION      There were no vitals filed for this visit.   Subjective Assessment - 03/13/21 0934     Subjective Pt states that things are going well. She was given a cane at her last MD visit. She has some pain as expected.     How long can you sit comfortably? 1 hour before hip hurts    How long can you walk comfortably? 6-8 feet around the house    Patient Stated Goals be able to walk without walker    Currently in Pain? Yes    Pain Score 6     Pain Location Hip    Pain Orientation Left    Pain Descriptors / Indicators Dull;Aching    Pain Type Surgical pain    Pain Onset More than a month ago    Pain Frequency Intermittent                               [facility] Adult PT Treatment/Exercise - 03/13/21 0001       Ambulation/Gait   Ambulation Distance (Feet) 75 Feet    Assistive device Straight cane    Gait Comments PT cuing to increase      Exercises   Exercises Knee/Hip      Knee/Hip Exercises: Standing   Other Standing Knee Exercises Rt UE/LE marching x5 reps at wall      Knee/Hip Exercises: Seated   Sit to Sand 1 set;10 reps;without UE support   on cushion, Rt  foot forward to encourage weight shift Lt     Knee/Hip Exercises: Supine   Heel Slides Left;AROM;1 set;10 reps    Heel Slides Limitations with glute squeeze at the end    [facility] Strengthening;Both;1 set;10 reps    Straight Leg Raises Left    Straight Leg Raises Limitations attempted, but unable on the Lt      Knee/Hip Exercises: Sidelying   Hip ABduction Strengthening;Left;1 set;5 reps    Hip ABduction Limitations Lt only, PT preventing pt from rolling back      Manual Therapy   Manual Therapy Soft tissue mobilization    Soft tissue mobilization scar mobilization to decrease adhesions                     PT Education - 03/13/21 1058     Education Details scar massage    Person(s) Educated Patient    Methods Explanation;Demonstration    Comprehension Verbalized understanding;Returned demonstration              PT Short Term Goals - 03/11/21 1146       PT SHORT TERM GOAL #1   Title Pt able to walk without limp or using AD for one lap around gym or about 34f.    Time 6    Period Weeks     Status New    Target Date 04/17/21      PT SHORT TERM GOAL #2   Title ind with intial HEP    Time 6    Period Weeks    Status New    Target Date 04/17/21               PT Long Term Goals - 03/11/21 1153       PT LONG TERM GOAL #1   Title Pt will be able to walk 15 min without AD to return to normal community activities    Time 12    Period Weeks    Status New    Target Date 05/29/21      PT LONG TERM GOAL #2   Title ind with advanced HEP    Time 12    Period Weeks    Status New    Target Date 05/29/21      PT LONG TERM GOAL #3   Title Pt will have 5/5 hip flexion strength for improved negotiation of stairs    Time 12    Period Weeks    Status New    Target Date 05/29/21      PT LONG TERM GOAL #4   Title Pt will report at least 75% less pain during normal daily activites    Time 12    Period Weeks    Status New    Target Date 05/29/21      PT LONG TERM GOAL #5   Title Pt will be able to walk up and down one flight of steps reciprocally not limited due to pain    Time 12    Period Weeks    Status New    Target Date 05/29/21                   Plan - 03/13/21 1100     Clinical Impression Statement [facility]. PT spent the beginning of the session providing verbal instruction/cuing to increase step length on the Rt. Pt was able to demonstrate this prior to [place]  fatigue. Also addressed hip strength in various positions. Pt required modification to sit to stand in order to prevent excessive weight shift to the Lt. Pt has scar adhesions throughout the Lt lateral incision. PT educated her on self scar massage and completed this end of session. Pt felt good relief from tightness in the hip end of session.    Personal Factors and Comorbidities Comorbidity 1    Comorbidities s/p Lt anterior hip replacement    Examination-Activity Limitations Squat;Stairs;Locomotion Level     Examination-Participation Restrictions Community Activity    Stability/Clinical Decision Making Stable/Uncomplicated    Rehab Potential Excellent    PT Frequency 1x / week    PT Duration 12 weeks    PT Treatment/Interventions ADLs/Self Care Home Management;Aquatic Therapy;Cryotherapy;Electrical Stimulation;Moist Heat;Therapeutic activities;Neuromuscular re-education;Therapeutic exercise;Patient/family education;Manual techniques;Scar mobilization;Passive range of motion;Dry needling;Taping    PT Next Visit Plan NEEDS FOTO; f/u on standing and progress weight bearing; review gait with SPC as needed; push hip abductor/extensor strength    PT Home Exercise Plan Access Code: [email or web address]    Consulted and Agree with Plan of Care Patient             Patient will benefit from skilled therapeutic intervention in order to improve the following deficits and impairments:  Pain, Postural dysfunction, Impaired flexibility, Increased fascial restricitons, Decreased strength, Difficulty walking, Decreased balance, Decreased range of motion, Increased muscle spasms  Visit Diagnosis: Pain in left hip  Stiffness of left hip, not elsewhere classified  Muscle weakness (generalized)  Difficulty in walking, not elsewhere classified     Problem List Patient Active Problem List   Diagnosis Date Noted   History of total hip arthroplasty, left 03/07/2021   Arthritis of left hip 01/22/2021   Trochanteric bursitis, left hip 12/23/2020   Tracheobronchomalacia 45/62/5638   Diastolic dysfunction 93/73/4287   Asthma exacerbation 11/13/2020   Menopause 11/08/2020   Influenza 10/10/2020   Medication management 04/30/2020   Gastroesophageal reflux disease 03/14/2020   OSA (obstructive sleep apnea) 02/15/2020   Abnormal findings on diagnostic imaging of lung 01/08/2020   Educated about COVID-19 virus infection 11/30/2019   Aortic atherosclerosis ([place]) 11/30/2019   Chest pain 11/26/2019   Acute asthma  exacerbation 05/22/2019   Left radial head fracture 02/03/2019   Closed fracture of radial head 01/29/2019   Prediabetes 10/19/2018   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) ([place]) 10/19/2018   Insomnia 10/19/2018   Elbow pain, chronic, left 10/19/2018   Dyspnea 10/12/2017   Pancolitis ([place]) 05/20/2017   H/O Spinal surgery 01/18/2015   Essential hypertension 01/18/2015   Obesity 01/18/2015    11:04 AM,03/13/21 Sherol Dade PT, DPT [facility] [address], [place], [place], [address] Phone: [phone]   Fax:  [phone]  Name: Patty Howard MRN: [id number] Date of Birth: 1955/07/10

## 2021-03-20 ENCOUNTER — Ambulatory Visit: Payer: Medicare Other | Attending: Family Medicine | Admitting: Physical Therapy

## 2021-03-20 ENCOUNTER — Other Ambulatory Visit: Payer: Self-pay

## 2021-03-20 DIAGNOSIS — R262 Difficulty in walking, not elsewhere classified: Secondary | ICD-10-CM | POA: Insufficient documentation

## 2021-03-20 DIAGNOSIS — M25652 Stiffness of left hip, not elsewhere classified: Secondary | ICD-10-CM | POA: Diagnosis present

## 2021-03-20 DIAGNOSIS — M6281 Muscle weakness (generalized): Secondary | ICD-10-CM | POA: Insufficient documentation

## 2021-03-20 DIAGNOSIS — M25552 Pain in left hip: Secondary | ICD-10-CM | POA: Diagnosis not present

## 2021-03-20 NOTE — Patient Instructions (Signed)
Access Code: [email or web address] URL: [email or web address] Date: 03/20/2021 Prepared by: [place]  Exercises Supine Hip Adductor Squeeze with Small Ball - 3 x daily - 7 x weekly - 1 sets - 10 reps - 3 sec hold Standing Weight Shift with Head Turn Coordination Opposite Side - 1 x daily - 7 x weekly - 3 sets - 10 reps Clamshell with Resistance - 1 x daily - 7 x weekly - 2 sets - 10 reps Sit to Stand Without Arm Support - 1 x daily - 7 x weekly - 2 sets - 10 reps Standing Hip Abduction with Unilateral Counter Support - 2 x daily - 7 x weekly - 1 sets - 10 reps   [facility] [address] [place], [place] [address] Phone # [phone] Fax [phone]

## 2021-03-20 NOTE — Therapy (Signed)
[facility] @ [place], [place], [address] Phone:     Fax:     Physical Therapy Treatment  Patient Details  Name: Patty Howard MRN: [id number] Date of Birth: 06/13/1956 Referring Provider (PT): [name], MD   Encounter Date: 03/20/2021   PT End of Session - 03/20/21 1017     Visit Number 3    Date for PT Re-Evaluation 05/29/21    Authorization Type [employer] medicare    PT Start Time 0933    PT Stop Time 1638    PT Time Calculation (min) 42 min    Activity Tolerance Patient tolerated treatment well;No increased pain    Behavior During Therapy WFL for tasks assessed/performed             Past Medical History:  Diagnosis Date   Anxiety    Arthritis    Asthma    COPD (chronic obstructive pulmonary disease) ([place])    Fracture    closed displaced left radial head   GERD (gastroesophageal reflux disease)    Hypertension    Sleep apnea    does not wear CPAP    Past Surgical History:  Procedure Laterality Date   BACK SURGERY     spinal stimulator   BREAST BIOPSY Right 2017   benign   COLONOSCOPY W/ BIOPSIES AND POLYPECTOMY     DILATION AND CURETTAGE OF UTERUS     LAPAROSCOPIC ROUX-EN-Y GASTRIC BYPASS WITH UPPER ENDOSCOPY AND REMOVAL OF LAP BAND     RADIAL HEAD ARTHROPLASTY Left 02/03/2019   Procedure: LEFT RADIAL HEAD ARTHROPLASTY;  Surgeon: [name], MD;  Location: [facility];  Service: Orthopedics;  Laterality: Left;  AXILLARY BLOCK VS BIER BLOCK   TOTAL HIP ARTHROPLASTY Left 01/22/2021   Procedure: LEFT TOTAL HIP ARTHROPLASTY ANTERIOR APPROACH;  Surgeon: [name], MD;  Location: [facility];  Service: Orthopedics;  Laterality: Left;   TUBAL LIGATION      There were no vitals filed for this visit.   Subjective Assessment - 03/20/21 0936     Subjective Pt states that she is working on her scar massage and thinks this has loosened the hip up tremendously. Some mild pain in the scar region  today.    How long can you sit comfortably? 1 hour before hip hurts    How long can you walk comfortably? 6-8 feet around the house    Patient Stated Goals be able to walk without walker    Currently in Pain? Yes    Pain Score 5     Pain Location --   surgical incision   Pain Onset More than a month ago                               [facility] Adult PT Treatment/Exercise - 03/20/21 0001       Knee/Hip Exercises: Machines for Strengthening   Total Gym Leg Press seat 9: Lt only #60 x10 reps      Knee/Hip Exercises: Standing   Hip Abduction Both;Stengthening;1 set;10 reps   counter support   Other Standing Knee Exercises hip hikes with Rt foot on step 2x10 reps      Knee/Hip Exercises: Seated   Sit to Sand 1 set;10 reps;without UE support      Knee/Hip Exercises: Sidelying   Clams 1st set without resistance, 2x10 reps with green TB Lt only  Manual Therapy   Soft tissue mobilization scar mobilization to decrease adhesions                     PT Education - 03/20/21 1017     Education Details updates to HEP    Person(s) Educated Patient    Methods Explanation    Comprehension Verbalized understanding              PT Short Term Goals - 03/11/21 1146       PT SHORT TERM GOAL #1   Title Pt able to walk without limp or using AD for one lap around gym or about 70f.    Time 6    Period Weeks    Status New    Target Date 04/17/21      PT SHORT TERM GOAL #2   Title ind with intial HEP    Time 6    Period Weeks    Status New    Target Date 04/17/21               PT Long Term Goals - 03/11/21 1153       PT LONG TERM GOAL #1   Title Pt will be able to walk 15 min without AD to return to normal community activities    Time 12    Period Weeks    Status New    Target Date 05/29/21      PT LONG TERM GOAL #2   Title ind with advanced HEP    Time 12    Period Weeks    Status New    Target Date 05/29/21      PT LONG TERM  GOAL #3   Title Pt will have 5/5 hip flexion strength for improved negotiation of stairs    Time 12    Period Weeks    Status New    Target Date 05/29/21      PT LONG TERM GOAL #4   Title Pt will report at least 75% less pain during normal daily activites    Time 12    Period Weeks    Status New    Target Date 05/29/21      PT LONG TERM GOAL #5   Title Pt will be able to walk up and down one flight of steps reciprocally not limited due to pain    Time 12    Period Weeks    Status New    Target Date 05/29/21                   Plan - 03/20/21 1018     Clinical Impression Statement Pt is doing well with her HEP and is now working on her scar mobility more often. She was able to complete exercise progressions to some standing therex without significant postural compensations. Pt's HEP was updated today to reflect progressions in therex. PT educated pt on safe use of the leg press machine and will plan to review several other LE strengthening machines for use at the gym. PT worked on scar mobility this session, noting improvements in this from her last session. Will continue with current POC.    Personal Factors and Comorbidities Comorbidity 1    Comorbidities s/p Lt anterior hip replacement    Examination-Activity Limitations Squat;Stairs;Locomotion Level    Examination-Participation Restrictions Community Activity    Stability/Clinical Decision Making Stable/Uncomplicated    Rehab Potential Excellent    PT Frequency 1x / week  PT Duration 12 weeks    PT Treatment/Interventions ADLs/Self Care Home Management;Aquatic Therapy;Cryotherapy;Electrical Stimulation;Moist Heat;Therapeutic activities;Neuromuscular re-education;Therapeutic exercise;Patient/family education;Manual techniques;Scar mobilization;Passive range of motion;Dry needling;Taping    PT Next Visit Plan provide gym equipment review; review gait with SPC as needed; push hip abductor/extensor strength    PT Home  Exercise Plan Access Code: [email or web address]    [id number] and Agree with Plan of Care Patient             Patient will benefit from skilled therapeutic intervention in order to improve the following deficits and impairments:  Pain, Postural dysfunction, Impaired flexibility, Increased fascial restricitons, Decreased strength, Difficulty walking, Decreased balance, Decreased range of motion, Increased muscle spasms  Visit Diagnosis: Pain in left hip  Muscle weakness (generalized)  Stiffness of left hip, not elsewhere classified  Difficulty in walking, not elsewhere classified     Problem List Patient Active Problem List   Diagnosis Date Noted   History of total hip arthroplasty, left 03/07/2021   Arthritis of left hip 01/22/2021   Trochanteric bursitis, left hip 12/23/2020   Tracheobronchomalacia 15/72/6203   Diastolic dysfunction 55/97/4163   Asthma exacerbation 11/13/2020   Menopause 11/08/2020   Influenza 10/10/2020   Medication management 04/30/2020   Gastroesophageal reflux disease 03/14/2020   OSA (obstructive sleep apnea) 02/15/2020   Abnormal findings on diagnostic imaging of lung 01/08/2020   Educated about COVID-19 virus infection 11/30/2019   Aortic atherosclerosis ([place]) 11/30/2019   Chest pain 11/26/2019   Acute asthma exacerbation 05/22/2019   Left radial head fracture 02/03/2019   Closed fracture of radial head 01/29/2019   Prediabetes 10/19/2018   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) ([place]) 10/19/2018   Insomnia 10/19/2018   Elbow pain, chronic, left 10/19/2018   Dyspnea 10/12/2017   Pancolitis ([place]) 05/20/2017   H/O Spinal surgery 01/18/2015   Essential hypertension 01/18/2015   Obesity 01/18/2015   10:21 AM,03/20/21 Sherol Dade PT, DPT [facility] @ [place], [place], [address] Phone:     Fax:      Name: Patty Howard MRN: [id number] Date of Birth: 1956-06-10

## 2021-03-26 ENCOUNTER — Ambulatory Visit
Admission: RE | Admit: 2021-03-26 | Discharge: 2021-03-26 | Disposition: A | Discharge status: Home / Self Care | Payer: Medicare Other | Source: Ambulatory Visit | Attending: Family Medicine | Admitting: Family Medicine

## 2021-03-26 ENCOUNTER — Other Ambulatory Visit: Payer: Self-pay

## 2021-03-26 ENCOUNTER — Other Ambulatory Visit: Payer: Self-pay | Admitting: Family Medicine

## 2021-03-26 DIAGNOSIS — R059 Cough, unspecified: Secondary | ICD-10-CM

## 2021-03-27 ENCOUNTER — Encounter: Payer: Medicare Other | Admitting: Physical Therapy

## 2021-03-31 ENCOUNTER — Telehealth: Payer: Self-pay | Admitting: Pulmonary Disease

## 2021-03-31 NOTE — Telephone Encounter (Signed)
I called the patient to verify that she was still using her CPAP and the phone went to VM. I wanted to verify that if she is still using the CPAP machine if she could to bring in her CPAP card for a download. Waiting on a call back.

## 2021-04-01 ENCOUNTER — Encounter: Payer: Self-pay | Admitting: Pulmonary Disease

## 2021-04-01 ENCOUNTER — Ambulatory Visit (INDEPENDENT_AMBULATORY_CARE_PROVIDER_SITE_OTHER): Payer: PRIVATE HEALTH INSURANCE | Admitting: Pulmonary Disease

## 2021-04-01 ENCOUNTER — Other Ambulatory Visit: Payer: Self-pay

## 2021-04-01 VITALS — BP 110/70 | HR 104 | Temp 98.4°F | Ht 70.0 in | Wt 273.0 lb

## 2021-04-01 DIAGNOSIS — J4550 Severe persistent asthma, uncomplicated: Secondary | ICD-10-CM

## 2021-04-01 DIAGNOSIS — J441 Chronic obstructive pulmonary disease with (acute) exacerbation: Secondary | ICD-10-CM | POA: Diagnosis not present

## 2021-04-01 MED ORDER — AMOXICILLIN-POT CLAVULANATE 875-125 MG PO TABS: 1.0000 | ORAL_TABLET | Freq: Two times a day (BID) | ORAL | 0 refills | Status: DC

## 2021-04-01 MED ORDER — TRELEGY ELLIPTA 200-62.5-25 MCG/INH IN AEPB: 1.0000 | INHALATION_SPRAY | Freq: Every day | RESPIRATORY_TRACT | 5 refills | Status: DC

## 2021-04-01 MED ORDER — ALBUTEROL SULFATE HFA 108 (90 BASE) MCG/ACT IN AERS: 1.0000 | INHALATION_SPRAY | Freq: Four times a day (QID) | RESPIRATORY_TRACT | 0 refills | Status: DC | PRN

## 2021-04-01 NOTE — Progress Notes (Signed)
Subjective:    Patient ID: Patty Howard, female    DOB: Apr 15, 1956, 65 y.o.   MRN: [id number]  Patient being seen for obstructive sleep apnea She does have a history of obstructive lung disease  Recent worsening shortness of breath led to chest x-ray been performed showing more prominent bronchovascular markings  She is coughing bringing up thick clear phlegm No chest pain or chest discomfort No fever More short of breath than usual  Recently had left hip surgery August and-recovering well  She continues on Trelegy on a regular basis  She is compliant with CPAP use and continues to benefit from it  Diagnosed with obstructive sleep apnea about 8 to 10 years ago Her machine became dysfunctional about a year ago  History of obstructive sleep apnea  Sleep time varies significantly sometimes midnight to 3 AM Takes about 30 minutes to fall asleep Almost 4 nocturnal awakenings on her regular basis Final awakening time about 7 AM  She has managed to lose over 30 pounds and continues to work on weight loss  Has a history of asthma/COPD, history of hypertension  Sister with OSA  Her memory is fine  Past Medical History:  Diagnosis Date   Anxiety    Arthritis    Asthma    COPD (chronic obstructive pulmonary disease) ([place])    Fracture    closed displaced left radial head   GERD (gastroesophageal reflux disease)    Hypertension    Sleep apnea    does not wear CPAP   Social History   Socioeconomic History   Marital status: Widowed    Spouse name: Not on file   Number of children: Not on file   Years of education: Not on file   Highest education level: Not on file  Occupational History   Not on file  Tobacco Use   Smoking status: Former    Packs/day: 0.50    Years: 10.00    Pack years: 5.00    Types: Cigarettes    Quit date: 06/15/1998    Years since quitting: 22.8   Smokeless tobacco: Never  Vaping Use   Vaping Use: Never used  Substance and Sexual  Activity   Alcohol use: Never   Drug use: Never   Sexual activity: Not on file  Other Topics Concern   Not on file  Social History Narrative   Three children.  Engineer, structural in [place].     Social Determinants of Health   Financial Resource Strain: Not on file  Food Insecurity: Not on file  Transportation Needs: Not on file  Physical Activity: Not on file  Stress: Not on file  Social Connections: Not on file  Intimate Partner Violence: Not on file   Family History  Problem Relation Age of Onset   Hypertension Mother    Stroke Mother    Cancer Father     Review of Systems  Constitutional:  Negative for fever and unexpected weight change.  HENT:  Positive for congestion. Negative for dental problem, ear pain, nosebleeds, postnasal drip, rhinorrhea, sinus pressure, sneezing, sore throat and trouble swallowing.   Eyes:  Negative for redness and itching.  Respiratory:  Positive for cough, shortness of breath and wheezing. Negative for chest tightness.   Cardiovascular:  Negative for palpitations and leg swelling.  Gastrointestinal:  Negative for nausea and vomiting.  Genitourinary:  Negative for dysuria.  Musculoskeletal:  Negative for joint swelling.  Skin:  Negative for rash.  Allergic/Immunologic: Positive for environmental allergies  and food allergies. Negative for immunocompromised state.  Neurological:  Negative for headaches.  Hematological:  Does not bruise/bleed easily.  Psychiatric/Behavioral:  Negative for dysphoric mood. The patient is nervous/anxious.       Objective:   Physical Exam Constitutional:      Appearance: She is obese.  HENT:     Head: Normocephalic and atraumatic.  Eyes:     Pupils: Pupils are equal, round, and reactive to light.  Cardiovascular:     Rate and Rhythm: Normal rate.     Pulses: Normal pulses.     Heart sounds: Normal heart sounds. No murmur heard.   No friction rub.  Pulmonary:     Effort: Pulmonary effort is normal. No  respiratory distress.     Breath sounds: No stridor. Rhonchi present. No wheezing.  Musculoskeletal:     Cervical back: No rigidity or tenderness.  Neurological:     General: No focal deficit present.     Mental Status: She is alert.  Psychiatric:        Mood and Affect: Mood normal.   Vitals:   04/01/21 1038  BP: 110/70  Pulse: (!) 104  Temp: 98.4 F (36.9 C)  SpO2: 98%   Results of the Epworth flowsheet 04/05/2019  Sitting and reading 3  Watching TV 3  Sitting, inactive in a public place (e.g. a theatre or a meeting) 3  As a passenger in a car for an hour without a break 3  Lying down to rest in the afternoon when circumstances permit 3  Sitting and talking to someone 3  Sitting quietly after a lunch without alcohol 3  In a car, while stopped for a few minutes in traffic 3  Total score 24   Previous study not available to me at present  Chest x-ray shows increased bronchovascular markings  Continues to be compliant with CPAP use    .  CT scan 11/26/2019 reviewed showing a 5 mm not lung nodule-scans reviewed by myself  -11/11/2020 shows small nodule at the base-benign  Download not available today -100% compliant with use of CPAP   Assessment & Plan:  Severe obstructive sleep apnea .  No issues with CPAP .  Continues to sleep well .  Sleep is described as restorative  Obstructive pulmonary disease/asthma .  We will continue bronchodilators .  Appears exacerbated at present -We will give her a course of Augmentin  Morbid obesity .  Encouraged to continue working on weight loss efforts .  She is achieving significant weight loss recently  Plan: .  Continue CPAP therapy .  Continue Trelegy .  Nebulization treatments as needed .  Augmentin 875 p.o. twice daily for 10 days .  Obtain pulmonary function test at next visit  .  We will see her back in 6 weeks

## 2021-04-01 NOTE — Patient Instructions (Signed)
I will see you back in 6 weeks  Breathing study on the day of visit  Prescription for Augmentin will be sent to the pharmacy for you

## 2021-04-01 NOTE — Telephone Encounter (Signed)
Patient in the office for a visit and she will talk with provider about the CPAP.

## 2021-04-02 ENCOUNTER — Telehealth: Payer: Self-pay | Admitting: Pulmonary Disease

## 2021-04-02 ENCOUNTER — Other Ambulatory Visit: Payer: Self-pay

## 2021-04-02 MED ORDER — IPRATROPIUM-ALBUTEROL 0.5-2.5 (3) MG/3ML IN SOLN: 3.0000 mL | Freq: Four times a day (QID) | RESPIRATORY_TRACT | 3 refills | Status: DC | PRN

## 2021-04-02 MED ORDER — ALBUTEROL SULFATE HFA 108 (90 BASE) MCG/ACT IN AERS: 1.0000 | INHALATION_SPRAY | Freq: Four times a day (QID) | RESPIRATORY_TRACT | 4 refills | Status: DC | PRN

## 2021-04-02 NOTE — Telephone Encounter (Signed)
Sent in refills to pharmacy. [name] to notify [facility].

## 2021-04-03 ENCOUNTER — Telehealth: Payer: Self-pay

## 2021-04-03 ENCOUNTER — Other Ambulatory Visit: Payer: Self-pay

## 2021-04-03 ENCOUNTER — Ambulatory Visit: Payer: Medicare Other

## 2021-04-03 ENCOUNTER — Emergency Department (HOSPITAL_BASED_OUTPATIENT_CLINIC_OR_DEPARTMENT_OTHER): Payer: Medicare Other | Admitting: Radiology

## 2021-04-03 ENCOUNTER — Emergency Department (HOSPITAL_BASED_OUTPATIENT_CLINIC_OR_DEPARTMENT_OTHER)
Admission: EM | Admit: 2021-04-03 | Discharge: 2021-04-03 | Disposition: A | Discharge status: Home / Self Care | Payer: Medicare Other | Attending: Emergency Medicine | Admitting: Emergency Medicine

## 2021-04-03 ENCOUNTER — Encounter (HOSPITAL_BASED_OUTPATIENT_CLINIC_OR_DEPARTMENT_OTHER): Payer: Self-pay | Admitting: Obstetrics and Gynecology

## 2021-04-03 DIAGNOSIS — M436 Torticollis: Secondary | ICD-10-CM | POA: Insufficient documentation

## 2021-04-03 DIAGNOSIS — J449 Chronic obstructive pulmonary disease, unspecified: Secondary | ICD-10-CM | POA: Diagnosis not present

## 2021-04-03 DIAGNOSIS — M47812 Spondylosis without myelopathy or radiculopathy, cervical region: Secondary | ICD-10-CM | POA: Insufficient documentation

## 2021-04-03 DIAGNOSIS — M542 Cervicalgia: Secondary | ICD-10-CM | POA: Diagnosis present

## 2021-04-03 DIAGNOSIS — I10 Essential (primary) hypertension: Secondary | ICD-10-CM | POA: Insufficient documentation

## 2021-04-03 DIAGNOSIS — Z87891 Personal history of nicotine dependence: Secondary | ICD-10-CM | POA: Diagnosis not present

## 2021-04-03 MED ORDER — LIDOCAINE 5 % EX PTCH
1.0000 | MEDICATED_PATCH | CUTANEOUS | Status: DC
  Administered 2021-04-03: 1 via TRANSDERMAL
  Filled 2021-04-03: qty 1

## 2021-04-03 MED ORDER — DIAZEPAM 2 MG PO TABS
2.0000 mg | ORAL_TABLET | Freq: Once | ORAL | Status: AC
  Administered 2021-04-03: 2 mg via ORAL
  Filled 2021-04-03: qty 1

## 2021-04-03 MED ORDER — MEDI-PATCH-LIDOCAINE 0.5-0.035-5-20 % EX PTCH: 1.0000 | MEDICATED_PATCH | Freq: Every day | CUTANEOUS | 0 refills | Status: DC

## 2021-04-03 MED ORDER — DIAZEPAM 5 MG PO TABS: 2.5000 mg | ORAL_TABLET | Freq: Four times a day (QID) | ORAL | 0 refills | Status: DC | PRN

## 2021-04-03 MED ORDER — KETOROLAC TROMETHAMINE 60 MG/2ML IM SOLN
30.0000 mg | Freq: Once | INTRAMUSCULAR | Status: AC
  Administered 2021-04-03: 30 mg via INTRAMUSCULAR
  Filled 2021-04-03: qty 2

## 2021-04-03 NOTE — ED Triage Notes (Signed)
Patient reports neck pain x3 days that is causing head pain as well. States she has been doing home massage to the area with no relief.

## 2021-04-03 NOTE — Telephone Encounter (Signed)
PT called pt due to no-show appt.  PT left message for pt.

## 2021-04-03 NOTE — ED Provider Notes (Signed)
[facility] DEPT Provider Note   CSN: [id number] Arrival date & time: 04/03/21  0032     History Chief Complaint  Patient presents with  . Neck Pain    Patty Howard is a 65 y.o. female.  Patient woke up three days ago with neck pain worse with movement. Hurts mostly on right but also midline. She feels like it is swollen too. No fevers. No headaches. No neurologic changes. No trauma.    Neck Pain     Past Medical History:  Diagnosis Date  . Anxiety   . Arthritis   . Asthma   . COPD (chronic obstructive pulmonary disease) ([place])   . Fracture    closed displaced left radial head  . GERD (gastroesophageal reflux disease)   . Hypertension   . Sleep apnea    does not wear CPAP    Patient Active Problem List   Diagnosis Date Noted  . History of total hip arthroplasty, left 03/07/2021  . Arthritis of left hip 01/22/2021  . Trochanteric bursitis, left hip 12/23/2020  . 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  . Diastolic dysfunction 72/02/4708  . Asthma exacerbation 11/13/2020  . Menopause 11/08/2020  . Influenza 10/10/2020  . Medication management 04/30/2020  . Gastroesophageal reflux disease 03/14/2020  . OSA (obstructive sleep apnea) 02/15/2020  . Abnormal findings on diagnostic imaging of lung 01/08/2020  . Educated about COVID-19 virus infection 11/30/2019  . Aortic atherosclerosis ([place]) 11/30/2019  . Chest pain 11/26/2019  . Acute asthma exacerbation 05/22/2019  . Left radial head fracture 02/03/2019  . Closed fracture of radial head 01/29/2019  . Prediabetes 10/19/2018  . 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) ([place]) 10/19/2018  . Insomnia 10/19/2018  . Elbow pain, chronic, left 10/19/2018  . Dyspnea 10/12/2017  . Pancolitis ([place]) 05/20/2017  . H/O Spinal surgery 01/18/2015  . Essential hypertension 01/18/2015  . Obesity 01/18/2015    Past Surgical History:  Procedure Laterality Date  . BACK SURGERY      spinal stimulator  . BREAST BIOPSY Right 2017   benign  . COLONOSCOPY W/ BIOPSIES AND POLYPECTOMY    . DILATION AND CURETTAGE OF UTERUS    . LAPAROSCOPIC ROUX-EN-Y GASTRIC BYPASS WITH UPPER ENDOSCOPY AND REMOVAL OF LAP BAND    . RADIAL HEAD ARTHROPLASTY Left 02/03/2019   Procedure: LEFT RADIAL HEAD ARTHROPLASTY;  Surgeon: [name], MD;  Location: [facility];  Service: Orthopedics;  Laterality: Left;  AXILLARY BLOCK VS BIER BLOCK  . TOTAL HIP ARTHROPLASTY Left 01/22/2021   Procedure: LEFT TOTAL HIP ARTHROPLASTY ANTERIOR APPROACH;  Surgeon: [name], MD;  Location: [facility];  Service: Orthopedics;  Laterality: Left;  . TUBAL LIGATION       OB History     Gravida      Para      Term      Preterm      AB      Living  3      SAB      IAB      Ectopic      Multiple      Live Births              Family History  Problem Relation Age of Onset  . Hypertension Mother   . Stroke Mother   . Cancer Father     Social History   Tobacco Use  . Smoking status: Former    Packs/day: 0.50    Years: 10.00    Pack years: 5.00  Types: Cigarettes    Quit date: 06/15/1998    Years since quitting: 22.8  . Smokeless tobacco: Never  Vaping Use  . Vaping Use: Never used  Substance Use Topics  . Alcohol use: Never  . Drug use: Never    Home Medications Prior to Admission medications   Medication Sig Start Date End Date Taking? Authorizing Provider  diazepam (VALIUM) 5 MG tablet Take 0.5 tablets (2.5 mg total) by mouth every 6 (six) hours as needed for muscle spasms (spasms). 04/03/21  Yes Rhiana Morash, Corene Cornea, MD  Lido-Capsaicin-Men-Methyl Sal (MEDI-PATCH-LIDOCAINE) 0.5-0.035-5-20 % [facility] Apply 1 patch topically daily. 04/03/21  Yes Indalecio Malmstrom, Corene Cornea, MD  albuterol (PROVENTIL) (2.5 MG/3ML) 0.083% nebulizer solution Take 2.5 mg by nebulization every 6 (six) hours as needed for wheezing or shortness of breath.    [provider]  albuterol (VENTOLIN HFA) 108 (90 Base) MCG/ACT  inhaler Inhale 1-2 puffs into the lungs every 6 (six) hours as needed for wheezing or shortness of breath. 04/02/21   Olalere, Adewale A, MD  amLODipine (NORVASC) 5 MG tablet TAKE 1 TABLET (5 MG TOTAL) BY MOUTH DAILY. 09/24/20 04/01/21  Minus Breeding, MD  amoxicillin-clavulanate (AUGMENTIN) 875-125 MG tablet Take 1 tablet by mouth 2 (two) times daily. 04/01/21   Laurin Coder, MD  ascorbic acid (VITAMIN C) 500 MG tablet Take 500 mg by mouth daily.    [provider]  aspirin EC 325 MG EC tablet Take 1 tablet (325 mg total) by mouth daily with breakfast. Must take at least 4 weeks postop for DVT prophylaxis 01/25/21   Lanae Crumbly, PA-C  atorvastatin (LIPITOR) 10 MG tablet Take 10 mg by mouth daily.    [provider]  benzonatate (TESSALON) 200 MG capsule Take 1 capsule (200 mg total) by mouth 3 (three) times daily. 11/18/20   Aline August, MD  Diethylpropion HCl CR 75 MG TB24 Take 75 mg by mouth every morning. 11/02/20   [provider]  Fluticasone-Umeclidin-Vilant (TRELEGY ELLIPTA) 200-62.5-25 MCG/INH AEPB Inhale 1 puff into the lungs daily. 04/01/21   Olalere, Ernesto Rutherford, MD  furosemide (LASIX) 40 MG tablet Take 1 tablet (40 mg total) by mouth daily as needed for fluid or edema (WEIGHT GAIN OF 2LBS, SHORTNESS OF BREATH). Patient taking differently: Take 40 mg by mouth daily. 06/10/20 01/22/21  Minus Breeding, MD  HYDROcodone-acetaminophen (NORCO/VICODIN) 5-325 MG tablet Take 1 tablet by mouth every 6 (six) hours as needed for moderate pain. 02/13/21   [name], MD  ipratropium-albuterol (DUONEB) 0.5-2.5 (3) MG/3ML SOLN Take 3 mLs by nebulization every 6 (six) hours as needed. 04/02/21   Laurin Coder, MD  levocetirizine (XYZAL) 5 MG tablet TAKE 1 TABLET BY MOUTH EVERY DAY IN THE EVENING 02/03/21   Valentina Shaggy, MD  levothyroxine (SYNTHROID) 100 MCG tablet Take 100 mcg by mouth daily before breakfast.    [provider]  linaclotide (LINZESS)  290 MCG CAPS capsule Take 290 mcg by mouth daily before breakfast.    [provider]  losartan-hydrochlorothiazide (HYZAAR) 100-25 MG tablet Take 1 tablet by mouth daily. 05/24/20   Minus Breeding, MD  methocarbamol (ROBAXIN) 500 MG tablet Take 1 tablet (500 mg total) by mouth every 8 (eight) hours as needed for muscle spasms. 01/24/21   Lanae Crumbly, PA-C  methocarbamol (ROBAXIN) 500 MG tablet Take 1 tablet (500 mg total) by mouth 4 (four) times daily. 01/30/21   [name], MD  montelukast (SINGULAIR) 10 MG tablet TAKE 1 TABLET  BY MOUTH EVERYDAY AT BEDTIME 02/03/21   Valentina Shaggy, MD  Multiple Vitamin (MULTIVITAMIN WITH MINERALS) TABS tablet Take 1 tablet by mouth daily. Centrum Multivitamin    [provider]  pantoprazole (PROTONIX) 40 MG tablet TAKE 1 TABLET BY MOUTH EVERY DAY Patient taking differently: Take 40 mg by mouth daily as needed (acid reflux). 06/17/20   Olalere, Cicero Duck A, MD  sodium chloride HYPERTONIC 3 % nebulizer solution Take by nebulization as needed for other or cough. Twice a day as needed for chest congestion 11/22/20   Martyn Ehrich, NP  Vitamin D, Ergocalciferol, (DRISDOL) 1.25 MG (50000 UNIT) CAPS capsule Take 50,000 Units by mouth every Monday.  10/27/19   [provider]    Allergies    Banana  Review of Systems   Review of Systems  Musculoskeletal:  Positive for neck pain.  All other systems reviewed and are negative.  Physical Exam Updated Vital Signs BP 137/84 (BP Location: Right Arm)   Pulse 98   Temp 99 F (37.2 C) (Oral)   Resp 18   Ht 5' 10"  (1.778 m)   Wt 123.8 kg   SpO2 98%   BMI 39.16 kg/m   Physical Exam Vitals and nursing note reviewed.  Constitutional:      Appearance: She is well-developed.  HENT:     Head: Normocephalic and atraumatic.     Nose: No congestion or rhinorrhea.     Mouth/Throat:     Mouth: Mucous membranes are moist.     Pharynx: Oropharynx is clear.  Eyes:     Pupils:  Pupils are equal, round, and reactive to light.  Cardiovascular:     Rate and Rhythm: Normal rate and regular rhythm.  Pulmonary:     Effort: No respiratory distress.     Breath sounds: No stridor.  Abdominal:     General: There is no distension.  Musculoskeletal:        General: No swelling or tenderness. Normal range of motion.     Cervical back: Normal range of motion.  Skin:    General: Skin is warm and dry.  Neurological:     General: No focal deficit present.     Mental Status: She is alert.    ED Results / Procedures / Treatments   Labs (all labs ordered are listed, but only abnormal results are displayed) Labs Reviewed - No data to display  EKG None  Radiology DG Cervical Spine 2-3 Views  Result Date: 04/03/2021 CLINICAL DATA:  Neck pain for 3 days, no known injury, initial encounter EXAM: CERVICAL SPINE - 3 VIEW COMPARISON:  None. FINDINGS: Seven cervical segments are well visualized. Vertebral body height is well maintained. Disc space narrowing and osteophytic changes are noted at C5-6 and C6-7. Mild osteophytic changes are noted at C3-4. No soft tissue abnormality is seen. Facet hypertrophic changes are noted. The odontoid is within normal limits. IMPRESSION: Multilevel degenerative change without acute abnormality. Electronically Signed   By: Inez Catalina M.D.   On: 04/03/2021 02:44   DG Thoracic Spine 2 View  Result Date: 04/03/2021 CLINICAL DATA:  Back pain for several days, no known injury, initial encounter EXAM: THORACIC SPINE 2 VIEWS COMPARISON:  None. FINDINGS: Vertebral body height is well maintained. Gastric lap band is noted. Mild degenerative changes of the thoracic spine are seen. Pedicles are within normal limits and no paraspinal mass is seen. Degenerative changes in the lower cervical spine are noted as well. IMPRESSION: Degenerative change without acute  abnormality. Electronically Signed   By: Inez Catalina M.D.   On: 04/03/2021 02:44     Procedures Procedures   Medications Ordered in ED Medications  lidocaine (LIDODERM) 5 % 1 patch (1 patch Transdermal Patch Applied 04/03/21 0133)  ketorolac (TORADOL) injection 30 mg (30 mg Intramuscular Given 04/03/21 0132)  diazepam (VALIUM) tablet 2 mg (2 mg Oral Given 04/03/21 0300)    ED Course  I have reviewed the triage vital signs and the nursing notes.  Pertinent labs & imaging results that were available during my care of the patient were reviewed by me and considered in my medical decision making (see chart for details).    MDM Rules/Calculators/A&P                         Pain improved. Xr's ok. Suspect torticollis, will treat for same. Doubt ascess, fracture or other emergent causes rquring further workup/admission.   Final Clinical Impression(s) / ED Diagnoses Final diagnoses:  Torticollis    Rx / DC Orders ED Discharge Orders          Ordered    diazepam (VALIUM) 5 MG tablet  Every 6 hours PRN        04/03/21 0256    Lido-Capsaicin-Men-Methyl Sal (MEDI-PATCH-LIDOCAINE) 0.5-0.035-5-20 % [facility]  Daily        04/03/21 0256             Domini Vandehei, Corene Cornea, MD 04/03/21 [phone]

## 2021-04-10 ENCOUNTER — Telehealth: Payer: Self-pay

## 2021-04-10 ENCOUNTER — Ambulatory Visit: Payer: Medicare Other

## 2021-04-10 NOTE — Telephone Encounter (Signed)
PT called pt due to no-show appt.  Left voicemail for her to return call. Pt will be discharged if she does not attend her next session.

## 2021-04-17 ENCOUNTER — Ambulatory Visit: Payer: Medicare Other | Attending: Family Medicine

## 2021-04-17 ENCOUNTER — Other Ambulatory Visit: Payer: Self-pay

## 2021-04-17 DIAGNOSIS — M25552 Pain in left hip: Secondary | ICD-10-CM | POA: Insufficient documentation

## 2021-04-17 DIAGNOSIS — M25652 Stiffness of left hip, not elsewhere classified: Secondary | ICD-10-CM | POA: Insufficient documentation

## 2021-04-17 DIAGNOSIS — R262 Difficulty in walking, not elsewhere classified: Secondary | ICD-10-CM | POA: Diagnosis present

## 2021-04-17 DIAGNOSIS — M6281 Muscle weakness (generalized): Secondary | ICD-10-CM | POA: Diagnosis present

## 2021-04-17 NOTE — Therapy (Addendum)
[facility] @ [place], [place], [address] Phone: [phone]   Fax:  [phone]  Physical Therapy Treatment  Patient Details  Name: Patty Howard MRN: [id number] Date of Birth: Jan 17, 1956 Referring Provider (PT): [name], MD   Encounter Date: 04/17/2021   PT End of Session - 04/17/21 1016     Visit Number 4    Date for PT Re-Evaluation 05/29/21    Authorization Type [employer] medicare    PT Start Time 0935    PT Stop Time 1015    PT Time Calculation (min) 40 min    Activity Tolerance Patient tolerated treatment well;No increased pain    Behavior During Therapy WFL for tasks assessed/performed             Past Medical History:  Diagnosis Date   Anxiety    Arthritis    Asthma    COPD (chronic obstructive pulmonary disease) ([place])    Fracture    closed displaced left radial head   GERD (gastroesophageal reflux disease)    Hypertension    Sleep apnea    does not wear CPAP    Past Surgical History:  Procedure Laterality Date   BACK SURGERY     spinal stimulator   BREAST BIOPSY Right 2017   benign   COLONOSCOPY W/ BIOPSIES AND POLYPECTOMY     DILATION AND CURETTAGE OF UTERUS     LAPAROSCOPIC ROUX-EN-Y GASTRIC BYPASS WITH UPPER ENDOSCOPY AND REMOVAL OF LAP BAND     RADIAL HEAD ARTHROPLASTY Left 02/03/2019   Procedure: LEFT RADIAL HEAD ARTHROPLASTY;  Surgeon: [name], MD;  Location: [facility];  Service: Orthopedics;  Laterality: Left;  AXILLARY BLOCK VS BIER BLOCK   TOTAL HIP ARTHROPLASTY Left 01/22/2021   Procedure: LEFT TOTAL HIP ARTHROPLASTY ANTERIOR APPROACH;  Surgeon: [name], MD;  Location: [facility];  Service: Orthopedics;  Laterality: Left;   TUBAL LIGATION      There were no vitals filed for this visit.   Subjective Assessment - 04/17/21 0938     Subjective I am doing my exercises.  I'm still limping.    Currently in Pain? Yes    Pain Score 4     Pain Location Hip    Pain  Orientation Left    Pain Descriptors / Indicators Aching    Pain Type Acute pain    Pain Onset More than a month ago    Pain Frequency Constant    Aggravating Factors  numbness in leg, walking and standing    Pain Relieving Factors sitting, massage, heat                               [facility] Adult PT Treatment/Exercise - 04/17/21 0001       Exercises   Exercises Knee/Hip      Knee/Hip Exercises: Stretches   Hip Flexor Stretch Left;3 reps;20 seconds      Knee/Hip Exercises: Aerobic   Nustep Level 1x 8 min- PT present to discuss progress      Knee/Hip Exercises: Machines for Strengthening   Total Gym Leg Press seat 9: Lt only 60# 2x10  reps      Knee/Hip Exercises: Standing   Hip Abduction Both;Stengthening;1 set;10 reps   counter support   Hip Extension Stengthening;2 sets;10 reps      Knee/Hip Exercises: Seated   Sit to Sand 2 sets;10 reps   blue  pad in chair     Knee/Hip Exercises: Supine   Other Supine Knee/Hip Exercises hip adduction with ball 5" hold 2x10      Manual Therapy   Soft tissue mobilization scar mobilization to decrease adhesions                       PT Short Term Goals - 03/11/21 1146       PT SHORT TERM GOAL #1   Title Pt able to walk without limp or using AD for one lap around gym or about 36f.    Time 6    Period Weeks    Status New    Target Date 04/17/21      PT SHORT TERM GOAL #2   Title ind with intial HEP    Time 6    Period Weeks    Status New    Target Date 04/17/21               PT Long Term Goals - 03/11/21 1153       PT LONG TERM GOAL #1   Title Pt will be able to walk 15 min without AD to return to normal community activities    Time 12    Period Weeks    Status New    Target Date 05/29/21      PT LONG TERM GOAL #2   Title ind with advanced HEP    Time 12    Period Weeks    Status New    Target Date 05/29/21      PT LONG TERM GOAL #3   Title Pt will have 5/5 hip flexion  strength for improved negotiation of stairs    Time 12    Period Weeks    Status New    Target Date 05/29/21      PT LONG TERM GOAL #4   Title Pt will report at least 75% less pain during normal daily activites    Time 12    Period Weeks    Status New    Target Date 05/29/21      PT LONG TERM GOAL #5   Title Pt will be able to walk up and down one flight of steps reciprocally not limited due to pain    Time 12    Period Weeks    Status New    Target Date 05/29/21                   Plan - 04/17/21 1015     Clinical Impression Statement Pt with lapse in treatment since 03/20/21 with 2 no-show appts.  Pt reports that she is going to the gym 3x/wk and doing her HEP.  Pt presents with significant antalgia with gait using cane and reduced time on Lt LE with all standing activity.  PT provided verbal and tactile cues for alignment, Rt=Lt weightbearing and activation of glute med.  Pt with tissue adhesions over Lt anterior hip incision and responded well to manual therapy.  Pt will see MD tomorrow to discuss continued pain and antalgia.    PT Frequency 1x / week    PT Duration 12 weeks    PT Treatment/Interventions ADLs/Self Care Home Management;Aquatic Therapy;Cryotherapy;Electrical Stimulation;Moist Heat;Therapeutic activities;Neuromuscular re-education;Therapeutic exercise;Patient/family education;Manual techniques;Scar mobilization;Passive range of motion;Dry needling;Taping    PT Next Visit Plan see what MD says, work on gait, weight shifting, strength and scar mobility    PT Home Exercise Plan Access Code:  [email or web address]    Consulted and Agree with Plan of Care Patient             Patient will benefit from skilled therapeutic intervention in order to improve the following deficits and impairments:  Pain, Postural dysfunction, Impaired flexibility, Increased fascial restricitons, Decreased strength, Difficulty walking, Decreased balance, Decreased range of motion, Increased  muscle spasms  Visit Diagnosis: Pain in left hip  Muscle weakness (generalized)  Stiffness of left hip, not elsewhere classified  Difficulty in walking, not elsewhere classified     Problem List Patient Active Problem List   Diagnosis Date Noted   History of total hip arthroplasty, left 03/07/2021   Arthritis of left hip 01/22/2021   Trochanteric bursitis, left hip 12/23/2020   Tracheobronchomalacia 01/60/1093   Diastolic dysfunction 23/55/7322   Asthma exacerbation 11/13/2020   Menopause 11/08/2020   Influenza 10/10/2020   Medication management 04/30/2020   Gastroesophageal reflux disease 03/14/2020   OSA (obstructive sleep apnea) 02/15/2020   Abnormal findings on diagnostic imaging of lung 01/08/2020   Educated about COVID-19 virus infection 11/30/2019   Aortic atherosclerosis ([place]) 11/30/2019   Chest pain 11/26/2019   Acute asthma exacerbation 05/22/2019   Left radial head fracture 02/03/2019   Closed fracture of radial head 01/29/2019   Prediabetes 10/19/2018   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) ([place]) 10/19/2018   Insomnia 10/19/2018   Elbow pain, chronic, left 10/19/2018   Dyspnea 10/12/2017   Pancolitis ([place]) 05/20/2017   H/O Spinal surgery 01/18/2015   Essential hypertension 01/18/2015   Obesity 01/18/2015   Sigurd Sos, PT 04/17/21 10:18 AM  PHYSICAL THERAPY DISCHARGE SUMMARY  Visits from Start of Care: 4  Current functional level related to goals / functional outcomes: See above for current status.  Pt with limited attendance and didn't return after last session.    Remaining deficits: See above    Education / Equipment: HEP   Patient agrees to discharge. Patient goals were not met. Patient is being discharged due to being pleased with the current functional level.  Sigurd Sos, PT 05/29/21 4:23 PM  [facility] @ [place], [place], [address] Phone:  [phone]   Fax:  [phone]  Name: Myya Meenach MRN: [id number] Date of Birth: 04/05/1956

## 2021-04-18 ENCOUNTER — Encounter: Payer: Self-pay | Admitting: Orthopaedic Surgery

## 2021-04-18 ENCOUNTER — Telehealth: Payer: Self-pay

## 2021-04-18 ENCOUNTER — Ambulatory Visit (INDEPENDENT_AMBULATORY_CARE_PROVIDER_SITE_OTHER): Payer: BC Managed Care – PPO | Admitting: Orthopaedic Surgery

## 2021-04-18 VITALS — BP 135/77 | Ht 70.0 in | Wt 271.0 lb

## 2021-04-18 DIAGNOSIS — Z96642 Presence of left artificial hip joint: Secondary | ICD-10-CM

## 2021-04-18 NOTE — Telephone Encounter (Signed)
Pt stopped at the front desk to sign a release form to obtain her last colon records from 5 years ago. Pt stated that she is due for another one. Form was faxed and scanned into pt's chart under demographics. Original form and fax confirmation was mailed to pt.

## 2021-04-18 NOTE — Progress Notes (Signed)
Post-Op Visit Note   Patient: Patty Howard           Date of Birth: 06/19/1955           MRN: [id number] Visit Date: 04/18/2021 PCP: Buzzy Han, MD   Assessment & Plan post left total hip arthroplasty 01/22/2021.  Still has Trendelenburg gait she is still in therapy.  We went over again lateral position and leg abduction exercise to strengthen the abductor.  She will do these twice a day recheck 3 months.  Chief Complaint:  Chief Complaint  Patient presents with   Left Hip - Follow-up    01/22/2021 Left THA   Visit Diagnoses:  1. History of total hip arthroplasty, left     Plan: Continue abduction strengthening of EXTR Trendelenburg gait recheck 3 months.  She is able to demonstrate correct exercise technique today on exam table.  Follow-Up Instructions: Return in about 3 months (around 07/19/2021).   Orders:  No orders of the defined types were placed in this encounter.  No orders of the defined types were placed in this encounter.   Imaging: No results found.  PMFS History: Patient Active Problem List   Diagnosis Date Noted   History of total hip arthroplasty, left 03/07/2021   Trochanteric bursitis, left hip 12/23/2020   Tracheobronchomalacia 75/79/7282   Diastolic dysfunction 11/13/5613   Asthma exacerbation 11/13/2020   Menopause 11/08/2020   Influenza 10/10/2020   Medication management 04/30/2020   Gastroesophageal reflux disease 03/14/2020   OSA (obstructive sleep apnea) 02/15/2020   Abnormal findings on diagnostic imaging of lung 01/08/2020   Educated about COVID-19 virus infection 11/30/2019   Aortic atherosclerosis ([place]) 11/30/2019   Chest pain 11/26/2019   Acute asthma exacerbation 05/22/2019   Left radial head fracture 02/03/2019   Closed fracture of radial head 01/29/2019   Prediabetes 10/19/2018   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) ([place]) 10/19/2018   Insomnia 10/19/2018   Elbow pain, chronic, left  10/19/2018   Dyspnea 10/12/2017   Pancolitis ([place]) 05/20/2017   H/O Spinal surgery 01/18/2015   Essential hypertension 01/18/2015   Obesity 01/18/2015   Past Medical History:  Diagnosis Date   Anxiety    Arthritis    Asthma    COPD (chronic obstructive pulmonary disease) ([place])    Fracture    closed displaced left radial head   GERD (gastroesophageal reflux disease)    Hypertension    Sleep apnea    does not wear CPAP    Family History  Problem Relation Age of Onset   Hypertension Mother    Stroke Mother    Cancer Father     Past Surgical History:  Procedure Laterality Date   BACK SURGERY     spinal stimulator   BREAST BIOPSY Right 2017   benign   COLONOSCOPY W/ BIOPSIES AND POLYPECTOMY     DILATION AND CURETTAGE OF UTERUS     LAPAROSCOPIC ROUX-EN-Y GASTRIC BYPASS WITH UPPER ENDOSCOPY AND REMOVAL OF LAP BAND     RADIAL HEAD ARTHROPLASTY Left 02/03/2019   Procedure: LEFT RADIAL HEAD ARTHROPLASTY;  Surgeon: [name], MD;  Location: [facility];  Service: Orthopedics;  Laterality: Left;  AXILLARY BLOCK VS BIER BLOCK   TOTAL HIP ARTHROPLASTY Left 01/22/2021   Procedure: LEFT TOTAL HIP ARTHROPLASTY ANTERIOR APPROACH;  Surgeon: [name], MD;  Location: [facility];  Service: Orthopedics;  Laterality: Left;   TUBAL LIGATION     Social History   Occupational History   Not on  file  Tobacco Use   Smoking status: Former    Packs/day: 0.50    Years: 10.00    Pack years: 5.00    Types: Cigarettes    Quit date: 06/15/1998    Years since quitting: 22.8   Smokeless tobacco: Never  Vaping Use   Vaping Use: Never used  Substance and Sexual Activity   Alcohol use: Never   Drug use: Never   Sexual activity: Yes

## 2021-04-21 ENCOUNTER — Ambulatory Visit: Payer: Medicare Other

## 2021-04-22 NOTE — Telephone Encounter (Signed)
Called and left detailed message for pt about scripts being sent to pharmacy and to call back if anything further is needed.

## 2021-04-23 ENCOUNTER — Encounter: Payer: Self-pay | Admitting: Internal Medicine

## 2021-04-23 ENCOUNTER — Ambulatory Visit: Payer: Medicare Other | Admitting: Internal Medicine

## 2021-04-23 ENCOUNTER — Ambulatory Visit (INDEPENDENT_AMBULATORY_CARE_PROVIDER_SITE_OTHER): Payer: Medicare Other

## 2021-04-23 ENCOUNTER — Other Ambulatory Visit: Payer: Self-pay

## 2021-04-23 VITALS — BP 132/88 | HR 107 | Temp 97.5°F | Resp 16 | Ht 70.0 in | Wt 278.0 lb

## 2021-04-23 DIAGNOSIS — J3089 Other allergic rhinitis: Secondary | ICD-10-CM | POA: Diagnosis not present

## 2021-04-23 DIAGNOSIS — J449 Chronic obstructive pulmonary disease, unspecified: Secondary | ICD-10-CM

## 2021-04-23 DIAGNOSIS — K219 Gastro-esophageal reflux disease without esophagitis: Secondary | ICD-10-CM

## 2021-04-23 DIAGNOSIS — J4550 Severe persistent asthma, uncomplicated: Secondary | ICD-10-CM | POA: Diagnosis not present

## 2021-04-23 MED ORDER — ALBUTEROL SULFATE HFA 108 (90 BASE) MCG/ACT IN AERS: 1.0000 | INHALATION_SPRAY | Freq: Four times a day (QID) | RESPIRATORY_TRACT | 4 refills | Status: DC | PRN

## 2021-04-23 MED ORDER — TRELEGY ELLIPTA 200-62.5-25 MCG/ACT IN AEPB: INHALATION_SPRAY | RESPIRATORY_TRACT | 5 refills | Status: DC

## 2021-04-23 MED ORDER — IPRATROPIUM-ALBUTEROL 0.5-2.5 (3) MG/3ML IN SOLN: 3.0000 mL | Freq: Four times a day (QID) | RESPIRATORY_TRACT | 2 refills | Status: DC | PRN

## 2021-04-23 MED ORDER — LEVOCETIRIZINE DIHYDROCHLORIDE 5 MG PO TABS: 5.0000 mg | ORAL_TABLET | Freq: Every evening | ORAL | 1 refills | Status: DC

## 2021-04-23 MED ORDER — MONTELUKAST SODIUM 10 MG PO TABS: 10.0000 mg | ORAL_TABLET | Freq: Every day | ORAL | 1 refills | Status: DC

## 2021-04-23 MED ORDER — ALBUTEROL SULFATE (2.5 MG/3ML) 0.083% IN NEBU: 2.5000 mg | INHALATION_SOLUTION | Freq: Four times a day (QID) | RESPIRATORY_TRACT | 1 refills | Status: DC | PRN

## 2021-04-23 NOTE — Patient Instructions (Addendum)
1. Severe persistent asthma/COPD overlap with exacerbation - Lung testing looked bad but did get significantly better with albuterol - Prednisone taper x 5 days-if symptoms worsen, reach out to Pulmonology for consideration of antibiotics for potential COPD flare - Daily controller medication(s): Continue Trelegy 1 puff twice a day (sample provided) + continue Singulair 38m daily + Fasenra every 8 weeks - Prior to physical activity: albuterol 2 puffs 10-15 minutes before physical activity. - Rescue medications: albuterol 4 puffs every 4-6 hours as needed, albuterol nebulizer one vial every 4-6 hours as needed, or DuoNeb nebulizer one vial every 4-6 hours as needed - Asthma control goals:  * Full participation in all desired activities (may need albuterol before activity) * Albuterol use two time or less a week on average (not counting use with activity) * Cough interfering with sleep two time or less a month * Oral steroids no more than once a year * No hospitalizations - continue follow-up with Pulmonology as scheduled  2. Chronic rhinitis (dust mites, molds) - Continue with Flonase one spray per nostril twice daily to  - Continue with Xyzal (levocetirizine) 531mdaily to control any postnasal drip.   3. Gastroesophageal reflux disease  - Continue Protonix 4020mnce daily to control any silent reflux.  4. Follow-up in 2 months.    It was a pleasure meeting you in clinic today! If you receive a survey about today's experience, please consider giving us [place]edback on how we're doing!  EriSigurd SosD [facility]

## 2021-04-23 NOTE — Addendum Note (Signed)
Addended by: Clemon Chambers on: 04/23/2021 09:05 PM   Modules accepted: Orders

## 2021-04-23 NOTE — Progress Notes (Addendum)
FOLLOW UP Date of Service/Encounter:  04/23/21   Subjective:  Patty Howard (DOB: 12-26-1955) is a 65 y.o. female who returns to the [facility] [place] on 04/23/2021 in re-evaluation of the following: asthma/COPD, allergic rhinitis, GERD History obtained from: chart review and patient.  For Review, LV was on 07/23/20  with Dr. Ernst Bowler seen for asthma/COPD overlap with spirometry FEV1s in 86s (on Pulmicort BID, perforomist BID, Singulair and Fasenra), perennial and seasonal allergic rhinitis (dust mites, molds-treated with flonase,Xyzal), GERD, diastolic heart failure on Lasix and obesity.  Previous diagnostics:  ARC: SPT + to molds and DM  She had a two-fold response to repeat step pneumo (following history of recurrent infections), normal quants (IgG 664), adequate protection to diptheria/tetanus. Normal CH50.  Today she presents for follow-up. She is using Trelegy as prescribed by her Pulmonologist. She is using Trelegy 1 puff daily.  She has lots of congestion and cough that is worse at night.  She is using her nebulizer machine at night which helps loosen her mucus.  She was given antibiotics 3 weeks ago by her Pulmonologist for a COPD flare. During the day, she has no problem-it's only at night.  No fevers, chills or other systemic symptoms.  She is using her nebulizer like 6 times per day (DuoNebs).  She was prescribed perforomist and pulmicort at last visit, but says these were not covered by her insurance. Otherwise her allergic rhinitis symptoms are controlled but she needs refills.   Allergies as of 04/23/2021       Reactions   Banana Hives, Itching        Medication List        Accurate as of April 23, 2021  5:08 PM. If you have any questions, ask your nurse or doctor.          STOP taking these medications    ipratropium-albuterol 0.5-2.5 (3) MG/3ML Soln Commonly known as: DUONEB Stopped by: Sigurd Sos, MD       TAKE these medications     albuterol 108 (90 Base) MCG/ACT inhaler Commonly known as: VENTOLIN HFA Inhale 1-2 puffs into the lungs every 6 (six) hours as needed for wheezing or shortness of breath.   albuterol (2.5 MG/3ML) 0.083% nebulizer solution Commonly known as: PROVENTIL Take 3 mLs (2.5 mg total) by nebulization every 6 (six) hours as needed for wheezing or shortness of breath.   amLODipine 5 MG tablet Commonly known as: NORVASC TAKE 1 TABLET (5 MG TOTAL) BY MOUTH DAILY.   amoxicillin-clavulanate 875-125 MG tablet Commonly known as: AUGMENTIN Take 1 tablet by mouth 2 (two) times daily.   ascorbic acid 500 MG tablet Commonly known as: VITAMIN C Take 500 mg by mouth daily.   aspirin 325 MG EC tablet Take 1 tablet (325 mg total) by mouth daily with breakfast. Must take at least 4 weeks postop for DVT prophylaxis   atorvastatin 10 MG tablet Commonly known as: LIPITOR Take 10 mg by mouth daily.   baclofen 10 MG tablet Commonly known as: LIORESAL Take 10 mg by mouth 3 (three) times daily.   benzonatate 200 MG capsule Commonly known as: TESSALON Take 1 capsule (200 mg total) by mouth 3 (three) times daily.   diazepam 5 MG tablet Commonly known as: Valium Take 0.5 tablets (2.5 mg total) by mouth every 6 (six) hours as needed for muscle spasms (spasms).   Diethylpropion HCl CR 75 MG Tb24 Take 75 mg by mouth every morning.   furosemide 40 MG tablet  Commonly known as: LASIX Take 1 tablet (40 mg total) by mouth daily as needed for fluid or edema (WEIGHT GAIN OF 2LBS, SHORTNESS OF BREATH). What changed: when to take this   HYDROcodone-acetaminophen 5-325 MG tablet Commonly known as: NORCO/VICODIN Take 1 tablet by mouth every 6 (six) hours as needed for moderate pain.   levocetirizine 5 MG tablet Commonly known as: XYZAL Take 1 tablet (5 mg total) by mouth every evening. What changed:  how much to take how to take this Changed by: Sigurd Sos, MD   levothyroxine 100 MCG tablet Commonly  known as: SYNTHROID Take 100 mcg by mouth daily before breakfast.   linaclotide 290 MCG Caps capsule Commonly known as: LINZESS Take 290 mcg by mouth daily before breakfast.   losartan-hydrochlorothiazide 100-25 MG tablet Commonly known as: HYZAAR Take 1 tablet by mouth daily.   Medi-Patch-Lidocaine 0.5-0.035-5-20 % Ptch Generic drug: Lido-Capsaicin-Men-Methyl Sal Apply 1 patch topically daily.   methocarbamol 500 MG tablet Commonly known as: Robaxin Take 1 tablet (500 mg total) by mouth every 8 (eight) hours as needed for muscle spasms.   methocarbamol 500 MG tablet Commonly known as: Robaxin Take 1 tablet (500 mg total) by mouth 4 (four) times daily.   montelukast 10 MG tablet Commonly known as: SINGULAIR Take 1 tablet (10 mg total) by mouth at bedtime. What changed: See the new instructions. Changed by: Sigurd Sos, MD   multivitamin with minerals Tabs tablet Take 1 tablet by mouth daily. Centrum Multivitamin   pantoprazole 40 MG tablet Commonly known as: PROTONIX TAKE 1 TABLET BY MOUTH EVERY DAY What changed:  when to take this reasons to take this   sodium chloride HYPERTONIC 3 % nebulizer solution Take by nebulization as needed for other or cough. Twice a day as needed for chest congestion   Trelegy Ellipta 200-62.5-25 MCG/ACT Aepb Generic drug: Fluticasone-Umeclidin-Vilant Inhale 1 puff into the lungs daily. What changed: Another medication with the same name was added. Make sure you understand how and when to take each. Changed by: Sigurd Sos, MD   Trelegy Ellipta 200-62.5-25 MCG/ACT Aepb Generic drug: Fluticasone-Umeclidin-Vilant Inhale 2 puffs twice daily. What changed: You were already taking a medication with the same name, and this prescription was added. Make sure you understand how and when to take each. Changed by: Sigurd Sos, MD   Vitamin D (Ergocalciferol) 1.25 MG (50000 UNIT) Caps capsule Commonly known as: DRISDOL Take 50,000 Units by  mouth every Monday.       Past Medical History:  Diagnosis Date   Anxiety    Arthritis    Asthma    COPD (chronic obstructive pulmonary disease) ([place])    Fracture    closed displaced left radial head   GERD (gastroesophageal reflux disease)    Hypertension    Sleep apnea    does not wear CPAP   Past Surgical History:  Procedure Laterality Date   BACK SURGERY     spinal stimulator   BREAST BIOPSY Right 2017   benign   COLONOSCOPY W/ BIOPSIES AND POLYPECTOMY     DILATION AND CURETTAGE OF UTERUS     LAPAROSCOPIC ROUX-EN-Y GASTRIC BYPASS WITH UPPER ENDOSCOPY AND REMOVAL OF LAP BAND     RADIAL HEAD ARTHROPLASTY Left 02/03/2019   Procedure: LEFT RADIAL HEAD ARTHROPLASTY;  Surgeon: [name], MD;  Location: [facility];  Service: Orthopedics;  Laterality: Left;  AXILLARY BLOCK VS BIER BLOCK   TOTAL HIP ARTHROPLASTY Left 01/22/2021   Procedure: LEFT TOTAL HIP ARTHROPLASTY  ANTERIOR APPROACH;  Surgeon: [name], MD;  Location: [facility];  Service: Orthopedics;  Laterality: Left;   TUBAL LIGATION     Otherwise, there have been no changes to her past medical history, surgical history, family history, or social history.  ROS: All others negative except as noted per HPI.   Objective:  BP 132/88   Pulse (!) 107   Temp (!) 97.5 F (36.4 C)   Resp 16   Ht 5' 10"  (1.778 m)   Wt 278 lb (126.1 kg)   SpO2 98%   BMI 39.89 kg/m  Body mass index is 39.89 kg/m. Physical Exam: General Appearance:  Alert, cooperative, no distress, appears stated age  Head:  Normocephalic, without obvious abnormality, atraumatic  Eyes:  Conjunctiva clear, EOM's intact  Nose: Nares normal, hypertrophic pink turbinates  Throat: Lips, tongue normal; teeth and gums normal, normal posterior oropharynx  Neck: Supple, symmetrical  Lungs:   Coarse wheezing throughout, Respirations unlabored, intermittent dry coughing  Heart:  RRR, no murmur, Appears well perfused  Extremities: No edema  Skin: Skin color,  texture, turgor normal, no rashes or lesions on visualized portions of skin  Neurologic: No gross deficits    Spirometry:  Tracings reviewed. Her effort: Good reproducible efforts. FVC: 1.45L (pre), 1.50L  (post) FEV1: 0.99L, 41% predicted (pre), 1.15L, 48% predicted (post) + 16 % change FEV1/FVC ratio: 87% (pre), 99% (post) Interpretation: Spirometry consistent with possible restrictive disease with partial bronchodilator response Please see scanned spirometry results for details.   Assessment/Plan   Patient Instructions  1. Severe persistent asthma/COPD overlap with exacerbation - Lung testing looked bad but did get significantly better with albuterol - Prednisone taper x 5 days-if symptoms worsen, reach out to Pulmonology for consideration of antibiotics for potential COPD flare - Daily controller medication(s): Continue Trelegy 200, 1 puff daily (sample provided) + continue Singulair 31m daily + Fasenra every 8 weeks - Prior to physical activity: albuterol 2 puffs 10-15 minutes before physical activity. - Rescue medications: albuterol 4 puffs every 4-6 hours as needed or albuterol nebulizer one vial every 4-6 hours as needed or DuoNeb nebulizer one vial every 4-6 hours as needed - Asthma control goals:  * Full participation in all desired activities (may need albuterol before activity) * Albuterol use two time or less a week on average (not counting use with activity) * Cough interfering with sleep two time or less a month * Oral steroids no more than once a year * No hospitalizations - continue follow-up with Pulmonology as scheduled  2. Chronic rhinitis (dust mites, molds) - Continue with Flonase one spray per nostril twice daily   - Continue with Xyzal (levocetirizine) 566mdaily to control any postnasal drip.   3. Gastroesophageal reflux disease  - Continue Protonix 4059mnce daily to control any silent reflux.  4. Follow-up in 2 months.    It was a pleasure meeting you  in clinic today! If you receive a survey about today's experience, please consider giving us Koreaedback on how we're doing!   EriSigurd SosD  [facility]

## 2021-04-24 ENCOUNTER — Other Ambulatory Visit: Payer: Self-pay | Admitting: Internal Medicine

## 2021-04-24 ENCOUNTER — Telehealth: Payer: Self-pay

## 2021-04-24 ENCOUNTER — Telehealth: Payer: Self-pay | Admitting: Gastroenterology

## 2021-04-24 ENCOUNTER — Ambulatory Visit: Payer: Medicare Other

## 2021-04-24 DIAGNOSIS — J449 Chronic obstructive pulmonary disease, unspecified: Secondary | ICD-10-CM

## 2021-04-24 MED ORDER — FLUTICASONE PROPIONATE HFA 110 MCG/ACT IN AERO: 2.0000 | INHALATION_SPRAY | Freq: Two times a day (BID) | RESPIRATORY_TRACT | 5 refills | Status: DC

## 2021-04-24 NOTE — Progress Notes (Signed)
I called Patty Howard to see how she is doing and she feels better but is still out of breath. We are going to add Flovent 110 2 puffs twice a day to be used during acute flares in addition to Trelegy.  She will use for at least one week or until her symptoms resolve.

## 2021-04-24 NOTE — Telephone Encounter (Signed)
Hey Dr. Silverio Decamp,   We received a transfer of care for colonoscopy. Patient had last procedure 2017 in [place]. We have obtained paper records and will send for review. Cas DOD (11/7 AM), can you review and advise on scheduling?  Thank you

## 2021-04-24 NOTE — Telephone Encounter (Signed)
PT called pt due to no-show appt.

## 2021-04-28 NOTE — Telephone Encounter (Signed)
Reviewed colonoscopy report , hyperplastic polyp. Due for recall colonoscopy in 2027. Please schedule office visit next available appointment if she has any GI issues or further questions. Thanks

## 2021-04-30 NOTE — Telephone Encounter (Signed)
Lft vm about recommendations and to return call if have any questions.

## 2021-05-01 ENCOUNTER — Encounter: Payer: Self-pay | Admitting: Gastroenterology

## 2021-05-01 NOTE — Telephone Encounter (Signed)
Scheduled patient for 06/19/21

## 2021-05-12 ENCOUNTER — Telehealth: Payer: Self-pay | Admitting: Internal Medicine

## 2021-05-12 NOTE — Telephone Encounter (Signed)
Called and left a voicemail asking for patient to return call to discuss.  °

## 2021-05-12 NOTE — Telephone Encounter (Signed)
Hi Ashleigh-  Is she using her Flovent 110, 2 puffs BID in addition to her Trelegy? She can do this for one week or until her symptoms resolve.  This will help with asthma (shortness of breath, cough, wheezing) if she feels her asthma has flared.  I saw her last time a few weeks ago, and we gave her a 5 day burst then. She also has COPD and the increased mucus could be a flare of COPD.  She should discuss with her Pulmonologist as the management for this is different than for asthma.  She can also use Mucinex [phone] mg BID for the next few days to help with mucus, but she should stay VERY hydrated if doing this.   Sincerely, Sigurd Sos, MD [facility]

## 2021-05-12 NOTE — Telephone Encounter (Signed)
Patient called back regarding her symptoms. She stated she has been using the Flovent inhaler along with the trelegy inhaler. She had also been using the Mucinex with no relief. She wants [place] to send in prednisone or a Z-pack. I went over the not by Dr.Phoenyx Melka and she states the only thing that will help is a z-pack or another 5 day pack of prednisone please advise.

## 2021-05-12 NOTE — Telephone Encounter (Signed)
Patient is requesting prednisone to be sent in for her. Patient states she saw the doctor about a week ago  and requested it then. Patient states her mucus is too white and thick, she would be fine with either prednisone or a z pack being sent in.   [facility] [place], [place] [address]  Best contact number: [phone]

## 2021-05-12 NOTE — Telephone Encounter (Signed)
Please advise 

## 2021-05-13 ENCOUNTER — Ambulatory Visit: Payer: BC Managed Care – PPO | Admitting: Pulmonary Disease

## 2021-05-13 ENCOUNTER — Ambulatory Visit: Payer: BC Managed Care – PPO | Admitting: Adult Health

## 2021-05-13 NOTE — Telephone Encounter (Signed)
I called the patient and left a detailed message for her to call back to schedule an appointment to be seen by Dr.Donie Lemelin to evaluate and treat her symptoms. Dr. Simona Huh is willing to add her to Wednesday at 10am or 3pm.

## 2021-05-22 ENCOUNTER — Other Ambulatory Visit (HOSPITAL_COMMUNITY): Payer: Self-pay

## 2021-05-27 ENCOUNTER — Ambulatory Visit: Payer: Medicare Other | Admitting: Allergy & Immunology

## 2021-05-30 ENCOUNTER — Other Ambulatory Visit (HOSPITAL_COMMUNITY): Payer: Self-pay

## 2021-05-30 ENCOUNTER — Telehealth: Payer: Self-pay | Admitting: Pharmacy Technician

## 2021-05-30 DIAGNOSIS — D509 Iron deficiency anemia, unspecified: Secondary | ICD-10-CM | POA: Insufficient documentation

## 2021-05-30 NOTE — Telephone Encounter (Signed)
Patient Advocate Encounter  Received notification from [facility] that RENEWAL prior authorization for Ipratropium-Albuterol 0.5-2.5MG is required.   PA submitted on 12.16.22 Key [id number] Status is pending   [facility] will continue to follow  Luciano Cutter, CPhT Patient Advocate Phone: [phone] Fax:  [phone]

## 2021-06-02 ENCOUNTER — Other Ambulatory Visit (HOSPITAL_COMMUNITY): Payer: Self-pay

## 2021-06-02 NOTE — Telephone Encounter (Signed)
No PA is needed for the Duoneb, it a $0 copay. The med. is covered under Med D.  The pharmacy has her name as Patty Howard, but Patty Howard has her name as Patty Howard.

## 2021-06-03 ENCOUNTER — Other Ambulatory Visit: Payer: Self-pay

## 2021-06-03 ENCOUNTER — Ambulatory Visit (INDEPENDENT_AMBULATORY_CARE_PROVIDER_SITE_OTHER): Payer: Medicare Other | Admitting: Allergy & Immunology

## 2021-06-03 VITALS — BP 138/88 | HR 119 | Temp 96.4°F | Resp 22 | Ht 70.0 in | Wt 284.8 lb

## 2021-06-03 DIAGNOSIS — J3089 Other allergic rhinitis: Secondary | ICD-10-CM | POA: Diagnosis not present

## 2021-06-03 DIAGNOSIS — B999 Unspecified infectious disease: Secondary | ICD-10-CM | POA: Diagnosis not present

## 2021-06-03 DIAGNOSIS — K219 Gastro-esophageal reflux disease without esophagitis: Secondary | ICD-10-CM

## 2021-06-03 DIAGNOSIS — J449 Chronic obstructive pulmonary disease, unspecified: Secondary | ICD-10-CM

## 2021-06-03 MED ORDER — FLUTICASONE PROPIONATE HFA 110 MCG/ACT IN AERO: 2.0000 | INHALATION_SPRAY | Freq: Two times a day (BID) | RESPIRATORY_TRACT | 5 refills | Status: DC

## 2021-06-03 MED ORDER — SUCRALFATE 1 G PO TABS: 1.0000 g | ORAL_TABLET | Freq: Three times a day (TID) | ORAL | 0 refills | Status: DC

## 2021-06-03 MED ORDER — ALBUTEROL SULFATE HFA 108 (90 BASE) MCG/ACT IN AERS: 1.0000 | INHALATION_SPRAY | Freq: Four times a day (QID) | RESPIRATORY_TRACT | 4 refills | Status: DC | PRN

## 2021-06-03 MED ORDER — FAMOTIDINE 40 MG PO TABS: 40.0000 mg | ORAL_TABLET | Freq: Two times a day (BID) | ORAL | 0 refills | Status: DC

## 2021-06-03 MED ORDER — BENZONATATE 100 MG PO CAPS: 100.0000 mg | ORAL_CAPSULE | Freq: Three times a day (TID) | ORAL | 0 refills | Status: DC | PRN

## 2021-06-03 NOTE — Patient Instructions (Addendum)
Cough - I ordered a chest X-ray to see what is going.  - Your lung testing looks great today [facility] BETTER) than last time. - I think your prednisone might have made your GERD worse which might have made your cough worse. - Take the Protonix EVERY day until the next visit.  - Add on Pepcid 78m twice daily revery day until the next visit. - This can work with the Protonix to help with the GERD, which again might be making your asthma worse.  - Add on carafate three times daily to help calm your stomach.  - Continue with Tessalon pearls to help with your coughing.   2. Return in about 4 weeks (around 07/01/2021).    Please inform uKoreaof any Emergency Department visits, hospitalizations, or changes in symptoms. Call uKoreabefore going to the ED for breathing or allergy symptoms since we might be able to fit you in for a sick visit. Feel free to contact uKoreaanytime with any questions, problems, or concerns.  It was a pleasure to see you again today!  Websites that have reliable patient information: 1. American Academy of Asthma, Allergy, and Immunology: www.aaaai.org 2. Food Allergy Research and Education (FARE): foodallergy.org 3. Mothers of Asthmatics: http://www.asthmacommunitynetwork.org 4. American College of Allergy, Asthma, and Immunology: www.acaai.org   COVID-19 Vaccine Information can be found at: [email or web address] questions related to vaccine distribution or appointments, please email [email or web address] or call [phone]   We realize that you might be concerned about having an allergic reaction to the COVID19 vaccines. To help with that concern, WE ARE OFFERING THE COVID19 VACCINES IN OUR OFFICE! Ask the front desk for dates!     Like u[place]on FNational Cityand Instagram for our latest updates!      A healthy democracy works best when ANew York Life Insuranceparticipate! Make sure you are registered to vote! If you have moved or  changed any of your contact information, you will need to get this updated before voting!  In some cases, you MAY be able to register to vote online: [email or web address]

## 2021-06-03 NOTE — Telephone Encounter (Signed)
Called the patient to follow up with her symptoms. She said she is still having issues with coughing. She was given prednisone and antibiotics on December 13th by her PCP. She stated she completed the antibiotics yesterday. She has been put on the schedule 06/03/21 with Dr. Ernst Bowler.

## 2021-06-03 NOTE — Telephone Encounter (Signed)
Thank you :)

## 2021-06-03 NOTE — Progress Notes (Signed)
FOLLOW UP  Date of Service/Encounter:  06/03/21   Assessment:   Asthma-COPD overlap syndrome - with spirometry improving since the last visit    Perennial and seasonal allergic rhinitis (dust mites, molds)   Gastroesophageal reflux disease   Diastolic heart failure - on Lasix    Morbid obesity   I did not give her additional steroids or antibiotics today.  I think some of her symptoms might be related to uncontrolled reflux.  While on the prednisone she has been on and her lackluster use of her PPI, this might have flared her reflux but it resulted in worsening bronchospasm.  We are going to add Carafate 3 times daily for a couple weeks and add famotidine 40 mg twice daily for a couple weeks.  I also encouraged her to take her pantoprazole daily until we see each other again.  Hopefully this will help control her cough.  Unfortunately, coughs can last for 4 to 6 weeks after viral infections, which I also told her.  Plan/Recommendations:   Cough - I ordered a chest X-ray to see what is going.  - Your lung testing looks great today [facility] BETTER) than last time. - I think your prednisone might have made your GERD worse which might have made your cough worse. - Take the Protonix EVERY day until the next visit.  - Add on Pepcid 10m twice daily revery day until the next visit. - This can work with the Protonix to help with the GERD, which again might be making your asthma worse.  - Add on carafate three times daily to help calm your stomach.  - Continue with Tessalon pearls to help with your coughing.   2. Return in about 4 weeks (around 07/01/2021).    Subjective:   Patty Howard a 65y.o. female presenting today for follow up of  Chief Complaint  Patient presents with   Asthma    Some improvement - still short of breath a lot. Patient states she is scheduled to do a breathing test w/pulmonologist on 07/07/21.   Cough    Since Thanksgiving -  - clear    Patty Grupphas a history of the following: Patient Active Problem List   Diagnosis Date Noted   History of total hip arthroplasty, left 03/07/2021   Trochanteric bursitis, left hip 12/23/2020   Tracheobronchomalacia 026/71/2458  Diastolic dysfunction 009/98/3382  Asthma exacerbation 11/13/2020   Menopause 11/08/2020   Influenza 10/10/2020   Medication management 04/30/2020   Gastroesophageal reflux disease 03/14/2020   OSA (obstructive sleep apnea) 02/15/2020   Abnormal findings on diagnostic imaging of lung 01/08/2020   Educated about COVID-19 virus infection 11/30/2019   Aortic atherosclerosis ([facility] 11/30/2019   Chest pain 11/26/2019   Acute asthma exacerbation 05/22/2019   Left radial head fracture 02/03/2019   Closed fracture of radial head 01/29/2019   Prediabetes 10/19/2018   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) ([place] 10/19/2018   Insomnia 10/19/2018   Elbow pain, chronic, left 10/19/2018   Dyspnea 10/12/2017   Pancolitis [place] 05/20/2017   H/O Spinal surgery 01/18/2015   Essential hypertension 01/18/2015   Obesity 01/18/2015    History obtained from: chart review and patient.  Patty Howard a 65y.o. female presenting for a follow up visit.  He was last seen on November 2022 by Dr. DSimona Huhfor a sick visit.  At that time, testing looked terrible and improved with albuterol.  She was given a prednisone taper for 5  days.  She was continued on Trelegy 200 mcg 1 puff once daily as well as Singulair daily and Fasenra every 8 weeks.  For her rhinitis, she was continued on Flonase as well as levocetirizine.  Her GERD was under control with 40 mg daily.  Two weeks after this, around November 28th, she called requesting additional prednisone.  Dr. Simona Huh received the message.  She recommended starting Mucinex twice daily and hydration.  She also reminded her that she could have Flovent 2 puffs twice daily.  Dr. Simona Huh also recommended that she talk to her  pulmonologist as this might be a COPD exacerbation.  The patient continue to push her prednisone or azithromycin.  Evidently, when she could not get the prednisone and azithromycin from [place], she called her PCP who gave it to her.  She just finished the prednisone and antibiotics on December 13.  In the interim, she has continued to have shortness of breath.  Today, she reports to me that she needs some more prednisone and antibiotics.  I pointed out to her that her spirometry actually looks better than when she saw Dr. Simona Huh in early November.  She has talked to her pulmonologist recently, Dr. Ander Slade, they schedule her for a pulmonary function test on January 23.  He did not get her prednisone either.  Asthma/Respiratory Symptom History: She remains on her Trelegy 1 puff once daily.  She is also on Fasenra and has been compliant with her injections.  Since October, she has been using her albuterol 2-3 times a day. She does report that the albuterol helps.  She also reports that the prednisone has helped on the 2 occasions that she has not noticed in the last 6 weeks.  Allergic Rhinitis Symptom History: She remains on Flonase as well as Xyzal.  GERD Symptom History: She does have Protonix for her reflux, but upon further discussion states she does not use this every day.  She does not remember the last time she has used it.  More than a couple of weeks ago.  She has only use it when she feels classic reflux symptoms does not really use it as a preventative. She is not on famotidine.  Otherwise, there have been no changes to her past medical history, surgical history, family history, or social history.    Review of Systems  Constitutional: Negative.  Negative for fever, malaise/fatigue and weight loss.  HENT: Negative.  Negative for congestion, ear discharge, ear pain and sinus pain.   Eyes:  Negative for pain, discharge and redness.  Respiratory:  Positive for cough and shortness of breath.  Negative for sputum production and wheezing.   Cardiovascular: Negative.  Negative for chest pain and palpitations.  Gastrointestinal:  Negative for abdominal pain, constipation, diarrhea, heartburn, nausea and vomiting.  Skin: Negative.  Negative for itching and rash.  Neurological:  Negative for dizziness and headaches.  Endo/Heme/Allergies:  Negative for environmental allergies. Does not bruise/bleed easily.      Objective:   Blood pressure 138/88, pulse (!) 119, temperature (!) 96.4 F (35.8 C), resp. rate (!) 22, height 5' 10"  (1.778 m), weight 284 lb 12.8 oz (129.2 kg), SpO2 99 %. Body mass index is 40.86 kg/m.   Physical Exam:  Physical Exam Vitals reviewed.  Constitutional:      Appearance: She is well-developed.     Comments: Speaking in full sentences without a problem.  HENT:     Head: Normocephalic and atraumatic.     Right Ear: Tympanic membrane,  ear canal and external ear normal.     Left Ear: Tympanic membrane, ear canal and external ear normal.     Nose: Rhinorrhea present. No nasal deformity, septal deviation or mucosal edema.     Right Turbinates: Enlarged and swollen.     Left Turbinates: Enlarged.     Right Sinus: No maxillary sinus tenderness or frontal sinus tenderness.     Left Sinus: No maxillary sinus tenderness or frontal sinus tenderness.     Mouth/Throat:     Mouth: Mucous membranes are not pale and not dry.     Pharynx: Uvula midline.  Eyes:     General: Lids are normal. No allergic shiner.       Right eye: No discharge.        Left eye: No discharge.     Conjunctiva/sclera: Conjunctivae normal.     Right eye: Right conjunctiva is not injected. No chemosis.    Left eye: Left conjunctiva is not injected. No chemosis.    Pupils: Pupils are equal, round, and reactive to light.  Cardiovascular:     Rate and Rhythm: Normal rate and regular rhythm.     Heart sounds: Normal heart sounds.  Pulmonary:     Effort: Pulmonary effort is normal. No  tachypnea, accessory muscle usage or respiratory distress.     Breath sounds: Examination of the right-upper field reveals wheezing. Examination of the right-middle field reveals wheezing. Examination of the right-lower field reveals wheezing. Wheezing present. No rhonchi or rales.     Comments: Moving air well in all lung fields.  No increased work of breathing.  Might be some very faint end expiratory wheezes on the right side of her lungs. Chest:     Chest wall: No tenderness.  Lymphadenopathy:     Cervical: No cervical adenopathy.  Skin:    General: Skin is warm.     Capillary Refill: Capillary refill takes less than 2 seconds.     Coloration: Skin is not pale.     Findings: No abrasion, erythema, petechiae or rash. Rash is not papular, urticarial or vesicular.  Neurological:     Mental Status: She is alert.  Psychiatric:        Behavior: Behavior is cooperative.     Diagnostic studies:   Spirometry: results abnormal (FEV1: 1.05/44%, FVC: 1.77/57%, FEV1/FVC: 59%).    Spirometry consistent with mixed obstructive and restrictive disease.  Overall, her values are better than when the have seen her in the past.    Allergy Studies: none     Salvatore Marvel, MD  [facility] [place] [facility] [place]

## 2021-06-03 NOTE — Telephone Encounter (Signed)
There is spirometry look much better today.  I aggressively treated coexisting reflux, as she has been on prednisone for quite some time without routine use of her proton pump inhibitor.  I wonder if this is what is causing her cough to worsen.  I am getting a chest x-ray as well.  I did not add more prednisone or antibiotics.  I was not sure what I would be treating.  Salvatore Marvel, MD [facility] [place] [facility] [place]

## 2021-06-04 ENCOUNTER — Encounter: Payer: Self-pay | Admitting: Allergy & Immunology

## 2021-06-04 ENCOUNTER — Ambulatory Visit
Admission: RE | Admit: 2021-06-04 | Discharge: 2021-06-04 | Disposition: A | Discharge status: Home / Self Care | Payer: BC Managed Care – PPO | Source: Ambulatory Visit | Attending: Allergy & Immunology | Admitting: Allergy & Immunology

## 2021-06-04 DIAGNOSIS — J449 Chronic obstructive pulmonary disease, unspecified: Secondary | ICD-10-CM

## 2021-06-04 MED ORDER — BENZONATATE 100 MG PO CAPS: 100.0000 mg | ORAL_CAPSULE | Freq: Three times a day (TID) | ORAL | 0 refills | Status: AC | PRN

## 2021-06-04 NOTE — Telephone Encounter (Signed)
Thanks

## 2021-06-19 ENCOUNTER — Ambulatory Visit: Payer: BC Managed Care – PPO | Admitting: Gastroenterology

## 2021-06-23 ENCOUNTER — Encounter: Payer: Self-pay | Admitting: Family Medicine

## 2021-06-24 ENCOUNTER — Ambulatory Visit: Payer: Medicare Other

## 2021-06-30 ENCOUNTER — Ambulatory Visit (INDEPENDENT_AMBULATORY_CARE_PROVIDER_SITE_OTHER): Payer: PRIVATE HEALTH INSURANCE | Admitting: Adult Health

## 2021-06-30 ENCOUNTER — Ambulatory Visit (INDEPENDENT_AMBULATORY_CARE_PROVIDER_SITE_OTHER): Payer: PRIVATE HEALTH INSURANCE | Admitting: Pulmonary Disease

## 2021-06-30 ENCOUNTER — Encounter: Payer: Self-pay | Admitting: Adult Health

## 2021-06-30 ENCOUNTER — Other Ambulatory Visit: Payer: Self-pay

## 2021-06-30 DIAGNOSIS — J4550 Severe persistent asthma, uncomplicated: Secondary | ICD-10-CM | POA: Diagnosis not present

## 2021-06-30 DIAGNOSIS — J4551 Severe persistent asthma with (acute) exacerbation: Secondary | ICD-10-CM | POA: Diagnosis not present

## 2021-06-30 DIAGNOSIS — J441 Chronic obstructive pulmonary disease with (acute) exacerbation: Secondary | ICD-10-CM

## 2021-06-30 LAB — PULMONARY FUNCTION TEST
DL/VA % pred: 142 %
DL/VA: 5.77 ml/min/mmHg/L
DLCO cor % pred: 111 %
DLCO cor: 24.99 ml/min/mmHg
DLCO unc % pred: 111 %
DLCO unc: 24.99 ml/min/mmHg
FEF 25-75 Post: 0.94 L/sec
FEF 25-75 Pre: 0.76 L/sec
FEF2575-%Change-Post: 22 %
FEF2575-%Pred-Post: 44 %
FEF2575-%Pred-Pre: 36 %
FEV1-%Change-Post: 6 %
FEV1-%Pred-Post: 62 %
FEV1-%Pred-Pre: 58 %
FEV1-Post: 1.42 L
FEV1-Pre: 1.34 L
FEV1FVC-%Change-Post: 3 %
FEV1FVC-%Pred-Pre: 88 %
FEV6-%Change-Post: 5 %
FEV6-%Pred-Post: 70 %
FEV6-%Pred-Pre: 67 %
FEV6-Post: 2 L
FEV6-Pre: 1.9 L
FEV6FVC-%Change-Post: 0 %
FEV6FVC-%Pred-Post: 102 %
FEV6FVC-%Pred-Pre: 103 %
FVC-%Change-Post: 3 %
FVC-%Pred-Post: 68 %
FVC-%Pred-Pre: 66 %
FVC-Post: 2.01 L
FVC-Pre: 1.95 L
Post FEV1/FVC ratio: 71 %
Post FEV6/FVC ratio: 99 %
Pre FEV1/FVC ratio: 69 %
Pre FEV6/FVC Ratio: 100 %
RV % pred: 117 %
RV: 2.74 L
TLC % pred: 93 %
TLC: 5.27 L

## 2021-06-30 MED ORDER — DOXYCYCLINE HYCLATE 100 MG PO TABS: 100.0000 mg | ORAL_TABLET | Freq: Two times a day (BID) | ORAL | 0 refills | Status: DC

## 2021-06-30 MED ORDER — PREDNISONE 20 MG PO TABS: 20.0000 mg | ORAL_TABLET | Freq: Every day | ORAL | 0 refills | Status: DC

## 2021-06-30 MED ORDER — BENZONATATE 200 MG PO CAPS: 200.0000 mg | ORAL_CAPSULE | Freq: Three times a day (TID) | ORAL | 1 refills | Status: DC | PRN

## 2021-06-30 NOTE — Progress Notes (Signed)
@Patient  ID: Patty Howard, female    DOB: 1956-02-13, 66 y.o.   MRN: [id number]  Chief Complaint  Patient presents with   Follow-up    Referring provider: Buzzy Han*  HPI: 66 year old female followed for obstructive sleep apnea and asthma  TEST/EVENTS :   06/30/2021 Follow up : Asthma  Patient returns for a 82-monthfollow-up.  Patient complains that she has been having ongoing cough and wheezing over the last year.  Says that [place] last [place] in [place]  She was treated for an asthma exacerbation.  She received a prednisone taper.  She says she is feeling much better with decreased wheezing.  Still has some lingering cough that is mainly productive with clear thick mucus.  She is had no associated fever.  COVID testing was negative.  She was told her chest x-ray was normal. Patient remains on Trelegy inhaler daily.  She is followed by an allergist.  On FBerna Bueevery 2 months.  She also takes Xyzal and Flonase daily. Chest x-ray last month on June 04, 2021 showed clear lungs. Previous high-resolution CT chest March 2022 showed no evidence of interstitial lung disease.  There was some mild postinfectious/inflammatory scarring in the lungs.  And some small scattered pulmonary nodules maximum size 4 mm. No calf pain , no swelling , no hemotpsys , no hrt , no chest pain , no syncope  PFTs show moderate obstruction with an FEV1 at 58%, ratio 69, FVC 66%, no significant bronchodilator response, DLCO 111%.   SH:  Retired pEngineer, structuralNo PHydrographic surveyor.  No hot tub or basement .  No unusual hobbies  Lives alone .  Drives independent.  No birds and chickens        Allergies  Allergen Reactions   Banana Hives and Itching    Immunization History  Administered Date(s) Administered   Fluad Quad(high Dose 65+) 03/12/2021   Influenza, High Dose Seasonal PF 03/06/2019, 03/13/2019   Influenza,inj,Quad PF,6+ Mos 04/07/2018    Influenza-Unspecified 03/29/2017, 03/05/2020   Moderna SARS-COV2 Booster Vaccination 05/03/2020   Moderna Sars-Covid-2 Vaccination 08/19/2019, 09/14/2019   Pneumococcal Polysaccharide-23 03/29/2017   Tdap 10/30/2017   Zoster Recombinat (Shingrix) 03/30/2019    Past Medical History:  Diagnosis Date   Anxiety    Arthritis    Asthma    COPD (chronic obstructive pulmonary disease) (HCC)    Fracture    closed displaced left radial head   GERD (gastroesophageal reflux disease)    Hypertension    Sleep apnea    does not wear CPAP    Tobacco History: Social History   Tobacco Use  Smoking Status Former   Packs/day: 0.50   Years: 10.00   Pack years: 5.00   Types: Cigarettes   Quit date: 06/15/1998   Years since quitting: 23.0  Smokeless Tobacco Never   Counseling given: Not Answered   Outpatient Medications Prior to Visit  Medication Sig Dispense Refill   albuterol (PROVENTIL) (2.5 MG/3ML) 0.083% nebulizer solution Take 3 mLs (2.5 mg total) by nebulization every 6 (six) hours as needed for wheezing or shortness of breath. 75 mL 1   albuterol (VENTOLIN HFA) 108 (90 Base) MCG/ACT inhaler Inhale 1-2 puffs into the lungs every 6 (six) hours as needed for wheezing or shortness of breath. 8 g 4   ascorbic acid (VITAMIN C) 500 MG tablet Take 500 mg by mouth daily.     atorvastatin (LIPITOR) 10 MG tablet Take 10 mg by mouth daily.  baclofen (LIORESAL) 10 MG tablet Take 10 mg by mouth 3 (three) times daily.     diazepam (VALIUM) 5 MG tablet Take 0.5 tablets (2.5 mg total) by mouth every 6 (six) hours as needed for muscle spasms (spasms). 10 tablet 0   Diethylpropion HCl CR 75 MG TB24 Take 75 mg by mouth every morning.     fluticasone (FLOVENT HFA) 110 MCG/ACT inhaler Inhale 2 puffs into the lungs 2 (two) times daily. 1 each 5   Fluticasone-Umeclidin-Vilant (TRELEGY ELLIPTA) 200-62.5-25 MCG/ACT AEPB Inhale 1 puff daily. 28 each 5   Fluticasone-Umeclidin-Vilant (TRELEGY ELLIPTA)  200-62.5-25 MCG/INH AEPB Inhale 1 puff into the lungs daily. 30 each 5   ipratropium-albuterol (DUONEB) 0.5-2.5 (3) MG/3ML SOLN Take 3 mLs by nebulization every 6 (six) hours as needed. 150 mL 2   levocetirizine (XYZAL) 5 MG tablet Take 1 tablet (5 mg total) by mouth every evening. 90 tablet 1   levothyroxine (SYNTHROID) 100 MCG tablet Take 100 mcg by mouth daily before breakfast.     Lido-Capsaicin-Men-Methyl Sal (MEDI-PATCH-LIDOCAINE) 0.5-0.035-5-20 % PTCH Apply 1 patch topically daily. 30 patch 0   losartan-hydrochlorothiazide (HYZAAR) 100-25 MG tablet Take 1 tablet by mouth daily. 90 tablet 3   montelukast (SINGULAIR) 10 MG tablet Take 1 tablet (10 mg total) by mouth at bedtime. 90 tablet 1   Multiple Vitamin (MULTIVITAMIN WITH MINERALS) TABS tablet Take 1 tablet by mouth daily. Centrum Multivitamin     pantoprazole (PROTONIX) 40 MG tablet TAKE 1 TABLET BY MOUTH EVERY DAY (Patient taking differently: Take 40 mg by mouth daily as needed (acid reflux).) 90 tablet 1   Vitamin D, Ergocalciferol, (DRISDOL) 1.25 MG (50000 UNIT) CAPS capsule Take 50,000 Units by mouth every Monday.      amLODipine (NORVASC) 5 MG tablet TAKE 1 TABLET (5 MG TOTAL) BY MOUTH DAILY. 90 tablet 3   famotidine (PEPCID) 40 MG tablet Take 1 tablet (40 mg total) by mouth 2 (two) times daily for 14 days. 28 tablet 0   furosemide (LASIX) 40 MG tablet Take 1 tablet (40 mg total) by mouth daily as needed for fluid or edema (WEIGHT GAIN OF 2LBS, SHORTNESS OF BREATH). (Patient taking differently: Take 40 mg by mouth daily.) 30 tablet 3   sucralfate (CARAFATE) 1 g tablet Take 1 tablet (1 g total) by mouth 4 (four) times daily -  with meals and at bedtime for 14 days. 56 tablet 0   Facility-Administered Medications Prior to Visit  Medication Dose Route Frequency Provider Last Rate Last Admin   Benralizumab SOSY 30 mg  30 mg Subcutaneous Q8 Toney Reil, MD   30 mg at 04/23/21 1500     Review of Systems:    Constitutional:   No  weight loss, night sweats,  Fevers, chills,  +fatigue, or  lassitude.  HEENT:   No headaches,  Difficulty swallowing,  Tooth/dental problems, or  Sore throat,                No sneezing, itching, ear ache, nasal congestion, post nasal drip,   CV:  No chest pain,  Orthopnea, PND, swelling in lower extremities, anasarca, dizziness, palpitations, syncope.   GI  No heartburn, indigestion, abdominal pain, nausea, vomiting, diarrhea, change in bowel habits, loss of appetite, bloody stools.   Resp: .  No chest wall deformity  Skin: no rash or lesions.  GU: no dysuria, change in color of urine, no urgency or frequency.  No flank pain, no hematuria   MS:  No joint pain or swelling.  No decreased range of motion.  No back pain.    Physical Exam  BP 120/70 (BP Location: Left Arm, Patient Position: Sitting, Cuff Size: Large)    Pulse (!) 101    Temp 98.2 F (36.8 C) (Oral)    Ht 5' 8"  (1.727 m)    Wt 291 lb 3.2 oz (132.1 kg)    SpO2 96%    BMI 44.28 kg/m   GEN: A/Ox3; pleasant , NAD, well nourished    HEENT:  [place]/AT, NOSE-clear, THROAT-clear, no lesions, no postnasal drip or exudate noted.   NECK:  Supple w/ fair ROM; no JVD; normal carotid impulses w/o bruits; no thyromegaly or nodules palpated; no lymphadenopathy.    RESP  scattered rhonchi bilaterally , speaks in full sentences   no accessory muscle use, no dullness to percussion  CARD:  RRR, no m/r/g, no peripheral edema, pulses intact, no cyanosis or clubbing.  GI:   Soft & nt; nml bowel sounds; no organomegaly or masses detected.   Musco: Warm bil, no deformities or joint swelling noted.   Neuro: alert, no focal deficits noted.    Skin: Warm, no lesions or rashes    Lab Results:    BMET   BNP   ProBNP No results found for: PROBNP  Imaging: DG Chest 2 View  Result Date: 06/04/2021 CLINICAL DATA:  Persistent cough and shortness of breath. EXAM: CHEST - 2 VIEW COMPARISON:  Chest x-ray  03/26/2021 FINDINGS: The heart size and mediastinal contours are within normal limits. Both lungs are clear. The visualized skeletal structures are unremarkable. IMPRESSION: No active cardiopulmonary disease. Electronically Signed   By: Ronney Asters M.D.   On: 06/04/2021 22:09      PFT Results Latest Ref Rng & Units 06/30/2021  FVC-Pre L 1.95  FVC-Predicted Pre % 66  FVC-Post L 2.01  FVC-Predicted Post % 68  Pre FEV1/FVC % % 69  Post FEV1/FCV % % 71  FEV1-Pre L 1.34  FEV1-Predicted Pre % 58  FEV1-Post L 1.42  DLCO uncorrected ml/min/mmHg 24.99  DLCO UNC% % 111  DLCO corrected ml/min/mmHg 24.99  DLCO COR %Predicted % 111  DLVA Predicted % 142  TLC L 5.27  TLC % Predicted % 93  RV % Predicted % 117    No results found for: NITRICOXIDE      Assessment & Plan:   Asthma exacerbation Recurrent asthma exacerbation-patient appears to have a secondary bronchitic flare. Will treat with empiric antibiotics and steroids.  Patient is on maximum therapy with Trelegy and Fasenra. We will add in cough control regimen Continue on GERD treatment Continue on chronic rhinitis treatment  Plan  Patient Instructions  Doxycycline 161m Twice daily for 7 days - take with food.  Prednisone 22mdaily .  Continue on TRELEGY 1 puff daily ,rinse after use Continue on Singulair and Xyzal daily  Albuterol inhaler and neb As needed   Delsym 2 tsp Twice daily  for cough As needed   Tessalon Three times a day  for cough as needed  Follow up Dr. OlAnder Sladen 4-6 weeks and As needed   Please contact office for sooner follow up if symptoms do not improve or worsen or seek emergency care          TaRexene EdisonNP 06/30/2021

## 2021-06-30 NOTE — Progress Notes (Signed)
Full PFT completed today ? ?

## 2021-06-30 NOTE — Assessment & Plan Note (Signed)
Recurrent asthma exacerbation-patient appears to have a secondary bronchitic flare. Will treat with empiric antibiotics and steroids.  Patient is on maximum therapy with Trelegy and Fasenra. We will add in cough control regimen Continue on GERD treatment Continue on chronic rhinitis treatment  Plan  Patient Instructions  Doxycycline 157m Twice daily for 7 days - take with food.  Prednisone 271mdaily .  Continue on TRELEGY 1 puff daily ,rinse after use Continue on Singulair and Xyzal daily  Albuterol inhaler and neb As needed   Delsym 2 tsp Twice daily  for cough As needed   Tessalon Three times a day  for cough as needed  Follow up Dr. OlAnder Sladen 4-6 weeks and As needed   Please contact office for sooner follow up if symptoms do not improve or worsen or seek emergency care

## 2021-06-30 NOTE — Patient Instructions (Addendum)
Doxycycline 152m Twice daily for 7 days - take with food.  Prednisone 241mdaily .  Continue on TRELEGY 1 puff daily ,rinse after use Continue on Singulair and Xyzal daily  Albuterol inhaler and neb As needed   Delsym 2 tsp Twice daily  for cough As needed   Tessalon Three times a day  for cough as needed  Follow up Dr. OlAnder Sladen 4-6 weeks and As needed   Please contact office for sooner follow up if symptoms do not improve or worsen or seek emergency care

## 2021-07-02 ENCOUNTER — Other Ambulatory Visit: Payer: Self-pay

## 2021-07-02 ENCOUNTER — Ambulatory Visit (INDEPENDENT_AMBULATORY_CARE_PROVIDER_SITE_OTHER): Payer: Medicare Other

## 2021-07-02 DIAGNOSIS — J4550 Severe persistent asthma, uncomplicated: Secondary | ICD-10-CM | POA: Diagnosis not present

## 2021-07-04 ENCOUNTER — Telehealth: Payer: Self-pay

## 2021-07-04 NOTE — Telephone Encounter (Signed)
Pharmacy called about needing provider signature for prescription and for it to be faxed back to them. Dr. Ernst Bowler has the papers with him at this time.

## 2021-07-08 NOTE — Telephone Encounter (Signed)
I think I did this on Friday.  Salvatore Marvel, MD Allergy and [place] [facility] [place]

## 2021-07-09 NOTE — Telephone Encounter (Signed)
[facility] pharmacy [phone]. Called to request signed paperwork. Initial paperwork location is unknown. Dr. Ernst Bowler was informed and the pharmacy is faxing it again to [place] to be signed again.

## 2021-07-10 NOTE — Telephone Encounter (Signed)
Paperwork has been faxed to patients pharmacy with Dr. Bing Quarry signature.

## 2021-07-10 NOTE — Telephone Encounter (Signed)
I passed it to [facility] before I left the office yesterday evening.  Salvatore Marvel, MD Allergy and [place] [facility] [place]

## 2021-07-15 ENCOUNTER — Encounter: Payer: Self-pay | Admitting: Orthopaedic Surgery

## 2021-07-15 ENCOUNTER — Other Ambulatory Visit: Payer: Self-pay

## 2021-07-15 ENCOUNTER — Other Ambulatory Visit: Payer: Self-pay | Admitting: *Deleted

## 2021-07-15 ENCOUNTER — Ambulatory Visit: Payer: Self-pay

## 2021-07-15 ENCOUNTER — Ambulatory Visit (INDEPENDENT_AMBULATORY_CARE_PROVIDER_SITE_OTHER): Payer: Medicare Other | Admitting: Orthopaedic Surgery

## 2021-07-15 VITALS — BP 129/71 | HR 108

## 2021-07-15 DIAGNOSIS — Z96642 Presence of left artificial hip joint: Secondary | ICD-10-CM | POA: Diagnosis not present

## 2021-07-15 DIAGNOSIS — M51369 Other intervertebral disc degeneration, lumbar region without mention of lumbar back pain or lower extremity pain: Secondary | ICD-10-CM

## 2021-07-15 DIAGNOSIS — M25551 Pain in right hip: Secondary | ICD-10-CM

## 2021-07-15 DIAGNOSIS — M5136 Other intervertebral disc degeneration, lumbar region: Secondary | ICD-10-CM

## 2021-07-15 MED ORDER — FASENRA 30 MG/ML ~~LOC~~ SOSY: 30.0000 mg | PREFILLED_SYRINGE | SUBCUTANEOUS | 6 refills | Status: DC

## 2021-07-15 NOTE — Progress Notes (Signed)
Office Visit Note   Patient: Patty Howard           Date of Birth: 11-16-1955           MRN: [id number] Visit Date: 07/15/2021              Requested by: Buzzy Han, MD [place],  [place] [address] PCP: Buzzy Han, MD   Assessment & Plan: Visit Diagnoses:  1. Pain in right hip   2. History of total hip arthroplasty, left   3. Other intervertebral disc degeneration, lumbar region     Plan: Patient's had with a left total hip arthroplasty.  Right hip has some osteoarthritis not severe enough for total joint arthroplasty at this time.  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  Lumbar stenosis most significant at L4-5.  Recheck 6 months.  She will continue to work on weight loss and will check into water aerobics or pool therapy.  Follow-Up Instructions: Return in about 6 months (around 01/12/2022).   Orders:  Orders Placed This Encounter  Procedures   XR HIP UNILAT W OR W/O PELVIS 2-3 VIEWS RIGHT   No orders of the defined types were placed in this encounter.     Procedures: No procedures performed   Clinical Data: No additional findings.   Subjective: Chief Complaint  Patient presents with   Left Hip - Follow-up    HPI 66 year old female returns 5 months post left total hip arthroplasty.  Left hip doing well she is having some pain in her right hip with Trendelenburg gait and previous MRI showed gluteus minimus tear with severe fatty atrophy consistent with longstanding tear.  She also has some problems with pain in her back has some disc degeneration and some lumbar stenosis most significant at L4-5.  She states she has been working on weight loss.    Review of Systems.  Updated unchanged   Objective: Vital Signs: BP 129/71 (BP Location: Left Arm, Patient Position: Sitting, Cuff Size: Large)    Pulse (!) 108    SpO2 97%   Physical Exam Constitutional:      Appearance: She is well-developed.   HENT:     Head: Normocephalic.     Right Ear: External ear normal.     Left Ear: External ear normal. There is no impacted cerumen.  Eyes:     Pupils: Pupils are equal, round, and reactive to light.  Neck:     Thyroid: No thyromegaly.     Trachea: No tracheal deviation.  Cardiovascular:     Rate and Rhythm: Normal rate.  Pulmonary:     Effort: Pulmonary effort is normal.  Abdominal:     Palpations: Abdomen is soft.  Musculoskeletal:     Cervical back: No rigidity.  Skin:    General: Skin is warm and dry.  Neurological:     Mental Status: She is alert and oriented to person, place, and time.  Psychiatric:        Behavior: Behavior normal.    Ortho Exam patient has mild Trendelenburg gait right hip minimal discomfort with internal rotation right hip which internally rotates 30 to 40 degrees without restriction no hip flexion contracture.  Some tenderness over the sciatic notch right and left.  Anterior tib EHL is intact pulses are normal no pitting edema.  Specialty Comments:  No specialty comments available.  Imaging: No results found.   PMFS History: Patient Active Problem List   Diagnosis Date Noted  Other intervertebral disc degeneration, lumbar region 07/15/2021   History of total hip arthroplasty, left 03/07/2021   Trochanteric bursitis, left hip 12/23/2020   Tracheobronchomalacia 00/92/3300   Diastolic dysfunction 76/22/6333   Asthma exacerbation 11/13/2020   Menopause 11/08/2020   Influenza 10/10/2020   Medication management 04/30/2020   Gastroesophageal reflux disease 03/14/2020   OSA (obstructive sleep apnea) 02/15/2020   Abnormal findings on diagnostic imaging of lung 01/08/2020   Educated about COVID-19 virus infection 11/30/2019   Aortic atherosclerosis ([place]) 11/30/2019   Chest pain 11/26/2019   Acute asthma exacerbation 05/22/2019   Left radial head fracture 02/03/2019   Closed fracture of radial head 01/29/2019   Prediabetes 10/19/2018   Asthma  with COPD (chronic obstructive pulmonary disease) ([place]) 10/19/2018   Insomnia 10/19/2018   Elbow pain, chronic, left 10/19/2018   Dyspnea 10/12/2017   Pancolitis ([place]) 05/20/2017   H/O Spinal surgery 01/18/2015   Essential hypertension 01/18/2015   Obesity 01/18/2015   Past Medical History:  Diagnosis Date   Anxiety    Arthritis    Asthma    COPD (chronic obstructive pulmonary disease) ([place])    Fracture    closed displaced left radial head   GERD (gastroesophageal reflux disease)    Hypertension    Sleep apnea    does not wear CPAP    Family History  Problem Relation Age of Onset   Hypertension Mother    Stroke Mother    Cancer Father     Past Surgical History:  Procedure Laterality Date   BACK SURGERY     spinal stimulator   BREAST BIOPSY Right 2017   benign   COLONOSCOPY W/ BIOPSIES AND POLYPECTOMY     DILATION AND CURETTAGE OF UTERUS     LAPAROSCOPIC ROUX-EN-Y GASTRIC BYPASS WITH UPPER ENDOSCOPY AND REMOVAL OF LAP BAND     RADIAL HEAD ARTHROPLASTY Left 02/03/2019   Procedure: LEFT RADIAL HEAD ARTHROPLASTY;  Surgeon: [name], MD;  Location: [facility];  Service: Orthopedics;  Laterality: Left;  AXILLARY BLOCK VS BIER BLOCK   TOTAL HIP ARTHROPLASTY Left 01/22/2021   Procedure: LEFT TOTAL HIP ARTHROPLASTY ANTERIOR APPROACH;  Surgeon: [name], MD;  Location: [facility];  Service: Orthopedics;  Laterality: Left;   TUBAL LIGATION     Social History   Occupational History   Not on file  Tobacco Use   Smoking status: Former    Packs/day: 0.50    Years: 10.00    Pack years: 5.00    Types: Cigarettes    Quit date: 06/15/1998    Years since quitting: 23.0   Smokeless tobacco: Never  Vaping Use   Vaping Use: Never used  Substance and Sexual Activity   Alcohol use: Never   Drug use: Never   Sexual activity: Yes

## 2021-07-21 ENCOUNTER — Ambulatory Visit
Admission: RE | Admit: 2021-07-21 | Discharge: 2021-07-21 | Disposition: A | Discharge status: Home / Self Care | Payer: Medicare Other | Source: Ambulatory Visit | Attending: Family Medicine | Admitting: Family Medicine

## 2021-07-21 ENCOUNTER — Other Ambulatory Visit: Payer: Self-pay | Admitting: Family Medicine

## 2021-07-21 DIAGNOSIS — J441 Chronic obstructive pulmonary disease with (acute) exacerbation: Secondary | ICD-10-CM

## 2021-07-21 DIAGNOSIS — R059 Cough, unspecified: Secondary | ICD-10-CM

## 2021-07-21 DIAGNOSIS — J45901 Unspecified asthma with (acute) exacerbation: Secondary | ICD-10-CM

## 2021-07-22 ENCOUNTER — Ambulatory Visit: Payer: BC Managed Care – PPO | Admitting: Orthopaedic Surgery

## 2021-07-30 ENCOUNTER — Ambulatory Visit: Payer: Medicare Other

## 2021-08-04 ENCOUNTER — Ambulatory Visit (INDEPENDENT_AMBULATORY_CARE_PROVIDER_SITE_OTHER): Payer: Medicare Other

## 2021-08-04 ENCOUNTER — Other Ambulatory Visit: Payer: Self-pay

## 2021-08-04 DIAGNOSIS — J4550 Severe persistent asthma, uncomplicated: Secondary | ICD-10-CM | POA: Diagnosis not present

## 2021-08-11 ENCOUNTER — Telehealth: Payer: Self-pay | Admitting: Orthopaedic Surgery

## 2021-08-11 NOTE — Telephone Encounter (Signed)
Please advise 

## 2021-08-11 NOTE — Telephone Encounter (Signed)
Pt called requesting a referral for pain management in [place]. Please call pt about this matter at [phone].

## 2021-08-12 ENCOUNTER — Other Ambulatory Visit: Payer: Self-pay | Admitting: Orthopaedic Surgery

## 2021-08-12 MED ORDER — PREDNISONE 5 MG (21) PO TBPK: ORAL_TABLET | ORAL | 0 refills | Status: DC

## 2021-08-12 NOTE — Progress Notes (Signed)
Hip replacement doing well.  She is having back pain radiates into her legs had marked stenosis on MRI May 2022.  Prednisone 5 mg Dosepak sent in.  Pain has been worse for the last week she is having difficulty walking.

## 2021-08-25 ENCOUNTER — Ambulatory Visit: Payer: BC Managed Care – PPO | Admitting: Pulmonary Disease

## 2021-09-16 ENCOUNTER — Other Ambulatory Visit: Payer: Self-pay | Admitting: Internal Medicine

## 2021-09-16 DIAGNOSIS — Z1231 Encounter for screening mammogram for malignant neoplasm of breast: Secondary | ICD-10-CM

## 2021-09-22 ENCOUNTER — Ambulatory Visit: Payer: BC Managed Care – PPO | Admitting: Nurse Practitioner

## 2021-09-22 NOTE — Progress Notes (Deleted)
? ? ?Office Visit  ?  ?Patient Name: Patty Howard ?Date of Encounter: 09/22/2021 ? ?Primary Care Provider:  Buzzy Han, MD ?Primary Cardiologist:  Minus Breeding, MD ? ?Chief Complaint  ?  ?66 year old female with a history of atypical chest pain, shortness of breath, hypertension, OSA, COPD, GERD, and anxiety who presents for follow-up related to hypertension. ? ?Past Medical History  ?  ?Past Medical History:  ?Diagnosis Date  ? Anxiety   ? Arthritis   ? Asthma   ? COPD (chronic obstructive pulmonary disease) ([place])   ? Fracture   ? closed displaced left radial head  ? GERD (gastroesophageal reflux disease)   ? Hypertension   ? Sleep apnea   ? does not wear CPAP  ? ?Past Surgical History:  ?Procedure Laterality Date  ? BACK SURGERY    ? spinal stimulator  ? BREAST BIOPSY Right 2017  ? benign  ? COLONOSCOPY W/ BIOPSIES AND POLYPECTOMY    ? DILATION AND CURETTAGE OF UTERUS    ? LAPAROSCOPIC ROUX-EN-Y GASTRIC BYPASS WITH UPPER ENDOSCOPY AND REMOVAL OF LAP BAND    ? RADIAL HEAD ARTHROPLASTY Left 02/03/2019  ? Procedure: LEFT RADIAL HEAD ARTHROPLASTY;  Surgeon: [name], MD;  Location: [facility];  Service: Orthopedics;  Laterality: Left;  AXILLARY BLOCK VS BIER BLOCK  ? TOTAL HIP ARTHROPLASTY Left 01/22/2021  ? Procedure: LEFT TOTAL HIP ARTHROPLASTY ANTERIOR APPROACH;  Surgeon: [name], MD;  Location: [facility];  Service: Orthopedics;  Laterality: Left;  ? TUBAL LIGATION    ? ? ?Allergies ? ?Allergies  ?Allergen Reactions  ? Banana Hives and Itching  ? ? ?History of Present Illness  ? ?66 year old female with the above past medical history including atypical chest pain, shortness of breath, hypertension, OSA, COPD, GERD, and anxiety. ? 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  She was hospitalized in June 2021 in the setting of shortness of breath, she was treated for diastolic heart failure.  At that time showed EF 65 to  70%, mild LVH, G1 DD. CT of the chest showed aortic atherosclerosis.  Lexiscan Myoview in July 2021 was negative.  She was treated with oral Lasix for possible acute diastolic heart failure.  Last seen in the office on 05/24/2020 and was stable from a cardiac standpoint.  She denies any new chest pain, she did report shortness of breath, however, this was thought to be mostly related to obesity and physical inactivity. Lasix was changed to as needed use.  Amplodipine was added for hypertension.  Additionally, she was referred to our health educator for management of obesity. ? ?She presents today for follow-up.  Since her last visit ? ?Atypical chest pain: ?Shortness of breath/grade 1 diastolic dysfunction: ?Hypertension: ?OSA: ?Obesity: ?Disposition: ? ? ?Home Medications  ?  ?Current Outpatient Medications  ?Medication Sig Dispense Refill  ? albuterol (PROVENTIL) (2.5 MG/3ML) 0.083% nebulizer solution Take 3 mLs (2.5 mg total) by nebulization every 6 (six) hours as needed for wheezing or shortness of breath. 75 mL 1  ? albuterol (VENTOLIN HFA) 108 (90 Base) MCG/ACT inhaler Inhale 1-2 puffs into the lungs every 6 (six) hours as needed for wheezing or shortness of breath. 8 g 4  ? amLODipine (NORVASC) 5 MG tablet TAKE 1 TABLET (5 MG TOTAL) BY MOUTH DAILY. 90 tablet 3  ? ascorbic acid (VITAMIN C) 500 MG tablet Take 500 mg by mouth daily.    ?  atorvastatin (LIPITOR) 10 MG tablet Take 10 mg by mouth daily.    ? baclofen (LIORESAL) 10 MG tablet Take 10 mg by mouth 3 (three) times daily.    ? Benralizumab (FASENRA) 30 MG/ML SOSY Inject 1 mL (30 mg total) into the skin every 8 (eight) weeks. 1 mL 6  ? benzonatate (TESSALON) 200 MG capsule Take 1 capsule (200 mg total) by mouth 3 (three) times daily as needed for cough. 30 capsule 1  ? diazepam (VALIUM) 5 MG tablet Take 0.5 tablets (2.5 mg total) by mouth every 6 (six) hours as needed for muscle spasms (spasms). 10 tablet 0  ? Diethylpropion HCl CR 75 MG TB24 Take 75 mg by  mouth every morning.    ? doxycycline (VIBRA-TABS) 100 MG tablet Take 1 tablet (100 mg total) by mouth 2 (two) times daily. 14 tablet 0  ? famotidine (PEPCID) 40 MG tablet Take 1 tablet (40 mg total) by mouth 2 (two) times daily for 14 days. 28 tablet 0  ? fluticasone (FLOVENT HFA) 110 MCG/ACT inhaler Inhale 2 puffs into the lungs 2 (two) times daily. 1 each 5  ? Fluticasone-Umeclidin-Vilant (TRELEGY ELLIPTA) 200-62.5-25 MCG/ACT AEPB Inhale 1 puff daily. 28 each 5  ? Fluticasone-Umeclidin-Vilant (TRELEGY ELLIPTA) 200-62.5-25 MCG/INH AEPB Inhale 1 puff into the lungs daily. 30 each 5  ? furosemide (LASIX) 40 MG tablet Take 1 tablet (40 mg total) by mouth daily as needed for fluid or edema (WEIGHT GAIN OF 2LBS, SHORTNESS OF BREATH). (Patient taking differently: Take 40 mg by mouth daily.) 30 tablet 3  ? ipratropium-albuterol (DUONEB) 0.5-2.5 (3) MG/3ML SOLN Take 3 mLs by nebulization every 6 (six) hours as needed. 150 mL 2  ? levocetirizine (XYZAL) 5 MG tablet Take 1 tablet (5 mg total) by mouth every evening. 90 tablet 1  ? levothyroxine (SYNTHROID) 100 MCG tablet Take 100 mcg by mouth daily before breakfast.    ? Lido-Capsaicin-Men-Methyl Sal (MEDI-PATCH-LIDOCAINE) 0.5-0.035-5-20 % PTCH Apply 1 patch topically daily. 30 patch 0  ? losartan-hydrochlorothiazide (HYZAAR) 100-25 MG tablet Take 1 tablet by mouth daily. 90 tablet 3  ? montelukast (SINGULAIR) 10 MG tablet Take 1 tablet (10 mg total) by mouth at bedtime. 90 tablet 1  ? Multiple Vitamin (MULTIVITAMIN WITH MINERALS) TABS tablet Take 1 tablet by mouth daily. Centrum Multivitamin    ? pantoprazole (PROTONIX) 40 MG tablet TAKE 1 TABLET BY MOUTH EVERY DAY (Patient taking differently: Take 40 mg by mouth daily as needed (acid reflux).) 90 tablet 1  ? predniSONE (DELTASONE) 20 MG tablet Take 1 tablet (20 mg total) by mouth daily with breakfast. 5 tablet 0  ? predniSONE (STERAPRED UNI-PAK 21 TAB) 5 MG (21) TBPK tablet Take one tablet less each day, 6,5,4,3,2,1 take  with food. 21 tablet 0  ? sucralfate (CARAFATE) 1 g tablet Take 1 tablet (1 g total) by mouth 4 (four) times daily -  with meals and at bedtime for 14 days. 56 tablet 0  ? Vitamin D, Ergocalciferol, (DRISDOL) 1.25 MG (50000 UNIT) CAPS capsule Take 50,000 Units by mouth every Monday.     ? ?Current Facility-Administered Medications  ?Medication Dose Route Frequency Provider Last Rate Last Admin  ? Benralizumab SOSY 30 mg  30 mg Subcutaneous Q8 Toney Reil, MD   30 mg at 08/04/21 1039  ?  ? ?Review of Systems  ?  ?***.  All other systems reviewed and are otherwise negative except as noted above. ?  ? ?Physical Exam  ?  ?VS:  There were no vitals taken for this visit. , BMI There is no height or weight on file to calculate BMI. ?    ?GEN: Well nourished, well developed, in no acute distress. ?HEENT: normal. ?Neck: Supple, no JVD, carotid bruits, or masses. ?Cardiac: RRR, no murmurs, rubs, or gallops. No clubbing, cyanosis, edema.  Radials/DP/PT 2+ and equal bilaterally.  ?Respiratory:  Respirations regular and unlabored, clear to auscultation bilaterally. ?GI: Soft, nontender, nondistended, BS + x 4. ?MS: no deformity or atrophy. ?Skin: warm and dry, no rash. ?Neuro:  Strength and sensation are intact. ?Psych: Normal affect. ? ?Accessory Clinical Findings  ?  ?ECG personally reviewed by me today - *** - no acute changes. ? ?Lab Results  ?Component Value Date  ? WBC 7.6 01/27/2021  ? HGB 9.5 (L) 01/27/2021  ? HCT 28.8 (L) 01/27/2021  ? MCV 81.4 01/27/2021  ? PLT 362 01/27/2021  ? ?Lab Results  ?Component Value Date  ? CREATININE 0.64 01/27/2021  ? BUN 9 01/27/2021  ? NA 136 01/27/2021  ? K 3.1 (L) 01/27/2021  ? CL 97 (L) 01/27/2021  ? CO2 27 01/27/2021  ? ?Lab Results  ?Component Value Date  ? ALT 21 01/16/2021  ? AST 18 01/16/2021  ? ALKPHOS 94 01/16/2021  ? BILITOT 0.7 01/16/2021  ? ?No results found for: CHOL, HDL, LDLCALC, LDLDIRECT, TRIG, CHOLHDL  ?Lab Results  ?Component Value Date  ? HGBA1C 6.7  (H) 11/26/2019  ? ? ?Assessment & Plan  ?  ?1.  *** ? ? ?Lenna Sciara, NP ?09/22/2021, 9:06 AM ?  ? ?

## 2021-09-27 ENCOUNTER — Other Ambulatory Visit: Payer: Self-pay

## 2021-09-27 ENCOUNTER — Emergency Department (HOSPITAL_BASED_OUTPATIENT_CLINIC_OR_DEPARTMENT_OTHER): Payer: Medicare Other

## 2021-09-27 ENCOUNTER — Inpatient Hospital Stay (HOSPITAL_BASED_OUTPATIENT_CLINIC_OR_DEPARTMENT_OTHER)
Admission: EM | Admit: 2021-09-27 | Discharge: 2021-09-30 | DRG: 191 | Disposition: A | Discharge status: Home / Self Care | Payer: Medicare Other | Attending: Internal Medicine | Admitting: Internal Medicine

## 2021-09-27 ENCOUNTER — Emergency Department (HOSPITAL_BASED_OUTPATIENT_CLINIC_OR_DEPARTMENT_OTHER): Payer: Medicare Other | Admitting: Radiology

## 2021-09-27 ENCOUNTER — Encounter (HOSPITAL_BASED_OUTPATIENT_CLINIC_OR_DEPARTMENT_OTHER): Payer: Self-pay | Admitting: *Deleted

## 2021-09-27 DIAGNOSIS — Z79890 Hormone replacement therapy (postmenopausal): Secondary | ICD-10-CM

## 2021-09-27 DIAGNOSIS — Z7952 Long term (current) use of systemic steroids: Secondary | ICD-10-CM | POA: Diagnosis not present

## 2021-09-27 DIAGNOSIS — I5032 Chronic diastolic (congestive) heart failure: Secondary | ICD-10-CM | POA: Diagnosis present

## 2021-09-27 DIAGNOSIS — R6520 Severe sepsis without septic shock: Secondary | ICD-10-CM | POA: Diagnosis present

## 2021-09-27 DIAGNOSIS — Z79620 Long term (current) use of immunosuppressive biologic: Secondary | ICD-10-CM

## 2021-09-27 DIAGNOSIS — A4189 Other specified sepsis: Principal | ICD-10-CM | POA: Diagnosis present

## 2021-09-27 DIAGNOSIS — J45901 Unspecified asthma with (acute) exacerbation: Secondary | ICD-10-CM | POA: Diagnosis not present

## 2021-09-27 DIAGNOSIS — Z9884 Bariatric surgery status: Secondary | ICD-10-CM | POA: Diagnosis not present

## 2021-09-27 DIAGNOSIS — Z20822 Contact with and (suspected) exposure to covid-19: Secondary | ICD-10-CM | POA: Diagnosis present

## 2021-09-27 DIAGNOSIS — J9601 Acute respiratory failure with hypoxia: Secondary | ICD-10-CM | POA: Diagnosis not present

## 2021-09-27 DIAGNOSIS — Z6841 Body Mass Index (BMI) 40.0 and over, adult: Secondary | ICD-10-CM | POA: Diagnosis not present

## 2021-09-27 DIAGNOSIS — Z8249 Family history of ischemic heart disease and other diseases of the circulatory system: Secondary | ICD-10-CM

## 2021-09-27 DIAGNOSIS — I110 Hypertensive heart disease with heart failure: Secondary | ICD-10-CM | POA: Diagnosis not present

## 2021-09-27 DIAGNOSIS — Z87891 Personal history of nicotine dependence: Secondary | ICD-10-CM | POA: Diagnosis not present

## 2021-09-27 DIAGNOSIS — A419 Sepsis, unspecified organism: Secondary | ICD-10-CM

## 2021-09-27 DIAGNOSIS — E876 Hypokalemia: Secondary | ICD-10-CM | POA: Diagnosis not present

## 2021-09-27 DIAGNOSIS — F419 Anxiety disorder, unspecified: Secondary | ICD-10-CM | POA: Diagnosis not present

## 2021-09-27 DIAGNOSIS — R0602 Shortness of breath: Secondary | ICD-10-CM | POA: Diagnosis present

## 2021-09-27 DIAGNOSIS — G4733 Obstructive sleep apnea (adult) (pediatric): Secondary | ICD-10-CM | POA: Diagnosis present

## 2021-09-27 DIAGNOSIS — Z7951 Long term (current) use of inhaled steroids: Secondary | ICD-10-CM

## 2021-09-27 DIAGNOSIS — Z91018 Allergy to other foods: Secondary | ICD-10-CM | POA: Diagnosis not present

## 2021-09-27 DIAGNOSIS — Z79899 Other long term (current) drug therapy: Secondary | ICD-10-CM

## 2021-09-27 DIAGNOSIS — M1990 Unspecified osteoarthritis, unspecified site: Secondary | ICD-10-CM | POA: Diagnosis present

## 2021-09-27 DIAGNOSIS — K219 Gastro-esophageal reflux disease without esophagitis: Secondary | ICD-10-CM | POA: Diagnosis not present

## 2021-09-27 DIAGNOSIS — J101 Influenza due to other identified influenza virus with other respiratory manifestations: Secondary | ICD-10-CM | POA: Diagnosis present

## 2021-09-27 DIAGNOSIS — J441 Chronic obstructive pulmonary disease with (acute) exacerbation: Principal | ICD-10-CM

## 2021-09-27 HISTORY — DX: Chronic obstructive pulmonary disease with (acute) exacerbation: J44.1

## 2021-09-27 LAB — RESP PANEL BY RT-PCR (FLU A&B, COVID) ARPGX2
Influenza A by PCR: POSITIVE — AB
Influenza B by PCR: NEGATIVE
SARS Coronavirus 2 by RT PCR: NEGATIVE

## 2021-09-27 LAB — CBC WITH DIFFERENTIAL/PLATELET
Abs Immature Granulocytes: 0.05 10*3/uL (ref 0.00–0.07)
Basophils Absolute: 0 10*3/uL (ref 0.0–0.1)
Basophils Relative: 0 %
Eosinophils Absolute: 0 10*3/uL (ref 0.0–0.5)
Eosinophils Relative: 0 %
HCT: 34.5 % — ABNORMAL LOW (ref 36.0–46.0)
Hemoglobin: 10.9 g/dL — ABNORMAL LOW (ref 12.0–15.0)
Immature Granulocytes: 0 %
Lymphocytes Relative: 10 %
Lymphs Abs: 1.4 10*3/uL (ref 0.7–4.0)
MCH: 24.9 pg — ABNORMAL LOW (ref 26.0–34.0)
MCHC: 31.6 g/dL (ref 30.0–36.0)
MCV: 78.9 fL — ABNORMAL LOW (ref 80.0–100.0)
Monocytes Absolute: 0.5 10*3/uL (ref 0.1–1.0)
Monocytes Relative: 4 %
Neutro Abs: 12.2 10*3/uL — ABNORMAL HIGH (ref 1.7–7.7)
Neutrophils Relative %: 86 %
Platelets: 324 10*3/uL (ref 150–400)
RBC: 4.37 MIL/uL (ref 3.87–5.11)
RDW: 16.5 % — ABNORMAL HIGH (ref 11.5–15.5)
WBC: 14.1 10*3/uL — ABNORMAL HIGH (ref 4.0–10.5)
nRBC: 0 % (ref 0.0–0.2)

## 2021-09-27 LAB — LACTIC ACID, PLASMA
Lactic Acid, Venous: 1.7 mmol/L (ref 0.5–1.9)
Lactic Acid, Venous: 1.6 mmol/L (ref 0.5–1.9)

## 2021-09-27 LAB — COMPREHENSIVE METABOLIC PANEL
ALT: 16 U/L (ref 0–44)
AST: 22 U/L (ref 15–41)
Albumin: 4.4 g/dL (ref 3.5–5.0)
Alkaline Phosphatase: 94 U/L (ref 38–126)
Anion gap: 9 (ref 5–15)
BUN: 11 mg/dL (ref 8–23)
CO2: 29 mmol/L (ref 22–32)
Calcium: 9.5 mg/dL (ref 8.9–10.3)
Chloride: 100 mmol/L (ref 98–111)
Creatinine, Ser: 0.64 mg/dL (ref 0.44–1.00)
GFR, Estimated: >60 mL/min (ref >60)
Glucose, Bld: 103 mg/dL — ABNORMAL HIGH (ref 70–99)
Potassium: 2.9 mmol/L — ABNORMAL LOW (ref 3.5–5.1)
Sodium: 138 mmol/L (ref 135–145)
Total Bilirubin: 0.4 mg/dL (ref 0.3–1.2)
Total Protein: 7 g/dL (ref 6.5–8.1)

## 2021-09-27 LAB — MAGNESIUM: Magnesium: 1.7 mg/dL (ref 1.7–2.4)

## 2021-09-27 MED ORDER — ALBUTEROL SULFATE (2.5 MG/3ML) 0.083% IN NEBU
2.5000 mg | INHALATION_SOLUTION | Freq: Once | RESPIRATORY_TRACT | Status: DC
Start: 2021-09-27 — End: 2021-09-27

## 2021-09-27 MED ORDER — ALBUTEROL SULFATE (2.5 MG/3ML) 0.083% IN NEBU
2.5000 mg | INHALATION_SOLUTION | RESPIRATORY_TRACT | Status: DC | PRN
Start: 2021-09-27 — End: 2021-09-30
  Administered 2021-09-28 – 2021-09-29 (×3): 2.5 mg via RESPIRATORY_TRACT
  Filled 2021-09-27 (×3): qty 3

## 2021-09-27 MED ORDER — SODIUM CHLORIDE 0.9 % IV SOLN: 1.0000 g | INTRAVENOUS | Status: DC

## 2021-09-27 MED ORDER — SODIUM CHLORIDE 0.9 % IV SOLN
1.0000 g | Freq: Once | INTRAVENOUS | Status: AC
  Administered 2021-09-27: 1 g via INTRAVENOUS
  Filled 2021-09-27: qty 10

## 2021-09-27 MED ORDER — ENOXAPARIN SODIUM 40 MG/0.4ML IJ SOSY
40.0000 mg | PREFILLED_SYRINGE | INTRAMUSCULAR | Status: DC
  Administered 2021-09-28 – 2021-09-29 (×3): 40 mg via SUBCUTANEOUS
  Filled 2021-09-27 (×3): qty 0.4

## 2021-09-27 MED ORDER — ALBUTEROL (5 MG/ML) CONTINUOUS INHALATION SOLN
7.5000 mg/h | INHALATION_SOLUTION | Freq: Once | RESPIRATORY_TRACT | Status: AC
Start: 2021-09-27 — End: 2021-09-27
  Administered 2021-09-27: 7.5 mg/h via RESPIRATORY_TRACT
  Filled 2021-09-27: qty 20

## 2021-09-27 MED ORDER — IOHEXOL 300 MG/ML  SOLN
100.0000 mL | Freq: Once | INTRAMUSCULAR | Status: DC | PRN
Start: 2021-09-27 — End: 2021-09-27

## 2021-09-27 MED ORDER — ACETAMINOPHEN 500 MG PO TABS
1000.0000 mg | ORAL_TABLET | Freq: Once | ORAL | Status: AC
  Administered 2021-09-27: 1000 mg via ORAL
  Filled 2021-09-27: qty 2

## 2021-09-27 MED ORDER — ACETAMINOPHEN 325 MG PO TABS
650.0000 mg | ORAL_TABLET | Freq: Four times a day (QID) | ORAL | Status: DC | PRN
  Administered 2021-09-29: 650 mg via ORAL
  Filled 2021-09-27: qty 2

## 2021-09-27 MED ORDER — LACTATED RINGERS IV BOLUS
1000.0000 mL | Freq: Once | INTRAVENOUS | Status: AC
  Administered 2021-09-27: 1000 mL via INTRAVENOUS

## 2021-09-27 MED ORDER — OSELTAMIVIR PHOSPHATE 75 MG PO CAPS
75.0000 mg | ORAL_CAPSULE | Freq: Two times a day (BID) | ORAL | Status: DC
  Administered 2021-09-28 – 2021-09-30 (×6): 75 mg via ORAL
  Filled 2021-09-27 (×6): qty 1

## 2021-09-27 MED ORDER — IPRATROPIUM-ALBUTEROL 0.5-2.5 (3) MG/3ML IN SOLN
9.0000 mL | Freq: Once | RESPIRATORY_TRACT | Status: AC
  Administered 2021-09-27: 9 mL via RESPIRATORY_TRACT
  Filled 2021-09-27: qty 9

## 2021-09-27 MED ORDER — POTASSIUM CHLORIDE CRYS ER 20 MEQ PO TBCR
40.0000 meq | EXTENDED_RELEASE_TABLET | Freq: Once | ORAL | Status: AC
  Administered 2021-09-28: 40 meq via ORAL
  Filled 2021-09-27: qty 2

## 2021-09-27 MED ORDER — IOHEXOL 350 MG/ML SOLN
100.0000 mL | Freq: Once | INTRAVENOUS | Status: AC | PRN
  Administered 2021-09-27: 100 mL via INTRAVENOUS

## 2021-09-27 MED ORDER — KETOROLAC TROMETHAMINE 15 MG/ML IJ SOLN
15.0000 mg | Freq: Once | INTRAMUSCULAR | Status: AC
  Administered 2021-09-27: 15 mg via INTRAVENOUS
  Filled 2021-09-27: qty 1

## 2021-09-27 MED ORDER — METHYLPREDNISOLONE SODIUM SUCC 125 MG IJ SOLR
125.0000 mg | Freq: Once | INTRAMUSCULAR | Status: AC
  Administered 2021-09-27: 125 mg via INTRAVENOUS
  Filled 2021-09-27: qty 2

## 2021-09-27 MED ORDER — MAGNESIUM OXIDE -MG SUPPLEMENT 400 (240 MG) MG PO TABS
800.0000 mg | ORAL_TABLET | Freq: Once | ORAL | Status: AC
  Administered 2021-09-27: 800 mg via ORAL
  Filled 2021-09-27: qty 2

## 2021-09-27 MED ORDER — ALBUTEROL SULFATE (2.5 MG/3ML) 0.083% IN NEBU
5.0000 mg | INHALATION_SOLUTION | Freq: Once | RESPIRATORY_TRACT | Status: AC
  Administered 2021-09-27: 5 mg via RESPIRATORY_TRACT
  Filled 2021-09-27: qty 6

## 2021-09-27 MED ORDER — SODIUM CHLORIDE 0.9 % IV SOLN
INTRAVENOUS | Status: DC
Start: 2021-09-28 — End: 2021-09-27

## 2021-09-27 MED ORDER — SODIUM CHLORIDE 0.9 % IV SOLN
500.0000 mg | INTRAVENOUS | Status: DC
  Filled 2021-09-27: qty 5

## 2021-09-27 MED ORDER — SODIUM CHLORIDE 0.9 % IV SOLN
500.0000 mg | Freq: Once | INTRAVENOUS | Status: AC
  Administered 2021-09-27: 500 mg via INTRAVENOUS
  Filled 2021-09-27: qty 5

## 2021-09-27 MED ORDER — ZOLPIDEM TARTRATE 5 MG PO TABS
5.0000 mg | ORAL_TABLET | Freq: Once | ORAL | Status: AC | PRN
  Administered 2021-09-28: 5 mg via ORAL
  Filled 2021-09-27: qty 1

## 2021-09-27 MED ORDER — POTASSIUM CHLORIDE CRYS ER 20 MEQ PO TBCR
40.0000 meq | EXTENDED_RELEASE_TABLET | Freq: Once | ORAL | Status: AC
  Administered 2021-09-27: 40 meq via ORAL
  Filled 2021-09-27: qty 2

## 2021-09-27 MED ORDER — BUDESONIDE 0.5 MG/2ML IN SUSP
2.0000 mg | Freq: Two times a day (BID) | RESPIRATORY_TRACT | Status: DC
  Administered 2021-09-28 (×3): 2 mg via RESPIRATORY_TRACT
  Administered 2021-09-29: 0.5 mg via RESPIRATORY_TRACT
  Administered 2021-09-29 – 2021-09-30 (×2): 2 mg via RESPIRATORY_TRACT
  Filled 2021-09-27 (×7): qty 8

## 2021-09-27 MED ORDER — IPRATROPIUM-ALBUTEROL 0.5-2.5 (3) MG/3ML IN SOLN
3.0000 mL | Freq: Four times a day (QID) | RESPIRATORY_TRACT | Status: DC
  Administered 2021-09-28 (×2): 3 mL via RESPIRATORY_TRACT
  Filled 2021-09-27 (×2): qty 3

## 2021-09-27 MED ORDER — METHYLPREDNISOLONE SODIUM SUCC 125 MG IJ SOLR
80.0000 mg | Freq: Two times a day (BID) | INTRAMUSCULAR | Status: DC
  Administered 2021-09-28 – 2021-09-29 (×3): 80 mg via INTRAVENOUS
  Filled 2021-09-27 (×3): qty 2

## 2021-09-27 MED ORDER — GUAIFENESIN ER 600 MG PO TB12
600.0000 mg | ORAL_TABLET | Freq: Two times a day (BID) | ORAL | Status: DC
  Administered 2021-09-28 – 2021-09-30 (×6): 600 mg via ORAL
  Filled 2021-09-27 (×6): qty 1

## 2021-09-27 NOTE — H&P (Addendum)
?History and Physical  ? ? ?Patty Howard MEQ:[id number] DOB: Nov 02, 1955 DOA: 09/27/2021 ? ?PCP: Buzzy Han, MD ? ?Patient coming from: [facility] ED ? ?Chief Complaint: Shortness of breath ? ?HPI: Patty Howard is a 66 y.o. female with medical history significant of asthma-COPD overlap syndrome, allergic rhinitis, GERD, chronic diastolic CHF, OSA, morbid obesity presented to the ED with complaints of shortness of breath and productive cough.  Febrile, tachycardic, and tachypneic.  SPO2 88% on room air, improved with 2 L supplemental oxygen.  Influenza A PCR positive.  WBC 14.1.  Hemoglobin 10.9, stable.  Potassium 2.9.  Lactic acid normal x2.  Blood cultures drawn.  CTA chest negative for PE or pneumonia. Patient was given Tylenol, albuterol, DuoNeb, Toradol, magnesium, IV Solu-Medrol, potassium, ceftriaxone, azithromycin, and 1 L IV fluid bolus. ? ?Patient reports progressively worsening dyspnea, cough productive of thick white sputum, and wheezing x1 week.  Yesterday she started having fevers.  She is using Trelegy inhaler daily and DuoNeb 4 times a day.  She also bought some Mucinex and another cough syrup from the pharmacy.  Reports mild central chest discomfort only when she coughs aggressively, not otherwise.  She is requesting Ambien for sleep which she takes at home as needed. ? ?Review of Systems:  ?Review of Systems  ?All other systems reviewed and are negative. ? ?Past Medical History:  ?Diagnosis Date  ? Anxiety   ? Arthritis   ? Asthma   ? COPD (chronic obstructive pulmonary disease) ([place])   ? Fracture   ? closed displaced left radial head  ? GERD (gastroesophageal reflux disease)   ? Hypertension   ? Sleep apnea   ? does not wear CPAP  ? ? ?Past Surgical History:  ?Procedure Laterality Date  ? BACK SURGERY    ? spinal stimulator  ? BREAST BIOPSY Right 2017  ? benign  ? COLONOSCOPY W/ BIOPSIES AND POLYPECTOMY    ? DILATION AND CURETTAGE OF UTERUS    ? LAPAROSCOPIC ROUX-EN-Y GASTRIC  BYPASS WITH UPPER ENDOSCOPY AND REMOVAL OF LAP BAND    ? RADIAL HEAD ARTHROPLASTY Left 02/03/2019  ? Procedure: LEFT RADIAL HEAD ARTHROPLASTY;  Surgeon: [name], MD;  Location: [facility];  Service: Orthopedics;  Laterality: Left;  AXILLARY BLOCK VS BIER BLOCK  ? TOTAL HIP ARTHROPLASTY Left 01/22/2021  ? Procedure: LEFT TOTAL HIP ARTHROPLASTY ANTERIOR APPROACH;  Surgeon: [name], MD;  Location: [facility];  Service: Orthopedics;  Laterality: Left;  ? TUBAL LIGATION    ? ? ? reports that she quit smoking about 23 years ago. Her smoking use included cigarettes. She has a 5.00 pack-year smoking history. She has never used smokeless tobacco. She reports that she does not drink alcohol and does not use drugs. ? ?Allergies  ?Allergen Reactions  ? Banana Hives and Itching  ? ? ?Family History  ?Problem Relation Age of Onset  ? Hypertension Mother   ? Stroke Mother   ? Cancer Father   ? ? ?Prior to Admission medications   ?Medication Sig Start Date End Date Taking? Authorizing Provider  ?albuterol (PROVENTIL) (2.5 MG/3ML) 0.083% nebulizer solution Take 3 mLs (2.5 mg total) by nebulization every 6 (six) hours as needed for wheezing or shortness of breath. 04/23/21   Sigurd Sos, MD  ?albuterol (VENTOLIN HFA) 108 (90 Base) MCG/ACT inhaler Inhale 1-2 puffs into the lungs every 6 (six) hours as needed for wheezing or shortness of breath. 06/03/21   Valentina Shaggy, MD  ?amLODipine (NORVASC) 5 MG tablet  TAKE 1 TABLET (5 MG TOTAL) BY MOUTH DAILY. 09/24/20 04/01/21  Minus Breeding, MD  ?ascorbic acid (VITAMIN C) 500 MG tablet Take 500 mg by mouth daily.    [provider]  ?atorvastatin (LIPITOR) 10 MG tablet Take 10 mg by mouth daily.    [provider]  ?baclofen (LIORESAL) 10 MG tablet Take 10 mg by mouth 3 (three) times daily. 04/08/21   [provider]  ?Benralizumab (FASENRA) 30 MG/ML SOSY Inject 1 mL (30 mg total) into the skin every 8 (eight) weeks. 07/15/21   Valentina Shaggy, MD   ?benzonatate (TESSALON) 200 MG capsule Take 1 capsule (200 mg total) by mouth 3 (three) times daily as needed for cough. 06/30/21 06/30/22  Parrett, Fonnie Mu, NP  ?diazepam (VALIUM) 5 MG tablet Take 0.5 tablets (2.5 mg total) by mouth every 6 (six) hours as needed for muscle spasms (spasms). 04/03/21   Mesner, Corene Cornea, MD  ?Diethylpropion HCl CR 75 MG TB24 Take 75 mg by mouth every morning. 11/02/20   [provider]  ?doxycycline (VIBRA-TABS) 100 MG tablet Take 1 tablet (100 mg total) by mouth 2 (two) times daily. 06/30/21   Parrett, Fonnie Mu, NP  ?famotidine (PEPCID) 40 MG tablet Take 1 tablet (40 mg total) by mouth 2 (two) times daily for 14 days. 06/03/21 06/17/21  Valentina Shaggy, MD  ?fluticasone (FLOVENT HFA) 110 MCG/ACT inhaler Inhale 2 puffs into the lungs 2 (two) times daily. 06/03/21   Valentina Shaggy, MD  ?Fluticasone-Umeclidin-Vilant (TRELEGY ELLIPTA) 200-62.5-25 MCG/ACT AEPB Inhale 1 puff daily. 04/23/21   Sigurd Sos, MD  ?Fluticasone-Umeclidin-Vilant (TRELEGY ELLIPTA) 200-62.5-25 MCG/INH AEPB Inhale 1 puff into the lungs daily. 04/01/21   Laurin Coder, MD  ?furosemide (LASIX) 40 MG tablet Take 1 tablet (40 mg total) by mouth daily as needed for fluid or edema (WEIGHT GAIN OF 2LBS, SHORTNESS OF BREATH). ?Patient taking differently: Take 40 mg by mouth daily. 06/10/20 01/22/21  Minus Breeding, MD  ?ipratropium-albuterol (DUONEB) 0.5-2.5 (3) MG/3ML SOLN Take 3 mLs by nebulization every 6 (six) hours as needed. 04/23/21   Sigurd Sos, MD  ?levocetirizine (XYZAL) 5 MG tablet Take 1 tablet (5 mg total) by mouth every evening. 04/23/21   Sigurd Sos, MD  ?levothyroxine (SYNTHROID) 100 MCG tablet Take 100 mcg by mouth daily before breakfast.    [provider]  ?Lido-Capsaicin-Men-Methyl Sal (MEDI-PATCH-LIDOCAINE) 0.5-0.035-5-20 % PTCH Apply 1 patch topically daily. 04/03/21   Mesner, Corene Cornea, MD  ?losartan-hydrochlorothiazide (HYZAAR) 100-25 MG tablet Take 1 tablet by mouth daily.  05/24/20   Minus Breeding, MD  ?montelukast (SINGULAIR) 10 MG tablet Take 1 tablet (10 mg total) by mouth at bedtime. 04/23/21   Sigurd Sos, MD  ?Multiple Vitamin (MULTIVITAMIN WITH MINERALS) TABS tablet Take 1 tablet by mouth daily. Centrum Multivitamin    [provider]  ?pantoprazole (PROTONIX) 40 MG tablet TAKE 1 TABLET BY MOUTH EVERY DAY ?Patient taking differently: Take 40 mg by mouth daily as needed (acid reflux). 06/17/20   Laurin Coder, MD  ?predniSONE (DELTASONE) 20 MG tablet Take 1 tablet (20 mg total) by mouth daily with breakfast. 06/30/21   Parrett, Fonnie Mu, NP  ?predniSONE (STERAPRED UNI-PAK 21 TAB) 5 MG (21) TBPK tablet Take one tablet less each day, 6,5,4,3,2,1 take with food. 08/12/21   [name], MD  ?sucralfate (CARAFATE) 1 g tablet Take 1 tablet (1 g total) by mouth 4 (four) times daily -  with meals and at bedtime for 14 days. 06/03/21 06/17/21  Valentina Shaggy, MD  ?Vitamin D, Ergocalciferol, (DRISDOL) 1.25 MG (50000 UNIT) CAPS capsule Take 50,000 Units by mouth every Monday.  10/27/19   [provider]  ? ? ?Physical Exam: ?Vitals:  ? 09/27/21 1949 09/27/21 2000 09/27/21 2100 09/27/21 2245  ?BP:  (!) 113/91 116/61 (!) 107/55  ?Pulse: (!) 113 (!) 109 (!) 109 96  ?Resp: 20 20 17    ?Temp:    99.2 ?F (37.3 ?C)  ?TempSrc:    Oral  ?SpO2: 98% 96% 97% 97%  ? ? ?Physical Exam ?Vitals reviewed.  ?Constitutional:   ?   General: She is not in acute distress. ?HENT:  ?   Head: Normocephalic and atraumatic.  ?Eyes:  ?   Extraocular Movements: Extraocular movements intact.  ?   Conjunctiva/sclera: Conjunctivae normal.  ?Cardiovascular:  ?   Rate and Rhythm: Normal rate and regular rhythm.  ?   Pulses: Normal pulses.  ?Pulmonary:  ?   Effort: Pulmonary effort is normal. No respiratory distress.  ?   Breath sounds: Rhonchi present. No wheezing or rales.  ?Abdominal:  ?   General: Bowel sounds are normal. There is no distension.  ?   Palpations: Abdomen is soft.  ?   Tenderness:  There is no abdominal tenderness.  ?Musculoskeletal:     ?   General: No swelling or tenderness.  ?   Cervical back: Normal range of motion.  ?Skin: ?   General: Skin is warm and dry.  ?Neurological:  ?   Gen

## 2021-09-27 NOTE — ED Triage Notes (Addendum)
Patient reports 4 day history of shortness of breath. Has been taking OTC meds (mucinex) with no relief. Has history of asthma has taken duo-neb x 2 and albuterol with no relief this afternoon. No tylenol pta.  ? ?Patient spitting mucus into bag at current, states that it feels like her chest is full gunk.  ?

## 2021-09-27 NOTE — ED Provider Notes (Signed)
?  Physical Exam  ?BP (!) 113/95   Pulse (!) 110   Temp 99.3 ?F (37.4 ?C) (Oral)   Resp 20   SpO2 94%  ? ?Physical Exam ?Vitals and nursing note reviewed.  ?Constitutional:   ?   General: She is not in acute distress. ?   Appearance: She is well-developed. She is ill-appearing.  ?HENT:  ?   Head: Normocephalic and atraumatic.  ?Eyes:  ?   Conjunctiva/sclera: Conjunctivae normal.  ?Cardiovascular:  ?   Rate and Rhythm: Normal rate and regular rhythm.  ?   Heart sounds: No murmur heard. ?Pulmonary:  ?   Effort: Respiratory distress present.  ?   Breath sounds: Wheezing present.  ?Abdominal:  ?   Palpations: Abdomen is soft.  ?   Tenderness: There is no abdominal tenderness.  ?Musculoskeletal:     ?   General: No swelling.  ?   Cervical back: Neck supple.  ?Skin: ?   General: Skin is warm and dry.  ?   Capillary Refill: Capillary refill takes less than 2 seconds.  ?Neurological:  ?   Mental Status: She is alert.  ?Psychiatric:     ?   Mood and Affect: Mood normal.  ? ? ?Procedures  ?Marland KitchenCritical Care ?Performed by: Teressa Lower, MD ?Authorized by: Teressa Lower, MD  ? ?Critical care provider statement:  ?  Critical care time (minutes):  30 ?  Critical care was necessary to treat or prevent imminent or life-threatening deterioration of the following conditions:  Respiratory failure ?  Critical care was time spent personally by me on the following activities:  Development of treatment plan with patient or surrogate, discussions with consultants, evaluation of patient's response to treatment, examination of patient, ordering and review of laboratory studies, ordering and review of radiographic studies, ordering and performing treatments and interventions, pulse oximetry, re-evaluation of patient's condition and review of old charts ? ?ED Course / MDM  ? ? ?Medical Decision Making ?Amount and/or Complexity of Data Reviewed ?Labs: ordered. ?Radiology: ordered. ? ?Risk ?OTC drugs. ?Prescription drug  management. ?Decision regarding hospitalization. ? ? ?Patient received in handoff.  COPD exacerbation and fever.  Imaging unremarkable.  Patient is influenza positive.  She required 3 DuoNebs and on reevaluation continues to wheeze.  Received a continuous albuterol therapy for 1 hour and has improved but continued wheezing.  She will require admission for every 4 albuterol treatments in the setting of a influenza induced COPD exacerbation ? ? ? ? ?  ?Teressa Lower, MD ?09/27/21 1915 ? ?

## 2021-09-27 NOTE — ED Notes (Signed)
Patient transported to CT 

## 2021-09-27 NOTE — ED Provider Notes (Signed)
?[facility] ?Provider Note ? ? ?CSN: [id number] ?Arrival date & time: 09/27/21  1337 ? ?  ? ?History ? ?Chief Complaint  ?Patient presents with  ? Shortness of Breath  ? ? ?Patty Howard is a 66 y.o. female. ? ? ?Shortness of Breath ?Associated symptoms: cough, fever and vomiting   ?Patient presents for shortness of breath and productive cough.  Sputum has been yellow in color.  Symptoms have been present and worsening for the past 4 days.  Medical history includes asthma with COPD, HTN, obesity, GERD, tracheobronchomalacia, anxiety, and arthritis.  She has been treating her symptoms with DuoNeb breathing treatments at home.  She took 1 at 8 AM and another at 35 AM.  Due to the persistence of her symptoms, she called her primary doctor who advised her to come to the emergency department.  Patient has not had diarrhea.  She has had vomiting secondary to gagging on her phlegm.  This started today.  She has felt feverish at home. ?  ? ?Home Medications ?Prior to Admission medications   ?Medication Sig Start Date End Date Taking? Authorizing Provider  ?albuterol (PROVENTIL) (2.5 MG/3ML) 0.083% nebulizer solution Take 3 mLs (2.5 mg total) by nebulization every 6 (six) hours as needed for wheezing or shortness of breath. 04/23/21   Sigurd Sos, MD  ?albuterol (VENTOLIN HFA) 108 (90 Base) MCG/ACT inhaler Inhale 1-2 puffs into the lungs every 6 (six) hours as needed for wheezing or shortness of breath. 06/03/21   Valentina Shaggy, MD  ?amLODipine (NORVASC) 5 MG tablet TAKE 1 TABLET (5 MG TOTAL) BY MOUTH DAILY. 09/24/20 04/01/21  Minus Breeding, MD  ?ascorbic acid (VITAMIN C) 500 MG tablet Take 500 mg by mouth daily.    [provider]  ?atorvastatin (LIPITOR) 10 MG tablet Take 10 mg by mouth daily.    [provider]  ?baclofen (LIORESAL) 10 MG tablet Take 10 mg by mouth 3 (three) times daily. 04/08/21   [provider]  ?Benralizumab (FASENRA) 30 MG/ML SOSY  Inject 1 mL (30 mg total) into the skin every 8 (eight) weeks. 07/15/21   Valentina Shaggy, MD  ?benzonatate (TESSALON) 200 MG capsule Take 1 capsule (200 mg total) by mouth 3 (three) times daily as needed for cough. 06/30/21 06/30/22  Parrett, Fonnie Mu, NP  ?diazepam (VALIUM) 5 MG tablet Take 0.5 tablets (2.5 mg total) by mouth every 6 (six) hours as needed for muscle spasms (spasms). 04/03/21   Mesner, Corene Cornea, MD  ?Diethylpropion HCl CR 75 MG TB24 Take 75 mg by mouth every morning. 11/02/20   [provider]  ?doxycycline (VIBRA-TABS) 100 MG tablet Take 1 tablet (100 mg total) by mouth 2 (two) times daily. 06/30/21   Parrett, Fonnie Mu, NP  ?famotidine (PEPCID) 40 MG tablet Take 1 tablet (40 mg total) by mouth 2 (two) times daily for 14 days. 06/03/21 06/17/21  Valentina Shaggy, MD  ?fluticasone (FLOVENT HFA) 110 MCG/ACT inhaler Inhale 2 puffs into the lungs 2 (two) times daily. 06/03/21   Valentina Shaggy, MD  ?Fluticasone-Umeclidin-Vilant (TRELEGY ELLIPTA) 200-62.5-25 MCG/ACT AEPB Inhale 1 puff daily. 04/23/21   Sigurd Sos, MD  ?Fluticasone-Umeclidin-Vilant (TRELEGY ELLIPTA) 200-62.5-25 MCG/INH AEPB Inhale 1 puff into the lungs daily. 04/01/21   Laurin Coder, MD  ?furosemide (LASIX) 40 MG tablet Take 1 tablet (40 mg total) by mouth daily as needed for fluid or edema (WEIGHT GAIN OF 2LBS, SHORTNESS OF BREATH). ?Patient taking differently: Take 40 mg by mouth daily.  06/10/20 01/22/21  Minus Breeding, MD  ?ipratropium-albuterol (DUONEB) 0.5-2.5 (3) MG/3ML SOLN Take 3 mLs by nebulization every 6 (six) hours as needed. 04/23/21   Sigurd Sos, MD  ?levocetirizine (XYZAL) 5 MG tablet Take 1 tablet (5 mg total) by mouth every evening. 04/23/21   Sigurd Sos, MD  ?levothyroxine (SYNTHROID) 100 MCG tablet Take 100 mcg by mouth daily before breakfast.    [provider]  ?Lido-Capsaicin-Men-Methyl Sal (MEDI-PATCH-LIDOCAINE) 0.5-0.035-5-20 % PTCH Apply 1 patch topically daily. 04/03/21   Mesner,  Corene Cornea, MD  ?losartan-hydrochlorothiazide (HYZAAR) 100-25 MG tablet Take 1 tablet by mouth daily. 05/24/20   Minus Breeding, MD  ?montelukast (SINGULAIR) 10 MG tablet Take 1 tablet (10 mg total) by mouth at bedtime. 04/23/21   Sigurd Sos, MD  ?Multiple Vitamin (MULTIVITAMIN WITH MINERALS) TABS tablet Take 1 tablet by mouth daily. Centrum Multivitamin    [provider]  ?pantoprazole (PROTONIX) 40 MG tablet TAKE 1 TABLET BY MOUTH EVERY DAY ?Patient taking differently: Take 40 mg by mouth daily as needed (acid reflux). 06/17/20   Laurin Coder, MD  ?predniSONE (DELTASONE) 20 MG tablet Take 1 tablet (20 mg total) by mouth daily with breakfast. 06/30/21   Parrett, Fonnie Mu, NP  ?predniSONE (STERAPRED UNI-PAK 21 TAB) 5 MG (21) TBPK tablet Take one tablet less each day, 6,5,4,3,2,1 take with food. 08/12/21   [name], MD  ?sucralfate (CARAFATE) 1 g tablet Take 1 tablet (1 g total) by mouth 4 (four) times daily -  with meals and at bedtime for 14 days. 06/03/21 06/17/21  Valentina Shaggy, MD  ?Vitamin D, Ergocalciferol, (DRISDOL) 1.25 MG (50000 UNIT) CAPS capsule Take 50,000 Units by mouth every Monday.  10/27/19   [provider]  ?   ? ?Allergies    ?Banana   ? ?Review of Systems   ?Review of Systems  ?Constitutional:  Positive for fatigue and fever.  ?Respiratory:  Positive for cough and shortness of breath.   ?Gastrointestinal:  Positive for vomiting.  ? ?Physical Exam ?Updated Vital Signs ?BP (!) 147/101 (BP Location: Left Arm)   Pulse (!) 110   Temp (!) 102.1 ?F (38.9 ?C) (Oral)   Resp (!) 22   SpO2 98%  ?Physical Exam ?Vitals and nursing note reviewed.  ?Constitutional:   ?   Appearance: She is well-developed. She is not toxic-appearing or diaphoretic.  ?HENT:  ?   Head: Normocephalic and atraumatic.  ?   Mouth/Throat:  ?   Mouth: Mucous membranes are moist.  ?Eyes:  ?   Conjunctiva/sclera: Conjunctivae normal.  ?Cardiovascular:  ?   Rate and Rhythm: Regular rhythm. Tachycardia  present.  ?   Heart sounds: No murmur heard. ?Pulmonary:  ?   Effort: Tachypnea present. No respiratory distress.  ?   Breath sounds: Wheezing and rhonchi present.  ?Chest:  ?   Chest wall: No tenderness or edema.  ?Abdominal:  ?   Palpations: Abdomen is soft.  ?   Tenderness: There is no abdominal tenderness.  ?Musculoskeletal:     ?   General: No swelling.  ?   Cervical back: Normal range of motion and neck supple.  ?   Right lower leg: No edema.  ?   Left lower leg: No edema.  ?Skin: ?   General: Skin is warm and dry.  ?   Capillary Refill: Capillary refill takes less than 2 seconds.  ?Neurological:  ?   General: No focal deficit present.  ?   Mental Status: She  is alert and oriented to person, place, and time.  ?   Cranial Nerves: No cranial nerve deficit.  ?   Motor: No weakness.  ?Psychiatric:     ?   Mood and Affect: Mood normal.     ?   Behavior: Behavior normal.  ? ? ?ED Results / Procedures / Treatments   ?Labs ?(all labs ordered are listed, but only abnormal results are displayed) ?Labs Reviewed  ?CBC WITH DIFFERENTIAL/PLATELET - Abnormal; Notable for the following components:  ?    Result Value  ? WBC 14.1 (*)   ? Hemoglobin 10.9 (*)   ? HCT 34.5 (*)   ? MCV 78.9 (*)   ? MCH 24.9 (*)   ? RDW 16.5 (*)   ? Neutro Abs 12.2 (*)   ? All other components within normal limits  ?CULTURE, RESPIRATORY W GRAM STAIN  ?RESP PANEL BY RT-PCR (FLU A&B, COVID) [id number]  ?CULTURE, BLOOD (ROUTINE X 2)  ?CULTURE, BLOOD (ROUTINE X 2)  ?COMPREHENSIVE METABOLIC PANEL  ?MAGNESIUM  ?LACTIC ACID, PLASMA  ?LACTIC ACID, PLASMA  ? ? ?EKG ?EKG Interpretation ? ?Date/Time:  Saturday September 27 2021 13:47:27 EDT ?Ventricular Rate:  106 ?PR Interval:  150 ?QRS Duration: 89 ?QT Interval:  354 ?QTC Calculation: 471 ?R Axis:   78 ?Text Interpretation: Sinus tachycardia Atrial premature complexes Borderline ST depression, diffuse leads Confirmed by Godfrey Pick [phone]) on 09/27/2021 2:01:28 PM ? ?Radiology ?DG Chest Portable 1 View ? ?Result Date:  09/27/2021 ?CLINICAL DATA:  Cough and congestion for 1 week. EXAM: PORTABLE CHEST 1 VIEW COMPARISON:  07/21/2021 FINDINGS: Stable cardiomediastinal contours. No signs of pleural effusion or edema. No airspace o

## 2021-09-27 NOTE — ED Notes (Signed)
Multiple attempts to obtain second culture by both edp and RN were unsuccessful. ?

## 2021-09-27 NOTE — ED Notes (Signed)
One set of blood cultures obtain before start of antibiotics.  Unable to obtain second set due to difficult PIV start. ?

## 2021-09-28 DIAGNOSIS — Z7952 Long term (current) use of systemic steroids: Secondary | ICD-10-CM | POA: Diagnosis not present

## 2021-09-28 DIAGNOSIS — J101 Influenza due to other identified influenza virus with other respiratory manifestations: Secondary | ICD-10-CM | POA: Diagnosis present

## 2021-09-28 DIAGNOSIS — Z8249 Family history of ischemic heart disease and other diseases of the circulatory system: Secondary | ICD-10-CM | POA: Diagnosis not present

## 2021-09-28 DIAGNOSIS — J441 Chronic obstructive pulmonary disease with (acute) exacerbation: Secondary | ICD-10-CM

## 2021-09-28 DIAGNOSIS — I110 Hypertensive heart disease with heart failure: Secondary | ICD-10-CM | POA: Diagnosis present

## 2021-09-28 DIAGNOSIS — R6520 Severe sepsis without septic shock: Secondary | ICD-10-CM | POA: Diagnosis present

## 2021-09-28 DIAGNOSIS — Z7951 Long term (current) use of inhaled steroids: Secondary | ICD-10-CM | POA: Diagnosis not present

## 2021-09-28 DIAGNOSIS — K219 Gastro-esophageal reflux disease without esophagitis: Secondary | ICD-10-CM | POA: Diagnosis present

## 2021-09-28 DIAGNOSIS — R0602 Shortness of breath: Secondary | ICD-10-CM | POA: Diagnosis present

## 2021-09-28 DIAGNOSIS — F419 Anxiety disorder, unspecified: Secondary | ICD-10-CM | POA: Diagnosis present

## 2021-09-28 DIAGNOSIS — Z87891 Personal history of nicotine dependence: Secondary | ICD-10-CM | POA: Diagnosis not present

## 2021-09-28 DIAGNOSIS — G4733 Obstructive sleep apnea (adult) (pediatric): Secondary | ICD-10-CM | POA: Diagnosis present

## 2021-09-28 DIAGNOSIS — A4189 Other specified sepsis: Secondary | ICD-10-CM | POA: Diagnosis present

## 2021-09-28 DIAGNOSIS — Z6841 Body Mass Index (BMI) 40.0 and over, adult: Secondary | ICD-10-CM | POA: Diagnosis not present

## 2021-09-28 DIAGNOSIS — Z91018 Allergy to other foods: Secondary | ICD-10-CM | POA: Diagnosis not present

## 2021-09-28 DIAGNOSIS — M1990 Unspecified osteoarthritis, unspecified site: Secondary | ICD-10-CM | POA: Diagnosis present

## 2021-09-28 DIAGNOSIS — Z20822 Contact with and (suspected) exposure to covid-19: Secondary | ICD-10-CM | POA: Diagnosis present

## 2021-09-28 DIAGNOSIS — Z9884 Bariatric surgery status: Secondary | ICD-10-CM | POA: Diagnosis not present

## 2021-09-28 DIAGNOSIS — J9601 Acute respiratory failure with hypoxia: Secondary | ICD-10-CM | POA: Diagnosis present

## 2021-09-28 DIAGNOSIS — E876 Hypokalemia: Secondary | ICD-10-CM | POA: Diagnosis present

## 2021-09-28 DIAGNOSIS — J45901 Unspecified asthma with (acute) exacerbation: Secondary | ICD-10-CM | POA: Diagnosis present

## 2021-09-28 DIAGNOSIS — Z79620 Long term (current) use of immunosuppressive biologic: Secondary | ICD-10-CM | POA: Diagnosis not present

## 2021-09-28 DIAGNOSIS — Z79890 Hormone replacement therapy (postmenopausal): Secondary | ICD-10-CM | POA: Diagnosis not present

## 2021-09-28 DIAGNOSIS — I5032 Chronic diastolic (congestive) heart failure: Secondary | ICD-10-CM | POA: Diagnosis present

## 2021-09-28 HISTORY — DX: Chronic obstructive pulmonary disease with (acute) exacerbation: J44.1

## 2021-09-28 LAB — CBC WITH DIFFERENTIAL/PLATELET
Abs Immature Granulocytes: 0.05 10*3/uL (ref 0.00–0.07)
Basophils Absolute: 0 10*3/uL (ref 0.0–0.1)
Basophils Relative: 0 %
Eosinophils Absolute: 0 10*3/uL (ref 0.0–0.5)
Eosinophils Relative: 0 %
HCT: 32.7 % — ABNORMAL LOW (ref 36.0–46.0)
Hemoglobin: 10.2 g/dL — ABNORMAL LOW (ref 12.0–15.0)
Immature Granulocytes: 0 %
Lymphocytes Relative: 8 %
Lymphs Abs: 1.1 10*3/uL (ref 0.7–4.0)
MCH: 24.8 pg — ABNORMAL LOW (ref 26.0–34.0)
MCHC: 31.2 g/dL (ref 30.0–36.0)
MCV: 79.4 fL — ABNORMAL LOW (ref 80.0–100.0)
Monocytes Absolute: 0.1 10*3/uL (ref 0.1–1.0)
Monocytes Relative: 1 %
Neutro Abs: 12.8 10*3/uL — ABNORMAL HIGH (ref 1.7–7.7)
Neutrophils Relative %: 91 %
Platelets: 317 10*3/uL (ref 150–400)
RBC: 4.12 MIL/uL (ref 3.87–5.11)
RDW: 16.5 % — ABNORMAL HIGH (ref 11.5–15.5)
WBC: 14.1 10*3/uL — ABNORMAL HIGH (ref 4.0–10.5)
nRBC: 0 % (ref 0.0–0.2)

## 2021-09-28 LAB — CBC
HCT: 34.3 % — ABNORMAL LOW (ref 36.0–46.0)
Hemoglobin: 10.8 g/dL — ABNORMAL LOW (ref 12.0–15.0)
MCH: 25.2 pg — ABNORMAL LOW (ref 26.0–34.0)
MCHC: 31.5 g/dL (ref 30.0–36.0)
MCV: 80.1 fL (ref 80.0–100.0)
Platelets: 351 10*3/uL (ref 150–400)
RBC: 4.28 MIL/uL (ref 3.87–5.11)
RDW: 16.6 % — ABNORMAL HIGH (ref 11.5–15.5)
WBC: 18.5 10*3/uL — ABNORMAL HIGH (ref 4.0–10.5)
nRBC: 0 % (ref 0.0–0.2)

## 2021-09-28 LAB — PROCALCITONIN: Procalcitonin: 0.22 ng/mL

## 2021-09-28 MED ORDER — IPRATROPIUM-ALBUTEROL 0.5-2.5 (3) MG/3ML IN SOLN: 3.0000 mL | Freq: Four times a day (QID) | RESPIRATORY_TRACT | Status: DC | PRN

## 2021-09-28 MED ORDER — IPRATROPIUM-ALBUTEROL 0.5-2.5 (3) MG/3ML IN SOLN
3.0000 mL | Freq: Four times a day (QID) | RESPIRATORY_TRACT | Status: DC
  Administered 2021-09-28 – 2021-09-29 (×4): 3 mL via RESPIRATORY_TRACT
  Filled 2021-09-28 (×5): qty 3

## 2021-09-28 MED ORDER — POTASSIUM CHLORIDE CRYS ER 20 MEQ PO TBCR
40.0000 meq | EXTENDED_RELEASE_TABLET | Freq: Once | ORAL | Status: AC
  Administered 2021-09-28: 40 meq via ORAL
  Filled 2021-09-28: qty 2

## 2021-09-28 MED ORDER — ZOLPIDEM TARTRATE 5 MG PO TABS
5.0000 mg | ORAL_TABLET | Freq: Once | ORAL | Status: AC
  Administered 2021-09-28: 5 mg via ORAL
  Filled 2021-09-28: qty 1

## 2021-09-28 NOTE — Progress Notes (Signed)
?PROGRESS NOTE ? ? ? ?Patty Howard  DCV:[id number] DOB: 02-01-1956 DOA: 09/27/2021 ?PCP: Buzzy Han, MD  ? ? ?Brief Narrative:  ?66 year old with history of asthma/COPD, allergic rhinitis and GERD, obstructive sleep apnea on CPAP presented to the ER with 1 week of cough shortness of breath not relieved with over-the-counter medications.  In the emergency room, wheezing.  WBC count 14.  Potassium 2.9.  Lactic acid normal.  Blood cultures drawn.  CTA chest negative for PE or pneumonia.  Influenza positive. ? ? ?Assessment & Plan: ?  ?Acute exacerbation of COPD secondary to influenza A infection: ?Agree with admission because of severity of symptoms.  Still has significant symptoms. ?Aggressive bronchodilator therapy, IV steroids, inhalational steroids, scheduled and as needed bronchodilators, deep breathing exercises, incentive spirometry, chest physiotherapy and respiratory therapy consult. ?Patient is on Tamiflu, continue for total 5 days. ?Droplet precautions. ?Receiving antibiotics, not indicated.  Will discontinue. ?Supplemental oxygen to keep saturations more than 92%. ? ?Sleep apnea: On CPAP.  Using in the hospital. ? ?Hypokalemia: Replaced.  Magnesium adequate.  We will recheck tomorrow morning. ? ?Essential hypertension: Blood pressures normal.  Continue to hold antihypertensives today. ? ? ?DVT prophylaxis: enoxaparin (LOVENOX) injection 40 mg Start: 09/28/21 0000 ? ? ?Code Status: Full code ?Family Communication: None ?Disposition Plan: Status is: Observation ?The patient will require care spanning > 2 midnights and should be moved to inpatient because: Significant wheezing and bronchospasm needing inpatient treatment, monitoring and respiratory therapy. ?  ? ? ?Consultants:  ?None ? ?Procedures:  ?None ? ?Antimicrobials:  ?Tamiflu 4/15--- ?Rocephin azithromycin 4/15--4/16 ? ? ?Subjective: ?Patient seen and examined.  Temperature 102.1 overnight.  Still feeling wheezy and tight.  Able to  come off the oxygen but feels tight on walking. ? ?Objective: ?Vitals:  ? 09/28/21 0210 09/28/21 7579 09/28/21 0913 09/28/21 0946  ?BP:  117/73  110/74  ?Pulse: 80 75  91  ?Resp: 16 20  18   ?Temp:  97.9 ?F (36.6 ?C)  98.4 ?F (36.9 ?C)  ?TempSrc:  Axillary  Oral  ?SpO2: 98%  100% 98%  ?Weight:      ?Height:      ? ? ?Intake/Output Summary (Last 24 hours) at 09/28/2021 1340 ?Last data filed at 09/27/2021 1747 ?Gross per 24 hour  ?Intake 351.87 ml  ?Output --  ?Net 351.87 ml  ? ?Filed Weights  ? 09/27/21 2245  ?Weight: 125 kg  ? ? ?Examination: ? ?General exam: Appears calm and comfortable at rest. ?Respiratory system: Extensive bilateral wheezing and fine crackles. ?Cardiovascular system: S1 & S2 heard, RRR. No JVD, murmurs, rubs, gallops or clicks. No pedal edema. ?Gastrointestinal system: Abdomen is nondistended, soft and nontender. No organomegaly or masses felt. Normal bowel sounds heard. ?Central nervous system: Alert and oriented. No focal neurological deficits. ?Extremities: Symmetric 5 x 5 power. ?Skin: No rashes, lesions or ulcers ?Psychiatry: Judgement and insight appear normal. Mood & affect appropriate.  ? ? ? ?Data Reviewed: I have personally reviewed following labs and imaging studies ? ?CBC: ?Recent Labs  ?Lab 09/27/21 ?1447 09/27/21 ?2254  ?WBC 14.1* 18.5*  ?NEUTROABS 12.2*  --   ?HGB 10.9* 10.8*  ?HCT 34.5* 34.3*  ?MCV 78.9* 80.1  ?PLT 324 351  ? ?Basic Metabolic Panel: ?Recent Labs  ?Lab 09/27/21 ?1447  ?NA 138  ?K 2.9*  ?CL 100  ?CO2 29  ?GLUCOSE 103*  ?BUN 11  ?CREATININE 0.64  ?CALCIUM 9.5  ?MG 1.7  ? ?GFR: ?Estimated Creatinine Clearance: 96.6 mL/min (by C-G formula based on  SCr of 0.64 mg/dL). ?Liver Function Tests: ?Recent Labs  ?Lab 09/27/21 ?1447  ?AST 22  ?ALT 16  ?ALKPHOS 94  ?BILITOT 0.4  ?PROT 7.0  ?ALBUMIN 4.4  ? ?No results for input(s): LIPASE, AMYLASE in the last 168 hours. ?No results for input(s): AMMONIA in the last 168 hours. ?Coagulation Profile: ?No results for input(s): INR,  PROTIME in the last 168 hours. ?Cardiac Enzymes: ?No results for input(s): CKTOTAL, CKMB, CKMBINDEX, TROPONINI in the last 168 hours. ?BNP (last 3 results) ?No results for input(s): PROBNP in the last 8760 hours. ?HbA1C: ?No results for input(s): HGBA1C in the last 72 hours. ?CBG: ?No results for input(s): GLUCAP in the last 168 hours. ?Lipid Profile: ?No results for input(s): CHOL, HDL, LDLCALC, TRIG, CHOLHDL, LDLDIRECT in the last 72 hours. ?Thyroid Function Tests: ?No results for input(s): TSH, T4TOTAL, FREET4, T3FREE, THYROIDAB in the last 72 hours. ?Anemia Panel: ?No results for input(s): VITAMINB12, FOLATE, FERRITIN, TIBC, IRON, RETICCTPCT in the last 72 hours. ?Sepsis Labs: ?Recent Labs  ?Lab 09/27/21 ?1503 09/27/21 ?1746 09/27/21 ?2254  ?PROCALCITON  --   --  0.22  ?LATICACIDVEN 1.7 1.6  --   ? ? ?Recent Results (from the past 240 hour(s))  ?Culture, Respiratory w Gram Stain     Status: None (Preliminary result)  ? Collection Time: 09/27/21  2:06 PM  ? Specimen: SPU; Respiratory  ?Result Value Ref Range Status  ? Specimen Description   Final  ?  SPUTUM ?Performed at [facility], [address], [place], [place] [address] ?  ? Special Requests   Final  ?  NONE ?Performed at [facility], [address] [place], [place] [address] ?  ? Gram Stain   Final  ?  FEW WBC PRESENT, PREDOMINANTLY PMN ?FEW GRAM POSITIVE COCCI IN PAIRS ?  ? Culture   Final  ?  CULTURE REINCUBATED FOR BETTER GROWTH ?Performed at [facility], [place] [address], [place], [place] [address] ?  ? Report Status PENDING  Incomplete  ?Resp Panel by RT-PCR (Flu A&B, Covid) Nasopharyngeal Swab     Status: Abnormal  ? Collection Time: 09/27/21  2:06 PM  ? Specimen: Nasopharyngeal Swab; Nasopharyngeal(NP) swabs in vial transport medium  ?Result Value Ref Range Status  ? SARS Coronavirus 2 by RT PCR NEGATIVE NEGATIVE Final  ?  Comment: (NOTE) ?SARS-CoV-2 target nucleic acids are NOT DETECTED. ? ?The  SARS-CoV-2 RNA is generally detectable in upper respiratory ?specimens during the acute phase of infection. The lowest ?concentration of SARS-CoV-2 viral copies this assay can detect is ?138 copies/mL. A negative result does not preclude SARS-Cov-2 ?infection and should not be used as the sole basis for treatment or ?other patient management decisions. A negative result may occur with  ?improper specimen collection/handling, submission of specimen other ?than nasopharyngeal swab, presence of viral mutation(s) within the ?areas targeted by this assay, and inadequate number of viral ?copies(<138 copies/mL). A negative result must be combined with ?clinical observations, patient history, and epidemiological ?information. The expected result is Negative. ? ?Fact Sheet for Patients:  ?EntrepreneurPulse.com.au ? ?Fact Sheet for Healthcare Providers:  ?IncredibleEmployment.be ? ?This test is no t yet approved or cleared by the [place] FDA and  ?has been authorized for detection and/or diagnosis of SARS-CoV-2 by ?FDA under an Emergency Use Authorization (EUA). This EUA will remain  ?in effect (meaning this test can be used) for the duration of the ?COVID-19 declaration under Section 564(b)(1) of the Act, 21 ?U.S.C.section 360bbb-3(b)(1), unless the  authorization is terminated  ?or revoked sooner.  ? ? ?  ? Influenza A by PCR POSITIVE (A) NEGATIVE Final  ? Influenza B by PCR NEGATIVE NEGATIVE Final  ?  Comment: (NOTE) ?The Xpert Xpress SARS-CoV-2/FLU/RSV plus assay is intended as an aid ?in the diagnosis of influenza from Nasopharyngeal swab specimens and ?should not be used as a sole basis for treatment. Nasal washings and ?aspirates are unacceptable for Xpert Xpress SARS-CoV-2/FLU/RSV ?testing. ? ?Fact Sheet for Patients: ?EntrepreneurPulse.com.au ? ?Fact Sheet for Healthcare Providers: ?IncredibleEmployment.be ? ?This test is not yet approved or  cleared by the [place] FDA and ?has been authorized for detection and/or diagnosis of SARS-CoV-2 by ?FDA under an Emergency Use Authorization (EUA). This EUA will remain ?in effect (meaning this test can b

## 2021-09-28 NOTE — Care Management Obs Status (Signed)
MEDICARE OBSERVATION STATUS NOTIFICATION ? ? ?Patient Details  ?Name: Patty Howard ?MRN: [id number] ?Date of Birth: 1956/01/04 ? ? ?Medicare Observation Status Notification Given:  Yes ? ? ? ?Elier Zellars Darnell Level., RN ?09/28/2021, 2:27 PM ?

## 2021-09-28 NOTE — Progress Notes (Signed)
Pt is influenza+. Ok to stop ceftriaxone/azith per Dr. Sloan Leiter. ? ?Onnie Boer, PharmD, BCIDP, AAHIVP, CPP ?Infectious Disease Pharmacist ?09/28/2021 1:13 PM ? ? ?

## 2021-09-29 ENCOUNTER — Ambulatory Visit: Payer: Medicare Other

## 2021-09-29 DIAGNOSIS — J441 Chronic obstructive pulmonary disease with (acute) exacerbation: Secondary | ICD-10-CM | POA: Diagnosis not present

## 2021-09-29 DIAGNOSIS — J45901 Unspecified asthma with (acute) exacerbation: Secondary | ICD-10-CM | POA: Diagnosis not present

## 2021-09-29 LAB — CULTURE, RESPIRATORY W GRAM STAIN: Culture: NORMAL

## 2021-09-29 MED ORDER — ZOLPIDEM TARTRATE 5 MG PO TABS
5.0000 mg | ORAL_TABLET | Freq: Every evening | ORAL | Status: DC | PRN
  Administered 2021-09-29: 5 mg via ORAL
  Filled 2021-09-29: qty 1

## 2021-09-29 MED ORDER — PREDNISONE 10 MG PO TABS
50.0000 mg | ORAL_TABLET | Freq: Every day | ORAL | Status: DC
  Administered 2021-09-30: 50 mg via ORAL
  Filled 2021-09-29: qty 1

## 2021-09-29 MED ORDER — DOXYCYCLINE HYCLATE 100 MG PO TABS
100.0000 mg | ORAL_TABLET | Freq: Two times a day (BID) | ORAL | Status: DC
  Administered 2021-09-29 – 2021-09-30 (×3): 100 mg via ORAL
  Filled 2021-09-29 (×4): qty 1

## 2021-09-29 MED ORDER — AZITHROMYCIN 500 MG PO TABS: 500.0000 mg | ORAL_TABLET | Freq: Every day | ORAL | Status: DC

## 2021-09-29 NOTE — Plan of Care (Signed)
°  Problem: Education: °Goal: Knowledge of disease or condition will improve °Outcome: Progressing °Goal: Knowledge of the prescribed therapeutic regimen will improve °Outcome: Progressing °Goal: Individualized Educational Video(s) °Outcome: Progressing °  °

## 2021-09-29 NOTE — Progress Notes (Signed)
Patient using CPAP independently. ?

## 2021-09-29 NOTE — Progress Notes (Signed)
Heart Failure Navigator Progress Note ? ?Assessed for Heart & Vascular TOC clinic readiness.  ?Patient does not meet criteria due to COPD. Exacerbation. ? ? ? ?Earnestine Leys, BSN, RN ?Heart Failure Nurse Navigator ?Secure Chat Only   ?

## 2021-09-29 NOTE — Progress Notes (Signed)
?PROGRESS NOTE ? ? ? ?Patty Howard  WUG:[id number] DOB: Dec 03, 1955 DOA: 09/27/2021 ?PCP: Buzzy Han, MD  ? ? ?Brief Narrative:  ?66 year old with history of asthma/COPD, allergic rhinitis and GERD, obstructive sleep apnea on CPAP presented to the ER with 1 week of cough and  shortness of breath not relieved with over-the-counter medications.  In the emergency room, wheezing.  WBC count 14.  Potassium 2.9.  Lactic acid normal.  Blood cultures drawn.  CTA chest negative for PE or pneumonia.  Influenza positive. ? ? ?Assessment & Plan: ?  ?Acute exacerbation of COPD secondary to influenza A infection: ?Still has significant symptoms. ?Aggressive bronchodilator therapy, IV steroids, inhalational steroids, scheduled and as needed bronchodilators, deep breathing exercises, incentive spirometry, chest physiotherapy and respiratory therapy consult. ?Patient is on Tamiflu, continue for total 5 days. ?Droplet precautions. ?Due to significant symptoms, will treat with 5 days of doxycycline. ?Supplemental oxygen to keep saturations more than 92%. ?Mobilize. ? ?Sleep apnea: On CPAP.  Using in the hospital. ? ?Hypokalemia: Replaced.  Magnesium adequate.  We will recheck tomorrow morning. ? ?Essential hypertension: Blood pressures normal.  Continue to hold antihypertensives today. ? ? ?DVT prophylaxis: enoxaparin (LOVENOX) injection 40 mg Start: 09/28/21 0000 ? ? ?Code Status: Full code ?Family Communication: None ?Disposition Plan: Status is: Inpatient.  Remains inpatient because of persistent wheezing and bronchospasm needing respiratory therapy treatment in the hospital. ? ? ?Consultants:  ?None ? ?Procedures:  ?None ? ?Antimicrobials:  ?Tamiflu 4/15--- ?Rocephin azithromycin 4/15--4/16 ?Doxycycline 4/17--- ? ? ?Subjective: ? ?Patient seen and examined.  Afebrile overnight.  Severe and persistent wheezing, unable to expectorate his sputum.  Unable to sleep at night. ? ?Objective: ?Vitals:  ? 09/28/21 2256  09/29/21 0458 09/29/21 0859 09/29/21 0901  ?BP:  134/73    ?Pulse:  85    ?Resp:  20    ?Temp:  (!) 97.5 ?F (36.4 ?C)    ?TempSrc:  Oral    ?SpO2: 100% 95% 97% 97%  ?Weight:      ?Height:      ? ?No intake or output data in the 24 hours ending 09/29/21 1151 ? ?Filed Weights  ? 09/27/21 2245  ?Weight: 125 kg  ? ? ?Examination: ? ?General: Calm and comfortable at rest.  Nagging cough, wheezing and bronchospasm on mobility and talking. ?Cardiovascular: S1-S2 normal.  Regular rate rhythm. ?Respiratory: Bilateral extensive expiratory and inspiratory wheezes. ?Gastrointestinal: Soft.  Nontender.  Bowel sound present. ?Ext: No edema.  No cyanosis.  No swelling. ?Neuro: Intact. ?Musculoskeletal: No deformities. ? ? ? ? ? ?Data Reviewed: I have personally reviewed following labs and imaging studies ? ?CBC: ?Recent Labs  ?Lab 09/27/21 ?1447 09/27/21 ?2254 09/28/21 ?1425  ?WBC 14.1* 18.5* 14.1*  ?NEUTROABS 12.2*  --  12.8*  ?HGB 10.9* 10.8* 10.2*  ?HCT 34.5* 34.3* 32.7*  ?MCV 78.9* 80.1 79.4*  ?PLT 324 351 317  ? ?Basic Metabolic Panel: ?Recent Labs  ?Lab 09/27/21 ?1447  ?NA 138  ?K 2.9*  ?CL 100  ?CO2 29  ?GLUCOSE 103*  ?BUN 11  ?CREATININE 0.64  ?CALCIUM 9.5  ?MG 1.7  ? ?GFR: ?Estimated Creatinine Clearance: 96.6 mL/min (by C-G formula based on SCr of 0.64 mg/dL). ?Liver Function Tests: ?Recent Labs  ?Lab 09/27/21 ?1447  ?AST 22  ?ALT 16  ?ALKPHOS 94  ?BILITOT 0.4  ?PROT 7.0  ?ALBUMIN 4.4  ? ?No results for input(s): LIPASE, AMYLASE in the last 168 hours. ?No results for input(s): AMMONIA in the last 168 hours. ?Coagulation Profile: ?No  results for input(s): INR, PROTIME in the last 168 hours. ?Cardiac Enzymes: ?No results for input(s): CKTOTAL, CKMB, CKMBINDEX, TROPONINI in the last 168 hours. ?BNP (last 3 results) ?No results for input(s): PROBNP in the last 8760 hours. ?HbA1C: ?No results for input(s): HGBA1C in the last 72 hours. ?CBG: ?No results for input(s): GLUCAP in the last 168 hours. ?Lipid Profile: ?No results  for input(s): CHOL, HDL, LDLCALC, TRIG, CHOLHDL, LDLDIRECT in the last 72 hours. ?Thyroid Function Tests: ?No results for input(s): TSH, T4TOTAL, FREET4, T3FREE, THYROIDAB in the last 72 hours. ?Anemia Panel: ?No results for input(s): VITAMINB12, FOLATE, FERRITIN, TIBC, IRON, RETICCTPCT in the last 72 hours. ?Sepsis Labs: ?Recent Labs  ?Lab 09/27/21 ?1503 09/27/21 ?1746 09/27/21 ?2254  ?PROCALCITON  --   --  0.22  ?LATICACIDVEN 1.7 1.6  --   ? ? ?Recent Results (from the past 240 hour(s))  ?Culture, Respiratory w Gram Stain     Status: None  ? Collection Time: 09/27/21  2:06 PM  ? Specimen: SPU; Respiratory  ?Result Value Ref Range Status  ? Specimen Description   Final  ?  SPUTUM ?Performed at [facility], [address] [place], [place] [address] ?  ? Special Requests   Final  ?  NONE ?Performed at [facility], [address], [place], [place] [address] ?  ? Gram Stain   Final  ?  FEW WBC PRESENT, PREDOMINANTLY PMN ?FEW GRAM POSITIVE COCCI IN PAIRS ?  ? Culture   Final  ?  FEW Normal respiratory flora-no Staph aureus or Pseudomonas seen ?Performed at [facility], [place] [address], [place], [place] [address] ?  ? Report Status 09/29/2021 FINAL  Final  ?Resp Panel by RT-PCR (Flu A&B, Covid) Nasopharyngeal Swab     Status: Abnormal  ? Collection Time: 09/27/21  2:06 PM  ? Specimen: Nasopharyngeal Swab; Nasopharyngeal(NP) swabs in vial transport medium  ?Result Value Ref Range Status  ? SARS Coronavirus 2 by RT PCR NEGATIVE NEGATIVE Final  ?  Comment: (NOTE) ?SARS-CoV-2 target nucleic acids are NOT DETECTED. ? ?The SARS-CoV-2 RNA is generally detectable in upper respiratory ?specimens during the acute phase of infection. The lowest ?concentration of SARS-CoV-2 viral copies this assay can detect is ?138 copies/mL. A negative result does not preclude SARS-Cov-2 ?infection and should not be used as the sole basis for treatment or ?other patient management decisions. A  negative result may occur with  ?improper specimen collection/handling, submission of specimen other ?than nasopharyngeal swab, presence of viral mutation(s) within the ?areas targeted by this assay, and inadequate number of viral ?copies(<138 copies/mL). A negative result must be combined with ?clinical observations, patient history, and epidemiological ?information. The expected result is Negative. ? ?Fact Sheet for Patients:  ?EntrepreneurPulse.com.au ? ?Fact Sheet for Healthcare Providers:  ?IncredibleEmployment.be ? ?This test is no t yet approved or cleared by the [place] FDA and  ?has been authorized for detection and/or diagnosis of SARS-CoV-2 by ?FDA under an Emergency Use Authorization (EUA). This EUA will remain  ?in effect (meaning this test can be used) for the duration of the ?COVID-19 declaration under Section 564(b)(1) of the Act, 21 ?U.S.C.section 360bbb-3(b)(1), unless the authorization is terminated  ?or revoked sooner.  ? ? ?  ? Influenza A by PCR POSITIVE (A) NEGATIVE Final  ? Influenza B by PCR NEGATIVE NEGATIVE Final  ?  Comment: (NOTE) ?The Xpert Xpress SARS-CoV-2/FLU/RSV plus assay is intended as an aid ?in the diagnosis of influenza from Nasopharyngeal swab  specimens and ?should not be used as a sole basis for treatment. Nasal washings and ?aspirates are unacceptable for Xpert Xpress SARS-CoV-2/FLU/RSV ?testing. ? ?Fact Sheet for Patients: ?EntrepreneurPulse.com.au ? ?Fact Sheet for Healthcare Providers: ?IncredibleEmployment.be ? ?This test is not yet approved or cleared by the [place] FDA and ?has been authorized for detection and/or diagnosis of SARS-CoV-2 by ?FDA under an Emergency Use Authorization (EUA). This EUA will remain ?in effect (meaning this test can be used) for the duration of the ?COVID-19 declaration under Section 564(b)(1) of the Act, 21 U.S.C. ?section 360bbb-3(b)(1), unless the  authorization is terminated or ?revoked. ? ?Performed at [facility], [address] ?[place], [place] [address] ?  ?Blood culture (routine x 2)     Status: None (Preliminary result)  ? Collection Ti

## 2021-09-29 NOTE — Progress Notes (Signed)
Mobility Specialist Progress Note  ? ? 09/29/21 1218  ?Mobility  ?Activity Ambulated independently in hallway  ?Level of Assistance Independent  ?Assistive Device None  ?Distance Ambulated (ft) 1000 ft  ?Activity Response Tolerated well  ?$Mobility charge 1 Mobility  ? ?Pt received in doorway and agreeable. No complaints. Took 3-4 short standing rest breaks to catch her breath. Returned to sitting EOB with call bell in reach.  ? ?Hildred Alamin ?Mobility Specialist  ?Primary: 5N M.S. Phone: [phone] ?Secondary: 6N M.S. Phone: [phone] ?  ?

## 2021-09-29 NOTE — Progress Notes (Signed)
?  Transition of Care (TOC) Screening Note ? ? ?Patient Details  ?Name: Patty Howard ?Date of Birth: 10/06/1955 ? ? ?Transition of Care (TOC) CM/SW Contact:    ?Cyndi Bender, RN ?Phone Number: ?09/29/2021, 8:29 AM ? ? ? ?Transition of Care Department [facility]) has reviewed patient.We will continue to monitor patient advancement through interdisciplinary progression rounds.Heart failure referral done. If new patient transition needs arise, please place a TOC consult. ? ? ?

## 2021-09-30 ENCOUNTER — Other Ambulatory Visit (HOSPITAL_COMMUNITY): Payer: Self-pay

## 2021-09-30 DIAGNOSIS — J45901 Unspecified asthma with (acute) exacerbation: Secondary | ICD-10-CM | POA: Diagnosis not present

## 2021-09-30 DIAGNOSIS — J441 Chronic obstructive pulmonary disease with (acute) exacerbation: Secondary | ICD-10-CM | POA: Diagnosis not present

## 2021-09-30 LAB — BASIC METABOLIC PANEL
Anion gap: 7 (ref 5–15)
BUN: 20 mg/dL (ref 8–23)
CO2: 27 mmol/L (ref 22–32)
Calcium: 8.9 mg/dL (ref 8.9–10.3)
Chloride: 107 mmol/L (ref 98–111)
Creatinine, Ser: 0.73 mg/dL (ref 0.44–1.00)
GFR, Estimated: >60 mL/min (ref >60)
Glucose, Bld: 92 mg/dL (ref 70–99)
Potassium: 3.3 mmol/L — ABNORMAL LOW (ref 3.5–5.1)
Sodium: 141 mmol/L (ref 135–145)

## 2021-09-30 MED ORDER — OSELTAMIVIR PHOSPHATE 75 MG PO CAPS
75.0000 mg | ORAL_CAPSULE | Freq: Two times a day (BID) | ORAL | 0 refills | Status: AC
Start: 2021-09-30 — End: 2021-10-03
  Filled 2021-09-30: qty 6, 3d supply, fill #0

## 2021-09-30 MED ORDER — DOXYCYCLINE HYCLATE 100 MG PO TABS
100.0000 mg | ORAL_TABLET | Freq: Two times a day (BID) | ORAL | 0 refills | Status: AC
  Filled 2021-09-30: qty 8, 4d supply, fill #0

## 2021-09-30 MED ORDER — POTASSIUM CHLORIDE CRYS ER 20 MEQ PO TBCR
20.0000 meq | EXTENDED_RELEASE_TABLET | Freq: Every day | ORAL | 0 refills | Status: DC
  Filled 2021-09-30: qty 30, 30d supply, fill #0

## 2021-09-30 MED ORDER — PREDNISONE 10 MG PO TABS
ORAL_TABLET | ORAL | 0 refills | Status: DC
  Filled 2021-09-30: qty 30, 12d supply, fill #0

## 2021-09-30 MED ORDER — DEXTROMETHORPHAN HBR 15 MG/5ML PO SYRP
10.0000 mL | ORAL_SOLUTION | Freq: Four times a day (QID) | ORAL | 0 refills | Status: DC | PRN
  Filled 2021-09-30: qty 120, 3d supply, fill #0

## 2021-09-30 NOTE — Progress Notes (Signed)
AVS provided to pt. All question answered at this time.  ?

## 2021-09-30 NOTE — Plan of Care (Signed)

## 2021-09-30 NOTE — Discharge Summary (Signed)
Physician Discharge Summary  ?Jasneet Schobert OYD:[id number] DOB: [birth year] DOA: 09/27/2021 ? ?PCP: Buzzy Han, MD ? ?Admit date: 09/27/2021 ?Discharge date: 09/30/2021 ? ?Admitted From: Home ?Disposition: Home ? ?Recommendations for Outpatient Follow-up:  ?Follow up with PCP in 1-2 weeks ?Continue to follow-up with allergy specialist and pulmonary. ? ?Home Health: N/A ?Equipment/Devices: Available at home ? ?Discharge Condition: Stable ?CODE STATUS: Full code ?Diet recommendation: Low-salt diet. ? ?Discharge Summary: ?Brief Narrative:  ?66 year old with history of asthma/COPD, allergic rhinitis and GERD, obstructive sleep apnea on CPAP presented to the ER with 1 week of cough and  shortness of breath not relieved with over-the-counter medications.  In the emergency room, wheezing.  WBC count 14.  Potassium 2.9.  Lactic acid normal.  Blood cultures drawn.  CTA chest negative for PE or pneumonia.  Influenza positive.  Admitted and treated for COPD exacerbation. ? ?Acute exacerbation of COPD secondary to influenza A infection.  Patient with history of allergic asthma. ?Admitted and treated with aggressive bronchodilator therapy, IV steroids, inhalers and steroids and chest physiotherapy. ?Tamiflu day 3/5. ?Doxycycline day 2/5. ?She had significant wheezing some improvement today.  Remains on room air with mobility. ?Discharging home with prednisone taper, she is already optimized on Singulair, DuoNebs, trelegy combination inhaler and has established follow-up with pulmonary and allergy.  She will use over-the-counter cough medications. ?Patient will continue to use her breathing exercises at home. ?  ?Sleep apnea: On CPAP.  Using in the hospital. ?  ?Hypokalemia: Replaced.  Potassium 3.3 today.  Patient is on losartan hydrochlorothiazide.  Occasionally takes Lasix.  Given her persistent low potassium, she will benefit with a scheduled replacement.  We will discharge on 20 mEq daily for 1 month.  Will  benefit with recheck potassium in 1 to 2 weeks through primary care physician. ?  ?Essential hypertension: Blood pressures elevated today.  She will go back on amlodipine, losartan hydrochlorothiazide. ? ?Stable for discharge today, she has adequate medications at home. ? ? ?Discharge Diagnoses:  ?Principal Problem: ?  Acute exacerbation of COPD with asthma ([place]) ?Active Problems: ?  Influenza A ?  Severe sepsis ([place]) ?  Hypokalemia ?  COPD with acute exacerbation ([place]) ? ? ? ?Discharge Instructions ? ?Discharge Instructions   ? ? Call MD for:  difficulty breathing, headache or visual disturbances   Complete by: As directed ?  ? Diet - low sodium heart healthy   Complete by: As directed ?  ? Increase activity slowly   Complete by: As directed ?  ? ?  ? ?Allergies as of 09/30/2021   ? ?   Reactions  ? Banana Hives, Itching  ? ?  ? ?  ?Medication List  ?  ? ?STOP taking these medications   ? ?ferrous gluconate 324 MG tablet ?Commonly known as: FERGON ?  ?ferrous sulfate 325 (65 FE) MG tablet ?  ? ?  ? ?TAKE these medications   ? ?albuterol (2.5 MG/3ML) 0.083% nebulizer solution ?Commonly known as: PROVENTIL ?Take 3 mLs (2.5 mg total) by nebulization every 6 (six) hours as needed for wheezing or shortness of breath. ?  ?albuterol 108 (90 Base) MCG/ACT inhaler ?Commonly known as: VENTOLIN HFA ?Inhale 1-2 puffs into the lungs every 6 (six) hours as needed for wheezing or shortness of breath. ?  ?amLODipine 5 MG tablet ?Commonly known as: NORVASC ?TAKE 1 TABLET (5 MG TOTAL) BY MOUTH DAILY. ?  ?ascorbic acid 500 MG tablet ?Commonly known as: VITAMIN C ?Take 500 mg by mouth daily. ?  ?  atorvastatin 10 MG tablet ?Commonly known as: LIPITOR ?Take 10 mg by mouth daily. ?  ?baclofen 10 MG tablet ?Commonly known as: LIORESAL ?Take 10 mg by mouth 3 (three) times daily. ?  ?dextromethorphan 15 MG/5ML syrup ?Take 10 mLs (30 mg total) by mouth 4 (four) times daily as needed for cough. ?  ?diazepam 5 MG tablet ?Commonly known as:  Valium ?Take 0.5 tablets (2.5 mg total) by mouth every 6 (six) hours as needed for muscle spasms (spasms). ?  ?doxycycline 100 MG tablet ?Commonly known as: VIBRA-TABS ?Take 1 tablet (100 mg total) by mouth every 12 (twelve) hours for 4 days. ?  ?Fasenra 30 MG/ML Sosy ?Generic drug: Benralizumab ?Inject 1 mL (30 mg total) into the skin every 8 (eight) weeks. ?  ?furosemide 40 MG tablet ?Commonly known as: LASIX ?Take 1 tablet (40 mg total) by mouth daily as needed for fluid or edema (WEIGHT GAIN OF 2LBS, SHORTNESS OF BREATH). ?What changed: reasons to take this ?  ?ipratropium-albuterol 0.5-2.5 (3) MG/3ML Soln ?Commonly known as: DUONEB ?Take 3 mLs by nebulization every 6 (six) hours as needed. ?What changed: reasons to take this ?  ?levocetirizine 5 MG tablet ?Commonly known as: XYZAL ?Take 1 tablet (5 mg total) by mouth every evening. ?  ?levothyroxine 100 MCG tablet ?Commonly known as: SYNTHROID ?Take 100 mcg by mouth daily before breakfast. ?  ?losartan-hydrochlorothiazide 100-25 MG tablet ?Commonly known as: HYZAAR ?Take 1 tablet by mouth daily. ?  ?Medi-Patch-Lidocaine 0.5-0.035-5-20 % Ptch ?Generic drug: Lido-Capsaicin-Men-Methyl Sal ?Apply 1 patch topically daily. ?  ?montelukast 10 MG tablet ?Commonly known as: SINGULAIR ?Take 1 tablet (10 mg total) by mouth at bedtime. ?  ?multivitamin with minerals Tabs tablet ?Take 1 tablet by mouth daily. Centrum Multivitamin ?  ?oseltamivir 75 MG capsule ?Commonly known as: TAMIFLU ?Take 1 capsule (75 mg total) by mouth 2 (two) times daily for 3 days. ?  ?Ozempic (0.25 or 0.5 MG/DOSE) 2 MG/1.5ML Sopn ?Generic drug: Semaglutide(0.25 or 0.5MG/DOS) ?Inject 0.5 mg into the skin once a week. Mondays ?  ?pantoprazole 40 MG tablet ?Commonly known as: PROTONIX ?TAKE 1 TABLET BY MOUTH EVERY DAY ?  ?potassium chloride SA 20 MEQ tablet ?Commonly known as: KLOR-CON M ?Take 1 tablet (20 mEq total) by mouth daily. ?  ?predniSONE 10 MG tablet ?Commonly known as: DELTASONE ?Take 4  tabs daily for 3 days, then 3 tabs daily for 3 days, then 2 tabs daily for 3 days, then 1 tab daily for 3 days ?  ?Trelegy Ellipta 200-62.5-25 MCG/ACT Aepb ?Generic drug: Fluticasone-Umeclidin-Vilant ?Inhale 1 puff daily. ?What changed:  ?how much to take ?how to take this ?when to take this ?  ?Vitamin D (Ergocalciferol) 1.25 MG (50000 UNIT) Caps capsule ?Commonly known as: DRISDOL ?Take 50,000 Units by mouth every Monday. ?  ? ?  ? ? Follow-up Information   ? ? Buzzy Han, MD Follow up.   ?Specialty: Family Medicine ?Contact information: ?[address] ?[place] [address] ?[phone] ? ? ?  ?  ? ? Minus Breeding, MD .   ?Specialty: Cardiology ?Contact information: ?[address] ?[address] ?[place] [address] ?[phone] ? ? ?  ?  ? ?  ?  ? ?  ? ?Allergies  ?Allergen Reactions  ? Banana Hives and Itching  ? ? ?Consultations: ?None ? ? ?Procedures/Studies: ?CT Angio Chest PE W and/or Wo Contrast ? ?Result Date: 09/27/2021 ?CLINICAL DATA:  Rule out pulmonary embolism. High probability. Four day history of shortness of breath.  EXAM: CT ANGIOGRAPHY CHEST WITH CONTRAST TECHNIQUE: Multidetector CT imaging of the chest was performed using the standard protocol during bolus administration of intravenous contrast. Multiplanar CT image reconstructions and MIPs were obtained to evaluate the vascular anatomy. RADIATION DOSE REDUCTION: This exam was performed according to the departmental dose-optimization program which includes automated exposure control, adjustment of the mA and/or kV according to patient size and/or use of iterative reconstruction technique. CONTRAST:  164m OMNIPAQUE IOHEXOL 350 MG/ML SOLN COMPARISON:  CT chest 11/11/2020 FINDINGS: Cardiovascular: Satisfactory opacification of the pulmonary arteries to the segmental level. No evidence of pulmonary embolism. Aortic atherosclerosis. Normal heart size. No pericardial effusion. Mediastinum/Nodes: No enlarged mediastinal, hilar, or  axillary lymph nodes. Thyroid gland, trachea, and esophagus demonstrate no significant findings. Lungs/Pleura: Scar noted within the medial left apex. No pleural effusion, airspace consolidation, atelectasis or

## 2021-10-01 ENCOUNTER — Ambulatory Visit (INDEPENDENT_AMBULATORY_CARE_PROVIDER_SITE_OTHER): Payer: Medicare Other | Admitting: Orthopaedic Surgery

## 2021-10-01 ENCOUNTER — Encounter: Payer: Self-pay | Admitting: Orthopaedic Surgery

## 2021-10-01 ENCOUNTER — Ambulatory Visit (INDEPENDENT_AMBULATORY_CARE_PROVIDER_SITE_OTHER): Payer: Medicare Other

## 2021-10-01 VITALS — BP 147/79 | HR 96 | Ht 70.0 in | Wt 270.0 lb

## 2021-10-01 DIAGNOSIS — M545 Low back pain, unspecified: Secondary | ICD-10-CM | POA: Diagnosis not present

## 2021-10-01 DIAGNOSIS — M65332 Trigger finger, left middle finger: Secondary | ICD-10-CM | POA: Diagnosis not present

## 2021-10-01 DIAGNOSIS — M5136 Other intervertebral disc degeneration, lumbar region: Secondary | ICD-10-CM

## 2021-10-01 DIAGNOSIS — G8929 Other chronic pain: Secondary | ICD-10-CM

## 2021-10-01 NOTE — Progress Notes (Signed)
? ?Office Visit Note ?  ?Patient: Patty Howard           ?Date of Birth: Jun 08, 1956           ?MRN: [id number] ?Visit Date: 10/01/2021 ?             ?Requested by: Buzzy Han, MD ?[place] ?[place],  [place] [address] ?PCP: Buzzy Han, MD ? ? ?Assessment & Plan: ?Visit Diagnoses:  ?1. Chronic bilateral low back pain, unspecified whether sciatica present   ?2. Trigger finger, left middle finger   ?3. Other intervertebral disc degeneration, lumbar region   ? ? ?Plan: Trigger finger injection performed left long finger with good relief.  She can follow-up with usAnd call in several weeks when she is feeling better and over her flu symptoms completely we can consider single epidural injection lumbar spine.  We discussed if this not effective then decompression surgery would be discussed and lateral flexion-extension lumbar images will be needed to determine whether she needs stabilization versus simple decompression. ? ?Follow-Up Instructions: No follow-ups on file.  ? ?Orders:  ?Orders Placed This Encounter  ?Procedures  ? XR Lumbar Spine 2-3 Views  ? ?No orders of the defined types were placed in this encounter. ? ? ? ? Procedures: ?No procedures performed ? ? ?Clinical Data: ?No additional findings. ? ? ?Subjective: ?Chief Complaint  ?Patient presents with  ? Lower Back - Pain  ? Left Middle Finger - Pain  ? ? ?HPI 66 year old female here stating she is having ongoing problems with arthritis back pain again.  States she has been trying to get into pain management pain radiates into her legs.  She has used [employer] and ice packs.  She has had some numbness and tingling.  Also had recent locking of her left middle finger and points to the A1 pulley where she has pain when this occurs.  Patient states she was in the hospital with flulike symptoms which was negative for COVID.  Past smoker has COPD with asthma and was having difficulty breathing.  She states she is doing better and  was discharged.  She does have prediabetes hypertension.  Previous radial head arthroplasty by May 2020.  Left total hip arthroplasty doing well.  Previous lumbar MRI showed some marked stenosis at L4-5 centrally.  Mild narrowing at other levels. ? ?Review of Systems all the systems noncontributory to HPI. ? ? ?Objective: ?Vital Signs: BP (!) 147/79   Pulse 96   Ht 5' 10"  (1.778 m)   Wt 270 lb (122.5 kg)   BMI 38.74 kg/m?  ? ?Physical Exam ?Constitutional:   ?   Appearance: She is well-developed.  ?HENT:  ?   Head: Normocephalic.  ?   Right Ear: External ear normal.  ?   Left Ear: External ear normal. There is no impacted cerumen.  ?Eyes:  ?   Pupils: Pupils are equal, round, and reactive to light.  ?Neck:  ?   Thyroid: No thyromegaly.  ?   Trachea: No tracheal deviation.  ?Cardiovascular:  ?   Rate and Rhythm: Normal rate.  ?Pulmonary:  ?   Effort: Pulmonary effort is normal.  ?Abdominal:  ?   Palpations: Abdomen is soft.  ?Musculoskeletal:  ?   Cervical back: No rigidity.  ?Skin: ?   General: Skin is warm and dry.  ?Neurological:  ?   Mental Status: She is alert and oriented to person, place, and time.  ?Psychiatric:     ?   Behavior: Behavior  normal.  ? ? ?Ortho Exam ? ?Specialty Comments:  ?No specialty comments available. ? ?Imaging: ?No results found. ? ? ?PMFS History: ?Patient Active Problem List  ? Diagnosis Date Noted  ? Trigger finger, left middle finger 10/02/2021  ? Hypokalemia 09/28/2021  ? COPD with acute exacerbation ([place]) 09/28/2021  ? Acute exacerbation of COPD with asthma ([place]) 09/27/2021  ? Severe sepsis ([place]) 09/27/2021  ? Other intervertebral disc degeneration, lumbar region 07/15/2021  ? History of total hip arthroplasty, left 03/07/2021  ? Trochanteric bursitis, left hip 12/23/2020  ? 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 Diastolic dysfunction 93/81/0175  ? Asthma exacerbation 11/13/2020  ? Menopause 11/08/2020  ? Influenza A 10/10/2020  ? Medication management 04/30/2020  ?  Gastroesophageal reflux disease 03/14/2020  ? OSA (obstructive sleep apnea) 02/15/2020  ? Abnormal findings on diagnostic imaging of lung 01/08/2020  ? Educated about COVID-19 virus infection 11/30/2019  ? Aortic atherosclerosis ([place]) 11/30/2019  ? Chest pain 11/26/2019  ? Acute asthma exacerbation 05/22/2019  ? Left radial head fracture 02/03/2019  ? Closed fracture of radial head 01/29/2019  ? Prediabetes 10/19/2018  ? 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) ([place]) 10/19/2018  ? Insomnia 10/19/2018  ? Elbow pain, chronic, left 10/19/2018  ? Dyspnea 10/12/2017  ? Pancolitis ([place]) 05/20/2017  ? H/O Spinal surgery 01/18/2015  ? Essential hypertension 01/18/2015  ? Obesity 01/18/2015  ? ?Past Medical History:  ?Diagnosis Date  ? Anxiety   ? Arthritis   ? Asthma   ? COPD (chronic obstructive pulmonary disease) ([place])   ? Fracture   ? closed displaced left radial head  ? GERD (gastroesophageal reflux disease)   ? Hypertension   ? Sleep apnea   ? does not wear CPAP  ?  ?Family History  ?Problem Relation Age of Onset  ? Hypertension Mother   ? Stroke Mother   ? Cancer Father   ?  ?Past Surgical History:  ?Procedure Laterality Date  ? BACK SURGERY    ? spinal stimulator  ? BREAST BIOPSY Right 2017  ? benign  ? COLONOSCOPY W/ BIOPSIES AND POLYPECTOMY    ? DILATION AND CURETTAGE OF UTERUS    ? LAPAROSCOPIC ROUX-EN-Y GASTRIC BYPASS WITH UPPER ENDOSCOPY AND REMOVAL OF LAP BAND    ? RADIAL HEAD ARTHROPLASTY Left 02/03/2019  ? Procedure: LEFT RADIAL HEAD ARTHROPLASTY;  Surgeon: [name], MD;  Location: [facility];  Service: Orthopedics;  Laterality: Left;  AXILLARY BLOCK VS BIER BLOCK  ? TOTAL HIP ARTHROPLASTY Left 01/22/2021  ? Procedure: LEFT TOTAL HIP ARTHROPLASTY ANTERIOR APPROACH;  Surgeon: [name], MD;  Location: [facility];  Service: Orthopedics;  Laterality: Left;  ? TUBAL LIGATION    ? ?Social History  ? ?Occupational History  ? Not on file  ?Tobacco Use  ? Smoking status: Former  ?  Packs/day: 0.50  ?   Years: 10.00  ?  Pack years: 5.00  ?  Types: Cigarettes  ?  Quit date: 06/15/1998  ?  Years since quitting: 23.3  ? Smokeless tobacco: Never  ?Vaping Use  ? Vaping Use: Never used  ?Substance and Sexual Activity  ? Alcohol use: Never  ? Drug use: Never  ? Sexual activity: Yes  ? ? ? ? ? ? ?

## 2021-10-02 ENCOUNTER — Ambulatory Visit (INDEPENDENT_AMBULATORY_CARE_PROVIDER_SITE_OTHER): Payer: Medicare Other

## 2021-10-02 DIAGNOSIS — M65332 Trigger finger, left middle finger: Secondary | ICD-10-CM

## 2021-10-02 DIAGNOSIS — J4550 Severe persistent asthma, uncomplicated: Secondary | ICD-10-CM

## 2021-10-02 HISTORY — DX: Trigger finger, left middle finger: M65.332

## 2021-10-02 LAB — CULTURE, BLOOD (ROUTINE X 2)
Culture: NO GROWTH
Culture: NO GROWTH
Special Requests: ADEQUATE

## 2021-10-07 ENCOUNTER — Ambulatory Visit: Payer: BC Managed Care – PPO

## 2021-10-10 ENCOUNTER — Ambulatory Visit (INDEPENDENT_AMBULATORY_CARE_PROVIDER_SITE_OTHER): Payer: Medicare Other | Admitting: Adult Health

## 2021-10-10 ENCOUNTER — Encounter: Payer: Self-pay | Admitting: Adult Health

## 2021-10-10 VITALS — BP 128/76 | HR 69 | Temp 98.6°F | Ht 69.0 in | Wt 281.0 lb

## 2021-10-10 DIAGNOSIS — J101 Influenza due to other identified influenza virus with other respiratory manifestations: Secondary | ICD-10-CM

## 2021-10-10 DIAGNOSIS — J4551 Severe persistent asthma with (acute) exacerbation: Secondary | ICD-10-CM | POA: Diagnosis not present

## 2021-10-10 DIAGNOSIS — J4550 Severe persistent asthma, uncomplicated: Secondary | ICD-10-CM | POA: Diagnosis not present

## 2021-10-10 DIAGNOSIS — G4733 Obstructive sleep apnea (adult) (pediatric): Secondary | ICD-10-CM | POA: Diagnosis not present

## 2021-10-10 DIAGNOSIS — R7303 Prediabetes: Secondary | ICD-10-CM

## 2021-10-10 DIAGNOSIS — K219 Gastro-esophageal reflux disease without esophagitis: Secondary | ICD-10-CM

## 2021-10-10 LAB — NITRIC OXIDE: FeNO level (ppb): 5

## 2021-10-10 MED ORDER — BENZONATATE 200 MG PO CAPS: 200.0000 mg | ORAL_CAPSULE | Freq: Three times a day (TID) | ORAL | 3 refills | Status: DC | PRN

## 2021-10-10 NOTE — Progress Notes (Signed)
? ?@Patient  ID: Patty Howard, female    DOB: May 25, 1956, 66 y.o.   MRN: [id number] ? ?Chief Complaint  ?Patient presents with  ? Follow-up  ? ? ?Referring provider: ?Buzzy Han* ? ?HPI: ?66 year old female former smoker followed for COPD with asthma, obstructive sleep apnea ?Medical history significant for hypertension GERD ? ?TEST/EVENTS :  ?PFTs show moderate obstruction with an FEV1 at 58%, ratio 69, FVC 66%, no significant bronchodilator response, DLCO 111%. ? ?Retired Engineer, structural ?No Pets  ?Carpet .  ?No hot tub or basement .  ?No unusual hobbies  ?Lives alone .  ?Drives independent.  ?No birds and chickens  ? ?10/10/2021 Follow up : COPD with Asthma ,. Post hospital follow-up, OSA  ?Presents for a posthospital follow-up.  Patient was recently admitted for COPD exacerbation with influenza A.  She was treated with aggressive bronchodilator therapy, IV steroids.  She also received Tamiflu x5 days.  Was treated with empiric antibiotics with doxycycline.  CT chest September 27, 2021 showed no acute process.  Scar noted in the left apex.  Negative for PE.  Since discharge patient says she is feeling some better but remains weak with ongoing cough and wheezing . Using OTC cough meds. Cough is keeping her up at night.  She was recently restarted back on prednisone by her primary care provider.  She denies any fever or discolored mucus.  No orthopnea or edema ?She remains on Trelegy inhaler daily.  She is on Singulair.  She has DuoNebs and albuterol inhaler at home.  Exhaled nitric oxide testing today is normal with a level at 5ppb (she is on prednisone) ?She is followed by allergist and is on Fasenra every 2 months.  Has been on for 2 years. Has decreased ER visits. However has received prednisone >6 times this past year.  ?High-resolution CT chest March 2022 showed no evidence of interstitial lung disease.,  Some mild postinfectious/inflammatory scarring in the lungs. ? ?Patient is prone to  recurrent asthmatic bronchitic exacerbations. ? ?Patient has underlying sleep apnea is on CPAP at bedtime. Says she can not use it, it is uncomfortable . Feels pressure is too high.  ? ?Allergies  ?Allergen Reactions  ? Banana Hives and Itching  ? ? ?Immunization History  ?Administered Date(s) Administered  ? Fluad Quad(high Dose 65+) 03/12/2021  ? Influenza, High Dose Seasonal PF 03/06/2019, 03/13/2019  ? Influenza,inj,Quad PF,6+ Mos 04/07/2018  ? Influenza-Unspecified 03/29/2017, 03/05/2020  ? Moderna SARS-COV2 Booster Vaccination 05/03/2020  ? Moderna Sars-Covid-2 Vaccination 08/19/2019, 09/14/2019  ? Pneumococcal Polysaccharide-23 03/29/2017  ? Tdap 10/30/2017  ? Zoster Recombinat (Shingrix) 03/30/2019  ? ? ?Past Medical History:  ?Diagnosis Date  ? Anxiety   ? Arthritis   ? Asthma   ? COPD (chronic obstructive pulmonary disease) ([place])   ? Fracture   ? closed displaced left radial head  ? GERD (gastroesophageal reflux disease)   ? Hypertension   ? Sleep apnea   ? does not wear CPAP  ? ? ?Tobacco History: ?Social History  ? ?Tobacco Use  ?Smoking Status Former  ? Packs/day: 0.50  ? Years: 10.00  ? Pack years: 5.00  ? Types: Cigarettes  ? Quit date: 06/15/1998  ? Years since quitting: 23.3  ?Smokeless Tobacco Never  ? ?Counseling given: Not Answered ? ? ?Outpatient Medications Prior to Visit  ?Medication Sig Dispense Refill  ? albuterol (PROVENTIL) (2.5 MG/3ML) 0.083% nebulizer solution Take 3 mLs (2.5 mg total) by nebulization every 6 (six) hours as needed for wheezing or shortness  of breath. 75 mL 1  ? albuterol (VENTOLIN HFA) 108 (90 Base) MCG/ACT inhaler Inhale 1-2 puffs into the lungs every 6 (six) hours as needed for wheezing or shortness of breath. 8 g 4  ? ascorbic acid (VITAMIN C) 500 MG tablet Take 500 mg by mouth daily.    ? atorvastatin (LIPITOR) 10 MG tablet Take 10 mg by mouth daily.    ? baclofen (LIORESAL) 10 MG tablet Take 10 mg by mouth 3 (three) times daily.    ? Benralizumab (FASENRA) 30 MG/ML  SOSY Inject 1 mL (30 mg total) into the skin every 8 (eight) weeks. 1 mL 6  ? dextromethorphan 15 MG/5ML syrup Take 10 mLs (30 mg total) by mouth 4 (four) times daily as needed for cough. 120 mL 0  ? diazepam (VALIUM) 5 MG tablet Take 0.5 tablets (2.5 mg total) by mouth every 6 (six) hours as needed for muscle spasms (spasms). 10 tablet 0  ? Fluticasone-Umeclidin-Vilant (TRELEGY ELLIPTA) 200-62.5-25 MCG/ACT AEPB Inhale 1 puff daily. (Patient taking differently: Inhale 1 puff into the lungs daily. Inhale 1 puff daily.) 28 each 5  ? ipratropium-albuterol (DUONEB) 0.5-2.5 (3) MG/3ML SOLN Take 3 mLs by nebulization every 6 (six) hours as needed. (Patient taking differently: Take 3 mLs by nebulization every 6 (six) hours as needed (Wheezing, shortness of breath).) 150 mL 2  ? levocetirizine (XYZAL) 5 MG tablet Take 1 tablet (5 mg total) by mouth every evening. 90 tablet 1  ? levothyroxine (SYNTHROID) 100 MCG tablet Take 100 mcg by mouth daily before breakfast.    ? Lido-Capsaicin-Men-Methyl Sal (MEDI-PATCH-LIDOCAINE) 0.5-0.035-5-20 % PTCH Apply 1 patch topically daily. 30 patch 0  ? losartan-hydrochlorothiazide (HYZAAR) 100-25 MG tablet Take 1 tablet by mouth daily. 90 tablet 3  ? montelukast (SINGULAIR) 10 MG tablet Take 1 tablet (10 mg total) by mouth at bedtime. 90 tablet 1  ? Multiple Vitamin (MULTIVITAMIN WITH MINERALS) TABS tablet Take 1 tablet by mouth daily. Centrum Multivitamin    ? OZEMPIC, 0.25 OR 0.5 MG/DOSE, 2 MG/1.5ML SOPN Inject 0.5 mg into the skin once a week. Mondays    ? pantoprazole (PROTONIX) 40 MG tablet TAKE 1 TABLET BY MOUTH EVERY DAY (Patient taking differently: Take 40 mg by mouth daily.) 90 tablet 1  ? potassium chloride SA (KLOR-CON M) 20 MEQ tablet Take 1 tablet (20 mEq total) by mouth daily. 30 tablet 0  ? predniSONE (DELTASONE) 10 MG tablet Take 4 tabs daily for 3 days, then 3 tabs daily for 3 days, then 2 tabs daily for 3 days, then 1 tab daily for 3 days 30 tablet 0  ? Vitamin D,  Ergocalciferol, (DRISDOL) 1.25 MG (50000 UNIT) CAPS capsule Take 50,000 Units by mouth every Monday.     ? amLODipine (NORVASC) 5 MG tablet TAKE 1 TABLET (5 MG TOTAL) BY MOUTH DAILY. 90 tablet 3  ? furosemide (LASIX) 40 MG tablet Take 1 tablet (40 mg total) by mouth daily as needed for fluid or edema (WEIGHT GAIN OF 2LBS, SHORTNESS OF BREATH). (Patient taking differently: Take 40 mg by mouth daily as needed for edema or fluid.) 30 tablet 3  ? ?Facility-Administered Medications Prior to Visit  ?Medication Dose Route Frequency Provider Last Rate Last Admin  ? Benralizumab SOSY 30 mg  30 mg Subcutaneous Q8 Toney Reil, MD   30 mg at 10/02/21 1031  ? ? ? ?Review of Systems:  ? ?Constitutional:   No  weight loss, night sweats,  Fevers, chills,  ?+fatigue,  or  lassitude. ? ?HEENT:   No headaches,  Difficulty swallowing,  Tooth/dental problems, or  Sore throat,  ?              No sneezing, itching, ear ache,  ?+nasal congestion, post nasal drip,  ? ?CV:  No chest pain,  Orthopnea, PND, swelling in lower extremities, anasarca, dizziness, palpitations, syncope.  ? ?GI  No heartburn, indigestion, abdominal pain, nausea, vomiting, diarrhea, change in bowel habits, loss of appetite, bloody stools.  ? ?Resp:   No chest wall deformity ? ?Skin: no rash or lesions. ? ?GU: no dysuria, change in color of urine, no urgency or frequency.  No flank pain, no hematuria  ? ?MS:  No joint pain or swelling.  No decreased range of motion.  No back pain. ? ? ? ?Physical Exam ? ?BP 128/76 (BP Location: Right Arm, Patient Position: Sitting)   Pulse 69   Temp 98.6 ?F (37 ?C) (Oral)   Ht 5' 9"  (1.753 m)   Wt 281 lb (127.5 kg)   SpO2 98%   BMI 41.50 kg/m?  ? ?GEN: A/Ox3; pleasant , NAD, well nourished  ?  ?HEENT:  [place]/AT,  , NOSE-clear, THROAT-clear, no lesions, no postnasal drip or exudate noted.  ? ?NECK:  Supple w/ fair ROM; no JVD; normal carotid impulses w/o bruits; no thyromegaly or nodules palpated; no lymphadenopathy.    ? ?RESP few trace rhonchi, speaks in full sentences.   no accessory muscle use, no dullness to percussion ? ?CARD:  RRR, no m/r/g, tr  peripheral edema, pulses intact, no cyanosis or clubbing. ? ?GI:   Soft & nt; nml bowel

## 2021-10-10 NOTE — Assessment & Plan Note (Signed)
Recent hospitalization with asthma flare.  Slow to resolve with ongoing postviral cough.  We will treat with trigger prevention and have her taper off of prednisone.  And begin cough control with Delsym and Tessalon and Chlor-Trimeton at bedtime ? ?Plan  ?Patient Instructions  ?Finish Prednisone taper . ?Continue on TRELEGY 1 puff daily ,rinse after use ?Continue on Singulair and Xyzal daily  ?Albuterol inhaler and neb As needed   ?Add Chlorpheniramine 56m At bedtime  for drainage As needed   ?Delsym 2 tsp Twice daily  for cough As needed   ?Tessalon Three times a day  for cough as needed  ?Restart CPAP At bedtime   ?Decrease CPAP pressure .  ?Follow up Dr. OAnder Sladein 4  weeks and As needed   ?Please contact office for sooner follow up if symptoms do not improve or worsen or seek emergency care  ? ? ?  ? ?

## 2021-10-10 NOTE — Assessment & Plan Note (Signed)
Continue on current regimen and trigger prevention ?

## 2021-10-10 NOTE — Assessment & Plan Note (Signed)
Educated on recurrent steroid use.  Patient is encouraged on a low sweet diet. ?Continue follow with primary care for ongoing management ?

## 2021-10-10 NOTE — Patient Instructions (Addendum)
Finish Prednisone taper . ?Continue on TRELEGY 1 puff daily ,rinse after use ?Continue on Singulair and Xyzal daily  ?Albuterol inhaler and neb As needed   ?Add Chlorpheniramine 57m At bedtime  for drainage As needed   ?Delsym 2 tsp Twice daily  for cough As needed   ?Tessalon Three times a day  for cough as needed  ?Restart CPAP At bedtime   ?Decrease CPAP pressure .  ?Follow up Dr. OAnder Sladein 4  weeks and As needed   ?Please contact office for sooner follow up if symptoms do not improve or worsen or seek emergency care  ? ? ?

## 2021-10-10 NOTE — Assessment & Plan Note (Signed)
Decrease CPAP pressure to 5 to 12cm  ?Encouraged on CPAP compliance  ?- discussed how weight can impact sleep and risk for sleep disordered breathing ?- discussed options to assist with weight loss: combination of diet modification, cardiovascular and strength training exercises ?  ?- had an extensive discussion regarding the adverse health consequences related to untreated sleep disordered breathing ?- specifically discussed the risks for hypertension, coronary artery disease, cardiac dysrhythmias, cerebrovascular disease, and diabetes ?- lifestyle modification discussed ?  ?- discussed how sleep disruption can increase risk of accidents, particularly when driving ?- safe driving practices were discussed ?  ?Plan  ?Patient Instructions  ?Finish Prednisone taper . ?Continue on TRELEGY 1 puff daily ,rinse after use ?Continue on Singulair and Xyzal daily  ?Albuterol inhaler and neb As needed   ?Add Chlorpheniramine 70m At bedtime  for drainage As needed   ?Delsym 2 tsp Twice daily  for cough As needed   ?Tessalon Three times a day  for cough as needed  ?Restart CPAP At bedtime   ?Decrease CPAP pressure .  ?Follow up Dr. OAnder Sladein 4  weeks and As needed   ?Please contact office for sooner follow up if symptoms do not improve or worsen or seek emergency care  ? ? ?  ? ?

## 2021-10-10 NOTE — Assessment & Plan Note (Signed)
Recurrent asthmatic bronchitic exacerbation most recent admission was aggravated by influenza A.  Despite an aggressive maintenance regimen with Trelegy, Singulair, Xyzal, Fasenra patient continues to have multiple flares throughout the year requiring steroid tapers. ?We will have her continue on her maintenance regimen.  Add in Chlor-Trimeton at bedtime.  Cough control regimen.  Continue follow-up with allergy.  Might need to consider changing Berna Bue if continues to have ongoing flares.  Exhaled nitric oxide testing today was low however patient is on prednisone ? ?Plan  ?Patient Instructions  ?Finish Prednisone taper . ?Continue on TRELEGY 1 puff daily ,rinse after use ?Continue on Singulair and Xyzal daily  ?Albuterol inhaler and neb As needed   ?Add Chlorpheniramine 53m At bedtime  for drainage As needed   ?Delsym 2 tsp Twice daily  for cough As needed   ?Tessalon Three times a day  for cough as needed  ?Restart CPAP At bedtime   ?Decrease CPAP pressure .  ?Follow up Dr. OAnder Sladein 4  weeks and As needed   ?Please contact office for sooner follow up if symptoms do not improve or worsen or seek emergency care  ? ? ? n ? ?

## 2021-10-15 ENCOUNTER — Encounter: Payer: BC Managed Care – PPO | Admitting: Physician Assistant

## 2021-10-15 NOTE — Progress Notes (Signed)
This encounter was created in error - please disregard.

## 2021-10-17 ENCOUNTER — Ambulatory Visit: Payer: BC Managed Care – PPO | Admitting: Nurse Practitioner

## 2021-10-20 ENCOUNTER — Encounter: Payer: Self-pay | Admitting: Physician Assistant

## 2021-10-21 ENCOUNTER — Ambulatory Visit (INDEPENDENT_AMBULATORY_CARE_PROVIDER_SITE_OTHER): Payer: Medicare Other | Admitting: Physician Assistant

## 2021-10-21 ENCOUNTER — Encounter: Payer: Self-pay | Admitting: Physician Assistant

## 2021-10-21 VITALS — BP 128/79 | HR 79 | Ht 70.0 in | Wt 282.8 lb

## 2021-10-21 DIAGNOSIS — R0609 Other forms of dyspnea: Secondary | ICD-10-CM | POA: Diagnosis not present

## 2021-10-21 DIAGNOSIS — I5032 Chronic diastolic (congestive) heart failure: Secondary | ICD-10-CM

## 2021-10-21 DIAGNOSIS — E785 Hyperlipidemia, unspecified: Secondary | ICD-10-CM

## 2021-10-21 DIAGNOSIS — I10 Essential (primary) hypertension: Secondary | ICD-10-CM

## 2021-10-21 DIAGNOSIS — E1169 Type 2 diabetes mellitus with other specified complication: Secondary | ICD-10-CM | POA: Diagnosis not present

## 2021-10-21 MED ORDER — LOSARTAN POTASSIUM-HCTZ 100-25 MG PO TABS: 1.0000 | ORAL_TABLET | Freq: Every day | ORAL | 3 refills | Status: DC

## 2021-10-21 MED ORDER — AMLODIPINE BESYLATE 5 MG PO TABS: 5.0000 mg | ORAL_TABLET | Freq: Every day | ORAL | 3 refills | Status: DC

## 2021-10-21 NOTE — Progress Notes (Signed)
?Cardiology Office Note:   ? ?Date:  10/21/2021  ? ?ID:  Patty Howard, DOB 05-27-1956, MRN [id number] ? ?PCP:  Buzzy Han, MD (Inactive) ?  ?[facility] Providers ?Cardiologist:  Minus Breeding, MD ?Cardiology APP:  Ledora Bottcher, [place] { ?Referring MD: No ref. provider found  ? ?Chief Complaint  ?Patient presents with  ? Hospitalization Follow-up  ?  DOE  ? ? ?History of Present Illness:   ? ?Patty Howard is a 66 y.o. female with a hx of OSA on CPAP, asthma, and COPD.  She established care with Dr. Percival Spanish 12/01/2019 for the evaluation of shortness of breath and chest pain.  This was following a 2-day hospitalization prior to this clinic visit for the same.  She was treated for HFpEF.  Echocardiogram 11/2019 with an LVEF 65 to 15%, grade 1 diastolic dysfunction, no regional wall motion abnormality, and no significant valvular disease.  Nuclear stress test was negative for ischemia.  Of note aortic atherosclerosis was appreciated on CTA in 2021.  Pulmonary nodules found on CT followed by pulmonary. ? ?She was hospitalized April 2023 with a COPD exacerbation secondary to influenza A infection.  She was treated with Tamiflu and doxycycline with improvement in her wheezing.  She was discharged with a prednisone taper. ? ?She has not been seen by cardiology since December 2021.  She presents for follow-up.  She does have a history of nonadherence with her medications.  She continues to take Lasix as needed. ? ?She used to be a Engineer, structural in [place]. ? ?She presents for follow up. She is out of all medications. She has asthma and has DOE. She  follows with asthma and allergy and pulmonology. She hasn't been to the gym in 6 weeks due to [place].  ? ?Dry weight is 281 lbs. She is following with weight management program.  ? ?Past Medical History:  ?Diagnosis Date  ? Anxiety   ? Arthritis   ? Asthma   ? COPD (chronic obstructive pulmonary disease) ([place])   ? Fracture   ? closed displaced  left radial head  ? GERD (gastroesophageal reflux disease)   ? Hypertension   ? Sleep apnea   ? does not wear CPAP  ? ? ?Past Surgical History:  ?Procedure Laterality Date  ? BACK SURGERY    ? spinal stimulator  ? BREAST BIOPSY Right 2017  ? benign  ? COLONOSCOPY W/ BIOPSIES AND POLYPECTOMY    ? DILATION AND CURETTAGE OF UTERUS    ? LAPAROSCOPIC ROUX-EN-Y GASTRIC BYPASS WITH UPPER ENDOSCOPY AND REMOVAL OF LAP BAND    ? RADIAL HEAD ARTHROPLASTY Left 02/03/2019  ? Procedure: LEFT RADIAL HEAD ARTHROPLASTY;  Surgeon: [name], MD;  Location: [facility];  Service: Orthopedics;  Laterality: Left;  AXILLARY BLOCK VS BIER BLOCK  ? TOTAL HIP ARTHROPLASTY Left 01/22/2021  ? Procedure: LEFT TOTAL HIP ARTHROPLASTY ANTERIOR APPROACH;  Surgeon: [name], MD;  Location: [facility];  Service: Orthopedics;  Laterality: Left;  ? TUBAL LIGATION    ? ? ?Current Medications: ?Current Meds  ?Medication Sig  ? albuterol (PROVENTIL) (2.5 MG/3ML) 0.083% nebulizer solution Take 3 mLs (2.5 mg total) by nebulization every 6 (six) hours as needed for wheezing or shortness of breath.  ? albuterol (VENTOLIN HFA) 108 (90 Base) MCG/ACT inhaler Inhale 1-2 puffs into the lungs every 6 (six) hours as needed for wheezing or shortness of breath.  ? ascorbic acid (VITAMIN C) 500 MG tablet Take 500 mg by mouth  daily.  ? atorvastatin (LIPITOR) 10 MG tablet Take 10 mg by mouth daily.  ? baclofen (LIORESAL) 10 MG tablet Take 10 mg by mouth 3 (three) times daily.  ? Benralizumab (FASENRA) 30 MG/ML SOSY Inject 1 mL (30 mg total) into the skin every 8 (eight) weeks.  ? diazepam (VALIUM) 5 MG tablet Take 0.5 tablets (2.5 mg total) by mouth every 6 (six) hours as needed for muscle spasms (spasms).  ? Diethylpropion HCl CR 75 MG TB24 1 tablet in the mid morning  ? Fluticasone-Umeclidin-Vilant (TRELEGY ELLIPTA) 200-62.5-25 MCG/ACT AEPB Inhale 1 puff daily. (Patient taking differently: Inhale 1 puff into the lungs daily. Inhale 1 puff daily.)  ? ipratropium-albuterol  (DUONEB) 0.5-2.5 (3) MG/3ML SOLN Take 3 mLs by nebulization every 6 (six) hours as needed. (Patient taking differently: Take 3 mLs by nebulization every 6 (six) hours as needed (Wheezing, shortness of breath).)  ? levocetirizine (XYZAL) 5 MG tablet Take 1 tablet (5 mg total) by mouth every evening.  ? levothyroxine (SYNTHROID) 100 MCG tablet Take 100 mcg by mouth daily before breakfast.  ? Lido-Capsaicin-Men-Methyl Sal (MEDI-PATCH-LIDOCAINE) 0.5-0.035-5-20 % PTCH Apply 1 patch topically daily.  ? montelukast (SINGULAIR) 10 MG tablet Take 1 tablet (10 mg total) by mouth at bedtime.  ? Multiple Vitamin (MULTIVITAMIN WITH MINERALS) TABS tablet Take 1 tablet by mouth daily. Centrum Multivitamin  ? OZEMPIC, 0.25 OR 0.5 MG/DOSE, 2 MG/1.5ML SOPN Inject 0.5 mg into the skin once a week. Mondays  ? pantoprazole (PROTONIX) 40 MG tablet TAKE 1 TABLET BY MOUTH EVERY DAY (Patient taking differently: Take 40 mg by mouth daily.)  ? potassium chloride SA (KLOR-CON M) 20 MEQ tablet Take 1 tablet (20 mEq total) by mouth daily.  ? Vitamin D, Ergocalciferol, (DRISDOL) 1.25 MG (50000 UNIT) CAPS capsule Take 50,000 Units by mouth every Monday.   ? [DISCONTINUED] amLODipine (NORVASC) 5 MG tablet TAKE 1 TABLET (5 MG TOTAL) BY MOUTH DAILY.  ? [DISCONTINUED] losartan-hydrochlorothiazide (HYZAAR) 100-25 MG tablet Take 1 tablet by mouth daily.  ? ?Current Facility-Administered Medications for the 10/21/21 encounter (Office Visit) with Ledora Bottcher, [place]  ?Medication  ? Benralizumab SOSY 30 mg  ?  ? ?Allergies:   Banana  ? ?Social History  ? ?Socioeconomic History  ? Marital status: Widowed  ?  Spouse name: Not on file  ? Number of children: Not on file  ? Years of education: Not on file  ? Highest education level: Not on file  ?Occupational History  ? Not on file  ?Tobacco Use  ? Smoking status: Former  ?  Packs/day: 0.50  ?  Years: 10.00  ?  Pack years: 5.00  ?  Types: Cigarettes  ?  Quit date: 06/15/1998  ?  Years since quitting: 23.3  ?  Smokeless tobacco: Never  ?Vaping Use  ? Vaping Use: Never used  ?Substance and Sexual Activity  ? Alcohol use: Never  ? Drug use: Never  ? Sexual activity: Yes  ?Other Topics Concern  ? Not on file  ?Social History Narrative  ? Three children.  Engineer, structural in [place].    ? ?Social Determinants of Health  ? ?Financial Resource Strain: Not on file  ?Food Insecurity: Not on file  ?Transportation Needs: Not on file  ?Physical Activity: Not on file  ?Stress: Not on file  ?Social Connections: Not on file  ?  ? ?Family History: ?The patient's family history includes Cancer in her father; Hypertension in her mother; Stroke in her mother. ? ?ROS:   ?  Please see the history of present illness.    ? All other systems reviewed and are negative. ? ?EKGs/Labs/Other Studies Reviewed:   ? ?The following studies were reviewed today: ? ?Echo 11/2019 ? 1. Left ventricular ejection fraction, by estimation, is 65 to 70%. The  ?left ventricle has normal function. The left ventricle has no regional  ?wall motion abnormalities. There is mild left ventricular hypertrophy.  ?Left ventricular diastolic parameters  ?are consistent with Grade I diastolic dysfunction (impaired relaxation).  ? 2. Right ventricular systolic function is normal. The right ventricular  ?size is normal.  ? 3. The mitral valve is normal in structure. No evidence of mitral valve  ?regurgitation.  ? 4. The aortic valve is grossly normal. Aortic valve regurgitation is not  ?visualized.  ? ? ?Nuclear stress test 12/2019 ?The left ventricular ejection fraction is hyperdynamic (>65%). ?Nuclear stress EF: 65%. ?There was no ST segment deviation noted during stress. ?No T wave inversion was noted during stress. ?The study is normal. ?This is a low risk study. ?  ?1. Slightly reduced counts in the apex on rest and stress with normal wall motion consistent with apical thinning artifact.  ?2. Normal myocardial perfusion imaging study without ischemia or infarction.  ?3. TID ratio is  upper limits of normal for pharmacological stress. Visually, there is no TID present. Suspect artifactual due to small LV cavity.  ?4. Normal LVEF, 65%.  ?5. This is a low-risk study.   ? ? ? ? ?EKG:

## 2021-10-21 NOTE — Patient Instructions (Addendum)
Medication Instructions:  ?STOP Lasix  ? ?*If you need a refill on your cardiac medications before your next appointment, please call your pharmacy* ? ?Lab Work: ?Your physician recommends that you return for lab work TODAY:  ?LDL Direct  ?Fasting Lipid  ?A1c ?BMP ?Hepatic Function  ? ?If you have labs (blood work) drawn today and your tests are completely normal, you will receive your results only by: ?MyChart Message (if you have MyChart) OR ?A paper copy in the mail ?If you have any lab test that is abnormal or we need to change your treatment, we will call you to review the results. ? ?Testing/Procedures: ?Your physician has requested that you have an echocardiogram. Echocardiography is a painless test that uses sound waves to create images of your heart. It provides your doctor with information about the size and shape of your heart and how well your heart?s chambers and valves are working. This procedure takes approximately one hour. There are no restrictions for this procedure. ? ?Follow-Up: ?At [facility], you and your health needs are our priority.  As part of our continuing mission to provide you with exceptional heart care, we have created designated Provider Care Teams.  These Care Teams include your primary Cardiologist (physician) and Advanced Practice Providers (APPs -  Physician Assistants and Nurse Practitioners) who all work together to provide you with the care you need, when you need it.  ? ?Your next appointment:   ?3-4 month(s) ? ?The format for your next appointment:   ?In Person ? ?Provider:   ?Minus Breeding, MD  or Fabian Sharp, PA-C      ? ? ?Other Instructions ? ? ?Important Information About Sugar ? ? ? ? ? ? ?

## 2021-10-22 LAB — BASIC METABOLIC PANEL
BUN/Creatinine Ratio: 16 (ref 12–28)
BUN: 13 mg/dL (ref 8–27)
CO2: 23 mmol/L (ref 20–29)
Calcium: 10.2 mg/dL (ref 8.7–10.3)
Chloride: 105 mmol/L (ref 96–106)
Creatinine, Ser: 0.79 mg/dL (ref 0.57–1.00)
Glucose: 81 mg/dL (ref 70–99)
Potassium: 4.2 mmol/L (ref 3.5–5.2)
Sodium: 143 mmol/L (ref 134–144)
eGFR: 82 mL/min/{1.73_m2} (ref >59)

## 2021-10-22 LAB — HEMOGLOBIN A1C
Est. average glucose Bld gHb Est-mCnc: 126 mg/dL
Hgb A1c MFr Bld: 6 % — ABNORMAL HIGH (ref 4.8–5.6)

## 2021-10-22 LAB — LIPID PANEL
Chol/HDL Ratio: 2.6 ratio (ref 0.0–4.4)
Cholesterol, Total: 197 mg/dL (ref 100–199)
HDL: 77 mg/dL (ref >39)
LDL Chol Calc (NIH): 110 mg/dL — ABNORMAL HIGH (ref 0–99)
Triglycerides: 56 mg/dL (ref 0–149)
VLDL Cholesterol Cal: 10 mg/dL (ref 5–40)

## 2021-10-22 LAB — HEPATIC FUNCTION PANEL
ALT: 24 IU/L (ref 0–32)
AST: 22 IU/L (ref 0–40)
Albumin: 4.5 g/dL (ref 3.8–4.8)
Alkaline Phosphatase: 105 IU/L (ref 44–121)
Bilirubin Total: 0.2 mg/dL (ref 0.0–1.2)
Bilirubin, Direct: <0.1 mg/dL (ref 0.00–0.40)
Total Protein: 6.9 g/dL (ref 6.0–8.5)

## 2021-10-22 LAB — LDL CHOLESTEROL, DIRECT: LDL Direct: 107 mg/dL — ABNORMAL HIGH (ref 0–99)

## 2021-10-23 MED ORDER — ATORVASTATIN CALCIUM 20 MG PO TABS: 20.0000 mg | ORAL_TABLET | Freq: Every day | ORAL | 3 refills | Status: DC

## 2021-10-23 NOTE — Addendum Note (Signed)
Addended by: Minette Brine on: 10/23/2021 05:19 PM ? ? Modules accepted: Orders ? ?

## 2021-10-27 ENCOUNTER — Other Ambulatory Visit (HOSPITAL_BASED_OUTPATIENT_CLINIC_OR_DEPARTMENT_OTHER): Payer: Self-pay

## 2021-10-27 ENCOUNTER — Emergency Department (HOSPITAL_BASED_OUTPATIENT_CLINIC_OR_DEPARTMENT_OTHER)
Admission: EM | Admit: 2021-10-27 | Discharge: 2021-10-27 | Disposition: A | Discharge status: Home / Self Care | Payer: Medicare Other | Attending: Emergency Medicine | Admitting: Emergency Medicine

## 2021-10-27 ENCOUNTER — Emergency Department (HOSPITAL_BASED_OUTPATIENT_CLINIC_OR_DEPARTMENT_OTHER): Payer: Medicare Other | Admitting: Radiology

## 2021-10-27 ENCOUNTER — Other Ambulatory Visit: Payer: Self-pay

## 2021-10-27 ENCOUNTER — Encounter (HOSPITAL_BASED_OUTPATIENT_CLINIC_OR_DEPARTMENT_OTHER): Payer: Self-pay

## 2021-10-27 ENCOUNTER — Ambulatory Visit: Payer: BC Managed Care – PPO

## 2021-10-27 DIAGNOSIS — J449 Chronic obstructive pulmonary disease, unspecified: Secondary | ICD-10-CM | POA: Insufficient documentation

## 2021-10-27 DIAGNOSIS — Z79899 Other long term (current) drug therapy: Secondary | ICD-10-CM | POA: Insufficient documentation

## 2021-10-27 DIAGNOSIS — J45901 Unspecified asthma with (acute) exacerbation: Secondary | ICD-10-CM | POA: Diagnosis not present

## 2021-10-27 DIAGNOSIS — I10 Essential (primary) hypertension: Secondary | ICD-10-CM | POA: Insufficient documentation

## 2021-10-27 DIAGNOSIS — R062 Wheezing: Secondary | ICD-10-CM | POA: Diagnosis present

## 2021-10-27 MED ORDER — PREDNISONE 20 MG PO TABS
60.0000 mg | ORAL_TABLET | Freq: Every day | ORAL | 0 refills | Status: AC
  Filled 2021-10-27: qty 15, 5d supply, fill #0

## 2021-10-27 MED ORDER — ALBUTEROL SULFATE HFA 108 (90 BASE) MCG/ACT IN AERS: 6.0000 | INHALATION_SPRAY | Freq: Once | RESPIRATORY_TRACT | Status: DC

## 2021-10-27 MED ORDER — AZITHROMYCIN 250 MG PO TABS
250.0000 mg | ORAL_TABLET | Freq: Every day | ORAL | 0 refills | Status: DC
  Filled 2021-10-27: qty 6, 5d supply, fill #0

## 2021-10-27 MED ORDER — IPRATROPIUM-ALBUTEROL 0.5-2.5 (3) MG/3ML IN SOLN
RESPIRATORY_TRACT | Status: AC
  Administered 2021-10-27: 3 mL
  Filled 2021-10-27: qty 3

## 2021-10-27 MED ORDER — IPRATROPIUM-ALBUTEROL 0.5-2.5 (3) MG/3ML IN SOLN: 3.0000 mL | Freq: Once | RESPIRATORY_TRACT | Status: AC

## 2021-10-27 MED ORDER — ALBUTEROL SULFATE HFA 108 (90 BASE) MCG/ACT IN AERS: 2.0000 | INHALATION_SPRAY | RESPIRATORY_TRACT | Status: DC | PRN

## 2021-10-27 MED ORDER — PREDNISONE 50 MG PO TABS
60.0000 mg | ORAL_TABLET | Freq: Once | ORAL | Status: AC
  Administered 2021-10-27: 60 mg via ORAL
  Filled 2021-10-27: qty 1

## 2021-10-27 MED ORDER — IPRATROPIUM BROMIDE HFA 17 MCG/ACT IN AERS: 2.0000 | INHALATION_SPRAY | Freq: Once | RESPIRATORY_TRACT | Status: DC

## 2021-10-27 MED ORDER — ALBUTEROL SULFATE (2.5 MG/3ML) 0.083% IN NEBU
INHALATION_SOLUTION | RESPIRATORY_TRACT | Status: AC
  Administered 2021-10-27: 2.5 mg
  Filled 2021-10-27: qty 3

## 2021-10-27 MED ORDER — ALBUTEROL SULFATE (2.5 MG/3ML) 0.083% IN NEBU
2.5000 mg | INHALATION_SOLUTION | Freq: Once | RESPIRATORY_TRACT | Status: AC
Start: 2021-10-27 — End: 2021-10-27

## 2021-10-27 NOTE — ED Provider Notes (Signed)
?[facility] ?Provider Note ? ? ?CSN: [id number] ?Arrival date & time: 10/27/21  4098 ? ?  ? ?History ? ?Chief Complaint  ?Patient presents with  ? Shortness of Breath  ? Cough  ? Wheezing  ? ? ?Patty Howard is a 65 y.o. female. ? ?Patient here with shortness of breath, wheezing, cough.  Recent admission for respiratory failure and influenza.  Continues to have cough, wheezing.  Breathing treatments at home have not helped much.  Nothing makes it worse or better.  Denies any fevers or chills.  Denies any sputum production.  Denies any chest pain, abdominal pain, nausea, vomiting.  Symptoms for the last several days.  No recent travel.  No recent surgery. ? ? ? ?  ? ?Home Medications ?Prior to Admission medications   ?Medication Sig Start Date End Date Taking? Authorizing Provider  ?azithromycin (ZITHROMAX) 250 MG tablet Take 1 tablet (250 mg total) by mouth daily. Take first 2 tablets together, then 1 every day until finished. 10/27/21  Yes Temesha Queener, DO  ?predniSONE (DELTASONE) 20 MG tablet Take 3 tablets (60 mg total) by mouth daily for 5 days. 10/27/21 11/01/21 Yes Nicodemus Denk, DO  ?albuterol (PROVENTIL) (2.5 MG/3ML) 0.083% nebulizer solution Take 3 mLs (2.5 mg total) by nebulization every 6 (six) hours as needed for wheezing or shortness of breath. 04/23/21   Sigurd Sos, MD  ?albuterol (VENTOLIN HFA) 108 (90 Base) MCG/ACT inhaler Inhale 1-2 puffs into the lungs every 6 (six) hours as needed for wheezing or shortness of breath. 06/03/21   Valentina Shaggy, MD  ?amLODipine (NORVASC) 5 MG tablet Take 1 tablet (5 mg total) by mouth daily. 10/21/21   Duke, Tami Lin, PA  ?ascorbic acid (VITAMIN C) 500 MG tablet Take 500 mg by mouth daily.    [provider]  ?atorvastatin (LIPITOR) 20 MG tablet Take 1 tablet (20 mg total) by mouth daily. 10/23/21   Duke, Tami Lin, PA  ?baclofen (LIORESAL) 10 MG tablet Take 10 mg by mouth 3 (three) times daily. 04/08/21    [provider]  ?Benralizumab (FASENRA) 30 MG/ML SOSY Inject 1 mL (30 mg total) into the skin every 8 (eight) weeks. 07/15/21   Valentina Shaggy, MD  ?benzonatate (TESSALON) 200 MG capsule Take 1 capsule (200 mg total) by mouth 3 (three) times daily as needed for cough. ?Patient not taking: Reported on 10/21/2021 10/10/21 10/10/22  Parrett, Fonnie Mu, NP  ?dextromethorphan 15 MG/5ML syrup Take 10 mLs (30 mg total) by mouth 4 (four) times daily as needed for cough. ?Patient not taking: Reported on 10/21/2021 09/30/21   Barb Merino, MD  ?diazepam (VALIUM) 5 MG tablet Take 0.5 tablets (2.5 mg total) by mouth every 6 (six) hours as needed for muscle spasms (spasms). 04/03/21   Mesner, Corene Cornea, MD  ?Diethylpropion HCl CR 75 MG TB24 1 tablet in the mid morning 10/16/21   [provider]  ?Fluticasone-Umeclidin-Vilant (TRELEGY ELLIPTA) 200-62.5-25 MCG/ACT AEPB Inhale 1 puff daily. ?Patient taking differently: Inhale 1 puff into the lungs daily. Inhale 1 puff daily. 04/23/21   Sigurd Sos, MD  ?ipratropium-albuterol (DUONEB) 0.5-2.5 (3) MG/3ML SOLN Take 3 mLs by nebulization every 6 (six) hours as needed. ?Patient taking differently: Take 3 mLs by nebulization every 6 (six) hours as needed (Wheezing, shortness of breath). 04/23/21   Sigurd Sos, MD  ?levocetirizine (XYZAL) 5 MG tablet Take 1 tablet (5 mg total) by mouth every evening. 04/23/21   Sigurd Sos, MD  ?levothyroxine (SYNTHROID) 100 MCG  tablet Take 100 mcg by mouth daily before breakfast.    [provider]  ?Lido-Capsaicin-Men-Methyl Sal (MEDI-PATCH-LIDOCAINE) 0.5-0.035-5-20 % PTCH Apply 1 patch topically daily. 04/03/21   Mesner, Corene Cornea, MD  ?losartan-hydrochlorothiazide (HYZAAR) 100-25 MG tablet Take 1 tablet by mouth daily. 10/21/21   Duke, Tami Lin, PA  ?montelukast (SINGULAIR) 10 MG tablet Take 1 tablet (10 mg total) by mouth at bedtime. 04/23/21   Sigurd Sos, MD  ?Multiple Vitamin (MULTIVITAMIN WITH MINERALS) TABS tablet Take 1  tablet by mouth daily. Centrum Multivitamin    [provider]  ?OZEMPIC, 0.25 OR 0.5 MG/DOSE, 2 MG/1.5ML SOPN Inject 0.5 mg into the skin once a week. Mondays 06/12/21   [provider]  ?pantoprazole (PROTONIX) 40 MG tablet TAKE 1 TABLET BY MOUTH EVERY DAY ?Patient taking differently: Take 40 mg by mouth daily. 06/17/20   Laurin Coder, MD  ?potassium chloride SA (KLOR-CON M) 20 MEQ tablet Take 1 tablet (20 mEq total) by mouth daily. 09/30/21 10/30/21  Barb Merino, MD  ?Vitamin D, Ergocalciferol, (DRISDOL) 1.25 MG (50000 UNIT) CAPS capsule Take 50,000 Units by mouth every Monday.  10/27/19   [provider]  ?   ? ?Allergies    ?Banana   ? ?Review of Systems   ?Review of Systems ? ?Physical Exam ?Updated Vital Signs ?BP (!) 157/94   Pulse 98   Temp 98.7 ?F (37.1 ?C)   Resp (!) 22   SpO2 97%  ?Physical Exam ?Vitals and nursing note reviewed.  ?Constitutional:   ?   General: She is not in acute distress. ?   Appearance: She is well-developed. She is not ill-appearing.  ?HENT:  ?   Head: Normocephalic and atraumatic.  ?Eyes:  ?   Extraocular Movements: Extraocular movements intact.  ?   Conjunctiva/sclera: Conjunctivae normal.  ?   Pupils: Pupils are equal, round, and reactive to light.  ?Cardiovascular:  ?   Rate and Rhythm: Normal rate and regular rhythm.  ?   Heart sounds: No murmur heard. ?Pulmonary:  ?   Effort: Pulmonary effort is normal. No respiratory distress.  ?   Breath sounds: Wheezing present.  ?Abdominal:  ?   Palpations: Abdomen is soft.  ?   Tenderness: There is no abdominal tenderness.  ?Musculoskeletal:     ?   General: No swelling.  ?   Cervical back: Normal range of motion and neck supple.  ?   Right lower leg: No edema.  ?   Left lower leg: No edema.  ?Skin: ?   General: Skin is warm and dry.  ?   Capillary Refill: Capillary refill takes less than 2 seconds.  ?Neurological:  ?   Mental Status: She is alert.  ?Psychiatric:     ?   Mood and Affect: Mood normal.   ? ? ?ED Results / Procedures / Treatments   ?Labs ?(all labs ordered are listed, but only abnormal results are displayed) ?Labs Reviewed - No data to display ? ?EKG ?EKG Interpretation ? ?Date/Time:  Monday Oct 27 2021 09:05:25 EDT ?Ventricular Rate:  89 ?PR Interval:  146 ?QRS Duration: 89 ?QT Interval:  371 ?QTC Calculation: 452 ?R Axis:   68 ?Text Interpretation: Sinus rhythm Atrial premature complex Left atrial enlargement Confirmed by Ronnald Nian, Latisha Lasch ([id number]) on 10/27/2021 9:08:01 AM ? ?Radiology ?DG Chest 2 View ? ?Result Date: 10/27/2021 ?CLINICAL DATA:  Short of breath EXAM: CHEST - 2 VIEW COMPARISON:  09/27/2021 FINDINGS: The heart size and mediastinal contours are within normal  limits. Both lungs are clear. No pleural effusion or pneumothorax. The visualized skeletal structures are unremarkable. IMPRESSION: No acute process in the chest. Electronically Signed   By: Macy Mis M.D.   On: 10/27/2021 08:58   ? ?Procedures ?Procedures  ? ? ?Medications Ordered in ED ?Medications  ?predniSONE (DELTASONE) tablet 60 mg (60 mg Oral Given 10/27/21 0915)  ?albuterol (PROVENTIL) (2.5 MG/3ML) 0.083% nebulizer solution 2.5 mg (2.5 mg Nebulization Given 10/27/21 0855)  ?ipratropium-albuterol (DUONEB) 0.5-2.5 (3) MG/3ML nebulizer solution 3 mL (3 mLs Nebulization Given 10/27/21 0854)  ? ? ?ED Course/ Medical Decision Making/ A&P ?  ?                        ?Medical Decision Making ?Amount and/or Complexity of Data Reviewed ?Radiology: ordered. ? ?Risk ?Prescription drug management. ? ? ?Emilya Justen is here with cough and wheezing.  Normal vitals.  No fever.  No signs of respiratory distress.  History of COPD/asthma.  History of hypertension.  Has diffuse wheezing on exam.  Has had cough but no sputum production.  Differential diagnosis is asthma/COPD exacerbation, pneumonia.  Have no concern for ACS or PE other acute cardiac or pulmonary process.  We will get chest x-ray, give albuterol and Atrovent treatment.  Will  give prednisone. ? ?Chest x-ray per my review and interpretation shows no obvious pneumonia.  Overall suspect COPD exacerbation.  Will treat with steroids, Z-Pak.  She is feeling much better after breathing treat

## 2021-10-27 NOTE — ED Triage Notes (Signed)
Pt presnts POV with 1 week of SOB, non productive cough and wheezing. Pt coughing in triage with congestion but unable to cough it up. Pt reports recently admitted with flu and feels she is not getting better  ?

## 2021-10-27 NOTE — Discharge Instructions (Signed)
Keep using your inhaler every 4 hours or as needed.  Take next dose of steroid tomorrow.  Take your first dose antibiotic when you get home. ?

## 2021-10-28 ENCOUNTER — Ambulatory Visit: Payer: BC Managed Care – PPO | Admitting: Orthopaedic Surgery

## 2021-10-30 ENCOUNTER — Ambulatory Visit: Payer: BC Managed Care – PPO

## 2021-10-30 ENCOUNTER — Ambulatory Visit
Admission: RE | Admit: 2021-10-30 | Discharge: 2021-10-30 | Disposition: A | Discharge status: Home / Self Care | Payer: Medicare Other | Source: Ambulatory Visit | Attending: Internal Medicine | Admitting: Internal Medicine

## 2021-10-30 DIAGNOSIS — Z1231 Encounter for screening mammogram for malignant neoplasm of breast: Secondary | ICD-10-CM

## 2021-11-04 ENCOUNTER — Ambulatory Visit (INDEPENDENT_AMBULATORY_CARE_PROVIDER_SITE_OTHER): Payer: Medicare Other | Admitting: Family Medicine

## 2021-11-04 ENCOUNTER — Encounter (HOSPITAL_BASED_OUTPATIENT_CLINIC_OR_DEPARTMENT_OTHER): Payer: Self-pay | Admitting: Family Medicine

## 2021-11-04 VITALS — BP 146/85 | HR 91 | Ht 69.0 in | Wt 279.6 lb

## 2021-11-04 DIAGNOSIS — E119 Type 2 diabetes mellitus without complications: Secondary | ICD-10-CM | POA: Diagnosis not present

## 2021-11-04 DIAGNOSIS — I10 Essential (primary) hypertension: Secondary | ICD-10-CM

## 2021-11-04 DIAGNOSIS — J449 Chronic obstructive pulmonary disease, unspecified: Secondary | ICD-10-CM

## 2021-11-04 MED ORDER — TRELEGY ELLIPTA 200-62.5-25 MCG/ACT IN AEPB: INHALATION_SPRAY | RESPIRATORY_TRACT | 5 refills | Status: DC

## 2021-11-04 MED ORDER — SEMAGLUTIDE (1 MG/DOSE) 4 MG/3ML ~~LOC~~ SOPN: 1.0000 mg | PEN_INJECTOR | SUBCUTANEOUS | 0 refills | Status: DC

## 2021-11-04 NOTE — Progress Notes (Signed)
New Patient Office Visit  Subjective    Patient ID: Patty Howard, female    DOB: Feb 09, 1956  Age: 66 y.o. MRN: [id number]  CC:  Chief Complaint  Patient presents with  . New Patient (Initial Visit)    HPI Patty Howard presents to establish care Last PCP - Dr. Jenetta Downer [facility] [address] Follows with Pulmonology, Orthopedics, Cardiology, Allergist  HFpEF, HTN:   DM:   HLD:   Hypothyroidism: levothyroxine 100 mcg, has been on this dose for about 1 year  Follows with orthopedist for chronic ow back pain  Patient is originally from [facility], has been living here since 2019. 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 Has a daughter living locally who is going to law school, started recently. She enjoys walking (not as much recently with pulm issues), cooking.  Outpatient Encounter Medications as of 11/04/2021  Medication Sig  . albuterol (PROVENTIL) (2.5 MG/3ML) 0.083% nebulizer solution Take 3 mLs (2.5 mg total) by nebulization every 6 (six) hours as needed for wheezing or shortness of breath.  Marland Kitchen albuterol (VENTOLIN HFA) 108 (90 Base) MCG/ACT inhaler Inhale 1-2 puffs into the lungs every 6 (six) hours as needed for wheezing or shortness of breath.  Marland Kitchen amLODipine (NORVASC) 5 MG tablet Take 1 tablet (5 mg total) by mouth daily.  Marland Kitchen ascorbic acid (VITAMIN C) 500 MG tablet Take 500 mg by mouth daily.  Marland Kitchen atorvastatin (LIPITOR) 20 MG tablet Take 1 tablet (20 mg total) by mouth daily.  Marland Kitchen azithromycin (ZITHROMAX) 250 MG tablet Take 1 tablet (250 mg total) by mouth daily. Take first 2 tablets together, then 1 every day until finished.  . baclofen (LIORESAL) 10 MG tablet Take 10 mg by mouth 3 (three) times daily.  . Benralizumab (FASENRA) 30 MG/ML SOSY Inject 1 mL (30 mg total) into the skin every 8 (eight) weeks.  . benzonatate (TESSALON) 200 MG capsule Take 1 capsule (200 mg total) by mouth 3 (three) times daily as needed for cough.  . dextromethorphan 15  MG/5ML syrup Take 10 mLs (30 mg total) by mouth 4 (four) times daily as needed for cough.  . diazepam (VALIUM) 5 MG tablet Take 0.5 tablets (2.5 mg total) by mouth every 6 (six) hours as needed for muscle spasms (spasms).  . Diethylpropion HCl CR 75 MG TB24 1 tablet in the mid morning  . Fluticasone-Umeclidin-Vilant (TRELEGY ELLIPTA) 200-62.5-25 MCG/ACT AEPB Inhale 1 puff daily. (Patient taking differently: Inhale 1 puff into the lungs daily. Inhale 1 puff daily.)  . ipratropium-albuterol (DUONEB) 0.5-2.5 (3) MG/3ML SOLN Take 3 mLs by nebulization every 6 (six) hours as needed. (Patient taking differently: Take 3 mLs by nebulization every 6 (six) hours as needed (Wheezing, shortness of breath).)  . levocetirizine (XYZAL) 5 MG tablet Take 1 tablet (5 mg total) by mouth every evening.  Marland Kitchen levothyroxine (SYNTHROID) 100 MCG tablet Take 100 mcg by mouth daily before breakfast.  . Lido-Capsaicin-Men-Methyl Sal (MEDI-PATCH-LIDOCAINE) 0.5-0.035-5-20 % PTCH Apply 1 patch topically daily.  Marland Kitchen losartan-hydrochlorothiazide (HYZAAR) 100-25 MG tablet Take 1 tablet by mouth daily.  . montelukast (SINGULAIR) 10 MG tablet Take 1 tablet (10 mg total) by mouth at bedtime.  . Multiple Vitamin (MULTIVITAMIN WITH MINERALS) TABS tablet Take 1 tablet by mouth daily. Centrum Multivitamin  . OZEMPIC, 0.25 OR 0.5 MG/DOSE, 2 MG/1.5ML SOPN Inject 0.5 mg into the skin once a week. Mondays  . pantoprazole (PROTONIX) 40 MG tablet TAKE 1 TABLET BY MOUTH EVERY DAY (Patient taking differently:  Take 40 mg by mouth daily.)  . Vitamin D, Ergocalciferol, (DRISDOL) 1.25 MG (50000 UNIT) CAPS capsule Take 50,000 Units by mouth every Monday.   . potassium chloride SA (KLOR-CON M) 20 MEQ tablet Take 1 tablet (20 mEq total) by mouth daily.   Facility-Administered Encounter Medications as of 11/04/2021  Medication  . Benralizumab SOSY 30 mg    Past Medical History:  Diagnosis Date  . Anxiety   . Arthritis   . Asthma   . COPD (chronic  obstructive pulmonary disease) ([place])   . Fracture    closed displaced left radial head  . GERD (gastroesophageal reflux disease)   . Hypertension   . Sleep apnea    does not wear CPAP    Past Surgical History:  Procedure Laterality Date  . BACK SURGERY     spinal stimulator  . BREAST BIOPSY Right 2017   benign  . COLONOSCOPY W/ BIOPSIES AND POLYPECTOMY    . DILATION AND CURETTAGE OF UTERUS    . LAPAROSCOPIC ROUX-EN-Y GASTRIC BYPASS WITH UPPER ENDOSCOPY AND REMOVAL OF LAP BAND    . RADIAL HEAD ARTHROPLASTY Left 02/03/2019   Procedure: LEFT RADIAL HEAD ARTHROPLASTY;  Surgeon: [name], MD;  Location: [facility];  Service: Orthopedics;  Laterality: Left;  AXILLARY BLOCK VS BIER BLOCK  . TOTAL HIP ARTHROPLASTY Left 01/22/2021   Procedure: LEFT TOTAL HIP ARTHROPLASTY ANTERIOR APPROACH;  Surgeon: [name], MD;  Location: [facility];  Service: Orthopedics;  Laterality: Left;  . TUBAL LIGATION      Family History  Problem Relation Age of Onset  . Hypertension Mother   . Stroke Mother   . Cancer Father     Social History   Socioeconomic History  . Marital status: Widowed    Spouse name: Not on file  . Number of children: Not on file  . Years of education: Not on file  . Highest education level: Not on file  Occupational History  . Not on file  Tobacco Use  . Smoking status: Former    Packs/day: 0.50    Years: 10.00    Pack years: 5.00    Types: Cigarettes    Quit date: 06/15/1998    Years since quitting: 23.4  . Smokeless tobacco: Never  Vaping Use  . Vaping Use: Never used  Substance and Sexual Activity  . Alcohol use: Never  . Drug use: Never  . Sexual activity: Yes  Other Topics Concern  . Not on file  Social History Narrative   Three children.  Engineer, structural in [place].     Social Determinants of Health   Financial Resource Strain: Not on file  Food Insecurity: Not on file  Transportation Needs: Not on file  Physical Activity: Not on file  Stress: Not on file   Social Connections: Not on file  Intimate Partner Violence: Not on file    Objective    BP (!) 146/85   Pulse 91   Ht 5' 9"  (1.753 m)   Wt 279 lb 9.6 oz (126.8 kg)   SpO2 97%   BMI 41.29 kg/m   Physical Exam  65  Assessment & Plan:   Problem List Items Addressed This Visit   None   No follow-ups on file.   Chrysten Woulfe J [place], MD

## 2021-11-05 DIAGNOSIS — E119 Type 2 diabetes mellitus without complications: Secondary | ICD-10-CM | POA: Insufficient documentation

## 2021-11-05 HISTORY — DX: Type 2 diabetes mellitus without complications: E11.9

## 2021-11-05 NOTE — Assessment & Plan Note (Signed)
Most recent hemoglobin A1c We will titrate dose of Ozempic, patient did not tolerate metformin in the past has not had prior urine microalbumin/creatinine ratio checked, will need to complete in the future Plan for follow-up in about 1 month to assess progress with slightly higher dose of Ozempic

## 2021-11-05 NOTE — Assessment & Plan Note (Signed)
Recommend continued close follow-up with pulmonologist Did refill Trelegy Ellipta for patient today

## 2021-11-05 NOTE — Assessment & Plan Note (Signed)
Blood pressure slightly above goal in office today Recommend intermittent monitoring at home We will continue with current medications, may need to consider adjustment in the future Recommend DASH diet

## 2021-11-07 ENCOUNTER — Encounter: Payer: Self-pay | Admitting: *Deleted

## 2021-11-11 ENCOUNTER — Telehealth: Payer: Self-pay | Admitting: Cardiology

## 2021-11-11 ENCOUNTER — Ambulatory Visit (HOSPITAL_COMMUNITY): Payer: Medicare Other | Attending: Cardiovascular Disease

## 2021-11-11 DIAGNOSIS — R0609 Other forms of dyspnea: Secondary | ICD-10-CM | POA: Insufficient documentation

## 2021-11-11 LAB — ECHOCARDIOGRAM COMPLETE
Area-P 1/2: 2.94 cm2
S' Lateral: 3 cm

## 2021-11-11 NOTE — Telephone Encounter (Signed)
Pt states they are returning call in regards to results. Req call back.

## 2021-11-11 NOTE — Telephone Encounter (Signed)
Left message to call back  

## 2021-11-18 ENCOUNTER — Ambulatory Visit: Payer: BC Managed Care – PPO | Admitting: Orthopaedic Surgery

## 2021-11-20 NOTE — Telephone Encounter (Signed)
Left message for pt to call.

## 2021-11-26 NOTE — Telephone Encounter (Signed)
Left message to call back  

## 2021-11-26 NOTE — Telephone Encounter (Signed)
Patient is returning call. Please advise.

## 2021-11-26 NOTE — Telephone Encounter (Signed)
LMTCB

## 2021-11-27 ENCOUNTER — Ambulatory Visit (INDEPENDENT_AMBULATORY_CARE_PROVIDER_SITE_OTHER): Payer: Medicare Other | Admitting: Allergy & Immunology

## 2021-11-27 ENCOUNTER — Encounter: Payer: Self-pay | Admitting: Allergy & Immunology

## 2021-11-27 ENCOUNTER — Ambulatory Visit: Payer: Medicare Other

## 2021-11-27 VITALS — BP 130/88 | HR 85 | Temp 96.8°F | Ht 69.0 in | Wt 284.6 lb

## 2021-11-27 DIAGNOSIS — J4550 Severe persistent asthma, uncomplicated: Secondary | ICD-10-CM | POA: Diagnosis not present

## 2021-11-27 DIAGNOSIS — K219 Gastro-esophageal reflux disease without esophagitis: Secondary | ICD-10-CM | POA: Diagnosis not present

## 2021-11-27 DIAGNOSIS — J449 Chronic obstructive pulmonary disease, unspecified: Secondary | ICD-10-CM

## 2021-11-27 MED ORDER — MONTELUKAST SODIUM 10 MG PO TABS: 10.0000 mg | ORAL_TABLET | Freq: Every day | ORAL | 1 refills | Status: DC

## 2021-11-27 MED ORDER — PANTOPRAZOLE SODIUM 40 MG PO TBEC: 40.0000 mg | DELAYED_RELEASE_TABLET | Freq: Every day | ORAL | 1 refills | Status: DC

## 2021-11-27 MED ORDER — TRELEGY ELLIPTA 200-62.5-25 MCG/ACT IN AEPB: INHALATION_SPRAY | RESPIRATORY_TRACT | 5 refills | Status: DC

## 2021-11-27 MED ORDER — ALBUTEROL SULFATE HFA 108 (90 BASE) MCG/ACT IN AERS: 1.0000 | INHALATION_SPRAY | Freq: Four times a day (QID) | RESPIRATORY_TRACT | 4 refills | Status: DC | PRN

## 2021-11-27 MED ORDER — ZAFIRLUKAST 20 MG PO TABS: 20.0000 mg | ORAL_TABLET | Freq: Two times a day (BID) | ORAL | 3 refills | Status: DC

## 2021-11-27 NOTE — Telephone Encounter (Signed)
Pt updated and verbalized understanding.    Ledora Bottcher, [place]  11/13/2021 12:47 PM EDT     Your heart ultrasound shows a strong squeeze function with some mild stiffening of the heart muscle that can happen with age. No significant valvular disease. This would suggest your breathing problems are likely related to underlying lung issues instead of cardiac issues.

## 2021-11-27 NOTE — Telephone Encounter (Signed)
Pt returning nurses call regarding results. Call transferred

## 2021-11-27 NOTE — Progress Notes (Signed)
FOLLOW UP  Date of Service/Encounter:  11/27/21   Assessment:   Asthma-COPD overlap syndrome - with spirometry improving since the last visit    Perennial and seasonal allergic rhinitis (dust mites, molds)   Gastroesophageal reflux disease   Diastolic heart failure - on Lasix    Morbid obesity  [name] presents for a follow-up visit.  She has been requiring much more prednisone over the last 6 to 9 months than is typical for her.  This raises the suspicion that Patty Howard is no longer controlling her symptoms as much as it was previously.  I think we are going to work on changing to [place] to see if this will provide better control of her symptoms.  There are some preliminary studies showing improvement in COPD symptoms and decrease in COPD exacerbations with the use of Dupixent.  Plan/Recommendations:   1. Severe persistent asthma, uncomplicated - Lung testing looked stable today.  - We will look into starting Dupixent in lieu of Fasenra to see if this helps more.  - We will stop the Singulair and start zafirlukast twice daily instead.  - We will check with you in 3 months and see how things are going.  - Daily controller medication(s): Trelegy 217mg one puff once daily + zafirlukast 245mtwice daily + Fasenra every 8 weeks (to be replaced with Dupixent) - Prior to physical activity: albuterol 2 puffs 10-15 minutes before physical activity. - Rescue medications: albuterol 4 puffs every 4-6 hours as needed, albuterol nebulizer one vial every 4-6 hours as needed or DuoNeb nebulizer one vial every 4-6 hours as needed - Asthma control goals:  * Full participation in all desired activities (may need albuterol before activity) * Albuterol use two time or less a week on average (not counting use with activity) * Cough interfering with sleep two time or less a month * Oral steroids no more than once a year * No hospitalizations  2. Chronic rhinitis (dust mites, molds) - Continue  with Flonase one spray per nostril twice daily to  - Continue with Xyzal (levocetirizine) 5m32maily to control any postnasal drip.   3. Recurrent infections - Labs have been reassuring.  - I do not think that we need to do more of a workup at this point.   4. Gastroesophageal reflux disease  - Continue with Protonix 65m59mce daily to control any silent reflux.  5. Return in about 3 months (around 02/27/2022).   Subjective:   Patty Howard 65 y52. female presenting today for follow up of No chief complaint on file.   Patty Howard a history of the following: Patient Active Problem List   Diagnosis Date Noted   Type 2 diabetes mellitus without complication, without long-term current use of insulin (HCC)[place]/24/2023   Trigger finger, left middle finger 10/02/2021   Hypokalemia 09/28/2021   COPD with acute exacerbation (HCC)[place]/16/2023   Acute exacerbation of COPD with asthma (HCC)[place]/15/2023   Severe sepsis (HCC)[place]/15/2023   Other intervertebral disc degeneration, lumbar region 07/15/2021   History of total hip arthroplasty, left 03/07/2021   Trochanteric bursitis, left hip 12/23/2020   Tracheobronchomalacia 06/129/79/8921iastolic dysfunction 06/119/41/7408sthma exacerbation 11/13/2020   Menopause 11/08/2020   Influenza A 10/10/2020   Medication management 04/30/2020   Gastroesophageal reflux disease 03/14/2020   OSA (obstructive sleep apnea) 02/15/2020   Abnormal findings on diagnostic imaging of lung 01/08/2020   Educated about COVID-19 virus infection 11/30/2019  Aortic atherosclerosis ([place]) 11/30/2019   Chest pain 11/26/2019   Acute asthma exacerbation 05/22/2019   Left radial head fracture 02/03/2019   Closed fracture of radial head 01/29/2019   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) ([place]) 10/19/2018   Insomnia 10/19/2018   Elbow pain, chronic, left 10/19/2018   Dyspnea 10/12/2017   Pancolitis ([place]) 05/20/2017   H/O Spinal  surgery 01/18/2015   Essential hypertension 01/18/2015   Obesity 01/18/2015    History obtained from: chart review and patient.  Patty Howard is a 66 y.o. female presenting for a follow up visit.  She was last seen by me in December 2022.  At that time, she was continuing to have coughing from a prolonged viral illness.  We did not give additional steroids or antibiotics.  We felt that some of her symptoms might be related to uncontrolled reflux due to her lackluster use of her PPI.  We added on Carafate 3 times a day for a couple of weeks and famotidine 40 mg twice a day for a couple of weeks to see if this would help.  We did get a chest x-ray which was reassuring.  We continue with her prednisone that she was already on as well as [employer].  Since the last visit, she has done well. She did catch something ;last week when she was on vacation in [place]. She got prednisone and antibiotics (sounds like azithromycin). Her last dose of prednisone is in two days.   She had an ED visits in May 2023. Then she had steroids last week as well. She had influenza in April 2023 with a hospitalization and steroids.   Asthma/Respiratory Symptom History: She remains on her Trelegy one puff once daily. She is on Singulair daily . She does the DuoNeb three times daily to open her lungs up. This was recommended by Dr. Hermina Staggers. Her next appointment with him is pending.    Allergic Rhinitis Symptom History: She remains on her Flonase and Xyzal for her allergic rhinitis.  She has not needed antibiotics-this current round of azithromycin that she is on for her COPD exacerbation.  Overall, she feels like her allergic rhinitis is under fair control.  Otherwise, there have been no changes to her past medical history, surgical history, family history, or social history.    Review of Systems  Constitutional: Negative.  Negative for fever, malaise/fatigue and weight loss.  HENT: Negative.  Negative for  congestion, ear discharge, ear pain and sinus pain.   Eyes:  Negative for pain, discharge and redness.  Respiratory:  Positive for cough and shortness of breath. Negative for sputum production and wheezing.   Cardiovascular: Negative.  Negative for chest pain and palpitations.  Gastrointestinal:  Negative for abdominal pain, constipation, diarrhea, heartburn, nausea and vomiting.  Skin: Negative.  Negative for itching and rash.  Neurological:  Negative for dizziness and headaches.  Endo/Heme/Allergies:  Negative for environmental allergies. Does not bruise/bleed easily.       Objective:   Blood pressure 130/88, pulse 85, temperature (!) 96.8 F (36 C), height 5' 9"  (1.753 m), weight 284 lb 9.6 oz (129.1 kg), SpO2 98 %. Body mass index is 42.03 kg/m.    Physical Exam Vitals reviewed.  Constitutional:      Appearance: She is well-developed.     Comments: Speaking in full sentences without a problem.  HENT:     Head: Normocephalic and atraumatic.     Right Ear: Tympanic membrane, ear canal and external ear normal.  Left Ear: Tympanic membrane, ear canal and external ear normal.     Nose: Rhinorrhea present. No nasal deformity, septal deviation or mucosal edema.     Right Turbinates: Enlarged and swollen.     Left Turbinates: Enlarged.     Right Sinus: No maxillary sinus tenderness or frontal sinus tenderness.     Left Sinus: No maxillary sinus tenderness or frontal sinus tenderness.     Mouth/Throat:     Mouth: Mucous membranes are not pale and not dry.     Pharynx: Uvula midline.  Eyes:     General: Lids are normal. No allergic shiner.       Right eye: No discharge.        Left eye: No discharge.     Conjunctiva/sclera: Conjunctivae normal.     Right eye: Right conjunctiva is not injected. No chemosis.    Left eye: Left conjunctiva is not injected. No chemosis.    Pupils: Pupils are equal, round, and reactive to light.  Cardiovascular:     Rate and Rhythm: Normal rate  and regular rhythm.     Heart sounds: Normal heart sounds.  Pulmonary:     Effort: Pulmonary effort is normal. No tachypnea, accessory muscle usage or respiratory distress.     Breath sounds: Examination of the right-upper field reveals wheezing. Examination of the right-middle field reveals wheezing. Examination of the right-lower field reveals wheezing. Wheezing and rhonchi present. No rales.     Comments: Moving air well in all lung fields.  No increased work of breathing.  Might be some very faint end expiratory wheezes on the right side of her lungs. Chest:     Chest wall: No tenderness.  Lymphadenopathy:     Cervical: No cervical adenopathy.  Skin:    General: Skin is warm.     Capillary Refill: Capillary refill takes less than 2 seconds.     Coloration: Skin is not pale.     Findings: No abrasion, erythema, petechiae or rash. Rash is not papular, urticarial or vesicular.  Neurological:     Mental Status: She is alert.  Psychiatric:        Behavior: Behavior is cooperative.      Diagnostic studies:    Spirometry: results abnormal (FEV1: 1.11/48%, FVC: 1.63/55%, FEV1/FVC: 68%).    Spirometry consistent with mixed obstructive and restrictive disease.   Allergy Studies: none       Salvatore Marvel, MD  [facility]

## 2021-11-27 NOTE — Patient Instructions (Addendum)
1. Severe persistent asthma, uncomplicated - Lung testing looked stable today.  - We will look into starting Dupixent in lieu of Fasenra to see if this helps more.  - We will stop the Singulair and start zafirlukast twice daily instead.  - We will check with you in 3 months and see how things are going.  - Daily controller medication(s): Trelegy 220mg one puff once daily + zafirlukast 266mtwice daily + Fasenra every 8 weeks (to be replaced with Dupixent) - Prior to physical activity: albuterol 2 puffs 10-15 minutes before physical activity. - Rescue medications: albuterol 4 puffs every 4-6 hours as needed, albuterol nebulizer one vial every 4-6 hours as needed or DuoNeb nebulizer one vial every 4-6 hours as needed - Asthma control goals:  * Full participation in all desired activities (may need albuterol before activity) * Albuterol use two time or less a week on average (not counting use with activity) * Cough interfering with sleep two time or less a month * Oral steroids no more than once a year * No hospitalizations  2. Chronic rhinitis (dust mites, molds) - Continue with Flonase one spray per nostril twice daily to  - Continue with Xyzal (levocetirizine) 10m43maily to control any postnasal drip.   3. Recurrent infections - Labs have been reassuring.  - I do not think that we need to do more of a workup at this point.   4. Gastroesophageal reflux disease  - Continue with Protonix 64m87mce daily to control any silent reflux.  5. Return in about 3 months (around 02/27/2022).    Please inform us oKoreaany Emergency Department visits, hospitalizations, or changes in symptoms. Call us bKoreaore going to the ED for breathing or allergy symptoms since we might be able to fit you in for a sick visit. Feel free to contact us aKoreatime with any questions, problems, or concerns.  It was a pleasure to see you again today!  Websites that have reliable patient information: 1. American Academy of  Asthma, Allergy, and Immunology: www.aaaai.org 2. Food Allergy Research and Education (FARE): foodallergy.org 3. Mothers of Asthmatics: http://www.asthmacommunitynetwork.org 4. American College of Allergy, Asthma, and Immunology: www.acaai.org   COVID-19 Vaccine Information can be found at: [email or web address] questions related to vaccine distribution or appointments, please email [email or web address] or call [phone]  "Like" us oKoreaFacebook and Instagram for our latest updates!        Make sure you are registered to vote! If you have moved or change d any of your contact information, you will need to get this updated before voting!  In some cases, you MAY be able to register to vote online: [email or web address]

## 2021-11-28 ENCOUNTER — Telehealth: Payer: Self-pay | Admitting: *Deleted

## 2021-11-28 NOTE — Telephone Encounter (Signed)
Called patient and advised approval for Dupixent and will mail patient assistance consent and submit for free drug from [place] [facility] like we did for [facility]

## 2021-11-28 NOTE — Telephone Encounter (Signed)
Great - thank you!   Salvatore Marvel, MD [facility]

## 2021-11-28 NOTE — Telephone Encounter (Signed)
-----   Message from Valentina Shaggy, MD sent at 11/27/2021  1:31 PM EDT ----- Change to [place]?

## 2021-12-02 ENCOUNTER — Ambulatory Visit: Payer: Medicare Other | Admitting: Pulmonary Disease

## 2021-12-05 ENCOUNTER — Other Ambulatory Visit (HOSPITAL_BASED_OUTPATIENT_CLINIC_OR_DEPARTMENT_OTHER): Payer: Self-pay | Admitting: Family Medicine

## 2021-12-05 ENCOUNTER — Encounter (HOSPITAL_BASED_OUTPATIENT_CLINIC_OR_DEPARTMENT_OTHER): Payer: Self-pay | Admitting: Family Medicine

## 2021-12-05 ENCOUNTER — Ambulatory Visit (INDEPENDENT_AMBULATORY_CARE_PROVIDER_SITE_OTHER): Payer: Medicare Other | Admitting: Family Medicine

## 2021-12-05 VITALS — BP 122/78 | HR 86 | Ht 69.0 in | Wt 279.0 lb

## 2021-12-05 DIAGNOSIS — R059 Cough, unspecified: Secondary | ICD-10-CM

## 2021-12-05 DIAGNOSIS — J449 Chronic obstructive pulmonary disease, unspecified: Secondary | ICD-10-CM | POA: Diagnosis not present

## 2021-12-05 DIAGNOSIS — E119 Type 2 diabetes mellitus without complications: Secondary | ICD-10-CM | POA: Diagnosis not present

## 2021-12-05 DIAGNOSIS — R051 Acute cough: Secondary | ICD-10-CM | POA: Diagnosis not present

## 2021-12-05 HISTORY — DX: Cough, unspecified: R05.9

## 2021-12-05 MED ORDER — SEMAGLUTIDE (1 MG/DOSE) 4 MG/3ML ~~LOC~~ SOPN: 1.0000 mg | PEN_INJECTOR | SUBCUTANEOUS | 1 refills | Status: DC

## 2021-12-05 MED ORDER — BENZONATATE 200 MG PO CAPS: 200.0000 mg | ORAL_CAPSULE | Freq: Three times a day (TID) | ORAL | 0 refills | Status: DC | PRN

## 2021-12-05 MED ORDER — TRELEGY ELLIPTA 200-62.5-25 MCG/ACT IN AEPB: INHALATION_SPRAY | RESPIRATORY_TRACT | 5 refills | Status: DC

## 2021-12-05 NOTE — Assessment & Plan Note (Signed)
Indicates that she has had a recent cough over the last couple weeks.  She was evaluated for this and was prescribed steroids as well as antibiotic.  Generally she has been doing better but continues to have a slight cough.  Denies any fevers, chills, sweats.  No significant dyspnea above baseline. On exam, patient is in no acute distress, vital signs stable.  Lungs with slightly diminished breath sounds, intermittent rhonchi, consistent with baseline lung exam. Suspect that current cough is related to postinfectious etiology.  Discussed conservative measures including OTC options, honey.  We will also send in for [facility] for patient to utilize.  Discussed that would expect gradual improvement over the coming weeks

## 2021-12-08 ENCOUNTER — Encounter: Payer: Self-pay | Admitting: *Deleted

## 2021-12-24 ENCOUNTER — Encounter: Payer: Self-pay | Admitting: *Deleted

## 2021-12-26 ENCOUNTER — Ambulatory Visit (INDEPENDENT_AMBULATORY_CARE_PROVIDER_SITE_OTHER): Payer: Medicare Other | Admitting: Surgery

## 2021-12-26 ENCOUNTER — Encounter: Payer: Self-pay | Admitting: Surgery

## 2021-12-26 ENCOUNTER — Ambulatory Visit: Payer: Self-pay

## 2021-12-26 VITALS — BP 131/81 | HR 80 | Ht 69.0 in | Wt 279.0 lb

## 2021-12-26 DIAGNOSIS — M545 Low back pain, unspecified: Secondary | ICD-10-CM

## 2021-12-26 DIAGNOSIS — G8929 Other chronic pain: Secondary | ICD-10-CM

## 2021-12-26 DIAGNOSIS — M4316 Spondylolisthesis, lumbar region: Secondary | ICD-10-CM

## 2021-12-26 NOTE — Progress Notes (Signed)
Office Visit Note   Patient: Patty Howard           Date of Birth: 03-Dec-1955           MRN: [id number] Visit Date: 12/26/2021              Requested by: de [place], Raymond J, MD [address] [place],  [place] [address] PCP: de [place], Raymond J, MD   Assessment & Plan: Visit Diagnoses:  1. Chronic bilateral low back pain, unspecified whether sciatica present   2. Spondylolisthesis, lumbar region     Plan: With patient's ongoing low back pain I will schedule her for consult with Dr. Ernestina Patches to discuss possible scheduling of lumbar ESI's.  I would like her to follow-up with Dr. Lorin Mercy in 5 weeks for recheck and hopefully she has had a consult and at least 1 injection by that time.  She would like to exhaust all conservative measures before proceeding with surgery.  Follow-Up Instructions: Return in about 5 years (around 12/27/2026) for with dr yates recheck lumbar after ESI.   Orders:  Orders Placed This Encounter  Procedures   Ambulatory referral to Physical Medicine Rehab   No orders of the defined types were placed in this encounter.     Procedures: No procedures performed   Clinical Data: No additional findings.   Subjective: Chief Complaint  Patient presents with   Lower Back - Pain    HPI 66 year old black female returns with complaints of back pain and right lower extremity radiculopathy.  Last seen by Dr. Lorin Mercy about 3 months ago.  States that symptoms unchanged.  He continues have ongoing pain in the low back that radiates down the right leg with numbness.  States that she has not had any ESI's. Review of Systems No current cardiopulmonary GI/GU issues  Objective: Vital Signs: BP 131/81   Pulse 80   Ht 5' 9"  (1.753 m)   Wt 279 lb (126.6 kg)   BMI 41.20 kg/m   Physical Exam HENT:     Head: Normocephalic and atraumatic.     Nose: Nose normal.  Eyes:     Extraocular Movements: Extraocular movements intact.  Musculoskeletal:     Comments:  Right-sided lumbar paraspinal tenderness/spasm.  Negative logroll bilateral hips.  Positive right straight leg raise.  No focal motor deficits.  Neurological:     Mental Status: She is alert and oriented to person, place, and time.  Psychiatric:        Mood and Affect: Mood normal.     Ortho Exam  Specialty Comments:  No specialty comments available.  Imaging: No results found.   PMFS History: Patient Active Problem List   Diagnosis Date Noted   Cough 12/05/2021   Type 2 diabetes mellitus without complication, without long-term current use of insulin ([place]) 11/05/2021   Trigger finger, left middle finger 10/02/2021   Hypokalemia 09/28/2021   COPD with acute exacerbation ([place]) 09/28/2021   Acute exacerbation of COPD with asthma ([place]) 09/27/2021   Severe sepsis ([place]) 09/27/2021   Other intervertebral disc degeneration, lumbar region 07/15/2021   History of total hip arthroplasty, left 03/07/2021   Trochanteric bursitis, left hip 12/23/2020   Tracheobronchomalacia 76/28/3151   Diastolic dysfunction 76/16/0737   Asthma exacerbation 11/13/2020   Menopause 11/08/2020   Influenza A 10/10/2020   Medication management 04/30/2020   Gastroesophageal reflux disease 03/14/2020   OSA (obstructive sleep apnea) 02/15/2020   Abnormal findings on diagnostic imaging of lung 01/08/2020   Educated about  COVID-19 virus infection 11/30/2019   Aortic atherosclerosis ([place]) 11/30/2019   Chest pain 11/26/2019   Acute asthma exacerbation 05/22/2019   Left radial head fracture 02/03/2019   Closed fracture of radial head 01/29/2019   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) ([place]) 10/19/2018   Insomnia 10/19/2018   Elbow pain, chronic, left 10/19/2018   Dyspnea 10/12/2017   Pancolitis ([place]) 05/20/2017   H/O Spinal surgery 01/18/2015   Essential hypertension 01/18/2015   Obesity 01/18/2015   Past Medical History:  Diagnosis Date   Anxiety    Arthritis    Asthma    COPD  (chronic obstructive pulmonary disease) ([place])    Fracture    closed displaced left radial head   GERD (gastroesophageal reflux disease)    Hypertension    Sleep apnea    does not wear CPAP    Family History  Problem Relation Age of Onset   Hypertension Mother    Stroke Mother    Cancer Father     Past Surgical History:  Procedure Laterality Date   BACK SURGERY     spinal stimulator   BREAST BIOPSY Right 2017   benign   COLONOSCOPY W/ BIOPSIES AND POLYPECTOMY     DILATION AND CURETTAGE OF UTERUS     LAPAROSCOPIC ROUX-EN-Y GASTRIC BYPASS WITH UPPER ENDOSCOPY AND REMOVAL OF LAP BAND     RADIAL HEAD ARTHROPLASTY Left 02/03/2019   Procedure: LEFT RADIAL HEAD ARTHROPLASTY;  Surgeon: [name], MD;  Location: [facility];  Service: Orthopedics;  Laterality: Left;  AXILLARY BLOCK VS BIER BLOCK   TOTAL HIP ARTHROPLASTY Left 01/22/2021   Procedure: LEFT TOTAL HIP ARTHROPLASTY ANTERIOR APPROACH;  Surgeon: [name], MD;  Location: [facility];  Service: Orthopedics;  Laterality: Left;   TUBAL LIGATION     Social History   Occupational History   Not on file  Tobacco Use   Smoking status: Former    Packs/day: 0.50    Years: 10.00    Total pack years: 5.00    Types: Cigarettes    Quit date: 06/15/1998    Years since quitting: 23.5   Smokeless tobacco: Never  Vaping Use   Vaping Use: Never used  Substance and Sexual Activity   Alcohol use: Never   Drug use: Never   Sexual activity: Yes

## 2022-01-01 ENCOUNTER — Ambulatory Visit (INDEPENDENT_AMBULATORY_CARE_PROVIDER_SITE_OTHER): Payer: Medicare Other | Admitting: Adult Health

## 2022-01-01 ENCOUNTER — Encounter: Payer: Self-pay | Admitting: Adult Health

## 2022-01-01 DIAGNOSIS — G4733 Obstructive sleep apnea (adult) (pediatric): Secondary | ICD-10-CM | POA: Diagnosis not present

## 2022-01-01 DIAGNOSIS — J449 Chronic obstructive pulmonary disease, unspecified: Secondary | ICD-10-CM

## 2022-01-01 MED ORDER — BENZONATATE 200 MG PO CAPS: 200.0000 mg | ORAL_CAPSULE | Freq: Three times a day (TID) | ORAL | 2 refills | Status: DC | PRN

## 2022-01-01 NOTE — Assessment & Plan Note (Signed)
Improved control on current regimen. Continue to control for triggers  Plan  Patient Instructions  Continue on TRELEGY 1 puff daily ,rinse after use Continue on Accolate .  Albuterol inhaler and neb As needed   Chlorpheniramine 75m At bedtime  for drainage As needed   Delsym 2 tsp Twice daily  for cough As needed   Tessalon Three times a day  for cough as needed  Restart CPAP At bedtime, get DME to check machine .  Follow up Dr. OAnder Sladein 3  months and As needed   Please contact office for sooner follow up if symptoms do not improve or worsen or seek emergency care

## 2022-01-01 NOTE — Progress Notes (Signed)
[facility]  @Patient  ID: Patty Howard, female    DOB: 20-Jan-1956, 66 y.o.   MRN: [id number]  Chief Complaint  Patient presents with   Follow-up    Referring provider: de [place], Raymond J, MD  HPI: 66 year old female former smoker followed for COPD with asthma, obstructive sleep apnea Medical history significant for hypertension and GERD  TEST/EVENTS :  no significant bronchodilator response, DLCO 111%.  High-resolution CT chest March 2022 showed no evidence of interstitial lung disease.  Some mild postinfectious/inflammatory scarring in the lungs.   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  No hot tub or basement .  No unusual hobbies  Lives alone .  Drives independent.  No birds and chickens   01/01/2022 Follow up; COPD with asthma, obstructive sleep apnea Patient presents for 59-monthfollow-up.  Patient has underlying COPD with asthma.  She remains on Trelegy inhaler daily.  She is on Accolate twice daily  She uses DuoNeb or albuterol inhaler at home.  She is followed by the allergist and is on Fasenra every 2 months. Feeling much better since last visit. Decreased cough and wheezing .  She denies any hemoptysis, chest pain, orthopnea, edema. She is trying to become more active.  Patient has underlying sleep apnea.  Patient says she tried to restart her CPAP last visit.  Was wearing it for a few weeks but machine started to act up and has not been working properly.  CPAP download shows 73% usage between May 12 and June 10 with average usage at 3 hours.  AHI was at 6.4.  Patient says she is taking her machine into the DME company this week to have it checked.  No Known Allergies  Immunization History  Administered Date(s) Administered   Fluad Quad(high Dose 65+) 03/12/2021   Influenza, High Dose Seasonal PF 03/06/2019, 03/13/2019   Influenza,inj,Quad PF,6+ Mos 04/07/2018   Influenza-Unspecified 03/29/2017, 03/05/2020   Moderna SARS-COV2 Booster Vaccination 05/03/2020    Moderna Sars-Covid-2 Vaccination 08/19/2019, 09/14/2019   Pneumococcal Polysaccharide-23 03/29/2017   Tdap 10/30/2017   Zoster Recombinat (Shingrix) 03/30/2019    Past Medical History:  Diagnosis Date   Anxiety    Arthritis    Asthma    COPD (chronic obstructive pulmonary disease) (HCC)    Fracture    closed displaced left radial head   GERD (gastroesophageal reflux disease)    Hypertension    Sleep apnea    does not wear CPAP    Tobacco History: Social History   Tobacco Use  Smoking Status Former   Packs/day: 0.50   Years: 10.00   Total pack years: 5.00   Types: Cigarettes   Quit date: 06/15/1998   Years since quitting: 23.5  Smokeless Tobacco Never   Counseling given: Not Answered   Outpatient Medications Prior to Visit  Medication Sig Dispense Refill   albuterol (PROVENTIL) (2.5 MG/3ML) 0.083% nebulizer solution Take 3 mLs (2.5 mg total) by nebulization every 6 (six) hours as needed for wheezing or shortness of breath. 75 mL 1   albuterol (VENTOLIN HFA) 108 (90 Base) MCG/ACT inhaler Inhale 1-2 puffs into the lungs every 6 (six) hours as needed for wheezing or shortness of breath. 8 g 4   amLODipine (NORVASC) 5 MG tablet Take 1 tablet (5 mg total) by mouth daily. 90 tablet 3   ascorbic acid (VITAMIN C) 500 MG tablet Take 500 mg by mouth daily.     atorvastatin (LIPITOR) 20 MG tablet Take 1 tablet (20 mg total) by mouth  daily. 90 tablet 3   Benralizumab (FASENRA) 30 MG/ML SOSY Inject 1 mL (30 mg total) into the skin every 8 (eight) weeks. 1 mL 6   Diethylpropion HCl CR 75 MG TB24 1 tablet in the mid morning     Fluticasone-Umeclidin-Vilant (TRELEGY ELLIPTA) 200-62.5-25 MCG/ACT AEPB Inhale 1 puff daily 28 each 5   ipratropium-albuterol (DUONEB) 0.5-2.5 (3) MG/3ML SOLN Take 3 mLs by nebulization every 6 (six) hours as needed. (Patient taking differently: Take 3 mLs by nebulization every 6 (six) hours as needed (Wheezing, shortness of breath).) 150 mL 2   levocetirizine  (XYZAL) 5 MG tablet Take 1 tablet (5 mg total) by mouth every evening. 90 tablet 1   levothyroxine (SYNTHROID) 100 MCG tablet Take 100 mcg by mouth daily before breakfast.     losartan-hydrochlorothiazide (HYZAAR) 100-25 MG tablet Take 1 tablet by mouth daily. 90 tablet 3   Multiple Vitamin (MULTIVITAMIN WITH MINERALS) TABS tablet Take 1 tablet by mouth daily. Centrum Multivitamin     pantoprazole (PROTONIX) 40 MG tablet Take 1 tablet (40 mg total) by mouth daily. 90 tablet 1   Semaglutide, 1 MG/DOSE, 4 MG/3ML SOPN Inject 1 mg as directed once a week. 3 mL 1   Vitamin D, Ergocalciferol, (DRISDOL) 1.25 MG (50000 UNIT) CAPS capsule Take 50,000 Units by mouth every Monday.      potassium chloride SA (KLOR-CON M) 20 MEQ tablet Take 1 tablet (20 mEq total) by mouth daily. 30 tablet 0   zafirlukast (ACCOLATE) 20 MG tablet Take 1 tablet (20 mg total) by mouth 2 (two) times daily before a meal. 60 tablet 3   baclofen (LIORESAL) 10 MG tablet Take 10 mg by mouth 3 (three) times daily. (Patient not taking: Reported on 01/01/2022)     benzonatate (TESSALON) 200 MG capsule Take 1 capsule (200 mg total) by mouth 3 (three) times daily as needed for cough. (Patient not taking: Reported on 01/01/2022) 45 capsule 0   diazepam (VALIUM) 5 MG tablet Take 0.5 tablets (2.5 mg total) by mouth every 6 (six) hours as needed for muscle spasms (spasms). (Patient not taking: Reported on 01/01/2022) 10 tablet 0   Lido-Capsaicin-Men-Methyl Sal (MEDI-PATCH-LIDOCAINE) 0.5-0.035-5-20 % PTCH Apply 1 patch topically daily. (Patient not taking: Reported on 01/01/2022) 30 patch 0   montelukast (SINGULAIR) 10 MG tablet Take 1 tablet (10 mg total) by mouth at bedtime. (Patient not taking: Reported on 01/01/2022) 90 tablet 1   Facility-Administered Medications Prior to Visit  Medication Dose Route Frequency Provider Last Rate Last Admin   Benralizumab SOSY 30 mg  30 mg Subcutaneous Q8 Toney Reil, MD   30 mg at 11/27/21 1529      Review of Systems:   Constitutional:   No  weight loss, night sweats,  Fevers, chills,  +fatigue, or  lassitude.  HEENT:   No headaches,  Difficulty swallowing,  Tooth/dental problems, or  Sore throat,                No sneezing, itching, ear ache, nasal congestion, post nasal drip,   CV:  No chest pain,  Orthopnea, PND, swelling in lower extremities, anasarca, dizziness, palpitations, syncope.   GI  No heartburn, indigestion, abdominal pain, nausea, vomiting, diarrhea, change in bowel habits, loss of appetite, bloody stools.   Resp:   No chest wall deformity  Skin: no rash or lesions.  GU: no dysuria, change in color of urine, no urgency or frequency.  No flank pain, no hematuria   MS:  No joint pain or swelling.  No decreased range of motion.  No back pain.    Physical Exam  BP 110/70 (BP Location: Left Arm, Patient Position: Sitting, Cuff Size: Large)   Pulse (!) 101   Temp 98.6 F (37 C)   Ht 5' 10"  (1.778 m)   Wt 279 lb 3.2 oz (126.6 kg)   SpO2 95%   BMI 40.06 kg/m   GEN: A/Ox3; pleasant , NAD, well nourished    HEENT:  [place]/AT,   NOSE-clear, THROAT-clear, no lesions, no postnasal drip or exudate noted.   NECK:  Supple w/ fair ROM; no JVD; normal carotid impulses w/o bruits; no thyromegaly or nodules palpated; no lymphadenopathy.    RESP  Clear  P & A; w/o, wheezes/ rales/ or rhonchi. no accessory muscle use, no dullness to percussion  CARD:  RRR, no m/r/g, no peripheral edema, pulses intact, no cyanosis or clubbing.  GI:   Soft & nt; nml bowel sounds; no organomegaly or masses detected.   Musco: Warm bil, no deformities or joint swelling noted.   Neuro: alert, no focal deficits noted.    Skin: Warm, no lesions or rashes    Lab Results:       ProBNP No results found for: "PROBNP"  Imaging: No results found.  Benralizumab SOSY 30 mg     Date Action Dose Route User   11/27/2021 1529 Given 30 mg Subcutaneous (Right Arm) Carin Hock,  CMA   11/27/2021 1023 Given 30 mg Subcutaneous (Left Arm) Clovis Cao A, CMA          Latest Ref Rng & Units 06/30/2021   10:56 AM  PFT Results  FVC-Pre L 1.95   FVC-Predicted Pre % 66   FVC-Post L 2.01   FVC-Predicted Post % 68   Pre FEV1/FVC % % 69   Post FEV1/FCV % % 71   FEV1-Pre L 1.34   FEV1-Predicted Pre % 58   FEV1-Post L 1.42   DLCO uncorrected ml/min/mmHg 24.99   DLCO UNC% % 111   DLCO corrected ml/min/mmHg 24.99   DLCO COR %Predicted % 111   DLVA Predicted % 142   TLC L 5.27   TLC % Predicted % 93   RV % Predicted % 117     No results found for: "NITRICOXIDE"      Assessment & Plan:   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) (HCC) Improved control on current regimen. Continue to control for triggers  Plan  Patient Instructions  Continue on TRELEGY 1 puff daily ,rinse after use Continue on Accolate .  Albuterol inhaler and neb As needed   Chlorpheniramine 27m At bedtime  for drainage As needed   Delsym 2 tsp Twice daily  for cough As needed   Tessalon Three times a day  for cough as needed  Restart CPAP At bedtime, get DME to check machine .  Follow up Dr. OAnder Sladein 3  months and As needed   Please contact office for sooner follow up if symptoms do not improve or worsen or seek emergency care       OSA (obstructive sleep apnea) Encouraged on CPAP uses.  Patient is to take her machine into her homecare company for evaluation   Plan  Patient Instructions  Continue on TRELEGY 1 puff daily ,rinse after use Continue on Accolate .  Albuterol inhaler and neb As needed   Chlorpheniramine 467mAt bedtime  for drainage As needed   Delsym 2 tsp Twice  daily  for cough As needed   Tessalon Three times a day  for cough as needed  Restart CPAP At bedtime, get DME to check machine .  Follow up Dr. Ander Slade in 3  months and As needed   Please contact office for sooner follow up if symptoms do not improve or worsen or seek emergency care           Rexene Edison, NP 01/01/2022

## 2022-01-01 NOTE — Assessment & Plan Note (Signed)
Encouraged on CPAP uses.  Patient is to take her machine into her homecare company for evaluation   Plan  Patient Instructions  Continue on TRELEGY 1 puff daily ,rinse after use Continue on Accolate .  Albuterol inhaler and neb As needed   Chlorpheniramine 29m At bedtime  for drainage As needed   Delsym 2 tsp Twice daily  for cough As needed   Tessalon Three times a day  for cough as needed  Restart CPAP At bedtime, get DME to check machine .  Follow up Dr. OAnder Sladein 3  months and As needed   Please contact office for sooner follow up if symptoms do not improve or worsen or seek emergency care

## 2022-01-01 NOTE — Patient Instructions (Addendum)
Continue on TRELEGY 1 puff daily ,rinse after use Continue on Accolate .  Albuterol inhaler and neb As needed   Chlorpheniramine 39m At bedtime  for drainage As needed   Delsym 2 tsp Twice daily  for cough As needed   Tessalon Three times a day  for cough as needed  Restart CPAP At bedtime, get DME to check machine.  Follow up Dr. OAnder Sladein 3  months and As needed   Please contact office for sooner follow up if symptoms do not improve or worsen or seek emergency care

## 2022-01-05 ENCOUNTER — Encounter: Payer: Self-pay | Admitting: Physical Medicine and Rehabilitation

## 2022-01-05 ENCOUNTER — Ambulatory Visit (INDEPENDENT_AMBULATORY_CARE_PROVIDER_SITE_OTHER): Payer: Medicare Other | Admitting: Physical Medicine and Rehabilitation

## 2022-01-05 DIAGNOSIS — M5416 Radiculopathy, lumbar region: Secondary | ICD-10-CM | POA: Diagnosis not present

## 2022-01-05 DIAGNOSIS — M47816 Spondylosis without myelopathy or radiculopathy, lumbar region: Secondary | ICD-10-CM

## 2022-01-05 DIAGNOSIS — M48062 Spinal stenosis, lumbar region with neurogenic claudication: Secondary | ICD-10-CM | POA: Diagnosis not present

## 2022-01-05 DIAGNOSIS — M4726 Other spondylosis with radiculopathy, lumbar region: Secondary | ICD-10-CM

## 2022-01-05 MED ORDER — DIAZEPAM 5 MG PO TABS: ORAL_TABLET | ORAL | 0 refills | Status: DC

## 2022-01-05 NOTE — Progress Notes (Unsigned)
Pt state lower back pain that travels down her right leg. Pt state walking makes the pain worse. Pt state she takes pain meds and uses heat to help ease her pain.  Numeric Pain Rating Scale and Functional Assessment Average Pain 10 Pain Right Now 9 My pain is constant, stabbing, tingling, and aching Pain is worse with: walking and some activites Pain improves with: heat/ice and medication   In the last MONTH (on 0-10 scale) has pain interfered with the following?  1. General activity like being  able to carry out your everyday physical activities such as walking, climbing stairs, carrying groceries, or moving a chair?  Rating(5)  2. Relation with others like being able to carry out your usual social activities and roles such as  activities at home, at work and in your community. Rating(6)  3. Enjoyment of life such that you have  been bothered by emotional problems such as feeling anxious, depressed or irritable?  Rating(7)

## 2022-01-05 NOTE — Progress Notes (Unsigned)
Patty Howard - 66 y.o. female MRN [id number]  Date of birth: Jun 13, 1956  Office Visit Note: Visit Date: 01/05/2022 PCP: Tennis Must [place], Blondell Reveal, MD Referred by: de [place], Raymond J, MD  Subjective: Chief Complaint  Patient presents with   Lower Back - Pain   Right Leg - Pain   HPI: Patty Howard is a 66 y.o. female who comes in today per the request of Benjiman Core, [place] for evaluation of chronic, worsening and severe right sided lower back pain radiating to buttock, hip and down right leg. Pain ongoing for several years and is exacerbated by prolonged standing and walking. She describes her pain as sore, sharp and tingling, currently rates as 9 out of 10. Patient reports some relief of pain with home exercise regimen, rest and use of over the counter medications. Patient reports good relief of pain with topical Voltaren gel. Patient reports history of formal physical therapy while living in [place] several years ago, states no relief of pain with PT. She also reports history of spinal cord stimulator placement, was removed after 1 year due to minimal relief of pain. Patients lumbar MRI imaging from 2022 exhibits marked facet arthropathy and spinal canal stenosis at the level of L4-L5, there is also moderate spinal canal stenosis at L3-L4. Patient has no history of lumbar injections/lumbar surgery. Patient states her pain is negatively impacting her daily life, states she has to take breaks frequently when walking and performing activities. Patient denies focal weakness. Patient denies recent trauma or falls.      Oswestry Disability Index Score 46% 20 to 30 (60%) severe disability: Pain remains the main problem in this group but activities of daily living are affected. These patients require a detailed investigation.  Review of Systems  Musculoskeletal:  Positive for back pain.  Neurological:  Positive for tingling and sensory change. Negative for focal weakness and weakness.  All  other systems reviewed and are negative.  Otherwise per HPI.  Assessment & Plan: Visit Diagnoses:    ICD-10-CM   1. Lumbar radiculopathy  M54.16 Ambulatory referral to Physical Medicine Rehab    2. Other spondylosis with radiculopathy, lumbar region  M47.26     3. Spinal stenosis of lumbar region with neurogenic claudication  M48.062 Ambulatory referral to Physical Medicine Rehab    4. Facet hypertrophy of lumbar region  M47.816 Ambulatory referral to Physical Medicine Rehab       Plan: Findings:  Chronic, worsening and severe right sided lower back pain radiating to buttock, hip and down right leg. Patient continues to have severe pain despite good conservative therapies such as home exerciser regimen, rest and use of over the counter medications. Patients clinical presentation and exam are consistent with neurogenic claudication as a result of spinal canal stenosis. Next step is to perform diagnostic and hopefully therapeutic right L5-S1 interlaminar epidural steroid injection under fluoroscopic guidance. Not currently taking anticoagulant medications. Patient did voice concerns about anxiety related to procedure, I did place prescription for pre-procedure Valium. Patient is schedule to follow back up with Dr. Lorin Mercy post injection. No red flag symptoms noted upon exam today.     Meds & Orders:  Meds ordered this encounter  Medications   diazepam (VALIUM) 5 MG tablet    Sig: Take one tablet by mouth with food one hour prior to procedure. May repeat 30 minutes prior if needed.    Dispense:  2 tablet    Refill:  0    Orders Placed This Encounter  Procedures   Ambulatory referral to Physical Medicine Rehab    Follow-up: Return for Right L4-L5 interlaminar epidural steroid injection.   Procedures: No procedures performed      Clinical History: EXAM: MRI LUMBAR SPINE WITHOUT AND WITH CONTRAST   TECHNIQUE: Multiplanar and multiecho pulse sequences of the lumbar spine  were obtained without and with intravenous contrast.   CONTRAST:  21m MULTIHANCE GADOBENATE DIMEGLUMINE 529 MG/ML IV SOLN   COMPARISON:  None.   FINDINGS: Segmentation:  Standard.   Alignment:  Trace anterolisthesis at L4-L5 and L5-S1.   Vertebrae: Vertebral body heights are maintained. There is no substantial marrow edema. No suspicious osseous lesion.   Conus medullaris and cauda equina: Conus extends to the T12-L1 level. Conus and cauda equina appear normal.   Paraspinal and other soft tissues: Unremarkable.   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   L1-L2: Prominent epidural fat mildly flattening the thecal sac. No foraminal stenosis.   L2-L3: Small left foraminal protrusion. Mild facet arthropathy. Prominent dorsal epidural fat mildly flattening the thecal sac. No right foraminal stenosis. Minor left foraminal stenosis.   L3-L4: Disc bulge. Marked right and mild left facet arthropathy with ligamentum flavum infolding. Moderate canal stenosis. Narrowing of the subarticular recesses. Mild foraminal stenosis.   L4-L5: Disc bulge. Marked facet arthropathy with prominent hypertrophic changes and ligamentum flavum infolding. Marked canal stenosis. Narrowing of the subarticular recesses. Mild right foraminal stenosis. No left foraminal stenosis.   L5-S1: Disc bulge. Marked facet arthropathy with prominent hypertrophic changes and ligamentum flavum infolding. Minor canal stenosis. Mild foraminal stenosis.   IMPRESSION: Multilevel degenerative changes as detailed above. Canal stenosis is greatest L4-L5. There is marked lower lumbar facet arthropathy.     Electronically Signed   By: PMacy MisM.D.   On: 10/14/2020 10:29   She reports that she quit smoking about 23 years ago. Her smoking use included cigarettes. She has a 5.00 pack-year smoking history. She has never used smokeless tobacco.  Recent Labs    10/21/21 1536  HGBA1C 6.0*     Objective:  VS:  HT:    WT:   BMI:     BP:   HR: bpm  TEMP: ( )  RESP:  Physical Exam Vitals and nursing note reviewed.  HENT:     Head: Normocephalic and atraumatic.     Right Ear: External ear normal.     Left Ear: External ear normal.     Nose: Nose normal.     Mouth/Throat:     Mouth: Mucous membranes are moist.  Eyes:     Extraocular Movements: Extraocular movements intact.  Cardiovascular:     Rate and Rhythm: Normal rate.     Pulses: Normal pulses.  Pulmonary:     Effort: Pulmonary effort is normal.  Abdominal:     General: Abdomen is flat. There is no distension.  Musculoskeletal:        General: Tenderness present.     Cervical back: Normal range of motion.     Comments: Pt rises from seated position to standing without difficulty. Good lumbar range of motion. Strong distal strength without clonus, no pain upon palpation of greater trochanters. No pain with internal/external rotation of bilateral hips. Sensation intact bilaterally. Walks independently, gait steady.   Skin:    General: Skin is warm and dry.     Capillary Refill: Capillary refill takes less than 2 seconds.  Neurological:     General: No focal deficit present.  Mental Status: She is alert and oriented to person, place, and time.  Psychiatric:        Mood and Affect: Mood normal.        Behavior: Behavior normal.     Ortho Exam  Imaging: No results found.  Past Medical/Family/Surgical/Social History: Medications & Allergies reviewed per EMR, new medications updated. 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   Diastolic dysfunction 50/53/9767   Asthma exacerbation 11/13/2020   Menopause 11/08/2020   Influenza A 10/10/2020   Medication management 04/30/2020   Gastroesophageal reflux disease 03/14/2020   OSA (obstructive sleep apnea) 02/15/2020   Abnormal findings on diagnostic imaging of lung 01/08/2020   Educated about COVID-19 virus infection 11/30/2019   Aortic atherosclerosis ([place]) 11/30/2019   Chest pain 11/26/2019   Acute asthma exacerbation 05/22/2019   Left radial head fracture 02/03/2019   Closed fracture of radial head 01/29/2019   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) ([place]) 10/19/2018   Insomnia 10/19/2018   Elbow pain, chronic, left 10/19/2018   Dyspnea 10/12/2017   Pancolitis ([place]) 05/20/2017   H/O Spinal surgery 01/18/2015   Essential hypertension 01/18/2015   Obesity 01/18/2015   Past Medical History:  Diagnosis Date   Anxiety    Arthritis    Asthma    COPD (chronic obstructive pulmonary disease) ([place])    Fracture    closed displaced left radial head   GERD (gastroesophageal reflux disease)    Hypertension    Sleep apnea    does not wear CPAP   Family History  Problem Relation Age of Onset   Hypertension Mother    Stroke Mother    Cancer Father    Past Surgical History:  Procedure Laterality Date   BACK SURGERY     spinal stimulator   BREAST BIOPSY Right 2017   benign   COLONOSCOPY W/ BIOPSIES AND POLYPECTOMY     DILATION AND CURETTAGE OF UTERUS     LAPAROSCOPIC ROUX-EN-Y GASTRIC BYPASS WITH UPPER ENDOSCOPY AND REMOVAL OF LAP BAND     RADIAL HEAD ARTHROPLASTY Left 02/03/2019   Procedure: LEFT RADIAL HEAD ARTHROPLASTY;  Surgeon: [name], MD;  Location: [facility];  Service: Orthopedics;  Laterality: Left;  AXILLARY BLOCK VS BIER BLOCK   TOTAL HIP ARTHROPLASTY Left  01/22/2021   Procedure: LEFT TOTAL HIP ARTHROPLASTY ANTERIOR APPROACH;  Surgeon: [name], MD;  Location: [facility];  Service: Orthopedics;  Laterality: Left;   TUBAL LIGATION     Social History   Occupational History   Not on file  Tobacco Use   Smoking status: Former    Packs/day: 0.50    Years: 10.00    Total pack years: 5.00    Types: Cigarettes    Quit date: 06/15/1998    Years since quitting: 23.5   Smokeless tobacco: Never  Vaping Use   Vaping Use: Never used  Substance and Sexual Activity   Alcohol use: Never   Drug use: Never   Sexual  activity: Yes

## 2022-01-07 DIAGNOSIS — E559 Vitamin D deficiency, unspecified: Secondary | ICD-10-CM | POA: Insufficient documentation

## 2022-01-13 ENCOUNTER — Ambulatory Visit: Payer: BC Managed Care – PPO | Admitting: Orthopaedic Surgery

## 2022-01-15 ENCOUNTER — Ambulatory Visit (INDEPENDENT_AMBULATORY_CARE_PROVIDER_SITE_OTHER): Payer: Medicare Other

## 2022-01-15 DIAGNOSIS — J4550 Severe persistent asthma, uncomplicated: Secondary | ICD-10-CM

## 2022-01-15 MED ORDER — DUPILUMAB 300 MG/2ML ~~LOC~~ SOSY
300.0000 mg | PREFILLED_SYRINGE | SUBCUTANEOUS | Status: DC
Start: 2022-01-15 — End: 2022-07-06
  Administered 2022-01-15 – 2022-06-11 (×8): 300 mg via SUBCUTANEOUS

## 2022-01-15 NOTE — Progress Notes (Signed)
Immunotherapy   Patient Details  Name: Patty Howard MRN: [id number] Date of Birth: [birth year]  01/15/2022  Patty Howard started injections for  Dupixent Frequency: After loading dose of 636m patient is to receive 3061mevery two weeks.  Epi-Pen: Not needed.  Consent signed and patient instructions given. Patient waited in the lobby of the injection room for thirty minutes without an issue.    LuJulius Bowels/08/2021, 11:52 AM

## 2022-01-20 NOTE — Progress Notes (Deleted)
Cardiology Office Note   Date:  01/20/2022   ID:  Marlin Brys, DOB 03-21-1956, MRN [id number]  PCP:  de [place], Raymond J, MD  Cardiologist:   Minus Breeding, MD Referring:  de [place], Raymond J, MD  No chief complaint on file.     History of Present Illness: Patty Howard is a 66 y.o. female who presents for evaluation of shortness of breath and chest pain. She was just hospitalized on 6/13-6/15. She has a history of asthma and COPD. 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  She was in the ED with chest pain.  She was treated for diastolic HF:  She had aortic atherosclerosis on CT.   She had chest pain and she had a negative perfusion study.  I treated her with Lasix for possible acute diastolic HF.  At the last visit I restarted Norvasc.    ***  ***  She has continued to have some dyspnea with exertion.  She thinks she did feel better after she took Lasix and she had less lower extremity swelling.  However, turns out she has been off of all of her blood pressure medications.  She saw her pulmonologist recently and I reviewed this.  No change in therapy was indicated and it was suggested that she needed weight loss and exercise.  She does have a CT plan in 6 months to follow-up pulmonary nodule.   Past Medical History:  Diagnosis Date   Anxiety    Arthritis    Asthma    COPD (chronic obstructive pulmonary disease) ([place])    Fracture    closed displaced left radial head   GERD (gastroesophageal reflux disease)    Hypertension    Sleep apnea    does not wear CPAP    Past Surgical History:  Procedure Laterality Date   BACK SURGERY     spinal stimulator   BREAST BIOPSY Right 2017   benign   COLONOSCOPY W/ BIOPSIES AND POLYPECTOMY     DILATION AND CURETTAGE OF UTERUS     LAPAROSCOPIC ROUX-EN-Y GASTRIC BYPASS WITH UPPER ENDOSCOPY AND REMOVAL OF LAP BAND     RADIAL HEAD  ARTHROPLASTY Left 02/03/2019   Procedure: LEFT RADIAL HEAD ARTHROPLASTY;  Surgeon: [name], MD;  Location: [facility];  Service: Orthopedics;  Laterality: Left;  AXILLARY BLOCK VS BIER BLOCK   TOTAL HIP ARTHROPLASTY Left 01/22/2021   Procedure: LEFT TOTAL HIP ARTHROPLASTY ANTERIOR APPROACH;  Surgeon: [name], MD;  Location: [facility];  Service: Orthopedics;  Laterality: Left;   TUBAL LIGATION       Current Outpatient Medications  Medication Sig Dispense Refill   albuterol (PROVENTIL) (2.5 MG/3ML) 0.083% nebulizer solution Take 3 mLs (2.5 mg total) by nebulization every 6 (six) hours as needed for wheezing or shortness of breath. 75 mL 1   albuterol (VENTOLIN HFA) 108 (90 Base) MCG/ACT inhaler Inhale 1-2 puffs into the lungs every 6 (six) hours as needed for wheezing or shortness of breath. 8 g 4   amLODipine (NORVASC) 5 MG tablet Take 1 tablet (5 mg total) by mouth daily. 90 tablet 3   ascorbic acid (VITAMIN C) 500 MG tablet Take 500 mg by mouth daily.     atorvastatin (LIPITOR) 20 MG tablet Take 1 tablet (20 mg total) by mouth daily. 90 tablet 3   Benralizumab (FASENRA) 30 MG/ML SOSY Inject 1 mL (  30 mg total) into the skin every 8 (eight) weeks. 1 mL 6   benzonatate (TESSALON) 200 MG capsule Take 1 capsule (200 mg total) by mouth 3 (three) times daily as needed for cough. 30 capsule 2   diazepam (VALIUM) 5 MG tablet Take one tablet by mouth with food one hour prior to procedure. May repeat 30 minutes prior if needed. 2 tablet 0   Diethylpropion HCl CR 75 MG TB24 1 tablet in the mid morning     Fluticasone-Umeclidin-Vilant (TRELEGY ELLIPTA) 200-62.5-25 MCG/ACT AEPB Inhale 1 puff daily 28 each 5   ipratropium-albuterol (DUONEB) 0.5-2.5 (3) MG/3ML SOLN Take 3 mLs by nebulization every 6 (six) hours as needed. (Patient taking differently: Take 3 mLs by nebulization every 6 (six) hours as needed (Wheezing, shortness of breath).) 150 mL 2   levocetirizine (XYZAL) 5 MG tablet Take 1 tablet (5 mg  total) by mouth every evening. 90 tablet 1   levothyroxine (SYNTHROID) 100 MCG tablet Take 100 mcg by mouth daily before breakfast.     losartan-hydrochlorothiazide (HYZAAR) 100-25 MG tablet Take 1 tablet by mouth daily. 90 tablet 3   Multiple Vitamin (MULTIVITAMIN WITH MINERALS) TABS tablet Take 1 tablet by mouth daily. Centrum Multivitamin     pantoprazole (PROTONIX) 40 MG tablet Take 1 tablet (40 mg total) by mouth daily. 90 tablet 1   potassium chloride SA (KLOR-CON M) 20 MEQ tablet Take 1 tablet (20 mEq total) by mouth daily. 30 tablet 0   Semaglutide, 1 MG/DOSE, 4 MG/3ML SOPN Inject 1 mg as directed once a week. 3 mL 1   Vitamin D, Ergocalciferol, (DRISDOL) 1.25 MG (50000 UNIT) CAPS capsule Take 50,000 Units by mouth every Monday.      zafirlukast (ACCOLATE) 20 MG tablet Take 1 tablet (20 mg total) by mouth 2 (two) times daily before a meal. 60 tablet 3   Current Facility-Administered Medications  Medication Dose Route Frequency Provider Last Rate Last Admin   Benralizumab SOSY 30 mg  30 mg Subcutaneous Q8 Toney Reil, MD   30 mg at 11/27/21 1529   dupilumab (DUPIXENT) prefilled syringe 300 mg  300 mg Subcutaneous Q14 Days Valentina Shaggy, MD   300 mg at 01/15/22 1106    Allergies:   Patient has no known allergies.   ROS:  Please see the history of present illness.   Otherwise, review of systems are positive for ***.   All other systems are reviewed and negative.    PHYSICAL EXAM: VS:  There were no vitals taken for this visit. , BMI There is no height or weight on file to calculate BMI. GENERAL:  Well appearing NECK:  No jugular venous distention, waveform within normal limits, carotid upstroke brisk and symmetric, no bruits, no thyromegaly LUNGS:  Clear to auscultation bilaterally CHEST:  Unremarkable HEART:  PMI not displaced or sustained,S1 and S2 within normal limits, no S3, no S4, no clicks, no rubs, *** murmurs ABD:  Flat, positive bowel sounds normal in  frequency in pitch, no bruits, no rebound, no guarding, no midline pulsatile mass, no hepatomegaly, no splenomegaly EXT:  2 plus pulses throughout, no edema, no cyanosis no clubbing     ***GENERAL:  Well appearing NECK:  No jugular venous distention, waveform within normal limits, carotid upstroke brisk and symmetric, no bruits, no thyromegaly LUNGS:  Clear to auscultation bilaterally CHEST:  Unremarkable HEART:  PMI not displaced or sustained,S1 and S2 within normal limits, no S3, no S4, no clicks, no rubs, no  murmurs ABD:  Flat, positive bowel sounds normal in frequency in pitch, no bruits, no rebound, no guarding, no midline pulsatile mass, no hepatomegaly, no splenomegaly EXT:  2 plus pulses throughout, mild edema, no cyanosis no clubbing   EKG:  EKG is *** ordered today. ***   Recent Labs: 09/27/2021: Magnesium 1.7 09/28/2021: Hemoglobin 10.2; Platelets 317 10/21/2021: ALT 24; BUN 13; Creatinine, Ser 0.79; Potassium 4.2; Sodium 143    Lipid Panel    Component Value Date/Time   CHOL 197 10/21/2021 1536   TRIG 56 10/21/2021 1536   HDL 77 10/21/2021 1536   CHOLHDL 2.6 10/21/2021 1536   LDLCALC 110 (H) 10/21/2021 1536   LDLDIRECT 107 (H) 10/21/2021 1536      Wt Readings from Last 3 Encounters:  01/01/22 279 lb 3.2 oz (126.6 kg)  12/26/21 279 lb (126.6 kg)  12/05/21 279 lb (126.6 kg)      Other studies Reviewed: Additional studies/ records that were reviewed today include: *** Review of the above records demonstrates:  Please see elsewhere in the note.     ASSESSMENT AND PLAN:  CHEST PAIN:   The patient's had no new chest pain.  *** She had a negative perfusion study.  No change in therapy.   SOB: ***  There probably is some component of diastolic dysfunction.  Mostly however, this is obesity and physical inactivity.  It is also nonadherence with her medications.  We had a long discussion about this.  I restarted her blood pressure medicines.  She can take her Lasix  as needed.  I'm going to have her see our health educator  HTN:   ***  I'm going to restart the amlodipine and other meds as above.   OBESITY:   ***  She has morbid obesity.  We discussed the need for diet and activity and again she'll see our health educator.     Current medicines are reviewed at length with the patient today.  The patient does not have concerns regarding medicines.  The following changes have been made:  ***  Labs/ tests ordered today include:   ***  No orders of the defined types were placed in this encounter.    Disposition:   FU with me in *** months   Signed, Minus Breeding, MD  01/20/2022 8:21 PM    [facility]

## 2022-01-22 ENCOUNTER — Ambulatory Visit: Payer: BC Managed Care – PPO | Admitting: Cardiology

## 2022-01-22 DIAGNOSIS — R0602 Shortness of breath: Secondary | ICD-10-CM

## 2022-01-22 DIAGNOSIS — I10 Essential (primary) hypertension: Secondary | ICD-10-CM

## 2022-01-22 DIAGNOSIS — R072 Precordial pain: Secondary | ICD-10-CM

## 2022-01-23 ENCOUNTER — Ambulatory Visit: Payer: Medicare Other

## 2022-01-26 ENCOUNTER — Encounter: Payer: Self-pay | Admitting: Physical Medicine and Rehabilitation

## 2022-01-28 ENCOUNTER — Telehealth: Payer: Self-pay | Admitting: *Deleted

## 2022-01-28 ENCOUNTER — Ambulatory Visit: Payer: Medicare Other

## 2022-01-28 ENCOUNTER — Ambulatory Visit (INDEPENDENT_AMBULATORY_CARE_PROVIDER_SITE_OTHER): Payer: No Typology Code available for payment source

## 2022-01-28 DIAGNOSIS — J4550 Severe persistent asthma, uncomplicated: Secondary | ICD-10-CM

## 2022-01-28 NOTE — Telephone Encounter (Signed)
L/m for patient that with new ins sending her Dupixent Rx to [place] and she will need to give ok to ship same for her next injection

## 2022-01-28 NOTE — Telephone Encounter (Signed)
Patient called back and I informed her of Tammy's message.

## 2022-01-29 ENCOUNTER — Ambulatory Visit: Payer: No Typology Code available for payment source

## 2022-02-03 ENCOUNTER — Ambulatory Visit: Payer: Medicare Other | Admitting: Orthopaedic Surgery

## 2022-02-04 ENCOUNTER — Ambulatory Visit (HOSPITAL_BASED_OUTPATIENT_CLINIC_OR_DEPARTMENT_OTHER): Payer: Medicare Other | Admitting: Family Medicine

## 2022-02-10 ENCOUNTER — Telehealth (HOSPITAL_BASED_OUTPATIENT_CLINIC_OR_DEPARTMENT_OTHER): Payer: Self-pay | Admitting: Family Medicine

## 2022-02-10 NOTE — Telephone Encounter (Signed)
Pt needs Dupixent and  Allbuterol inhalor called in

## 2022-02-11 ENCOUNTER — Ambulatory Visit (INDEPENDENT_AMBULATORY_CARE_PROVIDER_SITE_OTHER): Payer: Self-pay | Admitting: Physical Medicine and Rehabilitation

## 2022-02-11 ENCOUNTER — Ambulatory Visit: Payer: No Typology Code available for payment source

## 2022-02-11 ENCOUNTER — Encounter: Payer: Self-pay | Admitting: Physical Medicine and Rehabilitation

## 2022-02-11 ENCOUNTER — Ambulatory Visit: Payer: Self-pay

## 2022-02-11 VITALS — BP 127/87 | HR 106

## 2022-02-11 DIAGNOSIS — M5416 Radiculopathy, lumbar region: Secondary | ICD-10-CM

## 2022-02-11 MED ORDER — METHYLPREDNISOLONE ACETATE 80 MG/ML IJ SUSP
40.0000 mg | Freq: Once | INTRAMUSCULAR | Status: AC
  Administered 2022-02-11: 40 mg

## 2022-02-11 NOTE — Patient Instructions (Signed)

## 2022-02-11 NOTE — Progress Notes (Unsigned)
    Pt state lower back pain that travels down her right leg. Pt state walking makes the pain worse. Pt state she takes pain meds and uses heat to help ease her pain.  Numeric Pain Rating Scale and Functional Assessment Average Pain 8   In the last MONTH (on 0-10 scale) has pain interfered with the following?  1. General activity like being  able to carry out your everyday physical activities such as walking, climbing stairs, carrying groceries, or moving a chair?  Rating(10)   +Driver, -BT, -Dye Allergies.

## 2022-02-12 NOTE — Procedures (Signed)
Lumbar Epidural Steroid Injection - Interlaminar Approach with Fluoroscopic Guidance  Patient: Patty Howard      Date of Birth: 08/13/1955 MRN: [id number] PCP: de [place], Raymond J, MD      Visit Date: 02/11/2022   Universal Protocol:     Consent Given By: the patient  Position: PRONE  Additional Comments: Vital signs were monitored before and after the procedure. Patient was prepped and draped in the usual sterile fashion. The correct patient, procedure, and site was verified.   Injection Procedure Details:   Procedure diagnoses: Lumbar radiculopathy [M54.16]   Meds Administered:  Meds ordered this encounter  Medications   methylPREDNISolone acetate (DEPO-MEDROL) injection 40 mg     Laterality: Right  Location/Site:  L4-5  Needle: 4.5 in., 20 ga. Tuohy  Needle Placement: Paramedian epidural  Findings:   -Comments: Excellent flow of contrast into the epidural space.  Procedure Details: Using a paramedian approach from the side mentioned above, the region overlying the inferior lamina was localized under fluoroscopic visualization and the soft tissues overlying this structure were infiltrated with 4 ml. of 1% Lidocaine without Epinephrine. The Tuohy needle was inserted into the epidural space using a paramedian approach.   The epidural space was localized using loss of resistance along with counter oblique bi-planar fluoroscopic views.  After negative aspirate for air, blood, and CSF, a 2 ml. volume of Isovue-250 was injected into the epidural space and the flow of contrast was observed. Radiographs were obtained for documentation purposes.    The injectate was administered into the level noted above.   Additional Comments:  The patient tolerated the procedure well Dressing: 2 x 2 sterile gauze and Band-Aid    Post-procedure details: Patient was observed during the procedure. Post-procedure instructions were reviewed.  Patient left the clinic in stable  condition.

## 2022-02-12 NOTE — Progress Notes (Signed)
Patty Howard - 66 y.o. female MRN [id number]  Date of birth: Aug 18, 1955  Office Visit Note: Visit Date: 02/11/2022 PCP: de [place], Raymond J, MD Referred by: de [place], Raymond J, MD  Subjective: Chief Complaint  Patient presents with   Lower Back - Pain   Right Leg - Pain   HPI:  Patty Howard is a 66 y.o. female who comes in today at the request of Barnet Pall, FNP for planned Left L4-5 Lumbar Interlaminar epidural steroid injection with fluoroscopic guidance.  The patient has failed conservative care including home exercise, medications, time and activity modification.  This injection will be diagnostic and hopefully therapeutic.  Please see requesting physician notes for further details and justification.   ROS Otherwise per HPI.  Assessment & Plan: Visit Diagnoses:    ICD-10-CM   1. Lumbar radiculopathy  M54.16 XR C-ARM NO REPORT    Epidural Steroid injection    methylPREDNISolone acetate (DEPO-MEDROL) injection 40 mg      Plan: No additional findings.   Meds & Orders:  Meds ordered this encounter  Medications   methylPREDNISolone acetate (DEPO-MEDROL) injection 40 mg    Orders Placed This Encounter  Procedures   XR C-ARM NO REPORT   Epidural Steroid injection    Follow-up: Return for visit to requesting provider as needed.   Procedures: No procedures performed  Lumbar Epidural Steroid Injection - Interlaminar Approach with Fluoroscopic Guidance  Patient: Patty Howard      Date of Birth: 11-19-1955 MRN: [id number] PCP: de [place], Raymond J, MD      Visit Date: 02/11/2022   Universal Protocol:     Consent Given By: the patient  Position: PRONE  Additional Comments: Vital signs were monitored before and after the procedure. Patient was prepped and draped in the usual sterile fashion. The correct patient, procedure, and site was verified.   Injection Procedure Details:   Procedure diagnoses: Lumbar radiculopathy [M54.16]   Meds  Administered:  Meds ordered this encounter  Medications   methylPREDNISolone acetate (DEPO-MEDROL) injection 40 mg     Laterality: Right  Location/Site:  L4-5  Needle: 4.5 in., 20 ga. Tuohy  Needle Placement: Paramedian epidural  Findings:   -Comments: Excellent flow of contrast into the epidural space.  Procedure Details: Using a paramedian approach from the side mentioned above, the region overlying the inferior lamina was localized under fluoroscopic visualization and the soft tissues overlying this structure were infiltrated with 4 ml. of 1% Lidocaine without Epinephrine. The Tuohy needle was inserted into the epidural space using a paramedian approach.   The epidural space was localized using loss of resistance along with counter oblique bi-planar fluoroscopic views.  After negative aspirate for air, blood, and CSF, a 2 ml. volume of Isovue-250 was injected into the epidural space and the flow of contrast was observed. Radiographs were obtained for documentation purposes.    The injectate was administered into the level noted above.   Additional Comments:  The patient tolerated the procedure well Dressing: 2 x 2 sterile gauze and Band-Aid    Post-procedure details: Patient was observed during the procedure. Post-procedure instructions were reviewed.  Patient left the clinic in stable condition.   Clinical History: EXAM: MRI LUMBAR SPINE WITHOUT AND WITH CONTRAST   TECHNIQUE: Multiplanar and multiecho pulse sequences of the lumbar spine were obtained without and with intravenous contrast.   CONTRAST:  54m MULTIHANCE GADOBENATE DIMEGLUMINE 529 MG/ML IV SOLN   COMPARISON:  None.   FINDINGS: Segmentation:  Standard.  Alignment:  Trace anterolisthesis at L4-L5 and L5-S1.   Vertebrae: Vertebral body heights are maintained. There is no substantial marrow edema. No suspicious osseous lesion.   Conus medullaris and cauda equina: Conus extends to the  T12-L1 level. Conus and cauda equina appear normal.   Paraspinal and other soft tissues: Unremarkable.   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   L1-L2: Prominent epidural fat mildly flattening the thecal sac. No foraminal stenosis.   L2-L3: Small left foraminal protrusion. Mild facet arthropathy. Prominent dorsal epidural fat mildly flattening the thecal sac. No right foraminal stenosis. Minor left foraminal stenosis.   L3-L4: Disc bulge. Marked right and mild left facet arthropathy with ligamentum flavum infolding. Moderate canal stenosis. Narrowing of the subarticular recesses. Mild foraminal stenosis.   L4-L5: Disc bulge. Marked facet arthropathy with prominent hypertrophic changes and ligamentum flavum infolding. Marked canal stenosis. Narrowing of the subarticular recesses. Mild right foraminal stenosis. No left foraminal stenosis.   L5-S1: Disc bulge. Marked facet arthropathy with prominent hypertrophic changes and ligamentum flavum infolding. Minor canal stenosis. Mild foraminal stenosis.   IMPRESSION: Multilevel degenerative changes as detailed above. Canal stenosis is greatest L4-L5. There is marked lower lumbar facet arthropathy.     Electronically Signed   By: Macy Mis M.D.   On: 10/14/2020 10:29     Objective:  VS:  HT:    WT:   BMI:     BP:127/87  HR:(!) 106bpm  TEMP: ( )  RESP:  Physical Exam Vitals and nursing note reviewed.  Constitutional:      General: She is not in acute distress.    Appearance: Normal appearance. She is obese. She is not ill-appearing.  HENT:     Head: Normocephalic and atraumatic.     Right Ear: External ear normal.     Left Ear: External ear normal.  Eyes:     Extraocular Movements: Extraocular movements intact.  Cardiovascular:     Rate and Rhythm: Normal rate.     Pulses: Normal pulses.  Pulmonary:     Effort: Pulmonary effort is normal. No respiratory distress.  Abdominal:     General: There  is no distension.     Palpations: Abdomen is soft.  Musculoskeletal:        General: Tenderness present.     Cervical back: Neck supple.     Right lower leg: No edema.     Left lower leg: No edema.     Comments: Patient has good distal strength with no pain over the greater trochanters.  No clonus or focal weakness.  Skin:    Findings: No erythema, lesion or rash.  Neurological:     General: No focal deficit present.     Mental Status: She is alert and oriented to person, place, and time.     Sensory: No sensory deficit.     Motor: No weakness or abnormal muscle tone.     Coordination: Coordination normal.  Psychiatric:        Mood and Affect: Mood normal.        Behavior: Behavior normal.      Imaging: No results found.

## 2022-02-17 ENCOUNTER — Ambulatory Visit (INDEPENDENT_AMBULATORY_CARE_PROVIDER_SITE_OTHER): Payer: No Typology Code available for payment source | Admitting: Family Medicine

## 2022-02-17 ENCOUNTER — Encounter (HOSPITAL_BASED_OUTPATIENT_CLINIC_OR_DEPARTMENT_OTHER): Payer: Self-pay | Admitting: Family Medicine

## 2022-02-17 DIAGNOSIS — M25551 Pain in right hip: Secondary | ICD-10-CM | POA: Insufficient documentation

## 2022-02-17 DIAGNOSIS — E119 Type 2 diabetes mellitus without complications: Secondary | ICD-10-CM

## 2022-02-17 HISTORY — DX: Pain in right hip: M25.551

## 2022-02-17 MED ORDER — IBUPROFEN 800 MG PO TABS: 800.0000 mg | ORAL_TABLET | Freq: Three times a day (TID) | ORAL | 0 refills | Status: DC | PRN

## 2022-02-17 MED ORDER — SEMAGLUTIDE (1 MG/DOSE) 4 MG/3ML ~~LOC~~ SOPN: 1.0000 mg | PEN_INJECTOR | SUBCUTANEOUS | 2 refills | Status: DC

## 2022-02-17 NOTE — Assessment & Plan Note (Signed)
She has been having some worsening right hip pain.  This has been a chronic intermittent issue in the past but has been more noticeable recently.  Most recent prior evaluation was earlier this year, at that time she had imaging completed and evaluation with Dr. Lorin Mercy.  Recommendation at that time was to proceed with conservative measures, physical therapy and following up in about 6 months.  She is currently having some increased right hip pain.  Has been utilizing NSAID to assist with pain relief.  Today she is wondering about medication to help with pain, need for updated imaging, possible referral to physical therapy. Reviewed prior imaging, there is some evidence of joint space narrowing, there is some limitation related to soft tissue obscuring joint evaluation. At this time, feel it would be reasonable to proceed with referral to physical therapy, this has been placed.  I have also provided a short course of NSAID for patient to utilize to assist with pain control, discussed potential risks and side effects related to NSAID use. Also recommend that she contact Dr. Narda Amber office to schedule follow-up visit as it has been a little bit over 6 months now.  Can further discuss treatment options with orthopedist at that time

## 2022-02-17 NOTE — Patient Instructions (Signed)
  Medication Instructions:  Your physician recommends that you continue on your current medications as directed. Please refer to the Current Medication list given to you today. --If you need a refill on any your medications before your next appointment, please call your pharmacy first. If no refills are authorized on file call the office.-- Lab Work: Your physician has recommended that you have lab work today: Yes If you have labs (blood work) drawn today and your tests are completely normal, you will receive your results via [place] a phone call from our staff.  Please ensure you check your voicemail in the event that you authorized detailed messages to be left on a delegated number. If you have any lab test that is abnormal or we need to change your treatment, we will call you to review the results.  Referrals/Procedures/Imaging: No  Follow-Up: Your next appointment:   Your physician recommends that you schedule a follow-up appointment in: 2-3 months with Dr. de [place].  You will receive a text message or e-mail with a link to a survey about your care and experience with [place] today! We would greatly appreciate your feedback!   Thanks for letting us be apart of your health journey!!  Primary Care and Sports Medicine   Dr. Arlina Robes [place]   We encourage you to activate your patient portal called "MyChart".  Sign up information is provided on this After Visit Summary.  MyChart is used to connect with patients for Virtual Visits (Telemedicine).  Patients are able to view lab/test results, encounter notes, upcoming appointments, etc.  Non-urgent messages can be sent to your provider as well. To learn more about what you can do with MyChart, please visit --  [email or web address].

## 2022-02-17 NOTE — Progress Notes (Signed)
    Procedures performed today:    None.  Independent interpretation of notes and tests performed by another provider:   None.  Brief History, Exam, Impression, and Recommendations:    BP 122/60   Pulse 99   Ht 5' 10"  (1.778 m)   Wt 282 lb 4.8 oz (128.1 kg)   SpO2 100%   BMI 40.51 kg/m   Type 2 diabetes mellitus without complication, without long-term current use of insulin ([place]) Patient continues with Ozempic, has been utilizing 1 mg weekly dose.  Reports that she has been tolerating medication, denies any issues with nausea, vomiting, GI upset.  She has missed 1 dose recently as she ran out of the medication but generally has been administering regularly. She did complete microalbumin/creatinine testing at last office visit, however it appears that did not come through from [place] as of yet We will check hemoglobin A1c today, was at goal previously  Right hip pain She has been having some worsening right hip pain.  This has been a chronic intermittent issue in the past but has been more noticeable recently.  Most recent prior evaluation was earlier this year, at that time she had imaging completed and evaluation with Dr. Lorin Mercy.  Recommendation at that time was to proceed with conservative measures, physical therapy and following up in about 6 months.  She is currently having some increased right hip pain.  Has been utilizing NSAID to assist with pain relief.  Today she is wondering about medication to help with pain, need for updated imaging, possible referral to physical therapy. Reviewed prior imaging, there is some evidence of joint space narrowing, there is some limitation related to soft tissue obscuring joint evaluation. At this time, feel it would be reasonable to proceed with referral to physical therapy, this has been placed.  I have also provided a short course of NSAID for patient to utilize to assist with pain control, discussed potential risks and side effects related to  NSAID use. Also recommend that she contact Dr. Narda Amber office to schedule follow-up visit as it has been a little bit over 6 months now.  Can further discuss treatment options with orthopedist at that time  Return in about 3 months (around 05/19/2022) for DM.   ___________________________________________ Staysha Truby de [place], MD, ABFM, CAQSM Primary Care and [place]

## 2022-02-17 NOTE — Assessment & Plan Note (Signed)
Patient continues with Ozempic, has been utilizing 1 mg weekly dose.  Reports that she has been tolerating medication, denies any issues with nausea, vomiting, GI upset.  She has missed 1 dose recently as she ran out of the medication but generally has been administering regularly. She did complete microalbumin/creatinine testing at last office visit, however it appears that did not come through from [place] as of yet We will check hemoglobin A1c today, was at goal previously

## 2022-02-20 ENCOUNTER — Other Ambulatory Visit: Payer: Self-pay | Admitting: Allergy & Immunology

## 2022-02-24 ENCOUNTER — Encounter: Payer: Self-pay | Admitting: Orthopaedic Surgery

## 2022-02-24 ENCOUNTER — Ambulatory Visit (INDEPENDENT_AMBULATORY_CARE_PROVIDER_SITE_OTHER): Payer: No Typology Code available for payment source | Admitting: Orthopaedic Surgery

## 2022-02-24 VITALS — BP 123/72 | Ht 70.0 in | Wt 280.0 lb

## 2022-02-24 DIAGNOSIS — M25551 Pain in right hip: Secondary | ICD-10-CM | POA: Diagnosis not present

## 2022-02-24 DIAGNOSIS — M7062 Trochanteric bursitis, left hip: Secondary | ICD-10-CM | POA: Diagnosis not present

## 2022-02-24 NOTE — Progress Notes (Signed)
Office Visit Note   Patient: Patty Howard           Date of Birth: 1955/11/16           MRN: [id number] Visit Date: 02/24/2022              Requested by: de [place], Raymond J, MD [address] [place],  [place] [id number] PCP: de [place], Raymond J, MD   Assessment & Plan: Visit Diagnoses:  1. Pain in right hip   2. Trochanteric bursitis, left hip   3. Right hip pain     Plan: Left total hip doing well.  Prior to her total hip she had an MRI of the left hip which showed gluteus minimus and moderate gluteus medius atrophy tendinopathy with fatty atrophy.  Dedicated imaging of the right hip was not performed but the right gluteus minimus appeared either completely torn or moderately severely torn with fat atrophy.  We will proceed with a designated right hip MRI for evaluation due to ongoing pain failure to respond to trochanteric injection anti-inflammatories use of a cane, lumbar epidural injection.  Office follow-up after MRI scan right hip.  Follow-Up Instructions: No follow-ups on file.   Orders:  Orders Placed This Encounter  Procedures   MR Hip Right w/o contrast   No orders of the defined types were placed in this encounter.     Procedures: No procedures performed   Clinical Data: No additional findings.   Subjective: Chief Complaint  Patient presents with   Lower Back - Pain, Follow-up   Right Hip - Follow-up, Pain    HPI 66 year old female returns with ongoing problems with right hip pain.  Interlaminar approach epidural by Dr. Ernestina Patches 02/11/2022 patient states not really helped she still having pain laterally over the hip points to the trochanter.  She is taken ibuprofen 800 without relief.  No associated bowel or bladder symptoms.  She has noted weakness with abduction.  She has been able to lose 30 pounds.  Review of Systems system updated unchanged from 10/01/2021 office visit.  Previous left total hip arthroplasty.  Type 2 diabetes not on insulin,  asthma and COPD.  Otherwise noncontributory HPI.   Objective: Vital Signs: BP 123/72   Ht 5' 10"  (1.778 m)   Wt 280 lb (127 kg)   BMI 40.18 kg/m   Physical Exam Constitutional:      Appearance: She is well-developed.  HENT:     Head: Normocephalic.     Right Ear: External ear normal.     Left Ear: External ear normal. There is no impacted cerumen.  Eyes:     Pupils: Pupils are equal, round, and reactive to light.  Neck:     Thyroid: No thyromegaly.     Trachea: No tracheal deviation.  Cardiovascular:     Rate and Rhythm: Normal rate.  Pulmonary:     Effort: Pulmonary effort is normal.  Abdominal:     Palpations: Abdomen is soft.  Musculoskeletal:     Cervical back: No rigidity.  Skin:    General: Skin is warm and dry.  Neurological:     Mental Status: She is alert and oriented to person, place, and time.  Psychiatric:        Behavior: Behavior normal.     Ortho Exam patient has significant tenderness over the right gluteus medius tendon and trochanter.  Pain with resisted abduction.  Specialty Comments:  EXAM: MRI LUMBAR SPINE WITHOUT AND WITH CONTRAST   TECHNIQUE: Multiplanar  and multiecho pulse sequences of the lumbar spine were obtained without and with intravenous contrast.   CONTRAST:  75m MULTIHANCE GADOBENATE DIMEGLUMINE 529 MG/ML IV SOLN   COMPARISON:  None.   FINDINGS: Segmentation:  Standard.   Alignment:  Trace anterolisthesis at L4-L5 and L5-S1.   Vertebrae: Vertebral body heights are maintained. There is no substantial marrow edema. No suspicious osseous lesion.   Conus medullaris and cauda equina: Conus extends to the T12-L1 level. Conus and cauda equina appear normal.   Paraspinal and other soft tissues: Unremarkable.   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   L1-L2: Prominent epidural fat mildly flattening the thecal sac. No foraminal stenosis.   L2-L3: Small left foraminal protrusion. Mild facet  arthropathy. Prominent dorsal epidural fat mildly flattening the thecal sac. No right foraminal stenosis. Minor left foraminal stenosis.   L3-L4: Disc bulge. Marked right and mild left facet arthropathy with ligamentum flavum infolding. Moderate canal stenosis. Narrowing of the subarticular recesses. Mild foraminal stenosis.   L4-L5: Disc bulge. Marked facet arthropathy with prominent hypertrophic changes and ligamentum flavum infolding. Marked canal stenosis. Narrowing of the subarticular recesses. Mild right foraminal stenosis. No left foraminal stenosis.   L5-S1: Disc bulge. Marked facet arthropathy with prominent hypertrophic changes and ligamentum flavum infolding. Minor canal stenosis. Mild foraminal stenosis.   IMPRESSION: Multilevel degenerative changes as detailed above. Canal stenosis is greatest L4-L5. There is marked lower lumbar facet arthropathy.     Electronically Signed   By: PMacy MisM.D.   On: 10/14/2020 10:29  Imaging: No results found.   PMFS History: Patient Active Problem List   Diagnosis Date Noted   Right hip pain 02/17/2022   Cough 12/05/2021   Type 2 diabetes mellitus without complication, without long-term current use of insulin ([facility] 11/05/2021   Trigger finger, left middle finger 10/02/2021   Hypokalemia 09/28/2021   COPD with acute exacerbation ([facility] 09/28/2021   Acute exacerbation of COPD with asthma ([facility] 09/27/2021   Severe sepsis ([facility] 09/27/2021   Other intervertebral disc degeneration, lumbar region 07/15/2021   History of total hip arthroplasty, left 03/07/2021   Trochanteric bursitis, left hip 12/23/2020   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/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  Diastolic dysfunction 095/02/3266  Asthma exacerbation 11/13/2020   Menopause 11/08/2020   Influenza A 10/10/2020   Medication management 04/30/2020   Gastroesophageal reflux disease 03/14/2020   OSA (obstructive sleep apnea) 02/15/2020   Abnormal findings on diagnostic imaging of lung  01/08/2020   Educated about COVID-19 virus infection 11/30/2019   Aortic atherosclerosis ([facility] 11/30/2019   Chest pain 11/26/2019   Acute asthma exacerbation 05/22/2019   Left radial head fracture 02/03/2019   Closed fracture of radial head 01/29/2019   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) ([facility] 10/19/2018   Insomnia 10/19/2018   Elbow pain, chronic, left 10/19/2018   Dyspnea 10/12/2017   Pancolitis ([facility] 05/20/2017   H/O Spinal surgery 01/18/2015   Essential hypertension 01/18/2015   Obesity 01/18/2015   Past Medical History:  Diagnosis Date   Anxiety    Arthritis    Asthma    COPD (chronic obstructive pulmonary disease) ([facility]    Fracture    closed displaced left radial head   GERD (gastroesophageal reflux disease)    Hypertension    Sleep apnea    does not wear CPAP    Family History  Problem Relation Age of Onset   Hypertension Mother    Stroke Mother    Cancer Father     Past  Surgical History:  Procedure Laterality Date   BACK SURGERY     spinal stimulator   BREAST BIOPSY Right 2017   benign   COLONOSCOPY W/ BIOPSIES AND POLYPECTOMY     DILATION AND CURETTAGE OF UTERUS     LAPAROSCOPIC ROUX-EN-Y GASTRIC BYPASS WITH UPPER ENDOSCOPY AND REMOVAL OF LAP BAND     RADIAL HEAD ARTHROPLASTY Left 02/03/2019   Procedure: LEFT RADIAL HEAD ARTHROPLASTY;  Surgeon: [name], MD;  Location: [facility];  Service: Orthopedics;  Laterality: Left;  AXILLARY BLOCK VS BIER BLOCK   TOTAL HIP ARTHROPLASTY Left 01/22/2021   Procedure: LEFT TOTAL HIP ARTHROPLASTY ANTERIOR APPROACH;  Surgeon: [name], MD;  Location: [facility];  Service: Orthopedics;  Laterality: Left;   TUBAL LIGATION     Social History   Occupational History   Not on file  Tobacco Use   Smoking status: Former    Packs/day: 0.50    Years: 10.00    Total pack years: 5.00    Types: Cigarettes    Quit date: 06/15/1998    Years since quitting: 23.7   Smokeless tobacco: Never  Vaping Use    Vaping Use: Never used  Substance and Sexual Activity   Alcohol use: Never   Drug use: Never   Sexual activity: Yes

## 2022-02-27 ENCOUNTER — Telehealth: Payer: Self-pay | Admitting: Orthopaedic Surgery

## 2022-02-27 ENCOUNTER — Other Ambulatory Visit: Payer: Self-pay | Admitting: Orthopaedic Surgery

## 2022-02-27 MED ORDER — TRAMADOL HCL 50 MG PO TABS: 50.0000 mg | ORAL_TABLET | Freq: Two times a day (BID) | ORAL | 0 refills | Status: DC | PRN

## 2022-02-27 NOTE — Telephone Encounter (Signed)
Please advise 

## 2022-02-27 NOTE — Progress Notes (Unsigned)
tramado

## 2022-02-27 NOTE — Telephone Encounter (Signed)
I called patient and advised. 

## 2022-02-27 NOTE — Telephone Encounter (Signed)
Patient called needing Rx refilled Tramadol. The number to contact patient is [phone]

## 2022-03-04 ENCOUNTER — Encounter: Payer: Self-pay | Admitting: Pulmonary Disease

## 2022-03-04 ENCOUNTER — Ambulatory Visit (INDEPENDENT_AMBULATORY_CARE_PROVIDER_SITE_OTHER): Payer: No Typology Code available for payment source | Admitting: Pulmonary Disease

## 2022-03-04 ENCOUNTER — Ambulatory Visit (INDEPENDENT_AMBULATORY_CARE_PROVIDER_SITE_OTHER): Payer: No Typology Code available for payment source | Admitting: *Deleted

## 2022-03-04 VITALS — BP 118/78 | HR 68 | Temp 98.0°F | Ht 70.0 in | Wt 277.8 lb

## 2022-03-04 DIAGNOSIS — J4550 Severe persistent asthma, uncomplicated: Secondary | ICD-10-CM | POA: Diagnosis not present

## 2022-03-04 DIAGNOSIS — G4733 Obstructive sleep apnea (adult) (pediatric): Secondary | ICD-10-CM

## 2022-03-04 MED ORDER — PREDNISONE 20 MG PO TABS: 20.0000 mg | ORAL_TABLET | Freq: Every day | ORAL | 0 refills | Status: DC

## 2022-03-04 MED ORDER — DOXYCYCLINE HYCLATE 100 MG PO TABS: 100.0000 mg | ORAL_TABLET | Freq: Two times a day (BID) | ORAL | 0 refills | Status: DC

## 2022-03-04 NOTE — Progress Notes (Signed)
Subjective:    Patient ID: Patty Howard, female    DOB: Oct 15, 1955, 66 y.o.   MRN: [id number]  Patient being seen for obstructive sleep apnea She does have a history of obstructive lung disease  CPAP machine became dysfunctional recently and has not been using it  Struggling with upper respiratory tract infection at present -Cough with sputum production, yellow-green, no fevers or chills -Shortness of breath with activity -Increase inhaler use with symptoms  Obstructive sleep apnea diagnosed about 10 years ago  She has continued to stay very active and continues to lose weight  Has a history of asthma/COPD, history of hypertension  On Dupixent for asthma   Past Medical History:  Diagnosis Date   Anxiety    Arthritis    Asthma    COPD (chronic obstructive pulmonary disease) ([place])    Fracture    closed displaced left radial head   GERD (gastroesophageal reflux disease)    Hypertension    Sleep apnea    does not wear CPAP   Social History   Socioeconomic History   Marital status: Widowed    Spouse name: Not on file   Number of children: Not on file   Years of education: Not on file   Highest education level: Not on file  Occupational History   Not on file  Tobacco Use   Smoking status: Former    Packs/day: 0.50    Years: 10.00    Total pack years: 5.00    Types: Cigarettes    Quit date: 06/15/1998    Years since quitting: 23.7   Smokeless tobacco: Never  Vaping Use   Vaping Use: Never used  Substance and Sexual Activity   Alcohol use: Never   Drug use: Never   Sexual activity: Yes  Other Topics Concern   Not on file  Social History Narrative   Three children.  Engineer, structural in [place].     Social Determinants of Health   Financial Resource Strain: Not on file  Food Insecurity: Not on file  Transportation Needs: Not on file  Physical Activity: Not on file  Stress: Not on file  Social Connections: Not on file  Intimate Partner Violence: Not  on file   Family History  Problem Relation Age of Onset   Hypertension Mother    Stroke Mother    Cancer Father     Review of Systems  Constitutional:  Negative for fever and unexpected weight change.  HENT:  Positive for congestion. Negative for dental problem, ear pain, nosebleeds, postnasal drip, rhinorrhea, sinus pressure, sneezing, sore throat and trouble swallowing.   Eyes:  Negative for redness and itching.  Respiratory:  Positive for cough, shortness of breath and wheezing. Negative for chest tightness.   Cardiovascular:  Negative for palpitations and leg swelling.  Gastrointestinal:  Negative for nausea and vomiting.  Genitourinary:  Negative for dysuria.  Musculoskeletal:  Negative for joint swelling.  Skin:  Negative for rash.  Allergic/Immunologic: Positive for environmental allergies and food allergies. Negative for immunocompromised state.  Neurological:  Negative for headaches.  Hematological:  Does not bruise/bleed easily.  Psychiatric/Behavioral:  Negative for dysphoric mood. The patient is nervous/anxious.        Objective:   Physical Exam Constitutional:      Appearance: She is obese.  HENT:     Head: Normocephalic and atraumatic.  Eyes:     Pupils: Pupils are equal, round, and reactive to light.  Cardiovascular:     Rate and  Rhythm: Normal rate.     Pulses: Normal pulses.     Heart sounds: Normal heart sounds. No murmur heard.    No friction rub.  Pulmonary:     Effort: Pulmonary effort is normal. No respiratory distress.     Breath sounds: No stridor. Rhonchi present. No wheezing.  Musculoskeletal:     Cervical back: No rigidity or tenderness.  Neurological:     General: No focal deficit present.     Mental Status: She is alert.  Psychiatric:        Mood and Affect: Mood normal.    Vitals:   03/04/22 1043  BP: 118/78  Pulse: 68  Temp: 98 F (36.7 C)  SpO2: 96%      04/05/2019    2:00 PM  Results of the Epworth flowsheet  Sitting and  reading 3  Watching TV 3  Sitting, inactive in a public place (e.g. a theatre or a meeting) 3  As a passenger in a car for an hour without a break 3  Lying down to rest in the afternoon when circumstances permit 3  Sitting and talking to someone 3  Sitting quietly after a lunch without alcohol 3  In a car, while stopped for a few minutes in traffic 3  Total score 24   Previous study not available to me at present  -Most recent download that runs through June shows 73% compliance Average use of 3 hours 12 minutes Set between 5 and 12 with 95 percentile pressure of 10.9 Residual AHI of 6.4    .  CT scan 11/26/2019 reviewed showing a 5 mm not lung nodule-scans reviewed by myself  -11/11/2020 shows small nodule at the base-benign   Assessment & Plan:  Severe obstructive sleep apnea -With break in therapy -We will need to repeat home sleep study -Machine is dysfunctional  Obstructive pulmonary disease/asthma -Prescription for doxycycline -Prescription for prednisone  Class II obesity -Encouraged to continue working on weight loss efforts   Plan: .  Schedule home sleep study .  Continue Dupixent .  Nebulization treatments as needed .  Prescription for doxycycline and prednisone .  Follow-up in 6 months  .  Encouraged to call with significant concerns.Marland Kitchen

## 2022-03-04 NOTE — Patient Instructions (Signed)
Schedule for home sleep study to ascertain severity of sleep disordered breathing  Prescription for doxycycline and prednisone sent into pharmacy for you  Continue weight loss efforts  Call us with significant concerns  Follow-up in 6 months

## 2022-03-16 ENCOUNTER — Other Ambulatory Visit: Payer: BC Managed Care – PPO

## 2022-03-17 ENCOUNTER — Ambulatory Visit: Payer: BC Managed Care – PPO | Admitting: Orthopaedic Surgery

## 2022-03-18 ENCOUNTER — Ambulatory Visit (INDEPENDENT_AMBULATORY_CARE_PROVIDER_SITE_OTHER): Payer: No Typology Code available for payment source

## 2022-03-18 DIAGNOSIS — J4550 Severe persistent asthma, uncomplicated: Secondary | ICD-10-CM | POA: Diagnosis not present

## 2022-04-01 ENCOUNTER — Ambulatory Visit: Payer: No Typology Code available for payment source

## 2022-04-08 ENCOUNTER — Telehealth: Payer: Self-pay | Admitting: Orthopaedic Surgery

## 2022-04-08 NOTE — Telephone Encounter (Signed)
Patient called advised she has new insurance coverage now and can be set up to get her MRI again for her hip. The number to contact patient is [phone]

## 2022-04-10 NOTE — Telephone Encounter (Signed)
I spoke with patient. She will bring card in to office this morning so that we can get it uploaded in system to work on insurance authorization for MRI.

## 2022-04-14 ENCOUNTER — Ambulatory Visit: Payer: Self-pay

## 2022-04-15 ENCOUNTER — Other Ambulatory Visit: Payer: Self-pay

## 2022-04-15 ENCOUNTER — Encounter (HOSPITAL_BASED_OUTPATIENT_CLINIC_OR_DEPARTMENT_OTHER): Payer: Self-pay

## 2022-04-15 ENCOUNTER — Emergency Department (HOSPITAL_BASED_OUTPATIENT_CLINIC_OR_DEPARTMENT_OTHER): Payer: Medicare Other

## 2022-04-15 ENCOUNTER — Emergency Department (HOSPITAL_BASED_OUTPATIENT_CLINIC_OR_DEPARTMENT_OTHER)
Admission: EM | Admit: 2022-04-15 | Discharge: 2022-04-16 | Disposition: A | Discharge status: Home / Self Care | Payer: Medicare Other | Source: Home / Self Care | Attending: Emergency Medicine | Admitting: Emergency Medicine

## 2022-04-15 DIAGNOSIS — J181 Lobar pneumonia, unspecified organism: Secondary | ICD-10-CM | POA: Insufficient documentation

## 2022-04-15 DIAGNOSIS — Z7952 Long term (current) use of systemic steroids: Secondary | ICD-10-CM | POA: Insufficient documentation

## 2022-04-15 DIAGNOSIS — J45901 Unspecified asthma with (acute) exacerbation: Secondary | ICD-10-CM | POA: Insufficient documentation

## 2022-04-15 DIAGNOSIS — Z87891 Personal history of nicotine dependence: Secondary | ICD-10-CM | POA: Insufficient documentation

## 2022-04-15 DIAGNOSIS — J441 Chronic obstructive pulmonary disease with (acute) exacerbation: Secondary | ICD-10-CM | POA: Insufficient documentation

## 2022-04-15 DIAGNOSIS — Z79899 Other long term (current) drug therapy: Secondary | ICD-10-CM | POA: Insufficient documentation

## 2022-04-15 DIAGNOSIS — Z20822 Contact with and (suspected) exposure to covid-19: Secondary | ICD-10-CM | POA: Insufficient documentation

## 2022-04-15 DIAGNOSIS — J4541 Moderate persistent asthma with (acute) exacerbation: Secondary | ICD-10-CM | POA: Diagnosis not present

## 2022-04-15 DIAGNOSIS — I10 Essential (primary) hypertension: Secondary | ICD-10-CM | POA: Insufficient documentation

## 2022-04-15 DIAGNOSIS — J189 Pneumonia, unspecified organism: Secondary | ICD-10-CM

## 2022-04-15 LAB — CBC WITH DIFFERENTIAL/PLATELET
Abs Immature Granulocytes: 0.02 10*3/uL (ref 0.00–0.07)
Basophils Absolute: 0 10*3/uL (ref 0.0–0.1)
Basophils Relative: 0 %
Eosinophils Absolute: 0 10*3/uL (ref 0.0–0.5)
Eosinophils Relative: 0 %
HCT: 35.8 % — ABNORMAL LOW (ref 36.0–46.0)
Hemoglobin: 11.4 g/dL — ABNORMAL LOW (ref 12.0–15.0)
Immature Granulocytes: 0 %
Lymphocytes Relative: 30 %
Lymphs Abs: 1.8 10*3/uL (ref 0.7–4.0)
MCH: 26.1 pg (ref 26.0–34.0)
MCHC: 31.8 g/dL (ref 30.0–36.0)
MCV: 81.9 fL (ref 80.0–100.0)
Monocytes Absolute: 0.3 10*3/uL (ref 0.1–1.0)
Monocytes Relative: 6 %
Neutro Abs: 3.7 10*3/uL (ref 1.7–7.7)
Neutrophils Relative %: 64 %
Platelets: 298 10*3/uL (ref 150–400)
RBC: 4.37 MIL/uL (ref 3.87–5.11)
RDW: 15.2 % (ref 11.5–15.5)
WBC: 5.8 10*3/uL (ref 4.0–10.5)
nRBC: 0 % (ref 0.0–0.2)

## 2022-04-15 LAB — RESP PANEL BY RT-PCR (FLU A&B, COVID) ARPGX2
Influenza A by PCR: NEGATIVE
Influenza B by PCR: NEGATIVE
SARS Coronavirus 2 by RT PCR: NEGATIVE

## 2022-04-15 LAB — I-STAT VENOUS BLOOD GAS, ED
Acid-Base Excess: 5 mmol/L — ABNORMAL HIGH (ref 0.0–2.0)
Bicarbonate: 30.4 mmol/L — ABNORMAL HIGH (ref 20.0–28.0)
Calcium, Ion: 1.23 mmol/L (ref 1.15–1.40)
HCT: 36 % (ref 36.0–46.0)
Hemoglobin: 12.2 g/dL (ref 12.0–15.0)
O2 Saturation: 65 %
Patient temperature: 98.1
Potassium: 3.6 mmol/L (ref 3.5–5.1)
Sodium: 140 mmol/L (ref 135–145)
TCO2: 32 mmol/L (ref 22–32)
pCO2, Ven: 45.1 mmHg (ref 44–60)
pH, Ven: 7.436 — ABNORMAL HIGH (ref 7.25–7.43)
pO2, Ven: 32 mmHg (ref 32–45)

## 2022-04-15 LAB — BASIC METABOLIC PANEL
Anion gap: 11 (ref 5–15)
BUN: 14 mg/dL (ref 8–23)
CO2: 27 mmol/L (ref 22–32)
Calcium: 9.7 mg/dL (ref 8.9–10.3)
Chloride: 103 mmol/L (ref 98–111)
Creatinine, Ser: 0.9 mg/dL (ref 0.44–1.00)
GFR, Estimated: >60 mL/min (ref >60)
Glucose, Bld: 94 mg/dL (ref 70–99)
Potassium: 3.5 mmol/L (ref 3.5–5.1)
Sodium: 141 mmol/L (ref 135–145)

## 2022-04-15 LAB — TROPONIN I (HIGH SENSITIVITY): Troponin I (High Sensitivity): 8 ng/L (ref <18)

## 2022-04-15 LAB — BRAIN NATRIURETIC PEPTIDE: B Natriuretic Peptide: 36.8 pg/mL (ref 0.0–100.0)

## 2022-04-15 MED ORDER — ALBUTEROL SULFATE (2.5 MG/3ML) 0.083% IN NEBU
2.5000 mg | INHALATION_SOLUTION | Freq: Once | RESPIRATORY_TRACT | Status: AC
  Administered 2022-04-15: 2.5 mg via RESPIRATORY_TRACT
  Filled 2022-04-15: qty 3

## 2022-04-15 MED ORDER — PREDNISONE 50 MG PO TABS: 50.0000 mg | ORAL_TABLET | Freq: Every day | ORAL | 0 refills | Status: DC

## 2022-04-15 MED ORDER — SODIUM CHLORIDE 0.9 % IV SOLN
500.0000 mg | Freq: Once | INTRAVENOUS | Status: AC
  Administered 2022-04-15: 500 mg via INTRAVENOUS
  Filled 2022-04-15: qty 5

## 2022-04-15 MED ORDER — IPRATROPIUM-ALBUTEROL 0.5-2.5 (3) MG/3ML IN SOLN
3.0000 mL | Freq: Once | RESPIRATORY_TRACT | Status: AC
  Administered 2022-04-15: 3 mL via RESPIRATORY_TRACT
  Filled 2022-04-15: qty 3

## 2022-04-15 MED ORDER — MAGNESIUM SULFATE 2 GM/50ML IV SOLN
2.0000 g | Freq: Once | INTRAVENOUS | Status: AC
  Administered 2022-04-15: 2 g via INTRAVENOUS
  Filled 2022-04-15: qty 50

## 2022-04-15 MED ORDER — METHYLPREDNISOLONE SODIUM SUCC 125 MG IJ SOLR
62.5000 mg | Freq: Once | INTRAMUSCULAR | Status: AC
  Administered 2022-04-15: 62.5 mg via INTRAVENOUS
  Filled 2022-04-15: qty 2

## 2022-04-15 MED ORDER — AZITHROMYCIN 250 MG PO TABS: 250.0000 mg | ORAL_TABLET | Freq: Every day | ORAL | 0 refills | Status: DC

## 2022-04-15 MED ORDER — GUAIFENESIN 100 MG/5ML PO LIQD: 5.0000 mL | ORAL | 0 refills | Status: DC | PRN

## 2022-04-15 MED ORDER — AMOXICILLIN-POT CLAVULANATE 875-125 MG PO TABS: 1.0000 | ORAL_TABLET | Freq: Two times a day (BID) | ORAL | 0 refills | Status: DC

## 2022-04-15 MED ORDER — SODIUM CHLORIDE 0.9 % IV SOLN
1.0000 g | Freq: Once | INTRAVENOUS | Status: AC
  Administered 2022-04-15: 1 g via INTRAVENOUS
  Filled 2022-04-15: qty 10

## 2022-04-15 NOTE — ED Notes (Signed)
XR tech bedside for CXR. RT notified of pt needing assessment.

## 2022-04-15 NOTE — ED Notes (Signed)
Pt able to speak in complete sentences with NAD, wheezing noted.

## 2022-04-15 NOTE — Discharge Instructions (Addendum)
Please start taking your antibiotics and steroids 11/2.   It was a pleasure caring for you today in the emergency department.  Please return to the emergency department for any worsening or worrisome symptoms.

## 2022-04-15 NOTE — ED Provider Notes (Signed)
[facility] Provider Note   CSN: [id number] Arrival date & time: 04/15/22  2118     History  Chief Complaint  Patient presents with   Shortness of Breath    Patty Howard is a 66 y.o. female.  Patient as above with significant medical history as below, including asthma, COPD, hypertension, OSA on CPAP nightly who presents to the ED with complaint of dib.  Ongoing difficulty breathing the past 4 days.  Productive cough with yellow/clear sputum.  Chills no fevers.  Mild chest tightness no chest pain.  No nausea or vomiting.  No significant improvement with home rescue inhaler or nebulizer.  She only uses her CPAP at nighttime, has not had to use her CPAP at daytime.  No recent travel or sick contacts, compliant with home medications. No ongoing tobacco use.     Past Medical History:  Diagnosis Date   Anxiety    Arthritis    Asthma    COPD (chronic obstructive pulmonary disease) ([place])    Fracture    closed displaced left radial head   GERD (gastroesophageal reflux disease)    Hypertension    Sleep apnea    does not wear CPAP    Past Surgical History:  Procedure Laterality Date   BACK SURGERY     spinal stimulator   BREAST BIOPSY Right 2017   benign   COLONOSCOPY W/ BIOPSIES AND POLYPECTOMY     DILATION AND CURETTAGE OF UTERUS     LAPAROSCOPIC ROUX-EN-Y GASTRIC BYPASS WITH UPPER ENDOSCOPY AND REMOVAL OF LAP BAND     RADIAL HEAD ARTHROPLASTY Left 02/03/2019   Procedure: LEFT RADIAL HEAD ARTHROPLASTY;  Surgeon: [name], MD;  Location: [facility];  Service: Orthopedics;  Laterality: Left;  AXILLARY BLOCK VS BIER BLOCK   TOTAL HIP ARTHROPLASTY Left 01/22/2021   Procedure: LEFT TOTAL HIP ARTHROPLASTY ANTERIOR APPROACH;  Surgeon: [name], MD;  Location: [facility];  Service: Orthopedics;  Laterality: Left;   TUBAL LIGATION       The history is provided by the patient. No language interpreter was used.  Shortness of Breath Associated  symptoms: cough   Associated symptoms: no abdominal pain, no chest pain, no fever, no headaches and no rash        Home Medications Prior to Admission medications   Medication Sig Start Date End Date Taking? Authorizing Provider  amoxicillin-clavulanate (AUGMENTIN) 875-125 MG tablet Take 1 tablet by mouth every 12 (twelve) hours. 04/16/22  Yes Wynona Dove A, DO  azithromycin (ZITHROMAX) 250 MG tablet Take 1 tablet (250 mg total) by mouth daily. Take first 2 tablets together, then 1 every day until finished. 04/16/22  Yes Wynona Dove A, DO  guaiFENesin (ROBITUSSIN) 100 MG/5ML liquid Take 5 mLs by mouth every 4 (four) hours as needed for cough or to loosen phlegm. 04/15/22  Yes Wynona Dove A, DO  predniSONE (DELTASONE) 50 MG tablet Take 1 tablet (50 mg total) by mouth daily for 5 days. 04/16/22 04/21/22 Yes Wynona Dove A, DO  albuterol (PROVENTIL) (2.5 MG/3ML) 0.083% nebulizer solution Take 3 mLs (2.5 mg total) by nebulization every 6 (six) hours as needed for wheezing or shortness of breath. 04/23/21   Clemon Chambers, MD  albuterol (VENTOLIN HFA) 108 (90 Base) MCG/ACT inhaler Inhale 1-2 puffs into the lungs every 6 (six) hours as needed for wheezing or shortness of breath. 11/27/21   Valentina Shaggy, MD  amLODipine (NORVASC) 5 MG tablet Take 1 tablet (5 mg total)  by mouth daily. 10/21/21   [facility], Tami Lin, PA  ascorbic acid (VITAMIN C) 500 MG tablet Take 500 mg by mouth daily.    [provider]  atorvastatin (LIPITOR) 20 MG tablet Take 1 tablet (20 mg total) by mouth daily. 10/23/21   [facility], Tami Lin, PA  benzonatate (TESSALON) 200 MG capsule Take 1 capsule (200 mg total) by mouth 3 (three) times daily as needed for cough. 01/01/22   Parrett, Fonnie Mu, NP  Diethylpropion HCl CR 75 MG TB24 1 tablet in the mid morning 10/16/21   [provider]  Fluticasone-Umeclidin-Vilant (TRELEGY ELLIPTA) 200-62.5-25 MCG/ACT AEPB Inhale 1 puff daily 12/05/21   [place], Blondell Reveal, MD   ibuprofen (ADVIL) 800 MG tablet Take 1 tablet (800 mg total) by mouth every 8 (eight) hours as needed for moderate pain. 02/17/22   [place], Blondell Reveal, MD  ipratropium-albuterol (DUONEB) 0.5-2.5 (3) MG/3ML SOLN Take 3 mLs by nebulization every 6 (six) hours as needed. Patient taking differently: Take 3 mLs by nebulization every 6 (six) hours as needed (Wheezing, shortness of breath). 04/23/21   Clemon Chambers, MD  levocetirizine (XYZAL) 5 MG tablet Take 1 tablet (5 mg total) by mouth every evening. 04/23/21   Clemon Chambers, MD  levothyroxine (SYNTHROID) 100 MCG tablet Take 100 mcg by mouth daily before breakfast.    [provider]  losartan-hydrochlorothiazide (HYZAAR) 100-25 MG tablet Take 1 tablet by mouth daily. 10/21/21   [facility], Tami Lin, PA  Multiple Vitamin (MULTIVITAMIN WITH MINERALS) TABS tablet Take 1 tablet by mouth daily. Centrum Multivitamin    [provider]  pantoprazole (PROTONIX) 40 MG tablet Take 1 tablet (40 mg total) by mouth daily. 11/27/21   Valentina Shaggy, MD  potassium chloride SA (KLOR-CON M) 20 MEQ tablet Take 1 tablet (20 mEq total) by mouth daily. 09/30/21 10/30/21  Barb Merino, MD  Semaglutide, 1 MG/DOSE, 4 MG/3ML SOPN Inject 1 mg as directed once a week. 02/17/22   [place], Blondell Reveal, MD  traMADol (ULTRAM) 50 MG tablet Take 1 tablet (50 mg total) by mouth every 12 (twelve) hours as needed. 02/27/22   [name], MD  Vitamin D, Ergocalciferol, (DRISDOL) 1.25 MG (50000 UNIT) CAPS capsule Take 50,000 Units by mouth every Monday.  10/27/19   [provider]  zafirlukast (ACCOLATE) 20 MG tablet TAKE 1 TABLET (20 MG TOTAL) BY MOUTH 2 (TWO) TIMES DAILY BEFORE A MEAL. 02/20/22 03/22/22  Valentina Shaggy, MD      Allergies    Patient has no known allergies.    Review of Systems   Review of Systems  Constitutional:  Positive for chills and fatigue. Negative for activity change and fever.  HENT:  Negative for facial swelling and trouble  swallowing.   Eyes:  Negative for discharge and redness.  Respiratory:  Positive for cough, chest tightness and shortness of breath.   Cardiovascular:  Negative for chest pain and palpitations.  Gastrointestinal:  Negative for abdominal pain and nausea.  Genitourinary:  Negative for dysuria and flank pain.  Musculoskeletal:  Negative for back pain and gait problem.  Skin:  Negative for pallor and rash.  Neurological:  Negative for syncope and headaches.    Physical Exam Updated Vital Signs BP (!) 142/105   Pulse (!) 101   Temp 99.3 F (37.4 C) (Oral)   Resp (!) 25   Ht 5' 10"  (1.778 m)   Wt 126 kg   SpO2 98%   BMI  39.86 kg/m  Physical Exam Vitals and nursing note reviewed.  Constitutional:      General: She is not in acute distress.    Appearance: Normal appearance. She is well-developed. She is obese. She is not diaphoretic.  HENT:     Head: Normocephalic and atraumatic.     Right Ear: External ear normal.     Left Ear: External ear normal.     Nose: Nose normal.     Mouth/Throat:     Mouth: Mucous membranes are moist.  Eyes:     General: No scleral icterus.       Right eye: No discharge.        Left eye: No discharge.  Cardiovascular:     Rate and Rhythm: Regular rhythm. Tachycardia present.     Pulses: Normal pulses.     Heart sounds: Normal heart sounds.  Pulmonary:     Effort: Tachypnea and accessory muscle usage present. No respiratory distress.     Breath sounds: Decreased breath sounds and wheezing present.  Abdominal:     General: Abdomen is flat.     Tenderness: There is no abdominal tenderness.  Musculoskeletal:        General: Normal range of motion.     Cervical back: Normal range of motion.     Right lower leg: No edema.     Left lower leg: No edema.  Skin:    General: Skin is warm and dry.     Capillary Refill: Capillary refill takes less than 2 seconds.  Neurological:     Mental Status: She is alert.  Psychiatric:        Mood and Affect:  Mood normal.        Behavior: Behavior normal.     ED Results / Procedures / Treatments   Labs (all labs ordered are listed, but only abnormal results are displayed) Labs Reviewed  CBC WITH DIFFERENTIAL/PLATELET - Abnormal; Notable for the following components:      Result Value   Hemoglobin 11.4 (*)    HCT 35.8 (*)    All other components within normal limits  I-STAT VENOUS BLOOD GAS, ED - Abnormal; Notable for the following components:   pH, Ven 7.436 (*)    Bicarbonate 30.4 (*)    Acid-Base Excess 5.0 (*)    All other components within normal limits  RESP PANEL BY RT-PCR (FLU A&B, COVID) ARPGX2  BASIC METABOLIC PANEL  BRAIN NATRIURETIC PEPTIDE  BLOOD GAS, VENOUS  TROPONIN I (HIGH SENSITIVITY)  TROPONIN I (HIGH SENSITIVITY)    EKG EKG Interpretation  Date/Time:  Wednesday April 15 2022 21:41:08 EDT Ventricular Rate:  100 PR Interval:  143 QRS Duration: 96 QT Interval:  373 QTC Calculation: 482 R Axis:   62 Text Interpretation: Sinus tachycardia Atrial premature complexes Probable left atrial enlargement similar to prior, no stemi Confirmed by Wynona Dove ([id number]) on 04/15/2022 11:30:42 PM  Radiology DG Chest Portable 1 View  Result Date: 04/15/2022 CLINICAL DATA:  Shortness of breath EXAM: PORTABLE CHEST 1 VIEW COMPARISON:  Chest x-ray 10/27/2021 FINDINGS: There is minimal atelectasis/airspace disease in the right middle lobe. No pleural effusion or pneumothorax. Cardiomediastinal silhouette is within normal limits. No acute fractures. IMPRESSION: Minimal atelectasis/airspace disease in the right middle lobe. Electronically Signed   By: Ronney Asters M.D.   On: 04/15/2022 21:53    Procedures Procedures    Medications Ordered in ED Medications  azithromycin (ZITHROMAX) 500 mg in sodium chloride 0.9 % 250 mL IVPB (500  mg Intravenous New Bag/Given 04/15/22 2317)  ipratropium-albuterol (DUONEB) 0.5-2.5 (3) MG/3ML nebulizer solution 3 mL (3 mLs Nebulization Given  04/15/22 2205)  albuterol (PROVENTIL) (2.5 MG/3ML) 0.083% nebulizer solution 2.5 mg (2.5 mg Nebulization Given 04/15/22 2205)  methylPREDNISolone sodium succinate (SOLU-MEDROL) 125 mg/2 mL injection 62.5 mg (62.5 mg Intravenous Given 04/15/22 2223)  magnesium sulfate IVPB 2 g 50 mL (2 g Intravenous New Bag/Given 04/15/22 2224)  cefTRIAXone (ROCEPHIN) 1 g in sodium chloride 0.9 % 100 mL IVPB (1 g Intravenous New Bag/Given 04/15/22 2243)  ipratropium-albuterol (DUONEB) 0.5-2.5 (3) MG/3ML nebulizer solution 3 mL (3 mLs Nebulization Given 04/15/22 2319)    ED Course/ Medical Decision Making/ A&P                           Medical Decision Making Amount and/or Complexity of Data Reviewed Labs: ordered. Radiology: ordered.  Risk Prescription drug management.   This patient presents to the ED with chief complaint(s) of cough, DIB with pertinent past medical history of, COPD, OSA which further complicates the presenting complaint. The complaint involves an extensive differential diagnosis and also carries with it a high risk of complications and morbidity.    The differential diagnosis includes but not limited to In my evaluation of this patient's dyspnea my DDx includes, but is not limited to, pneumonia, pulmonary embolism, pneumothorax, pulmonary edema, metabolic acidosis, asthma, COPD, cardiac cause, anemia, anxiety, etc.  . Serious etiologies were considered.   The initial plan is to screening labs/xr/duoneb/steroids/mag/re-assess   Additional history obtained: Additional history obtained from  na Records reviewed  primary care notes, prior labs and imaging, home medications Follows with [facility]   Independent labs interpretation:  The following labs were independently interpreted:  VBG stable RVP was negative, CBC with hemoglobin similar to her baseline, no leukocytosis.  Remainder of labs pending  Independent visualization of imaging: - I independently visualized the following  imaging with scope of interpretation limited to determining acute life threatening conditions related to emergency care: CXR, which revealed ?airspace dz vs atelectasis RML  Cardiac monitoring was reviewed and interpreted by myself which shows sinus tachy, improved  Treatment and Reassessment: DuoNeb, Solu-Medrol, mag sulfate, rocephin/azithro >> Wheezing improved, she is not hypoxic, will repeat DuoNeb  Consultation: - Consulted or discussed management/test interpretation w/ external professional: na  Consideration for admission or further workup: Admission was considered   Pt signed out to incoming EDP pending rpt neb, remainder of lab work and eventual dispo. Abx sent to pharmacy for possible pna/asthma/copd exacerbation. She does not need refill on her home meds. She is not hypoxic. She is HDS.   PORT/PSI score is 63      Social Determinants of health: Social History   Tobacco Use   Smoking status: Former    Packs/day: 0.50    Years: 10.00    Total pack years: 5.00    Types: Cigarettes    Quit date: 06/15/1998    Years since quitting: 23.8   Smokeless tobacco: Never  Vaping Use   Vaping Use: Never used  Substance Use Topics   Alcohol use: Never   Drug use: Never            Final Clinical Impression(s) / ED Diagnoses Final diagnoses:  Acute exacerbation of COPD with asthma ([place])  Pneumonia of right middle lobe due to infectious organism    Rx / DC Orders ED Discharge Orders  Ordered    predniSONE (DELTASONE) 50 MG tablet  Daily        04/15/22 2316    amoxicillin-clavulanate (AUGMENTIN) 875-125 MG tablet  Every 12 hours        04/15/22 2316    azithromycin (ZITHROMAX) 250 MG tablet  Daily        04/15/22 2316    guaiFENesin (ROBITUSSIN) 100 MG/5ML liquid  Every 4 hours PRN        04/15/22 2316              Jeanell Sparrow, DO 04/15/22 2331

## 2022-04-15 NOTE — ED Triage Notes (Signed)
Patient here POV from Home.  Endorses SOB related to Asthma since Saturday. Symptoms have worsened since they began. Wheezing Audible upon Ausculation.   SOB in Triage. A&Ox4. GCS 15. Ambulatory.

## 2022-04-16 ENCOUNTER — Ambulatory Visit: Payer: Self-pay

## 2022-04-16 LAB — TROPONIN I (HIGH SENSITIVITY): Troponin I (High Sensitivity): 7 ng/L (ref <18)

## 2022-04-16 NOTE — ED Provider Notes (Signed)
Care of the patient assumed at the change of shift. Here for SOB, wheezing. COPD exacerbation with possible PNA on CXR. She is feeling better now and comfortable going home. Rx for Abx and steroids from previous provider. RTED for any other concerns.    Truddie Hidden, MD 04/16/22 [phone]

## 2022-04-17 ENCOUNTER — Other Ambulatory Visit: Payer: Self-pay

## 2022-04-17 ENCOUNTER — Inpatient Hospital Stay (HOSPITAL_COMMUNITY)
Admission: EM | Admit: 2022-04-17 | Discharge: 2022-04-21 | DRG: 202 | Disposition: A | Discharge status: Home / Self Care | Payer: Medicare Other | Attending: Internal Medicine | Admitting: Internal Medicine

## 2022-04-17 ENCOUNTER — Encounter (HOSPITAL_COMMUNITY): Payer: Self-pay

## 2022-04-17 ENCOUNTER — Emergency Department (HOSPITAL_COMMUNITY): Payer: Medicare Other

## 2022-04-17 DIAGNOSIS — J8283 Eosinophilic asthma: Secondary | ICD-10-CM | POA: Diagnosis present

## 2022-04-17 DIAGNOSIS — Z79890 Hormone replacement therapy (postmenopausal): Secondary | ICD-10-CM

## 2022-04-17 DIAGNOSIS — Z7951 Long term (current) use of inhaled steroids: Secondary | ICD-10-CM

## 2022-04-17 DIAGNOSIS — E119 Type 2 diabetes mellitus without complications: Secondary | ICD-10-CM

## 2022-04-17 DIAGNOSIS — Z79891 Long term (current) use of opiate analgesic: Secondary | ICD-10-CM

## 2022-04-17 DIAGNOSIS — K219 Gastro-esophageal reflux disease without esophagitis: Secondary | ICD-10-CM | POA: Diagnosis present

## 2022-04-17 DIAGNOSIS — J4541 Moderate persistent asthma with (acute) exacerbation: Principal | ICD-10-CM | POA: Diagnosis present

## 2022-04-17 DIAGNOSIS — I10 Essential (primary) hypertension: Secondary | ICD-10-CM | POA: Diagnosis present

## 2022-04-17 DIAGNOSIS — F419 Anxiety disorder, unspecified: Secondary | ICD-10-CM | POA: Diagnosis present

## 2022-04-17 DIAGNOSIS — M1990 Unspecified osteoarthritis, unspecified site: Secondary | ICD-10-CM | POA: Diagnosis present

## 2022-04-17 DIAGNOSIS — J45901 Unspecified asthma with (acute) exacerbation: Secondary | ICD-10-CM | POA: Diagnosis not present

## 2022-04-17 DIAGNOSIS — Z96642 Presence of left artificial hip joint: Secondary | ICD-10-CM | POA: Diagnosis present

## 2022-04-17 DIAGNOSIS — Z7952 Long term (current) use of systemic steroids: Secondary | ICD-10-CM

## 2022-04-17 DIAGNOSIS — Z6839 Body mass index (BMI) 39.0-39.9, adult: Secondary | ICD-10-CM

## 2022-04-17 DIAGNOSIS — Z7985 Long-term (current) use of injectable non-insulin antidiabetic drugs: Secondary | ICD-10-CM

## 2022-04-17 DIAGNOSIS — G4733 Obstructive sleep apnea (adult) (pediatric): Secondary | ICD-10-CM | POA: Diagnosis present

## 2022-04-17 DIAGNOSIS — E669 Obesity, unspecified: Secondary | ICD-10-CM | POA: Diagnosis present

## 2022-04-17 DIAGNOSIS — J441 Chronic obstructive pulmonary disease with (acute) exacerbation: Secondary | ICD-10-CM | POA: Diagnosis present

## 2022-04-17 DIAGNOSIS — Z79899 Other long term (current) drug therapy: Secondary | ICD-10-CM

## 2022-04-17 DIAGNOSIS — Z9884 Bariatric surgery status: Secondary | ICD-10-CM

## 2022-04-17 DIAGNOSIS — Z8249 Family history of ischemic heart disease and other diseases of the circulatory system: Secondary | ICD-10-CM

## 2022-04-17 DIAGNOSIS — Z9682 Presence of neurostimulator: Secondary | ICD-10-CM

## 2022-04-17 DIAGNOSIS — Z1152 Encounter for screening for COVID-19: Secondary | ICD-10-CM

## 2022-04-17 DIAGNOSIS — Z87891 Personal history of nicotine dependence: Secondary | ICD-10-CM

## 2022-04-17 LAB — CBC WITH DIFFERENTIAL/PLATELET
Abs Immature Granulocytes: 0.04 10*3/uL (ref 0.00–0.07)
Basophils Absolute: 0 10*3/uL (ref 0.0–0.1)
Basophils Relative: 0 %
Eosinophils Absolute: 0 10*3/uL (ref 0.0–0.5)
Eosinophils Relative: 0 %
HCT: 36.5 % (ref 36.0–46.0)
Hemoglobin: 11.2 g/dL — ABNORMAL LOW (ref 12.0–15.0)
Immature Granulocytes: 0 %
Lymphocytes Relative: 16 %
Lymphs Abs: 1.8 10*3/uL (ref 0.7–4.0)
MCH: 26.2 pg (ref 26.0–34.0)
MCHC: 30.7 g/dL (ref 30.0–36.0)
MCV: 85.3 fL (ref 80.0–100.0)
Monocytes Absolute: 0.6 10*3/uL (ref 0.1–1.0)
Monocytes Relative: 5 %
Neutro Abs: 9.1 10*3/uL — ABNORMAL HIGH (ref 1.7–7.7)
Neutrophils Relative %: 79 %
Platelets: 326 10*3/uL (ref 150–400)
RBC: 4.28 MIL/uL (ref 3.87–5.11)
RDW: 15.6 % — ABNORMAL HIGH (ref 11.5–15.5)
WBC: 11.6 10*3/uL — ABNORMAL HIGH (ref 4.0–10.5)
nRBC: 0 % (ref 0.0–0.2)

## 2022-04-17 LAB — D-DIMER, QUANTITATIVE: D-Dimer, Quant: <0.27 ug/mL-FEU (ref 0.00–0.50)

## 2022-04-17 LAB — TROPONIN I (HIGH SENSITIVITY)
Troponin I (High Sensitivity): 8 ng/L (ref <18)
Troponin I (High Sensitivity): 6 ng/L (ref <18)

## 2022-04-17 LAB — BASIC METABOLIC PANEL
Anion gap: 9 (ref 5–15)
BUN: 24 mg/dL — ABNORMAL HIGH (ref 8–23)
CO2: 27 mmol/L (ref 22–32)
Calcium: 9.7 mg/dL (ref 8.9–10.3)
Chloride: 107 mmol/L (ref 98–111)
Creatinine, Ser: 0.8 mg/dL (ref 0.44–1.00)
GFR, Estimated: >60 mL/min (ref >60)
Glucose, Bld: 98 mg/dL (ref 70–99)
Potassium: 3.8 mmol/L (ref 3.5–5.1)
Sodium: 143 mmol/L (ref 135–145)

## 2022-04-17 LAB — CBG MONITORING, ED: Glucose-Capillary: 184 mg/dL — ABNORMAL HIGH (ref 70–99)

## 2022-04-17 MED ORDER — LINACLOTIDE 145 MCG PO CAPS
290.0000 ug | ORAL_CAPSULE | Freq: Every day | ORAL | Status: DC
  Administered 2022-04-18 – 2022-04-21 (×4): 290 ug via ORAL
  Filled 2022-04-17 (×4): qty 2

## 2022-04-17 MED ORDER — ALBUTEROL SULFATE (2.5 MG/3ML) 0.083% IN NEBU
2.5000 mg | INHALATION_SOLUTION | Freq: Once | RESPIRATORY_TRACT | Status: AC
  Administered 2022-04-17: 2.5 mg via RESPIRATORY_TRACT

## 2022-04-17 MED ORDER — ALBUTEROL (5 MG/ML) CONTINUOUS INHALATION SOLN: 10.0000 mg/h | INHALATION_SOLUTION | Freq: Once | RESPIRATORY_TRACT | Status: DC

## 2022-04-17 MED ORDER — MELATONIN 3 MG PO TABS: 3.0000 mg | ORAL_TABLET | Freq: Every day | ORAL | Status: DC

## 2022-04-17 MED ORDER — ALBUTEROL SULFATE (2.5 MG/3ML) 0.083% IN NEBU
2.5000 mg | INHALATION_SOLUTION | Freq: Once | RESPIRATORY_TRACT | Status: AC
  Administered 2022-04-17: 2.5 mg via RESPIRATORY_TRACT
  Filled 2022-04-17: qty 3

## 2022-04-17 MED ORDER — INSULIN ASPART 100 UNIT/ML IJ SOLN
0.0000 [IU] | Freq: Three times a day (TID) | INTRAMUSCULAR | Status: DC
  Filled 2022-04-17: qty 0.06

## 2022-04-17 MED ORDER — ALBUTEROL SULFATE (2.5 MG/3ML) 0.083% IN NEBU: 2.5000 mg | INHALATION_SOLUTION | RESPIRATORY_TRACT | Status: DC | PRN

## 2022-04-17 MED ORDER — IPRATROPIUM-ALBUTEROL 0.5-2.5 (3) MG/3ML IN SOLN
3.0000 mL | Freq: Once | RESPIRATORY_TRACT | Status: AC
  Administered 2022-04-17: 3 mL via RESPIRATORY_TRACT
  Filled 2022-04-17: qty 3

## 2022-04-17 MED ORDER — HYDROCHLOROTHIAZIDE 25 MG PO TABS
25.0000 mg | ORAL_TABLET | Freq: Every day | ORAL | Status: DC
  Administered 2022-04-18 – 2022-04-21 (×4): 25 mg via ORAL
  Filled 2022-04-17 (×4): qty 1

## 2022-04-17 MED ORDER — LACTATED RINGERS IV BOLUS
1000.0000 mL | Freq: Once | INTRAVENOUS | Status: AC
  Administered 2022-04-17: 1000 mL via INTRAVENOUS

## 2022-04-17 MED ORDER — PANTOPRAZOLE SODIUM 40 MG PO TBEC
40.0000 mg | DELAYED_RELEASE_TABLET | Freq: Every day | ORAL | Status: DC
  Administered 2022-04-18 – 2022-04-21 (×4): 40 mg via ORAL
  Filled 2022-04-17 (×4): qty 1

## 2022-04-17 MED ORDER — AMLODIPINE BESYLATE 5 MG PO TABS
5.0000 mg | ORAL_TABLET | Freq: Every day | ORAL | Status: DC
  Administered 2022-04-18 – 2022-04-21 (×4): 5 mg via ORAL
  Filled 2022-04-17 (×4): qty 1

## 2022-04-17 MED ORDER — ACETAMINOPHEN 650 MG RE SUPP: 650.0000 mg | Freq: Four times a day (QID) | RECTAL | Status: DC | PRN

## 2022-04-17 MED ORDER — METHYLPREDNISOLONE SODIUM SUCC 125 MG IJ SOLR
125.0000 mg | Freq: Two times a day (BID) | INTRAMUSCULAR | Status: DC
  Administered 2022-04-18: 125 mg via INTRAVENOUS
  Filled 2022-04-17: qty 2

## 2022-04-17 MED ORDER — METHYLPREDNISOLONE SODIUM SUCC 125 MG IJ SOLR
125.0000 mg | Freq: Once | INTRAMUSCULAR | Status: AC
  Administered 2022-04-17: 125 mg via INTRAVENOUS
  Filled 2022-04-17: qty 2

## 2022-04-17 MED ORDER — IPRATROPIUM-ALBUTEROL 0.5-2.5 (3) MG/3ML IN SOLN
3.0000 mL | Freq: Four times a day (QID) | RESPIRATORY_TRACT | Status: DC
  Administered 2022-04-17 – 2022-04-19 (×9): 3 mL via RESPIRATORY_TRACT
  Filled 2022-04-17 (×9): qty 3

## 2022-04-17 MED ORDER — LOSARTAN POTASSIUM-HCTZ 100-25 MG PO TABS: 1.0000 | ORAL_TABLET | Freq: Every day | ORAL | Status: DC

## 2022-04-17 MED ORDER — SODIUM CHLORIDE 0.9 % IV SOLN
500.0000 mg | INTRAVENOUS | Status: AC
  Administered 2022-04-17: 500 mg via INTRAVENOUS
  Filled 2022-04-17: qty 5

## 2022-04-17 MED ORDER — ENOXAPARIN SODIUM 60 MG/0.6ML IJ SOSY
60.0000 mg | PREFILLED_SYRINGE | INTRAMUSCULAR | Status: DC
  Administered 2022-04-17 – 2022-04-20 (×4): 60 mg via SUBCUTANEOUS
  Filled 2022-04-17 (×4): qty 0.6

## 2022-04-17 MED ORDER — LEVOTHYROXINE SODIUM 100 MCG PO TABS
100.0000 ug | ORAL_TABLET | Freq: Every day | ORAL | Status: DC
  Administered 2022-04-18 – 2022-04-21 (×4): 100 ug via ORAL
  Filled 2022-04-17 (×4): qty 1

## 2022-04-17 MED ORDER — LOSARTAN POTASSIUM 50 MG PO TABS
100.0000 mg | ORAL_TABLET | Freq: Every day | ORAL | Status: DC
  Administered 2022-04-18 – 2022-04-21 (×4): 100 mg via ORAL
  Filled 2022-04-17 (×4): qty 2

## 2022-04-17 MED ORDER — GUAIFENESIN 100 MG/5ML PO LIQD
5.0000 mL | ORAL | Status: DC | PRN
  Administered 2022-04-18: 5 mL via ORAL
  Filled 2022-04-17: qty 10

## 2022-04-17 MED ORDER — PREDNISONE 20 MG PO TABS: 40.0000 mg | ORAL_TABLET | Freq: Every day | ORAL | Status: DC

## 2022-04-17 MED ORDER — INSULIN ASPART 100 UNIT/ML IJ SOLN
0.0000 [IU] | Freq: Every day | INTRAMUSCULAR | Status: DC
  Filled 2022-04-17: qty 0.05

## 2022-04-17 MED ORDER — ACETAMINOPHEN 325 MG PO TABS
650.0000 mg | ORAL_TABLET | Freq: Four times a day (QID) | ORAL | Status: DC | PRN
  Administered 2022-04-18 – 2022-04-19 (×2): 650 mg via ORAL
  Filled 2022-04-17 (×2): qty 2

## 2022-04-17 MED ORDER — AZITHROMYCIN 250 MG PO TABS
500.0000 mg | ORAL_TABLET | Freq: Every day | ORAL | Status: DC
  Administered 2022-04-18 – 2022-04-20 (×3): 500 mg via ORAL
  Filled 2022-04-17 (×3): qty 2

## 2022-04-17 MED ORDER — ATORVASTATIN CALCIUM 20 MG PO TABS
20.0000 mg | ORAL_TABLET | Freq: Every day | ORAL | Status: DC
  Administered 2022-04-18 – 2022-04-21 (×4): 20 mg via ORAL
  Filled 2022-04-17 (×4): qty 1

## 2022-04-17 MED ORDER — IPRATROPIUM-ALBUTEROL 0.5-2.5 (3) MG/3ML IN SOLN: 3.0000 mL | Freq: Once | RESPIRATORY_TRACT | Status: DC

## 2022-04-17 MED ORDER — ALBUTEROL SULFATE (2.5 MG/3ML) 0.083% IN NEBU
INHALATION_SOLUTION | RESPIRATORY_TRACT | Status: AC
  Filled 2022-04-17: qty 12

## 2022-04-17 MED ORDER — OXYCODONE HCL 5 MG PO TABS
5.0000 mg | ORAL_TABLET | ORAL | Status: DC | PRN
  Administered 2022-04-18 – 2022-04-19 (×4): 5 mg via ORAL
  Filled 2022-04-17 (×4): qty 1

## 2022-04-17 MED ORDER — POLYETHYLENE GLYCOL 3350 17 G PO PACK: 17.0000 g | PACK | Freq: Every day | ORAL | Status: DC | PRN

## 2022-04-17 NOTE — H&P (Signed)
History and Physical    Joey Hudock ELF:[id number] DOB: [birth year] DOA: 04/17/2022  PCP: [place], Raymond J, MD   Patient coming from: Home   Chief Complaint: Cough, SOB   HPI: Patty Howard is a 66 y.o. female with medical history significant for asthma with COPD, OSA not currently on CPAP, hypertension, and type 2 diabetes mellitus who presents emergency department with shortness of breath, cough, and chest tightness.  Patient reports developing the symptoms on 04/11/2022.  She was seen in the emergency department the night of 04/15/2022 where she was treated with nebs, systemic steroids, antibiotics, and went home feeling improved.  She quickly began to worsen again, prompting her return today.    ED Course: Upon arrival to the ED, patient is found to be afebrile and saturating well on room air with tachypnea, tachycardia, and stable blood pressure.  EKG features sinus tachycardia with PAC.  Chest x-ray negative for acute cardiopulmonary disease.  Blood work notable for elevated BUN to creatinine ratio, undetectable D-dimer, and normal troponin x2.  She was given a liter of LR, 2 duo nebs, 2 albuterol treatments, and 125 mg of IV Solu-Medrol in the ED. She did not want to receive IV magnesium.   Review of Systems:  All other systems reviewed and apart from HPI, are negative.  Past Medical History:  Diagnosis Date   Anxiety    Arthritis    Asthma    COPD (chronic obstructive pulmonary disease) ([place])    Fracture    closed displaced left radial head   GERD (gastroesophageal reflux disease)    Hypertension    Sleep apnea    does not wear CPAP    Past Surgical History:  Procedure Laterality Date   BACK SURGERY     spinal stimulator   BREAST BIOPSY Right 2017   benign   COLONOSCOPY W/ BIOPSIES AND POLYPECTOMY     DILATION AND CURETTAGE OF UTERUS     LAPAROSCOPIC ROUX-EN-Y GASTRIC BYPASS WITH UPPER ENDOSCOPY AND REMOVAL OF LAP BAND     RADIAL HEAD ARTHROPLASTY  Left 02/03/2019   Procedure: LEFT RADIAL HEAD ARTHROPLASTY;  Surgeon: [name], MD;  Location: [facility];  Service: Orthopedics;  Laterality: Left;  AXILLARY BLOCK VS BIER BLOCK   TOTAL HIP ARTHROPLASTY Left 01/22/2021   Procedure: LEFT TOTAL HIP ARTHROPLASTY ANTERIOR APPROACH;  Surgeon: [name], MD;  Location: [facility];  Service: Orthopedics;  Laterality: Left;   TUBAL LIGATION      Social History:   reports that she quit smoking about 23 years ago. Her smoking use included cigarettes. She has a 5.00 pack-year smoking history. She has never used smokeless tobacco. She reports that she does not drink alcohol and does not use drugs.  No Known Allergies  Family History  Problem Relation Age of Onset   Hypertension Mother    Stroke Mother    Cancer Father      Prior to Admission medications   Medication Sig Start Date End Date Taking? Authorizing Provider  albuterol (PROVENTIL) (2.5 MG/3ML) 0.083% nebulizer solution Take 3 mLs (2.5 mg total) by nebulization every 6 (six) hours as needed for wheezing or shortness of breath. 04/23/21   Clemon Chambers, MD  albuterol (VENTOLIN HFA) 108 (90 Base) MCG/ACT inhaler Inhale 1-2 puffs into the lungs every 6 (six) hours as needed for wheezing or shortness of breath. 11/27/21   Valentina Shaggy, MD  amLODipine (NORVASC) 5 MG tablet Take 1 tablet (5 mg total) by  mouth daily. 10/21/21   Duke, Tami Lin, PA  amoxicillin-clavulanate (AUGMENTIN) 875-125 MG tablet Take 1 tablet by mouth every 12 (twelve) hours. 04/16/22   Jeanell Sparrow, DO  ascorbic acid (VITAMIN C) 500 MG tablet Take 500 mg by mouth daily.    [provider]  atorvastatin (LIPITOR) 20 MG tablet Take 1 tablet (20 mg total) by mouth daily. 10/23/21   Duke, Tami Lin, PA  azithromycin (ZITHROMAX) 250 MG tablet Take 1 tablet (250 mg total) by mouth daily. Take first 2 tablets together, then 1 every day until finished. 04/16/22   Jeanell Sparrow, DO  benzonatate (TESSALON) 200 MG  capsule Take 1 capsule (200 mg total) by mouth 3 (three) times daily as needed for cough. 01/01/22   Parrett, Fonnie [place], NP  Diethylpropion HCl CR 75 MG TB24 1 tablet in the mid morning 10/16/21   [provider]  Fluticasone-Umeclidin-Vilant (TRELEGY ELLIPTA) 200-62.5-25 MCG/ACT AEPB Inhale 1 puff daily 12/05/21   [place], Blondell Reveal, MD  guaiFENesin (ROBITUSSIN) 100 MG/5ML liquid Take 5 mLs by mouth every 4 (four) hours as needed for cough or to loosen phlegm. 04/15/22   Jeanell Sparrow, DO  ibuprofen (ADVIL) 800 MG tablet Take 1 tablet (800 mg total) by mouth every 8 (eight) hours as needed for moderate pain. 02/17/22   [place], Blondell Reveal, MD  ipratropium-albuterol (DUONEB) 0.5-2.5 (3) MG/3ML SOLN Take 3 mLs by nebulization every 6 (six) hours as needed. Patient taking differently: Take 3 mLs by nebulization every 6 (six) hours as needed (Wheezing, shortness of breath). 04/23/21   Clemon Chambers, MD  levocetirizine (XYZAL) 5 MG tablet Take 1 tablet (5 mg total) by mouth every evening. 04/23/21   Clemon Chambers, MD  levothyroxine (SYNTHROID) 100 MCG tablet Take 100 mcg by mouth daily before breakfast.    [provider]  losartan-hydrochlorothiazide (HYZAAR) 100-25 MG tablet Take 1 tablet by mouth daily. 10/21/21   D[facility], Tami Lin, PA  Multiple Vitamin (MULTIVITAMIN WITH MINERALS) TABS tablet Take 1 tablet by mouth daily. Centrum Multivitamin    [provider]  pantoprazole (PROTONIX) 40 MG tablet Take 1 tablet (40 mg total) by mouth daily. 11/27/21   Valentina Shaggy, MD  potassium chloride SA (KLOR-CON M) 20 MEQ tablet Take 1 tablet (20 mEq total) by mouth daily. 09/30/21 10/30/21  Barb Merino, MD  predniSONE (DELTASONE) 50 MG tablet Take 1 tablet (50 mg total) by mouth daily for 5 days. 04/16/22 04/21/22  Jeanell Sparrow, DO  Semaglutide, 1 MG/DOSE, 4 MG/3ML SOPN Inject 1 mg as directed once a week. 02/17/22   [place], Blondell Reveal, MD  traMADol (ULTRAM) 50 MG tablet Take 1 tablet  (50 mg total) by mouth every 12 (twelve) hours as needed. 02/27/22   [name], MD  Vitamin D, Ergocalciferol, (DRISDOL) 1.25 MG (50000 UNIT) CAPS capsule Take 50,000 Units by mouth every Monday.  10/27/19   [provider]  zafirlukast (ACCOLATE) 20 MG tablet TAKE 1 TABLET (20 MG TOTAL) BY MOUTH 2 (TWO) TIMES DAILY BEFORE A MEAL. 02/20/22 03/22/22  Valentina Shaggy, MD    Physical Exam: Vitals:   04/17/22 1720 04/17/22 1800 04/17/22 1830 04/17/22 1938  BP: 131/86 (!) 143/84 (!) 152/130   Pulse: 98 (!) 105 (!) 104   Resp: 19 17 18    Temp:      TempSrc:      SpO2: 97% 98% 96% 100%    Constitutional: NAD, no pallor  or diaphoresis  Eyes: PERTLA, lids and conjunctivae normal ENMT: Mucous membranes are moist. Posterior pharynx clear of any exudate or lesions.   Neck: supple, no masses  Respiratory: Prolonged expiratory phase with wheezing. Speaking full sentences.  Cardiovascular: S1 & S2 heard, regular rate and rhythm. No JVD.    Abdomen: No distension, no tenderness, soft. Bowel sounds active.  Musculoskeletal: no clubbing / cyanosis. No joint deformity upper and lower extremities.   Skin: no significant rashes, lesions, ulcers. Warm, dry, well-perfused. Neurologic: CN 2-12 grossly intact. Moving all extremities. Alert and oriented.  Psychiatric: Calm. Cooperative.    Labs and Imaging on Admission: I have personally reviewed following labs and imaging studies  CBC: Recent Labs  Lab 04/15/22 2214 04/15/22 2215 04/17/22 1247  WBC  --  5.8 11.6*  NEUTROABS  --  3.7 9.1*  HGB 12.2 11.4* 11.2*  HCT 36.0 35.8* 36.5  MCV  --  81.9 85.3  PLT  --  298 784   Basic Metabolic Panel: Recent Labs  Lab 04/15/22 2214 04/15/22 2215 04/17/22 1247  NA 140 141 143  K 3.6 3.5 3.8  CL  --  103 107  CO2  --  27 27  GLUCOSE  --  94 98  BUN  --  14 24*  CREATININE  --  0.90 0.80  CALCIUM  --  9.7 9.7   GFR: Estimated Creatinine Clearance: 97.2 mL/min (by C-G formula based  on SCr of 0.8 mg/dL). Liver Function Tests: No results for input(s): "AST", "ALT", "ALKPHOS", "BILITOT", "PROT", "ALBUMIN" in the last 168 hours. No results for input(s): "LIPASE", "AMYLASE" in the last 168 hours. No results for input(s): "AMMONIA" in the last 168 hours. Coagulation Profile: No results for input(s): "INR", "PROTIME" in the last 168 hours. Cardiac Enzymes: No results for input(s): "CKTOTAL", "CKMB", "CKMBINDEX", "TROPONINI" in the last 168 hours. BNP (last 3 results) No results for input(s): "PROBNP" in the last 8760 hours. HbA1C: No results for input(s): "HGBA1C" in the last 72 hours. CBG: No results for input(s): "GLUCAP" in the last 168 hours. Lipid Profile: No results for input(s): "CHOL", "HDL", "LDLCALC", "TRIG", "CHOLHDL", "LDLDIRECT" in the last 72 hours. Thyroid Function Tests: No results for input(s): "TSH", "T4TOTAL", "FREET4", "T3FREE", "THYROIDAB" in the last 72 hours. Anemia Panel: No results for input(s): "VITAMINB12", "FOLATE", "FERRITIN", "TIBC", "IRON", "RETICCTPCT" in the last 72 hours. Urine analysis:    Component Value Date/Time   COLORURINE YELLOW 01/16/2021 1055   APPEARANCEUR CLOUDY (A) 01/16/2021 1055   LABSPEC 1.024 01/16/2021 1055   PHURINE 5.0 01/16/2021 1055   GLUCOSEU NEGATIVE 01/16/2021 1055   HGBUR SMALL (A) 01/16/2021 1055   BILIRUBINUR NEGATIVE 01/16/2021 1055   KETONESUR NEGATIVE 01/16/2021 1055   PROTEINUR 30 (A) 01/16/2021 1055   NITRITE NEGATIVE 01/16/2021 1055   LEUKOCYTESUR NEGATIVE 01/16/2021 1055   Sepsis Labs: @LABRCNTIP (procalcitonin:4,lacticidven:4) ) Recent Results (from the past 240 hour(s))  Resp Panel by RT-PCR (Flu A&B, Covid) Anterior Nasal Swab     Status: None   Collection Time: 04/15/22 10:15 PM   Specimen: Anterior Nasal Swab  Result Value Ref Range Status   SARS Coronavirus 2 by RT PCR NEGATIVE NEGATIVE Final    Comment: (NOTE) SARS-CoV-2 target nucleic acids are NOT DETECTED.  The SARS-CoV-2 RNA  is generally detectable in upper respiratory specimens during the acute phase of infection. The lowest concentration of SARS-CoV-2 viral copies this assay can detect is 138 copies/mL. A negative result does not preclude SARS-Cov-2 infection and should not be  used as the sole basis for treatment or other patient management decisions. A negative result may occur with  improper specimen collection/handling, submission of specimen other than nasopharyngeal swab, presence of viral mutation(s) within the areas targeted by this assay, and inadequate number of viral copies(<138 copies/mL). A negative result must be combined with clinical observations, patient history, and epidemiological information. The expected result is Negative.  Fact Sheet for Patients:  EntrepreneurPulse.com.au  Fact Sheet for Healthcare Providers:  IncredibleEmployment.be  This test is no t yet approved or cleared by the [place] FDA and  has been authorized for detection and/or diagnosis of SARS-CoV-2 by FDA under an Emergency Use Authorization (EUA). This EUA will remain  in effect (meaning this test can be used) for the duration of the COVID-19 declaration under Section 564(b)(1) of the Act, 21 U.S.C.section 360bbb-3(b)(1), unless the authorization is terminated  or revoked sooner.       Influenza A by PCR NEGATIVE NEGATIVE Final   Influenza B by PCR NEGATIVE NEGATIVE Final    Comment: (NOTE) The Xpert Xpress SARS-CoV-2/FLU/RSV plus assay is intended as an aid in the diagnosis of influenza from Nasopharyngeal swab specimens and should not be used as a sole basis for treatment. Nasal washings and aspirates are unacceptable for Xpert Xpress SARS-CoV-2/FLU/RSV testing.  Fact Sheet for Patients: EntrepreneurPulse.com.au  Fact Sheet for Healthcare Providers: IncredibleEmployment.be  This test is not yet approved or cleared by the  [place] FDA and has been authorized for detection and/or diagnosis of SARS-CoV-2 by FDA under an Emergency Use Authorization (EUA). This EUA will remain in effect (meaning this test can be used) for the duration of the COVID-19 declaration under Section 564(b)(1) of the Act, 21 U.S.C. section 360bbb-3(b)(1), unless the authorization is terminated or revoked.  Performed at [facility], [address], [place], [place] [address]      Radiological Exams on Admission: DG Chest 2 View  Result Date: 04/17/2022 CLINICAL DATA:  Provided history: Shortness of breath. EXAM: CHEST - 2 VIEW COMPARISON:  Prior chest radiographs 04/15/2022 and earlier. FINDINGS: Heart size within normal limits. Aortic atherosclerosis. No appreciable airspace consolidation or pulmonary edema. No evidence of pleural effusion or pneumothorax. No acute bony abnormality identified. Prior gastric band procedure. IMPRESSION: No evidence of acute cardiopulmonary abnormality. Aortic Atherosclerosis (ICD10-I70.0). Electronically Signed   By: Kellie Simmering D.O.   On: 04/17/2022 13:32   DG Chest Portable 1 View  Result Date: 04/15/2022 CLINICAL DATA:  Shortness of breath EXAM: PORTABLE CHEST 1 VIEW COMPARISON:  Chest x-ray 10/27/2021 FINDINGS: There is minimal atelectasis/airspace disease in the right middle lobe. No pleural effusion or pneumothorax. Cardiomediastinal silhouette is within normal limits. No acute fractures. IMPRESSION: Minimal atelectasis/airspace disease in the right middle lobe. Electronically Signed   By: Ronney Asters M.D.   On: 04/15/2022 21:53    EKG: Independently reviewed. Sinus tachycardia, rate 106, PAC.   Assessment/Plan  1. Acute exacerbation of COPD with asthma  - Culture sputum, start azithromycin, continue systemic steroids, schedule SABA-SAMA, continue prn SABA    2. Chest pain  - Serial troponin normal, d-dimer undetectable, and EKG non-ischemic  - Secondary to #1,  continue treatment as above -   3. HTN  - Continue Norvasc, losartan-HCTZ    4. Type II DM  - A1c was 6.0% in May 2023  - Check CBGs and use low-intensity SSI    DVT prophylaxis: Lovenox  Code Status: Full  Level of Care: Level of care: Med-Surg Family Communication:  none present  Disposition Plan:  Patient is from: home  Anticipated d/c is to: home  Anticipated d/c date is: 11/4 or 04/19/22  Patient currently: Pending improved respiratory status and transition to oral medications  Consults called: none  Admission status: Observation     Vianne Bulls, MD [facility]  04/17/2022, 7:51 PM

## 2022-04-17 NOTE — ED Provider Notes (Signed)
[facility] DEPT Provider Note   CSN: [id number] Arrival date & time: 04/17/22  1204     History  Chief Complaint  Patient presents with   Shortness of Breath    Patty Howard is a 66 y.o. female.  HPI 66 year old female with a history of COPD presents with continued dyspnea, as well as right-sided chest pain and near syncope.  She has been dealing with cough and COPD for about 6 days.  Was seen at [facility] a couple days ago and put on antibiotics, steroids and has been taking her albuterol nebulizer 3 times a day.  She has an incentive spirometer but is only doing about a third of what she can normally do.  She has not had a fever.  The cough has become productive with a little bit of yellow sputum.  Today she had such a bad coughing episode she was feeling more short of breath and her heart was racing and she was lightheaded like she might pass out.  Son brought her here.  No leg swelling, fevers, vomiting.  She has right-sided chest and back pain, worse with inspiration.  Home Medications Prior to Admission medications   Medication Sig Start Date End Date Taking? Authorizing Provider  albuterol (PROVENTIL) (2.5 MG/3ML) 0.083% nebulizer solution Take 3 mLs (2.5 mg total) by nebulization every 6 (six) hours as needed for wheezing or shortness of breath. 04/23/21  Yes Clemon Chambers, MD  albuterol (VENTOLIN HFA) 108 (90 Base) MCG/ACT inhaler Inhale 1-2 puffs into the lungs every 6 (six) hours as needed for wheezing or shortness of breath. 11/27/21  Yes Valentina Shaggy, MD  amLODipine (NORVASC) 5 MG tablet Take 1 tablet (5 mg total) by mouth daily. 10/21/21  Yes Duke, Tami Lin, PA  amoxicillin-clavulanate (AUGMENTIN) 875-125 MG tablet Take 1 tablet by mouth every 12 (twelve) hours. 04/16/22  Yes Wynona Dove A, DO  ascorbic acid (VITAMIN C) 500 MG tablet Take 500 mg by mouth daily.   Yes [provider]  atorvastatin (LIPITOR) 20 MG  tablet Take 1 tablet (20 mg total) by mouth daily. 10/23/21  Yes Duke, Tami Lin, PA  azithromycin (ZITHROMAX) 250 MG tablet Take 1 tablet (250 mg total) by mouth daily. Take first 2 tablets together, then 1 every day until finished. 04/16/22  Yes Wynona Dove A, DO  benzonatate (TESSALON) 200 MG capsule Take 1 capsule (200 mg total) by mouth 3 (three) times daily as needed for cough. 01/01/22  Yes Parrett, Tammy S, NP  Dupilumab (DUPIXENT  [place]) Inject 1 Syringe into the skin every 14 (fourteen) days.   Yes [provider]  Fluticasone-Umeclidin-Vilant (TRELEGY ELLIPTA) 200-62.5-25 MCG/ACT AEPB Inhale 1 puff daily Patient taking differently: Inhale 1 puff into the lungs daily. 12/05/21  Yes de [place], Raymond J, MD  furosemide (LASIX) 40 MG tablet Take 40 mg by mouth daily.   Yes [provider]  guaiFENesin (ROBITUSSIN) 100 MG/5ML liquid Take 5 mLs by mouth every 4 (four) hours as needed for cough or to loosen phlegm. 04/15/22  Yes Wynona Dove A, DO  ibuprofen (ADVIL) 800 MG tablet Take 1 tablet (800 mg total) by mouth every 8 (eight) hours as needed for moderate pain. 02/17/22  Yes de [place], Raymond J, MD  ipratropium-albuterol (DUONEB) 0.5-2.5 (3) MG/3ML SOLN Take 3 mLs by nebulization every 6 (six) hours as needed. Patient taking differently: Take 3 mLs by nebulization every 6 (six) hours as needed (Wheezing, shortness of breath). 04/23/21  Yes Simona Huh,  Mila Merry, MD  levocetirizine (XYZAL) 5 MG tablet Take 1 tablet (5 mg total) by mouth every evening. 04/23/21  Yes Clemon Chambers, MD  levothyroxine (SYNTHROID) 100 MCG tablet Take 100 mcg by mouth daily before breakfast.   Yes [provider]  LINZESS 290 MCG CAPS capsule Take 290 mcg by mouth daily. 03/17/22  Yes [provider]  losartan-hydrochlorothiazide (HYZAAR) 100-25 MG tablet Take 1 tablet by mouth daily. 10/21/21  Yes Duke, Tami Lin, PA  Multiple Vitamin (MULTIVITAMIN WITH MINERALS) TABS tablet Take 1 tablet by  mouth daily. Centrum Multivitamin   Yes [provider]  pantoprazole (PROTONIX) 40 MG tablet Take 1 tablet (40 mg total) by mouth daily. 11/27/21  Yes Valentina Shaggy, MD  potassium chloride SA (KLOR-CON M) 20 MEQ tablet Take 1 tablet (20 mEq total) by mouth daily. 09/30/21 04/17/22 Yes Ghimire, Dante Gang, MD  predniSONE (DELTASONE) 50 MG tablet Take 1 tablet (50 mg total) by mouth daily for 5 days. 04/16/22 04/21/22 Yes Jeanell Sparrow, DO  Semaglutide, 1 MG/DOSE, 4 MG/3ML SOPN Inject 1 mg as directed once a week. Patient taking differently: Inject 1 mg into the skin once a week. 02/17/22  Yes de [place], Raymond J, MD  traMADol (ULTRAM) 50 MG tablet Take 1 tablet (50 mg total) by mouth every 12 (twelve) hours as needed. 02/27/22  Yes [name], MD  Vitamin D, Ergocalciferol, (DRISDOL) 1.25 MG (50000 UNIT) CAPS capsule Take 50,000 Units by mouth every Monday.  10/27/19  Yes [provider]  zafirlukast (ACCOLATE) 20 MG tablet TAKE 1 TABLET (20 MG TOTAL) BY MOUTH 2 (TWO) TIMES DAILY BEFORE A MEAL. Patient taking differently: Take 20 mg by mouth 2 (two) times daily. 02/20/22 04/17/22 Yes Valentina Shaggy, MD      Allergies    Patient has no known allergies.    Review of Systems   Review of Systems  Constitutional:  Negative for fever.  Respiratory:  Positive for cough and shortness of breath.   Cardiovascular:  Positive for chest pain. Negative for leg swelling.  Gastrointestinal:  Negative for vomiting.  Neurological:  Positive for light-headedness.    Physical Exam Updated Vital Signs BP 129/87 (BP Location: Left Arm)   Pulse 81   Temp (!) 97.4 F (36.3 C) (Oral)   Resp 15   SpO2 100%  Physical Exam Vitals and nursing note reviewed.  Constitutional:      Appearance: She is well-developed. She is obese. She is not ill-appearing or diaphoretic.  HENT:     Head: Normocephalic and atraumatic.  Cardiovascular:     Rate and Rhythm: Normal rate and regular rhythm.      Heart sounds: Normal heart sounds.  Pulmonary:     Effort: Pulmonary effort is normal. No tachypnea, accessory muscle usage or respiratory distress.     Breath sounds: Examination of the right-lower field reveals rales. Wheezing (diffuse, expiratory) and rales present.  Abdominal:     Palpations: Abdomen is soft.     Tenderness: There is no abdominal tenderness.  Musculoskeletal:     Right lower leg: No edema.     Left lower leg: No edema.  Skin:    General: Skin is warm and dry.  Neurological:     Mental Status: She is alert.     ED Results / Procedures / Treatments   Labs (all labs ordered are listed, but only abnormal results are displayed) Labs Reviewed  BASIC METABOLIC PANEL - Abnormal; Notable for  the following components:      Result Value   BUN 24 (*)    All other components within normal limits  CBC WITH DIFFERENTIAL/PLATELET - Abnormal; Notable for the following components:   WBC 11.6 (*)    Hemoglobin 11.2 (*)    RDW 15.6 (*)    Neutro Abs 9.1 (*)    All other components within normal limits  EXPECTORATED SPUTUM ASSESSMENT W GRAM STAIN, RFLX TO RESP C  D-DIMER, QUANTITATIVE  HIV ANTIBODY (ROUTINE TESTING W REFLEX)  BASIC METABOLIC PANEL  MAGNESIUM  CBC  TROPONIN I (HIGH SENSITIVITY)  TROPONIN I (HIGH SENSITIVITY)    EKG EKG Interpretation  Date/Time:  Friday April 17 2022 12:15:21 EDT Ventricular Rate:  106 PR Interval:  146 QRS Duration: 95 QT Interval:  358 QTC Calculation: 476 R Axis:   76 Text Interpretation: Sinus tachycardia Atrial premature complexes Probable left atrial enlargement Borderline repolarization abnormality similar to Apr 15 2022 Confirmed by Sherwood Gambler [phone]) on 04/17/2022 4:01:23 PM  Radiology DG Chest 2 View  Result Date: 04/17/2022 CLINICAL DATA:  Provided history: Shortness of breath. EXAM: CHEST - 2 VIEW COMPARISON:  Prior chest radiographs 04/15/2022 and earlier. FINDINGS: Heart size within normal limits. Aortic  atherosclerosis. No appreciable airspace consolidation or pulmonary edema. No evidence of pleural effusion or pneumothorax. No acute bony abnormality identified. Prior gastric band procedure. IMPRESSION: No evidence of acute cardiopulmonary abnormality. Aortic Atherosclerosis (ICD10-I70.0). Electronically Signed   By: Kellie Simmering D.O.   On: 04/17/2022 13:32   DG Chest Portable 1 View  Result Date: 04/15/2022 CLINICAL DATA:  Shortness of breath EXAM: PORTABLE CHEST 1 VIEW COMPARISON:  Chest x-ray 10/27/2021 FINDINGS: There is minimal atelectasis/airspace disease in the right middle lobe. No pleural effusion or pneumothorax. Cardiomediastinal silhouette is within normal limits. No acute fractures. IMPRESSION: Minimal atelectasis/airspace disease in the right middle lobe. Electronically Signed   By: Ronney Asters M.D.   On: 04/15/2022 21:53    Procedures Ultrasound ED Peripheral IV (Provider)  Date/Time: 04/17/2022 5:42 PM  Performed by: Sherwood Gambler, MD Authorized by: Sherwood Gambler, MD   Procedure details:    Indications: multiple failed IV attempts and poor IV access     Skin Prep: chlorhexidine gluconate     Location:  Right AC   Angiocath:  20 G   Bedside Ultrasound Guided: Yes     Images: archived     Patient tolerated procedure without complications: Yes     Dressing applied: Yes       Medications Ordered in ED Medications  albuterol (PROVENTIL) (2.5 MG/3ML) 0.083% nebulizer solution (  Not Given 04/17/22 1936)  insulin aspart (novoLOG) injection 0-6 Units (has no administration in time range)  insulin aspart (novoLOG) injection 0-5 Units (has no administration in time range)  azithromycin (ZITHROMAX) 500 mg in sodium chloride 0.9 % 250 mL IVPB (500 mg Intravenous New Bag/Given 04/17/22 2044)    Followed by  azithromycin (ZITHROMAX) tablet 500 mg (has no administration in time range)  methylPREDNISolone sodium succinate (SOLU-MEDROL) 125 mg/2 mL injection 125 mg (has no  administration in time range)    Followed by  predniSONE (DELTASONE) tablet 40 mg (has no administration in time range)  ipratropium-albuterol (DUONEB) 0.5-2.5 (3) MG/3ML nebulizer solution 3 mL (3 mLs Nebulization Given 04/17/22 2044)  albuterol (PROVENTIL) (2.5 MG/3ML) 0.083% nebulizer solution 2.5 mg (has no administration in time range)  enoxaparin (LOVENOX) injection 60 mg (has no administration in time range)  acetaminophen (TYLENOL) tablet 650 mg (  has no administration in time range)    Or  acetaminophen (TYLENOL) suppository 650 mg (has no administration in time range)  polyethylene glycol (MIRALAX / GLYCOLAX) packet 17 g (has no administration in time range)  oxyCODONE (Oxy IR/ROXICODONE) immediate release tablet 5 mg (has no administration in time range)  amLODipine (NORVASC) tablet 5 mg (has no administration in time range)  atorvastatin (LIPITOR) tablet 20 mg (has no administration in time range)  levothyroxine (SYNTHROID) tablet 100 mcg (has no administration in time range)  linaclotide (LINZESS) capsule 290 mcg (has no administration in time range)  pantoprazole (PROTONIX) EC tablet 40 mg (has no administration in time range)  guaiFENesin (ROBITUSSIN) 100 MG/5ML liquid 5 mL (has no administration in time range)  losartan (COZAAR) tablet 100 mg (has no administration in time range)    And  hydrochlorothiazide (HYDRODIURIL) tablet 25 mg (has no administration in time range)  ipratropium-albuterol (DUONEB) 0.5-2.5 (3) MG/3ML nebulizer solution 3 mL (3 mLs Nebulization Given 04/17/22 1257)  lactated ringers bolus 1,000 mL (0 mLs Intravenous Stopped 04/17/22 1848)  ipratropium-albuterol (DUONEB) 0.5-2.5 (3) MG/3ML nebulizer solution 3 mL (3 mLs Nebulization Given 04/17/22 1645)  methylPREDNISolone sodium succinate (SOLU-MEDROL) 125 mg/2 mL injection 125 mg (125 mg Intravenous Given 04/17/22 1746)  albuterol (PROVENTIL) (2.5 MG/3ML) 0.083% nebulizer solution 2.5 mg (2.5 mg Nebulization  Given 04/17/22 1645)  ipratropium-albuterol (DUONEB) 0.5-2.5 (3) MG/3ML nebulizer solution 3 mL (3 mLs Nebulization Given 04/17/22 1805)  albuterol (PROVENTIL) (2.5 MG/3ML) 0.083% nebulizer solution 2.5 mg (2.5 mg Nebulization Given 04/17/22 1806)  albuterol (PROVENTIL) (2.5 MG/3ML) 0.083% nebulizer solution 2.5 mg (2.5 mg Nebulization Given 04/17/22 1936)    ED Course/ Medical Decision Making/ A&P                           Medical Decision Making Amount and/or Complexity of Data Reviewed Labs: ordered.    Details: Normal troponin, mild leukocytosis which could be from infection versus steroids.  Mildly elevated BUN but normal creatinine, may be some dehydration.  D-dimer negative. Radiology: independent interpretation performed.    Details: Chest x-ray without obvious pneumonia ECG/medicine tests: independent interpretation performed.    Details: Nonspecific but chronic ST changes, no new ischemia seen  Risk Prescription drug management. Decision regarding hospitalization.   Patient presents with asthma exacerbation.  Wheezing has not improved and her symptoms are not improved despite multiple breathing treatments.  She is not distressed but intermittently tachycardic and short of breath.  Given IV Solu-Medrol, fluids.  She is asking for no magnesium but does not appear that ill that I think would make a big difference.  At this point I think she will need admission.  I did send a D-dimer given some pleuritic pain but with a negative D-dimer and otherwise significant wheezing I think PE is a lot less likely.  Discussed with Dr. Myna Hidalgo who will admit.        Final Clinical Impression(s) / ED Diagnoses Final diagnoses:  Moderate persistent asthma with exacerbation    Rx / DC Orders ED Discharge Orders     None         Sherwood Gambler, MD 04/17/22 2117

## 2022-04-17 NOTE — ED Provider Triage Note (Signed)
Emergency Medicine Provider Triage Evaluation Note  Patty Howard , a 66 y.o. female  was evaluated in triage.  Pt complains of cough, shortness of breath, chest pain.  Patient states she has had productive cough since Saturday last week.  3 days ago she developed shortness of breath and chest pain prompting her to go to [facility] emergency department.  She was put on antibiotics and has been taking them for 3 days with no improvement.  Endorses right flank pain with cough.  Denies nausea, vomiting, fever, bowel changes, urinary symptoms.  Review of Systems  Positive: As above Negative: As above  Physical Exam  BP (!) 151/75 (BP Location: Right Arm)   Pulse (!) 111   Temp 98.1 F (36.7 C) (Oral)   Resp 18   SpO2 97%  Gen:   Awake, no distress   Resp:  Normal effort, wheezing and rales present. MSK:   Moves extremities without difficulty  Other:  Heart rate 111.  Medical Decision Making  Medically screening exam initiated at 12:47 PM.  Appropriate orders placed.  Trista Ciocca was informed that the remainder of the evaluation will be completed by another provider, this initial triage assessment does not replace that evaluation, and the importance of remaining in the ED until their evaluation is complete.  Work-up initiated.Rex Kras, [place] 04/17/22 [phone]

## 2022-04-17 NOTE — ED Triage Notes (Signed)
Pt reports SHOB, centralized chest pain with non productive cough and right sided lung pain.  Pt seen 3 days ago for PNA and asthma and prescribed amoxicillin and prednisone with no improvement per patient

## 2022-04-17 NOTE — ED Notes (Signed)
Unsuccessful IV attempt x 3

## 2022-04-18 DIAGNOSIS — Z96642 Presence of left artificial hip joint: Secondary | ICD-10-CM | POA: Diagnosis present

## 2022-04-18 DIAGNOSIS — F419 Anxiety disorder, unspecified: Secondary | ICD-10-CM | POA: Diagnosis present

## 2022-04-18 DIAGNOSIS — J4541 Moderate persistent asthma with (acute) exacerbation: Secondary | ICD-10-CM

## 2022-04-18 DIAGNOSIS — G4733 Obstructive sleep apnea (adult) (pediatric): Secondary | ICD-10-CM | POA: Diagnosis present

## 2022-04-18 DIAGNOSIS — Z79899 Other long term (current) drug therapy: Secondary | ICD-10-CM | POA: Diagnosis not present

## 2022-04-18 DIAGNOSIS — J441 Chronic obstructive pulmonary disease with (acute) exacerbation: Secondary | ICD-10-CM | POA: Diagnosis present

## 2022-04-18 DIAGNOSIS — Z8249 Family history of ischemic heart disease and other diseases of the circulatory system: Secondary | ICD-10-CM | POA: Diagnosis not present

## 2022-04-18 DIAGNOSIS — Z7951 Long term (current) use of inhaled steroids: Secondary | ICD-10-CM | POA: Diagnosis not present

## 2022-04-18 DIAGNOSIS — J8283 Eosinophilic asthma: Secondary | ICD-10-CM | POA: Diagnosis present

## 2022-04-18 DIAGNOSIS — Z9682 Presence of neurostimulator: Secondary | ICD-10-CM | POA: Diagnosis not present

## 2022-04-18 DIAGNOSIS — M1990 Unspecified osteoarthritis, unspecified site: Secondary | ICD-10-CM | POA: Diagnosis present

## 2022-04-18 DIAGNOSIS — K219 Gastro-esophageal reflux disease without esophagitis: Secondary | ICD-10-CM | POA: Diagnosis present

## 2022-04-18 DIAGNOSIS — E119 Type 2 diabetes mellitus without complications: Secondary | ICD-10-CM | POA: Diagnosis present

## 2022-04-18 DIAGNOSIS — Z87891 Personal history of nicotine dependence: Secondary | ICD-10-CM | POA: Diagnosis not present

## 2022-04-18 DIAGNOSIS — Z6839 Body mass index (BMI) 39.0-39.9, adult: Secondary | ICD-10-CM | POA: Diagnosis not present

## 2022-04-18 DIAGNOSIS — Z79890 Hormone replacement therapy (postmenopausal): Secondary | ICD-10-CM | POA: Diagnosis not present

## 2022-04-18 DIAGNOSIS — J45901 Unspecified asthma with (acute) exacerbation: Secondary | ICD-10-CM | POA: Diagnosis not present

## 2022-04-18 DIAGNOSIS — Z1152 Encounter for screening for COVID-19: Secondary | ICD-10-CM | POA: Diagnosis not present

## 2022-04-18 DIAGNOSIS — E669 Obesity, unspecified: Secondary | ICD-10-CM | POA: Diagnosis present

## 2022-04-18 DIAGNOSIS — Z79891 Long term (current) use of opiate analgesic: Secondary | ICD-10-CM | POA: Diagnosis not present

## 2022-04-18 DIAGNOSIS — Z7985 Long-term (current) use of injectable non-insulin antidiabetic drugs: Secondary | ICD-10-CM | POA: Diagnosis not present

## 2022-04-18 DIAGNOSIS — Z9884 Bariatric surgery status: Secondary | ICD-10-CM | POA: Diagnosis not present

## 2022-04-18 DIAGNOSIS — I10 Essential (primary) hypertension: Secondary | ICD-10-CM | POA: Diagnosis present

## 2022-04-18 DIAGNOSIS — Z7952 Long term (current) use of systemic steroids: Secondary | ICD-10-CM | POA: Diagnosis not present

## 2022-04-18 HISTORY — DX: Chronic obstructive pulmonary disease with (acute) exacerbation: J44.1

## 2022-04-18 LAB — BASIC METABOLIC PANEL
Anion gap: 9 (ref 5–15)
BUN: 18 mg/dL (ref 8–23)
CO2: 26 mmol/L (ref 22–32)
Calcium: 9.4 mg/dL (ref 8.9–10.3)
Chloride: 106 mmol/L (ref 98–111)
Creatinine, Ser: 0.62 mg/dL (ref 0.44–1.00)
GFR, Estimated: >60 mL/min (ref >60)
Glucose, Bld: 124 mg/dL — ABNORMAL HIGH (ref 70–99)
Potassium: 4.5 mmol/L (ref 3.5–5.1)
Sodium: 141 mmol/L (ref 135–145)

## 2022-04-18 LAB — CBC
HCT: 34.1 % — ABNORMAL LOW (ref 36.0–46.0)
Hemoglobin: 10.6 g/dL — ABNORMAL LOW (ref 12.0–15.0)
MCH: 26.6 pg (ref 26.0–34.0)
MCHC: 31.1 g/dL (ref 30.0–36.0)
MCV: 85.5 fL (ref 80.0–100.0)
Platelets: 312 10*3/uL (ref 150–400)
RBC: 3.99 MIL/uL (ref 3.87–5.11)
RDW: 15.7 % — ABNORMAL HIGH (ref 11.5–15.5)
WBC: 9.8 10*3/uL (ref 4.0–10.5)
nRBC: 0 % (ref 0.0–0.2)

## 2022-04-18 LAB — EXPECTORATED SPUTUM ASSESSMENT W GRAM STAIN, RFLX TO RESP C

## 2022-04-18 LAB — HIV ANTIBODY (ROUTINE TESTING W REFLEX): HIV Screen 4th Generation wRfx: NONREACTIVE

## 2022-04-18 LAB — GLUCOSE, CAPILLARY
Glucose-Capillary: 123 mg/dL — ABNORMAL HIGH (ref 70–99)
Glucose-Capillary: 127 mg/dL — ABNORMAL HIGH (ref 70–99)
Glucose-Capillary: 103 mg/dL — ABNORMAL HIGH (ref 70–99)
Glucose-Capillary: 111 mg/dL — ABNORMAL HIGH (ref 70–99)

## 2022-04-18 LAB — MAGNESIUM: Magnesium: 2.2 mg/dL (ref 1.7–2.4)

## 2022-04-18 MED ORDER — METHYLPREDNISOLONE SODIUM SUCC 40 MG IJ SOLR
40.0000 mg | INTRAMUSCULAR | Status: DC
  Administered 2022-04-19 – 2022-04-21 (×3): 40 mg via INTRAVENOUS
  Filled 2022-04-18 (×3): qty 1

## 2022-04-18 MED ORDER — MELATONIN 3 MG PO TABS
3.0000 mg | ORAL_TABLET | Freq: Every day | ORAL | Status: DC
  Administered 2022-04-18 – 2022-04-20 (×4): 3 mg via ORAL
  Filled 2022-04-18 (×4): qty 1

## 2022-04-18 NOTE — Hospital Course (Signed)
66 y.o. female with medical history significant for asthma with COPD, OSA not currently on CPAP, hypertension, and type 2 diabetes mellitus who presents emergency department with shortness of breath, cough, and chest tightness.   Patient reports developing the symptoms on 04/11/2022.  She was seen in the emergency department the night of 04/15/2022 where she was treated with nebs, systemic steroids, antibiotics, and went home feeling improved.  She quickly began to worsen again, prompting her return

## 2022-04-18 NOTE — Progress Notes (Signed)
  Progress Note   Patient: Patty Howard ANV:[id number] DOB: January 23, 1956 DOA: 04/17/2022     0 DOS: the patient was seen and examined on 04/18/2022   Brief hospital course: 66 y.o. female with medical history significant for asthma with COPD, OSA not currently on CPAP, hypertension, and type 2 diabetes mellitus who presents emergency department with shortness of breath, cough, and chest tightness.   Patient reports developing the symptoms on 04/11/2022.  She was seen in the emergency department the night of 04/15/2022 where she was treated with nebs, systemic steroids, antibiotics, and went home feeling improved.  She quickly began to worsen again, prompting her return  Assessment and Plan: 1. Acute exacerbation of COPD with asthma  - Culture sputum, start azithromycin -although pt was seen on room air this AM, pt continued with marked wheezing and sob with patient having to sit upright, almost "tripodding" with audible wheezing heard from several feet away -Would recommend continued scheduled nebs and IV steroids for now -Encourage ambulation as tolerated   2. Chest pain  - Serial troponin normal, d-dimer undetectable, and EKG non-ischemic  - Secondary to #1, continue treatment as above -    3. HTN  - Continue Norvasc, losartan-HCTZ     4. Type II DM  - A1c was 6.0% in May 2023  - Check CBGs and use low-intensity SSI       Subjective: Still complaining of sob, feeling of phlegm stuck in her throat  Physical Exam: Vitals:   04/17/22 2322 04/18/22 0406 04/18/22 0834 04/18/22 1326  BP: (!) 139/93 138/83  (!) 142/88  Pulse: 93 81  65  Resp: 18 18  15   Temp: 98.3 F (36.8 C) 98.6 F (37 C)  98.1 F (36.7 C)  TempSrc: Oral Oral  Oral  SpO2: 98% 94% 96% 98%  Weight: 127.3 kg     Height: 5' 9"  (1.753 m)      General exam: Awake, laying in bed, in nad Respiratory system: Increased respiratory effort, audible wheezing Cardiovascular system: regular rate, s1,  s2 Gastrointestinal system: Soft, nondistended, positive BS Central nervous system: CN2-12 grossly intact, strength intact Extremities: Perfused, no clubbing Skin: Normal skin turgor, no notable skin lesions seen Psychiatry: Mood normal // no visual hallucinations   Data Reviewed:  Labs reviewed: Na 141, K 4.5, Cr 0.62, Hgb 10.6   Family Communication:  Pt in room, family not at bedside  Disposition: Status is: Observation The patient remains OBS appropriate and will d/c before 2 midnights.  Planned Discharge Destination: Home    Author: Marylu Lund, MD 04/18/2022 2:29 PM  For on call review [email or web address].

## 2022-04-19 DIAGNOSIS — J441 Chronic obstructive pulmonary disease with (acute) exacerbation: Secondary | ICD-10-CM | POA: Diagnosis not present

## 2022-04-19 DIAGNOSIS — J4541 Moderate persistent asthma with (acute) exacerbation: Secondary | ICD-10-CM | POA: Diagnosis not present

## 2022-04-19 DIAGNOSIS — J45901 Unspecified asthma with (acute) exacerbation: Secondary | ICD-10-CM | POA: Diagnosis not present

## 2022-04-19 LAB — GLUCOSE, CAPILLARY
Glucose-Capillary: 99 mg/dL (ref 70–99)
Glucose-Capillary: 122 mg/dL — ABNORMAL HIGH (ref 70–99)
Glucose-Capillary: 122 mg/dL — ABNORMAL HIGH (ref 70–99)
Glucose-Capillary: 142 mg/dL — ABNORMAL HIGH (ref 70–99)

## 2022-04-19 MED ORDER — IPRATROPIUM-ALBUTEROL 0.5-2.5 (3) MG/3ML IN SOLN
3.0000 mL | Freq: Three times a day (TID) | RESPIRATORY_TRACT | Status: DC
  Administered 2022-04-20 – 2022-04-21 (×5): 3 mL via RESPIRATORY_TRACT
  Filled 2022-04-19 (×6): qty 3

## 2022-04-19 NOTE — Progress Notes (Signed)
  Progress Note   Patient: Patty Howard QMK:[id number] DOB: [birth year] DOA: 04/17/2022     1 DOS: the patient was seen and examined on 04/19/2022   Brief hospital course: 66 y.o. female with medical history significant for asthma with COPD, OSA not currently on CPAP, hypertension, and type 2 diabetes mellitus who presents emergency department with shortness of breath, cough, and chest tightness.   Patient reports developing the symptoms on 04/11/2022.  She was seen in the emergency department the night of 04/15/2022 where she was treated with nebs, systemic steroids, antibiotics, and went home feeling improved.  She quickly began to worsen again, prompting her return  Assessment and Plan: 1. Acute exacerbation of COPD with asthma  - Culture sputum, start azithromycin -although pt was seen on room air this AM, still having coarse wheezing heard from several feet away -Would recommend continued scheduled nebs and IV steroids for now -Encourage ambulation as tolerated   2. Chest pain  - Serial troponin normal, d-dimer undetectable, and EKG non-ischemic  - Secondary to #1, continue treatment as above -    3. HTN  - Continue Norvasc, losartan-HCTZ     4. Type II DM  - A1c was 6.0% in May 2023  - Check CBGs and use low-intensity SSI       Subjective: Still complaining of sob and wheezing  Physical Exam: Vitals:   04/19/22 0821 04/19/22 0844 04/19/22 1315 04/19/22 1400  BP:    132/69  Pulse:    86  Resp:    16  Temp:    98.1 F (36.7 C)  TempSrc:    Oral  SpO2: 95% 94% 94% 96%  Weight:      Height:       General exam: Conversant, in no acute distress Respiratory system: normal chest rise, clear, no audible wheezing Cardiovascular system: regular rhythm, s1-s2 Gastrointestinal system: Nondistended, nontender, pos BS Central nervous system: No seizures, no tremors Extremities: No cyanosis, no joint deformities Skin: No rashes, no pallor Psychiatry: Affect normal // no  auditory hallucinations   Data Reviewed:  There are no new results to review at this time.  Family Communication:  Pt in room, family not at bedside  Disposition: Status is: Inpatient Continue inpatient stay because: Severity of illness  Planned Discharge Destination: Home    Author: Marylu Lund, MD 04/19/2022 3:42 PM  For on call review [email or web address].

## 2022-04-20 ENCOUNTER — Inpatient Hospital Stay (HOSPITAL_COMMUNITY): Payer: Medicare Other

## 2022-04-20 ENCOUNTER — Ambulatory Visit: Payer: Self-pay

## 2022-04-20 DIAGNOSIS — J441 Chronic obstructive pulmonary disease with (acute) exacerbation: Secondary | ICD-10-CM | POA: Diagnosis not present

## 2022-04-20 DIAGNOSIS — J45901 Unspecified asthma with (acute) exacerbation: Secondary | ICD-10-CM | POA: Diagnosis not present

## 2022-04-20 LAB — GLUCOSE, CAPILLARY
Glucose-Capillary: 116 mg/dL — ABNORMAL HIGH (ref 70–99)
Glucose-Capillary: 127 mg/dL — ABNORMAL HIGH (ref 70–99)
Glucose-Capillary: 119 mg/dL — ABNORMAL HIGH (ref 70–99)
Glucose-Capillary: 117 mg/dL — ABNORMAL HIGH (ref 70–99)

## 2022-04-20 LAB — RESPIRATORY PANEL BY PCR

## 2022-04-20 LAB — CULTURE, RESPIRATORY W GRAM STAIN: Culture: NORMAL

## 2022-04-20 MED ORDER — ACETYLCYSTEINE 20 % IN SOLN
4.0000 mL | Freq: Four times a day (QID) | RESPIRATORY_TRACT | Status: DC
  Administered 2022-04-20 – 2022-04-21 (×2): 4 mL via RESPIRATORY_TRACT
  Filled 2022-04-20 (×4): qty 4

## 2022-04-20 MED ORDER — GUAIFENESIN ER 600 MG PO TB12
1200.0000 mg | ORAL_TABLET | Freq: Two times a day (BID) | ORAL | Status: DC
  Administered 2022-04-20 – 2022-04-21 (×2): 1200 mg via ORAL
  Filled 2022-04-20 (×2): qty 2

## 2022-04-20 NOTE — Plan of Care (Signed)
  Problem: Education: Goal: Knowledge of disease or condition will improve Outcome: Progressing   Problem: Activity: Goal: Ability to tolerate increased activity will improve Outcome: Progressing   Problem: Respiratory: Goal: Ability to maintain a clear airway will improve Outcome: Progressing

## 2022-04-20 NOTE — Progress Notes (Signed)
  Patient Saturations on Room Air at Rest = 100%  Patient Saturations on Room Air while Ambulating = 99% - 100%

## 2022-04-20 NOTE — H&P (Signed)
NAME:  Ashland Osmer, MRN:  [id number], DOB:  March 20, 1956, LOS: 2 ADMISSION DATE:  04/17/2022, CONSULTATION DATE:  04/20/22 REFERRING MD:  Wyline Copas CHIEF COMPLAINT:  Dyspnea   History of Present Illness:  Dazia Lippold is a 66 y.o. female who has a PMH including but not limited to Asthma, COPD, OSA (not on CPAP due to malfunctioning device, was supposed to get it fixed in July but has not done so). She sees [place] in office, multiple providers but most recently saw Dr. Ander Slade Sept 2023.  She was admitted 11/3 with dyspnea, cough, wheezing. She had been seen in ED 2 days prior and was given nebs, steroids, abx. She felt improved so went home; however, symptoms then recurred prompting return to ED. She was admitted with working diagnosis of COPD exacerbation with overlapping asthma.  She was started on usual therapies including BD's, steroids, Azithromycin. On 11/6, she had persistent wheezing; therefore, PCCM asked to see in consultation.   At the time of our evaluation, she has ongoing wheezing mainly with forced expiration. She has ongoing cough and feels like she has phlegm in her chest; however, she is unable to cough it up. She has been able to ambulate around the room some but has to stop due to cough and dyspnea. CXR from 11/3 was non-remarkable. A repeat has been ordered now.   Pertinent  Medical History:  has 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); Insomnia; Elbow pain, chronic, left; Closed fracture of radial head; Left radial head fracture; H/O Spinal surgery; Essential hypertension; Obesity; Acute asthma exacerbation; Pancolitis ([place]); Dyspnea; Chest pain; Educated about COVID-19 virus infection; Aortic atherosclerosis ([place]); Abnormal findings on diagnostic imaging of lung; OSA (obstructive sleep apnea); Gastroesophageal reflux disease; Medication management; Menopause; Asthma exacerbation; Tracheobronchomalacia; Diastolic dysfunction; Trochanteric bursitis, left  hip; History of total hip arthroplasty, left; Other intervertebral disc degeneration, lumbar region; Acute exacerbation of COPD with asthma ([place]); COPD with acute exacerbation ([place]); Trigger finger, left middle finger; Type 2 diabetes mellitus without complication, without long-term current use of insulin ([place]); Cough; Right hip pain; and COPD exacerbation ([place]) on their problem list.  Significant Hospital Events: Including procedures, antibiotic start and stop dates in addition to other pertinent events   11/3 admit. 11/6 PCCM consult.  Interim History / Subjective:  Watching TV comfortably. OK at rest but does have worsening cough and wheeze with exertion.  Objective:  Blood pressure 104/76, pulse 87, temperature 97.9 F (36.6 C), temperature source Oral, resp. rate 18, height 5' 9"  (1.753 m), weight 127.3 kg, SpO2 97 %.        Intake/Output Summary (Last 24 hours) at 04/20/2022 1537 Last data filed at 04/20/2022 1006 Gross per 24 hour  Intake 480 ml  Output --  Net 480 ml   Filed Weights   04/17/22 2322  Weight: 127.3 kg    Examination: General: Adult female, resting in bed, in NAD. Neuro: A&O x 3, no deficits. HEENT: [place]/AT. Sclerae anicteric. EOMI. Cardiovascular: RRR, no M/R/G.  Lungs: Respirations even and unlabored.  Expiratory wheezes bilaterally. Abdomen: Obese. BS x 4, soft, NT/ND.  Musculoskeletal: No gross deformities, no edema.  Skin: Intact, warm, no rashes.  Assessment & Plan:   COPD with asthma overlap and acute exacerbation - unclear etiology at this point. - Continue Azithromycin, BD's, steroids. - Increase Guaifenesin. - Continue flutter valve. - Add Mucomyst nebs to help with clearing her chest congestion. - Add chest PT. - Mobilize as able. - Ambulatory desaturation study. - Get CXR  now as well as RVP for completeness. - Will arrange for outpatient hospital follow up in our office in the next 7 - 10 days.   Rest per primary team.  Best practice  (evaluated daily):  Per primary team.  Labs   CBC: Recent Labs  Lab 04/15/22 2214 04/15/22 2215 04/17/22 1247 04/18/22 0337  WBC  --  5.8 11.6* 9.8  NEUTROABS  --  3.7 9.1*  --   HGB 12.2 11.4* 11.2* 10.6*  HCT 36.0 35.8* 36.5 34.1*  MCV  --  81.9 85.3 85.5  PLT  --  298 326 416    Basic Metabolic Panel: Recent Labs  Lab 04/15/22 2214 04/15/22 2215 04/17/22 1247 04/18/22 0337  NA 140 141 143 141  K 3.6 3.5 3.8 4.5  CL  --  103 107 106  CO2  --  27 27 26   GLUCOSE  --  94 98 124*  BUN  --  14 24* 18  CREATININE  --  0.90 0.80 0.62  CALCIUM  --  9.7 9.7 9.4  MG  --   --   --  2.2   GFR: Estimated Creatinine Clearance: 96.3 mL/min (by C-G formula based on SCr of 0.62 mg/dL). Recent Labs  Lab 04/15/22 2215 04/17/22 1247 04/18/22 0337  WBC 5.8 11.6* 9.8    Liver Function Tests: No results for input(s): "AST", "ALT", "ALKPHOS", "BILITOT", "PROT", "ALBUMIN" in the last 168 hours. No results for input(s): "LIPASE", "AMYLASE" in the last 168 hours. No results for input(s): "AMMONIA" in the last 168 hours.  ABG    Component Value Date/Time   HCO3 30.4 (H) 04/15/2022 2214   TCO2 32 04/15/2022 2214   O2SAT 65 04/15/2022 2214     Coagulation Profile: No results for input(s): "INR", "PROTIME" in the last 168 hours.  Cardiac Enzymes: No results for input(s): "CKTOTAL", "CKMB", "CKMBINDEX", "TROPONINI" in the last 168 hours.  HbA1C: Hgb A1c MFr Bld  Date/Time Value Ref Range Status  10/21/2021 03:36 PM 6.0 (H) 4.8 - 5.6 % Final    Comment:             Prediabetes: 5.7 - 6.4          Diabetes: >6.4          Glycemic control for adults with diabetes: <7.0   11/26/2019 01:55 AM 6.7 (H) 4.8 - 5.6 % Final    Comment:    RESULTS CONFIRMED BY MANUAL DILUTION (NOTE) Pre diabetes:          5.7%-6.4%  Diabetes:              >6.4%  Glycemic control for   <7.0% adults with diabetes     CBG: Recent Labs  Lab 04/19/22 1130 04/19/22 1712 04/19/22 2319  04/20/22 0803 04/20/22 1201  GLUCAP 122* 122* 142* 116* 127*    Review of Systems:   All negative; except for those that are bolded, which indicate positives.  Constitutional: weight loss, weight gain, night sweats, fevers, chills, fatigue, weakness.  HEENT: headaches, sore throat, sneezing, nasal congestion, post nasal drip, difficulty swallowing, tooth/dental problems, visual complaints, visual changes, ear aches. Neuro: difficulty with speech, weakness, numbness, ataxia. CV:  chest pain, orthopnea, PND, swelling in lower extremities, dizziness, palpitations, syncope.  Resp: cough, hemoptysis, dyspnea, wheezing. GI: heartburn, indigestion, abdominal pain, nausea, vomiting, diarrhea, constipation, change in bowel habits, loss of appetite, hematemesis, melena, hematochezia.  GU: dysuria, change in color of urine, urgency or frequency, flank pain, hematuria. MSK:  joint pain or swelling, decreased range of motion. Psych: change in mood or affect, depression, anxiety, suicidal ideations, homicidal ideations. Skin: rash, itching, bruising.   Past Medical History:  She,  has a past medical history of Anxiety, Arthritis, Asthma, COPD (chronic obstructive pulmonary disease) ([place]), Fracture, GERD (gastroesophageal reflux disease), Hypertension, and Sleep apnea.   Surgical History:   Past Surgical History:  Procedure Laterality Date   BACK SURGERY     spinal stimulator   BREAST BIOPSY Right 2017   benign   COLONOSCOPY W/ BIOPSIES AND POLYPECTOMY     DILATION AND CURETTAGE OF UTERUS     LAPAROSCOPIC ROUX-EN-Y GASTRIC BYPASS WITH UPPER ENDOSCOPY AND REMOVAL OF LAP BAND     RADIAL HEAD ARTHROPLASTY Left 02/03/2019   Procedure: LEFT RADIAL HEAD ARTHROPLASTY;  Surgeon: [name], MD;  Location: [facility];  Service: Orthopedics;  Laterality: Left;  AXILLARY BLOCK VS BIER BLOCK   TOTAL HIP ARTHROPLASTY Left 01/22/2021   Procedure: LEFT TOTAL HIP ARTHROPLASTY ANTERIOR APPROACH;  Surgeon: [name], MD;  Location: [facility];  Service: Orthopedics;  Laterality: Left;   TUBAL LIGATION       Social History:   reports that she quit smoking about 23 years ago. Her smoking use included cigarettes. She has a 5.00 pack-year smoking history. She has never used smokeless tobacco. She reports that she does not drink alcohol and does not use drugs.   Family History:  Her family history includes Cancer in her father; Hypertension in her mother; Stroke in her mother.   Allergies No Known Allergies   Home Medications  Prior to Admission medications   Medication Sig Start Date End Date Taking? Authorizing Provider  albuterol (PROVENTIL) (2.5 MG/3ML) 0.083% nebulizer solution Take 3 mLs (2.5 mg total) by nebulization every 6 (six) hours as needed for wheezing or shortness of breath. 04/23/21  Yes Clemon Chambers, MD  albuterol (VENTOLIN HFA) 108 (90 Base) MCG/ACT inhaler Inhale 1-2 puffs into the lungs every 6 (six) hours as needed for wheezing or shortness of breath. 11/27/21  Yes Valentina Shaggy, MD  amLODipine (NORVASC) 5 MG tablet Take 1 tablet (5 mg total) by mouth daily. 10/21/21  Yes Duke, Tami Lin, PA  amoxicillin-clavulanate (AUGMENTIN) 875-125 MG tablet Take 1 tablet by mouth every 12 (twelve) hours. 04/16/22  Yes Wynona Dove A, DO  ascorbic acid (VITAMIN C) 500 MG tablet Take 500 mg by mouth daily.   Yes [provider]  atorvastatin (LIPITOR) 20 MG tablet Take 1 tablet (20 mg total) by mouth daily. 10/23/21  Yes Duke, Tami Lin, PA  azithromycin (ZITHROMAX) 250 MG tablet Take 1 tablet (250 mg total) by mouth daily. Take first 2 tablets together, then 1 every day until finished. 04/16/22  Yes Wynona Dove A, DO  benzonatate (TESSALON) 200 MG capsule Take 1 capsule (200 mg total) by mouth 3 (three) times daily as needed for cough. 01/01/22  Yes Parrett, Tammy S, NP  Dupilumab (DUPIXENT [place]) Inject 1 Syringe into the skin every 14 (fourteen) days.   Yes [provider]  Fluticasone-Umeclidin-Vilant (TRELEGY ELLIPTA) 200-62.5-25 MCG/ACT AEPB Inhale 1 puff daily Patient taking differently: Inhale 1 puff into the lungs daily. 12/05/21  Yes de [place], Raymond J, MD  furosemide (LASIX) 40 MG tablet Take 40 mg by mouth daily.   Yes [provider]  guaiFENesin (ROBITUSSIN) 100 MG/5ML liquid Take 5 mLs by mouth every 4 (four) hours as needed for cough or to loosen phlegm.  04/15/22  Yes Wynona Dove A, DO  ibuprofen (ADVIL) 800 MG tablet Take 1 tablet (800 mg total) by mouth every 8 (eight) hours as needed for moderate pain. 02/17/22  Yes [place], Raymond J, MD  ipratropium-albuterol (DUONEB) 0.5-2.5 (3) MG/3ML SOLN Take 3 mLs by nebulization every 6 (six) hours as needed. Patient taking differently: Take 3 mLs by nebulization every 6 (six) hours as needed (Wheezing, shortness of breath). 04/23/21  Yes Clemon Chambers, MD  levocetirizine (XYZAL) 5 MG tablet Take 1 tablet (5 mg total) by mouth every evening. 04/23/21  Yes Clemon Chambers, MD  levothyroxine (SYNTHROID) 100 MCG tablet Take 100 mcg by mouth daily before breakfast.   Yes [provider]  LINZESS 290 MCG CAPS capsule Take 290 mcg by mouth daily. 03/17/22  Yes [provider]  losartan-hydrochlorothiazide (HYZAAR) 100-25 MG tablet Take 1 tablet by mouth daily. 10/21/21  Yes Duke, Tami Lin, PA  Multiple Vitamin (MULTIVITAMIN WITH MINERALS) TABS tablet Take 1 tablet by mouth daily. Centrum Multivitamin   Yes [provider]  pantoprazole (PROTONIX) 40 MG tablet Take 1 tablet (40 mg total) by mouth daily. 11/27/21  Yes Valentina Shaggy, MD  potassium chloride SA (KLOR-CON M) 20 MEQ tablet Take 1 tablet (20 mEq total) by mouth daily. 09/30/21 04/17/22 Yes Ghimire, Dante Gang, MD  predniSONE (DELTASONE) 50 MG tablet Take 1 tablet (50 mg total) by mouth daily for 5 days. 04/16/22 04/21/22 Yes Jeanell Sparrow, DO  Semaglutide, 1 MG/DOSE, 4 MG/3ML SOPN Inject 1 mg as directed once a  week. Patient taking differently: Inject 1 mg into the skin once a week. 02/17/22  Yes de [place], Raymond J, MD  traMADol (ULTRAM) 50 MG tablet Take 1 tablet (50 mg total) by mouth every 12 (twelve) hours as needed. 02/27/22  Yes [name], MD  Vitamin D, Ergocalciferol, (DRISDOL) 1.25 MG (50000 UNIT) CAPS capsule Take 50,000 Units by mouth every Monday.  10/27/19  Yes [provider]  zafirlukast (ACCOLATE) 20 MG tablet TAKE 1 TABLET (20 MG TOTAL) BY MOUTH 2 (TWO) TIMES DAILY BEFORE A MEAL. Patient taking differently: Take 20 mg by mouth 2 (two) times daily. 02/20/22 04/17/22 Yes Valentina Shaggy, MD     Critical care time: N/A.   Montey Hora, [facility] For pager details, please see AMION or use Epic chat  After 1900, please call [facility] for cross coverage needs 04/20/2022, 3:37 PM

## 2022-04-20 NOTE — Progress Notes (Signed)
  Progress Note   Patient: Patty Howard TRR:[id number] DOB: 1955-12-27 DOA: 04/17/2022     2 DOS: the patient was seen and examined on 04/20/2022   Brief hospital course: 66 y.o. female with medical history significant for asthma with COPD, OSA not currently on CPAP, hypertension, and type 2 diabetes mellitus who presents emergency department with shortness of breath, cough, and chest tightness.   Patient reports developing the symptoms on 04/11/2022.  She was seen in the emergency department the night of 04/15/2022 where she was treated with nebs, systemic steroids, antibiotics, and went home feeling improved.  She quickly began to worsen again, prompting her return  Assessment and Plan: 1. Acute exacerbation of COPD with asthma  - Culture sputum, start azithromycin -remains with coarse breath sounds and audible wheezing from several feet away -Pt does report some improvement following neb tx -Would recommend continued scheduled nebs and IV steroids for now -Appreciate input by Pulmonary. Recs to continue azithro, BD, steroids, increase guaifenesin, also added mucomyst   2. Chest pain  - Serial troponin normal, d-dimer undetectable, and EKG non-ischemic  - Secondary to #1, continue treatment as above -    3. HTN  - Continue Norvasc, losartan-HCTZ     4. Type II DM  - A1c was 6.0% in May 2023  - Check CBGs and use low-intensity SSI       Subjective: Hoping to go home soon, however still having continued wheezing  Physical Exam: Vitals:   04/20/22 0617 04/20/22 0943 04/20/22 1424 04/20/22 1708  BP: (!) 111/98 138/86 104/76   Pulse: 86 96 87   Resp: 14 15 18    Temp: 98.7 F (37.1 C) 98.2 F (36.8 C) 97.9 F (36.6 C)   TempSrc:  Oral Oral   SpO2: 95% 96% 97% 100%  Weight:      Height:       General exam: Awake, laying in bed, in nad Respiratory system: Increased respiratory effort, audible wheezing heard several feet away Cardiovascular system: regular rate, s1,  s2 Gastrointestinal system: Soft, nondistended, positive BS Central nervous system: CN2-12 grossly intact, strength intact Extremities: Perfused, no clubbing Skin: Normal skin turgor, no notable skin lesions seen Psychiatry: Mood normal // no visual hallucinations    Data Reviewed:  There are no new results to review at this time.  Family Communication:  Pt in room, family not at bedside  Disposition: Status is: Inpatient Continue inpatient stay because: Severity of illness  Planned Discharge Destination: Home    Author: Marylu Lund, MD 04/20/2022 5:34 PM  For on call review [email or web address].

## 2022-04-21 ENCOUNTER — Inpatient Hospital Stay (HOSPITAL_COMMUNITY): Payer: Medicare Other

## 2022-04-21 ENCOUNTER — Telehealth: Payer: Self-pay | Admitting: Internal Medicine

## 2022-04-21 LAB — PHOSPHORUS: Phosphorus: 4 mg/dL (ref 2.5–4.6)

## 2022-04-21 LAB — GLUCOSE, CAPILLARY
Glucose-Capillary: 102 mg/dL — ABNORMAL HIGH (ref 70–99)
Glucose-Capillary: 132 mg/dL — ABNORMAL HIGH (ref 70–99)

## 2022-04-21 LAB — MAGNESIUM: Magnesium: 2 mg/dL (ref 1.7–2.4)

## 2022-04-21 LAB — PROCALCITONIN: Procalcitonin: <0.1 ng/mL

## 2022-04-21 MED ORDER — ACETYLCYSTEINE 20 % IN SOLN
4.0000 mL | Freq: Four times a day (QID) | RESPIRATORY_TRACT | Status: DC
  Administered 2022-04-21 (×2): 4 mL via RESPIRATORY_TRACT
  Filled 2022-04-21 (×3): qty 4

## 2022-04-21 MED ORDER — PREDNISONE 20 MG PO TABS: 40.0000 mg | ORAL_TABLET | Freq: Every day | ORAL | Status: DC

## 2022-04-21 MED ORDER — PREDNISONE 10 MG PO TABS: ORAL_TABLET | ORAL | 0 refills | Status: AC

## 2022-04-21 NOTE — Plan of Care (Signed)
  Problem: Education: Goal: Knowledge of the prescribed therapeutic regimen will improve Outcome: Progressing   Problem: Respiratory: Goal: Ability to maintain a clear airway will improve Outcome: Progressing   Problem: Education: Goal: Knowledge of General Education information will improve Description: Including pain rating scale, medication(s)/side effects and non-pharmacologic comfort measures Outcome: Progressing   Problem: Activity: Goal: Risk for activity intolerance will decrease Outcome: Progressing   Problem: Pain Managment: Goal: General experience of comfort will improve Outcome: Progressing   Problem: Safety: Goal: Ability to remain free from injury will improve Outcome: Progressing

## 2022-04-21 NOTE — Progress Notes (Signed)
NAME:  Patty Howard, MRN:  [id number], DOB:  1955-09-23, LOS: 3 ADMISSION DATE:  04/17/2022, CONSULTATION DATE:  04/20/22 REFERRING MD:  Wyline Copas CHIEF COMPLAINT:  Dyspnea   BRIEF  Patty Howard is a 66 y.o. female who has a PMH including but not limited to Asthma, COPD, OSA (not on CPAP due to malfunctioning device, was supposed to get it fixed in July but has not done so). She sees [place] in office, multiple providers but most recently saw Dr. Ander Slade Sept 2023.  She was admitted 11/3 with dyspnea, cough, wheezing. She had been seen in ED 2 days prior and was given nebs, steroids, abx. She felt improved so went home; however, symptoms then recurred prompting return to ED. She was admitted with working diagnosis of COPD exacerbation with overlapping asthma.  She was started on usual therapies including BD's, steroids, Azithromycin. On 11/6, she had persistent wheezing; therefore, PCCM asked to see in consultation.   At the time of our evaluation, she has ongoing wheezing mainly with forced expiration. She has ongoing cough and feels like she has phlegm in her chest; however, she is unable to cough it up. She has been able to ambulate around the room some but has to stop due to cough and dyspnea. CXR from 11/3 was non-remarkable. A repeat has been ordered now.   Pertinent  Medical History:  has 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); Insomnia; Elbow pain, chronic, left; Closed fracture of radial head; Left radial head fracture; H/O Spinal surgery; Essential hypertension; Obesity; Acute asthma exacerbation; Pancolitis ([place]); Dyspnea; Chest pain; Educated about COVID-19 virus infection; Aortic atherosclerosis ([place]); Abnormal findings on diagnostic imaging of lung; OSA (obstructive sleep apnea); Gastroesophageal reflux disease; Medication management; Menopause; Asthma exacerbation; Tracheobronchomalacia; Diastolic dysfunction; Trochanteric bursitis, left hip; History of total  hip arthroplasty, left; Other intervertebral disc degeneration, lumbar region; Acute exacerbation of COPD with asthma ([place]); COPD with acute exacerbation ([place]); Trigger finger, left middle finger; Type 2 diabetes mellitus without complication, without long-term current use of insulin ([place]); Cough; Right hip pain; and COPD exacerbation ([facility]) on their problem list.      Latest Ref Rng & Units 06/30/2021   10:56 AM  PFT Results  FVC-Pre L 1.95   FVC-Predicted Pre % 66   FVC-Post L 2.01   FVC-Predicted Post % 68   Pre FEV1/FVC % % 69   Post FEV1/FCV % % 71   FEV1-Pre L 1.34   FEV1-Predicted Pre % 58   FEV1-Post L 1.42   DLCO uncorrected ml/min/mmHg 24.99   DLCO UNC% % 111   DLCO corrected ml/min/mmHg 24.99   DLCO COR %Predicted % 111   DLVA Predicted % 142   TLC L 5.27   TLC % Predicted % 93   RV % Predicted % 117      Significant Hospital Events: Including procedures, antibiotic start and stop dates in addition to other pertinent events   11/3 admit. 11/6 PCCM consult.- Watching TV comfortably. OK at rest but does have worsening cough and wheeze with exertion. PFT with moderate obstruction. Nromal / Increaed DLCO  c/with asthma  -  reports that she quit smoking about 23 years ago. Her smoking use included cigarettes. She has a 5.00 pack-year smoking history. She has never used smokeless tobacco.  - June 2020 RAST allergy panel postive dust mite with normal IgE - - ON BIOLOGIC WITH ALLERGIEST  - echo gr1 ddx in may 2023. BNP normal this admit Interim History / Subjective:  11/7 - afebrfile. Normal wbc. 100% RA. Vital sstable. RVP neg. HRCT with mild tubular bronchiectasis in lower lobe. FEEELS GOOD  Objective:  Blood pressure (!) 148/74, pulse 78, temperature 98.7 F (37.1 C), resp. rate 18, height 5' 9"  (1.753 m), weight 127.3 kg, SpO2 100 %.    FiO2 (%):  [21 %] 21 %   Intake/Output Summary (Last 24 hours) at 04/21/2022 1509 Last data filed at 04/21/2022 1222 Gross per 24  hour  Intake 620 ml  Output --  Net 620 ml   Filed Weights   04/17/22 2322  Weight: 127.3 kg    General: No distress. Obese. No distress Neuro: Alert and Oriented x 3. GCS 15. Speech normal Psych: Pleasant Resp:  Barrel Chest - no.  Wheeze - MIld forced end expiratory wheeze+ , Crackles - no, No overt respiratory distress CVS: Normal heart sounds. Murmurs - no Ext: Stigmata of Connective Tissue Disease - no HEENT: Normal upper airway. PEERL +. No post nasal drip .  Assessment & Plan:   Allergiuc asthma overlap and acute exacerbation - unclear etiology at this point. Need to rule out vocal cord issues. RVP negative.  Plan  - dc soliumedrol - change to Please take Take prednisone 37m once daily x 3 days, then 387monce daily x 3 days, then 2066mnce daily x 3 days, then prednisone 71m90mce daily  x 3 days and stop - nebs - dc antibiotics  - NEEDS ENT Consult as opd - No future appointments. - have sent message to [email or web address] to give her apt - walking desat test in hospital  Ccm will sign off. Can go home from our perspective      SIGNATURE    Dr. MuraBrand MalesD., F.C.C.P,  Pulmonary and Critical Care Medicine Staff Physician, [facility]  [facility]  Medical Director - [facility] [place], [id number]: [phone] no answer  -> Check AMION or Try [phone] Telephone (clinical office): [phone] Telephone (research): [phone]  3:10 PM 04/21/2022    LABS    PULMONARY Recent Labs  Lab 04/15/22 2214  HCO3 30.4*  TCO2 32  O2SAT 65    CBC Recent Labs  Lab 04/15/22 2215 04/17/22 1247 04/18/22 0337  HGB 11.4* 11.2* 10.6*  HCT 35.8* 36.5 34.1*  WBC 5.8 11.6* 9.8  PLT 298 326 312    COAGULATION No results for input(s): "INR" in the last 168 hours.  CARDIAC  No results for input(s):  "TROPONINI" in the last 168 hours. No results for input(s): "PROBNP" in the last 168 hours.   CHEMISTRY Recent Labs  Lab 04/15/22 2214 04/15/22 2215 04/17/22 1247 04/18/22 0337 04/21/22 0400  NA 140 141 143 141  --   K 3.6 3.5 3.8 4.5  --   CL  --  103 107 106  --   CO2  --  27 27 26   --   GLUCOSE  --  94 98 124*  --   BUN  --  14 24* 18  --   CREATININE  --  0.90 0.80 0.62  --   CALCIUM  --  9.7 9.7 9.4  --   MG  --   --   --  2.2 2.0  PHOS  --   --   --   --  4.0  Estimated Creatinine Clearance: 96.3 mL/min (by C-G formula based on SCr of 0.62 mg/dL).   LIVER No results for input(s): "AST", "ALT", "ALKPHOS", "BILITOT", "PROT", "ALBUMIN", "INR" in the last 168 hours.   INFECTIOUS Recent Labs  Lab 04/21/22 0400  PROCALCITON <0.10     ENDOCRINE CBG (last 3)  Recent Labs    04/20/22 2156 04/21/22 0719 04/21/22 1151  GLUCAP 117* 102* 132*         IMAGING x48h  - image(s) personally visualized  -   highlighted in bold CT Chest High Resolution  Result Date: 04/21/2022 CLINICAL DATA:  COPD exacerbation, dyspnea on exertion EXAM: CT CHEST WITHOUT CONTRAST TECHNIQUE: Multidetector CT imaging of the chest was performed following the standard protocol without intravenous contrast. High resolution imaging of the lungs, as well as inspiratory and expiratory imaging, was performed. RADIATION DOSE REDUCTION: This exam was performed according to the departmental dose-optimization program which includes automated exposure control, adjustment of the mA and/or kV according to patient size and/or use of iterative reconstruction technique. COMPARISON:  09/27/2021, 11/11/2020 FINDINGS: Cardiovascular: Aortic atherosclerosis. Normal heart size. No pericardial effusion. Mediastinum/Nodes: No enlarged mediastinal, hilar, or axillary lymph nodes. Thyroid gland, trachea, and esophagus demonstrate no significant findings. Lungs/Pleura: No evidence of fibrotic interstitial lung disease.  Mild, bland appearing bandlike scarring of the lung bases, particularly in the right middle lobe (series 4, image 180) as well as in the posterior left apex (series 4, image 48). Mild tubular bronchiectasis in the lower lungs. Tracheobronchomalacia on expiratory phase imaging. Occasional tiny pulmonary nodules, generally most concentrated in the apices, for example a 0.3 cm nodule of the peripheral left apex (series 4, image 50), stable and benign. No pleural effusion or pneumothorax. Upper Abdomen: No acute abnormality.  Adjustable gastric lap band. Musculoskeletal: No chest wall abnormality. No acute osseous findings. IMPRESSION: 1. No evidence of fibrotic interstitial lung disease. 2. Mild, bland appearing bandlike scarring, consistent with sequelae of prior infection or inflammation. 3. Mild tubular bronchiectasis in the lower lungs. 4. Tracheobronchomalacia on expiratory phase imaging. No significant air trapping. 5. Multiple tiny pulmonary nodules, stable and benign, for which no further follow-up or characterization is required. Aortic Atherosclerosis (ICD10-I70.0). Electronically Signed   By: Delanna Ahmadi M.D.   On: 04/21/2022 12:59   DG CHEST PORT 1 VIEW  Result Date: 04/20/2022 CLINICAL DATA:  Wheezing, history of asthma EXAM: PORTABLE CHEST 1 VIEW COMPARISON:  04/17/2022 FINDINGS: Transverse diameter of heart is slightly increased. Evaluation of lower lung fields is limited due to apical lordotic projection. Cardiophrenic angles are indistinct, possibly due to prominent epicardial fat pads. There are no signs of pulmonary edema or focal pulmonary consolidation. There is no pleural effusion or pneumothorax. IMPRESSION: There are no signs of pulmonary edema or focal pulmonary consolidation. Electronically Signed   By: Elmer Picker M.D.   On: 04/20/2022 16:20

## 2022-04-21 NOTE — Progress Notes (Addendum)
Pt ambulated in the hallway on RA per order.   Resting HR - 100  // O2 sat - 99%  Ambulating HR - 112-115  // O2 sat - 96%  Lowest O2 sat during ambulation - 92%  // HR - 117  Immediate post ambulation HR - 120  //  O2 sat - 95%  3 mins post ambulation: HR - 104 // Sat 100%.  Pt had to take a few breaks to catch breath during ambulation but O2 sat did not drop below 92%.  Dr. Earlie Counts updated.

## 2022-04-21 NOTE — Plan of Care (Signed)
  Problem: Education: Goal: Knowledge of disease or condition will improve Outcome: Progressing   Problem: Activity: Goal: Ability to tolerate increased activity will improve Outcome: Progressing   Problem: Respiratory: Goal: Ability to maintain a clear airway will improve Outcome: Progressing

## 2022-04-21 NOTE — Discharge Summary (Signed)
Physician Discharge Summary   Patient: Patty Howard MRN: [id number] DOB: 1956-01-26  Admit date:     04/17/2022  Discharge date: 04/21/22  Discharge Physician: Marylu Lund   PCP: [place], Blondell Reveal, MD   Recommendations at discharge:    Follow up with PCP in 1-2 weeks Follow up with Pulmonary as scheduled Follow up with ENT as outpatient per Pulmonary recs  Discharge Diagnoses: Principal Problem:   Acute exacerbation of COPD with asthma ([place]) Active Problems:   Essential hypertension   Type 2 diabetes mellitus without complication, without long-term current use of insulin ([place])   COPD exacerbation ([place])  Resolved Problems:   * No resolved hospital problems. *  Hospital Course: 66 y.o. female with medical history significant for asthma with COPD, OSA not currently on CPAP, hypertension, and type 2 diabetes mellitus who presents emergency department with shortness of breath, cough, and chest tightness.   Patient reports developing the symptoms on 04/11/2022.  She was seen in the emergency department the night of 04/15/2022 where she was treated with nebs, systemic steroids, antibiotics, and went home feeling improved.  She quickly began to worsen again, prompting her return  Assessment and Plan: 1. Acute exacerbation of COPD with asthma  - Pt was given empiric azithromycin this visit -despite O2 sats remaining in the mid-90's, pt continued to have very coarse breath sounds, subjective sob and at times, even appeared to "tripod" -Pt did report some improvement following neb tx and scheduled steroids -Appreciate input by Pulmonary. Pt was continued on nebs and steroids as well as chest PT -by day of discharge, pt was much improved. Clear for d/c per Pulmonary with close outpatient f/u -Pt is recommended to f/u with ENT to rule out vocal cord issue. RVP neg -Pt to be discharged home on prednisone taper   2. Chest pain  - Serial troponin normal, d-dimer undetectable, and  EKG non-ischemic  - Secondary to #1, continue treatment as above -    3. HTN  - Continue Norvasc, losartan-HCTZ     4. Type II DM  - A1c was 6.0% in May 2023  - was continued on SSI coverage this visit       Consultants: Pulmonary Procedures performed:   Disposition: Home Diet recommendation:  Carb modified diet DISCHARGE MEDICATION: Allergies as of 04/21/2022   No Known Allergies      Medication List     STOP taking these medications    amoxicillin-clavulanate 875-125 MG tablet Commonly known as: AUGMENTIN   azithromycin 250 MG tablet Commonly known as: ZITHROMAX       TAKE these medications    albuterol (2.5 MG/3ML) 0.083% nebulizer solution Commonly known as: PROVENTIL Take 3 mLs (2.5 mg total) by nebulization every 6 (six) hours as needed for wheezing or shortness of breath.   albuterol 108 (90 Base) MCG/ACT inhaler Commonly known as: VENTOLIN HFA Inhale 1-2 puffs into the lungs every 6 (six) hours as needed for wheezing or shortness of breath.   amLODipine 5 MG tablet Commonly known as: NORVASC Take 1 tablet (5 mg total) by mouth daily.   ascorbic acid 500 MG tablet Commonly known as: VITAMIN C Take 500 mg by mouth daily.   atorvastatin 20 MG tablet Commonly known as: LIPITOR Take 1 tablet (20 mg total) by mouth daily.   benzonatate 200 MG capsule Commonly known as: TESSALON Take 1 capsule (200 mg total) by mouth 3 (three) times daily as needed for cough.   DUPIXENT [place] Inject 1  Syringe into the skin every 14 (fourteen) days.   furosemide 40 MG tablet Commonly known as: LASIX Take 40 mg by mouth daily.   guaiFENesin 100 MG/5ML liquid Commonly known as: ROBITUSSIN Take 5 mLs by mouth every 4 (four) hours as needed for cough or to loosen phlegm.   ibuprofen 800 MG tablet Commonly known as: ADVIL Take 1 tablet (800 mg total) by mouth every 8 (eight) hours as needed for moderate pain.   ipratropium-albuterol 0.5-2.5 (3) MG/3ML  Soln Commonly known as: DUONEB Take 3 mLs by nebulization every 6 (six) hours as needed. What changed: reasons to take this   levocetirizine 5 MG tablet Commonly known as: XYZAL Take 1 tablet (5 mg total) by mouth every evening.   levothyroxine 100 MCG tablet Commonly known as: SYNTHROID Take 100 mcg by mouth daily before breakfast.   Linzess 290 MCG Caps capsule Generic drug: linaclotide Take 290 mcg by mouth daily.   losartan-hydrochlorothiazide 100-25 MG tablet Commonly known as: HYZAAR Take 1 tablet by mouth daily.   multivitamin with minerals Tabs tablet Take 1 tablet by mouth daily. Centrum Multivitamin   pantoprazole 40 MG tablet Commonly known as: PROTONIX Take 1 tablet (40 mg total) by mouth daily.   potassium chloride SA 20 MEQ tablet Commonly known as: KLOR-CON M Take 1 tablet (20 mEq total) by mouth daily.   predniSONE 10 MG tablet Commonly known as: DELTASONE Take 4 tablets (40 mg total) by mouth daily with breakfast for 3 days, THEN 3 tablets (30 mg total) daily with breakfast for 3 days, THEN 2 tablets (20 mg total) daily with breakfast for 3 days, THEN 1 tablet (10 mg total) daily with breakfast for 3 days. Start taking on: April 21, 2022 What changed:  medication strength See the new instructions.   Semaglutide (1 MG/DOSE) 4 MG/3ML Sopn Inject 1 mg as directed once a week. What changed: how to take this   traMADol 50 MG tablet Commonly known as: ULTRAM Take 1 tablet (50 mg total) by mouth every 12 (twelve) hours as needed.   Trelegy Ellipta 200-62.5-25 MCG/ACT Aepb Generic drug: Fluticasone-Umeclidin-Vilant Inhale 1 puff daily What changed:  how much to take how to take this when to take this additional instructions   Vitamin D (Ergocalciferol) 1.25 MG (50000 UNIT) Caps capsule Commonly known as: DRISDOL Take 50,000 Units by mouth every Monday.   zafirlukast 20 MG tablet Commonly known as: ACCOLATE TAKE 1 TABLET (20 MG TOTAL) BY MOUTH  2 (TWO) TIMES DAILY BEFORE A MEAL. What changed: when to take this        Follow-up Information     [place], Blondell Reveal, MD Follow up in 2 week(s).   Specialty: Family Medicine Why: Hospital follow up Contact information: [place] [address] [phone]         [facility], [place]. Schedule an appointment as soon as possible for a visit.   Why: Hospital follow up Contact information: [place] [address] [phone]         Laurin Coder, MD Follow up.   Specialty: Pulmonary Disease Why: Hospital follow up, as scheduled Contact information: [place] [address] [place] [address] [phone]                Discharge Exam: Danley Danker Weights   04/17/22 2322  Weight: 127.3 kg   General exam: Awake, laying in bed, in nad Respiratory system: Normal respiratory  effort, no wheezing Cardiovascular system: regular rate, s1, s2 Gastrointestinal system: Soft, nondistended, positive BS Central nervous system: CN2-12 grossly intact, strength intact Extremities: Perfused, no clubbing Skin: Normal skin turgor, no notable skin lesions seen Psychiatry: Mood normal // no visual hallucinations   Condition at discharge: fair  The results of significant diagnostics from this hospitalization (including imaging, microbiology, ancillary and laboratory) are listed below for reference.   Imaging Studies: CT Chest High Resolution  Result Date: 04/21/2022 CLINICAL DATA:  COPD exacerbation, dyspnea on exertion EXAM: CT CHEST WITHOUT CONTRAST TECHNIQUE: Multidetector CT imaging of the chest was performed following the standard protocol without intravenous contrast. High resolution imaging of the lungs, as well as inspiratory and expiratory imaging, was performed. RADIATION DOSE REDUCTION: This exam was performed according to the departmental dose-optimization program which includes automated  exposure control, adjustment of the mA and/or kV according to patient size and/or use of iterative reconstruction technique. COMPARISON:  09/27/2021, 11/11/2020 FINDINGS: Cardiovascular: Aortic atherosclerosis. Normal heart size. No pericardial effusion. Mediastinum/Nodes: No enlarged mediastinal, hilar, or axillary lymph nodes. Thyroid gland, trachea, and esophagus demonstrate no significant findings. Lungs/Pleura: No evidence of fibrotic interstitial lung disease. Mild, bland appearing bandlike scarring of the lung bases, particularly in the right middle lobe (series 4, image 180) as well as in the posterior left apex (series 4, image 48). Mild tubular bronchiectasis in the lower lungs. Tracheobronchomalacia on expiratory phase imaging. Occasional tiny pulmonary nodules, generally most concentrated in the apices, for example a 0.3 cm nodule of the peripheral left apex (series 4, image 50), stable and benign. No pleural effusion or pneumothorax. Upper Abdomen: No acute abnormality.  Adjustable gastric lap band. Musculoskeletal: No chest wall abnormality. No acute osseous findings. IMPRESSION: 1. No evidence of fibrotic interstitial lung disease. 2. Mild, bland appearing bandlike scarring, consistent with sequelae of prior infection or inflammation. 3. Mild tubular bronchiectasis in the lower lungs. 4. Tracheobronchomalacia on expiratory phase imaging. No significant air trapping. 5. Multiple tiny pulmonary nodules, stable and benign, for which no further follow-up or characterization is required. Aortic Atherosclerosis (ICD10-I70.0). Electronically Signed   By: Delanna Ahmadi M.D.   On: 04/21/2022 12:59   DG CHEST PORT 1 VIEW  Result Date: 04/20/2022 CLINICAL DATA:  Wheezing, history of asthma EXAM: PORTABLE CHEST 1 VIEW COMPARISON:  04/17/2022 FINDINGS: Transverse diameter of heart is slightly increased. Evaluation of lower lung fields is limited due to apical lordotic projection. Cardiophrenic angles are  indistinct, possibly due to prominent epicardial fat pads. There are no signs of pulmonary edema or focal pulmonary consolidation. There is no pleural effusion or pneumothorax. IMPRESSION: There are no signs of pulmonary edema or focal pulmonary consolidation. Electronically Signed   By: Elmer Picker M.D.   On: 04/20/2022 16:20   DG Chest 2 View  Result Date: 04/17/2022 CLINICAL DATA:  Provided history: Shortness of breath. EXAM: CHEST - 2 VIEW COMPARISON:  Prior chest radiographs 04/15/2022 and earlier. FINDINGS: Heart size within normal limits. Aortic atherosclerosis. No appreciable airspace consolidation or pulmonary edema. No evidence of pleural effusion or pneumothorax. No acute bony abnormality identified. Prior gastric band procedure. IMPRESSION: No evidence of acute cardiopulmonary abnormality. Aortic Atherosclerosis (ICD10-I70.0). Electronically Signed   By: Kellie Simmering D.O.   On: 04/17/2022 13:32   DG Chest Portable 1 View  Result Date: 04/15/2022 CLINICAL DATA:  Shortness of breath EXAM: PORTABLE CHEST 1 VIEW COMPARISON:  Chest x-ray 10/27/2021 FINDINGS: There is minimal atelectasis/airspace disease in the right middle lobe. No pleural effusion or  pneumothorax. Cardiomediastinal silhouette is within normal limits. No acute fractures. IMPRESSION: Minimal atelectasis/airspace disease in the right middle lobe. Electronically Signed   By: Ronney Asters M.D.   On: 04/15/2022 21:53    Microbiology: Results for orders placed or performed during the hospital encounter of 04/17/22  Expectorated Sputum Assessment w Gram Stain, Rflx to Resp Cult     Status: None   Collection Time: 04/18/22  5:03 AM   Specimen: Sputum  Result Value Ref Range Status   Specimen Description SPUTUM  Final   Special Requests NONE  Final   Sputum evaluation   Final    THIS SPECIMEN IS ACCEPTABLE FOR SPUTUM CULTURE Performed at [facility], [place] [address], [place], [place] [address]     Report Status 04/18/2022 FINAL  Final  Culture, Respiratory w Gram Stain     Status: None   Collection Time: 04/18/22  5:03 AM   Specimen: SPU  Result Value Ref Range Status   Specimen Description   Final    SPUTUM Performed at [place] [address] [place], [place] [address]    Special Requests   Final    NONE Reflexed from [id number] Performed at [facility], [place] [address] [place], [place] [address]    Gram Stain   Final    ABUNDANT GRAM POSITIVE COCCI IN PAIRS IN CHAINS ABUNDANT GRAM NEGATIVE RODS FEW GRAM POSITIVE RODS RARE YEAST WITH PSEUDOHYPHAE FEW SQUAMOUS EPITHELIAL CELLS PRESENT MODERATE WBC PRESENT, PREDOMINANTLY PMN    Culture   Final    FEW Normal respiratory flora-no Staph aureus or Pseudomonas seen Performed at [facility], [place] [address] [place], [place] [address]    Report Status 04/20/2022 FINAL  Final  Respiratory (~20 pathogens) panel by PCR     Status: None   Collection Time: 04/20/22  5:08 PM   Specimen: Nasopharyngeal Swab; Respiratory  Result Value Ref Range Status   Adenovirus NOT DETECTED NOT DETECTED Final   Coronavirus 229E NOT DETECTED NOT DETECTED Final    Comment: (NOTE) The Coronavirus on the Respiratory Panel, DOES NOT test for the novel  Coronavirus (2019 nCoV)    Coronavirus HKU1 NOT DETECTED NOT DETECTED Final   Coronavirus NL63 NOT DETECTED NOT DETECTED Final   Coronavirus OC43 NOT DETECTED NOT DETECTED Final   Metapneumovirus NOT DETECTED NOT DETECTED Final   Rhinovirus / Enterovirus NOT DETECTED NOT DETECTED Final   Influenza A NOT DETECTED NOT DETECTED Final   Influenza B NOT DETECTED NOT DETECTED Final   Parainfluenza Virus 1 NOT DETECTED NOT DETECTED Final   Parainfluenza Virus 2 NOT DETECTED NOT DETECTED Final   Parainfluenza Virus 3 NOT DETECTED NOT DETECTED Final   Parainfluenza Virus 4 NOT DETECTED NOT DETECTED Final   Respiratory Syncytial Virus NOT DETECTED NOT DETECTED  Final   Bordetella pertussis NOT DETECTED NOT DETECTED Final   Bordetella Parapertussis NOT DETECTED NOT DETECTED Final   Chlamydophila pneumoniae NOT DETECTED NOT DETECTED Final   Mycoplasma pneumoniae NOT DETECTED NOT DETECTED Final    Comment: Performed at [facility], [place] [address], [place], [place] [address]    Labs: CBC: Recent Labs  Lab 04/15/22 2214 04/15/22 2215 04/17/22 1247 04/18/22 0337  WBC  --  5.8 11.6* 9.8  NEUTROABS  --  3.7 9.1*  --   HGB 12.2 11.4* 11.2* 10.6*  HCT 36.0 35.8* 36.5 34.1*  MCV  --  81.9 85.3 85.5  PLT  --  298 326 440   Basic Metabolic Panel: Recent Labs  Lab 04/15/22 2214 04/15/22 2215 04/17/22 1247 04/18/22 0337 04/21/22 0400  NA 140 141 143 141  --   K 3.6 3.5 3.8 4.5  --   CL  --  103 107 106  --   CO2  --  27 27 26   --   GLUCOSE  --  94 98 124*  --   BUN  --  14 24* 18  --   CREATININE  --  0.90 0.80 0.62  --   CALCIUM  --  9.7 9.7 9.4  --   MG  --   --   --  2.2 2.0  PHOS  --   --   --   --  4.0   Liver Function Tests: No results for input(s): "AST", "ALT", "ALKPHOS", "BILITOT", "PROT", "ALBUMIN" in the last 168 hours. CBG: Recent Labs  Lab 04/20/22 1201 04/20/22 1656 04/20/22 2156 04/21/22 0719 04/21/22 1151  GLUCAP 127* 119* 117* 102* 132*    Discharge time spent: less than 30 minutes.  Signed: Marylu Lund, MD [facility] 04/21/2022

## 2022-04-21 NOTE — Progress Notes (Signed)
Provided discharge education/instructions, all questions and concerns addressed. Pt not in acute distress and states she feels a lot better. Discharged home with belongings accompanied by son.

## 2022-04-21 NOTE — Telephone Encounter (Signed)
Fronmt desk  - need hospital follwupo with DR O or APP next 7 days

## 2022-04-21 NOTE — Care Management Important Message (Signed)
Important Message  Patient Details IM Letter given to the Patient. Name: Patty Howard MRN: [id number] Date of Birth: [birth year]   Medicare Important Message Given:  Yes     Kerin Salen 04/21/2022, 11:18 AM

## 2022-04-21 NOTE — Progress Notes (Signed)
  Transition of Care [facility]) Screening Note   Patient Details  Name: Patty Howard Date of Birth: September 08, 1955   Transition of Care [facility]) CM/SW Contact:    Lennart Pall, LCSW Phone Number: 04/21/2022, 8:51 AM    Transition of Care Department [facility]) has reviewed patient and no TOC needs have been identified at this time. We will continue to monitor patient advancement through interdisciplinary progression rounds. If new patient transition needs arise, please place a TOC consult.

## 2022-04-22 NOTE — Telephone Encounter (Signed)
Pt called office and has been scheduled for a HFU appt. Nothing further needed.

## 2022-04-23 ENCOUNTER — Other Ambulatory Visit (HOSPITAL_COMMUNITY): Payer: Self-pay

## 2022-04-23 ENCOUNTER — Encounter: Payer: Self-pay | Admitting: Adult Health

## 2022-04-23 ENCOUNTER — Ambulatory Visit (INDEPENDENT_AMBULATORY_CARE_PROVIDER_SITE_OTHER): Payer: Medicare Other | Admitting: Adult Health

## 2022-04-23 ENCOUNTER — Ambulatory Visit (INDEPENDENT_AMBULATORY_CARE_PROVIDER_SITE_OTHER): Payer: Medicare Other

## 2022-04-23 VITALS — BP 108/60 | HR 94 | Temp 98.4°F | Ht 69.0 in | Wt 277.6 lb

## 2022-04-23 DIAGNOSIS — J4489 Other specified chronic obstructive pulmonary disease: Secondary | ICD-10-CM | POA: Diagnosis not present

## 2022-04-23 DIAGNOSIS — G4733 Obstructive sleep apnea (adult) (pediatric): Secondary | ICD-10-CM

## 2022-04-23 DIAGNOSIS — J4550 Severe persistent asthma, uncomplicated: Secondary | ICD-10-CM

## 2022-04-23 DIAGNOSIS — J441 Chronic obstructive pulmonary disease with (acute) exacerbation: Secondary | ICD-10-CM

## 2022-04-23 DIAGNOSIS — K219 Gastro-esophageal reflux disease without esophagitis: Secondary | ICD-10-CM | POA: Diagnosis not present

## 2022-04-23 MED ORDER — BENZONATATE 200 MG PO CAPS: 200.0000 mg | ORAL_CAPSULE | Freq: Three times a day (TID) | ORAL | 2 refills | Status: DC | PRN

## 2022-04-23 MED ORDER — TRELEGY ELLIPTA 200-62.5-25 MCG/ACT IN AEPB: 1.0000 | INHALATION_SPRAY | Freq: Every day | RESPIRATORY_TRACT | 0 refills | Status: DC

## 2022-04-23 MED ORDER — TRELEGY ELLIPTA 200-62.5-25 MCG/ACT IN AEPB: 1.0000 | INHALATION_SPRAY | Freq: Every day | RESPIRATORY_TRACT | 11 refills | Status: DC

## 2022-04-23 MED ORDER — AMOXICILLIN-POT CLAVULANATE 875-125 MG PO TABS: 1.0000 | ORAL_TABLET | Freq: Two times a day (BID) | ORAL | 0 refills | Status: DC

## 2022-04-23 NOTE — Patient Instructions (Addendum)
Keep up follow up with ENT next month .  Augmentin 817m Twice daily  for 1 week, take with food.  Finish Prednisone taper . Restart TRELEGY 1 puff daily ,rinse after use Continue on Singulair and Xyzal daily  Continue on Dupixent .  Albuterol inhaler and neb As needed   Delsym 2 tsp Twice daily  for cough As needed   Tessalon Three times a day  for cough as needed  Home sleep study when available.  Order for new neb machine.  Follow up Dr. OAnder Sladeor Laneisha Mino NP  in 3-4  weeks and As needed   Please contact office for sooner follow up if symptoms do not improve or worsen or seek emergency care

## 2022-04-23 NOTE — Assessment & Plan Note (Signed)
Home sleep study is pending.  We will contact care coordinators to see timing for her repeat sleep study. Currently unable to use CPAP as her machine is broken.  Plan  Patient Instructions  Keep up follow up with ENT next month .  Augmentin 82m Twice daily  for 1 week, take with food.  Finish Prednisone taper . Restart TRELEGY 1 puff daily ,rinse after use Continue on Singulair and Xyzal daily  Continue on Dupixent .  Albuterol inhaler and neb As needed   Delsym 2 tsp Twice daily  for cough As needed   Tessalon Three times a day  for cough as needed  Home sleep study when available.  Order for new neb machine.  Follow up Dr. OAnder Sladeor Yousif Edelson NP  in 3-4  weeks and As needed   Please contact office for sooner follow up if symptoms do not improve or worsen or seek emergency care

## 2022-04-23 NOTE — Assessment & Plan Note (Signed)
Slow to resolve COPD exacerbation with recent hospitalization.  Patient is to finish up prednisone taper.  We will add in empiric antibiotics as she has ongoing cough with discolored mucus.  Recent CT chest showed no evidence of pneumonia.  Does show some bronchiectasis and tracheobronchomalacia.  Advised on trigger prevention with GERD and cough control.  Patient does have sleep apnea and is awaiting repeat sleep study so she can get her CPAP replacement which may also help her tracheobronchomalacia She does have a ENT appointment pending to evaluate for possible upper airway issues with vocal cord dysfunction   Plan  Patient Instructions  Keep up follow up with ENT next month .  Augmentin 834m Twice daily  for 1 week, take with food.  Finish Prednisone taper . Restart TRELEGY 1 puff daily ,rinse after use Continue on Singulair and Xyzal daily  Continue on Dupixent .  Albuterol inhaler and neb As needed   Delsym 2 tsp Twice daily  for cough As needed   Tessalon Three times a day  for cough as needed  Home sleep study when available.  Order for new neb machine.  Follow up Dr. OAnder Sladeor Karita Dralle NP  in 3-4  weeks and As needed   Please contact office for sooner follow up if symptoms do not improve or worsen or seek emergency care

## 2022-04-23 NOTE — Progress Notes (Signed)
@Patient  ID: Davis Gourd, female    DOB: Sep 04, 1955, 66 y.o.   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  Some mild postinfectious/inflammatory scarring in the lungs.   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  No hot tub or basement .  No unusual hobbies  Lives alone .  Drives independent.  No birds and chickens   04/23/2022 Follow up: COPD with asthma and OSA , Post hospital follow up  Patient presents for a posthospital follow-up.  Patient was admitted earlier this week for COPD exacerbation.  She was treated with IV antibiotics, steroids and nebulized bronchodilators.  She was discharged on a prednisone taper.  Since discharge patient is feeling only slightly better has been coughing up thick yellow-green mucus.  She says she remains tired and fatigued.  Says her cough is very congested.  She did run out of her Trelegy about 2 months ago.  She says she was doing well and did not feel like she needed to refill it.  She recently called a cold with increased cough and congestion.  That is when symptoms start to flare and got out of hand.  She remains on Dupixent and endorses compliance.  She is increased her albuterol use over the last 2 weeks.   She was set up for high-resolution CT chest April 21, 2022 during hospitalization that showed no evidence of interstitial lung disease.  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 She was recommended to see ENT for possible vocal cord dysfunction. Says her neb machine broke she needs a new nebulizer machine. She has been seen at [facility] for tracheobronchomalacia January 01, 2021 consult note was reviewed.  Was felt to have no apparent evidence of excessive central airway collapse.  Was recommended to manage underlying comorbidities with COPD, asthma and obesity.    Patient has underlying sleep apnea.  Unfortunately not been wearing her CPAP.  Because her CPAP broke. Home sleep study is pending . Can't get new CPAP without sleep study .   No Known Allergies  Immunization History  Administered Date(s) Administered   Fluad Quad(high Dose 65+) 03/12/2021   Influenza, High Dose Seasonal PF 03/06/2019, 03/13/2019   Influenza,inj,Quad PF,6+ Mos 04/07/2018   Influenza-Unspecified 03/29/2017, 03/05/2020, 02/25/2022   Moderna SARS-COV2 Booster Vaccination 05/03/2020   Moderna Sars-Covid-2 Vaccination 08/19/2019, 09/14/2019   Pneumococcal Polysaccharide-23 03/29/2017   Respiratory Syncytial Virus Vaccine,Recomb Aduvanted(Arexvy) 02/25/2022   Tdap 10/30/2017   Zoster Recombinat (Shingrix) 03/30/2019    Past Medical History:  Diagnosis Date   Anxiety    Arthritis    Asthma    COPD (chronic obstructive pulmonary disease) (HCC)    Fracture    closed displaced left radial head   GERD (gastroesophageal reflux disease)    Hypertension    Sleep apnea    does not wear CPAP    Tobacco History: Social History   Tobacco Use  Smoking Status Former  Packs/day: 0.50   Years: 10.00   Total pack years: 5.00   Types: Cigarettes   Quit date: 06/15/1998   Years since quitting: 23.8  Smokeless Tobacco Never   Counseling given: Not Answered   Outpatient Medications Prior to Visit  Medication Sig Dispense Refill   albuterol (PROVENTIL) (2.5 MG/3ML) 0.083% nebulizer solution Take 3 mLs (2.5 mg  total) by nebulization every 6 (six) hours as needed for wheezing or shortness of breath. 75 mL 1   albuterol (VENTOLIN HFA) 108 (90 Base) MCG/ACT inhaler Inhale 1-2 puffs into the lungs every 6 (six) hours as needed for wheezing or shortness of breath. 8 g 4   amLODipine (NORVASC) 5 MG tablet Take 1 tablet (5 mg total) by mouth daily. 90 tablet 3   ascorbic acid (VITAMIN C) 500 MG tablet Take 500 mg by mouth daily.     atorvastatin (LIPITOR) 20 MG tablet Take 1 tablet (20 mg total) by mouth daily. 90 tablet 3   Dupilumab (DUPIXENT [place]) Inject 1 Syringe into the skin every 14 (fourteen) days.     Fluticasone-Umeclidin-Vilant (TRELEGY ELLIPTA) 200-62.5-25 MCG/ACT AEPB Inhale 1 puff daily 28 each 5   furosemide (LASIX) 40 MG tablet Take 40 mg by mouth daily.     guaiFENesin (ROBITUSSIN) 100 MG/5ML liquid Take 5 mLs by mouth every 4 (four) hours as needed for cough or to loosen phlegm. 120 mL 0   ibuprofen (ADVIL) 800 MG tablet Take 1 tablet (800 mg total) by mouth every 8 (eight) hours as needed for moderate pain. 30 tablet 0   ipratropium-albuterol (DUONEB) 0.5-2.5 (3) MG/3ML SOLN Take 3 mLs by nebulization every 6 (six) hours as needed. (Patient taking differently: Take 3 mLs by nebulization every 6 (six) hours as needed (Wheezing, shortness of breath).) 150 mL 2   levocetirizine (XYZAL) 5 MG tablet Take 1 tablet (5 mg total) by mouth every evening. 90 tablet 1   levothyroxine (SYNTHROID) 100 MCG tablet Take 100 mcg by mouth daily before breakfast.     LINZESS 290 MCG CAPS capsule Take 290 mcg by mouth daily.     losartan-hydrochlorothiazide (HYZAAR) 100-25 MG tablet Take 1 tablet by mouth daily. 90 tablet 3   Multiple Vitamin (MULTIVITAMIN WITH MINERALS) TABS tablet Take 1 tablet by mouth daily. Centrum Multivitamin     pantoprazole (PROTONIX) 40 MG tablet Take 1 tablet (40 mg total) by mouth daily. 90 tablet 1   predniSONE (DELTASONE) 10 MG tablet Take 4 tablets (40 mg total) by mouth daily with  breakfast for 3 days, THEN 3 tablets (30 mg total) daily with breakfast for 3 days, THEN 2 tablets (20 mg total) daily with breakfast for 3 days, THEN 1 tablet (10 mg total) daily with breakfast for 3 days. 30 tablet 0   Semaglutide, 1 MG/DOSE, 4 MG/3ML SOPN Inject 1 mg as directed once a week. (Patient taking differently: Inject 1 mg into the skin once a week.) 3 mL 2   traMADol (ULTRAM) 50 MG tablet Take 1 tablet (50 mg total) by mouth every 12 (twelve) hours as needed. 30 tablet 0   Vitamin D, Ergocalciferol, (DRISDOL) 1.25 MG (50000 UNIT) CAPS capsule Take 50,000 Units by mouth every Monday.      benzonatate (TESSALON) 200 MG capsule Take 1 capsule (200 mg total) by mouth 3 (three) times daily as needed for cough. 30 capsule 2   potassium chloride SA (KLOR-CON M) 20 MEQ tablet Take 1 tablet (20 mEq total) by mouth daily. 30 tablet  0   zafirlukast (ACCOLATE) 20 MG tablet TAKE 1 TABLET (20 MG TOTAL) BY MOUTH 2 (TWO) TIMES DAILY BEFORE A MEAL. (Patient taking differently: Take 20 mg by mouth 2 (two) times daily.) 60 tablet 3   Facility-Administered Medications Prior to Visit  Medication Dose Route Frequency Provider Last Rate Last Admin   Benralizumab SOSY 30 mg  30 mg Subcutaneous Q8 Toney Reil, MD   30 mg at 11/27/21 1529   dupilumab (DUPIXENT) prefilled syringe 300 mg  300 mg Subcutaneous Q14 Days Valentina Shaggy, MD   300 mg at 04/23/22 1048     Review of Systems:   Constitutional:   No  weight loss, night sweats,  Fevers, chills, + fatigue, or  lassitude.  HEENT:   No headaches,  Difficulty swallowing,  Tooth/dental problems, or  Sore throat,                No sneezing, itching, ear ache, nasal congestion, post nasal drip,   CV:  No chest pain,  Orthopnea, PND, swelling in lower extremities, anasarca, dizziness, palpitations, syncope.   GI  No heartburn, indigestion, abdominal pain, nausea, vomiting, diarrhea, change in bowel habits, loss of appetite, bloody  stools.   Resp: .  No chest wall deformity  Skin: no rash or lesions.  GU: no dysuria, change in color of urine, no urgency or frequency.  No flank pain, no hematuria   MS:  No joint pain or swelling.  No decreased range of motion.  No back pain.    Physical Exam  BP 108/60 (BP Location: Left Arm, Patient Position: Sitting, Cuff Size: Large)   Pulse 94   Temp 98.4 F (36.9 C) (Oral)   Ht 5' 9"  (1.753 m)   Wt 277 lb 9.6 oz (125.9 kg)   BMI 40.99 kg/m   GEN: A/Ox3; pleasant , NAD, well nourished    HEENT:  /AT,   NOSE-clear, THROAT-clear, no lesions, no postnasal drip or exudate noted.   NECK:  Supple w/ fair ROM; no JVD; normal carotid impulses w/o bruits; no thyromegaly or nodules palpated; no lymphadenopathy.    RESP coarse rhonchi bilaterally no accessory muscle use, no dullness to percussion  CARD:  RRR, no m/r/g, no peripheral edema, pulses intact, no cyanosis or clubbing.  GI:   Soft & nt; nml bowel sounds; no organomegaly or masses detected.   Musco: Warm bil, no deformities or joint swelling noted.   Neuro: alert, no focal deficits noted.    Skin: Warm, no lesions or rashes    Lab Results:      ProBNP No results found for: "PROBNP"  Imaging: CT Chest High Resolution  Result Date: 04/21/2022 CLINICAL DATA:  COPD exacerbation, dyspnea on exertion EXAM: CT CHEST WITHOUT CONTRAST TECHNIQUE: Multidetector CT imaging of the chest was performed following the standard protocol without intravenous contrast. High resolution imaging of the lungs, as well as inspiratory and expiratory imaging, was performed. RADIATION DOSE REDUCTION: This exam was performed according to the departmental dose-optimization program which includes automated exposure control, adjustment of the mA and/or kV according to patient size and/or use of iterative reconstruction technique. COMPARISON:  09/27/2021, 11/11/2020 FINDINGS: Cardiovascular: Aortic atherosclerosis. Normal heart size. No  pericardial effusion. Mediastinum/Nodes: No enlarged mediastinal, hilar, or axillary lymph nodes. Thyroid gland, trachea, and esophagus demonstrate no significant findings. Lungs/Pleura: No evidence of fibrotic interstitial lung disease. Mild, bland appearing bandlike scarring of the lung bases, particularly in the right middle lobe (series 4, image 180)  as well as in the posterior left apex (series 4, image 48). Mild tubular bronchiectasis in the lower lungs. Tracheobronchomalacia on expiratory phase imaging. Occasional tiny pulmonary nodules, generally most concentrated in the apices, for example a 0.3 cm nodule of the peripheral left apex (series 4, image 50), stable and benign. No pleural effusion or pneumothorax. Upper Abdomen: No acute abnormality.  Adjustable gastric lap band. Musculoskeletal: No chest wall abnormality. No acute osseous findings. IMPRESSION: 1. No evidence of fibrotic interstitial lung disease. 2. Mild, bland appearing bandlike scarring, consistent with sequelae of prior infection or inflammation. 3. Mild tubular bronchiectasis in the lower lungs. 4. Tracheobronchomalacia on expiratory phase imaging. No significant air trapping. 5. Multiple tiny pulmonary nodules, stable and benign, for which no further follow-up or characterization is required. Aortic Atherosclerosis (ICD10-I70.0). Electronically Signed   By: Delanna Ahmadi M.D.   On: 04/21/2022 12:59   DG CHEST PORT 1 VIEW  Result Date: 04/20/2022 CLINICAL DATA:  Wheezing, history of asthma EXAM: PORTABLE CHEST 1 VIEW COMPARISON:  04/17/2022 FINDINGS: Transverse diameter of heart is slightly increased. Evaluation of lower lung fields is limited due to apical lordotic projection. Cardiophrenic angles are indistinct, possibly due to prominent epicardial fat pads. There are no signs of pulmonary edema or focal pulmonary consolidation. There is no pleural effusion or pneumothorax. IMPRESSION: There are no signs of pulmonary edema or focal  pulmonary consolidation. Electronically Signed   By: Elmer Picker M.D.   On: 04/20/2022 16:20   DG Chest 2 View  Result Date: 04/17/2022 CLINICAL DATA:  Provided history: Shortness of breath. EXAM: CHEST - 2 VIEW COMPARISON:  Prior chest radiographs 04/15/2022 and earlier. FINDINGS: Heart size within normal limits. Aortic atherosclerosis. No appreciable airspace consolidation or pulmonary edema. No evidence of pleural effusion or pneumothorax. No acute bony abnormality identified. Prior gastric band procedure. IMPRESSION: No evidence of acute cardiopulmonary abnormality. Aortic Atherosclerosis (ICD10-I70.0). Electronically Signed   By: Kellie Simmering D.O.   On: 04/17/2022 13:32   DG Chest Portable 1 View  Result Date: 04/15/2022 CLINICAL DATA:  Shortness of breath EXAM: PORTABLE CHEST 1 VIEW COMPARISON:  Chest x-ray 10/27/2021 FINDINGS: There is minimal atelectasis/airspace disease in the right middle lobe. No pleural effusion or pneumothorax. Cardiomediastinal silhouette is within normal limits. No acute fractures. IMPRESSION: Minimal atelectasis/airspace disease in the right middle lobe. Electronically Signed   By: Ronney Asters M.D.   On: 04/15/2022 21:53    dupilumab (DUPIXENT) prefilled syringe 300 mg     Date Action Dose Route User   04/23/2022 1048 Given 300 mg Subcutaneous (Right Arm) Julius Bowels, CMA   Discharged on 04/21/2022   Admitted on 04/17/2022   Discharged on 04/16/2022   Admitted on 04/15/2022   03/18/2022 1052 Given 300 mg Subcutaneous (Left Arm) Isabel Caprice, CMA   03/04/2022 1122 Given 300 mg Subcutaneous (Right Arm) Derl Barrow, CMA          Latest Ref Rng & Units 06/30/2021   10:56 AM  PFT Results  FVC-Pre L 1.95   FVC-Predicted Pre % 66   FVC-Post L 2.01   FVC-Predicted Post % 68   Pre FEV1/FVC % % 69   Post FEV1/FCV % % 71   FEV1-Pre L 1.34   FEV1-Predicted Pre % 58   FEV1-Post L 1.42   DLCO uncorrected ml/min/mmHg 24.99   DLCO  [facility]% % 111   DLCO corrected ml/min/mmHg 24.99   DLCO COR %Predicted % 111   DLVA Predicted % 142  TLC L 5.27   TLC % Predicted % 93   RV % Predicted % 117     No results found for: "NITRICOXIDE"      Assessment & Plan:   COPD exacerbation (HCC) Slow to resolve COPD exacerbation with recent hospitalization.  Patient is to finish up prednisone taper.  We will add in empiric antibiotics as she has ongoing cough with discolored mucus.  Recent CT chest showed no evidence of pneumonia.  Does show some bronchiectasis and tracheobronchomalacia.  Advised on trigger prevention with GERD and cough control.  Patient does have sleep apnea and is awaiting repeat sleep study so she can get her CPAP replacement which may also help her tracheobronchomalacia She does have a ENT appointment pending to evaluate for possible upper airway issues with vocal cord dysfunction   Plan  Patient Instructions  Keep up follow up with ENT next month .  Augmentin 879m Twice daily  for 1 week, take with food.  Finish Prednisone taper . Restart TRELEGY 1 puff daily ,rinse after use Continue on Singulair and Xyzal daily  Continue on Dupixent .  Albuterol inhaler and neb As needed   Delsym 2 tsp Twice daily  for cough As needed   Tessalon Three times a day  for cough as needed  Home sleep study when available.  Order for new neb machine.  Follow up Dr. OAnder Sladeor Seniya Stoffers NP  in 3-4  weeks and As needed   Please contact office for sooner follow up if symptoms do not improve or worsen or seek emergency care      OSA (obstructive sleep apnea) Home sleep study is pending.  We will contact care coordinators to see timing for her repeat sleep study. Currently unable to use CPAP as her machine is broken.  Plan  Patient Instructions  Keep up follow up with ENT next month .  Augmentin 8791mTwice daily  for 1 week, take with food.  Finish Prednisone taper . Restart TRELEGY 1 puff daily ,rinse after use Continue  on Singulair and Xyzal daily  Continue on Dupixent .  Albuterol inhaler and neb As needed   Delsym 2 tsp Twice daily  for cough As needed   Tessalon Three times a day  for cough as needed  Home sleep study when available.  Order for new neb machine.  Follow up Dr. OlAnder Slader Ashkan Chamberland NP  in 3-4  weeks and As needed   Please contact office for sooner follow up if symptoms do not improve or worsen or seek emergency care      Gastroesophageal reflux disease Continue on current regimen along with GERD diet     TaRexene EdisonNP 04/23/2022

## 2022-04-23 NOTE — Assessment & Plan Note (Signed)
Continue on current regimen along with GERD diet

## 2022-04-29 ENCOUNTER — Encounter: Payer: Self-pay | Admitting: Pulmonary Disease

## 2022-04-29 ENCOUNTER — Ambulatory Visit (INDEPENDENT_AMBULATORY_CARE_PROVIDER_SITE_OTHER): Payer: Medicare Other | Admitting: Pulmonary Disease

## 2022-04-29 VITALS — BP 124/68 | HR 88 | Temp 98.8°F | Ht 69.0 in | Wt 277.6 lb

## 2022-04-29 DIAGNOSIS — R0602 Shortness of breath: Secondary | ICD-10-CM | POA: Diagnosis not present

## 2022-04-29 MED ORDER — ZOLPIDEM TARTRATE ER 6.25 MG PO TBCR: 6.2500 mg | EXTENDED_RELEASE_TABLET | Freq: Every evening | ORAL | 0 refills | Status: DC | PRN

## 2022-04-29 MED ORDER — GUAIFENESIN-CODEINE 100-10 MG/5ML PO SOLN: 5.0000 mL | Freq: Three times a day (TID) | ORAL | 0 refills | Status: DC | PRN

## 2022-04-29 NOTE — Patient Instructions (Signed)
Your CT scan is not showing evidence of scarring as we reviewed  There is a small nodule in the lung that have stayed stable over the last year   The coughing and congestion is related to a recent bronchitis that you are still on antibiotics and steroids for Nothing needs to be changed at present  I did call you in a prescription for cough medicine with codeine and it, should help the coughing  Continue using the flutter device  I did call you a prescription for Ambien to help you sleep at night  An over-the-counter medication-N-acetylcysteine-NAC-can be used to help mucus clearance  There is no evidence of scarring on your CAT scan  Follow-up appointment in about 4 weeks  Call us with significant concerns

## 2022-04-29 NOTE — Progress Notes (Signed)
Is anybody waiting   Subjective:    Patient ID: Patty Howard, female    DOB: 11/30/1955, 66 y.o.   MRN: [id number]  She is in for follow-up today  She was recently seen in the office for a acute bronchitis for which she was prescribed a course of steroids and antibiotics  She was recently in the hospital as well for similar symptoms  She is still coughing, bringing up minimal sputum She has some chest discomfort  She is concerned that she may cough and pass out  She has not been sleeping well since onset of symptoms  She does have a history of obstructive sleep apnea, she is awaiting a repeat home sleep study  Has had no fevers or chills recently She is on guaifenesin but does not seem to be helping symptoms She also continues to use a flutter valve on a regular basis  Background history of asthma/COPD, history of hypertension -She is on Dupixent for asthma  Previous CPAP machine became dysfunctional   Past Medical History:  Diagnosis Date   Anxiety    Arthritis    Asthma    COPD (chronic obstructive pulmonary disease) ([place])    Fracture    closed displaced left radial head   GERD (gastroesophageal reflux disease)    Hypertension    Sleep apnea    does not wear CPAP   Social History   Socioeconomic History   Marital status: Widowed    Spouse name: Not on file   Number of children: Not on file   Years of education: Not on file   Highest education level: Not on file  Occupational History   Not on file  Tobacco Use   Smoking status: Former    Packs/day: 0.50    Years: 10.00    Total pack years: 5.00    Types: Cigarettes    Quit date: 06/15/1998    Years since quitting: 23.8   Smokeless tobacco: Never  Vaping Use   Vaping Use: Never used  Substance and Sexual Activity   Alcohol use: Never   Drug use: Never   Sexual activity: Yes  Other Topics Concern   Not on file  Social History Narrative   Three children.  Engineer, structural in [place].     Social  Determinants of Health   Financial Resource Strain: Not on file  Food Insecurity: Not on file  Transportation Needs: Not on file  Physical Activity: Not on file  Stress: Not on file  Social Connections: Not on file  Intimate Partner Violence: Not on file   Family History  Problem Relation Age of Onset   Hypertension Mother    Stroke Mother    Cancer Father     Review of Systems  Constitutional:  Negative for fever and unexpected weight change.  HENT:  Positive for congestion. Negative for dental problem, ear pain, nosebleeds, postnasal drip, rhinorrhea, sinus pressure, sneezing, sore throat and trouble swallowing.   Eyes:  Negative for redness and itching.  Respiratory:  Positive for cough, shortness of breath and wheezing. Negative for chest tightness.   Cardiovascular:  Negative for palpitations and leg swelling.  Gastrointestinal:  Negative for nausea and vomiting.  Genitourinary:  Negative for dysuria.  Musculoskeletal:  Negative for joint swelling.  Skin:  Negative for rash.  Allergic/Immunologic: Positive for environmental allergies and food allergies. Negative for immunocompromised state.  Neurological:  Negative for headaches.  Hematological:  Does not bruise/bleed easily.  Psychiatric/Behavioral:  Negative for dysphoric mood. The  patient is nervous/anxious.        Objective:   Physical Exam Constitutional:      Appearance: She is obese.  HENT:     Head: Normocephalic and atraumatic.     Mouth/Throat:     Mouth: Mucous membranes are moist.  Eyes:     Pupils: Pupils are equal, round, and reactive to light.  Cardiovascular:     Rate and Rhythm: Normal rate.     Pulses: Normal pulses.     Heart sounds: Normal heart sounds. No murmur heard.    No friction rub.  Pulmonary:     Effort: Pulmonary effort is normal. No respiratory distress.     Breath sounds: No stridor. Rhonchi present. No wheezing.  Musculoskeletal:     Cervical back: No rigidity or tenderness.   Neurological:     General: No focal deficit present.     Mental Status: She is alert.  Psychiatric:        Mood and Affect: Mood normal.   Vitals:   04/29/22 0856  BP: 124/68  Pulse: 88  Temp: 98.8 F (37.1 C)  SpO2: 98%      04/29/2022    8:00 AM 04/05/2019    2:00 PM  Results of the Epworth flowsheet  Sitting and reading 3 3  Watching TV 2 3  Sitting, inactive in a public place (e.g. a theatre or a meeting) 2 3  As a passenger in a car for an hour without a break 2 3  Lying down to rest in the afternoon when circumstances permit 2 3  Sitting and talking to someone 0 3  Sitting quietly after a lunch without alcohol 0 3  In a car, while stopped for a few minutes in traffic 0 3  Total score 11 24   CT scan of the chest was reviewed with the patient -CT scan was performed 04/21/2022 This was compared with the previous CT 09/27/2021 and also CT 11/26/2019 -CAT scans do not reveal any evidence of interstitial lung disease, there is some mild bronchiectasis, small lung nodule    Assessment & Plan:   Acute bronchitis -Protracted symptoms- -recently on Augmentin and prednisone which she still continues  History of obstructive sleep apnea that was severe -Machine became dysfunctional -She is awaiting a new home sleep study  Patient is concerned about scarring in her lungs was addressed today -I did review the CT scans with her and made comparisons with previous CTs -None of the CTs show evidence of scarring  History of tracheobronchomalacia -She was [facility] at [facility] 01, 2021 -Note reviewed  Class II obesity -Encouraged to continue working on weight loss efforts   Plan: .  Follow-up on home sleep study .  Continue Dupixent .  Continue nebulizer use .  Prescription for cough medicine .  Flutter device use .  Prescription for Ambien to help insomnia .  N-acetylcysteine may help mucus clearance  . Reassured about the absence of scarring on recent  CTs  Follow-up appointment about 4 weeks

## 2022-04-30 ENCOUNTER — Telehealth: Payer: Self-pay | Admitting: Pulmonary Disease

## 2022-04-30 NOTE — Telephone Encounter (Signed)
Called patient and she states she missed a call in regards to scheduling her home sleep study . I advised patient that I would message the [facility] regarding getting this scheduled.   Please advsie

## 2022-04-30 NOTE — Telephone Encounter (Signed)
Patient stated she is returning a call to schedule appt. For her sleep study.  Please call patient back to schedule appt. At [phone]

## 2022-04-30 NOTE — Telephone Encounter (Signed)
Called pt back and had to leave her another vm.

## 2022-05-06 ENCOUNTER — Encounter: Payer: Self-pay | Admitting: Nurse Practitioner

## 2022-05-11 ENCOUNTER — Encounter: Payer: Self-pay | Admitting: Pulmonary Disease

## 2022-05-11 NOTE — Telephone Encounter (Signed)
Left pt another vm to schedule hst.  Mailed letter.  Will close phone message.

## 2022-05-12 ENCOUNTER — Ambulatory Visit (INDEPENDENT_AMBULATORY_CARE_PROVIDER_SITE_OTHER): Payer: Medicare Other | Admitting: *Deleted

## 2022-05-12 DIAGNOSIS — J4550 Severe persistent asthma, uncomplicated: Secondary | ICD-10-CM | POA: Diagnosis not present

## 2022-05-17 NOTE — Progress Notes (Deleted)
Cardiology Clinic Note   Patient Name: Patty Howard Date of Encounter: 05/17/2022  Primary Care Provider:  Pcp, No Primary Cardiologist:  Minus Breeding, MD  Patient Profile    Patty Howard 65 year old female presents the clinic today for follow-up evaluation of her DOE and Chronic HF with preserved EF.  Past Medical History    Past Medical History:  Diagnosis Date   Anxiety    Arthritis    Asthma    COPD (chronic obstructive pulmonary disease) ([place])    Fracture    closed displaced left radial head   GERD (gastroesophageal reflux disease)    Hypertension    Sleep apnea    does not wear CPAP   Past Surgical History:  Procedure Laterality Date   BACK SURGERY     spinal stimulator   BREAST BIOPSY Right 2017   benign   COLONOSCOPY W/ BIOPSIES AND POLYPECTOMY     DILATION AND CURETTAGE OF UTERUS     LAPAROSCOPIC ROUX-EN-Y GASTRIC BYPASS WITH UPPER ENDOSCOPY AND REMOVAL OF LAP BAND     RADIAL HEAD ARTHROPLASTY Left 02/03/2019   Procedure: LEFT RADIAL HEAD ARTHROPLASTY;  Surgeon: [name], MD;  Location: [facility];  Service: Orthopedics;  Laterality: Left;  AXILLARY BLOCK VS BIER BLOCK   TOTAL HIP ARTHROPLASTY Left 01/22/2021   Procedure: LEFT TOTAL HIP ARTHROPLASTY ANTERIOR APPROACH;  Surgeon: [name], MD;  Location: [facility];  Service: Orthopedics;  Laterality: Left;   TUBAL LIGATION      Allergies  No Known Allergies  History of Present Illness    Patty Howard is a PMH of OSA on CPAP, COPD, asthma, and  HFpEF.  She used to be a Engineer, structural in [place].  She establish care with Dr. Percival Spanish on 12/01/2019.  She was being seen for shortness of breath and chest discomfort.  She had been hospitalized for 2 days prior to her clinic visit with chest discomfort and shortness of breath.  She was treated for HFpEF.  Her echocardiogram 6/21 showed an LVEF of 65-70%, G1 DD, no regional wall motion abnormalities and no significant valvular disease.   She had nuclear stress testing which was negative for ischemia.  She was noted to have aortic atherosclerosis on CTA 2021.  She was also noted to have pulmonary nodules that were identified on CT which are followed by pulmonology.  She was hospitalized 4/23 with COPD exacerbation secondary to influenza A infection.  She was treated with Tamiflu and doxycycline.  She was discharged on prednisone taper.  She was seen in follow-up by cardiology 12/21.  She has a history of nonadherence with her medications.  During that time she continued to take her furosemide as needed.  She presented for follow-up on 10/21/2021 with Angie Duke PA-C.  She was out of her medications at that time.  She noted dyspnea on exertion which was felt to be in part related to her asthma.  She continues to follow with pulmonology and allergy.  She had not been going to the gym for the previous 6 weeks due to dyspnea with exertion.  Her dry weight was noted to be 281 pounds.  She was following with a weight management program.  Her echocardiogram was repeated and showed normal LVEF, moderate concentric LVH, G1 DD, mild dilation of her left atria and no significant valvular abnormalities.  Her medications were refilled and follow-up was planned for around 6 months.  She presents to the clinic today for follow-up evaluation  states***  *** denies chest pain, shortness of breath, lower extremity edema, fatigue, palpitations, melena, hematuria, hemoptysis, diaphoresis, weakness, presyncope, syncope, orthopnea, and PND.   HFpEF-euvolemic.  Has been going back to baseline physical activity.  Follow-up echocardiogram showed normal LVEF and G1 DD without changes. Continue furosemide, losartan, HCTZ, potassium Heart healthy low-sodium diet-salty 6 given Increase physical activity as tolerated  DOE-stable.  Continues to follow with pulmonology Continue current medical therapy  Hyperlipidemia-LDL*** Continue atorvastatin Heart healthy  low-sodium diet-salty 6 given Increase physical activity as tolerated Repeat fasting lipids and LFTs  Essential hypertension-BP today*** Continue amlodipine Heart healthy low-sodium diet-salty 6 given Increase physical activity as tolerated   Type 2 diabetes-glucose 124 on 04/18/2022. Carb modified diet Increase physical activity as tolerated Follows with PCP  Disposition: Follow-up with Dr. Percival Spanish or me in 4 to 6 months.  Home Medications    Prior to Admission medications   Medication Sig Start Date End Date Taking? Authorizing Provider  albuterol (PROVENTIL) (2.5 MG/3ML) 0.083% nebulizer solution Take 3 mLs (2.5 mg total) by nebulization every 6 (six) hours as needed for wheezing or shortness of breath. 04/23/21   Clemon Chambers, MD  albuterol (VENTOLIN HFA) 108 (90 Base) MCG/ACT inhaler Inhale 1-2 puffs into the lungs every 6 (six) hours as needed for wheezing or shortness of breath. 11/27/21   Valentina Shaggy, MD  amLODipine (NORVASC) 5 MG tablet Take 1 tablet (5 mg total) by mouth daily. 10/21/21   Duke, Tami Lin, PA  amoxicillin-clavulanate (AUGMENTIN) 875-125 MG tablet Take 1 tablet by mouth 2 (two) times daily. 04/23/22   Parrett, Fonnie Mu, NP  ascorbic acid (VITAMIN C) 500 MG tablet Take 500 mg by mouth daily.    [provider]  atorvastatin (LIPITOR) 20 MG tablet Take 1 tablet (20 mg total) by mouth daily. 10/23/21   Duke, Tami Lin, PA  benzonatate (TESSALON) 200 MG capsule Take 1 capsule (200 mg total) by mouth 3 (three) times daily as needed for cough. 04/23/22   Parrett, Fonnie Mu, NP  Dupilumab (DUPIXENT [place]) Inject 1 Syringe into the skin every 14 (fourteen) days.    [provider]  Fluticasone-Umeclidin-Vilant Viviana Simpler ELLIPTA) 200-62.5-25 MCG/ACT AEPB Inhale 1 puff daily Patient not taking: Reported on 04/29/2022 12/05/21   de [place], Blondell Reveal, MD  Fluticasone-Umeclidin-Vilant (TRELEGY ELLIPTA) 200-62.5-25 MCG/ACT AEPB Inhale 1 puff into the  lungs daily at 6 (six) AM. 04/23/22   Parrett, Fonnie Mu, NP  Fluticasone-Umeclidin-Vilant (TRELEGY ELLIPTA) 200-62.5-25 MCG/ACT AEPB Inhale 1 puff into the lungs daily at 6 (six) AM. Patient not taking: Reported on 04/29/2022 04/23/22   Parrett, Fonnie Mu, NP  furosemide (LASIX) 40 MG tablet Take 40 mg by mouth daily.    [provider]  guaiFENesin-codeine 100-10 MG/5ML syrup Take 5 mLs by mouth 3 (three) times daily as needed for cough. 04/29/22   Laurin Coder, MD  ibuprofen (ADVIL) 800 MG tablet Take 1 tablet (800 mg total) by mouth every 8 (eight) hours as needed for moderate pain. 02/17/22   de [place], Blondell Reveal, MD  ipratropium-albuterol (DUONEB) 0.5-2.5 (3) MG/3ML SOLN Take 3 mLs by nebulization every 6 (six) hours as needed. Patient taking differently: Take 3 mLs by nebulization every 6 (six) hours as needed (Wheezing, shortness of breath). 04/23/21   Clemon Chambers, MD  levocetirizine (XYZAL) 5 MG tablet Take 1 tablet (5 mg total) by mouth every evening. 04/23/21   Clemon Chambers, MD  levothyroxine (SYNTHROID) 100 MCG tablet Take 100  mcg by mouth daily before breakfast.    [provider]  LINZESS 290 MCG CAPS capsule Take 290 mcg by mouth daily. 03/17/22   [provider]  losartan-hydrochlorothiazide (HYZAAR) 100-25 MG tablet Take 1 tablet by mouth daily. 10/21/21   Duke, Tami Lin, PA  Multiple Vitamin (MULTIVITAMIN WITH MINERALS) TABS tablet Take 1 tablet by mouth daily. Centrum Multivitamin    [provider]  pantoprazole (PROTONIX) 40 MG tablet Take 1 tablet (40 mg total) by mouth daily. 11/27/21   Valentina Shaggy, MD  potassium chloride SA (KLOR-CON M) 20 MEQ tablet Take 1 tablet (20 mEq total) by mouth daily. 09/30/21 04/17/22  Barb Merino, MD  Semaglutide, 1 MG/DOSE, 4 MG/3ML SOPN Inject 1 mg as directed once a week. Patient taking differently: Inject 1 mg into the skin once a week. 02/17/22   de [place], Blondell Reveal, MD  traMADol (ULTRAM) 50 MG  tablet Take 1 tablet (50 mg total) by mouth every 12 (twelve) hours as needed. 02/27/22   [name], MD  Vitamin D, Ergocalciferol, (DRISDOL) 1.25 MG (50000 UNIT) CAPS capsule Take 50,000 Units by mouth every Monday.  10/27/19   [provider]  zafirlukast (ACCOLATE) 20 MG tablet TAKE 1 TABLET (20 MG TOTAL) BY MOUTH 2 (TWO) TIMES DAILY BEFORE A MEAL. Patient taking differently: Take 20 mg by mouth 2 (two) times daily. 02/20/22 04/17/22  Valentina Shaggy, MD  zolpidem (AMBIEN CR) 6.25 MG CR tablet Take 1 tablet (6.25 mg total) by mouth at bedtime as needed for sleep. 04/29/22   Laurin Coder, MD    Family History    Family History  Problem Relation Age of Onset   Hypertension Mother    Stroke Mother    Cancer Father    She indicated that her mother is deceased. She indicated that her father is deceased.  Social History    Social History   Socioeconomic History   Marital status: Widowed    Spouse name: Not on file   Number of children: Not on file   Years of education: Not on file   Highest education level: Not on file  Occupational History   Not on file  Tobacco Use   Smoking status: Former    Packs/day: 0.50    Years: 10.00    Total pack years: 5.00    Types: Cigarettes    Quit date: 06/15/1998    Years since quitting: 23.9   Smokeless tobacco: Never  Vaping Use   Vaping Use: Never used  Substance and Sexual Activity   Alcohol use: Never   Drug use: Never   Sexual activity: Yes  Other Topics Concern   Not on file  Social History Narrative   Three children.  Engineer, structural in [place].     Social Determinants of Health   Financial Resource Strain: Not on file  Food Insecurity: Not on file  Transportation Needs: Not on file  Physical Activity: Not on file  Stress: Not on file  Social Connections: Not on file  Intimate Partner Violence: Not on file     Review of Systems    General:  No chills, fever, night sweats or weight changes.   Cardiovascular:  No chest pain, dyspnea on exertion, edema, orthopnea, palpitations, paroxysmal nocturnal dyspnea. Dermatological: No rash, lesions/masses Respiratory: No cough, dyspnea Urologic: No hematuria, dysuria Abdominal:   No nausea, vomiting, diarrhea, bright red blood per rectum, melena, or hematemesis Neurologic:  No visual changes, wkns, changes in  mental status. All other systems reviewed and are otherwise negative except as noted above.  Physical Exam    VS:  There were no vitals taken for this visit. , BMI There is no height or weight on file to calculate BMI. GEN: Well nourished, well developed, in no acute distress. HEENT: normal. Neck: Supple, no JVD, carotid bruits, or masses. Cardiac: RRR, no murmurs, rubs, or gallops. No clubbing, cyanosis, edema.  Radials/DP/PT 2+ and equal bilaterally.  Respiratory:  Respirations regular and unlabored, clear to auscultation bilaterally. GI: Soft, nontender, nondistended, BS + x 4. MS: no deformity or atrophy. Skin: warm and dry, no rash. Neuro:  Strength and sensation are intact. Psych: Normal affect.  Accessory Clinical Findings    Recent Labs: 10/21/2021: ALT 24 04/15/2022: B Natriuretic Peptide 36.8 04/18/2022: BUN 18; Creatinine, Ser 0.62; Hemoglobin 10.6; Platelets 312; Potassium 4.5; Sodium 141 04/21/2022: Magnesium 2.0   Recent Lipid Panel    Component Value Date/Time   CHOL 197 10/21/2021 1536   TRIG 56 10/21/2021 1536   HDL 77 10/21/2021 1536   CHOLHDL 2.6 10/21/2021 1536   LDLCALC 110 (H) 10/21/2021 1536   LDLDIRECT 107 (H) 10/21/2021 1536    No BP recorded.  {Refresh Note OR Click here to enter BP  :1}***    ECG personally reviewed by me today- *** - No acute changes  Nuclear stress test 12/20/2019 The left ventricular ejection fraction is hyperdynamic (>65%). Nuclear stress EF: 65%. There was no ST segment deviation noted during stress. No T wave inversion was noted during stress. The study is  normal. This is a low risk study.   1. Slightly reduced counts in the apex on rest and stress with normal wall motion consistent with apical thinning artifact.  2. Normal myocardial perfusion imaging study without ischemia or infarction.  3. TID ratio is upper limits of normal for pharmacological stress. Visually, there is no TID present. Suspect artifactual due to small LV cavity.  4. Normal LVEF, 65%.  5. This is a low-risk study.      Echocardiogram 11/11/2021  IMPRESSIONS     1. Left ventricular ejection fraction, by estimation, is 60 to 65%. The  left ventricle has normal function. The left ventricle has no regional  wall motion abnormalities. There is moderate concentric left ventricular  hypertrophy. Left ventricular  diastolic parameters are consistent with Grade I diastolic dysfunction  (impaired relaxation).   2. Right ventricular systolic function is normal. The right ventricular  size is normal. There is normal pulmonary artery systolic pressure.   3. Left atrial size was mildly dilated.   4. The mitral valve is normal in structure. No evidence of mitral valve  regurgitation. No evidence of mitral stenosis.   5. The aortic valve is tricuspid. Aortic valve regurgitation is not  visualized. No aortic stenosis is present.   6. The inferior vena cava is normal in size with greater than 50%  respiratory variability, suggesting right atrial pressure of 3 mmHg.   FINDINGS   Left Ventricle: Left ventricular ejection fraction, by estimation, is 60  to 65%. The left ventricle has normal function. The left ventricle has no  regional wall motion abnormalities. The left ventricular internal cavity  size was normal in size. There is   moderate concentric left ventricular hypertrophy. Left ventricular  diastolic parameters are consistent with Grade I diastolic dysfunction  (impaired relaxation). Indeterminate filling pressures.   Right Ventricle: The right ventricular size is  normal. No increase in  right ventricular  wall thickness. Right ventricular systolic function is  normal. There is normal pulmonary artery systolic pressure. The tricuspid  regurgitant velocity is 1.89 m/s, and   with an assumed right atrial pressure of 3 mmHg, the estimated right  ventricular systolic pressure is 18.5 mmHg.   Left Atrium: Left atrial size was mildly dilated.   Right Atrium: Right atrial size was normal in size.   Pericardium: There is no evidence of pericardial effusion.   Mitral Valve: The mitral valve is normal in structure. No evidence of  mitral valve regurgitation. No evidence of mitral valve stenosis.   Tricuspid Valve: The tricuspid valve is normal in structure. Tricuspid  valve regurgitation is trivial. No evidence of tricuspid stenosis.   Aortic Valve: The aortic valve is tricuspid. Aortic valve regurgitation is  not visualized. No aortic stenosis is present.   Pulmonic Valve: The pulmonic valve was normal in structure. Pulmonic valve  regurgitation is not visualized. No evidence of pulmonic stenosis.   Aorta: The aortic root is normal in size and structure.   Venous: The inferior vena cava is normal in size with greater than 50%  respiratory variability, suggesting right atrial pressure of 3 mmHg.   IAS/Shunts: No atrial level shunt detected by color flow Doppler.  Assessment & Plan   1.  ***   Jossie Ng. Elonda Giuliano NP-C     05/17/2022, 4:22 PM [facility] [address] Office [phone] Fax [phone]    I spent***minutes examining this patient, reviewing medications, and using patient centered shared decision making involving her cardiac care.  Prior to her visit I spent greater than 20 minutes reviewing her past medical history,  medications, and prior cardiac tests.

## 2022-05-20 ENCOUNTER — Ambulatory Visit: Payer: PRIVATE HEALTH INSURANCE | Admitting: General Practice

## 2022-05-26 ENCOUNTER — Ambulatory Visit (INDEPENDENT_AMBULATORY_CARE_PROVIDER_SITE_OTHER): Payer: Medicare Other

## 2022-05-26 DIAGNOSIS — J4550 Severe persistent asthma, uncomplicated: Secondary | ICD-10-CM | POA: Diagnosis not present

## 2022-06-02 IMAGING — CR DG CHEST 2V
2 series · 2 of 2 positions shown · non-contrast
Comparison: 10/27/2019

CLINICAL DATA: Chest pain, shortness of breath

EXAM:
CHEST - 2 VIEW

[w chest pa]
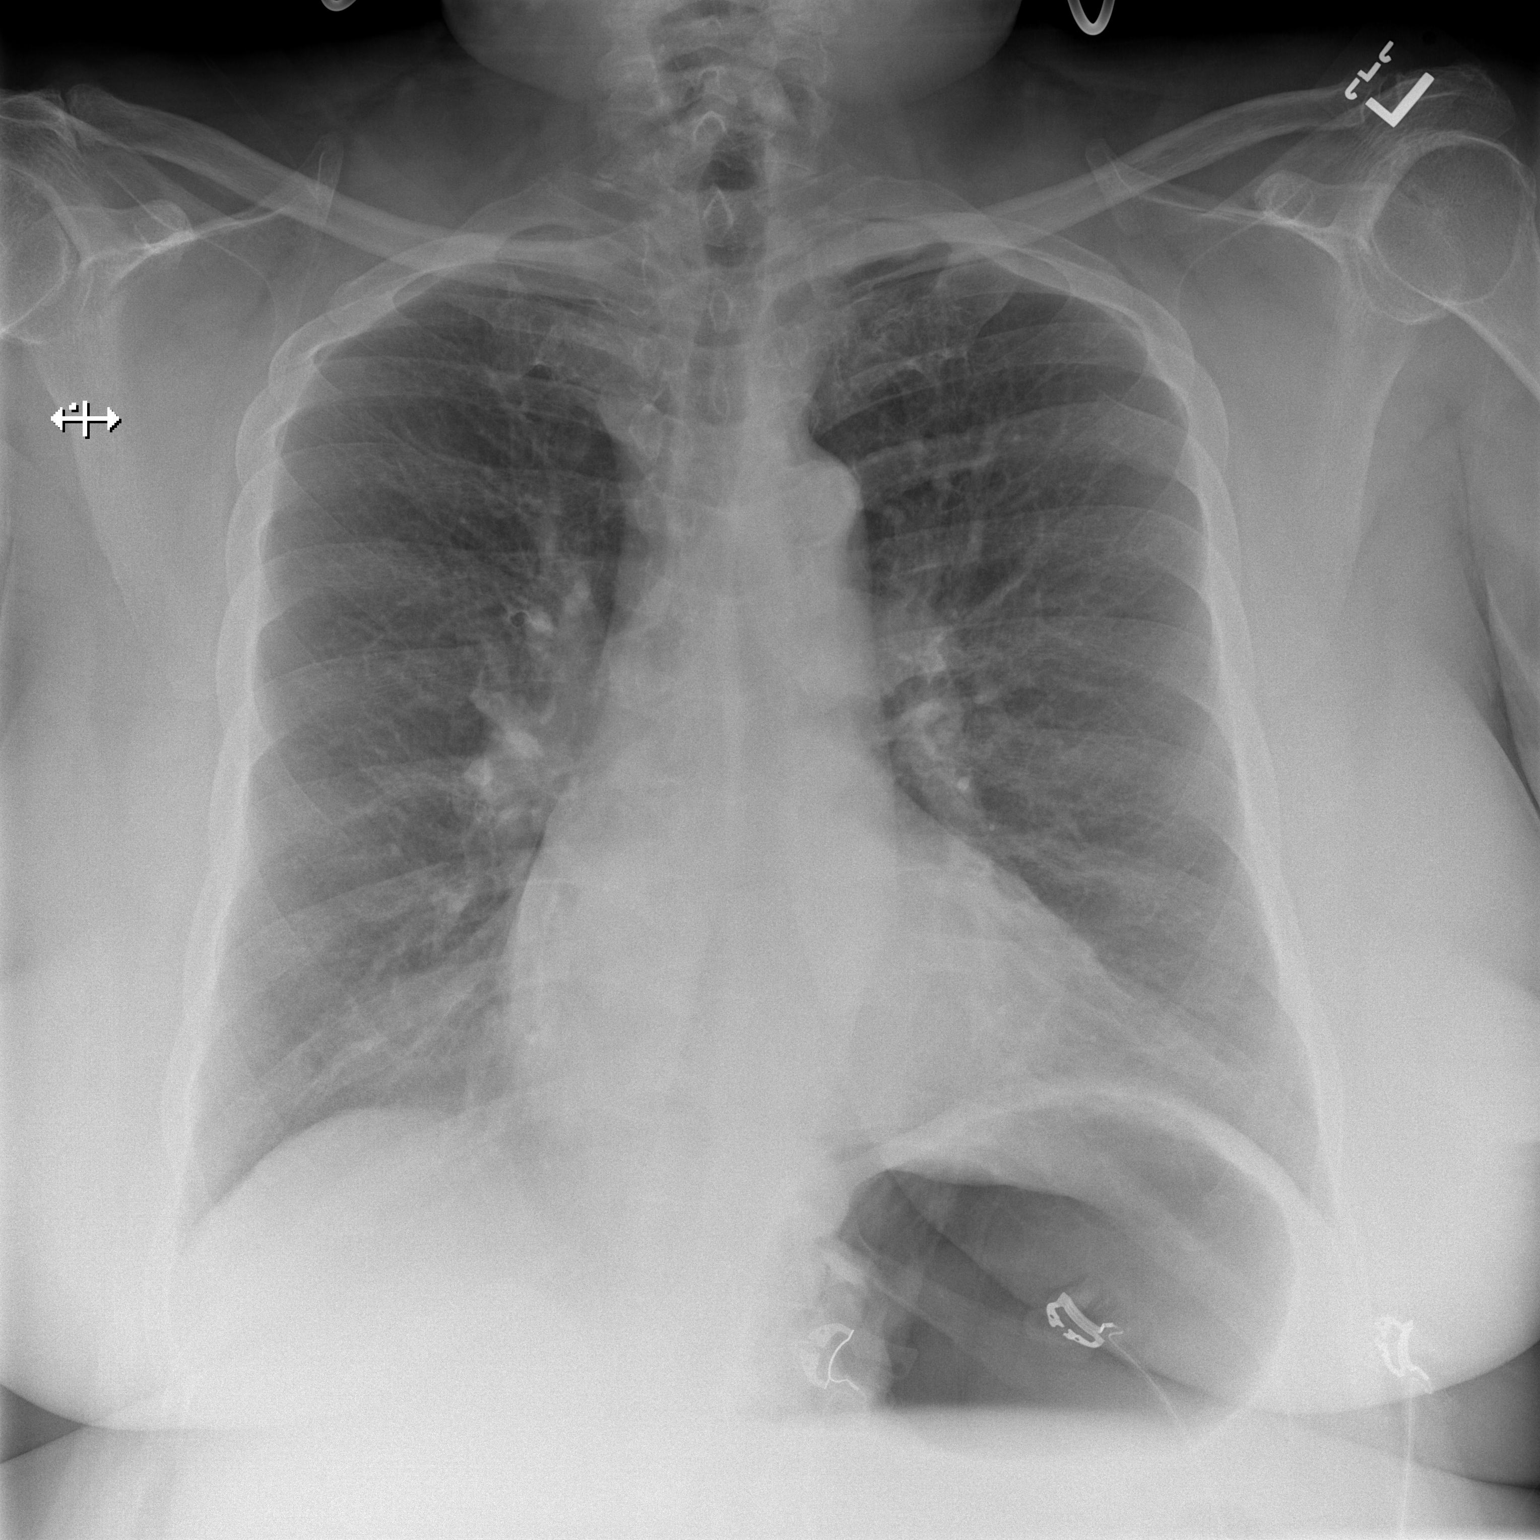

[w chest lat]
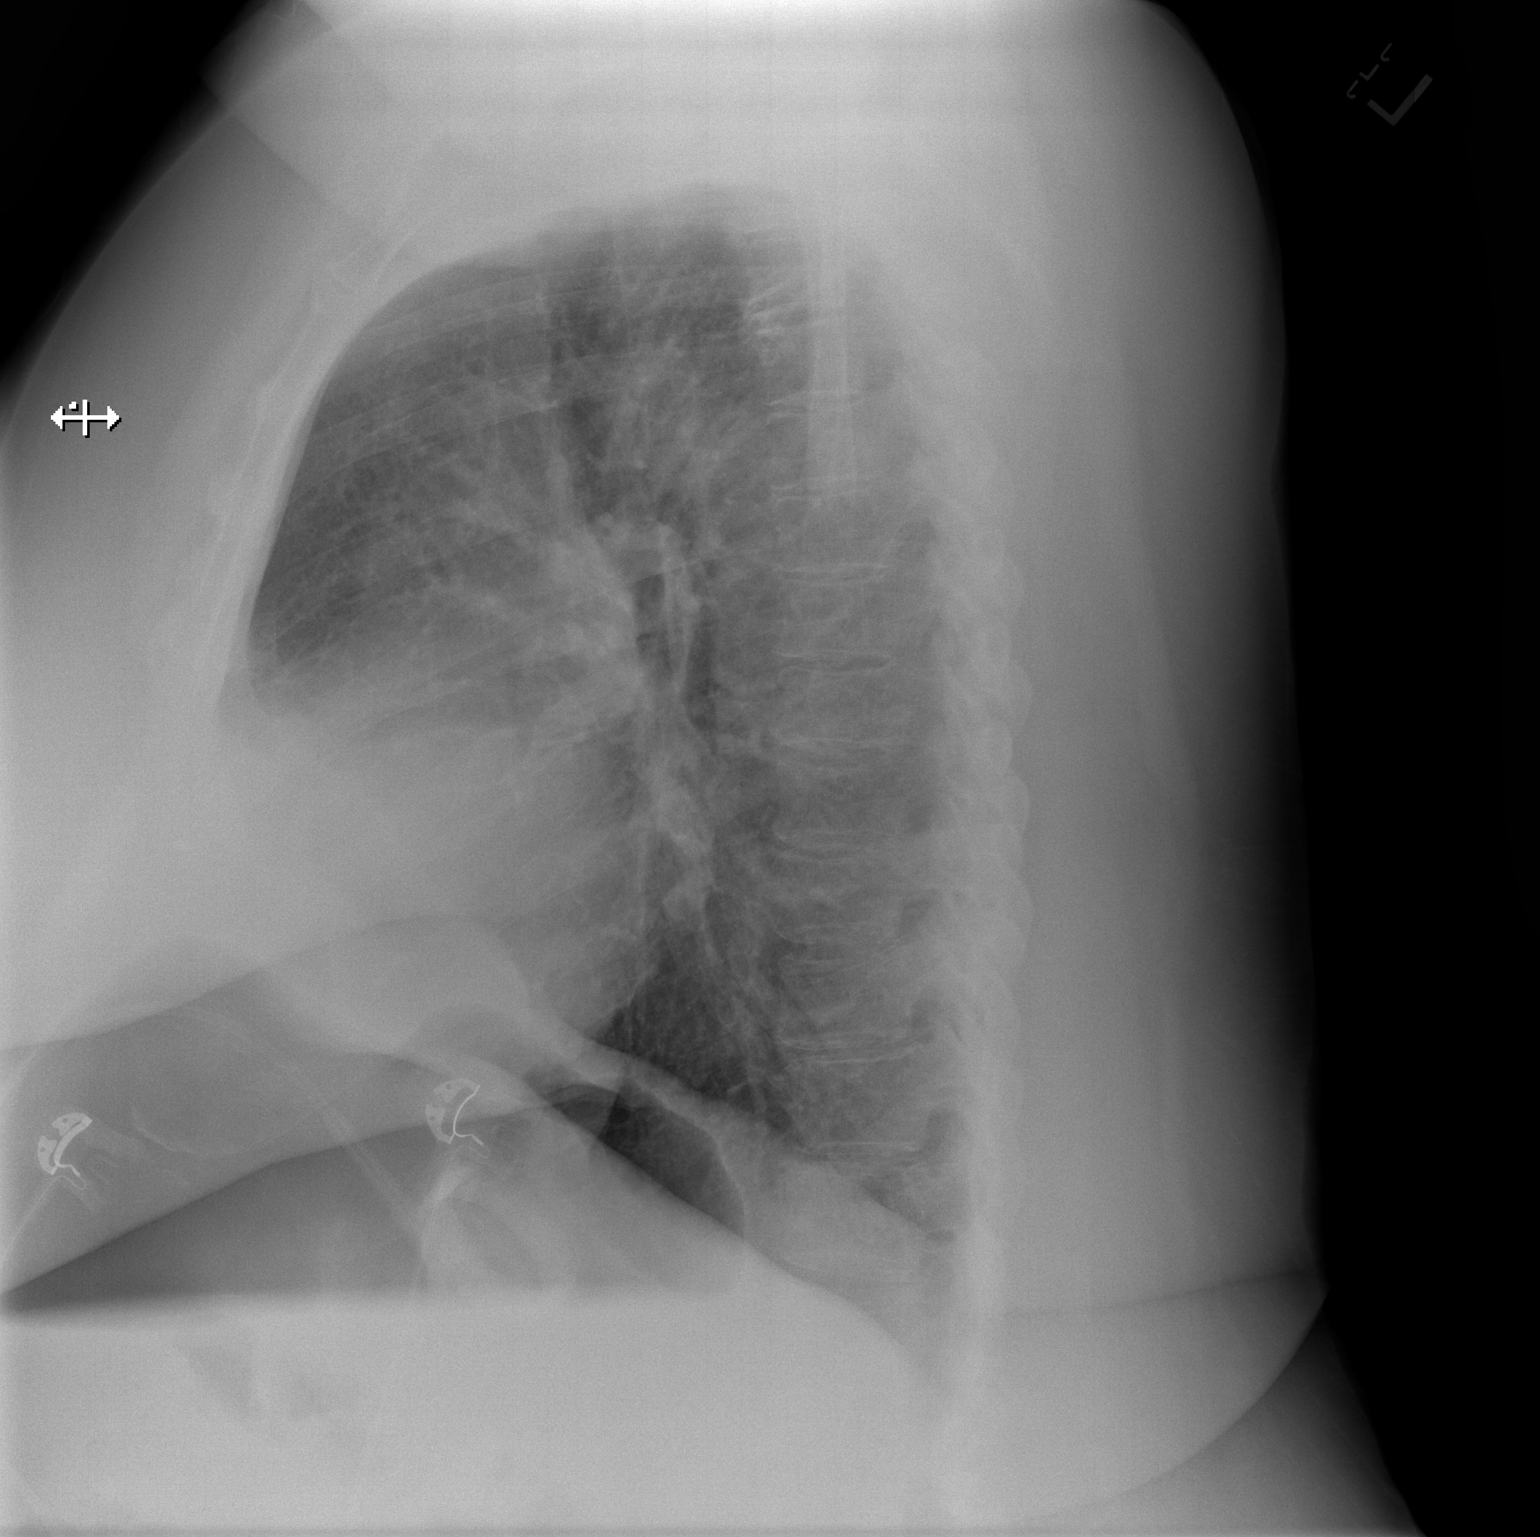

[2 of 2 positions shown; findings below may reference images not displayed]

FINDINGS: Heart and mediastinal contours are within normal limits. No focal
opacities or effusions. No acute bony abnormality.
IMPRESSION: No active cardiopulmonary disease.

## 2022-06-10 ENCOUNTER — Ambulatory Visit: Payer: PRIVATE HEALTH INSURANCE | Admitting: Pulmonary Disease

## 2022-06-10 DIAGNOSIS — J069 Acute upper respiratory infection, unspecified: Secondary | ICD-10-CM | POA: Insufficient documentation

## 2022-06-11 ENCOUNTER — Ambulatory Visit (INDEPENDENT_AMBULATORY_CARE_PROVIDER_SITE_OTHER): Payer: Medicare Other

## 2022-06-11 ENCOUNTER — Telehealth: Payer: Self-pay | Admitting: Orthopaedic Surgery

## 2022-06-11 DIAGNOSIS — Z96642 Presence of left artificial hip joint: Secondary | ICD-10-CM

## 2022-06-11 DIAGNOSIS — M5416 Radiculopathy, lumbar region: Secondary | ICD-10-CM

## 2022-06-11 DIAGNOSIS — M7062 Trochanteric bursitis, left hip: Secondary | ICD-10-CM

## 2022-06-11 DIAGNOSIS — J4550 Severe persistent asthma, uncomplicated: Secondary | ICD-10-CM

## 2022-06-11 NOTE — Telephone Encounter (Signed)
Please advise 

## 2022-06-11 NOTE — Telephone Encounter (Signed)
Patient came in asking if we could put in a referral to [facility] Pain management with Dr. Elta Guadeloupe L. Hardin Negus for her back and left hip pain Fax [phone]  Office [phone]

## 2022-06-17 ENCOUNTER — Ambulatory Visit: Payer: PRIVATE HEALTH INSURANCE | Admitting: Nurse Practitioner

## 2022-06-17 NOTE — Telephone Encounter (Signed)
Referral entered  

## 2022-06-17 NOTE — Telephone Encounter (Signed)
I called patient and advised. 

## 2022-06-17 NOTE — Addendum Note (Signed)
Addended by: Meyer Cory on: 06/17/2022 08:10 AM   Modules accepted: Orders

## 2022-06-18 ENCOUNTER — Ambulatory Visit: Payer: PRIVATE HEALTH INSURANCE | Attending: General Practice | Admitting: General Practice

## 2022-06-18 ENCOUNTER — Encounter: Payer: Self-pay | Admitting: General Practice

## 2022-06-18 VITALS — BP 118/86 | HR 83 | Ht 70.0 in | Wt 278.0 lb

## 2022-06-18 DIAGNOSIS — I5030 Unspecified diastolic (congestive) heart failure: Secondary | ICD-10-CM | POA: Diagnosis not present

## 2022-06-18 DIAGNOSIS — I10 Essential (primary) hypertension: Secondary | ICD-10-CM | POA: Diagnosis not present

## 2022-06-18 DIAGNOSIS — E785 Hyperlipidemia, unspecified: Secondary | ICD-10-CM | POA: Diagnosis not present

## 2022-06-18 DIAGNOSIS — E1169 Type 2 diabetes mellitus with other specified complication: Secondary | ICD-10-CM

## 2022-06-18 DIAGNOSIS — R0609 Other forms of dyspnea: Secondary | ICD-10-CM | POA: Diagnosis not present

## 2022-06-18 MED ORDER — FUROSEMIDE 40 MG PO TABS: 40.0000 mg | ORAL_TABLET | Freq: Every day | ORAL | 9 refills | Status: DC

## 2022-06-18 MED ORDER — ATORVASTATIN CALCIUM 20 MG PO TABS: 20.0000 mg | ORAL_TABLET | Freq: Every day | ORAL | 2 refills | Status: DC

## 2022-06-18 MED ORDER — AMLODIPINE BESYLATE 5 MG PO TABS: 5.0000 mg | ORAL_TABLET | Freq: Every day | ORAL | 3 refills | Status: DC

## 2022-06-18 NOTE — Patient Instructions (Addendum)
Medication Instructions:  The current medical regimen is effective;  continue present plan and medications as directed. Please refer to the Current Medication list given to you today.  *If you need a refill on your cardiac medications before your next appointment, please call your pharmacy*  Lab Work: NONE If you have labs (blood work) drawn today and your tests are completely normal, you will receive your results only by: [place] (if you have MyChart) OR A paper copy in the mail If you have any lab test that is abnormal or we need to change your treatment, we will call you to review the results.  Testing/Procedures: NONE  Other Instructions CONTINUE TO INCENTIVE SPIROMETER AT LEAST 3 TIME DAILY  INCREASE PHYSICAL ACTIVITY AS TOLERATED Follow-Up: At [facility], you and your health needs are our priority.  As part of our continuing mission to provide you with exceptional heart care, we have created designated Provider Care Teams.  These Care Teams include your primary Cardiologist (physician) and Advanced Practice Providers (APPs -  Physician Assistants and Nurse Practitioners) who all work together to provide you with the care you need, when you need it.  Your next appointment:   6-8 month(s)  The format for your next appointment:   In Person  Provider:   Minus Breeding, MD     Important Information About Sugar

## 2022-06-18 NOTE — Progress Notes (Signed)
Cardiology Clinic Note   Patient Name: Patty Howard Date of Encounter: 06/18/2022  Primary Care Provider:  Pcp, No Primary Cardiologist:  Minus Breeding, MD  Patient Profile    Patty Howard 67 year old female presents the clinic today for follow-up evaluation of her DOE and Chronic HF with preserved EF.  Past Medical History    Past Medical History:  Diagnosis Date   Anxiety    Arthritis    Asthma    COPD (chronic obstructive pulmonary disease) ([place])    Fracture    closed displaced left radial head   GERD (gastroesophageal reflux disease)    Hypertension    Sleep apnea    does not wear CPAP   Past Surgical History:  Procedure Laterality Date   BACK SURGERY     spinal stimulator   BREAST BIOPSY Right 2017   benign   COLONOSCOPY W/ BIOPSIES AND POLYPECTOMY     DILATION AND CURETTAGE OF UTERUS     LAPAROSCOPIC ROUX-EN-Y GASTRIC BYPASS WITH UPPER ENDOSCOPY AND REMOVAL OF LAP BAND     RADIAL HEAD ARTHROPLASTY Left 02/03/2019   Procedure: LEFT RADIAL HEAD ARTHROPLASTY;  Surgeon: [name], MD;  Location: [facility];  Service: Orthopedics;  Laterality: Left;  AXILLARY BLOCK VS BIER BLOCK   TOTAL HIP ARTHROPLASTY Left 01/22/2021   Procedure: LEFT TOTAL HIP ARTHROPLASTY ANTERIOR APPROACH;  Surgeon: [name], MD;  Location: [place];  Service: Orthopedics;  Laterality: Left;   TUBAL LIGATION      Allergies  No Known Allergies  History of Present Illness    Patty Howard is a PMH of OSA on CPAP, COPD, asthma, and  HFpEF.  She used to be a Engineer, structural in [place].  She establish care with Dr. Percival Spanish on 12/01/2019.  She was being seen for shortness of breath and chest discomfort.  She had been hospitalized for 2 days prior to her clinic visit with chest discomfort and shortness of breath.  She was treated for HFpEF.  Her echocardiogram 6/21 showed an LVEF of 65-70%, G1 DD, no regional wall motion abnormalities and no significant valvular disease.   She had nuclear stress testing which was negative for ischemia.  She was noted to have aortic atherosclerosis on CTA 2021.  She was also noted to have pulmonary nodules that were identified on CT which are followed by pulmonology.  She was hospitalized 4/23 with COPD exacerbation secondary to influenza A infection.  She was treated with Tamiflu and doxycycline.  She was discharged on prednisone taper.  She was seen in follow-up by cardiology 12/21.  She has a history of nonadherence with her medications.  During that time she continued to take her furosemide as needed.  She presented for follow-up on 10/21/2021 with Angie Duke PA-C.  She was out of her medications at that time.  She noted dyspnea on exertion which was felt to be in part related to her asthma.  She continues to follow with pulmonology and allergy.  She had not been going to the gym for the previous 6 weeks due to dyspnea with exertion.  Her dry weight was noted to be 281 pounds.  She was following with a weight management program.  Her echocardiogram was repeated and showed normal LVEF, moderate concentric LVH, G1 DD, mild dilation of her left atria and no significant valvular abnormalities.  Her medications were refilled and follow-up was planned for around 6 months.  She presents to the clinic today for follow-up evaluation  states she continues to have shortness of breath with increased physical activity.  She denies shortness of breath at rest.  We reviewed her echocardiogram and she expressed understanding.  She continues to be fairly sedentary.  We reviewed the potential causes of her shortness of breath including asthma, reflux, and decreased physical conditioning.  She expressed understanding.  Her weight remains stable.  She has a follow-up appointment scheduled with GI for evaluation of her reflux in the near future.  I recommended that she continue to eat a low-sodium diet, use her incentive spirometer multiple times per day, and  gradually increase her physical activity.  I refilled her cardiac medications and we will plan follow-up in 6 months.  Today she denies chest pain, increased shortness of breath, lower extremity edema, fatigue, palpitations, melena, hematuria, hemoptysis, diaphoresis, weakness, presyncope, syncope, orthopnea, and PND.     Home Medications    Prior to Admission medications   Medication Sig Start Date End Date Taking? Authorizing Provider  albuterol (PROVENTIL) (2.5 MG/3ML) 0.083% nebulizer solution Take 3 mLs (2.5 mg total) by nebulization every 6 (six) hours as needed for wheezing or shortness of breath. 04/23/21   Verlee Monte, MD  albuterol (VENTOLIN HFA) 108 (90 Base) MCG/ACT inhaler Inhale 1-2 puffs into the lungs every 6 (six) hours as needed for wheezing or shortness of breath. 11/27/21   Alfonse Spruce, MD  amLODipine (NORVASC) 5 MG tablet Take 1 tablet (5 mg total) by mouth daily. 10/21/21   Duke, Roe Rutherford, PA  amoxicillin-clavulanate (AUGMENTIN) 875-125 MG tablet Take 1 tablet by mouth 2 (two) times daily. 04/23/22   Parrett, Virgel Bouquet, NP  ascorbic acid (VITAMIN C) 500 MG tablet Take 500 mg by mouth daily.    [provider]  atorvastatin (LIPITOR) 20 MG tablet Take 1 tablet (20 mg total) by mouth daily. 10/23/21   Duke, Roe Rutherford, PA  benzonatate (TESSALON) 200 MG capsule Take 1 capsule (200 mg total) by mouth 3 (three) times daily as needed for cough. 04/23/22   Parrett, Virgel Bouquet, NP  Dupilumab (DUPIXENT [place]) Inject 1 Syringe into the skin every 14 (fourteen) days.    [provider]  Fluticasone-Umeclidin-Vilant Harrel Carina ELLIPTA) 200-62.5-25 MCG/ACT AEPB Inhale 1 puff daily Patient not taking: Reported on 04/29/2022 12/05/21   [place], Buren Kos, MD  Fluticasone-Umeclidin-Vilant (TRELEGY ELLIPTA) 200-62.5-25 MCG/ACT AEPB Inhale 1 puff into the lungs daily at 6 (six) AM. 04/23/22   Parrett, Virgel Bouquet, NP  Fluticasone-Umeclidin-Vilant (TRELEGY ELLIPTA)  200-62.5-25 MCG/ACT AEPB Inhale 1 puff into the lungs daily at 6 (six) AM. Patient not taking: Reported on 04/29/2022 04/23/22   Parrett, Virgel Bouquet, NP  furosemide (LASIX) 40 MG tablet Take 40 mg by mouth daily.    [provider]  guaiFENesin-codeine 100-10 MG/5ML syrup Take 5 mLs by mouth 3 (three) times daily as needed for cough. 04/29/22   Tomma Lightning, MD  ibuprofen (ADVIL) 800 MG tablet Take 1 tablet (800 mg total) by mouth every 8 (eight) hours as needed for moderate pain. 02/17/22   [place], Buren Kos, MD  ipratropium-albuterol (DUONEB) 0.5-2.5 (3) MG/3ML SOLN Take 3 mLs by nebulization every 6 (six) hours as needed. Patient taking differently: Take 3 mLs by nebulization every 6 (six) hours as needed (Wheezing, shortness of breath). 04/23/21   Verlee Monte, MD  levocetirizine (XYZAL) 5 MG tablet Take 1 tablet (5 mg total) by mouth every evening. 04/23/21   Verlee Monte, MD  levothyroxine (SYNTHROID) 100  MCG tablet Take 100 mcg by mouth daily before breakfast.    [provider]  LINZESS 290 MCG CAPS capsule Take 290 mcg by mouth daily. 03/17/22   [provider]  losartan-hydrochlorothiazide (HYZAAR) 100-25 MG tablet Take 1 tablet by mouth daily. 10/21/21   Duke, Tami Lin, PA  Multiple Vitamin (MULTIVITAMIN WITH MINERALS) TABS tablet Take 1 tablet by mouth daily. Centrum Multivitamin    [provider]  pantoprazole (PROTONIX) 40 MG tablet Take 1 tablet (40 mg total) by mouth daily. 11/27/21   Valentina Shaggy, MD  potassium chloride SA (KLOR-CON M) 20 MEQ tablet Take 1 tablet (20 mEq total) by mouth daily. 09/30/21 04/17/22  Barb Merino, MD  Semaglutide, 1 MG/DOSE, 4 MG/3ML SOPN Inject 1 mg as directed once a week. Patient taking differently: Inject 1 mg into the skin once a week. 02/17/22   [place], Blondell Reveal, MD  traMADol (ULTRAM) 50 MG tablet Take 1 tablet (50 mg total) by mouth every 12 (twelve) hours as needed. 02/27/22   [name], MD   Vitamin D, Ergocalciferol, (DRISDOL) 1.25 MG (50000 UNIT) CAPS capsule Take 50,000 Units by mouth every Monday.  10/27/19   [provider]  zafirlukast (ACCOLATE) 20 MG tablet TAKE 1 TABLET (20 MG TOTAL) BY MOUTH 2 (TWO) TIMES DAILY BEFORE A MEAL. Patient taking differently: Take 20 mg by mouth 2 (two) times daily. 02/20/22 04/17/22  Valentina Shaggy, MD  zolpidem (AMBIEN CR) 6.25 MG CR tablet Take 1 tablet (6.25 mg total) by mouth at bedtime as needed for sleep. 04/29/22   Laurin Coder, MD    Family History    Family History  Problem Relation Age of Onset   Hypertension Mother    Stroke Mother    Cancer Father    She indicated that her mother is deceased. She indicated that her father is deceased.  Social History    Social History   Socioeconomic History   Marital status: Widowed    Spouse name: Not on file   Number of children: Not on file   Years of education: Not on file   Highest education level: Not on file  Occupational History   Not on file  Tobacco Use   Smoking status: Former    Packs/day: 0.50    Years: 10.00    Total pack years: 5.00    Types: Cigarettes    Quit date: 06/15/1998    Years since quitting: 24.0   Smokeless tobacco: Never  Vaping Use   Vaping Use: Never used  Substance and Sexual Activity   Alcohol use: Never   Drug use: Never   Sexual activity: Yes  Other Topics Concern   Not on file  Social History Narrative   Three children.  Engineer, structural in [place].     Social Determinants of Health   Financial Resource Strain: Not on file  Food Insecurity: Not on file  Transportation Needs: Not on file  Physical Activity: Not on file  Stress: Not on file  Social Connections: Not on file  Intimate Partner Violence: Not on file     Review of Systems    General:  No chills, fever, night sweats or weight changes.  Cardiovascular:  No chest pain, dyspnea on exertion, edema, orthopnea, palpitations, paroxysmal nocturnal  dyspnea. Dermatological: No rash, lesions/masses Respiratory: No cough, dyspnea Urologic: No hematuria, dysuria Abdominal:   No nausea, vomiting, diarrhea, bright red blood per rectum, melena, or hematemesis Neurologic:  No visual  changes, wkns, changes in mental status. All other systems reviewed and are otherwise negative except as noted above.  Physical Exam    VS:  BP 118/86   Pulse 83   Ht 5\' 10"  (1.778 m)   Wt 278 lb (126.1 kg)   SpO2 98%   BMI 39.89 kg/m  , BMI Body mass index is 39.89 kg/m. GEN: Well nourished, well developed, in no acute distress. HEENT: normal. Neck: Supple, no JVD, carotid bruits, or masses. Cardiac: RRR, no murmurs, rubs, or gallops. No clubbing, cyanosis, edema.  Radials/DP/PT 2+ and equal bilaterally.  Respiratory:  Respirations regular and unlabored, clear to auscultation bilaterally. GI: Soft, nontender, nondistended, BS + x 4. MS: no deformity or atrophy. Skin: warm and dry, no rash. Neuro:  Strength and sensation are intact. Psych: Normal affect.  Accessory Clinical Findings    Recent Labs: 10/21/2021: ALT 24 04/15/2022: B Natriuretic Peptide 36.8 04/18/2022: BUN 18; Creatinine, Ser 0.62; Hemoglobin 10.6; Platelets 312; Potassium 4.5; Sodium 141 04/21/2022: Magnesium 2.0   Recent Lipid Panel    Component Value Date/Time   CHOL 197 10/21/2021 1536   TRIG 56 10/21/2021 1536   HDL 77 10/21/2021 1536   CHOLHDL 2.6 10/21/2021 1536   LDLCALC 110 (H) 10/21/2021 1536   LDLDIRECT 107 (H) 10/21/2021 1536         ECG personally reviewed by me today-none today.  Nuclear stress test 12/20/2019 The left ventricular ejection fraction is hyperdynamic (>65%). Nuclear stress EF: 65%. There was no ST segment deviation noted during stress. No T wave inversion was noted during stress. The study is normal. This is a low risk study.   1. Slightly reduced counts in the apex on rest and stress with normal wall motion consistent with apical thinning  artifact.  2. Normal myocardial perfusion imaging study without ischemia or infarction.  3. TID ratio is upper limits of normal for pharmacological stress. Visually, there is no TID present. Suspect artifactual due to small LV cavity.  4. Normal LVEF, 65%.  5. This is a low-risk study.      Echocardiogram 11/11/2021  IMPRESSIONS     1. Left ventricular ejection fraction, by estimation, is 60 to 65%. The  left ventricle has normal function. The left ventricle has no regional  wall motion abnormalities. There is moderate concentric left ventricular  hypertrophy. Left ventricular  diastolic parameters are consistent with Grade I diastolic dysfunction  (impaired relaxation).   2. Right ventricular systolic function is normal. The right ventricular  size is normal. There is normal pulmonary artery systolic pressure.   3. Left atrial size was mildly dilated.   4. The mitral valve is normal in structure. No evidence of mitral valve  regurgitation. No evidence of mitral stenosis.   5. The aortic valve is tricuspid. Aortic valve regurgitation is not  visualized. No aortic stenosis is present.   6. The inferior vena cava is normal in size with greater than 50%  respiratory variability, suggesting right atrial pressure of 3 mmHg.   FINDINGS   Left Ventricle: Left ventricular ejection fraction, by estimation, is 60  to 65%. The left ventricle has normal function. The left ventricle has no  regional wall motion abnormalities. The left ventricular internal cavity  size was normal in size. There is   moderate concentric left ventricular hypertrophy. Left ventricular  diastolic parameters are consistent with Grade I diastolic dysfunction  (impaired relaxation). Indeterminate filling pressures.   Right Ventricle: The right ventricular size is normal. No increase  in  right ventricular wall thickness. Right ventricular systolic function is  normal. There is normal pulmonary artery systolic  pressure. The tricuspid  regurgitant velocity is 1.89 m/s, and   with an assumed right atrial pressure of 3 mmHg, the estimated right  ventricular systolic pressure is 17.3 mmHg.   Left Atrium: Left atrial size was mildly dilated.   Right Atrium: Right atrial size was normal in size.   Pericardium: There is no evidence of pericardial effusion.   Mitral Valve: The mitral valve is normal in structure. No evidence of  mitral valve regurgitation. No evidence of mitral valve stenosis.   Tricuspid Valve: The tricuspid valve is normal in structure. Tricuspid  valve regurgitation is trivial. No evidence of tricuspid stenosis.   Aortic Valve: The aortic valve is tricuspid. Aortic valve regurgitation is  not visualized. No aortic stenosis is present.   Pulmonic Valve: The pulmonic valve was normal in structure. Pulmonic valve  regurgitation is not visualized. No evidence of pulmonic stenosis.   Aorta: The aortic root is normal in size and structure.   Venous: The inferior vena cava is normal in size with greater than 50%  respiratory variability, suggesting right atrial pressure of 3 mmHg.   IAS/Shunts: No atrial level shunt detected by color flow Doppler.  Assessment & Plan   1.  HFpEF-euvolemic.  Has been going back to baseline physical activity.  Follow-up echocardiogram showed normal LVEF and G1 DD without changes. Continue furosemide, losartan, HCTZ, potassium Heart healthy low-sodium diet-salty 6 given Increase physical activity as tolerated  DOE-stable.  Continues to follow with pulmonology Continue current medical therapy  Hyperlipidemia-LDL 107 10/21/21 Continue atorvastatin Heart healthy low-sodium high-fiber diet Increase physical activity as tolerated  Essential hypertension-BP today 118/86 Continue amlodipine, Hyzaar Heart healthy low-sodium diet Increase physical activity as tolerated   Type 2 diabetes-glucose 124 on 04/18/2022. Carb modified diet Increase  physical activity as tolerated Follows with PCP  Disposition: Follow-up with Dr. Antoine Poche or me in  6 -8 months.   Thomasene Ripple. Adyan Palau NP-C     06/18/2022, 8:31 AM [facility] [address] Office [phone] Fax [phone]    I spent 14 minutes examining this patient, reviewing medications, and using patient centered shared decision making involving her cardiac care.  Prior to her visit I spent greater than 20 minutes reviewing her past medical history,  medications, and prior cardiac tests.

## 2022-06-19 ENCOUNTER — Ambulatory Visit: Payer: PRIVATE HEALTH INSURANCE | Admitting: Gastroenterology

## 2022-06-24 ENCOUNTER — Ambulatory Visit: Payer: PRIVATE HEALTH INSURANCE | Admitting: Orthopaedic Surgery

## 2022-06-25 ENCOUNTER — Ambulatory Visit: Payer: Medicare Other

## 2022-06-26 ENCOUNTER — Ambulatory Visit: Payer: PRIVATE HEALTH INSURANCE | Admitting: General Practice

## 2022-06-26 ENCOUNTER — Telehealth: Payer: Self-pay | Admitting: Orthopaedic Surgery

## 2022-06-26 NOTE — Telephone Encounter (Signed)
[facility] would like patient insurance information faxed over [phone]

## 2022-06-29 ENCOUNTER — Other Ambulatory Visit: Payer: Self-pay | Admitting: Allergy & Immunology

## 2022-07-02 ENCOUNTER — Encounter: Payer: Self-pay | Admitting: Allergy & Immunology

## 2022-07-02 ENCOUNTER — Ambulatory Visit (INDEPENDENT_AMBULATORY_CARE_PROVIDER_SITE_OTHER): Payer: Medicare Other | Admitting: Allergy & Immunology

## 2022-07-02 ENCOUNTER — Other Ambulatory Visit: Payer: Self-pay

## 2022-07-02 VITALS — BP 134/76 | HR 91 | Temp 98.2°F | Resp 20 | Ht 70.0 in | Wt 286.0 lb

## 2022-07-02 DIAGNOSIS — K219 Gastro-esophageal reflux disease without esophagitis: Secondary | ICD-10-CM | POA: Diagnosis not present

## 2022-07-02 DIAGNOSIS — J3089 Other allergic rhinitis: Secondary | ICD-10-CM | POA: Diagnosis not present

## 2022-07-02 DIAGNOSIS — J4550 Severe persistent asthma, uncomplicated: Secondary | ICD-10-CM

## 2022-07-02 DIAGNOSIS — B999 Unspecified infectious disease: Secondary | ICD-10-CM | POA: Diagnosis not present

## 2022-07-02 MED ORDER — TEZEPELUMAB-EKKO 210 MG/1.91ML ~~LOC~~ SOSY
210.0000 mg | PREFILLED_SYRINGE | Freq: Once | SUBCUTANEOUS | Status: AC
  Administered 2022-07-02: 210 mg via SUBCUTANEOUS

## 2022-07-02 NOTE — Progress Notes (Signed)
Immunotherapy   Patient Details  Name: Patty Howard MRN: [id number] Date of Birth: [birth year]  07/02/2022  Davis Gourd started injections for  Tezspire   Frequency: Every 4 Weeks  Epi-Pen: Not Required  Consent signed and patient instructions given.  Patient started Tezspire today and received a 1.43mL Sample in the [facility]. Patient waited in office for 30 minutes and did not experience any symptoms.  Renna Kilmer Fernandez-Vernon 07/02/2022, 6:07 PM

## 2022-07-02 NOTE — Patient Instructions (Addendum)
1. Severe persistent asthma, uncomplicated - Lung testing looked stable today.  - We could consider changing to Tezspire instead Dupixent. - This one blocks higher up than Dupixent and Fasenra and might provide some more relief.  - We are changing you from Trelegy to [facility] instead. - Daily controller medication(s): Breztri two puffs twice daily with spacer + zafirlukast 20mg  twice daily + Tezspire every month  - Prior to physical activity: albuterol 2 puffs 10-15 minutes before physical activity. - Rescue medications: albuterol 4 puffs every 4-6 hours as needed, albuterol nebulizer one vial every 4-6 hours as needed or DuoNeb nebulizer one vial every 4-6 hours as needed - Asthma control goals:  * Full participation in all desired activities (may need albuterol before activity) * Albuterol use two time or less a week on average (not counting use with activity) * Cough interfering with sleep two time or less a month * Oral steroids no more than once a year * No hospitalizations  2. Chronic rhinitis (dust mites, molds) - Continue with Flonase one spray per nostril twice daily to  - Continue with Xyzal (levocetirizine) 5mg  daily to control any postnasal drip.   3. Recurrent infections - We are going to get some addition immune labs to check your immune system. - One of the labs needs to be collected directly at [facility] (address provided).  - Then come tomorrow for the labs that [facility] can collect (and she will tell you where the PCS is) - You are still getting a lot of antibiotics which is concerning.    4. Gastroesophageal reflux disease  - Continue with Protonix 40mg  once daily to control any silent reflux.  5. Return in about 2 months (around 08/31/2022).    Please inform us of any Emergency Department visits, hospitalizations, or changes in symptoms. Call us before going to the ED for breathing or allergy symptoms since we might be able to fit you in for a sick visit. Feel free to  contact us anytime with any questions, problems, or concerns.  It was a pleasure to see you again today!  Websites that have reliable patient information: 1. American Academy of Asthma, Allergy, and Immunology: www.aaaai.org 2. Food Allergy Research and Education (FARE): foodallergy.org 3. Mothers of Asthmatics: http://www.asthmacommunitynetwork.org 4. American College of Allergy, Asthma, and Immunology: www.acaai.org   COVID-19 Vaccine Information can be found at: ShippingScam.co.uk For questions related to vaccine distribution or appointments, please email [email or web address] or call [phone].     "Like" [place] on Facebook and Instagram for our latest updates!        Make sure you are registered to vote! If you have moved or change d any of your contact information, you will need to get this updated before voting!  In some cases, you MAY be able to register to vote online: [email or web address]

## 2022-07-02 NOTE — Progress Notes (Signed)
FOLLOW UP  Date of Service/Encounter:  07/02/22   Assessment:   Asthma-COPD overlap syndrome - with symptoms unchanged on the Dupxient (gave Tezspire today while she was here)      Perennial and seasonal allergic rhinitis (dust mites, molds)   Gastroesophageal reflux disease   Diastolic heart failure - on Lasix    Morbid obesity  Plan/Recommendations:   1. Severe persistent asthma, uncomplicated - Lung testing looked stable today.  - We could consider changing to Tezspire instead Dupixent. - This one blocks higher up than Dupixent and Fasenra and might provide some more relief.  - We are changing you from Trelegy to [facility] instead. - Daily controller medication(s): Breztri two puffs twice daily with spacer + zafirlukast 20mg  twice daily + Tezspire every month  - Prior to physical activity: albuterol 2 puffs 10-15 minutes before physical activity. - Rescue medications: albuterol 4 puffs every 4-6 hours as needed, albuterol nebulizer one vial every 4-6 hours as needed or DuoNeb nebulizer one vial every 4-6 hours as needed - Asthma control goals:  * Full participation in all desired activities (may need albuterol before activity) * Albuterol use two time or less a week on average (not counting use with activity) * Cough interfering with sleep two time or less a month * Oral steroids no more than once a year * No hospitalizations  2. Chronic rhinitis (dust mites, molds) - Continue with Flonase one spray per nostril twice daily to  - Continue with Xyzal (levocetirizine) 5mg  daily to control any postnasal drip.   3. Recurrent infections - We are going to get some addition immune labs to check your immune system. - One of the labs needs to be collected directly at [place] (address provided).  - Then come tomorrow for the labs that [facility] can collect (and she will tell you where the PCS is) - You are still getting a lot of antibiotics which is concerning.    4.  Gastroesophageal reflux disease  - Continue with Protonix 40mg  once daily to control any silent reflux.  5. Return in about 2 months (around 08/31/2022).     Subjective:   Patty Howard is a 67 y.o. female presenting today for follow up of  Chief Complaint  Patient presents with   Medication Refill    Dupixent renewal no concerns    Patty Howard has a history of the following: Patient Active Problem List   Diagnosis Date Noted   COPD exacerbation ([place]) 04/18/2022   Right hip pain 02/17/2022   Cough 12/05/2021   Type 2 diabetes mellitus without complication, without long-term current use of insulin ([place]) 11/05/2021   Trigger finger, left middle finger 10/02/2021   COPD with acute exacerbation ([place]) 09/28/2021   Acute exacerbation of COPD with asthma ([place]) 09/27/2021   Other intervertebral disc degeneration, lumbar region 07/15/2021   History of total hip arthroplasty, left 03/07/2021   Trochanteric bursitis, left hip 12/23/2020   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   Diastolic dysfunction 54/02/8118   Asthma exacerbation 11/13/2020   Menopause 11/08/2020   Medication management 04/30/2020   Gastroesophageal reflux disease 03/14/2020   OSA (obstructive sleep apnea) 02/15/2020   Abnormal findings on diagnostic imaging of lung 01/08/2020   Educated about COVID-19 virus infection 11/30/2019   Aortic atherosclerosis ([place]) 11/30/2019   Chest pain 11/26/2019   Acute asthma exacerbation 05/22/2019   Left radial head fracture 02/03/2019   Closed fracture of radial head 01/29/2019   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) 10/19/2018  Insomnia 10/19/2018   Elbow pain, chronic, left 10/19/2018   Dyspnea 10/12/2017   Pancolitis (HCC) 05/20/2017   H/O Spinal surgery 01/18/2015   Essential hypertension 01/18/2015   Obesity 01/18/2015    History obtained from: chart review and patient.  Patty Howard is a 67 y.o. female presenting for a follow up visit.   She was last seen in June 2023.  At that time, her lung testing looks stable.  We started Dupixent and lieu of Fasenra to see if that helped a little bit more.  We stopped her montelukast and started zafirlukast twice daily instead.  We continued her Trelegy 200 mcg 1 puff daily.  For her rhinitis, we continue with Flonase as well as levocetirizine.  We have previously done an immune workup which has been normal.  Since the last visit, she has mostly done well.  At least that is what she says.  It turns out that she is having a fairly eventful few months since I saw her last.   Asthma/Respiratory Symptom History: She been needing prednisone every 3 to 4 months.  She tends to get sick every 3-4 months. She is having a lot of coughing. She is seeing GI on February. She has seen GI for routine colonoscopies. She was previously on [place]. She does not feel that the Dupixent is helping much more.  It seems that she was actually in the hospital in November for an asthma exacerbation.  She had a chest CT when she was admitted that showed no interstitial lung disease, bandlike scarring, tubular bronchiectasis which was mild in the lower lungs, tracheobronchomalacia on expiratory phase without air trapping, and multiple tiny nodules.  She did follow up with Pulmonology in November after her hospitalization. She was continued on Augmentin which was started at the hospital. She was continued on her prednisone taper. She was continued on her Trelegy as well as montelukast and levocetirizine. She was continued on Dupixent and albuterol as needed. A home sleep study was ordered. She is going to be following up with them in 3-4 months.   She has been using a flutter valve a couple of times per day. This is never a productive cough - despite the diagnosis of bronchiectasis. She never has much in the way of mucous production.   She in retrospect does not feel that the Dupxient did any better than the [facility]. However, she  was getting prednisone 4-6 times while on the [place], which is why we changed her to the Dupixent. She tends to get steroids mostly from her PCP, which I do not think is in the system. She never really reaches out to [place] or lets [place] know when she gets the prednisone, making it hard to track how well she is actually doing.   Allergic Rhinitis Symptom History: She remains on the Flonase and the levocetirizine. She completed her Augmentin from the hospital but she has not been on any other antibiotics at all since that time. She is  on her zafirlukast which I think we changed because she felt that her montelukast was not covering her symptoms for the entirety of the day (felt as if it was wearing off).   She does feel that she continues to get sick frequently. We had previously done an immune workup that was fairly normal, but given the circumstances, we will do some more digging today. She is fine with more labs.   Otherwise, there have been no changes to her past medical history, surgical history, family  history, or social history.    Review of Systems  Constitutional:  Positive for malaise/fatigue. Negative for fever and weight loss.  HENT:  Positive for congestion. Negative for ear discharge, ear pain and sinus pain.   Eyes:  Negative for pain, discharge and redness.  Respiratory:  Positive for cough and shortness of breath. Negative for sputum production and wheezing.   Cardiovascular: Negative.  Negative for chest pain and palpitations.  Gastrointestinal:  Negative for abdominal pain, constipation, diarrhea, heartburn, nausea and vomiting.  Skin: Negative.  Negative for itching and rash.  Neurological:  Negative for dizziness and headaches.  Endo/Heme/Allergies:  Negative for environmental allergies. Does not bruise/bleed easily.       Objective:   Blood pressure 134/76, pulse 91, temperature 98.2 F (36.8 C), resp. rate 20, height 5\' 10"  (1.778 m), weight 286 lb (129.7 kg), SpO2 97  %. Body mass index is 41.04 kg/m.    Physical Exam Vitals reviewed.  Constitutional:      Appearance: She is well-developed.     Comments: Speaking in full sentences without a problem. Talkative and thankful.   HENT:     Head: Normocephalic and atraumatic.     Right Ear: Tympanic membrane, ear canal and external ear normal.     Left Ear: Tympanic membrane, ear canal and external ear normal.     Nose: Rhinorrhea present. No nasal deformity, septal deviation or mucosal edema.     Right Turbinates: Enlarged, swollen and pale.     Left Turbinates: Enlarged, swollen and pale.     Right Sinus: No maxillary sinus tenderness or frontal sinus tenderness.     Left Sinus: No maxillary sinus tenderness or frontal sinus tenderness.     Mouth/Throat:     Mouth: Mucous membranes are not pale and not dry.     Pharynx: Uvula midline.  Eyes:     General: Lids are normal. No allergic shiner.       Right eye: No discharge.        Left eye: No discharge.     Conjunctiva/sclera: Conjunctivae normal.     Right eye: Right conjunctiva is not injected. No chemosis.    Left eye: Left conjunctiva is not injected. No chemosis.    Pupils: Pupils are equal, round, and reactive to light.  Cardiovascular:     Rate and Rhythm: Normal rate and regular rhythm.     Heart sounds: Normal heart sounds.  Pulmonary:     Effort: Pulmonary effort is normal. No tachypnea, accessory muscle usage or respiratory distress.     Breath sounds: Examination of the right-upper field reveals wheezing. Examination of the right-middle field reveals wheezing. Examination of the right-lower field reveals wheezing. Wheezing and rhonchi present. No rales.     Comments: Moving air well in all lung fields.  No increased work of breathing.  Might be some very faint end expiratory wheezes on the right side of her lungs. Chest:     Chest wall: No tenderness.  Lymphadenopathy:     Cervical: No cervical adenopathy.  Skin:    General: Skin  is warm.     Capillary Refill: Capillary refill takes less than 2 seconds.     Coloration: Skin is not pale.     Findings: No abrasion, erythema, petechiae or rash. Rash is not papular, urticarial or vesicular.  Neurological:     Mental Status: She is alert.  Psychiatric:        Behavior: Behavior is cooperative.  Diagnostic studies:    Spirometry: results abnormal (FEV1: 1.31/56%, FVC: 1.85/61%, FEV1/FVC: 71%).    Spirometry consistent with possible restrictive disease. Overall values are slightly higher than those obtained at the last visit.    Allergy Studies: labs sent instead       Salvatore Marvel, MD  [facility]

## 2022-07-03 ENCOUNTER — Emergency Department (HOSPITAL_BASED_OUTPATIENT_CLINIC_OR_DEPARTMENT_OTHER): Payer: Medicare Other

## 2022-07-03 ENCOUNTER — Other Ambulatory Visit: Payer: Self-pay

## 2022-07-03 ENCOUNTER — Encounter (HOSPITAL_BASED_OUTPATIENT_CLINIC_OR_DEPARTMENT_OTHER): Payer: Self-pay

## 2022-07-03 DIAGNOSIS — Z7951 Long term (current) use of inhaled steroids: Secondary | ICD-10-CM | POA: Diagnosis not present

## 2022-07-03 DIAGNOSIS — J45909 Unspecified asthma, uncomplicated: Secondary | ICD-10-CM | POA: Insufficient documentation

## 2022-07-03 DIAGNOSIS — Z79899 Other long term (current) drug therapy: Secondary | ICD-10-CM | POA: Insufficient documentation

## 2022-07-03 DIAGNOSIS — Z87891 Personal history of nicotine dependence: Secondary | ICD-10-CM | POA: Insufficient documentation

## 2022-07-03 DIAGNOSIS — J441 Chronic obstructive pulmonary disease with (acute) exacerbation: Secondary | ICD-10-CM | POA: Diagnosis not present

## 2022-07-03 DIAGNOSIS — Z96642 Presence of left artificial hip joint: Secondary | ICD-10-CM | POA: Insufficient documentation

## 2022-07-03 DIAGNOSIS — I10 Essential (primary) hypertension: Secondary | ICD-10-CM | POA: Diagnosis not present

## 2022-07-03 DIAGNOSIS — R0602 Shortness of breath: Secondary | ICD-10-CM | POA: Diagnosis present

## 2022-07-03 LAB — BASIC METABOLIC PANEL
Anion gap: 9 (ref 5–15)
BUN: 22 mg/dL (ref 8–23)
CO2: 28 mmol/L (ref 22–32)
Calcium: 9.5 mg/dL (ref 8.9–10.3)
Chloride: 109 mmol/L (ref 98–111)
Creatinine, Ser: 0.71 mg/dL (ref 0.44–1.00)
GFR, Estimated: >60 mL/min (ref >60)
Glucose, Bld: 104 mg/dL — ABNORMAL HIGH (ref 70–99)
Potassium: 3.8 mmol/L (ref 3.5–5.1)
Sodium: 146 mmol/L — ABNORMAL HIGH (ref 135–145)

## 2022-07-03 LAB — CBC
HCT: 34.3 % — ABNORMAL LOW (ref 36.0–46.0)
Hemoglobin: 10.7 g/dL — ABNORMAL LOW (ref 12.0–15.0)
MCH: 26.2 pg (ref 26.0–34.0)
MCHC: 31.2 g/dL (ref 30.0–36.0)
MCV: 83.9 fL (ref 80.0–100.0)
Platelets: 310 10*3/uL (ref 150–400)
RBC: 4.09 MIL/uL (ref 3.87–5.11)
RDW: 16.2 % — ABNORMAL HIGH (ref 11.5–15.5)
WBC: 11.6 10*3/uL — ABNORMAL HIGH (ref 4.0–10.5)
nRBC: 0 % (ref 0.0–0.2)

## 2022-07-03 LAB — TROPONIN I (HIGH SENSITIVITY): Troponin I (High Sensitivity): 8 ng/L (ref <18)

## 2022-07-03 NOTE — ED Triage Notes (Addendum)
Patient here POV from Home.  Endorses CP and SOB that began 1 week ago. Tight in nature and Constant. Most Left Sided and radiates to left Lateral Chest. History of Asthma. Wheezing noted upon Auscultation. Last Nebulizer Treatment was 4 Hours ago.   NAD noted during Triage. A&Ox4. GCS 15. Ambulatory.

## 2022-07-04 ENCOUNTER — Encounter: Payer: Self-pay | Admitting: Allergy & Immunology

## 2022-07-04 ENCOUNTER — Emergency Department (HOSPITAL_BASED_OUTPATIENT_CLINIC_OR_DEPARTMENT_OTHER)
Admission: EM | Admit: 2022-07-04 | Discharge: 2022-07-04 | Disposition: A | Discharge status: Home / Self Care | Payer: Medicare Other | Attending: Emergency Medicine | Admitting: Emergency Medicine

## 2022-07-04 DIAGNOSIS — J441 Chronic obstructive pulmonary disease with (acute) exacerbation: Secondary | ICD-10-CM

## 2022-07-04 MED ORDER — IPRATROPIUM-ALBUTEROL 0.5-2.5 (3) MG/3ML IN SOLN
3.0000 mL | RESPIRATORY_TRACT | Status: DC
  Filled 2022-07-04: qty 3

## 2022-07-04 MED ORDER — DEXAMETHASONE 2 MG PO TABS: ORAL_TABLET | ORAL | 0 refills | Status: DC

## 2022-07-04 MED ORDER — MAGNESIUM SULFATE 2 GM/50ML IV SOLN
2.0000 g | Freq: Once | INTRAVENOUS | Status: AC
  Administered 2022-07-04: 2 g via INTRAVENOUS
  Filled 2022-07-04: qty 50

## 2022-07-04 MED ORDER — AEROCHAMBER PLUS FLO-VU LARGE MISC
1.0000 | Freq: Once | Status: AC
  Administered 2022-07-04: 1
  Filled 2022-07-04: qty 1

## 2022-07-04 MED ORDER — ALBUTEROL SULFATE (2.5 MG/3ML) 0.083% IN NEBU
INHALATION_SOLUTION | RESPIRATORY_TRACT | Status: AC
  Administered 2022-07-04: 2.5 mg via RESPIRATORY_TRACT
  Filled 2022-07-04: qty 3

## 2022-07-04 MED ORDER — DEXAMETHASONE SODIUM PHOSPHATE 10 MG/ML IJ SOLN
10.0000 mg | Freq: Once | INTRAMUSCULAR | Status: AC
  Administered 2022-07-04: 10 mg via INTRAVENOUS
  Filled 2022-07-04: qty 1

## 2022-07-04 MED ORDER — ALBUTEROL SULFATE (2.5 MG/3ML) 0.083% IN NEBU: 2.5000 mg | INHALATION_SOLUTION | Freq: Once | RESPIRATORY_TRACT | Status: AC

## 2022-07-04 NOTE — ED Notes (Signed)
RT educated pt on proper use of MDI w/spacer. Pt able to perform w/out difficulty. Verbalizes understanding.

## 2022-07-04 NOTE — ED Provider Notes (Signed)
[facility] Provider Note: Lowella Dell, MD, FACEP  CSN: [id number] MRN: [id number] ARRIVAL: 07/03/22 at 2223 ROOM: DB013/DB013   CHIEF COMPLAINT  Shortness of Breath   HISTORY OF PRESENT ILLNESS  07/04/22 2:49 AM Patty Howard is a 67 y.o. female with a history of COPD.  She is here with shortness of breath and chest tightness for about the last week.  It is worse with exertion and even walking across the room makes her out of breath.  She has been using her DuoNeb treatments without adequate relief.  The last neb treatment was about 4 hours prior to arrival.  She was also having some pain in her chest when she coughs or takes a deep breath.  She has not had a fever.   Past Medical History:  Diagnosis Date   Anxiety    Arthritis    Asthma    COPD (chronic obstructive pulmonary disease) (HCC)    Fracture    closed displaced left radial head   GERD (gastroesophageal reflux disease)    Hypertension    Sleep apnea    does not wear CPAP    Past Surgical History:  Procedure Laterality Date   BACK SURGERY     spinal stimulator   BREAST BIOPSY Right 2017   benign   COLONOSCOPY W/ BIOPSIES AND POLYPECTOMY     DILATION AND CURETTAGE OF UTERUS     LAPAROSCOPIC ROUX-EN-Y GASTRIC BYPASS WITH UPPER ENDOSCOPY AND REMOVAL OF LAP BAND     RADIAL HEAD ARTHROPLASTY Left 02/03/2019   Procedure: LEFT RADIAL HEAD ARTHROPLASTY;  Surgeon: Eldred Manges, MD;  Location: [facility] OR;  Service: Orthopedics;  Laterality: Left;  AXILLARY BLOCK VS BIER BLOCK   TOTAL HIP ARTHROPLASTY Left 01/22/2021   Procedure: LEFT TOTAL HIP ARTHROPLASTY ANTERIOR APPROACH;  Surgeon: Eldred Manges, MD;  Location: [facility] OR;  Service: Orthopedics;  Laterality: Left;   TUBAL LIGATION      Family History  Problem Relation Age of Onset   Hypertension Mother    Stroke Mother    Cancer Father     Social History   Tobacco Use   Smoking status: Former    Packs/day: 0.50    Years: 10.00    Total pack years:  5.00    Types: Cigarettes    Quit date: 06/15/1998    Years since quitting: 24.0   Smokeless tobacco: Never  Vaping Use   Vaping Use: Never used  Substance Use Topics   Alcohol use: Never   Drug use: Never    Prior to Admission medications   Medication Sig Start Date End Date Taking? Authorizing Provider  dexamethasone (DECADRON) 2 MG tablet Take all 5 tablets on 07/05/2022. 07/04/22  Yes Staphanie Harbison, MD  albuterol (PROVENTIL) (2.5 MG/3ML) 0.083% nebulizer solution Take 3 mLs (2.5 mg total) by nebulization every 6 (six) hours as needed for wheezing or shortness of breath. 04/23/21   Verlee Monte, MD  albuterol (VENTOLIN HFA) 108 (90 Base) MCG/ACT inhaler Inhale 1-2 puffs into the lungs every 6 (six) hours as needed for wheezing or shortness of breath. 11/27/21   Alfonse Spruce, MD  amLODipine (NORVASC) 5 MG tablet Take 1 tablet (5 mg total) by mouth daily. 06/18/22   Ronney Asters, NP  ascorbic acid (VITAMIN C) 500 MG tablet Take 500 mg by mouth daily.    [provider]  atorvastatin (LIPITOR) 20 MG tablet Take 1 tablet (20 mg total) by mouth daily. 06/18/22  Deberah Pelton, NP  Dupilumab (DUPIXENT [place]) Inject 1 Syringe into the skin every 14 (fourteen) days.    [provider]  Fluticasone-Umeclidin-Vilant (TRELEGY ELLIPTA) 200-62.5-25 MCG/ACT AEPB Inhale 1 puff daily 12/05/21   de [place], Blondell Reveal, MD  Fluticasone-Umeclidin-Vilant (TRELEGY ELLIPTA) 200-62.5-25 MCG/ACT AEPB Inhale 1 puff into the lungs daily at 6 (six) AM. 04/23/22   Parrett, Fonnie Mu, NP  Fluticasone-Umeclidin-Vilant (TRELEGY ELLIPTA) 200-62.5-25 MCG/ACT AEPB Inhale 1 puff into the lungs daily at 6 (six) AM. 04/23/22   Parrett, Fonnie Mu, NP  furosemide (LASIX) 40 MG tablet Take 1 tablet (40 mg total) by mouth daily. 06/18/22   Deberah Pelton, NP  ibuprofen (ADVIL) 800 MG tablet Take 1 tablet (800 mg total) by mouth every 8 (eight) hours as needed for moderate pain. 02/17/22   [place], Blondell Reveal, MD   ipratropium-albuterol (DUONEB) 0.5-2.5 (3) MG/3ML SOLN Take 3 mLs by nebulization every 6 (six) hours as needed. Patient taking differently: Take 3 mLs by nebulization every 6 (six) hours as needed (Wheezing, shortness of breath). 04/23/21   Clemon Chambers, MD  levocetirizine (XYZAL) 5 MG tablet Take 1 tablet (5 mg total) by mouth every evening. 04/23/21   Clemon Chambers, MD  levothyroxine (SYNTHROID) 100 MCG tablet Take 100 mcg by mouth daily before breakfast.    [provider]  LINZESS 290 MCG CAPS capsule Take 290 mcg by mouth daily. 03/17/22   [provider]  losartan-hydrochlorothiazide (HYZAAR) 100-25 MG tablet Take 1 tablet by mouth daily. 10/21/21   [facility], Tami Lin, PA  Multiple Vitamin (MULTIVITAMIN WITH MINERALS) TABS tablet Take 1 tablet by mouth daily. Centrum Multivitamin    [provider]  pantoprazole (PROTONIX) 40 MG tablet Take 1 tablet (40 mg total) by mouth daily. 11/27/21   Valentina Shaggy, MD  potassium chloride SA (KLOR-CON M) 20 MEQ tablet Take 1 tablet (20 mEq total) by mouth daily. 09/30/21 06/18/22  Barb Merino, MD  Semaglutide, 1 MG/DOSE, 4 MG/3ML SOPN Inject 1 mg as directed once a week. Patient taking differently: Inject 1 mg into the skin once a week. 02/17/22   [place], Blondell Reveal, MD  traMADol (ULTRAM) 50 MG tablet Take 1 tablet (50 mg total) by mouth every 12 (twelve) hours as needed. 02/27/22   [name], MD  Vitamin D, Ergocalciferol, (DRISDOL) 1.25 MG (50000 UNIT) CAPS capsule Take 50,000 Units by mouth every Monday.  10/27/19   [provider]  zafirlukast (ACCOLATE) 20 MG tablet TAKE 1 TABLET (20 MG TOTAL) BY MOUTH 2 (TWO) TIMES DAILY BEFORE A MEAL. Patient taking differently: Take 20 mg by mouth 2 (two) times daily. 02/20/22 06/18/22  Valentina Shaggy, MD  zolpidem (AMBIEN CR) 6.25 MG CR tablet Take 1 tablet (6.25 mg total) by mouth at bedtime as needed for sleep. 04/29/22   Laurin Coder, MD     Allergies Patient has no known allergies.   REVIEW OF SYSTEMS  Negative except as noted here or in the History of Present Illness.   PHYSICAL EXAMINATION  Initial Vital Signs Blood pressure (!) 143/85, pulse 92, temperature 98.4 F (36.9 C), resp. rate 17, height 5\' 10"  (1.778 m), weight 129.7 kg, SpO2 100 %.  Examination General: Well-developed, well-nourished female in no acute distress; appearance consistent with age of record HENT: normocephalic; atraumatic Eyes: Appearance Neck: supple Heart: regular rate and rhythm; frequent PACs Lungs: Faint inspiratory and expiratory wheezes; coughing paroxysms on attempted deep breaths Abdomen: soft; nondistended;  nontender; bowel sounds present Extremities: No deformity; full range of motion; pulses normal Neurologic: Awake, alert and oriented; motor function intact in all extremities and symmetric; no facial droop Skin: Warm and dry Psychiatric: Normal mood and affect   RESULTS  Summary of this visit's results, reviewed and interpreted by myself:   EKG Interpretation  Date/Time:  Friday July 03 2022 22:44:59 EST Ventricular Rate:  105 PR Interval:  138 QRS Duration: 88 QT Interval:  380 QTC Calculation: 502 R Axis:   36 Text Interpretation: Sinus tachycardia with Premature supraventricular complexes Otherwise normal ECG Previously Sinus Tachycardia Confirmed by Latajah Thuman [phone]) on 07/03/2022 10:48:54 PM       Laboratory Studies: Results for orders placed or performed during the hospital encounter of 07/04/22 (from the past 24 hour(s))  Basic metabolic panel     Status: Abnormal   Collection Time: 07/03/22 10:45 PM  Result Value Ref Range   Sodium 146 (H) 135 - 145 mmol/L   Potassium 3.8 3.5 - 5.1 mmol/L   Chloride 109 98 - 111 mmol/L   CO2 28 22 - 32 mmol/L   Glucose, Bld 104 (H) 70 - 99 mg/dL   BUN 22 8 - 23 mg/dL   Creatinine, Ser 0.71 0.44 - 1.00 mg/dL   Calcium 9.5 8.9 - 10.3 mg/dL   GFR, Estimated  >60 >60 mL/min   Anion gap 9 5 - 15  CBC     Status: Abnormal   Collection Time: 07/03/22 10:45 PM  Result Value Ref Range   WBC 11.6 (H) 4.0 - 10.5 K/uL   RBC 4.09 3.87 - 5.11 MIL/uL   Hemoglobin 10.7 (L) 12.0 - 15.0 g/dL   HCT 34.3 (L) 36.0 - 46.0 %   MCV 83.9 80.0 - 100.0 fL   MCH 26.2 26.0 - 34.0 pg   MCHC 31.2 30.0 - 36.0 g/dL   RDW 16.2 (H) 11.5 - 15.5 %   Platelets 310 150 - 400 K/uL   nRBC 0.0 0.0 - 0.2 %  Troponin I (High Sensitivity)     Status: None   Collection Time: 07/03/22 10:45 PM  Result Value Ref Range   Troponin I (High Sensitivity) 8 <18 ng/L   Imaging Studies: DG Chest Port 1 View  Result Date: 07/03/2022 CLINICAL DATA:  Chest pain EXAM: PORTABLE CHEST 1 VIEW COMPARISON:  Chest x-ray 04/21/2022 FINDINGS: There are minimal strandy opacities at the lung bases favored is atelectasis. Is no pleural effusion or pneumothorax. The cardiomediastinal silhouette is within normal limits. No acute fractures are seen. IMPRESSION: Minimal strandy opacities at the lung bases favored is atelectasis. Electronically Signed   By: Ronney Asters M.D.   On: 07/03/2022 23:10    ED COURSE and MDM  Nursing notes, initial and subsequent vitals signs, including pulse oximetry, reviewed and interpreted by myself.  Vitals:   07/03/22 2243 07/04/22 0231 07/04/22 0301 07/04/22 0319  BP:  (!) 143/85    Pulse:  92 80 70  Resp:  17 17 16   Temp:      SpO2:  100% 100% 100%  Weight: 129.7 kg     Height: 5\' 10"  (1.778 m)      Medications  magnesium sulfate IVPB 2 g 50 mL (2 g Intravenous New Bag/Given 07/04/22 0353)  dexamethasone (DECADRON) injection 10 mg (10 mg Intravenous Given 07/04/22 0353)  AeroChamber Plus Flo-Vu Large MISC 1 each (1 each Other Given 07/04/22 0317)  albuterol (PROVENTIL) (2.5 MG/3ML) 0.083% nebulizer solution 2.5 mg (2.5 mg  Nebulization Given 07/04/22 0317)   4:37 AM Patient feeling better after DuoNeb treatment as well as IV dexamethasone and magnesium sulfate.  She  is now able to take deep breaths without coughing.  She states she is ready to go home.  Will give her another dose of dexamethasone for tomorrow.  She has an appointment pending with her pulmonologist next month.   PROCEDURES  Procedures   ED DIAGNOSES     ICD-10-CM   1. COPD with acute exacerbation (HCC)  J44.1          Treysen Sudbeck, Jonny Ruiz, MD 07/04/22 [phone]

## 2022-07-06 ENCOUNTER — Telehealth: Payer: Self-pay | Admitting: *Deleted

## 2022-07-06 NOTE — Telephone Encounter (Signed)
Called patient and advised approval and submit for Tezspire to [facility] and will reach out once delivery set to make next appt for injection

## 2022-07-06 NOTE — Telephone Encounter (Signed)
-----  Message from Valentina Shaggy, MD sent at 07/04/2022  8:47 AM EST ----- Margurite Auerbach instead of Dupixent. She is still getting a lot of steroids from a variety of people, but mostly her PCP, for asthma/breathing.

## 2022-07-07 ENCOUNTER — Ambulatory Visit: Payer: PRIVATE HEALTH INSURANCE | Admitting: Orthopaedic Surgery

## 2022-07-07 NOTE — Telephone Encounter (Signed)
Thanks Tammy 

## 2022-07-08 ENCOUNTER — Ambulatory Visit: Payer: PRIVATE HEALTH INSURANCE | Admitting: Physician Assistant

## 2022-07-14 LAB — B CELL SUBSET ANALYSIS
Activated CD21low CD38- %: 9.8 % of CD19 — ABNORMAL HIGH (ref 1.2–9.0)
Activated CD21low CD38-: 56 cells/uL — ABNORMAL HIGH (ref 3–26)
CD19+ B cells %: 13.4 % Lymphs (ref 6.4–22.0)
CD19+ B cells: 572 cells/uL — ABNORMAL HIGH (ref 110–450)
CD20+ %: 99.4 % of CD19 (ref 96.0–100.0)
CD20+: 568 cells/uL — ABNORMAL HIGH (ref 110–450)
Class-switched CD27+IgD-IgM- %: 30.3 % of CD19 — ABNORMAL HIGH (ref 5.1–22.0)
Class-switched CD27+IgD-IgM-: 173 cells/uL — ABNORMAL HIGH (ref 11–61)
Non switched CD27+IgD+IgM+ %: 17 % of CD19 — ABNORMAL HIGH (ref 2.4–15.0)
Non switched CD27+IgD+IgM+: 97 cells/uL — ABNORMAL HIGH (ref 5–46)
Plasmablasts CD38+IgM- %: 0.2 % of CD19 — ABNORMAL LOW (ref 0.4–4.1)
Plasmablasts CD38+IgM-: 1 cells/uL (ref 1–8)
Total Memory CD27+ %: 51.7 % of CD19 — ABNORMAL HIGH (ref 10.0–33.0)
Total Memory CD27+: 296 cells/uL — ABNORMAL HIGH (ref 23–110)
Transitional CD38+IgM+ %: 1.2 % of CD19 (ref 0.7–5.9)
Transitional CD38+IgM+: 7 cells/uL (ref 1–17)

## 2022-07-15 LAB — T-HELPER CELLS CD4/CD8 %
% CD 4 Pos. Lymph.: 59.6 % — ABNORMAL HIGH (ref 30.8–58.5)
Absolute CD 4 Helper: 2205 /uL — ABNORMAL HIGH (ref 359–1519)
CD3+CD4+ Cells/CD3+CD8+ Cells Bld: 2.13 (ref 0.92–3.72)
CD3+CD8+ Cells # Bld: 1036 /uL — ABNORMAL HIGH (ref 109–897)
CD3+CD8+ Cells NFr Bld: 28 % (ref 12.0–35.5)

## 2022-07-15 LAB — LYMPH ENUMERATION, BASIC & NK CELLS
% CD 3 Pos. Lymph.: 81.7 % (ref 57.5–86.2)
% CD 4 Pos. Lymph.: 61.5 % — ABNORMAL HIGH (ref 30.8–58.5)
% NK (CD56/16): 5.1 % (ref 1.4–19.4)
Ab NK (CD56/16): 189 /uL (ref 24–406)
Absolute CD 3: 3023 /uL — ABNORMAL HIGH (ref 622–2402)
Absolute CD 4 Helper: 2276 /uL — ABNORMAL HIGH (ref 359–1519)
Basophils Absolute: 0 10*3/uL (ref 0.0–0.2)
Basos: 0 %
CD19 % B Cell: 12.7 % (ref 3.3–25.4)
CD19 Abs: 470 /uL (ref 12–645)
CD4/CD8 Ratio: 2.37 (ref 0.92–3.72)
CD8 % Suppressor T Cell: 25.9 % (ref 12.0–35.5)
CD8 T Cell Abs: 958 /uL — ABNORMAL HIGH (ref 109–897)
EOS (ABSOLUTE): 0 10*3/uL (ref 0.0–0.4)
Eos: 0 %
Hematocrit: 38.2 % (ref 34.0–46.6)
Hemoglobin: 11.8 g/dL (ref 11.1–15.9)
Immature Grans (Abs): 0 10*3/uL (ref 0.0–0.1)
Immature Granulocytes: 0 %
Lymphocytes Absolute: 3.7 10*3/uL — ABNORMAL HIGH (ref 0.7–3.1)
Lymphs: 31 %
MCH: 26 pg — ABNORMAL LOW (ref 26.6–33.0)
MCHC: 30.9 g/dL — ABNORMAL LOW (ref 31.5–35.7)
MCV: 84 fL (ref 79–97)
Monocytes Absolute: 0.5 10*3/uL (ref 0.1–0.9)
Monocytes: 5 %
Neutrophils Absolute: 7.4 10*3/uL — ABNORMAL HIGH (ref 1.4–7.0)
Neutrophils: 64 %
Platelets: 362 10*3/uL (ref 150–450)
RBC: 4.54 x10E6/uL (ref 3.77–5.28)
RDW: 15 % (ref 11.7–15.4)
WBC: 11.7 10*3/uL — ABNORMAL HIGH (ref 3.4–10.8)

## 2022-07-15 LAB — STREP PNEUMONIAE 23 SEROTYPES IGG
Pneumo Ab Type 1*: 0.6 ug/mL — ABNORMAL LOW (ref >1.3)
Pneumo Ab Type 12 (12F)*: 0.1 ug/mL — ABNORMAL LOW (ref >1.3)
Pneumo Ab Type 14*: 0.2 ug/mL — ABNORMAL LOW (ref >1.3)
Pneumo Ab Type 17 (17F)*: 1.4 ug/mL (ref >1.3)
Pneumo Ab Type 19 (19F)*: 0.7 ug/mL — ABNORMAL LOW (ref >1.3)
Pneumo Ab Type 2*: 2 ug/mL (ref >1.3)
Pneumo Ab Type 20*: 3.4 ug/mL (ref >1.3)
Pneumo Ab Type 22 (22F)*: 0.3 ug/mL — ABNORMAL LOW (ref >1.3)
Pneumo Ab Type 23 (23F)*: 0.3 ug/mL — ABNORMAL LOW (ref >1.3)
Pneumo Ab Type 26 (6B)*: 0.6 ug/mL — ABNORMAL LOW (ref >1.3)
Pneumo Ab Type 3*: 0.7 ug/mL — ABNORMAL LOW (ref >1.3)
Pneumo Ab Type 34 (10A)*: 1.2 ug/mL — ABNORMAL LOW (ref >1.3)
Pneumo Ab Type 4*: 1.3 ug/mL — ABNORMAL LOW (ref >1.3)
Pneumo Ab Type 43 (11A)*: 0.6 ug/mL — ABNORMAL LOW (ref >1.3)
Pneumo Ab Type 5*: 0.8 ug/mL — ABNORMAL LOW (ref >1.3)
Pneumo Ab Type 51 (7F)*: 0.5 ug/mL — ABNORMAL LOW (ref >1.3)
Pneumo Ab Type 54 (15B)*: <0.1 ug/mL — ABNORMAL LOW (ref >1.3)
Pneumo Ab Type 56 (18C)*: 0.2 ug/mL — ABNORMAL LOW (ref >1.3)
Pneumo Ab Type 57 (19A)*: 0.6 ug/mL — ABNORMAL LOW (ref >1.3)
Pneumo Ab Type 68 (9V)*: 0.4 ug/mL — ABNORMAL LOW (ref >1.3)
Pneumo Ab Type 70 (33F)*: 1 ug/mL — ABNORMAL LOW (ref >1.3)
Pneumo Ab Type 8*: 3 ug/mL (ref >1.3)
Pneumo Ab Type 9 (9N)*: 0.3 ug/mL — ABNORMAL LOW (ref >1.3)

## 2022-07-23 ENCOUNTER — Telehealth: Payer: Self-pay | Admitting: *Deleted

## 2022-07-23 ENCOUNTER — Ambulatory Visit (INDEPENDENT_AMBULATORY_CARE_PROVIDER_SITE_OTHER): Payer: Medicare Other | Admitting: Physician Assistant

## 2022-07-23 ENCOUNTER — Encounter: Payer: Self-pay | Admitting: Physician Assistant

## 2022-07-23 VITALS — BP 128/82 | HR 91 | Ht 69.0 in | Wt 287.0 lb

## 2022-07-23 DIAGNOSIS — J4550 Severe persistent asthma, uncomplicated: Secondary | ICD-10-CM

## 2022-07-23 DIAGNOSIS — R09A2 Foreign body sensation, throat: Secondary | ICD-10-CM | POA: Diagnosis not present

## 2022-07-23 DIAGNOSIS — K219 Gastro-esophageal reflux disease without esophagitis: Secondary | ICD-10-CM | POA: Diagnosis not present

## 2022-07-23 DIAGNOSIS — R1310 Dysphagia, unspecified: Secondary | ICD-10-CM

## 2022-07-23 DIAGNOSIS — R195 Other fecal abnormalities: Secondary | ICD-10-CM | POA: Diagnosis not present

## 2022-07-23 MED ORDER — NA SULFATE-K SULFATE-MG SULF 17.5-3.13-1.6 GM/177ML PO SOLN: 1.0000 | Freq: Once | ORAL | 0 refills | Status: AC

## 2022-07-23 MED ORDER — PANTOPRAZOLE SODIUM 40 MG PO TBEC: 40.0000 mg | DELAYED_RELEASE_TABLET | Freq: Two times a day (BID) | ORAL | 2 refills | Status: DC

## 2022-07-23 NOTE — Telephone Encounter (Signed)
Dr. Silverio Decamp,  This pt's COPD and asthma is very poorly controlled as evidenced by her multiple ER admissions over the last year for this dx.  She is not a candidate for outpt sedation and her procedure will need to be performed at the hospital.  Thanks,  Osvaldo Angst

## 2022-07-23 NOTE — Patient Instructions (Signed)
_______________________________________________________  If your blood pressure at your visit was 140/90 or greater, please contact your primary care physician to follow up on this.  _______________________________________________________  If you are age 69 or older, your body mass index should be between 23-30. Your Body mass index is 42.38 kg/m. If this is out of the aforementioned range listed, please consider follow up with your Primary Care Provider.  If you are age 44 or younger, your body mass index should be between 19-25. Your Body mass index is 42.38 kg/m. If this is out of the aformentioned range listed, please consider follow up with your Primary Care Provider.   ________________________________________________________  The [facility] providers would like to encourage you to use [facility] to communicate with providers for non-urgent requests or questions.  Due to long hold times on the telephone, sending your provider a message by [facility] may be a faster and more efficient way to get a response.  Please allow 48 business hours for a response.  Please remember that this is for non-urgent requests.  _______________________________________________________ We have sent the following medications to your pharmacy for you to pick up at your convenience:  Protonix - increase to twice a day  Take over the counter Mucinex twice daily to thin secretions  You have been scheduled for an endoscopy and colonoscopy. Please follow the written instructions given to you at your visit today. Please pick up your prep supplies at the pharmacy within the next 1-3 days. If you use inhalers (even only as needed), please bring them with you on the day of your procedure.

## 2022-07-23 NOTE — Progress Notes (Signed)
Subjective:    Patient ID: Patty Howard, female    DOB: 02-19-1956, 67 y.o.   MRN: [id number]  HPI  Patty Howard is a pleasant 67 year old female, new to GI self-referred for evaluation of fullness in her throat/lump sensation in the throat, and also more recently found to have positive FOBT. Patient has history of hypertension, sleep apnea, asthma, COPD, adult onset diabetes mellitus obesity, and congestive heart failure with grade 1 diastolic dysfunction.  Last echo 2023 EF of 60 to 65% and no AS. She says she has not had prior endoscopy.  She has been having a lot of issues with chronic rhinitis and sinus type drainage with thick mucus which has been particularly bothersome over the past 2 to 3 weeks.  Since that time she is also complaining of a lump or fullness in her throat, hoarseness and a feeling of need to clear her throat frequently.  She feels like when she he tries to swallow she has to swallow hard to make things go down but again because of a sense of mucus in the back of her throat.  Not having any difficulty with liquids.  Sometimes has dysphagia to solids and usually will drink fluids to try to push solids down.  She says she had an episode of regurgitation yesterday and brought up a lot of thick mucus.  Appetite has been fine, weight has been stable.  She had not really been having ongoing issues with GERD but has been on pantoprazole 40 mg daily fairly long-term. She also has chronic constipation symptoms and does take Linzess 290 mcg and says that works well. She did have a prior colonoscopy done while she was living in [place] in 2017.  This is visible in her chart.  She had one 3 mm serrated polyp removed and was noted to have a few diverticuli.  Biopsy from the polyp showed this to be a hyperplastic polyp. Patient says she has not noticed any issues with abdominal pain, changes in bowel habits melena or hematochezia.  The fecal occult blood test was done as part of annual  screening.  She has no family history of colon cancer that she is aware of.  Review of Systems Pertinent positive and negative review of systems were noted in the above HPI section.  All other review of systems was otherwise negative.   Outpatient Encounter Medications as of 07/23/2022  Medication Sig   albuterol (PROVENTIL) (2.5 MG/3ML) 0.083% nebulizer solution Take 3 mLs (2.5 mg total) by nebulization every 6 (six) hours as needed for wheezing or shortness of breath.   albuterol (VENTOLIN HFA) 108 (90 Base) MCG/ACT inhaler Inhale 1-2 puffs into the lungs every 6 (six) hours as needed for wheezing or shortness of breath.   amLODipine (NORVASC) 5 MG tablet Take 1 tablet (5 mg total) by mouth daily.   ascorbic acid (VITAMIN C) 500 MG tablet Take 500 mg by mouth daily.   atorvastatin (LIPITOR) 20 MG tablet Take 1 tablet (20 mg total) by mouth daily.   dexamethasone (DECADRON) 2 MG tablet Take all 5 tablets on 07/05/2022.   Fluticasone-Umeclidin-Vilant (TRELEGY ELLIPTA) 200-62.5-25 MCG/ACT AEPB Inhale 1 puff into the lungs daily at 6 (six) AM.   furosemide (LASIX) 40 MG tablet Take 1 tablet (40 mg total) by mouth daily.   ibuprofen (ADVIL) 800 MG tablet Take 1 tablet (800 mg total) by mouth every 8 (eight) hours as needed for moderate pain.   ipratropium-albuterol (DUONEB) 0.5-2.5 (3) MG/3ML SOLN Take 3 mLs  by nebulization every 6 (six) hours as needed. (Patient taking differently: Take 3 mLs by nebulization every 6 (six) hours as needed (Wheezing, shortness of breath).)   levocetirizine (XYZAL) 5 MG tablet Take 1 tablet (5 mg total) by mouth every evening.   levothyroxine (SYNTHROID) 100 MCG tablet Take 100 mcg by mouth daily before breakfast.   LINZESS 290 MCG CAPS capsule Take 290 mcg by mouth daily.   losartan-hydrochlorothiazide (HYZAAR) 100-25 MG tablet Take 1 tablet by mouth daily.   Multiple Vitamin (MULTIVITAMIN WITH MINERALS) TABS tablet Take 1 tablet by mouth daily. Centrum Multivitamin    Na Sulfate-K Sulfate-Mg Sulf 17.5-3.13-1.6 GM/177ML SOLN Take 1 kit by mouth once for 1 dose.   Semaglutide, 1 MG/DOSE, 4 MG/3ML SOPN Inject 1 mg as directed once a week. (Patient taking differently: Inject 1 mg into the skin once a week.)   traMADol (ULTRAM) 50 MG tablet Take 1 tablet (50 mg total) by mouth every 12 (twelve) hours as needed.   Vitamin D, Ergocalciferol, (DRISDOL) 1.25 MG (50000 UNIT) CAPS capsule Take 50,000 Units by mouth every Monday.    zolpidem (AMBIEN CR) 6.25 MG CR tablet Take 1 tablet (6.25 mg total) by mouth at bedtime as needed for sleep.   [DISCONTINUED] pantoprazole (PROTONIX) 40 MG tablet Take 1 tablet (40 mg total) by mouth daily.   pantoprazole (PROTONIX) 40 MG tablet Take 1 tablet (40 mg total) by mouth 2 (two) times daily.   potassium chloride SA (KLOR-CON M) 20 MEQ tablet Take 1 tablet (20 mEq total) by mouth daily.   zafirlukast (ACCOLATE) 20 MG tablet TAKE 1 TABLET (20 MG TOTAL) BY MOUTH 2 (TWO) TIMES DAILY BEFORE A MEAL. (Patient taking differently: Take 20 mg by mouth 2 (two) times daily.)   No facility-administered encounter medications on file as of 07/23/2022.   No Known Allergies Patient Active Problem List   Diagnosis Date Noted   COPD exacerbation ([place]) 04/18/2022   Right hip pain 02/17/2022   Cough 12/05/2021   Type 2 diabetes mellitus without complication, without long-term current use of insulin ([place]) 11/05/2021   Trigger finger, left middle finger 10/02/2021   COPD with acute exacerbation ([place]) 09/28/2021   Acute exacerbation of COPD with asthma ([place]) 09/27/2021   Other intervertebral disc degeneration, lumbar region 07/15/2021   History of total hip arthroplasty, left 03/07/2021   Trochanteric bursitis, left hip 12/23/2020   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   Diastolic dysfunction 54/02/8118   Asthma exacerbation 11/13/2020   Menopause 11/08/2020   Medication management 04/30/2020   Gastroesophageal reflux disease 03/14/2020   OSA  (obstructive sleep apnea) 02/15/2020   Abnormal findings on diagnostic imaging of lung 01/08/2020   Educated about COVID-19 virus infection 11/30/2019   Aortic atherosclerosis ([place]) 11/30/2019   Chest pain 11/26/2019   Acute asthma exacerbation 05/22/2019   Left radial head fracture 02/03/2019   Closed fracture of radial head 01/29/2019   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) 10/19/2018   Insomnia 10/19/2018   Elbow pain, chronic, left 10/19/2018   Dyspnea 10/12/2017   Pancolitis ([place]) 05/20/2017   H/O Spinal surgery 01/18/2015   Essential hypertension 01/18/2015   Obesity 01/18/2015   Social History   Socioeconomic History   Marital status: Widowed    Spouse name: Not on file   Number of children: Not on file   Years of education: Not on file   Highest education level: Not on file  Occupational History   Not on file  Tobacco Use   Smoking  status: Former    Packs/day: 0.50    Years: 10.00    Total pack years: 5.00    Types: Cigarettes    Quit date: 06/15/1998    Years since quitting: 24.1   Smokeless tobacco: Never  Vaping Use   Vaping Use: Never used  Substance and Sexual Activity   Alcohol use: Never   Drug use: Never   Sexual activity: Yes  Other Topics Concern   Not on file  Social History Narrative   Three children.  Engineer, structural in [place].     Social Determinants of Health   Financial Resource Strain: Not on file  Food Insecurity: Not on file  Transportation Needs: Not on file  Physical Activity: Not on file  Stress: Not on file  Social Connections: Not on file  Intimate Partner Violence: Not on file    [name]'s family history includes Hypertension in her mother; Lung cancer in her father; Stroke in her mother.      Objective:    Vitals:   07/23/22 0934  BP: 128/82  Pulse: 91  SpO2: 97%    Physical Exam Well-developed well-nourished older AA female n no acute distress.  Height, Weight,287  BMI 42.3  HEENT;  nontraumatic normocephalic, EOMI, PE R LA, sclera anicteric. Oropharynx;no oral thrush Neck; supple, no JVD, no enlarged thyroid Cardiovascular; regular rate and rhythm with S1-S2, no murmur rub or gallop Pulmonary; Clear bilaterally Abdomen; soft, nontender, nondistended, no palpable mass or hepatosplenomegaly, bowel sounds are active Rectal;not done Skin; benign exam, no jaundice rash or appreciable lesions Extremities; no clubbing cyanosis or edema skin warm and dry Neuro/Psych; alert and oriented x4, grossly nonfocal mood and affect appropriate        Assessment & Plan:   #89 67 year old female with history of GERD, generally well-controlled with Protonix now with 2-week history of fairly constant sensation of fullness or lump sensation in throat, increased hoarseness, sensation of thick mucus in the throat and vague dysphagia to solids. Patient has ongoing issues with asthma, COPD and chronic rhinitis.  I suspect there is at least a component secondary to the chronic rhinitis and increased mucus over the past couple of weeks question exacerbation of COPD. May have overlap of globus, silent GERD.  #2 positive fecal occult blood test done at PCP-patient is asymptomatic Last colonoscopy 2017/[place] one 3 mm hyperplastic polyp and diverticulosis.  Will need to rule out occult neoplasm, recurrent polyps etc.  #3 hypertension #4.  Sleep apnea no oxygen use #5.  COPD #6.  Asthma #7.  Adult onset diabetes mellitus #8.  Obesity with BMI 42 #9  Grade 1 diastolic dysfunction/EF 60 to 65%  Plan Will increase Protonix to 40 mg p.o. twice daily AC short-term Patient advised to start over-the-counter plain Mucinex twice daily to thin secretions She plans to follow-up with her allergist this week, has used steroids for similar symptoms in the past. Patient will be scheduled for EGD possible dilation and colonoscopy with Dr. Silverio Decamp  in the [facility].  Procedures were discussed in detail with the  patient including indications risk and benefits and she is agreeable to proceed. (Her chart lists history of tracheobronchial malacia.  Patient is not aware of any difficulties with sedation/anesthesia in the past, but because this is mentioned I will forward her chart to anesthesia/[place] to review)  Alfredia Ferguson PA-C 07/23/2022   Cc: No ref. provider found

## 2022-07-24 ENCOUNTER — Telehealth: Payer: Self-pay

## 2022-07-24 NOTE — Telephone Encounter (Signed)
She was recently In the emergency room for COPD exacerbation  Can we make sure she has a  follow-up appointment soon

## 2022-07-24 NOTE — Telephone Encounter (Signed)
[name] X1 LVM for patient to call the office back. When she calls back she needs to be scheduled for a hospital f/u

## 2022-07-24 NOTE — Telephone Encounter (Signed)
Patient notified of the cancellation. Clearance request sent to pulmonology.

## 2022-07-24 NOTE — Telephone Encounter (Signed)
Beth, patient will need to be scheduled at hospital , please request pre procedure pulmonary clearance. Thanks

## 2022-07-24 NOTE — Telephone Encounter (Signed)
Pre-operative Risk Assessment     Request for surgical clearance:     Endoscopy Procedure  What type of surgery is being performed?     EGD and colonoscopy  When is this surgery scheduled?     To be determined once she is cleared  What type of clearance is required ?   Pharmacy  Are there any medications that need to be held prior to surgery and how long? No-medical clearance only  Practice name and name of physician performing surgery?      [facility] Dr Harl Bowie  What is your office phone and fax number?      Phone- [phone]  [phone]  Anesthesia type (None, local, MAC, general) ?       MAC

## 2022-07-27 NOTE — Telephone Encounter (Signed)
L/m tezspire  due to be delivered to clinic 2/14 and can call [facility] to make appt to get started

## 2022-07-30 ENCOUNTER — Other Ambulatory Visit: Payer: Self-pay

## 2022-07-30 ENCOUNTER — Ambulatory Visit (INDEPENDENT_AMBULATORY_CARE_PROVIDER_SITE_OTHER): Payer: Medicare Other | Admitting: Allergy & Immunology

## 2022-07-30 DIAGNOSIS — J4550 Severe persistent asthma, uncomplicated: Secondary | ICD-10-CM

## 2022-07-30 DIAGNOSIS — B999 Unspecified infectious disease: Secondary | ICD-10-CM | POA: Diagnosis not present

## 2022-07-30 DIAGNOSIS — K219 Gastro-esophageal reflux disease without esophagitis: Secondary | ICD-10-CM | POA: Diagnosis not present

## 2022-07-30 DIAGNOSIS — D806 Antibody deficiency with near-normal immunoglobulins or with hyperimmunoglobulinemia: Secondary | ICD-10-CM

## 2022-07-30 DIAGNOSIS — J3089 Other allergic rhinitis: Secondary | ICD-10-CM

## 2022-07-30 MED ORDER — BREZTRI AEROSPHERE 160-9-4.8 MCG/ACT IN AERO: 2.0000 | INHALATION_SPRAY | Freq: Two times a day (BID) | RESPIRATORY_TRACT | 3 refills | Status: DC

## 2022-07-30 MED ORDER — AZITHROMYCIN 500 MG PO TABS: 500.0000 mg | ORAL_TABLET | ORAL | 3 refills | Status: DC

## 2022-07-30 MED ORDER — TEZEPELUMAB-EKKO 210 MG/1.91ML ~~LOC~~ SOSY
210.0000 mg | PREFILLED_SYRINGE | SUBCUTANEOUS | Status: DC
  Administered 2022-07-30 – 2023-09-14 (×12): 210 mg via SUBCUTANEOUS

## 2022-07-30 NOTE — Patient Instructions (Addendum)
1. Severe persistent asthma, uncomplicated - Lung testing not done today.  - We are not going to make any changes today. - Daily controller medication(s): Breztri two puffs twice daily with spacer + zafirlukast 36m twice daily + Tezspire every month  - Prior to physical activity: albuterol 2 puffs 10-15 minutes before physical activity. - Rescue medications: albuterol 4 puffs every 4-6 hours as needed, albuterol nebulizer one vial every 4-6 hours as needed or DuoNeb nebulizer one vial every 4-6 hours as needed - Asthma control goals:  * Full participation in all desired activities (may need albuterol before activity) * Albuterol use two time or less a week on average (not counting use with activity) * Cough interfering with sleep two time or less a month * Oral steroids no more than once a year * No hospitalizations  2. Chronic rhinitis (dust mites, molds) - Continue with Flonase one spray per nostril twice daily to  - Continue with Xyzal (levocetirizine) 533mdaily to control any postnasal drip.   3. 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 - We are going to start a prophylactic antibiotic to see if this can help clear up infections (and infections for you tend to lead to asthma exacerbations). - Start azithromycin 50028mhree times weekly (MONDAY, WEDNESDAY, FRIDAY). - This is likely going to be a lifetime treatment.  - Start a probiotic as well.   4. Gastroesophageal reflux disease  - Continue with Protonix 109m63mce daily to control any silent reflux.  5. Return in about 2 months (around 09/28/2022).    Please inform us oKoreaany Emergency Department visits, hospitalizations, or changes in symptoms. Call us bKoreaore going to the ED for breathing or allergy symptoms since we might be able to fit you in for a sick visit. Feel free to contact us aKoreatime with any  questions, problems, or concerns.  It was a pleasure to see you again today!  Websites that have reliable patient information: 1. American Academy of Asthma, Allergy, and Immunology: www.aaaai.org 2. Food Allergy Research and Education (FARE): foodallergy.org 3. Mothers of Asthmatics: http://www.asthmacommunitynetwork.org 4. American College of Allergy, Asthma, and Immunology: www.acaai.org   COVID-19 Vaccine Information can be found at: [email or web address] questions related to vaccine distribution or appointments, please email [email or web address] or call [phone]  "Like" us oKoreaFacebook and Instagram for our latest updates!        Make sure you are registered to vote! If you have moved or change d any of your contact information, you will need to get this updated before voting!  In some cases, you MAY be able to register to vote online: [email or web address]

## 2022-07-30 NOTE — Progress Notes (Signed)
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   Plan/Recommendations:   1. Severe persistent asthma, uncomplicated - Lung testing not done today.  - We are not going to make any medication changes today.  - Daily controller medication(s): Breztri two puffs twice daily with spacer + zafirlukast 49m twice daily + Tezspire every month  - Prior to physical activity: albuterol 2 puffs 10-15 minutes before physical activity. - Rescue medications: albuterol 4 puffs every 4-6 hours as needed, albuterol nebulizer one vial every 4-6 hours as needed or DuoNeb nebulizer one vial every 4-6 hours as needed - Asthma control goals:  * Full participation in all desired activities (may need albuterol before activity) * Albuterol use two time or less a week on average (not counting use with activity) * Cough interfering with sleep two time or less a month * Oral steroids no more than once a year * No hospitalizations  2. Chronic rhinitis (dust mites, molds) - Continue with Flonase one spray per nostril twice daily to  - Continue with Xyzal (levocetirizine) 574mdaily to control any postnasal drip.   3. 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 - We are going to start a prophylactic antibiotic to see if this can help clear up infections (and infections for you tend to lead to asthma exacerbations). - Start azithromycin 50072mhree times weekly (MONDAY, WEDNESDAY, FRIDAY). - This is likely going to be  a lifetime treatment.  - Start a probiotic as well.   4. Gastroesophageal reflux disease  - Continue with Protonix 49m32mce daily to control any silent reflux.  5. Return in about 2 months (around 09/28/2022).   Subjective:   Patty Howard 67 y42. female presenting today for follow up of  Chief Complaint  Patient presents with   Follow-up    Patient here today to review labs.    Patty Howard a history of the following: Patient Active Problem List   Diagnosis Date Noted   COPD exacerbation (HCC)[place]/09/2021   Right hip pain 02/17/2022   Cough 12/05/2021   Type 2 diabetes mellitus without complication, without long-term current use of insulin (HCC)[place]/24/2023   Trigger finger, left middle finger 10/02/2021   COPD with acute exacerbation (HCC)[place]/16/2023   Acute exacerbation of COPD with asthma (HCC)[place]/15/2023   Other intervertebral disc degeneration, lumbar region 07/15/2021   History of total hip arthroplasty, left 03/07/2021   Trochanteric bursitis, left hip 12/23/2020   Tracheobronchomalacia 06/1A999333iastolic dysfunction 06/1A999333sthma exacerbation 11/13/2020   Menopause 11/08/2020   Medication management 04/30/2020   Gastroesophageal reflux disease 03/14/2020   OSA (obstructive sleep apnea) 02/15/2020   Abnormal findings on diagnostic imaging of lung 01/08/2020   Educated about COVID-19 virus infection 11/30/2019   Aortic atherosclerosis (HCC)[place]/17/2021   Chest pain 11/26/2019   Acute asthma exacerbation 05/22/2019   Left radial head fracture 02/03/2019   Closed fracture of radial head 01/29/2019  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) 10/19/2018   Insomnia 10/19/2018   Elbow pain, chronic, left 10/19/2018   Dyspnea 10/12/2017   Pancolitis ([employer]) 05/20/2017   H/O Spinal surgery 01/18/2015   Essential hypertension 01/18/2015   Obesity 01/18/2015    History obtained from: chart review and patient.  Patty Howard is  a 67 y.o. female presenting for a follow up visit.  She was last seen in January 2024.  At that time, her lung testing looks stable.  We talked about changing her biologic and we did end up changing her to Tezspire at the last visit.  We changed her from Trelegy to [facility] 2 puffs twice daily.  We also continued with zafirlukast 20 mg twice daily.  For her rhinitis, we continue with Flonase and Xyzal.  We obtained some labs to look at her immune system.  We continue with Protonix once daily to control reflux.  Her labs were notable for decrease in protection against streptococcal pneumonia.  She was only protective to 4 out of 23 strains despite getting the Pneumovax.  We also looked at your B cells.  She had a higher percentage of unswitched memory B cells.   Review of her Streptococcal titers:      Since last visit, she has done very well. She has been doing her pulse ox and her oxygen has 97% today.  Asthma/Respiratory Symptom History: She reports that she is still getting out of breath occasionally and she has some hoarseness. She is actually going to have that addressed via endoscopy in the near future. This is going to be done in the hospital given her high risk status.  is going to get that done in the psring time somewher and she is going to be admitted to the hospital.  She remains on the [facility] 2 puffs twice daily.  This seems to be working well to control her symptoms.  She has not been on prednisone.  She feels overall like her breathing is stable.  Allergic Rhinitis Symptom History: She remains on the Flonase as well as the levocetirizine 5 mg daily.  She has required antibiotics a few times since we saw her last time.  We did talk about her labs today.  She prefers to start on prophylactic azithromycin instead of immunoglobulin infusions.  If there is no improvement with the prophylactic antibiotics, we will talk immunoglobulin at the next visit.  GERD Symptom History: She remains on  Protonix 40 mg daily.  She is having the endoscopy performed in the near future as noted above.  Otherwise, there have been no changes to her past medical history, surgical history, family history, or social history.    Review of Systems  Constitutional:  Negative for fever, malaise/fatigue and weight loss.  HENT:  Positive for congestion. Negative for ear discharge, ear pain and sinus pain.   Eyes:  Negative for pain, discharge and redness.  Respiratory:  Positive for cough and shortness of breath. Negative for sputum production and wheezing.   Cardiovascular: Negative.  Negative for chest pain and palpitations.  Gastrointestinal:  Negative for abdominal pain, constipation, diarrhea, heartburn, nausea and vomiting.  Skin: Negative.  Negative for itching and rash.  Neurological:  Negative for dizziness and headaches.  Endo/Heme/Allergies:  Positive for environmental allergies. Does not bruise/bleed easily.       Objective:   Vitals reviewed and all within normal limits.    Physical Exam Vitals reviewed.  Constitutional:      Appearance: She is  well-developed.     Comments: Speaking in full sentences without a problem. Talkative and thankful.   HENT:     Head: Normocephalic and atraumatic.     Right Ear: Tympanic membrane, ear canal and external ear normal.     Left Ear: Tympanic membrane, ear canal and external ear normal.     Nose: Rhinorrhea present. No nasal deformity, septal deviation or mucosal edema.     Right Turbinates: Enlarged, swollen and pale.     Left Turbinates: Enlarged, swollen and pale.     Right Sinus: No maxillary sinus tenderness or frontal sinus tenderness.     Left Sinus: No maxillary sinus tenderness or frontal sinus tenderness.     Mouth/Throat:     Lips: Pink. No lesions.     Mouth: Mucous membranes are moist. Mucous membranes are not pale and not dry.     Pharynx: Uvula midline.     Comments: Mild cobblestoning present in the posterior  oropharynx. Eyes:     General: Lids are normal. No allergic shiner.       Right eye: No discharge.        Left eye: No discharge.     Conjunctiva/sclera: Conjunctivae normal.     Right eye: Right conjunctiva is not injected. No chemosis.    Left eye: Left conjunctiva is not injected. No chemosis.    Pupils: Pupils are equal, round, and reactive to light.  Cardiovascular:     Rate and Rhythm: Normal rate and regular rhythm.     Heart sounds: Normal heart sounds.  Pulmonary:     Effort: Pulmonary effort is normal. No tachypnea, accessory muscle usage or respiratory distress.     Breath sounds: Rhonchi present. No wheezing or rales.     Comments: Moving air well in all lung fields.  No increased work of breathing.   Chest:     Chest wall: No tenderness.  Lymphadenopathy:     Cervical: No cervical adenopathy.  Skin:    General: Skin is warm.     Capillary Refill: Capillary refill takes less than 2 seconds.     Coloration: Skin is not pale.     Findings: No abrasion, erythema, petechiae or rash. Rash is not papular, urticarial or vesicular.  Neurological:     Mental Status: She is alert.  Psychiatric:        Behavior: Behavior is cooperative.      Diagnostic studies:    Spirometry: results abnormal (FEV1: 1.31/56%, FVC: 1.85/61%, FEV1/FVC: 71%).    Spirometry consistent with possible restrictive disease.  Overall, values are higher than those obtained previously.    Allergy Studies: none        Salvatore Marvel, MD  [facility] [place] [facility]

## 2022-08-03 ENCOUNTER — Encounter: Payer: Self-pay | Admitting: Allergy & Immunology

## 2022-08-03 MED ORDER — ALBUTEROL SULFATE (2.5 MG/3ML) 0.083% IN NEBU: 2.5000 mg | INHALATION_SOLUTION | Freq: Four times a day (QID) | RESPIRATORY_TRACT | 1 refills | Status: DC | PRN

## 2022-08-03 MED ORDER — LEVOCETIRIZINE DIHYDROCHLORIDE 5 MG PO TABS: 5.0000 mg | ORAL_TABLET | Freq: Every evening | ORAL | 1 refills | Status: DC

## 2022-08-03 MED ORDER — ALBUTEROL SULFATE HFA 108 (90 BASE) MCG/ACT IN AERS: 1.0000 | INHALATION_SPRAY | Freq: Four times a day (QID) | RESPIRATORY_TRACT | 4 refills | Status: DC | PRN

## 2022-08-05 ENCOUNTER — Telehealth: Payer: Self-pay | Admitting: Gastroenterology

## 2022-08-05 NOTE — Telephone Encounter (Signed)
Inbound call from pt, she was schedule for Endo/Colon she 08/18/2022 but appt got cancel she want to know if she can reschedule .Please advise

## 2022-08-05 NOTE — Telephone Encounter (Signed)
Patient advised we are waiting for medical clearance from her pulmonologist. She states she was going to be there tomorrow and she will inquire.

## 2022-08-06 NOTE — Telephone Encounter (Signed)
Inbound call from patient, states she spoke with pulmonologist, and they stated they had received no request from [place]. Requesting to resend request

## 2022-08-06 NOTE — Telephone Encounter (Signed)
I believe this request for medical clearance may have been closed in error. Thank you for your response and help with this mutual patient.

## 2022-08-06 NOTE — Telephone Encounter (Signed)
Request for medical clearance was received by Pulmonology. It looks like it may have been closed instead of routing it back with the response. Routed the request to the doctor.

## 2022-08-07 NOTE — Telephone Encounter (Signed)
Needs a follow up visit post recent ED visit for clearance

## 2022-08-07 NOTE — Telephone Encounter (Signed)
Just fyi patient is scheduled for a f/u with you on 02/29

## 2022-08-13 ENCOUNTER — Encounter: Payer: Self-pay | Admitting: Pulmonary Disease

## 2022-08-13 ENCOUNTER — Ambulatory Visit (INDEPENDENT_AMBULATORY_CARE_PROVIDER_SITE_OTHER): Payer: Medicare Other | Admitting: Pulmonary Disease

## 2022-08-13 VITALS — BP 140/80 | HR 117 | Ht 69.0 in | Wt 284.0 lb

## 2022-08-13 DIAGNOSIS — G4733 Obstructive sleep apnea (adult) (pediatric): Secondary | ICD-10-CM

## 2022-08-13 NOTE — Progress Notes (Signed)
Is anybody waiting   Subjective:    Patient ID: Patty Howard, female    DOB: 07/13/1955, 67 y.o.   MRN: [id number]  She is in for follow-up today  Has been following up with Dr. Ernst Bowler, allergy and asthma  She is on Tezspire, rescue inhaler use as needed She usually will use nebulizer in the morning, does have some cough and secretion clearance in the morning  Denies any significant exacerbation at present  She is yet to have home sleep study performed   No fevers, no chills, she does use cough medicine to help clear secretions  She does have a background history of asthma/COPD, history of hypertension  She was previously on CPAP regularly but machine became dysfunctional     Past Medical History:  Diagnosis Date   Anxiety    Arthritis    Asthma    COPD (chronic obstructive pulmonary disease) ([place])    Fracture    closed displaced left radial head   GERD (gastroesophageal reflux disease)    Hypertension    Sleep apnea    does not wear CPAP   Social History   Socioeconomic History   Marital status: Widowed    Spouse name: Not on file   Number of children: Not on file   Years of education: Not on file   Highest education level: Not on file  Occupational History   Not on file  Tobacco Use   Smoking status: Former    Packs/day: 0.50    Years: 10.00    Total pack years: 5.00    Types: Cigarettes    Quit date: 06/15/1998    Years since quitting: 24.1   Smokeless tobacco: Never  Vaping Use   Vaping Use: Never used  Substance and Sexual Activity   Alcohol use: Never   Drug use: Never   Sexual activity: Yes  Other Topics Concern   Not on file  Social History Narrative   Three children.  Engineer, structural in [place].     Social Determinants of Health   Financial Resource Strain: Not on file  Food Insecurity: Not on file  Transportation Needs: Not on file  Physical Activity: Not on file  Stress: Not on file  Social Connections: Not on file  Intimate  Partner Violence: Not on file   Family History  Problem Relation Age of Onset   Hypertension Mother    Stroke Mother    Lung cancer Father    Colon cancer Neg Hx    Stomach cancer Neg Hx    Pancreatic cancer Neg Hx     Review of Systems  Constitutional:  Negative for fever and unexpected weight change.  HENT:  Positive for congestion. Negative for dental problem, ear pain, nosebleeds, postnasal drip, rhinorrhea, sinus pressure, sneezing, sore throat and trouble swallowing.   Eyes:  Negative for redness and itching.  Respiratory:  Positive for cough, shortness of breath and wheezing. Negative for chest tightness.   Cardiovascular:  Negative for palpitations and leg swelling.  Gastrointestinal:  Negative for nausea and vomiting.  Genitourinary:  Negative for dysuria.  Musculoskeletal:  Negative for joint swelling.  Skin:  Negative for rash.  Allergic/Immunologic: Positive for environmental allergies and food allergies. Negative for immunocompromised state.  Neurological:  Negative for headaches.  Hematological:  Does not bruise/bleed easily.  Psychiatric/Behavioral:  Negative for dysphoric mood. The patient is nervous/anxious.        Objective:   Physical Exam Constitutional:      Appearance: She  is obese.  HENT:     Head: Normocephalic and atraumatic.     Mouth/Throat:     Mouth: Mucous membranes are moist.  Eyes:     Pupils: Pupils are equal, round, and reactive to light.  Cardiovascular:     Rate and Rhythm: Normal rate.     Pulses: Normal pulses.     Heart sounds: Normal heart sounds. No murmur heard.    No friction rub.  Pulmonary:     Effort: Pulmonary effort is normal. No respiratory distress.     Breath sounds: No stridor. No wheezing or rhonchi.  Musculoskeletal:     Cervical back: No rigidity or tenderness.  Neurological:     General: No focal deficit present.     Mental Status: She is alert.  Psychiatric:        Mood and Affect: Mood normal.    There  were no vitals filed for this visit.     04/29/2022    8:00 AM 04/05/2019    2:00 PM  Results of the Epworth flowsheet  Sitting and reading 3 3  Watching TV 2 3  Sitting, inactive in a public place (e.g. a theatre or a meeting) 2 3  As a passenger in a car for an hour without a break 2 3  Lying down to rest in the afternoon when circumstances permit 2 3  Sitting and talking to someone 0 3  Sitting quietly after a lunch without alcohol 0 3  In a car, while stopped for a few minutes in traffic 0 3  Total score 11 24   CT scan of the chest was reviewed with the patient -CT scan was performed 04/21/2022 This was compared with the previous CT 09/27/2021 and also CT 11/26/2019 -CAT scans do not reveal any evidence of interstitial lung disease, there is some mild bronchiectasis, small lung nodule  Last PFT on record is 06/30/2021 showing moderate obstructive disease with no significant bronchodilator response   Assessment & Plan:   History of bronchitis -Symptoms are stable at present  History of obstructive sleep apnea that was severe -Awaiting repeat home sleep study  Obstructive airway disease on pulmonary function test -Continue bronchodilator treatments  History of asthma -On tezspire, on Singulair 20 mg  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

## 2022-08-13 NOTE — Patient Instructions (Addendum)
Make sure you go ahead and schedule your sleep study  You are cleared to have your colonoscopy as long as you are not having any acute symptoms  Continue your medications for your asthma  Regular exercises will be very beneficial  I will see you back in about 3 months  Your GI doctor will have access to the notes today that would state that you are clear for your colonoscopy

## 2022-08-17 ENCOUNTER — Encounter (HOSPITAL_BASED_OUTPATIENT_CLINIC_OR_DEPARTMENT_OTHER): Payer: Self-pay

## 2022-08-17 ENCOUNTER — Other Ambulatory Visit: Payer: Self-pay

## 2022-08-17 ENCOUNTER — Emergency Department (HOSPITAL_BASED_OUTPATIENT_CLINIC_OR_DEPARTMENT_OTHER)
Admission: EM | Admit: 2022-08-17 | Discharge: 2022-08-17 | Disposition: A | Discharge status: Home / Self Care | Payer: Medicare HMO | Attending: Emergency Medicine | Admitting: Emergency Medicine

## 2022-08-17 ENCOUNTER — Emergency Department (HOSPITAL_BASED_OUTPATIENT_CLINIC_OR_DEPARTMENT_OTHER): Payer: Medicare HMO

## 2022-08-17 DIAGNOSIS — Z7951 Long term (current) use of inhaled steroids: Secondary | ICD-10-CM | POA: Diagnosis not present

## 2022-08-17 DIAGNOSIS — I10 Essential (primary) hypertension: Secondary | ICD-10-CM | POA: Diagnosis not present

## 2022-08-17 DIAGNOSIS — Z79899 Other long term (current) drug therapy: Secondary | ICD-10-CM | POA: Diagnosis not present

## 2022-08-17 DIAGNOSIS — J45909 Unspecified asthma, uncomplicated: Secondary | ICD-10-CM | POA: Insufficient documentation

## 2022-08-17 DIAGNOSIS — R519 Headache, unspecified: Secondary | ICD-10-CM | POA: Diagnosis not present

## 2022-08-17 DIAGNOSIS — J441 Chronic obstructive pulmonary disease with (acute) exacerbation: Secondary | ICD-10-CM | POA: Insufficient documentation

## 2022-08-17 DIAGNOSIS — R0602 Shortness of breath: Secondary | ICD-10-CM | POA: Diagnosis present

## 2022-08-17 DIAGNOSIS — Z20822 Contact with and (suspected) exposure to covid-19: Secondary | ICD-10-CM | POA: Insufficient documentation

## 2022-08-17 LAB — CBC WITH DIFFERENTIAL/PLATELET
Abs Immature Granulocytes: 0.02 10*3/uL (ref 0.00–0.07)
Basophils Absolute: 0 10*3/uL (ref 0.0–0.1)
Basophils Relative: 1 %
Eosinophils Absolute: 0.2 10*3/uL (ref 0.0–0.5)
Eosinophils Relative: 3 %
HCT: 36.5 % (ref 36.0–46.0)
Hemoglobin: 11.5 g/dL — ABNORMAL LOW (ref 12.0–15.0)
Immature Granulocytes: 0 %
Lymphocytes Relative: 39 %
Lymphs Abs: 2.8 10*3/uL (ref 0.7–4.0)
MCH: 26.2 pg (ref 26.0–34.0)
MCHC: 31.5 g/dL (ref 30.0–36.0)
MCV: 83.1 fL (ref 80.0–100.0)
Monocytes Absolute: 0.4 10*3/uL (ref 0.1–1.0)
Monocytes Relative: 6 %
Neutro Abs: 3.7 10*3/uL (ref 1.7–7.7)
Neutrophils Relative %: 51 %
Platelets: 327 10*3/uL (ref 150–400)
RBC: 4.39 MIL/uL (ref 3.87–5.11)
RDW: 15.3 % (ref 11.5–15.5)
WBC: 7.2 10*3/uL (ref 4.0–10.5)
nRBC: 0 % (ref 0.0–0.2)

## 2022-08-17 LAB — BASIC METABOLIC PANEL
Anion gap: 8 (ref 5–15)
BUN: 16 mg/dL (ref 8–23)
CO2: 27 mmol/L (ref 22–32)
Calcium: 10 mg/dL (ref 8.9–10.3)
Chloride: 107 mmol/L (ref 98–111)
Creatinine, Ser: 0.64 mg/dL (ref 0.44–1.00)
GFR, Estimated: >60 mL/min (ref >60)
Glucose, Bld: 108 mg/dL — ABNORMAL HIGH (ref 70–99)
Potassium: 3.6 mmol/L (ref 3.5–5.1)
Sodium: 142 mmol/L (ref 135–145)

## 2022-08-17 LAB — RESP PANEL BY RT-PCR (RSV, FLU A&B, COVID)  RVPGX2
Influenza A by PCR: NEGATIVE
Influenza B by PCR: NEGATIVE
Resp Syncytial Virus by PCR: NEGATIVE
SARS Coronavirus 2 by RT PCR: NEGATIVE

## 2022-08-17 LAB — TROPONIN I (HIGH SENSITIVITY): Troponin I (High Sensitivity): 13 ng/L (ref <18)

## 2022-08-17 MED ORDER — IPRATROPIUM-ALBUTEROL 0.5-2.5 (3) MG/3ML IN SOLN
3.0000 mL | Freq: Once | RESPIRATORY_TRACT | Status: AC
  Administered 2022-08-17: 3 mL via RESPIRATORY_TRACT
  Filled 2022-08-17: qty 30

## 2022-08-17 MED ORDER — KETOROLAC TROMETHAMINE 15 MG/ML IJ SOLN
15.0000 mg | Freq: Once | INTRAMUSCULAR | Status: AC
  Administered 2022-08-17: 15 mg via INTRAVENOUS
  Filled 2022-08-17: qty 1

## 2022-08-17 MED ORDER — DEXAMETHASONE SODIUM PHOSPHATE 10 MG/ML IJ SOLN
10.0000 mg | Freq: Once | INTRAMUSCULAR | Status: AC
  Administered 2022-08-17: 10 mg via INTRAVENOUS
  Filled 2022-08-17: qty 1

## 2022-08-17 NOTE — ED Notes (Signed)
RT placed PIV in right wrist, blood specimens collected at this time. PIV secured, flushed, saline locked.

## 2022-08-17 NOTE — ED Provider Notes (Signed)
[facility] Provider Note   CSN: [id number] Arrival date & time: 08/17/22  0330     History  Chief Complaint  Patient presents with   Shortness of Breath    Patty Howard is a 67 y.o. female.  HPI     This is a 67 year old female who presents with headache, body aches, shortness of breath.  She reports several day history of the symptoms.  She states that she feels like she has a knot on the back of her neck which radiates into her head causing headache.  She has no significant history of headaches or migraines.  She reports some shortness of breath and nonproductive cough.  No noted fevers.  She has been using her nebulizer at home with some relief.  Home Medications Prior to Admission medications   Medication Sig Start Date End Date Taking? Authorizing Provider  albuterol (PROVENTIL) (2.5 MG/3ML) 0.083% nebulizer solution Take 3 mLs (2.5 mg total) by nebulization every 6 (six) hours as needed for wheezing or shortness of breath. 08/03/22   Valentina Shaggy, MD  albuterol (VENTOLIN HFA) 108 (90 Base) MCG/ACT inhaler Inhale 1-2 puffs into the lungs every 6 (six) hours as needed for wheezing or shortness of breath. 08/03/22   Valentina Shaggy, MD  amLODipine (NORVASC) 5 MG tablet Take 1 tablet (5 mg total) by mouth daily. 06/18/22   Deberah Pelton, NP  ascorbic acid (VITAMIN C) 500 MG tablet Take 500 mg by mouth daily.    [provider]  atorvastatin (LIPITOR) 20 MG tablet Take 1 tablet (20 mg total) by mouth daily. 06/18/22   Deberah Pelton, NP  azithromycin (ZITHROMAX) 500 MG tablet Take 1 tablet (500 mg total) by mouth every Monday, Wednesday, and Friday. 07/31/22 10/29/22  Valentina Shaggy, MD  Budeson-Glycopyrrol-Formoterol (BREZTRI AEROSPHERE) 160-9-4.8 MCG/ACT AERO Inhale 2 puffs into the lungs in the morning and at bedtime. 07/30/22 10/28/22  Valentina Shaggy, MD  dexamethasone (DECADRON) 2 MG tablet  Take all 5 tablets on 07/05/2022. 07/04/22   Molpus, Jenny Reichmann, MD  furosemide (LASIX) 40 MG tablet Take 1 tablet (40 mg total) by mouth daily. 06/18/22   Deberah Pelton, NP  ibuprofen (ADVIL) 800 MG tablet Take 1 tablet (800 mg total) by mouth every 8 (eight) hours as needed for moderate pain. 02/17/22   de [place], Blondell Reveal, MD  ipratropium-albuterol (DUONEB) 0.5-2.5 (3) MG/3ML SOLN Take 3 mLs by nebulization every 6 (six) hours as needed. Patient taking differently: Take 3 mLs by nebulization every 6 (six) hours as needed (Wheezing, shortness of breath). 04/23/21   Clemon Chambers, MD  levocetirizine (XYZAL) 5 MG tablet Take 1 tablet (5 mg total) by mouth every evening. 08/03/22   Valentina Shaggy, MD  levothyroxine (SYNTHROID) 100 MCG tablet Take 100 mcg by mouth daily before breakfast.    [provider]  LINZESS 290 MCG CAPS capsule Take 290 mcg by mouth daily. 03/17/22   [provider]  losartan-hydrochlorothiazide (HYZAAR) 100-25 MG tablet Take 1 tablet by mouth daily. 10/21/21   Duke, Tami Lin, PA  Multiple Vitamin (MULTIVITAMIN WITH MINERALS) TABS tablet Take 1 tablet by mouth daily. Centrum Multivitamin    [provider]  pantoprazole (PROTONIX) 40 MG tablet Take 1 tablet (40 mg total) by mouth 2 (two) times daily. 07/23/22   Esterwood, Amy S, PA-C  potassium chloride SA (KLOR-CON M) 20 MEQ tablet Take 1 tablet (20 mEq total) by mouth daily. 09/30/21  06/18/22  Barb Merino, MD  Semaglutide, 1 MG/DOSE, 4 MG/3ML SOPN Inject 1 mg as directed once a week. Patient taking differently: Inject 1 mg into the skin once a week. 02/17/22   [place], Blondell Reveal, MD  traMADol (ULTRAM) 50 MG tablet Take 1 tablet (50 mg total) by mouth every 12 (twelve) hours as needed. 02/27/22   [name], MD  Vitamin D, Ergocalciferol, (DRISDOL) 1.25 MG (50000 UNIT) CAPS capsule Take 50,000 Units by mouth every Monday.  10/27/19   [provider]  zafirlukast (ACCOLATE) 20 MG tablet TAKE 1  TABLET (20 MG TOTAL) BY MOUTH 2 (TWO) TIMES DAILY BEFORE A MEAL. Patient taking differently: Take 20 mg by mouth 2 (two) times daily. 02/20/22 06/18/22  Valentina Shaggy, MD  zolpidem (AMBIEN CR) 6.25 MG CR tablet Take 1 tablet (6.25 mg total) by mouth at bedtime as needed for sleep. 04/29/22   Laurin Coder, MD      Allergies    Patient has no known allergies.    Review of Systems   Review of Systems  Constitutional:  Negative for fever.  Respiratory:  Positive for cough and shortness of breath.   Neurological:  Positive for headaches.  All other systems reviewed and are negative.   Physical Exam Updated Vital Signs BP (!) 174/100   Pulse 93   Temp 98.3 F (36.8 C)   Resp 19   Ht 1.753 m ('5\' 9"'$ )   Wt 127 kg   SpO2 97%   BMI 41.35 kg/m  Physical Exam Vitals and nursing note reviewed.  Constitutional:      Appearance: She is well-developed. She is obese. She is not ill-appearing.  HENT:     Head: Normocephalic and atraumatic.  Eyes:     Pupils: Pupils are equal, round, and reactive to light.  Neck:     Comments: No meningismus, no lymphadenopathy Cardiovascular:     Rate and Rhythm: Normal rate and regular rhythm.     Heart sounds: Normal heart sounds.  Pulmonary:     Effort: Pulmonary effort is normal. No respiratory distress.     Breath sounds: Wheezing present.     Comments: Fair air movement, faint expiratory wheezing in all lung fields Abdominal:     General: Bowel sounds are normal.     Palpations: Abdomen is soft.  Musculoskeletal:     Cervical back: Neck supple.  Skin:    General: Skin is warm and dry.  Neurological:     Mental Status: She is alert and oriented to person, place, and time.  Psychiatric:        Mood and Affect: Mood normal.     ED Results / Procedures / Treatments   Labs (all labs ordered are listed, but only abnormal results are displayed) Labs Reviewed  CBC WITH DIFFERENTIAL/PLATELET - Abnormal; Notable for the following  components:      Result Value   Hemoglobin 11.5 (*)    All other components within normal limits  BASIC METABOLIC PANEL - Abnormal; Notable for the following components:   Glucose, Bld 108 (*)    All other components within normal limits  RESP PANEL BY RT-PCR (RSV, FLU A&B, COVID)  RVPGX2  TROPONIN I (HIGH SENSITIVITY)  TROPONIN I (HIGH SENSITIVITY)    EKG EKG Interpretation  Date/Time:  Monday August 17 2022 03:43:38 EST Ventricular Rate:  98 PR Interval:  143 QRS Duration: 97 QT Interval:  383 QTC Calculation: 489 R Axis:   78  Text Interpretation: Sinus rhythm Atrial premature complex Minimal ST depression, inferior leads Borderline prolonged QT interval Confirmed by Thayer Jew [phone]) on 08/17/2022 4:29:35 AM  Radiology DG Chest Portable 1 View  Result Date: 08/17/2022 CLINICAL DATA:  Shortness of breath EXAM: PORTABLE CHEST 1 VIEW COMPARISON:  07/03/2022 FINDINGS: Stable borderline heart size and mediastinal contours. Chronic mild interstitial prominence with mild scarring by prior CT. No acute infiltrate or edema. No effusion or pneumothorax. No acute osseous findings. IMPRESSION: No acute finding or change from prior. Electronically Signed   By: Jorje Guild M.D.   On: 08/17/2022 04:34    Procedures Procedures    Medications Ordered in ED Medications  ipratropium-albuterol (DUONEB) 0.5-2.5 (3) MG/3ML nebulizer solution 3 mL (3 mLs Nebulization Given 08/17/22 0355)  ketorolac (TORADOL) 15 MG/ML injection 15 mg (15 mg Intravenous Given 08/17/22 0458)  dexamethasone (DECADRON) injection 10 mg (10 mg Intravenous Given 08/17/22 0522)    ED Course/ Medical Decision Making/ A&P                             Medical Decision Making Amount and/or Complexity of Data Reviewed Labs: ordered. Radiology: ordered.  Risk Prescription drug management.   This patient presents to the ED for concern of headache, shortness of breath, this involves an extensive number of treatment  options, and is a complaint that carries with it a high risk of complications and morbidity.  I considered the following differential and admission for this acute, potentially life threatening condition.  The differential diagnosis includes viral illness such as COVID or influenza, pneumonia, less likely meningitis  MDM:    This is a 67 year old female who presents with headache and shortness of breath.  Also reports some upper respiratory symptoms.  She is overall nontoxic and vital signs are notable for mild tachycardia.  She has fair air movement and wheezing in all lung fields.  She is given a DuoNeb and Decadron.  Regarding her headache, no signs or symptoms of meningitis.  Likely tension given pain originates in the neck.  Given Toradol.  Labs obtained and reviewed.  Troponin negative.  Basic lab work is reassuring.  COVID and influenza are negative.  Chest x-ray without pneumothorax or pneumonia.  EKG shows no evidence of acute arrhythmia or ischemia.  Patient improved on reassessment.  (Labs, imaging, consults)  Labs: I Ordered, and personally interpreted labs.  The pertinent results include: CBC, BMP, troponin, COVID, influenza  Imaging Studies ordered: I ordered imaging studies including chest x-ray without pneumothorax or pneumonia I independently visualized and interpreted imaging. I agree with the radiologist interpretation  Additional history obtained from chart review.  External records from outside source obtained and reviewed including prior evaluations  Cardiac Monitoring: The patient was maintained on a cardiac monitor.  If on the cardiac monitor, I personally viewed and interpreted the cardiac monitored which showed an underlying rhythm of: Sinus  Reevaluation: After the interventions noted above, I reevaluated the patient and found that they have :improved  Social Determinants of Health:  lives independently  Disposition: Discharge  Co morbidities that complicate the  patient evaluation  Past Medical History:  Diagnosis Date   Anxiety    Arthritis    Asthma    COPD (chronic obstructive pulmonary disease) ([place])    Fracture    closed displaced left radial head   GERD (gastroesophageal reflux disease)    Hypertension    Sleep apnea    does  not wear CPAP     Medicines Meds ordered this encounter  Medications   ipratropium-albuterol (DUONEB) 0.5-2.5 (3) MG/3ML nebulizer solution 3 mL   ketorolac (TORADOL) 15 MG/ML injection 15 mg   dexamethasone (DECADRON) injection 10 mg    I have reviewed the patients home medicines and have made adjustments as needed  Problem List / ED Course: Problem List Items Addressed This Visit       Respiratory   COPD exacerbation ([place]) - Primary   Other Visit Diagnoses     Acute nonintractable headache, unspecified headache type       Relevant Medications   ketorolac (TORADOL) 15 MG/ML injection 15 mg (Completed)                   Final Clinical Impression(s) / ED Diagnoses Final diagnoses:  COPD exacerbation ([place])  Acute nonintractable headache, unspecified headache type    Rx / DC Orders ED Discharge Orders     None         Merryl Hacker, MD 08/17/22 [phone]

## 2022-08-17 NOTE — Discharge Instructions (Signed)
You were seen today for some shortness of breath and headache.  Your workup is reassuring.  Take Tylenol or ibuprofen as needed for any ongoing pain.  You were given a dose of steroids.  Continue your nebulizers.  You should avoid smoking.

## 2022-08-17 NOTE — ED Triage Notes (Addendum)
POV from home, A&O x 4, GCS 15, amb to room  Pt c/o body aches, headache, SOB worse with exertion and cough x couple days. Hx of COPD, did 3 breathing tx at home without relief.

## 2022-08-18 ENCOUNTER — Encounter: Payer: PRIVATE HEALTH INSURANCE | Admitting: Gastroenterology

## 2022-08-18 DIAGNOSIS — D806 Antibody deficiency with near-normal immunoglobulins or with hyperimmunoglobulinemia: Secondary | ICD-10-CM | POA: Insufficient documentation

## 2022-08-18 DIAGNOSIS — E785 Hyperlipidemia, unspecified: Secondary | ICD-10-CM | POA: Insufficient documentation

## 2022-08-24 NOTE — Telephone Encounter (Signed)
Inbound call from patient f/u on being scheduled for hospital procedure. Please advise.  Thank you

## 2022-08-29 LAB — LAB REPORT - SCANNED
A1c: 6.1
EGFR: 95

## 2022-08-31 ENCOUNTER — Ambulatory Visit: Payer: Self-pay

## 2022-09-01 LAB — LAB REPORT - SCANNED: EGFR: 94

## 2022-09-02 ENCOUNTER — Encounter: Payer: Self-pay | Admitting: Pulmonary Disease

## 2022-09-02 ENCOUNTER — Telehealth: Payer: Self-pay | Admitting: *Deleted

## 2022-09-02 NOTE — Telephone Encounter (Signed)
Spoke to patient and she will bring new Ins by so I can get approval and sent to [employer]

## 2022-09-02 NOTE — Telephone Encounter (Signed)
Patient had called and l/m for me to reach out to her regarding her Tezspire. I did call and l/m for her to reach out to me she currently does not have active Ins to process same

## 2022-09-03 ENCOUNTER — Encounter: Payer: Self-pay | Admitting: Allergy & Immunology

## 2022-09-03 ENCOUNTER — Ambulatory Visit: Payer: Self-pay

## 2022-09-03 ENCOUNTER — Ambulatory Visit: Payer: Medicare HMO | Admitting: Allergy & Immunology

## 2022-09-03 ENCOUNTER — Other Ambulatory Visit: Payer: Self-pay

## 2022-09-03 VITALS — BP 138/80 | HR 95 | Temp 98.2°F | Resp 20 | Ht 69.0 in | Wt 273.0 lb

## 2022-09-03 DIAGNOSIS — J4550 Severe persistent asthma, uncomplicated: Secondary | ICD-10-CM

## 2022-09-03 DIAGNOSIS — D806 Antibody deficiency with near-normal immunoglobulins or with hyperimmunoglobulinemia: Secondary | ICD-10-CM

## 2022-09-03 DIAGNOSIS — J3089 Other allergic rhinitis: Secondary | ICD-10-CM | POA: Diagnosis not present

## 2022-09-03 DIAGNOSIS — K219 Gastro-esophageal reflux disease without esophagitis: Secondary | ICD-10-CM | POA: Diagnosis not present

## 2022-09-03 NOTE — Patient Instructions (Addendum)
1. Severe persistent asthma, uncomplicated - You sound SO GOOD today!  - I am glad that you are doing so well!  - We are not going to make any changes today. - Daily controller medication(s): Breztri two puffs twice daily with spacer + zafirlukast 20mg  twice daily + Tezspire every month  - Prior to physical activity: albuterol 2 puffs 10-15 minutes before physical activity. - Rescue medications: albuterol 4 puffs every 4-6 hours as needed, albuterol nebulizer one vial every 4-6 hours as needed or DuoNeb nebulizer one vial every 4-6 hours as needed - Asthma control goals:  * Full participation in all desired activities (may need albuterol before activity) * Albuterol use two time or less a week on average (not counting use with activity) * Cough interfering with sleep two time or less a month * Oral steroids no more than once a year * No hospitalizations  2. Chronic rhinitis (dust mites, molds) - Continue with Flonase one spray per nostril twice daily to  - Continue with Xyzal (levocetirizine) 5mg  daily to control any postnasal drip.   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 - This is likely going to be a lifetime treatment.  - I think we can hold off on immunoglobulin infusions at this point.   4. Gastroesophageal reflux disease  - Continue with Protonix 40mg  once daily to control any silent reflux.  5. Return in about 2 months (around 11/03/2022).    Please inform us of any Emergency Department visits, hospitalizations, or changes in symptoms. Call us before going to the ED for breathing or allergy symptoms since we might be able to fit you in for a sick visit. Feel free to contact us anytime with any questions, problems, or concerns.  It was a pleasure to see you again today!  Websites that have reliable patient information: 1. American Academy of Asthma, Allergy, and Immunology:  www.aaaai.org 2. Food Allergy Research and Education (FARE): foodallergy.org 3. Mothers of Asthmatics: http://www.asthmacommunitynetwork.org 4. American College of Allergy, Asthma, and Immunology: www.acaai.org   COVID-19 Vaccine Information can be found at: ShippingScam.co.uk For questions related to vaccine distribution or appointments, please email [email or web address] or call [phone].     "Like" [place] on Facebook and Instagram for our latest updates!        Make sure you are registered to vote! If you have moved or change d any of your contact information, you will need to get this updated before voting!  In some cases, you MAY be able to register to vote online: [email or web address]

## 2022-09-03 NOTE — Progress Notes (Signed)
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   Plan/Recommendations:   1. Severe persistent asthma, uncomplicated - You sound SO GOOD today!  - I am glad that you are doing so well!  - We are not going to make any changes today. - Daily controller medication(s): Breztri two puffs twice daily with spacer + zafirlukast 20mg  twice daily + Tezspire every month  - Prior to physical activity: albuterol 2 puffs 10-15 minutes before physical activity. - Rescue medications: albuterol 4 puffs every 4-6 hours as needed, albuterol nebulizer one vial every 4-6 hours as needed or DuoNeb nebulizer one vial every 4-6 hours as needed - Asthma control goals:  * Full participation in all desired activities (may need albuterol before activity) * Albuterol use two time or less a week on average (not counting use with activity) * Cough interfering with sleep two time or less a month * Oral steroids no more than once a year * No hospitalizations  2. Chronic rhinitis (dust mites, molds) - Continue with Flonase one spray per nostril twice daily to  - Continue with Xyzal (levocetirizine) 5mg  daily to control any postnasal drip.   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 - This is likely going to be a lifetime treatment.  - I think we can hold off on immunoglobulin infusions at this point.   4. Gastroesophageal reflux disease  - Continue with Protonix 40mg   once daily to control any silent reflux.  5. Return in about 2 months (around 11/03/2022).    Subjective:   Patty Howard is a 67 y.o. female presenting today for follow up of  Chief Complaint  Patient presents with   Follow-up    Pt states she is doing well her asthma is good and she is here to get her tezspire today.    Patty Howard has a history of the following: Patient Active Problem List   Diagnosis Date Noted   COPD exacerbation ([place]) 04/18/2022   Right hip pain 02/17/2022   Cough 12/05/2021   Type 2 diabetes mellitus without complication, without long-term current use of insulin ([place]) 11/05/2021   Trigger finger, left middle finger 10/02/2021   COPD with acute exacerbation ([place]) 09/28/2021   Acute exacerbation of COPD with asthma ([place]) 09/27/2021   Other intervertebral disc degeneration, lumbar region 07/15/2021   History of total hip arthroplasty, left 03/07/2021   Trochanteric bursitis, left hip 12/23/2020   Tracheobronchomalacia [id number]   Diastolic dysfunction [id number]   Asthma exacerbation 11/13/2020   Menopause 11/08/2020   Medication management 04/30/2020   Gastroesophageal reflux disease 03/14/2020   OSA (obstructive sleep apnea) 02/15/2020   Abnormal findings on diagnostic imaging of lung 01/08/2020   Educated about COVID-19 virus infection 11/30/2019   Aortic atherosclerosis ([place]) 11/30/2019   Chest pain 11/26/2019   Acute asthma exacerbation 05/22/2019   Left radial head fracture 02/03/2019   Closed fracture of radial head 01/29/2019   Asthma with COPD (chronic  obstructive pulmonary disease) 10/19/2018   Insomnia 10/19/2018   Elbow pain, chronic, left 10/19/2018   Dyspnea 10/12/2017   Pancolitis ([place]) 05/20/2017   H/O Spinal surgery 01/18/2015   Essential hypertension 01/18/2015   Obesity 01/18/2015    History obtained from: chart review and patient.  Patty Howard is a 67 y.o. female presenting for a follow up visit.  She was last  seen in February 2024.  At that time, we did not do lung testing.  We continue with Breztri 2 puffs twice daily as well as severely Test 40 mg twice daily.  We also continue with Tezspire every month.  For her rhinitis, we continue with Flonase and Xyzal.  For her recurrent infections, we started azithromycin 500 mg 3 times weekly as well as a probiotic.  GERD was controlled with Protonix once daily.  Since last visit, she as done well.   Asthma/Respiratory Symptom History: She is doing well with the Tezspire. She is on the [employer] and the [employer]. She has no complaints today and reports that she is feeling better than she has in a long time. She has not used her rescue inhaler in a few days. She will use it when she is shopping and walking around. She has been doing a lot more walking lately and has lost 15 pounds since starting this increase in physical activity. She denies nighttime coughing and wheezing. She has not been on prednisone and has not been to the ED for her symptoms. This is the best that she has ever felt.   She is still seeing Dr. Ander Slade and she is going to have another sleepy study. She might not need her CPAP, but time will tell. The sleep study has not been scheduled yet.   Chest CT (November 2023) IMPRESSION: 1. No evidence of fibrotic interstitial lung disease. 2. Mild, bland appearing bandlike scarring, consistent with sequelae of prior infection or inflammation. 3. Mild tubular bronchiectasis in the lower lungs. 4. Tracheobronchomalacia on expiratory phase imaging. No significant air trapping. 5. Multiple tiny pulmonary nodules, stable and benign, for which no further follow-up or characterization is required.  Allergic Rhinitis Symptom History: She is doing very well with the  fluticasone and the levocetirizine daily. She has not been on antibiotics aside from her prophylactic azithromycin since the last visit. She is breathing well overall through her sinuses and she  feels quite good.   She is doing azithromycin three times weekly. She feels great and she has lost some weight. She has not had any breakthrough sinus or ear infections or PNAs. She is doing very well and is up to date on her COVID vaccines.    GERD Symptom History: Reflux is controlled with the Protonix.  She does not need refills on these medications today.   Otherwise, there have been no changes to her past medical history, surgical history, family history, or social history.    Review of Systems  Constitutional:  Negative for fever, malaise/fatigue and weight loss.  HENT:  Negative for congestion, ear discharge, ear pain and sinus pain.   Eyes:  Negative for pain, discharge and redness.  Respiratory:  Negative for cough, sputum production, shortness of breath and wheezing.   Cardiovascular: Negative.  Negative for chest pain and palpitations.  Gastrointestinal:  Negative for abdominal pain, constipation, diarrhea, heartburn, nausea and vomiting.  Skin: Negative.  Negative for itching and rash.  Neurological:  Negative for dizziness and headaches.  Endo/Heme/Allergies:  Negative for environmental allergies. Does not bruise/bleed easily.  Objective:   Blood pressure 138/80, pulse 95, temperature 98.2 F (36.8 C), resp. rate 20, height 5\' 9"  (1.753 m), weight 273 lb (123.8 kg), SpO2 97 %. Body mass index is 40.32 kg/m.    Physical Exam Vitals reviewed.  Constitutional:      Appearance: She is well-developed.     Comments: Speaking in full sentences without a problem. Talkative and thankful.   HENT:     Head: Normocephalic and atraumatic.     Right Ear: Tympanic membrane, ear canal and external ear normal.     Left Ear: Tympanic membrane, ear canal and external ear normal.     Nose: No nasal deformity, septal deviation, mucosal edema or rhinorrhea.     Right Turbinates: Enlarged, swollen and pale.     Left Turbinates: Enlarged, swollen and pale.     Right Sinus: No  maxillary sinus tenderness or frontal sinus tenderness.     Left Sinus: No maxillary sinus tenderness or frontal sinus tenderness.     Mouth/Throat:     Lips: Pink. No lesions.     Mouth: Mucous membranes are moist. Mucous membranes are not pale and not dry.     Pharynx: Uvula midline.     Comments: Mild cobblestoning present in the posterior oropharynx. Eyes:     General: Lids are normal. No allergic shiner.       Right eye: No discharge.        Left eye: No discharge.     Conjunctiva/sclera: Conjunctivae normal.     Right eye: Right conjunctiva is not injected. No chemosis.    Left eye: Left conjunctiva is not injected. No chemosis.    Pupils: Pupils are equal, round, and reactive to light.  Cardiovascular:     Rate and Rhythm: Normal rate and regular rhythm.     Heart sounds: Normal heart sounds.  Pulmonary:     Effort: Pulmonary effort is normal. No tachypnea, accessory muscle usage or respiratory distress.     Breath sounds: Rhonchi present. No wheezing or rales.     Comments: Moving air well in all lung fields.  No increased work of breathing.   Chest:     Chest wall: No tenderness.  Lymphadenopathy:     Cervical: No cervical adenopathy.  Skin:    General: Skin is warm.     Capillary Refill: Capillary refill takes less than 2 seconds.     Coloration: Skin is not pale.     Findings: No abrasion, erythema, petechiae or rash. Rash is not papular, urticarial or vesicular.  Neurological:     Mental Status: She is alert.  Psychiatric:        Behavior: Behavior is cooperative.      Diagnostic studies: none       Salvatore Marvel, MD  [facility]

## 2022-09-16 ENCOUNTER — Telehealth: Payer: Self-pay | Admitting: *Deleted

## 2022-09-16 NOTE — Telephone Encounter (Signed)
See 2/29 office note from [facility], Patient cleared as long as she is not having any problems   Had to leave a message for patient to return call

## 2022-09-17 ENCOUNTER — Encounter: Payer: Self-pay | Admitting: *Deleted

## 2022-09-17 ENCOUNTER — Other Ambulatory Visit: Payer: Self-pay | Admitting: *Deleted

## 2022-09-17 DIAGNOSIS — R1310 Dysphagia, unspecified: Secondary | ICD-10-CM

## 2022-09-17 DIAGNOSIS — J4550 Severe persistent asthma, uncomplicated: Secondary | ICD-10-CM

## 2022-09-17 DIAGNOSIS — R09A2 Foreign body sensation, throat: Secondary | ICD-10-CM

## 2022-09-17 DIAGNOSIS — R195 Other fecal abnormalities: Secondary | ICD-10-CM

## 2022-09-17 DIAGNOSIS — K219 Gastro-esophageal reflux disease without esophagitis: Secondary | ICD-10-CM

## 2022-09-17 NOTE — Addendum Note (Signed)
Addended by: Oda Kilts on: 09/17/2022 04:52 PM   Modules accepted: Orders

## 2022-09-17 NOTE — Telephone Encounter (Signed)
Scheduled pt for colon/EGD on 5/28 at 10:30 am    Patient just had an office appointment on 07/23/2022 she does not need a previsit.   Will mail her instructions today.  I explained to her to call me with any questions. She already has suprep kit from last time

## 2022-09-17 NOTE — Telephone Encounter (Signed)
Called patient x2 Left messages

## 2022-09-29 ENCOUNTER — Ambulatory Visit: Payer: Medicare Other | Admitting: Allergy & Immunology

## 2022-10-01 ENCOUNTER — Ambulatory Visit: Payer: Medicare HMO | Admitting: Allergy & Immunology

## 2022-10-01 ENCOUNTER — Ambulatory Visit: Payer: Self-pay

## 2022-10-01 ENCOUNTER — Telehealth: Payer: Self-pay

## 2022-10-01 NOTE — Telephone Encounter (Signed)
I spoke to patient and advised I have sent her new Ins and approval to [facility] and can followup with them regarding her delivery since she had change of Ins and did not get info until 3/21

## 2022-10-01 NOTE — Telephone Encounter (Signed)
Patient called wanting to come in for her Tezspire injection, but she does not have on at the [place].   Patient would like to get a sample of the Tezspire if possible.   I did inform her she needed to come in to update her insurance information and she stated she did the last time she was seen which is a month ago.   I also provided her the number to call to set up tezspire delivery.

## 2022-10-01 NOTE — Telephone Encounter (Signed)
She missed an office visit appointment today.   Malachi Bonds, MD [facility]

## 2022-10-08 ENCOUNTER — Encounter: Payer: Self-pay | Admitting: Family Medicine

## 2022-10-08 ENCOUNTER — Ambulatory Visit (INDEPENDENT_AMBULATORY_CARE_PROVIDER_SITE_OTHER): Payer: Medicare HMO | Admitting: Family Medicine

## 2022-10-08 ENCOUNTER — Other Ambulatory Visit: Payer: Self-pay

## 2022-10-08 VITALS — BP 128/74 | HR 97 | Temp 97.6°F | Resp 18 | Ht 69.0 in | Wt 273.2 lb

## 2022-10-08 DIAGNOSIS — J4489 Other specified chronic obstructive pulmonary disease: Secondary | ICD-10-CM

## 2022-10-08 DIAGNOSIS — K219 Gastro-esophageal reflux disease without esophagitis: Secondary | ICD-10-CM | POA: Diagnosis not present

## 2022-10-08 DIAGNOSIS — J3089 Other allergic rhinitis: Secondary | ICD-10-CM

## 2022-10-08 DIAGNOSIS — J4550 Severe persistent asthma, uncomplicated: Secondary | ICD-10-CM

## 2022-10-08 DIAGNOSIS — D806 Antibody deficiency with near-normal immunoglobulins or with hyperimmunoglobulinemia: Secondary | ICD-10-CM

## 2022-10-08 HISTORY — DX: Other allergic rhinitis: J30.89

## 2022-10-08 HISTORY — DX: Severe persistent asthma, uncomplicated: J45.50

## 2022-10-08 HISTORY — DX: Antibody deficiency with near-normal immunoglobulins or with hyperimmunoglobulinemia: D80.6

## 2022-10-08 MED ORDER — PANTOPRAZOLE SODIUM 40 MG PO TBEC: 40.0000 mg | DELAYED_RELEASE_TABLET | Freq: Two times a day (BID) | ORAL | 2 refills | Status: DC

## 2022-10-08 MED ORDER — BREZTRI AEROSPHERE 160-9-4.8 MCG/ACT IN AERO: 2.0000 | INHALATION_SPRAY | Freq: Two times a day (BID) | RESPIRATORY_TRACT | 3 refills | Status: AC

## 2022-10-08 MED ORDER — AZITHROMYCIN 500 MG PO TABS: 500.0000 mg | ORAL_TABLET | ORAL | 1 refills | Status: DC

## 2022-10-08 MED ORDER — LEVOCETIRIZINE DIHYDROCHLORIDE 5 MG PO TABS: 5.0000 mg | ORAL_TABLET | Freq: Every evening | ORAL | 1 refills | Status: DC

## 2022-10-08 NOTE — Patient Instructions (Signed)
1. Severe persistent asthma, uncomplicated - Daily controller medication(s): Increase Breztri to two puffs twice daily with spacer + zafirlukast  twice daily + Tezspire every month  - Prior to physical activity: albuterol 2 puffs 10-15 minutes before physical activity. - Rescue medications: albuterol 4 puffs every 4-6 hours as needed, albuterol nebulizer one vial every 4-6 hours as needed or DuoNeb nebulizer one vial every 4-6 hours as needed - Asthma control goals:  * Full participation in all desired activities (may need albuterol before activity) * Albuterol use two time or less a week on average (not counting use with activity) * Cough interfering with sleep two time or less a month * Oral steroids no more than once a year * No hospitalizations Call [facility] to set up delivery of your Tezspire injection  2. Chronic rhinitis (dust mites, molds) - Continue with Flonase one spray per nostril twice daily to  - Continue with Xyzal (levocetirizine)  daily to control any postnasal drip.   3. 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 - This is likely going to be a lifetime treatment.  - I think we can hold off on immunoglobulin infusions at this point.   4. Gastroesophageal reflux disease  - Continue with Protonix  once daily to control any silent reflux.  5. Follow up in 1 month or sooner if needed

## 2022-10-08 NOTE — Progress Notes (Signed)
[address] [place] [address] Dept: [phone]  FOLLOW UP NOTE  Patient ID: Patty Howard, female    DOB: 08/02/1955  Age: 67 y.o. MRN: [id number] Date of Office Visit: 10/08/2022  Assessment  Chief Complaint: Asthma [name] Reapproval - )  HPI Patty Howard is a 67 year old female who presents to the clinic for follow-up visit.  She was last seen in this clinic on 09/03/2022 by Dr. Dellis Anes for evaluation of asthma COPD overlap syndrome (she received her first tezspire at that visit), allergic rhinitis, reflux, diastolic heart failure, obesity, specific antibody deficiency with memory B cell disorder on azithromycin 3 times a week.  Of note, she visited the emergency department on 09/26/2022 for COPD exacerbation where she received a steroid and Augmentin.  At today's visit, she reports her asthma has been moderately well-controlled with symptoms including occasional shortness of breath and dry cough.  She continues Breztri 2 puffs once a day and uses her albuterol via nebulizer at least once a day and occasionally twice a day.  She reports that she does this out of habit and for peace of mind rather than for relief of acute asthma symptoms.  She has previously received tezspire injection in our clinic on 09/03/2022.  According to chart review, she has been approved for tezspire and needs to contact her pharmacy for delivery arrangements.  We had a detailed discussion regarding how to contact [facility] to set up delivery.  She will call to make an appointment for injection when delivery is scheduled.  Allergic rhinitis is reported as moderately well-controlled with symptoms including occasional nasal congestion and clear rhinorrhea.  She is not currently taking an antihistamine, using a nasal steroid spray, or using a nasal saline rinse.  Her last environmental allergy testing via lab was on 11/15/2018 and was positive to dust mite and mold.  She reports that she has received  Augmentin within the last 2 weeks for COPD exacerbation.  She reports that she did take azithromycin 3 days a week for about 1 month and then ran out of azithromycin.  Reflux is reported as well-controlled with no symptoms including heart burn or vomiting.  She continues pantoprazole 40 mg once a day.  She reports that she does take this medication 30 minutes before her first meal.  Her current medications are listed in the chart.   Drug Allergies:  No Known Allergies  Physical Exam: BP 128/74 (BP Location: Right Arm, Patient Position: Sitting, Cuff Size: Large)   Pulse 97   Temp 97.6 F (36.4 C) (Temporal)   Resp 18   Ht  (1.753 m)   Wt 273 lb 3.2 oz (123.9 kg)   SpO2 97%   BMI 40.34 kg/m    Physical Exam Vitals reviewed.  Constitutional:      Appearance: Normal appearance.  HENT:     Head: Normocephalic and atraumatic.     Right Ear: Tympanic membrane normal.     Left Ear: Tympanic membrane normal.     Nose:     Comments: Bilateral naris edematous and pale with clear nasal drainage noted.  Pharynx normal.  Ears normal.  Eyes normal.    Mouth/Throat:     Pharynx: Oropharynx is clear.  Eyes:     Conjunctiva/sclera: Conjunctivae normal.  Cardiovascular:     Rate and Rhythm: Normal rate and regular rhythm.     Heart sounds: Normal heart sounds. No murmur heard. Pulmonary:     Effort: Pulmonary effort is normal.  Comments: Slight bilateral expiratory wheeze.  Scattered rhonchi which cleared with cough. Musculoskeletal:        General: Normal range of motion.     Cervical back: Normal range of motion and neck supple.  Skin:    General: Skin is warm and dry.  Neurological:     Mental Status: She is alert and oriented to person, place, and time.  Psychiatric:        Mood and Affect: Mood normal.        Behavior: Behavior normal.        Thought Content: Thought content normal.        Judgment: Judgment normal.     Diagnostics: FVC 1.76 which is 58% of  predicted value, FEV1 1.16 which is 49% of predicted value.  Spirometry indicates possible restriction.  This is consistent with previous spirometry readings.  Assessment and Plan: 1. Severe persistent asthma without complication   2. Gastroesophageal reflux disease, unspecified whether esophagitis present   3. Specific antibody deficiency with normal immunoglobulin concentration and normal number of B lymphocytes   4. 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)   5. Perennial allergic rhinitis     Meds ordered this encounter  Medications   Budeson-Glycopyrrol-Formoterol (BREZTRI AEROSPHERE) 160-9-4.8 MCG/ACT AERO    Sig: Inhale 2 puffs into the lungs in the morning and at bedtime.    Dispense:  3 each    Refill:  3   levocetirizine (XYZAL) 5 MG tablet    Sig: Take 1 tablet (5 mg total) by mouth every evening.    Dispense:  90 tablet    Refill:  1    30 day courtesy refill patient must keep appointment for further refills.   pantoprazole (PROTONIX) 40 MG tablet    Sig: Take 1 tablet (40 mg total) by mouth 2 (two) times daily.    Dispense:  60 tablet    Refill:  2   azithromycin (ZITHROMAX) 500 MG tablet    Sig: Take 1 tablet (500 mg total) by mouth every Monday, Wednesday, and Friday.    Dispense:  36 tablet    Refill:  1    Patient Instructions  1. Severe persistent asthma, uncomplicated - Daily controller medication(s): Increase Breztri to two puffs twice daily with spacer + zafirlukast  twice daily + Tezspire every month  - Prior to physical activity: albuterol 2 puffs 10-15 minutes before physical activity. - Rescue medications: albuterol 4 puffs every 4-6 hours as needed, albuterol nebulizer one vial every 4-6 hours as needed or DuoNeb nebulizer one vial every 4-6 hours as needed - Asthma control goals:  * Full participation in all desired activities (may need albuterol before activity) * Albuterol use two time or less a week on average (not counting use  with activity) * Cough interfering with sleep two time or less a month * Oral steroids no more than once a year * No hospitalizations Call [facility] to set up delivery of your Tezspire injection  2. Chronic rhinitis (dust mites, molds) - Continue with Flonase one spray per nostril twice daily to  - Continue with Xyzal (levocetirizine)  daily to control any postnasal drip.   3. 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 - This is likely going to be a lifetime treatment.  - I think we can hold off on immunoglobulin infusions at this point.   4. Gastroesophageal reflux disease  - Continue with  Protonix 40mg  once daily to control any silent reflux.  5. Follow up in 1 month or sooner if needed    Return in about 4 weeks (around 11/05/2022), or if symptoms worsen or fail to improve.    Thank you for the opportunity to care for this patient.  Please do not hesitate to contact me with questions.  Thermon Leyland, FNP [facility]

## 2022-10-14 ENCOUNTER — Ambulatory Visit: Payer: Medicare HMO

## 2022-10-16 ENCOUNTER — Ambulatory Visit (INDEPENDENT_AMBULATORY_CARE_PROVIDER_SITE_OTHER): Payer: Medicare HMO

## 2022-10-16 DIAGNOSIS — J4550 Severe persistent asthma, uncomplicated: Secondary | ICD-10-CM

## 2022-11-02 ENCOUNTER — Encounter (HOSPITAL_COMMUNITY): Payer: Self-pay | Admitting: Gastroenterology

## 2022-11-03 ENCOUNTER — Encounter: Payer: Self-pay | Admitting: Gastroenterology

## 2022-11-08 NOTE — Anesthesia Preprocedure Evaluation (Signed)
Anesthesia Evaluation  Patient identified by MRN, date of birth, ID band Patient awake    Reviewed: Allergy & Precautions, NPO status , Patient's Chart, lab work & pertinent test results  History of Anesthesia Complications Negative for: history of anesthetic complications  Airway Mallampati: III  TM Distance: >3 FB Neck ROM: Full    Dental  (+) Dental Advisory Given   Pulmonary shortness of breath, asthma , sleep apnea , COPD (exacerbation 09/26/2022),  COPD inhaler, neg recent URI, former smoker    + wheezing      Cardiovascular hypertension (amlodipine, furosemide, losartan-HCTZ), Pt. on medications (-) angina (-) Past MI, (-) Cardiac Stents and (-) CABG (-) dysrhythmias  Rhythm:Regular Rate:Normal  HLD  TTE 11/11/2021: IMPRESSIONS     1. Left ventricular ejection fraction, by estimation, is 60 to 65%. The  left ventricle has normal function. The left ventricle has no regional  wall motion abnormalities. There is moderate concentric left ventricular  hypertrophy. Left ventricular  diastolic parameters are consistent with Grade I diastolic dysfunction  (impaired relaxation).   2. Right ventricular systolic function is normal. The right ventricular  size is normal. There is normal pulmonary artery systolic pressure.   3. Left atrial size was mildly dilated.   4. The mitral valve is normal in structure. No evidence of mitral valve  regurgitation. No evidence of mitral stenosis.   5. The aortic valve is tricuspid. Aortic valve regurgitation is not  visualized. No aortic stenosis is present.   6. The inferior vena cava is normal in size with greater than 50%  respiratory variability, suggesting right atrial pressure of 3 mmHg.    Low-risk stress test 12/19/2019    Neuro/Psych neg Seizures PSYCHIATRIC DISORDERS Anxiety     negative neurological ROS     GI/Hepatic Neg liver ROS, Bowel prep,GERD  Medicated,,S/p Roux-en-Y  gastric bypass   Endo/Other  diabetes, Type 2Hypothyroidism    Renal/GU negative Renal ROS     Musculoskeletal  (+) Arthritis ,    Abdominal  (+) + obese  Peds  Hematology negative hematology ROS (+)   Anesthesia Other Findings Last semiglutide: 11/02/2022  Did a nebulizer treatment this morning at 8:00 am. Used her inhaler in preop.  Reproductive/Obstetrics                              Anesthesia Physical Anesthesia Plan  ASA: 3  Anesthesia Plan: MAC   Post-op Pain Management:    Induction: Intravenous  PONV Risk Score and Plan: 2 and Propofol infusion and Treatment may vary due to age or medical condition  Airway Management Planned: Natural Airway and Nasal Cannula  Additional Equipment:   Intra-op Plan:   Post-operative Plan:   Informed Consent: I have reviewed the patients History and Physical, chart, labs and discussed the procedure including the risks, benefits and alternatives for the proposed anesthesia with the patient or authorized representative who has indicated his/her understanding and acceptance.     Dental advisory given  Plan Discussed with: Anesthesiologist and CRNA  Anesthesia Plan Comments: (Discussed with patient risks of MAC including, but not limited to, minor pain or discomfort, hearing people in the room, and possible need for backup general anesthesia. Risks for general anesthesia also discussed including, but not limited to, sore throat, hoarse voice, chipped/damaged teeth, injury to vocal cords, nausea and vomiting, allergic reactions, lung infection, heart attack, stroke, and death. All questions answered. )  Anesthesia Quick Evaluation

## 2022-11-10 ENCOUNTER — Encounter (HOSPITAL_COMMUNITY): Payer: Self-pay | Admitting: Gastroenterology

## 2022-11-10 ENCOUNTER — Ambulatory Visit (HOSPITAL_COMMUNITY): Payer: Medicare HMO | Admitting: Anesthesiology

## 2022-11-10 ENCOUNTER — Ambulatory Visit (HOSPITAL_BASED_OUTPATIENT_CLINIC_OR_DEPARTMENT_OTHER): Payer: Medicare HMO | Admitting: Anesthesiology

## 2022-11-10 ENCOUNTER — Encounter (HOSPITAL_COMMUNITY)
Admission: RE | Disposition: A | Discharge status: Home / Self Care | Payer: Self-pay | Source: Home / Self Care | Attending: Gastroenterology

## 2022-11-10 ENCOUNTER — Ambulatory Visit (HOSPITAL_COMMUNITY)
Admission: RE | Admit: 2022-11-10 | Discharge: 2022-11-10 | Disposition: A | Discharge status: Home / Self Care | Payer: Medicare HMO | Attending: Gastroenterology | Admitting: Gastroenterology

## 2022-11-10 ENCOUNTER — Other Ambulatory Visit: Payer: Self-pay

## 2022-11-10 DIAGNOSIS — K635 Polyp of colon: Secondary | ICD-10-CM

## 2022-11-10 DIAGNOSIS — K2970 Gastritis, unspecified, without bleeding: Secondary | ICD-10-CM

## 2022-11-10 DIAGNOSIS — R1310 Dysphagia, unspecified: Secondary | ICD-10-CM | POA: Insufficient documentation

## 2022-11-10 DIAGNOSIS — D124 Benign neoplasm of descending colon: Secondary | ICD-10-CM | POA: Diagnosis not present

## 2022-11-10 DIAGNOSIS — Z79890 Hormone replacement therapy (postmenopausal): Secondary | ICD-10-CM | POA: Insufficient documentation

## 2022-11-10 DIAGNOSIS — K6389 Other specified diseases of intestine: Secondary | ICD-10-CM

## 2022-11-10 DIAGNOSIS — K2950 Unspecified chronic gastritis without bleeding: Secondary | ICD-10-CM

## 2022-11-10 DIAGNOSIS — I10 Essential (primary) hypertension: Secondary | ICD-10-CM | POA: Insufficient documentation

## 2022-11-10 DIAGNOSIS — K219 Gastro-esophageal reflux disease without esophagitis: Secondary | ICD-10-CM | POA: Insufficient documentation

## 2022-11-10 DIAGNOSIS — E785 Hyperlipidemia, unspecified: Secondary | ICD-10-CM | POA: Diagnosis not present

## 2022-11-10 DIAGNOSIS — Z7985 Long-term (current) use of injectable non-insulin antidiabetic drugs: Secondary | ICD-10-CM | POA: Insufficient documentation

## 2022-11-10 DIAGNOSIS — E039 Hypothyroidism, unspecified: Secondary | ICD-10-CM | POA: Insufficient documentation

## 2022-11-10 DIAGNOSIS — M1990 Unspecified osteoarthritis, unspecified site: Secondary | ICD-10-CM | POA: Diagnosis not present

## 2022-11-10 DIAGNOSIS — K648 Other hemorrhoids: Secondary | ICD-10-CM

## 2022-11-10 DIAGNOSIS — E119 Type 2 diabetes mellitus without complications: Secondary | ICD-10-CM | POA: Insufficient documentation

## 2022-11-10 DIAGNOSIS — R09A2 Foreign body sensation, throat: Secondary | ICD-10-CM

## 2022-11-10 DIAGNOSIS — Z7951 Long term (current) use of inhaled steroids: Secondary | ICD-10-CM | POA: Diagnosis not present

## 2022-11-10 DIAGNOSIS — Z9884 Bariatric surgery status: Secondary | ICD-10-CM | POA: Insufficient documentation

## 2022-11-10 DIAGNOSIS — Z87891 Personal history of nicotine dependence: Secondary | ICD-10-CM | POA: Insufficient documentation

## 2022-11-10 DIAGNOSIS — K5730 Diverticulosis of large intestine without perforation or abscess without bleeding: Secondary | ICD-10-CM

## 2022-11-10 DIAGNOSIS — J449 Chronic obstructive pulmonary disease, unspecified: Secondary | ICD-10-CM | POA: Diagnosis not present

## 2022-11-10 DIAGNOSIS — R195 Other fecal abnormalities: Secondary | ICD-10-CM

## 2022-11-10 DIAGNOSIS — Z79899 Other long term (current) drug therapy: Secondary | ICD-10-CM | POA: Diagnosis not present

## 2022-11-10 DIAGNOSIS — K644 Residual hemorrhoidal skin tags: Secondary | ICD-10-CM | POA: Diagnosis not present

## 2022-11-10 DIAGNOSIS — G4730 Sleep apnea, unspecified: Secondary | ICD-10-CM | POA: Diagnosis not present

## 2022-11-10 DIAGNOSIS — B9681 Helicobacter pylori [H. pylori] as the cause of diseases classified elsewhere: Secondary | ICD-10-CM | POA: Diagnosis not present

## 2022-11-10 DIAGNOSIS — Z9189 Other specified personal risk factors, not elsewhere classified: Secondary | ICD-10-CM | POA: Insufficient documentation

## 2022-11-10 DIAGNOSIS — J4550 Severe persistent asthma, uncomplicated: Secondary | ICD-10-CM

## 2022-11-10 HISTORY — PX: ESOPHAGOGASTRODUODENOSCOPY (EGD) WITH PROPOFOL: SHX5813

## 2022-11-10 HISTORY — PX: POLYPECTOMY: SHX5525

## 2022-11-10 HISTORY — DX: Foreign body sensation, throat: R09.A2

## 2022-11-10 HISTORY — PX: COLONOSCOPY WITH PROPOFOL: SHX5780

## 2022-11-10 HISTORY — DX: Other fecal abnormalities: R19.5

## 2022-11-10 HISTORY — DX: Polyp of colon: K63.5

## 2022-11-10 HISTORY — PX: BIOPSY: SHX5522

## 2022-11-10 SURGERY — ESOPHAGOGASTRODUODENOSCOPY (EGD) WITH PROPOFOL
Anesthesia: Monitor Anesthesia Care

## 2022-11-10 MED ORDER — PROPOFOL 500 MG/50ML IV EMUL
INTRAVENOUS | Status: DC | PRN
  Administered 2022-11-10: 125 ug/kg/min via INTRAVENOUS

## 2022-11-10 MED ORDER — PROPOFOL 1000 MG/100ML IV EMUL
INTRAVENOUS | Status: AC
  Filled 2022-11-10: qty 100

## 2022-11-10 MED ORDER — LIDOCAINE 2% (20 MG/ML) 5 ML SYRINGE
INTRAMUSCULAR | Status: DC | PRN
  Administered 2022-11-10: 60 mg via INTRAVENOUS
  Administered 2022-11-10: 40 mg via INTRAVENOUS

## 2022-11-10 MED ORDER — LACTATED RINGERS IV SOLN
INTRAVENOUS | Status: DC
  Administered 2022-11-10: 1000 mL via INTRAVENOUS

## 2022-11-10 MED ORDER — PROPOFOL 10 MG/ML IV BOLUS
INTRAVENOUS | Status: DC | PRN
  Administered 2022-11-10: 20 mg via INTRAVENOUS
  Administered 2022-11-10 (×2): 10 mg via INTRAVENOUS
  Administered 2022-11-10: 30 mg via INTRAVENOUS

## 2022-11-10 MED ORDER — SODIUM CHLORIDE 0.9 % IV SOLN: INTRAVENOUS | Status: DC

## 2022-11-10 SURGICAL SUPPLY — 25 items

## 2022-11-10 NOTE — Op Note (Signed)
[facility] Patient Name: Patty Howard Procedure Date: 11/10/2022 MRN: [id number] Attending MD: Napoleon Form , MD, [id number] Date of Birth: [birth year] CSN: [id number] Age: 67 Admit Type: Outpatient Procedure:                Upper GI endoscopy Indications:              Dysphagia, Heme positive stool Providers:                Napoleon Form, MD, Fransisca Connors, Marja Kays, Technician Referring MD:              Medicines:                Monitored Anesthesia Care Complications:            No immediate complications. Estimated Blood Loss:     Estimated blood loss was minimal. Procedure:                Pre-Anesthesia Assessment:                           - Prior to the procedure, a History and Physical                            was performed, and patient medications and                            allergies were reviewed. The patient's tolerance of                            previous anesthesia was also reviewed. The risks                            and benefits of the procedure and the sedation                            options and risks were discussed with the patient.                            All questions were answered, and informed consent                            was obtained. Prior Anticoagulants: The patient has                            taken no anticoagulant or antiplatelet agents. ASA                            Grade Assessment: III - A patient with severe                            systemic disease. After reviewing the risks and  benefits, the patient was deemed in satisfactory                            condition to undergo the procedure.                           After obtaining informed consent, the endoscope was                            passed under direct vision. Throughout the                            procedure, the patient's blood pressure, pulse, and                             oxygen saturations were monitored continuously. The                            [id number]-[id number] ([id number]) Olympus endoscope was introduced                            through the mouth, and advanced to the second part                            of duodenum. The upper GI endoscopy was                            accomplished without difficulty. The patient                            tolerated the procedure well. Scope In: Scope Out: Findings:      The Z-line was regular and was found 40 cm from the incisors.      No gross lesions were noted in the entire esophagus.      The gastroesophageal flap valve was visualized endoscopically and       classified as Hill Grade II (fold present, opens with respiration).      Patchy mild inflammation characterized by congestion (edema), erosions,       erythema and friability was found in the entire examined stomach.       Biopsies were taken with a cold forceps for Helicobacter pylori testing.      The cardia and gastric fundus were normal on retroflexion.      The examined duodenum was normal. Impression:               - Z-line regular, 40 cm from the incisors.                           - No gross lesions in the entire esophagus.                           - Gastroesophageal flap valve classified as Hill                            Grade II (fold present, opens with respiration).                           -  Gastritis. Biopsied.                           - Normal examined duodenum. Moderate Sedation:      N/A Recommendation:           - Patient has a contact number available for                            emergencies. The signs and symptoms of potential                            delayed complications were discussed with the                            patient. Return to normal activities tomorrow.                            Written discharge instructions were provided to the                            patient.                           - Resume  previous diet.                           - Continue present medications.                           - Await pathology results. Procedure Code(s):        --- Professional ---                           [phone], Esophagogastroduodenoscopy, flexible,                            transoral; with biopsy, single or multiple Diagnosis Code(s):        --- Professional ---                           K29.70, Gastritis, unspecified, without bleeding                           R13.10, Dysphagia, unspecified                           R19.5, Other fecal abnormalities CPT copyright 2022 American Medical Association. All rights reserved. The codes documented in this report are preliminary and upon coder review may  be revised to meet current compliance requirements. Napoleon Form, MD 11/10/2022 11:33:29 AM This report has been signed electronically. Number of Addenda: 0

## 2022-11-10 NOTE — H&P (Signed)
[facility] History and Physical   Primary Care Physician:  Melvenia Beam, MD   Reason for Procedure:  Heme positive stool, dysphagia, globus sensation  Plan:    EGD and colonoscopy with possible interventions as needed     HPI: Patty Howard is a very pleasant 67 y.o. female here for EGD and colonoscopy for evaluation of dysphagia, globus sensation and heme positive stool. H/o of severe asthma and COPD Denies any nausea, vomiting, abdominal pain, melena or bright red blood per rectum  The risks and benefits as well as alternatives of endoscopic procedure(s) have been discussed and reviewed. All questions answered. The patient agrees to proceed.    Past Medical History:  Diagnosis Date   Anxiety    Arthritis    Asthma    COPD (chronic obstructive pulmonary disease) (HCC)    Fracture    closed displaced left radial head   GERD (gastroesophageal reflux disease)    Hypertension    Sleep apnea    does not wear CPAP    Past Surgical History:  Procedure Laterality Date   BACK SURGERY     spinal stimulator   BREAST BIOPSY Right 2017   benign   COLONOSCOPY W/ BIOPSIES AND POLYPECTOMY     DILATION AND CURETTAGE OF UTERUS     LAPAROSCOPIC ROUX-EN-Y GASTRIC BYPASS WITH UPPER ENDOSCOPY AND REMOVAL OF LAP BAND     RADIAL HEAD ARTHROPLASTY Left 02/03/2019   Procedure: LEFT RADIAL HEAD ARTHROPLASTY;  Surgeon: Eldred Manges, MD;  Location: [facility] OR;  Service: Orthopedics;  Laterality: Left;  AXILLARY BLOCK VS BIER BLOCK   TOTAL HIP ARTHROPLASTY Left 01/22/2021   Procedure: LEFT TOTAL HIP ARTHROPLASTY ANTERIOR APPROACH;  Surgeon: Eldred Manges, MD;  Location: [facility] OR;  Service: Orthopedics;  Laterality: Left;   TUBAL LIGATION      Prior to Admission medications   Medication Sig Start Date End Date Taking? Authorizing Provider  albuterol (PROVENTIL) (2.5 MG/3ML) 0.083% nebulizer solution Take 3 mLs (2.5 mg total) by nebulization every 6 (six) hours as needed  for wheezing or shortness of breath. 08/03/22   Alfonse Spruce, MD  albuterol (VENTOLIN HFA) 108 (90 Base) MCG/ACT inhaler Inhale 1-2 puffs into the lungs every 6 (six) hours as needed for wheezing or shortness of breath. 08/03/22   Alfonse Spruce, MD  amLODipine (NORVASC) 5 MG tablet Take 1 tablet (5 mg total) by mouth daily. 06/18/22   Ronney Asters, NP  ascorbic acid (VITAMIN C) 500 MG tablet Take 500 mg by mouth daily.    [provider]  atorvastatin (LIPITOR) 20 MG tablet Take 1 tablet (20 mg total) by mouth daily. 06/18/22   Ronney Asters, NP  azithromycin (ZITHROMAX) 500 MG tablet Take 1 tablet (500 mg total) by mouth every Monday, Wednesday, and Friday. 10/09/22   Hetty Blend, FNP  Budeson-Glycopyrrol-Formoterol (BREZTRI AEROSPHERE) 160-9-4.8 MCG/ACT AERO Inhale 2 puffs into the lungs in the morning and at bedtime. 10/08/22 01/06/23  Hetty Blend, FNP  furosemide (LASIX) 40 MG tablet Take 1 tablet (40 mg total) by mouth daily. 06/18/22   Ronney Asters, NP  ibuprofen (ADVIL) 800 MG tablet Take 1 tablet (800 mg total) by mouth every 8 (eight) hours as needed for moderate pain. 02/17/22   de [place], Buren Kos, MD  ipratropium-albuterol (DUONEB) 0.5-2.5 (3) MG/3ML SOLN Take 3 mLs by nebulization every 6 (six) hours as needed. Patient taking differently: Take 3 mLs by nebulization every 6 (six) hours  as needed (Wheezing, shortness of breath). 04/23/21   Verlee Monte, MD  levocetirizine (XYZAL) 5 MG tablet Take 1 tablet (5 mg total) by mouth every evening. 10/08/22   Hetty Blend, FNP  levothyroxine (SYNTHROID) 100 MCG tablet Take 100 mcg by mouth daily before breakfast.    [provider]  LINZESS 290 MCG CAPS capsule Take 290 mcg by mouth daily. 03/17/22   [provider]  losartan-hydrochlorothiazide (HYZAAR) 100-25 MG tablet Take 1 tablet by mouth daily. 10/21/21   [facility], Roe Rutherford, PA  Multiple Vitamin (MULTIVITAMIN WITH MINERALS) TABS tablet Take 1 tablet  by mouth daily. Centrum Multivitamin    [provider]  pantoprazole (PROTONIX) 40 MG tablet Take 1 tablet (40 mg total) by mouth 2 (two) times daily. 10/08/22   Hetty Blend, FNP  potassium chloride SA (KLOR-CON M) 20 MEQ tablet Take 1 tablet (20 mEq total) by mouth daily. 09/30/21 06/18/22  Dorcas Carrow, MD  Semaglutide, 1 MG/DOSE, 4 MG/3ML SOPN Inject 1 mg as directed once a week. Patient taking differently: Inject 1 mg into the skin once a week. 02/17/22   [place], Buren Kos, MD  traMADol (ULTRAM) 50 MG tablet Take 1 tablet (50 mg total) by mouth every 12 (twelve) hours as needed. 02/27/22   Eldred Manges, MD  Vitamin D, Ergocalciferol, (DRISDOL) 1.25 MG (50000 UNIT) CAPS capsule Take 50,000 Units by mouth every Monday.  10/27/19   [provider]  zafirlukast (ACCOLATE) 20 MG tablet TAKE 1 TABLET (20 MG TOTAL) BY MOUTH 2 (TWO) TIMES DAILY BEFORE A MEAL. Patient taking differently: Take 20 mg by mouth 2 (two) times daily. 02/20/22 06/18/22  Alfonse Spruce, MD  zolpidem (AMBIEN CR) 6.25 MG CR tablet Take 1 tablet (6.25 mg total) by mouth at bedtime as needed for sleep. 04/29/22   Tomma Lightning, MD    Current Facility-Administered Medications  Medication Dose Route Frequency Provider Last Rate Last Admin   tezepelumab-ekko (TEZSPIRE) 210 MG/1. syringe 210 mg  210 mg Subcutaneous Q28 days Alfonse Spruce, MD   210 mg at 10/16/22 1550   Current Outpatient Medications  Medication Sig Dispense Refill   albuterol (PROVENTIL) (2.5 MG/3ML) 0.083% nebulizer solution Take 3 mLs (2.5 mg total) by nebulization every 6 (six) hours as needed for wheezing or shortness of breath. 75 mL 1   albuterol (VENTOLIN HFA) 108 (90 Base) MCG/ACT inhaler Inhale 1-2 puffs into the lungs every 6 (six) hours as needed for wheezing or shortness of breath. 8 g 4   amLODipine (NORVASC) 5 MG tablet Take 1 tablet (5 mg total) by mouth daily. 90 tablet 3   ascorbic acid (VITAMIN C) 500 MG tablet  Take 500 mg by mouth daily.     atorvastatin (LIPITOR) 20 MG tablet Take 1 tablet (20 mg total) by mouth daily. 90 tablet 2   azithromycin (ZITHROMAX) 500 MG tablet Take 1 tablet (500 mg total) by mouth every Monday, Wednesday, and Friday. 36 tablet 1   Budeson-Glycopyrrol-Formoterol (BREZTRI AEROSPHERE) 160-9-4.8 MCG/ACT AERO Inhale 2 puffs into the lungs in the morning and at bedtime. 3 each 3   furosemide (LASIX) 40 MG tablet Take 1 tablet (40 mg total) by mouth daily. 30 tablet 9   ibuprofen (ADVIL) 800 MG tablet Take 1 tablet (800 mg total) by mouth every 8 (eight) hours as needed for moderate pain. 30 tablet 0   ipratropium-albuterol (DUONEB) 0.5-2.5 (3) MG/3ML SOLN Take 3 mLs by nebulization every 6 (six)  hours as needed. (Patient taking differently: Take 3 mLs by nebulization every 6 (six) hours as needed (Wheezing, shortness of breath).) 150 mL 2   levocetirizine (XYZAL) 5 MG tablet Take 1 tablet (5 mg total) by mouth every evening. 90 tablet 1   levothyroxine (SYNTHROID) 100 MCG tablet Take 100 mcg by mouth daily before breakfast.     LINZESS 290 MCG CAPS capsule Take 290 mcg by mouth daily.     losartan-hydrochlorothiazide (HYZAAR) 100-25 MG tablet Take 1 tablet by mouth daily. 90 tablet 3   Multiple Vitamin (MULTIVITAMIN WITH MINERALS) TABS tablet Take 1 tablet by mouth daily. Centrum Multivitamin     pantoprazole (PROTONIX) 40 MG tablet Take 1 tablet (40 mg total) by mouth 2 (two) times daily. 60 tablet 2   potassium chloride SA (KLOR-CON M) 20 MEQ tablet Take 1 tablet (20 mEq total) by mouth daily. 30 tablet 0   Semaglutide, 1 MG/DOSE, 4 MG/3ML SOPN Inject 1 mg as directed once a week. (Patient taking differently: Inject 1 mg into the skin once a week.) 3 mL 2   traMADol (ULTRAM) 50 MG tablet Take 1 tablet (50 mg total) by mouth every 12 (twelve) hours as needed. 30 tablet 0   Vitamin D, Ergocalciferol, (DRISDOL) 1.25 MG (50000 UNIT) CAPS capsule Take 50,000 Units by mouth every Monday.       zafirlukast (ACCOLATE) 20 MG tablet TAKE 1 TABLET (20 MG TOTAL) BY MOUTH 2 (TWO) TIMES DAILY BEFORE A MEAL. (Patient taking differently: Take 20 mg by mouth 2 (two) times daily.) 60 tablet 3   zolpidem (AMBIEN CR) 6.25 MG CR tablet Take 1 tablet (6.25 mg total) by mouth at bedtime as needed for sleep. 30 tablet 0    Allergies as of 09/17/2022   (No Known Allergies)    Family History  Problem Relation Age of Onset   Hypertension Mother    Stroke Mother    Lung cancer Father    Colon cancer Neg Hx    Stomach cancer Neg Hx    Pancreatic cancer Neg Hx     Social History   Socioeconomic History   Marital status: Widowed    Spouse name: Not on file   Number of children: Not on file   Years of education: Not on file   Highest education level: Not on file  Occupational History   Not on file  Tobacco Use   Smoking status: Former    Packs/day: 0.50    Years: 10.00    Additional pack years: 0.00    Total pack years: 5.00    Types: Cigarettes    Quit date: 06/15/1998    Years since quitting: 24.4    Passive exposure: Past   Smokeless tobacco: Never  Vaping Use   Vaping Use: Never used  Substance and Sexual Activity   Alcohol use: Never   Drug use: Never   Sexual activity: Yes  Other Topics Concern   Not on file  Social History Narrative   Three children.  Emergency planning/management officer in [place].     Social Determinants of Health   Financial Resource Strain: Not on file  Food Insecurity: Not on file  Transportation Needs: Not on file  Physical Activity: Not on file  Stress: Not on file  Social Connections: Not on file  Intimate Partner Violence: Not on file    Review of Systems:  All other review of systems negative except as mentioned in the HPI.  Physical Exam: Vital signs in last  24 hours: Blood Pressure (Abnormal) 143/100   Pulse 98   Temperature (Abnormal) 97.3 F (36.3 C) (Temporal)   Respiration 17   Height 5\' 9"  (1.753 m)   Weight 120.2 kg   Oxygen Saturation  97%   Body Mass Index 39.13 kg/m  General:   Alert, NAD Lungs:  Clear .   Heart:  Regular rate and rhythm Abdomen:  Soft, nontender and nondistended. Neuro/Psych:  Alert and cooperative. Normal mood and affect. A and O x 3  Reviewed labs, radiology imaging, old records and pertinent past GI work up     K. Scherry Ran , MD [phone]

## 2022-11-10 NOTE — Discharge Instructions (Signed)

## 2022-11-10 NOTE — Op Note (Signed)
[facility] Patient Name: Patty Howard Procedure Date: 11/10/2022 MRN: [id number] Attending MD: Napoleon Form , MD, [id number] Date of Birth: 03-Dec-1955 CSN: [id number] Age: 67 Admit Type: Outpatient Procedure:                Colonoscopy Indications:              Evaluation of unexplained GI bleeding presenting                            with fecal occult blood Providers:                Napoleon Form, MD, Fransisca Connors, Marja Kays, Technician Referring MD:              Medicines:                Monitored Anesthesia Care Complications:            No immediate complications. Estimated Blood Loss:     Estimated blood loss was minimal. Procedure:                Pre-Anesthesia Assessment:                           - Prior to the procedure, a History and Physical                            was performed, and patient medications and                            allergies were reviewed. The patient's tolerance of                            previous anesthesia was also reviewed. The risks                            and benefits of the procedure and the sedation                            options and risks were discussed with the patient.                            All questions were answered, and informed consent                            was obtained. Prior Anticoagulants: The patient has                            taken no anticoagulant or antiplatelet agents. ASA                            Grade Assessment: III - A patient with severe  systemic disease. After reviewing the risks and                            benefits, the patient was deemed in satisfactory                            condition to undergo the procedure.                           After obtaining informed consent, the colonoscope                            was passed under direct vision. Throughout the                             procedure, the patient's blood pressure, pulse, and                            oxygen saturations were monitored continuously. The                            [id number]-[id number] ([id number]) Olympus colonoscope was                            introduced through the anus and advanced to the the                            cecum, identified by appendiceal orifice and                            ileocecal valve. The colonoscopy was performed                            without difficulty. The patient tolerated the                            procedure well. The quality of the bowel                            preparation was good. The ileocecal valve,                            appendiceal orifice, and rectum were photographed. Scope In: 10:58:22 AM Scope Out: 11:16:20 AM Scope Withdrawal Time: 0 hours 13 minutes 53 seconds  Total Procedure Duration: 0 hours 17 minutes 58 seconds  Findings:      The perianal and digital rectal examinations were normal.      A 3 mm polyp was found in the descending colon. The polyp was sessile.       The polyp was removed with a cold snare. Resection and retrieval were       complete.      Scattered large-mouthed, medium-mouthed and small-mouthed diverticula       were found in the sigmoid colon, descending colon and ascending colon.      Non-bleeding external and internal hemorrhoids were found  during       retroflexion. The hemorrhoids were medium-sized. Impression:               - One 3 mm polyp in the descending colon, removed                            with a cold snare. Resected and retrieved.                           - Diverticulosis in the sigmoid colon, in the                            descending colon and in the ascending colon.                           - Non-bleeding external and internal hemorrhoids. Moderate Sedation:      N/A Recommendation:           - Patient has a contact number available for                            emergencies. The signs and symptoms  of potential                            delayed complications were discussed with the                            patient. Return to normal activities tomorrow.                            Written discharge instructions were provided to the                            patient.                           - Resume previous diet.                           - Continue present medications.                           - Await pathology results.                           - Repeat colonoscopy in 5-10 years for surveillance                            based on pathology results. Procedure Code(s):        --- Professional ---                           [phone], Colonoscopy, flexible; with removal of                            tumor(s), polyp(s), or other lesion(s) by snare  technique Diagnosis Code(s):        --- Professional ---                           D12.4, Benign neoplasm of descending colon                           K64.8, Other hemorrhoids                           R19.5, Other fecal abnormalities                           K57.30, Diverticulosis of large intestine without                            perforation or abscess without bleeding CPT copyright 2022 American Medical Association. All rights reserved. The codes documented in this report are preliminary and upon coder review may  be revised to meet current compliance requirements. Napoleon Form, MD 11/10/2022 11:27:58 AM This report has been signed electronically. Number of Addenda: 0

## 2022-11-10 NOTE — Anesthesia Postprocedure Evaluation (Signed)
Anesthesia Post Note  Patient: Patty Howard  Procedure(s) Performed: ESOPHAGOGASTRODUODENOSCOPY (EGD) WITH PROPOFOL COLONOSCOPY WITH PROPOFOL BIOPSY POLYPECTOMY     Patient location during evaluation: PACU Anesthesia Type: MAC Level of consciousness: awake Pain management: pain level controlled Vital Signs Assessment: post-procedure vital signs reviewed and stable Respiratory status: spontaneous breathing, nonlabored ventilation and respiratory function stable Cardiovascular status: stable and blood pressure returned to baseline Postop Assessment: no apparent nausea or vomiting Anesthetic complications: no   No notable events documented.  Last Vitals:  Vitals:   11/10/22 1140 11/10/22 1145  BP:  139/81  Pulse: 94 73  Resp: (!) 21 17  Temp:    SpO2: 99% 100%    Last Pain:  Vitals:   11/10/22 1145  TempSrc:   PainSc: 0-No pain                 Linton Rump

## 2022-11-10 NOTE — Anesthesia Procedure Notes (Signed)
Procedure Name: MAC Date/Time: 11/10/2022 10:43 AM  Performed by: Lovie Chol, CRNAPre-anesthesia Checklist: Patient identified, Emergency Drugs available, Suction available and Patient being monitored Oxygen Delivery Method: Simple face mask

## 2022-11-10 NOTE — Transfer of Care (Signed)
Immediate Anesthesia Transfer of Care Note  Patient: Patty Howard  Procedure(s) Performed: ESOPHAGOGASTRODUODENOSCOPY (EGD) WITH PROPOFOL COLONOSCOPY WITH PROPOFOL BIOPSY POLYPECTOMY  Patient Location: PACU  Anesthesia Type:MAC  Level of Consciousness: awake, oriented, and patient cooperative  Airway & Oxygen Therapy: Patient Spontanous Breathing and Patient connected to face mask oxygen  Post-op Assessment: Report given to RN and Post -op Vital signs reviewed and stable  Post vital signs: Reviewed  Last Vitals:  Vitals Value Taken Time  BP 127/72 11/10/22 1127  Temp    Pulse 94 11/10/22 1128  Resp 18 11/10/22 1128  SpO2 99 % 11/10/22 1128  Vitals shown include unvalidated device data.  Last Pain:  Vitals:   11/10/22 1003  TempSrc: Temporal  PainSc: 0-No pain         Complications: No notable events documented.

## 2022-11-11 ENCOUNTER — Other Ambulatory Visit: Payer: Self-pay

## 2022-11-11 LAB — SURGICAL PATHOLOGY

## 2022-11-11 MED ORDER — BISMUTH/METRONIDAZ/TETRACYCLIN 140-125-125 MG PO CAPS: 3.0000 | ORAL_CAPSULE | Freq: Four times a day (QID) | ORAL | 0 refills | Status: DC

## 2022-11-13 ENCOUNTER — Ambulatory Visit: Payer: Medicare HMO

## 2022-11-15 ENCOUNTER — Encounter (HOSPITAL_COMMUNITY): Payer: Self-pay | Admitting: Gastroenterology

## 2022-11-17 ENCOUNTER — Telehealth: Payer: Self-pay | Admitting: Gastroenterology

## 2022-11-17 DIAGNOSIS — M7989 Other specified soft tissue disorders: Secondary | ICD-10-CM | POA: Insufficient documentation

## 2022-11-17 NOTE — Telephone Encounter (Signed)
Inbound call from patient returning phone regarding recent lab results. Please advise, thank you.

## 2022-11-18 ENCOUNTER — Other Ambulatory Visit: Payer: Self-pay | Admitting: Adult Health

## 2022-11-18 ENCOUNTER — Other Ambulatory Visit: Payer: Self-pay

## 2022-11-18 DIAGNOSIS — M7989 Other specified soft tissue disorders: Secondary | ICD-10-CM

## 2022-11-18 MED ORDER — DOXYCYCLINE HYCLATE 100 MG PO CAPS: 100.0000 mg | ORAL_CAPSULE | Freq: Two times a day (BID) | ORAL | 0 refills | Status: AC

## 2022-11-18 MED ORDER — METRONIDAZOLE 250 MG PO TABS: 250.0000 mg | ORAL_TABLET | Freq: Four times a day (QID) | ORAL | 0 refills | Status: AC

## 2022-11-18 MED ORDER — BISMUTH SUBSALICYLATE 262 MG PO TABS: 2.0000 | ORAL_TABLET | Freq: Four times a day (QID) | ORAL | 0 refills | Status: AC

## 2022-11-18 NOTE — Telephone Encounter (Signed)
Returned call to patient ,patient made aware of results.Patient state her insurance doesn't cover Pylera medication. Patient informed there is another option for treatment and will be sent to pharmacy. Patient made aware to take PPI with medication.Patient made aware to come in for stool test following treatment in 6 weeks and to be off  PPI for 2 weeks prior to testing. Patient verbalized understanding. Patient aware of recall in 7 years.

## 2022-11-20 ENCOUNTER — Ambulatory Visit (INDEPENDENT_AMBULATORY_CARE_PROVIDER_SITE_OTHER): Payer: Medicare HMO | Admitting: *Deleted

## 2022-11-20 DIAGNOSIS — J4550 Severe persistent asthma, uncomplicated: Secondary | ICD-10-CM | POA: Diagnosis not present

## 2022-11-21 ENCOUNTER — Other Ambulatory Visit: Payer: Self-pay | Admitting: Pulmonary Disease

## 2022-11-21 ENCOUNTER — Other Ambulatory Visit: Payer: Self-pay | Admitting: Allergy & Immunology

## 2022-11-21 ENCOUNTER — Other Ambulatory Visit (HOSPITAL_BASED_OUTPATIENT_CLINIC_OR_DEPARTMENT_OTHER): Payer: Self-pay | Admitting: Family Medicine

## 2022-11-21 DIAGNOSIS — M25551 Pain in right hip: Secondary | ICD-10-CM

## 2022-11-23 NOTE — Telephone Encounter (Signed)
Please advise on refill request

## 2022-11-24 ENCOUNTER — Telehealth: Payer: Self-pay | Admitting: Allergy & Immunology

## 2022-11-24 NOTE — Telephone Encounter (Signed)
Patty Howard came into the office and states she does not like Ventolin and she feels like it doesn't work for her.  She asked if she could have Pro-Air.  I explained it is no longer being manufactured and she states to please give her something else that works.  She uses [facility] on [address]

## 2022-11-25 ENCOUNTER — Other Ambulatory Visit: Payer: Self-pay | Admitting: Adult Health

## 2022-11-25 ENCOUNTER — Ambulatory Visit
Admission: RE | Admit: 2022-11-25 | Discharge: 2022-11-25 | Disposition: A | Discharge status: Home / Self Care | Payer: Medicare HMO | Source: Ambulatory Visit | Attending: Adult Health | Admitting: Adult Health

## 2022-11-25 ENCOUNTER — Other Ambulatory Visit: Payer: Self-pay | Admitting: Allergy & Immunology

## 2022-11-25 DIAGNOSIS — M7989 Other specified soft tissue disorders: Secondary | ICD-10-CM

## 2022-11-25 MED ORDER — PROAIR RESPICLICK 108 (90 BASE) MCG/ACT IN AEPB: 1.0000 | INHALATION_SPRAY | Freq: Four times a day (QID) | RESPIRATORY_TRACT | 3 refills | Status: DC | PRN

## 2022-11-25 NOTE — Telephone Encounter (Signed)
I sent in ProAir Respiclick, which I think is still being made. I would be wrong.   Malachi Bonds, MD [facility]

## 2022-11-25 NOTE — Telephone Encounter (Signed)
Received this from the pharmacy, apparently the ProAir Respiclick is not covered by the patients insurance, would you like to try an alternative or submit a PA?

## 2022-11-25 NOTE — Telephone Encounter (Signed)
Message sent to provider in regards to Respiclick through refill request.

## 2022-11-26 NOTE — Telephone Encounter (Signed)
Can we please do a prior authorization for ProAir Respiclick? Patient has tried and failed Albuterol, Ventolin, and Proventil.

## 2022-11-26 NOTE — Telephone Encounter (Signed)
Patient is calling regarding the letter that was sent from [place] not being able to reach her regarding lab results. Explained to patient that letter was sent before the nurse was able to get ahold of her and explain the results to her. Patient is requesting another call from the nurse explaining results and recommendations. Please advise

## 2022-11-27 ENCOUNTER — Telehealth: Payer: Self-pay

## 2022-11-27 ENCOUNTER — Other Ambulatory Visit (HOSPITAL_COMMUNITY): Payer: Self-pay

## 2022-11-27 NOTE — Telephone Encounter (Signed)
PA request submitted with information of failure to alternatives, pending determination and will be updated in additional encounter created

## 2022-11-27 NOTE — Telephone Encounter (Signed)
*  Asthma/Allergy  PA request received for ProAir RespiClick 108 (90 Base)MCG/ACT aerosol powder  PA submitted to [facility] and is pending additional questions/determination  *patient has failed all other forms of albuterol  Key: [id number]

## 2022-11-27 NOTE — Telephone Encounter (Signed)
PA has been APPROVED from 11/27/2022-06/15/2023

## 2022-12-01 NOTE — Telephone Encounter (Signed)
Spoke with the patient. Today is the last day of her H Pylori treatment. Discussed the plan for retesting by stool to confirm successful eradication. Explained she will test in 6 weeks after being off acid reducers for 2 weeks.

## 2022-12-03 DIAGNOSIS — R82998 Other abnormal findings in urine: Secondary | ICD-10-CM | POA: Insufficient documentation

## 2022-12-21 ENCOUNTER — Ambulatory Visit: Payer: Medicare HMO

## 2022-12-23 DIAGNOSIS — I739 Peripheral vascular disease, unspecified: Secondary | ICD-10-CM | POA: Insufficient documentation

## 2022-12-25 ENCOUNTER — Other Ambulatory Visit: Payer: Self-pay | Admitting: Adult Health

## 2022-12-25 DIAGNOSIS — Z13820 Encounter for screening for osteoporosis: Secondary | ICD-10-CM

## 2022-12-27 ENCOUNTER — Other Ambulatory Visit: Payer: Self-pay | Admitting: Family Medicine

## 2023-01-08 ENCOUNTER — Ambulatory Visit: Payer: Medicare HMO | Admitting: *Deleted

## 2023-01-08 DIAGNOSIS — J4550 Severe persistent asthma, uncomplicated: Secondary | ICD-10-CM | POA: Diagnosis not present

## 2023-01-11 ENCOUNTER — Encounter: Payer: Self-pay | Admitting: Physician Assistant

## 2023-01-11 ENCOUNTER — Other Ambulatory Visit (INDEPENDENT_AMBULATORY_CARE_PROVIDER_SITE_OTHER): Payer: Medicare HMO

## 2023-01-11 ENCOUNTER — Ambulatory Visit (INDEPENDENT_AMBULATORY_CARE_PROVIDER_SITE_OTHER): Payer: Medicare HMO | Admitting: Physician Assistant

## 2023-01-11 DIAGNOSIS — M5416 Radiculopathy, lumbar region: Secondary | ICD-10-CM

## 2023-01-11 DIAGNOSIS — M7062 Trochanteric bursitis, left hip: Secondary | ICD-10-CM

## 2023-01-11 MED ORDER — METHYLPREDNISOLONE ACETATE 40 MG/ML IJ SUSP
40.0000 mg | INTRAMUSCULAR | Status: AC | PRN
Start: 2023-01-11 — End: 2023-01-11
  Administered 2023-01-11: 40 mg via INTRA_ARTICULAR

## 2023-01-11 MED ORDER — LIDOCAINE HCL 1 % IJ SOLN
5.0000 mL | INTRAMUSCULAR | Status: AC | PRN
Start: 2023-01-11 — End: 2023-01-11
  Administered 2023-01-11: 5 mL

## 2023-01-11 NOTE — Progress Notes (Signed)
Office Visit Note   Patient: Patty Howard           Date of Birth: 02-Sep-1955           MRN: [id number] Visit Date: 01/11/2023              Requested by: Melvenia Beam, MD [address]  [address] [place],  [place] [address] PCP: Melvenia Beam, MD  Chief Complaint  Patient presents with  . Left Leg - Pain      HPI: Patty Howard is a pleasant 67 year old woman who is a patient of Dr. Ophelia Charter.  She has a history of a left total hip arthroplasty in 2022.  She comes in today complaining of pain that is burning and radiating down the lateral side of her left leg.  Denies any injury.  She also feels like her leg is gotten a little bit weak.  She denies any groin pain.  The burning she rates as moderate to severe.  Makes it difficult for her to get out of bed.  She cannot sleep on her left side.  She has had a recent weight loss  Assessment & Plan: Visit Diagnoses:  1. Lumbar radiculopathy   Trochanteric bursitis left hip  Plan: Although she does have quite a bit of degeneration in her spine by x-rays by exam today I think this is more from trochanteric bursitis.  Will get a try an injection today see if this helps her she is also use topical Voltaren gel.  If it does not she will let me know we could consider trial of oral steroids or referral for back injections.  Previous injections of all been right-sided symptoms I think this may have flared up because she has lost quite a bit of weight and so she has lost weight and her hips  Follow-Up Instructions: As needed  Ortho Exam  Patient is alert, oriented, no adenopathy, well-dressed, normal affect, normal respiratory effort. Left hip she is exquisitely tender over the trochanteric bursa.  No erythema no redness no warmth.  Compartments are soft and compressible she has a negative straight leg raise no pain with range of motion with flexion extension of her back no paresthesias no pain with manipulation of her hip she  does have pain with frog-leg in of her leg reproduces pain along the IT band and the trochanteric bursa  Imaging: XR Lumbar Spine 2-3 Views  Result Date: 01/11/2023 Radiographs of her lumbar spine were obtained today she has a stable hip replacement noted on the x-rays she does have degenerative changes throughout her spine with a listhesis at L4-5 minimally changed from previous films  No images are attached to the encounter.  Labs: Lab Results  Component Value Date   HGBA1C 6.0 (H) 10/21/2021   HGBA1C 6.7 (H) 11/26/2019   HGBA1C 6.3 (H) 05/23/2019   ESRSEDRATE 27 10/31/2019   CRP 6 10/31/2019   REPTSTATUS 04/18/2022 FINAL 04/18/2022   REPTSTATUS 04/20/2022 FINAL 04/18/2022   GRAMSTAIN  04/18/2022    ABUNDANT GRAM POSITIVE COCCI IN PAIRS IN CHAINS ABUNDANT GRAM NEGATIVE RODS FEW GRAM POSITIVE RODS RARE YEAST WITH PSEUDOHYPHAE FEW SQUAMOUS EPITHELIAL CELLS PRESENT MODERATE WBC PRESENT, PREDOMINANTLY PMN    CULT  04/18/2022    FEW Normal respiratory flora-no Staph aureus or Pseudomonas seen Performed at [facility], [address] [place], [place] [address]      Lab Results  Component Value Date   ALBUMIN 4.5 10/21/2021   ALBUMIN  4.4 09/27/2021   ALBUMIN 4.0 01/16/2021    Lab Results  Component Value Date   MG 2.0 04/21/2022   MG 2.2 04/18/2022   MG 1.7 09/27/2021   No results found for: "VD25OH"  No results found for: "PREALBUMIN"    Latest Ref Rng & Units 08/17/2022    4:02 AM 07/07/2022    2:03 PM 07/03/2022   10:45 PM  CBC EXTENDED  WBC 4.0 - 10.5 K/uL 7.2  11.7  11.6   RBC 3.87 - 5.11 MIL/uL 4.39  4.54  4.09   Hemoglobin 12.0 - 15.0 g/dL 64.4  03.4  74.2   HCT 36.0 - 46.0 % 36.5  38.2  34.3   Platelets 150 - 400 K/uL 327  362  310   NEUT# 1.7 - 7.7 K/uL 3.7  7.4    Lymph# 0.7 - 4.0 K/uL 2.8  3.7       There is no height or weight on file to calculate BMI.  Orders:  Orders Placed This Encounter  Procedures  . XR Lumbar Spine 2-3 Views    No orders of the defined types were placed in this encounter.    Procedures: Large Joint Inj: L greater trochanter on 01/11/2023 2:10 PM Indications: pain and diagnostic evaluation Details: 22 G 1.5 in and 3.5 in needle, lateral approach  Arthrogram: No  Medications: 5 mL lidocaine 1 %; 40 mg methylPREDNISolone acetate 40 MG/ML Outcome: tolerated well, no immediate complications Procedure, treatment alternatives, risks and benefits explained, specific risks discussed. Consent was given by the patient.    Clinical Data: No additional findings.  ROS:  All other systems negative, except as noted in the HPI. Review of Systems  Objective: Vital Signs: There were no vitals taken for this visit.  Specialty Comments:  EXAM: MRI LUMBAR SPINE WITHOUT AND WITH CONTRAST   TECHNIQUE: Multiplanar and multiecho pulse sequences of the lumbar spine were obtained without and with intravenous contrast.   CONTRAST:  20mL MULTIHANCE GADOBENATE DIMEGLUMINE 529 MG/ML IV SOLN   COMPARISON:  None.   FINDINGS: Segmentation:  Standard.   Alignment:  Trace anterolisthesis at L4-L5 and L5-S1.   Vertebrae: Vertebral body heights are maintained. There is no substantial marrow edema. No suspicious osseous lesion.   Conus medullaris and cauda equina: Conus extends to the T12-L1 level. Conus and cauda equina appear normal.   Paraspinal and other soft tissues: Unremarkable.   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   L1-L2: Prominent epidural fat mildly flattening the thecal sac. No foraminal stenosis.   L2-L3: Small left foraminal protrusion. Mild facet arthropathy. Prominent dorsal epidural fat mildly flattening the thecal sac. No right foraminal stenosis. Minor left foraminal stenosis.   L3-L4: Disc bulge. Marked right and mild left facet arthropathy with ligamentum flavum infolding. Moderate canal stenosis. Narrowing of the subarticular recesses. Mild foraminal  stenosis.   L4-L5: Disc bulge. Marked facet arthropathy with prominent hypertrophic changes and ligamentum flavum infolding. Marked canal stenosis. Narrowing of the subarticular recesses. Mild right foraminal stenosis. No left foraminal stenosis.   L5-S1: Disc bulge. Marked facet arthropathy with prominent hypertrophic changes and ligamentum flavum infolding. Minor canal stenosis. Mild foraminal stenosis.   IMPRESSION: Multilevel degenerative changes as detailed above. Canal stenosis is greatest L4-L5. There is marked lower lumbar facet arthropathy.     Electronically Signed   By: Guadlupe Spanish M.D.   On: 10/14/2020 10:29  PMFS History: Patient Active Problem List   Diagnosis Date Noted  . Heme positive  stool 11/10/2022  . Polyp of descending colon 11/10/2022  . Globus sensation 11/10/2022  . Severe persistent asthma without complication 10/08/2022  . 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 Perennial allergic rhinitis 10/08/2022  . COPD exacerbation (HCC) 04/18/2022  . Right hip pain 02/17/2022  . Cough 12/05/2021  . Type 2 diabetes mellitus without complication, without long-term current use of insulin (HCC) 11/05/2021  . Trigger finger, left middle finger 10/02/2021  . COPD with acute exacerbation (HCC) 09/28/2021  . Acute exacerbation of COPD with asthma (HCC) 09/27/2021  . Other intervertebral disc degeneration, lumbar region 07/15/2021  . History of total hip arthroplasty, left 03/07/2021  . Trochanteric bursitis, left hip 12/23/2020  . 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  . Diastolic dysfunction 11/25/2020  . Asthma exacerbation 11/13/2020  . Menopause 11/08/2020  . Medication management 04/30/2020  . Gastroesophageal reflux disease 03/14/2020  . OSA (obstructive sleep apnea) 02/15/2020  . Abnormal findings on diagnostic imaging of lung 01/08/2020  . Educated about COVID-19 virus  infection 11/30/2019  . Aortic atherosclerosis (HCC) 11/30/2019  . Chest pain 11/26/2019  . Acute asthma exacerbation 05/22/2019  . Left radial head fracture 02/03/2019  . Closed fracture of radial head 01/29/2019  . 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) 10/19/2018  . Insomnia 10/19/2018  . Elbow pain, chronic, left 10/19/2018  . Dyspnea 10/12/2017  . Pancolitis (HCC) 05/20/2017  . H/O Spinal surgery 01/18/2015  . Essential hypertension 01/18/2015  . Obesity 01/18/2015   Past Medical History:  Diagnosis Date  . Anxiety   . Arthritis   . Asthma   . COPD (chronic obstructive pulmonary disease) (HCC)   . Fracture    closed displaced left radial head  . GERD (gastroesophageal reflux disease)   . Hypertension   . Sleep apnea    does not wear CPAP    Family History  Problem Relation Age of Onset  . Hypertension Mother   . Stroke Mother   . Lung cancer Father   . Colon cancer Neg Hx   . Stomach cancer Neg Hx   . Pancreatic cancer Neg Hx     Past Surgical History:  Procedure Laterality Date  . BACK SURGERY     spinal stimulator  . BIOPSY  11/10/2022   Procedure: BIOPSY;  Surgeon: Napoleon Form, MD;  Location: [facility];  Service: Gastroenterology;;  . BREAST BIOPSY Right 2017   benign  . COLONOSCOPY W/ BIOPSIES AND POLYPECTOMY    . COLONOSCOPY WITH PROPOFOL N/A 11/10/2022   Procedure: COLONOSCOPY WITH PROPOFOL;  Surgeon: Napoleon Form, MD;  Location: [facility];  Service: Gastroenterology;  Laterality: N/A;  . DILATION AND CURETTAGE OF UTERUS    . ESOPHAGOGASTRODUODENOSCOPY (EGD) WITH PROPOFOL N/A 11/10/2022   Procedure: ESOPHAGOGASTRODUODENOSCOPY (EGD) WITH PROPOFOL;  Surgeon: Napoleon Form, MD;  Location: [facility];  Service: Gastroenterology;  Laterality: N/A;  . LAPAROSCOPIC ROUX-EN-Y GASTRIC BYPASS WITH UPPER ENDOSCOPY AND REMOVAL OF LAP BAND    . POLYPECTOMY  11/10/2022   Procedure: POLYPECTOMY;  Surgeon: Napoleon Form,  MD;  Location: [facility];  Service: Gastroenterology;;  . RADIAL HEAD ARTHROPLASTY Left 02/03/2019   Procedure: LEFT RADIAL HEAD ARTHROPLASTY;  Surgeon: Eldred Manges, MD;  Location: [facility] OR;  Service: Orthopedics;  Laterality: Left;  AXILLARY BLOCK VS BIER BLOCK  . TOTAL HIP ARTHROPLASTY Left 01/22/2021   Procedure: LEFT TOTAL HIP ARTHROPLASTY ANTERIOR APPROACH;  Surgeon: Eldred Manges, MD;  Location: [facility] OR;  Service: Orthopedics;  Laterality: Left;  . TUBAL LIGATION     Social History   Occupational History  . Not on file  Tobacco Use  . Smoking status: Former    Current packs/day: 0.00    Average packs/day: 0.5 packs/day for 10.0 years (5.0 ttl pk-yrs)    Types: Cigarettes    Start date: 06/15/1988    Quit date: 06/15/1998    Years since quitting: 24.5    Passive exposure: Past  . Smokeless tobacco: Never  Vaping Use  . Vaping status: Never Used  Substance and Sexual Activity  . Alcohol use: Never  . Drug use: Never  . Sexual activity: Yes

## 2023-01-12 ENCOUNTER — Other Ambulatory Visit: Payer: Self-pay

## 2023-01-12 DIAGNOSIS — A048 Other specified bacterial intestinal infections: Secondary | ICD-10-CM

## 2023-01-13 ENCOUNTER — Other Ambulatory Visit: Payer: Self-pay | Admitting: Family Medicine

## 2023-01-13 DIAGNOSIS — J4550 Severe persistent asthma, uncomplicated: Secondary | ICD-10-CM

## 2023-01-13 DIAGNOSIS — K219 Gastro-esophageal reflux disease without esophagitis: Secondary | ICD-10-CM

## 2023-01-18 ENCOUNTER — Other Ambulatory Visit: Payer: MEDICARE

## 2023-01-19 DIAGNOSIS — E785 Hyperlipidemia, unspecified: Secondary | ICD-10-CM

## 2023-01-19 DIAGNOSIS — I5032 Chronic diastolic (congestive) heart failure: Secondary | ICD-10-CM | POA: Insufficient documentation

## 2023-01-19 HISTORY — DX: Chronic diastolic (congestive) heart failure: I50.32

## 2023-01-19 HISTORY — DX: Hyperlipidemia, unspecified: E78.5

## 2023-01-19 NOTE — Progress Notes (Unsigned)
Cardiology Office Note:   Date:  01/20/2023  ID:  Patty Howard, DOB May 29, 1956, MRN [id number] PCP: Melvenia Beam, MD  [facility] Providers Cardiologist:  Rollene Rotunda, MD Cardiology APP:  Marcelino Duster, PA {  History of Present Illness:   Patty Howard is a 67 y.o. female  is a PMH of OSA on CPAP, COPD, asthma, and  HFpEF.  She used to be a Emergency planning/management officer in [place].  She establish care with me on 12/01/2019.  She was being seen for shortness of breath and chest discomfort.  She had been hospitalized for 2 days prior to her clinic visit with chest discomfort and shortness of breath.  She was treated for HFpEF.  Her echocardiogram 6/21 showed an LVEF of 65-70%, G1 DD, no regional wall motion abnormalities and no significant valvular disease.  She had nuclear stress testing which was negative for ischemia.  She was noted to have aortic atherosclerosis on CTA 2021.  She was also noted to have pulmonary nodules that were identified on CT which are followed by pulmonology.   She was hospitalized 4/23 with COPD exacerbation secondary to influenza A infection.  She was treated with Tamiflu and doxycycline.  She was discharged on prednisone taper.  She presented for follow-up on 10/21/2021 with Angie Duke PA-C.  She was out of her medications at that time.  Her echocardiogram was repeated and showed normal LVEF, moderate concentric LVH, G1 DD, mild dilation of her left atria and no significant valvular abnormalities.  Her medications were refilled and follow-up was planned for around 6 months.  She was seen by Edd Fabian NP in Jan.     She presents to the clinic today for follow-up evaluation.  She has been doing relatively well.  The patient denies any new symptoms such as chest discomfort, neck or arm discomfort. There has been no new shortness of breath, PND or orthopnea. There have been no reported palpitations, presyncope or syncope.   ROS: As stated in the HPI  and negative for all other systems.  Studies Reviewed:    EKG:   EKG Interpretation Date/Time:  Wednesday January 20 2023 10:23:19 EDT Ventricular Rate:  89 PR Interval:  146 QRS Duration:  86 QT Interval:  396 QTC Calculation: 481 R Axis:   65  Text Interpretation: Normal sinus rhythm with sinus arrhythmia Nonspecific ST abnormality When compared with ECG of 17-Aug-2022 03:43, No significant change since last tracing Confirmed by Rollene Rotunda ([id number]) on 01/20/2023 11:15:28 AM    Risk Assessment/Calculations:              Physical Exam:   VS:  BP 114/74 (BP Location: Left Arm, Patient Position: Sitting, Cuff Size: Normal)   Pulse (!) 34   Ht 5\' 9"  (1.753 m)   Wt 259 lb 9.6 oz (117.8 kg)   SpO2 93%   BMI 38.34 kg/m    Wt Readings from Last 3 Encounters:  01/20/23 259 lb 9.6 oz (117.8 kg)  11/10/22 265 lb (120.2 kg)  10/08/22 273 lb 3.2 oz (123.9 kg)     GEN: Well nourished, well developed in no acute distress NECK: No JVD; No carotid bruits CARDIAC: RRR, soft apical systolic murmurs, rubs, gallops RESPIRATORY:  Clear to auscultation without rales,  positive mild wheezing but no rhonchi  ABDOMEN: Soft, non-tender, non-distended EXTREMITIES:  No edema; No deformity   ASSESSMENT AND PLAN:   HFpEF: The patient is euvolemic.  No change in therapy.  She will  continue with the meds as listed.  DOE: She does have a little bit of a cough and wheezing today.  She is getting take her albuterol twice and see her primary provider.  Hyperlipidemia: Do not see her most recent cholesterol.  I will ask her primary provider to send this.  For now she will continue the atorvastatin.  Essential hypertension: Her blood pressure is well-controlled.  No change in therapy.   Type 2 diabetes: Last A1c that I see is 6.0.  We will check for these results from this year.  She thinks it is around that same level.         Follow up with Edd Fabian NP in about six months.   Signed, Rollene Rotunda, MD

## 2023-01-20 ENCOUNTER — Encounter: Payer: Self-pay | Admitting: Cardiology

## 2023-01-20 ENCOUNTER — Ambulatory Visit: Payer: MEDICARE | Attending: Cardiology | Admitting: Cardiology

## 2023-01-20 VITALS — BP 114/74 | HR 34 | Ht 69.0 in | Wt 259.6 lb

## 2023-01-20 DIAGNOSIS — I10 Essential (primary) hypertension: Secondary | ICD-10-CM

## 2023-01-20 DIAGNOSIS — I5032 Chronic diastolic (congestive) heart failure: Secondary | ICD-10-CM | POA: Diagnosis not present

## 2023-01-20 DIAGNOSIS — E785 Hyperlipidemia, unspecified: Secondary | ICD-10-CM | POA: Diagnosis not present

## 2023-01-20 DIAGNOSIS — R0602 Shortness of breath: Secondary | ICD-10-CM | POA: Diagnosis not present

## 2023-01-20 DIAGNOSIS — E118 Type 2 diabetes mellitus with unspecified complications: Secondary | ICD-10-CM

## 2023-01-20 NOTE — Patient Instructions (Signed)
Medication Instructions:  Continue same medications *If you need a refill on your cardiac medications before your next appointment, please call your pharmacy*   Lab Work: None ordered   Testing/Procedures: None ordered   Follow-Up: At [facility], you and your health needs are our priority.  As part of our continuing mission to provide you with exceptional heart care, we have created designated Provider Care Teams.  These Care Teams include your primary Cardiologist (physician) and Advanced Practice Providers (APPs -  Physician Assistants and Nurse Practitioners) who all work together to provide you with the care you need, when you need it.  We recommend signing up for the patient portal called "MyChart".  Sign up information is provided on this After Visit Summary.  MyChart is used to connect with patients for Virtual Visits (Telemedicine).  Patients are able to view lab/test results, encounter notes, upcoming appointments, etc.  Non-urgent messages can be sent to your provider as well.   To learn more about what you can do with MyChart, go to [email or web address].    Your next appointment:  6 months     Call in Sept to schedule Feb appointment     Provider:  Edd Fabian NP

## 2023-01-21 ENCOUNTER — Other Ambulatory Visit: Payer: MEDICARE

## 2023-01-21 DIAGNOSIS — A048 Other specified bacterial intestinal infections: Secondary | ICD-10-CM

## 2023-01-29 ENCOUNTER — Other Ambulatory Visit: Payer: Self-pay

## 2023-01-29 MED ORDER — BISMUTH/METRONIDAZ/TETRACYCLIN 140-125-125 MG PO CAPS: 3.0000 | ORAL_CAPSULE | Freq: Three times a day (TID) | ORAL | 0 refills | Status: DC

## 2023-02-02 ENCOUNTER — Other Ambulatory Visit: Payer: Self-pay

## 2023-02-02 DIAGNOSIS — M26629 Arthralgia of temporomandibular joint, unspecified side: Secondary | ICD-10-CM | POA: Insufficient documentation

## 2023-02-02 MED ORDER — TALICIA 250-12.5-10 MG PO CPDR: 4.0000 | DELAYED_RELEASE_CAPSULE | Freq: Three times a day (TID) | ORAL | 0 refills | Status: DC

## 2023-02-03 ENCOUNTER — Other Ambulatory Visit: Payer: Self-pay | Admitting: Gastroenterology

## 2023-02-03 ENCOUNTER — Telehealth: Payer: Self-pay

## 2023-02-03 ENCOUNTER — Other Ambulatory Visit: Payer: Self-pay

## 2023-02-03 MED ORDER — OMEPRAZOLE 40 MG PO CPDR: 40.0000 mg | DELAYED_RELEASE_CAPSULE | Freq: Every day | ORAL | 0 refills | Status: DC

## 2023-02-03 MED ORDER — OMEPRAZOLE 40 MG PO CPDR
40.0000 mg | DELAYED_RELEASE_CAPSULE | Freq: Three times a day (TID) | ORAL | 0 refills | Status: DC
Start: 2023-02-03 — End: 2023-02-03

## 2023-02-03 MED ORDER — RIFABUTIN 150 MG PO CAPS: 150.0000 mg | ORAL_CAPSULE | Freq: Every day | ORAL | 0 refills | Status: AC

## 2023-02-03 MED ORDER — AMOXICILLIN 500 MG PO CAPS: 1000.0000 mg | ORAL_CAPSULE | Freq: Three times a day (TID) | ORAL | 0 refills | Status: AC

## 2023-02-03 NOTE — Telephone Encounter (Signed)
This patient failed the initial treatment of H Pylori using Flagyl and Doxycycline. Patty Howard is non-formulary with her insurance. Please advise on alternative medication. Patient does not have any allergies.

## 2023-02-03 NOTE — Telephone Encounter (Signed)
Please send prescription for omeprazole 40 mg 3 times daily, amoxicillin 1000 mg 3 times daily and rifabutin 50 mg 3 times daily for 14 days.  Will do stool test 2 to 4 weeks after completion of therapy to document eradication of H. pylori.

## 2023-02-03 NOTE — Telephone Encounter (Signed)
Patient advised her insurance will not consider Phoebe Perch as it is non-formulary. Insurance will not cover omeprazole 40 mg three time daily. Discussed acid suppression with the patient. She understands this is an important component to eradicating H Pylori. Omeprazole 40 mg changed to daily.  She will stop pantoprazole.

## 2023-02-03 NOTE — Telephone Encounter (Signed)
Called the patient to update her. No answer. Left her information on her voicemail.

## 2023-02-05 ENCOUNTER — Ambulatory Visit: Payer: Medicare HMO

## 2023-02-07 ENCOUNTER — Other Ambulatory Visit: Payer: Self-pay | Admitting: Allergy & Immunology

## 2023-02-16 ENCOUNTER — Ambulatory Visit (INDEPENDENT_AMBULATORY_CARE_PROVIDER_SITE_OTHER): Payer: Medicare HMO | Admitting: *Deleted

## 2023-02-16 DIAGNOSIS — J4550 Severe persistent asthma, uncomplicated: Secondary | ICD-10-CM | POA: Diagnosis not present

## 2023-02-18 ENCOUNTER — Telehealth: Payer: Self-pay | Admitting: Gastroenterology

## 2023-02-18 NOTE — Telephone Encounter (Signed)
Inbound call from patient requesting to go over instructions for medications. States she believes she is not taking the medications properly. Please advise, thank you.

## 2023-02-19 NOTE — Telephone Encounter (Signed)
Called patient. No answer. Left message of my call. Patient recently treated for H Pylori.

## 2023-02-24 NOTE — Telephone Encounter (Signed)
Spoke with the patient. She has completed her antibiotics for the treatment of H Pylori. She will continue the omeprazole for the next 2 weeks. Then wait 2 weeks and retest by stool specimen. Reviewed this in detail with the patient using teach back method.

## 2023-02-25 ENCOUNTER — Ambulatory Visit
Admission: RE | Admit: 2023-02-25 | Discharge: 2023-02-25 | Disposition: A | Discharge status: Home / Self Care | Payer: Medicare HMO | Source: Ambulatory Visit | Attending: Adult Health | Admitting: Adult Health

## 2023-02-25 ENCOUNTER — Other Ambulatory Visit: Payer: MEDICARE

## 2023-02-25 DIAGNOSIS — Z13820 Encounter for screening for osteoporosis: Secondary | ICD-10-CM

## 2023-03-08 ENCOUNTER — Other Ambulatory Visit: Payer: Self-pay

## 2023-03-08 ENCOUNTER — Other Ambulatory Visit: Payer: Medicare HMO

## 2023-03-08 DIAGNOSIS — A048 Other specified bacterial intestinal infections: Secondary | ICD-10-CM

## 2023-03-10 ENCOUNTER — Ambulatory Visit: Payer: Medicare HMO

## 2023-03-10 DIAGNOSIS — A048 Other specified bacterial intestinal infections: Secondary | ICD-10-CM

## 2023-03-12 LAB — H. PYLORI ANTIGEN, STOOL: H pylori Ag, Stl: NEGATIVE

## 2023-03-16 ENCOUNTER — Ambulatory Visit: Payer: Medicare HMO | Admitting: *Deleted

## 2023-03-16 DIAGNOSIS — J4550 Severe persistent asthma, uncomplicated: Secondary | ICD-10-CM | POA: Diagnosis not present

## 2023-04-08 ENCOUNTER — Other Ambulatory Visit: Payer: Self-pay | Admitting: Internal Medicine

## 2023-04-08 DIAGNOSIS — Z1231 Encounter for screening mammogram for malignant neoplasm of breast: Secondary | ICD-10-CM

## 2023-04-09 ENCOUNTER — Ambulatory Visit: Payer: Medicare HMO | Admitting: Pulmonary Disease

## 2023-04-13 ENCOUNTER — Encounter: Payer: Self-pay | Admitting: Pulmonary Disease

## 2023-04-13 ENCOUNTER — Ambulatory Visit: Payer: Medicare HMO

## 2023-04-17 IMAGING — DX DG CHEST 2V
2 series · 2 of 2 positions shown · non-contrast
Comparison: September 30, 2020

CLINICAL DATA: Bronchitis and influenza

EXAM:
CHEST - 2 VIEW

[chest pa]
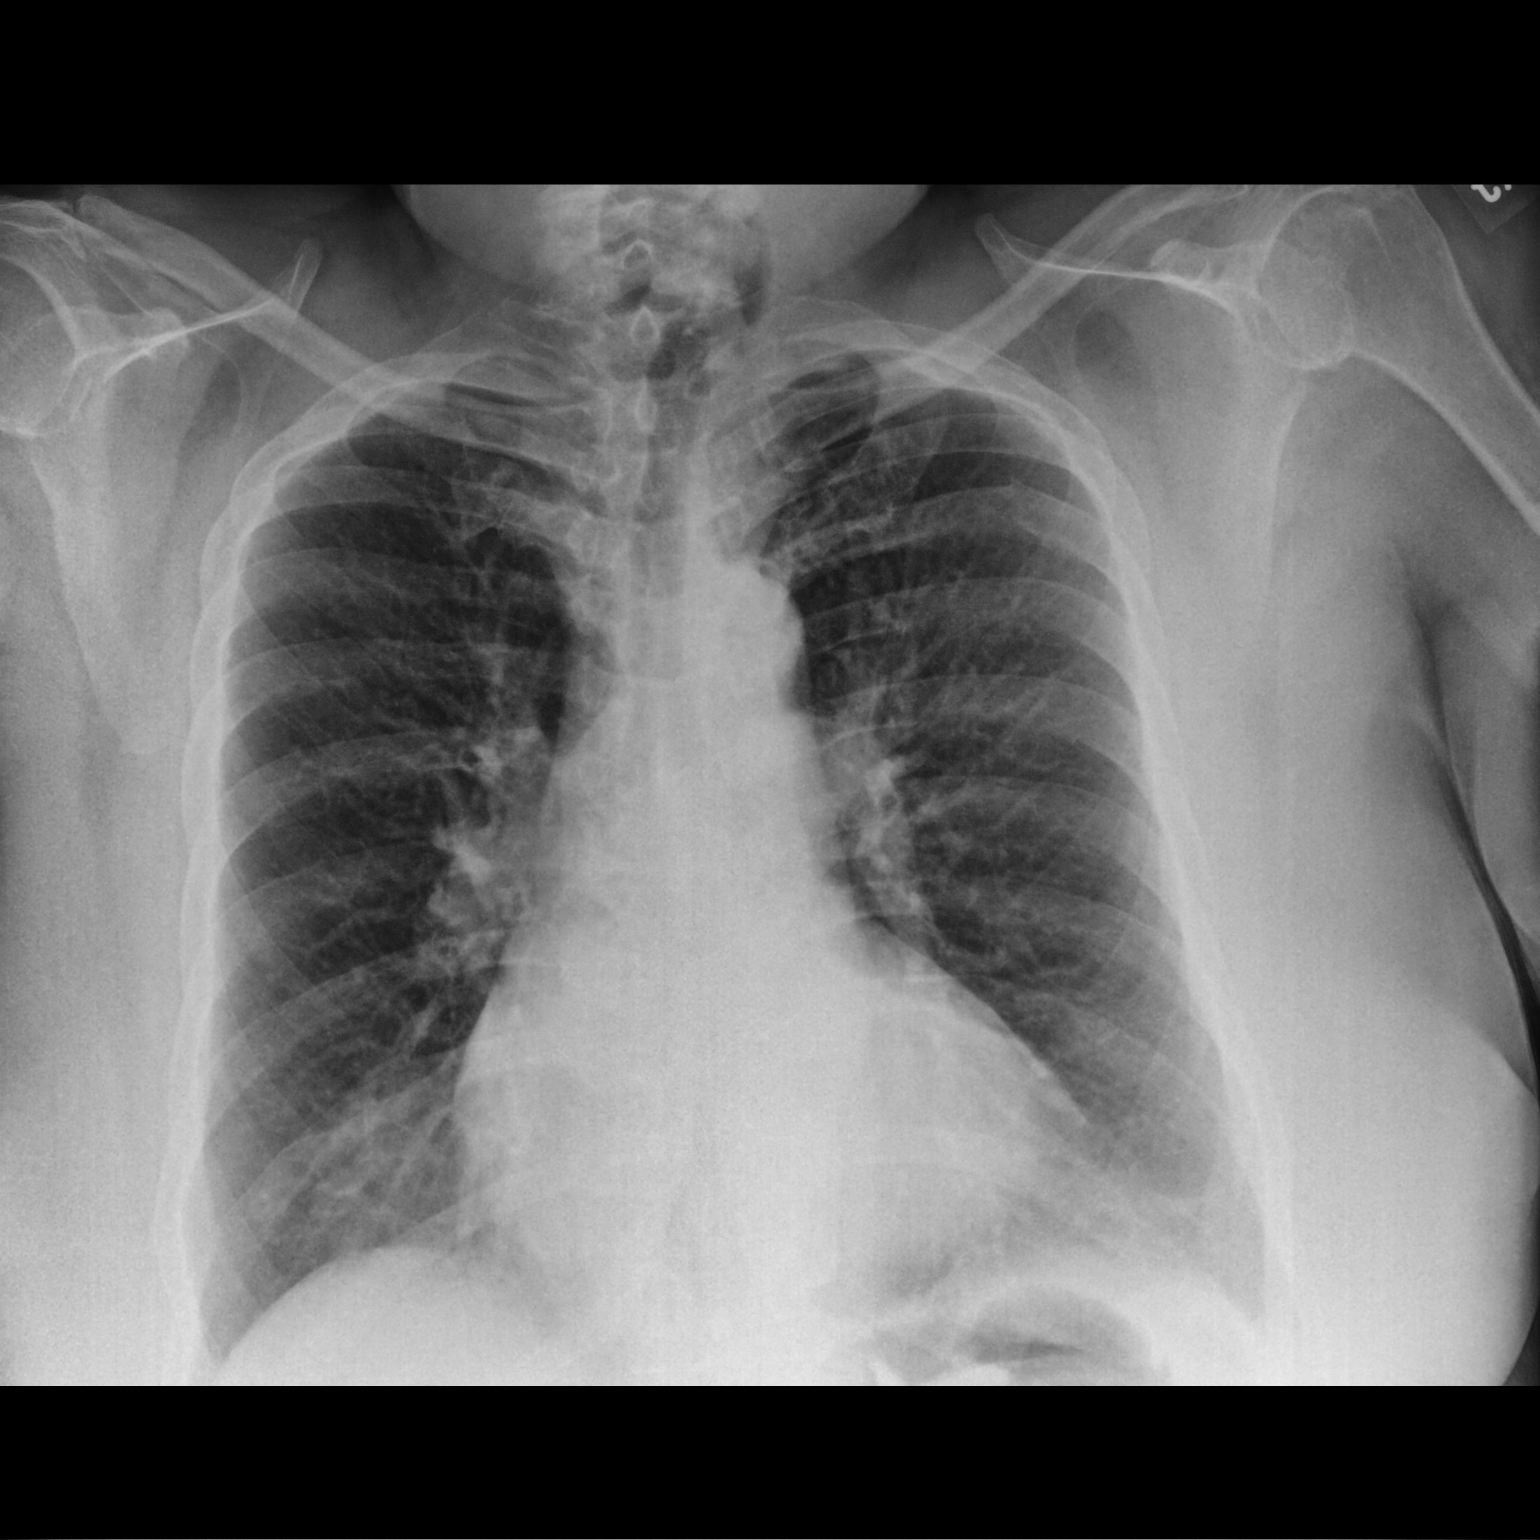

[chest lat]
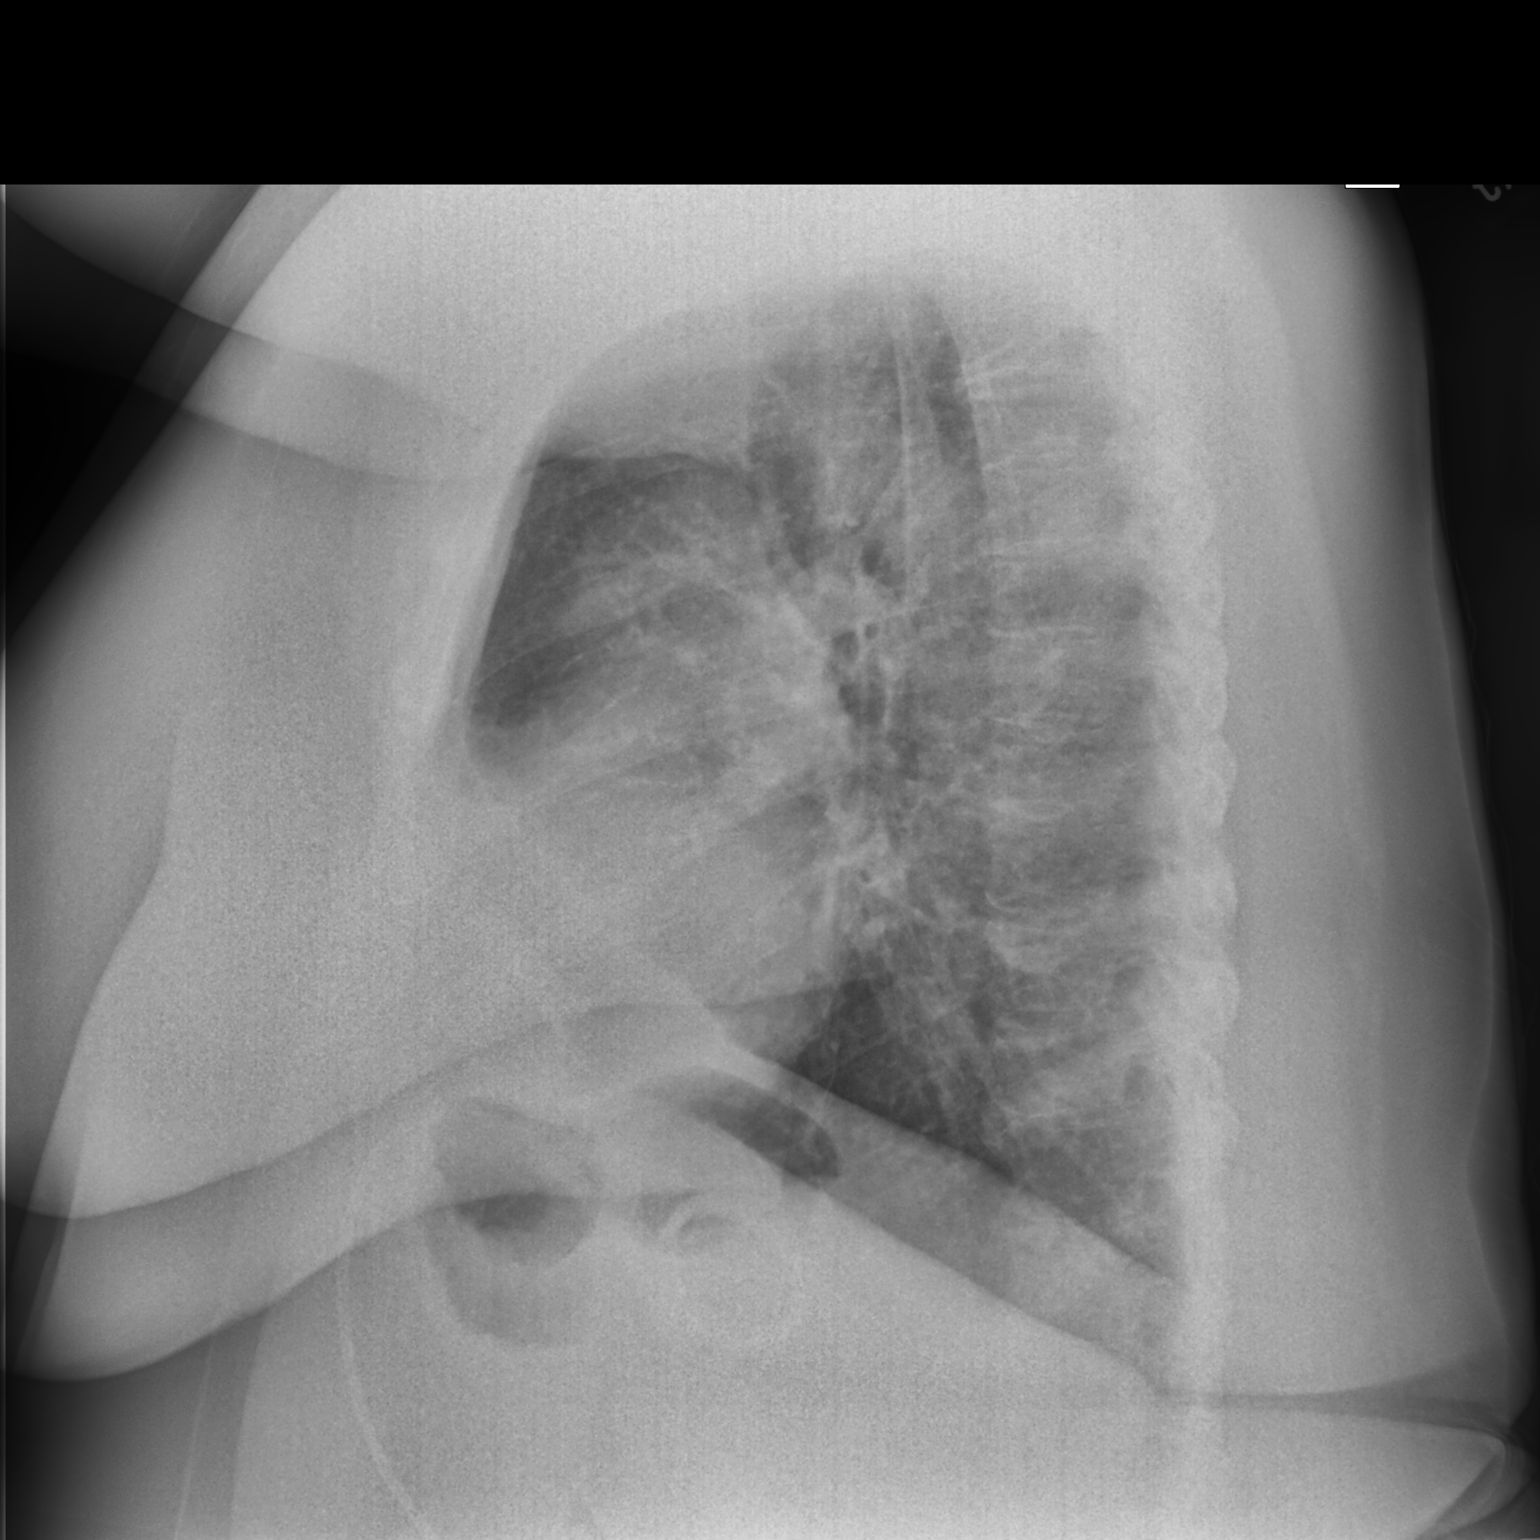

[2 of 2 positions shown; findings below may reference images not displayed]

FINDINGS: Interstitium is slightly prominent. No edema or airspace opacity.
Heart is upper normal in size with pulmonary vascularity normal. No
adenopathy. No bone lesions.
IMPRESSION: Mild interstitial prominence, likely indicating underlying
bronchitis. No edema or consolidation. Heart is normal in size. No
adenopathy.

## 2023-04-19 ENCOUNTER — Ambulatory Visit: Payer: Medicare HMO | Admitting: Family Medicine

## 2023-04-19 ENCOUNTER — Ambulatory Visit: Payer: Medicare HMO

## 2023-04-19 DIAGNOSIS — J4550 Severe persistent asthma, uncomplicated: Secondary | ICD-10-CM | POA: Diagnosis not present

## 2023-04-19 NOTE — Progress Notes (Deleted)
   [address] [place] [address] Dept: [phone]  FOLLOW UP NOTE  Patient ID: Patty Howard, female    DOB: 1956/04/04  Age: 67 y.o. MRN: [id number] Date of Office Visit: 04/19/2023  Assessment  Chief Complaint: No chief complaint on file.  HPI Patty Howard is a 67 year old female who presents to the clinic for follow-up visit.  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  Her problem list is significant for diastolic heart failure and obesity. Discussed the use of AI scribe software for clinical note transcription with the patient, who gave verbal consent to proceed.  History of Present Illness             Drug Allergies:  No Active Allergies  Physical Exam: There were no vitals taken for this visit.   Physical Exam  Diagnostics:    Assessment and Plan: No diagnosis found.  No orders of the defined types were placed in this encounter.   There are no Patient Instructions on file for this visit.  No follow-ups on file.    Thank you for the opportunity to care for this patient.  Please do not hesitate to contact me with questions.  Thermon Leyland, FNP [facility]

## 2023-04-21 LAB — LAB REPORT - SCANNED
A1c: 5.8
Albumin, Urine POC: 0.4
Creatinine, POC: 248 mg/dL
Microalb Creat Ratio: 2

## 2023-05-03 ENCOUNTER — Telehealth: Payer: Self-pay | Admitting: Pulmonary Disease

## 2023-05-03 NOTE — Telephone Encounter (Signed)
Patient needs a new sleep study completed so she can get a new cpap. Her's is currently broken. She was wondering if she could have her sleep study scheduled before she has her appointment in January to speed up the process.

## 2023-05-10 NOTE — Telephone Encounter (Signed)
Order for home sleep study already in chart.   PCCs please contact patient to schedule. Thanks!

## 2023-05-17 ENCOUNTER — Other Ambulatory Visit: Payer: Self-pay | Admitting: Orthopaedic Surgery

## 2023-05-17 ENCOUNTER — Ambulatory Visit: Payer: Medicare HMO

## 2023-05-17 ENCOUNTER — Other Ambulatory Visit: Payer: Self-pay

## 2023-05-17 ENCOUNTER — Emergency Department (HOSPITAL_BASED_OUTPATIENT_CLINIC_OR_DEPARTMENT_OTHER)
Admission: EM | Admit: 2023-05-17 | Discharge: 2023-05-17 | Disposition: A | Discharge status: Home / Self Care | Payer: Medicare HMO | Attending: Emergency Medicine | Admitting: Emergency Medicine

## 2023-05-17 DIAGNOSIS — M545 Low back pain, unspecified: Secondary | ICD-10-CM | POA: Diagnosis present

## 2023-05-17 DIAGNOSIS — G8929 Other chronic pain: Secondary | ICD-10-CM | POA: Insufficient documentation

## 2023-05-17 MED ORDER — TIZANIDINE HCL 2 MG PO TABS: 2.0000 mg | ORAL_TABLET | Freq: Three times a day (TID) | ORAL | 0 refills | Status: DC | PRN

## 2023-05-17 MED ORDER — METHYLPREDNISOLONE 4 MG PO TBPK: ORAL_TABLET | ORAL | 0 refills | Status: DC

## 2023-05-17 MED ORDER — METHYLPREDNISOLONE SODIUM SUCC 125 MG IJ SOLR
125.0000 mg | Freq: Once | INTRAMUSCULAR | Status: AC
  Administered 2023-05-17: 125 mg via INTRAMUSCULAR
  Filled 2023-05-17: qty 2

## 2023-05-17 MED ORDER — KETOROLAC TROMETHAMINE 30 MG/ML IJ SOLN
30.0000 mg | Freq: Once | INTRAMUSCULAR | Status: AC
  Administered 2023-05-17: 30 mg via INTRAMUSCULAR
  Filled 2023-05-17: qty 1

## 2023-05-17 NOTE — ED Provider Notes (Signed)
EMERGENCY DEPARTMENT AT [facility]  Provider Note  CSN: [id number] Arrival date & time: 05/17/23 4401  History Chief Complaint  Patient presents with   Back Pain    Patty Howard is a 67 y.o. female with history of chronic back pain, previously had a spinal stimulator but that was not effective and removed several years ago. She is followed by [facility]) and Pain Management. She reports 4 days of worsening, aching R lower back pain, denies radiation to me but reported radiation in triage. No bowel or bladder symptoms. Took oxycodone and tried lidocaine patch without relief.    Home Medications Prior to Admission medications   Medication Sig Start Date End Date Taking? Authorizing Provider  methylPREDNISolone (MEDROL DOSEPAK) 4 MG TBPK tablet Use as directed 05/17/23  Yes Pollyann Savoy, MD  tiZANidine (ZANAFLEX) 2 MG tablet Take 1 tablet (2 mg total) by mouth every 8 (eight) hours as needed for muscle spasms. 05/17/23  Yes Pollyann Savoy, MD  albuterol (PROVENTIL) (2.5 MG/3ML) 0.083% nebulizer solution Take 3 mLs (2.5 mg total) by nebulization every 6 (six) hours as needed for wheezing or shortness of breath. 08/03/22   Alfonse Spruce, MD  amLODipine (NORVASC) 5 MG tablet Take 1 tablet (5 mg total) by mouth daily. 06/18/22   Ronney Asters, NP  ascorbic acid (VITAMIN C) 500 MG tablet Take 500 mg by mouth daily.    [provider]  atorvastatin (LIPITOR) 20 MG tablet Take 1 tablet (20 mg total) by mouth daily. 06/18/22   Ronney Asters, NP  azithromycin (ZITHROMAX) 500 MG tablet Take 1 tablet (500 mg total) by mouth every Monday, Wednesday, and Friday. Patient not taking: Reported on 01/20/2023 10/09/22   Hetty Blend, FNP  furosemide (LASIX) 40 MG tablet Take 1 tablet (40 mg total) by mouth daily. 06/18/22   Ronney Asters, NP  ibuprofen (ADVIL) 800 MG tablet Take 1 tablet (800 mg total) by mouth every 8 (eight) hours as needed for moderate  pain. 02/17/22   [place], Buren Kos, MD  ipratropium-albuterol (DUONEB) 0.5-2.5 (3) MG/3ML SOLN Take 3 mLs by nebulization every 6 (six) hours as needed. Patient taking differently: Take 3 mLs by nebulization every 6 (six) hours as needed (Wheezing, shortness of breath). 04/23/21   Verlee Monte, MD  levocetirizine (XYZAL) 5 MG tablet Take 1 tablet (5 mg total) by mouth every evening. 10/08/22   Hetty Blend, FNP  levothyroxine (SYNTHROID) 100 MCG tablet Take 100 mcg by mouth daily before breakfast.    [provider]  LINZESS 290 MCG CAPS capsule Take 290 mcg by mouth daily. 03/17/22   [provider]  losartan-hydrochlorothiazide (HYZAAR) 100-25 MG tablet Take 1 tablet by mouth daily. 10/21/21   [facility], Roe Rutherford, PA  Multiple Vitamin (MULTIVITAMIN WITH MINERALS) TABS tablet Take 1 tablet by mouth daily. Centrum Multivitamin    [provider]  omeprazole (PRILOSEC) 40 MG capsule Take 1 capsule (40 mg total) by mouth daily. STOP PANTOPRAZOLE 02/03/23 03/17/23  Napoleon Form, MD  potassium chloride SA (KLOR-CON M) 20 MEQ tablet Take 1 tablet (20 mEq total) by mouth daily. 09/30/21 11/10/22  Dorcas Carrow, MD  PROAIR RESPICLICK [phone] Base) MCG/ACT AEPB Inhale 1-2 puffs into the lungs 4 (four) times daily as needed (and 15-20 minutes prior to exercise). 11/25/22   Alfonse Spruce, MD  Semaglutide, 1 MG/DOSE, 4 MG/3ML SOPN Inject 1 mg as directed once a week. Patient  taking differently: Inject 1 mg into the skin once a week. 02/17/22   [place], Buren Kos, MD  traMADol (ULTRAM) 50 MG tablet Take 1 tablet (50 mg total) by mouth every 12 (twelve) hours as needed. 02/27/22   Eldred Manges, MD  Vitamin D, Ergocalciferol, (DRISDOL) 1.25 MG (50000 UNIT) CAPS capsule Take 50,000 Units by mouth every Monday.  10/27/19   [provider]  zafirlukast (ACCOLATE) 20 MG tablet TAKE 1 TABLET (20 MG TOTAL) BY MOUTH 2 (TWO) TIMES DAILY BEFORE A MEAL. 12/28/22 06/26/23  Hetty Blend,  FNP  zolpidem (AMBIEN CR) 6.25 MG CR tablet Take 1 tablet (6.25 mg total) by mouth at bedtime as needed for sleep. 04/29/22   Tomma Lightning, MD     Allergies    Patient has no active allergies.   Review of Systems   Review of Systems Please see HPI for pertinent positives and negatives  Physical Exam BP 126/81   Pulse (!) 111   Temp 98 F (36.7 C) (Oral)   Resp 18   Ht 5\' 9"  (1.753 m)   Wt 108.9 kg   SpO2 100%   BMI 35.44 kg/m   Physical Exam Vitals and nursing note reviewed.  Constitutional:      Appearance: Normal appearance.     Comments: uncomfortable  HENT:     Head: Normocephalic and atraumatic.     Nose: Nose normal.     Mouth/Throat:     Mouth: Mucous membranes are moist.  Eyes:     Extraocular Movements: Extraocular movements intact.     Conjunctiva/sclera: Conjunctivae normal.  Cardiovascular:     Rate and Rhythm: Normal rate.  Pulmonary:     Effort: Pulmonary effort is normal.     Breath sounds: Normal breath sounds.  Abdominal:     General: Abdomen is flat.     Palpations: Abdomen is soft.     Tenderness: There is no abdominal tenderness.  Musculoskeletal:        General: Tenderness (R lumbar paraspinal muscles) present. No swelling. Normal range of motion.     Cervical back: Neck supple.  Skin:    General: Skin is warm and dry.  Neurological:     General: No focal deficit present.     Mental Status: She is alert and oriented to person, place, and time.     Sensory: No sensory deficit.     Motor: No weakness.  Psychiatric:        Mood and Affect: Mood normal.     ED Results / Procedures / Treatments   EKG None  Procedures Procedures  Medications Ordered in the ED Medications  ketorolac (TORADOL) 30 MG/ML injection 30 mg (30 mg Intramuscular Given 05/17/23 0312)  methylPREDNISolone sodium succinate (SOLU-MEDROL) 125 mg/2 mL injection 125 mg (125 mg Intramuscular Given 05/17/23 0312)    Initial Impression and Plan  Patient here  with low back pain, does not describe radicular symptoms to me. No Red flags. Was treated for Bursitis in Ortho office about 5 months ago. Will give Toradol, steroids and reassess. She drove herself here.   ED Course   Clinical Course as of 05/17/23 0359  [facility] May 17, 2023  1610 Patient feeling better, resting more comfortably. Recommend she continue oxycodone and lidoderm patches. Will give a course of medrol, robaxin. Recommend Ortho follow up if not improving. RTED for any other concerns. [CS]    Clinical Course User Index [CS] Pollyann Savoy, MD  MDM Rules/Calculators/A&P Medical Decision Making Problems Addressed: Chronic right-sided low back pain without sciatica: chronic illness or injury with exacerbation, progression, or side effects of treatment  Risk Prescription drug management.     Final Clinical Impression(s) / ED Diagnoses Final diagnoses:  Chronic right-sided low back pain without sciatica    Rx / DC Orders ED Discharge Orders          Ordered    methylPREDNISolone (MEDROL DOSEPAK) 4 MG TBPK tablet        05/17/23 0358    tiZANidine (ZANAFLEX) 2 MG tablet  Every 8 hours PRN        05/17/23 0358             Pollyann Savoy, MD 05/17/23 [phone]

## 2023-05-17 NOTE — ED Triage Notes (Signed)
Pt POV reporting severe lower back pain on the right side that shoots down leg. Hx of same, took oxycodone and applied lidocaine patches with no relief.

## 2023-05-18 ENCOUNTER — Ambulatory Visit: Payer: Medicare HMO

## 2023-05-18 ENCOUNTER — Encounter: Payer: Self-pay | Admitting: Allergy & Immunology

## 2023-05-18 ENCOUNTER — Ambulatory Visit: Payer: Medicare HMO | Admitting: Allergy & Immunology

## 2023-05-18 VITALS — BP 118/62 | HR 93 | Temp 99.4°F | Resp 10 | Ht 66.73 in | Wt 259.4 lb

## 2023-05-18 DIAGNOSIS — J4550 Severe persistent asthma, uncomplicated: Secondary | ICD-10-CM | POA: Diagnosis not present

## 2023-05-18 DIAGNOSIS — J3089 Other allergic rhinitis: Secondary | ICD-10-CM

## 2023-05-18 DIAGNOSIS — K219 Gastro-esophageal reflux disease without esophagitis: Secondary | ICD-10-CM | POA: Diagnosis not present

## 2023-05-18 DIAGNOSIS — D806 Antibody deficiency with near-normal immunoglobulins or with hyperimmunoglobulinemia: Secondary | ICD-10-CM | POA: Diagnosis not present

## 2023-05-18 MED ORDER — ALBUTEROL SULFATE HFA 108 (90 BASE) MCG/ACT IN AERS: 2.0000 | INHALATION_SPRAY | Freq: Four times a day (QID) | RESPIRATORY_TRACT | 2 refills | Status: DC | PRN

## 2023-05-18 MED ORDER — AZITHROMYCIN 500 MG PO TABS: 500.0000 mg | ORAL_TABLET | ORAL | 1 refills | Status: DC

## 2023-05-18 NOTE — Patient Instructions (Addendum)
1. Severe persistent asthma, uncomplicated - You sound SO GOOD today!  - Lung testing looks stable today.  - Daily controller medication(s): Breztri two puffs twice daily with spacer + zafirlukast 20mg  twice daily + Tezspire every month  - Prior to physical activity: albuterol 2 puffs 10-15 minutes before physical activity. - Rescue medications: albuterol 4 puffs every 4-6 hours as needed, albuterol nebulizer one vial every 4-6 hours as needed or DuoNeb nebulizer one vial every 4-6 hours as needed - Asthma control goals:  * Full participation in all desired activities (may need albuterol before activity) * Albuterol use two time or less a week on average (not counting use with activity) * Cough interfering with sleep two time or less a month * Oral steroids no more than once a year * No hospitalizations  2. Chronic rhinitis (dust mites, molds) - Continue with as needed Flonase one spray per nostril twice daily   3. 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 - This is likely going to be a lifetime treatment.  - I think we can hold off on immunoglobulin infusions at this point.   4. Gastroesophageal reflux disease  - Continue with Protonix 40mg  once daily to control any silent reflux.  5. Return in about 4 months (around 09/16/2023). You can have the follow up appointment with Dr. Dellis Anes or a Nurse Practicioner (our Nurse Practitioners are excellent and always have Physician oversight!).    Please inform us of any Emergency Department visits, hospitalizations, or changes in symptoms. Call us before going to the ED for breathing or allergy symptoms since we might be able to fit you in for a sick visit. Feel free to contact us anytime with any questions, problems, or concerns.  It was a pleasure to see you again today!  Websites that have reliable patient information: 1. American Academy of  Asthma, Allergy, and Immunology: www.aaaai.org 2. Food Allergy Research and Education (FARE): foodallergy.org 3. Mothers of Asthmatics: http://www.asthmacommunitynetwork.org 4. American College of Allergy, Asthma, and Immunology: www.acaai.org      "Like" [place] on Facebook and Instagram for our latest updates!      A healthy democracy works best when [employer] participate! Make sure you are registered to vote! If you have moved or changed any of your contact information, you will need to get this updated before voting! Scan the QR codes below to learn more!

## 2023-05-18 NOTE — Progress Notes (Signed)
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   Plan/Recommendations:    1. Severe persistent asthma, uncomplicated - You sound SO GOOD today!  - Lung testing looks stable today.  - Daily controller medication(s): Breztri two puffs twice daily with spacer + zafirlukast 20mg  twice daily + Tezspire every month  - Prior to physical activity: albuterol 2 puffs 10-15 minutes before physical activity. - Rescue medications: albuterol 4 puffs every 4-6 hours as needed, albuterol nebulizer one vial every 4-6 hours as needed or DuoNeb nebulizer one vial every 4-6 hours as needed - Asthma control goals:  * Full participation in all desired activities (may need albuterol before activity) * Albuterol use two time or less a week on average (not counting use with activity) * Cough interfering with sleep two time or less a month * Oral steroids no more than once a year * No hospitalizations  2. Chronic rhinitis (dust mites, molds) - Continue with as needed Flonase one spray per nostril twice daily   3. 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 - This is likely going to be a lifetime treatment.  - I think we can hold off on immunoglobulin infusions at this point.   4. Gastroesophageal reflux disease  - Continue with Protonix 40mg  once daily to control any silent reflux.  5. Return in about 4 months (around 09/16/2023). You can have the follow up appointment  with Dr. Dellis Anes or a Nurse Practicioner (our Nurse Practitioners are excellent and always have Physician oversight!).   Subjective:   Patty Howard is a 67 y.o. female presenting today for follow up of  Chief Complaint  Patient presents with   Asthma    Says she's still having struggles with her asthma but that it's not as bad as it was.   Allergic Rhinitis     Is wondering if there's any allergy testing that needs to be done, but states that it's been doing better than it has been.    Patty Howard has a history of the following: 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) 10/08/2022   Perennial allergic rhinitis 10/08/2022   COPD exacerbation (HCC) 04/18/2022   Right hip pain 02/17/2022   Cough 12/05/2021   Type 2 diabetes mellitus without complication, without long-term current use of insulin (HCC) 11/05/2021   Trigger finger, left middle finger 10/02/2021   COPD with acute exacerbation (HCC) 09/28/2021   Acute exacerbation of COPD with asthma (HCC) 09/27/2021   Other intervertebral disc degeneration, lumbar region 07/15/2021   History of total hip arthroplasty, left 03/07/2021   Trochanteric bursitis, left hip 12/23/2020   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   Diastolic dysfunction 11/25/2020   Asthma exacerbation 11/13/2020   Menopause 11/08/2020   Medication management 04/30/2020   Gastroesophageal reflux disease 03/14/2020   OSA (obstructive sleep apnea) 02/15/2020   Abnormal findings on diagnostic imaging of lung 01/08/2020   Educated about COVID-19 virus infection 11/30/2019   Aortic  atherosclerosis (HCC) 11/30/2019   Chest pain 11/26/2019   Acute asthma exacerbation 05/22/2019   Left radial head fracture 02/03/2019   Closed fracture of radial head 01/29/2019   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) (HCC) 10/19/2018   Insomnia 10/19/2018   Elbow pain, chronic, left 10/19/2018   Dyspnea 10/12/2017   Pancolitis (HCC) 05/20/2017   H/O Spinal surgery 01/18/2015   Essential hypertension 01/18/2015   Obesity 01/18/2015    History obtained from: chart review and patient.  Discussed the use of AI scribe software for clinical note transcription with the patient and/or guardian, who gave verbal consent to proceed.  Patty Howard is a 67 y.o. female presenting for a follow up visit.  She was last seen in April 2024.  At that time, she was doing well on Breztri as well as the feel it 20 mg twice daily and Tezspire monthly.  For her rhinitis, she was continued on Flonase as well as Xyzal.  She was also continued on azithromycin 3 times a week for her specific antibody deficiency.  Her GERD was controlled with Protonix 40 mg daily.  Since the last visit, she has done very well.     History of Present Illness     They also use Breztri for their asthma and have recently started on Ozempic (semaglutide) for diabetes, which has resulted in improved blood sugar control and some weight loss.   Asthma/Respiratory Symptom History: [name] reports a recent respiratory illness, which resolved without antibiotics, using Mucinex. The patient also mentioned a recent visit to the ER due to severe back pain, which was managed with lidocaine patches, a tramadol injection, and a course of prednisone. The patient has a history of hip surgery, which seems to have initiated their chronic back pain. She remains on the Tezspire which seems to be controlling her symptoms very well. She has not been to the hospital for her symptoms and has overall done very well.  She remains on the Breztri two  puffs BID.   Allergic Rhinitis Symptom History: The patient has been stable with no recent need for additional antibiotics. The patient has not been taking Xyzal (levocetirizine), an allergy medication, as they do not experience symptoms of allergies. She has a lot of Flonase at home, but does not use this on a routine basis at all.  The patient is on a regimen of azithromycin three times a week, which they have been off for about three weeks due to a lapse in prescription refill. While on this medication, she has not needed breakthrough antibiotics at all for her symptoms.   The patient has a history of hip replacement surgery and has been advised that they may need a similar procedure on the other side. They are currently trying to avoid this.   Otherwise, there have been no changes to her past medical history, surgical history, family history, or social history.    Review of systems otherwise negative other than that mentioned in the HPI.    Objective:   Blood pressure 118/62, pulse 93, temperature 99.4 F (37.4 C), temperature source Temporal, resp. rate 10, height 5' 6.73" (1.695 m), weight 259 lb 6.4 oz (117.7  kg), SpO2 96%. Body mass index is 40.95 kg/m.    Physical Exam Vitals reviewed.  Constitutional:      Appearance: She is well-developed.     Comments: Speaking in full sentences without a problem. Talkative and thankful.   HENT:     Head: Normocephalic and atraumatic.     Right Ear: Tympanic membrane, ear canal and external ear normal.     Left Ear: Tympanic membrane, ear canal and external ear normal.     Nose: No nasal deformity, septal deviation, mucosal edema or rhinorrhea.     Right Turbinates: Pale. Not enlarged or swollen.     Left Turbinates: Pale. Not enlarged or swollen.     Right Sinus: No maxillary sinus tenderness or frontal sinus tenderness.     Left Sinus: No maxillary sinus tenderness or frontal sinus tenderness.     Comments: No epistaxis noted.      Mouth/Throat:     Lips: Pink. No lesions.     Mouth: Mucous membranes are moist. Mucous membranes are not pale and not dry.     Pharynx: Uvula midline.     Comments: Mild cobblestoning present in the posterior oropharynx. Eyes:     General: Lids are normal. No allergic shiner.       Right eye: No discharge.        Left eye: No discharge.     Conjunctiva/sclera: Conjunctivae normal.     Right eye: Right conjunctiva is not injected. No chemosis.    Left eye: Left conjunctiva is not injected. No chemosis.    Pupils: Pupils are equal, round, and reactive to light.  Cardiovascular:     Rate and Rhythm: Normal rate and regular rhythm.     Heart sounds: Normal heart sounds.  Pulmonary:     Effort: Pulmonary effort is normal. No tachypnea, accessory muscle usage or respiratory distress.     Breath sounds: Wheezing present. No rhonchi or rales.     Comments: Moving air well in all lung fields.  No increased work of breathing.   Chest:     Chest wall: No tenderness.  Lymphadenopathy:     Cervical: No cervical adenopathy.  Skin:    General: Skin is warm.     Capillary Refill: Capillary refill takes less than 2 seconds.     Coloration: Skin is not pale.     Findings: No abrasion, erythema, petechiae or rash. Rash is not papular, urticarial or vesicular.  Neurological:     Mental Status: She is alert.  Psychiatric:        Behavior: Behavior is cooperative.      Diagnostic studies:    Spirometry: results normal (FEV1: 1.38/59%, FVC: 1.83/61%, FEV1/FVC: 75%).    Spirometry consistent with possible restrictive disease. This is overall stable compared to previous visits.   Allergy Studies: none       Malachi Bonds, MD  [facility]

## 2023-05-19 ENCOUNTER — Encounter: Payer: Self-pay | Admitting: Physical Medicine and Rehabilitation

## 2023-05-19 ENCOUNTER — Ambulatory Visit: Payer: Medicare HMO | Admitting: Physical Medicine and Rehabilitation

## 2023-05-19 DIAGNOSIS — M5416 Radiculopathy, lumbar region: Secondary | ICD-10-CM

## 2023-05-19 DIAGNOSIS — M48062 Spinal stenosis, lumbar region with neurogenic claudication: Secondary | ICD-10-CM | POA: Diagnosis not present

## 2023-05-19 DIAGNOSIS — M47816 Spondylosis without myelopathy or radiculopathy, lumbar region: Secondary | ICD-10-CM

## 2023-05-19 IMAGING — DX DG CHEST 1V PORT
2 series · 2 of 2 positions shown · non-contrast
Comparison: 10/10/2020

CLINICAL DATA: Cough and shortness of breath

EXAM:
PORTABLE CHEST 1 VIEW

[chest ap (1 of 2)]
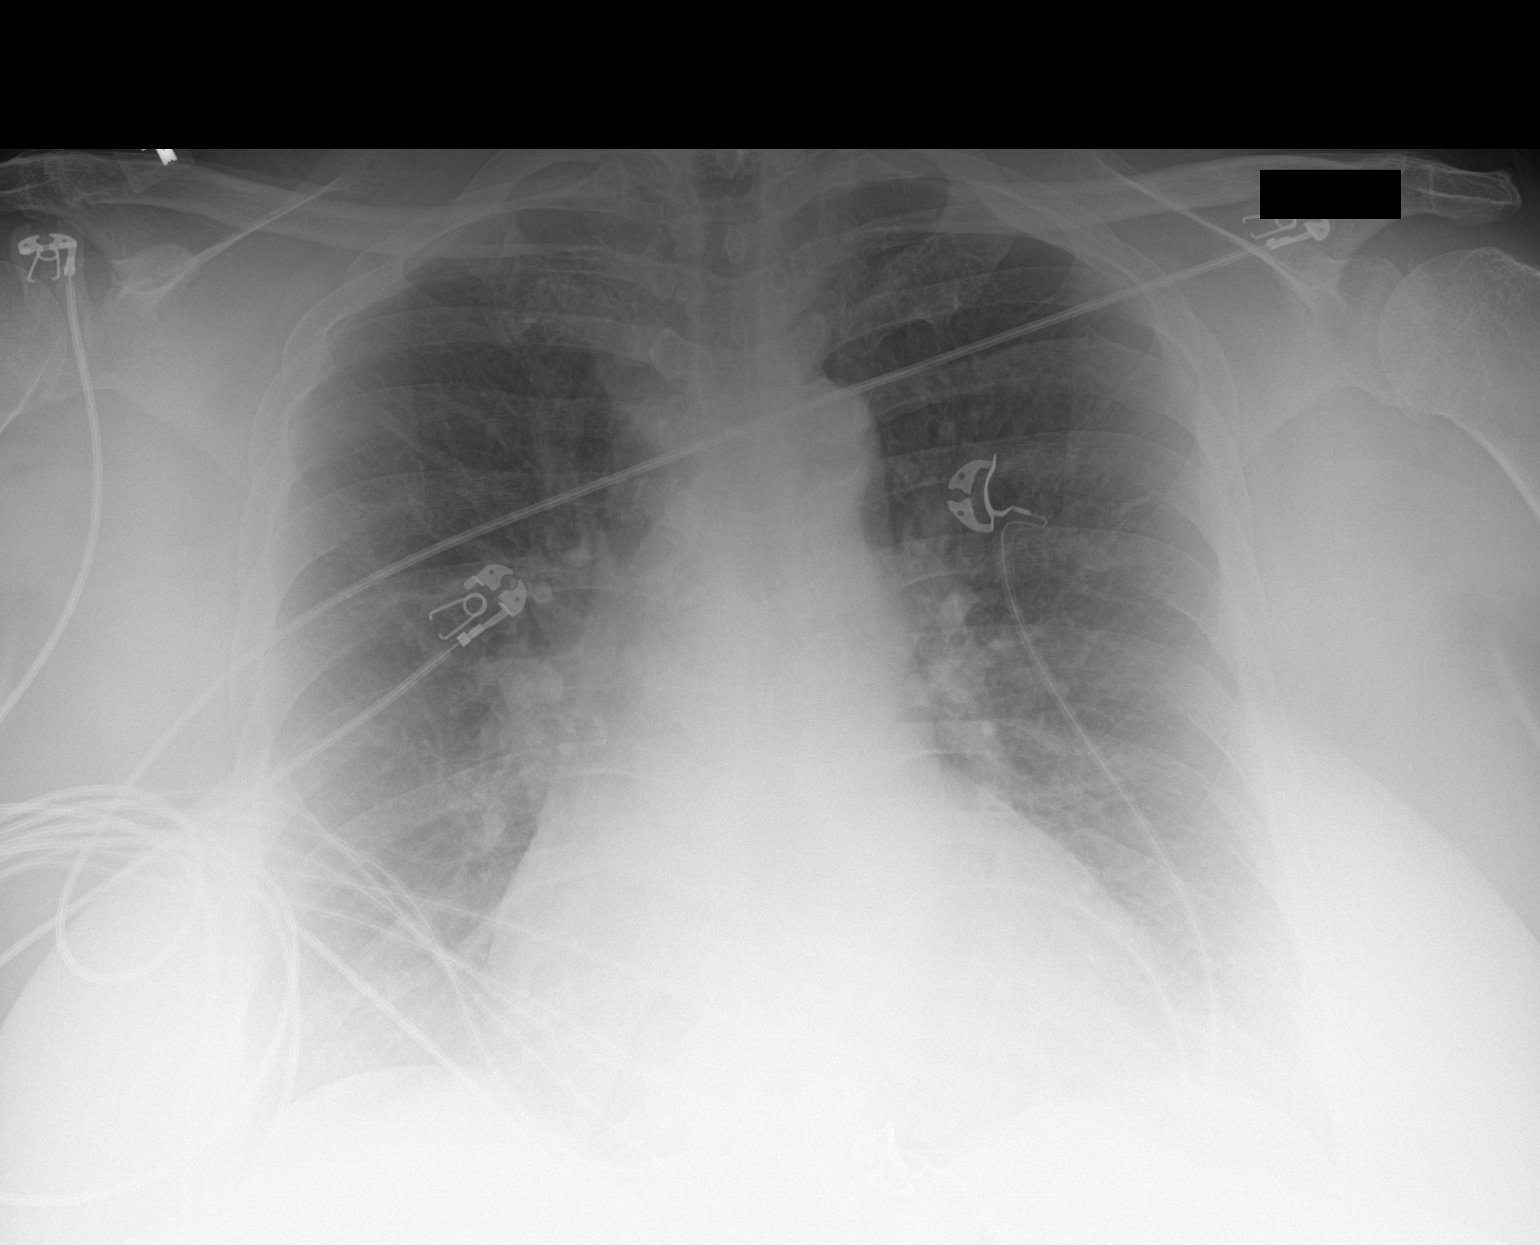

[chest ap (2 of 2)]
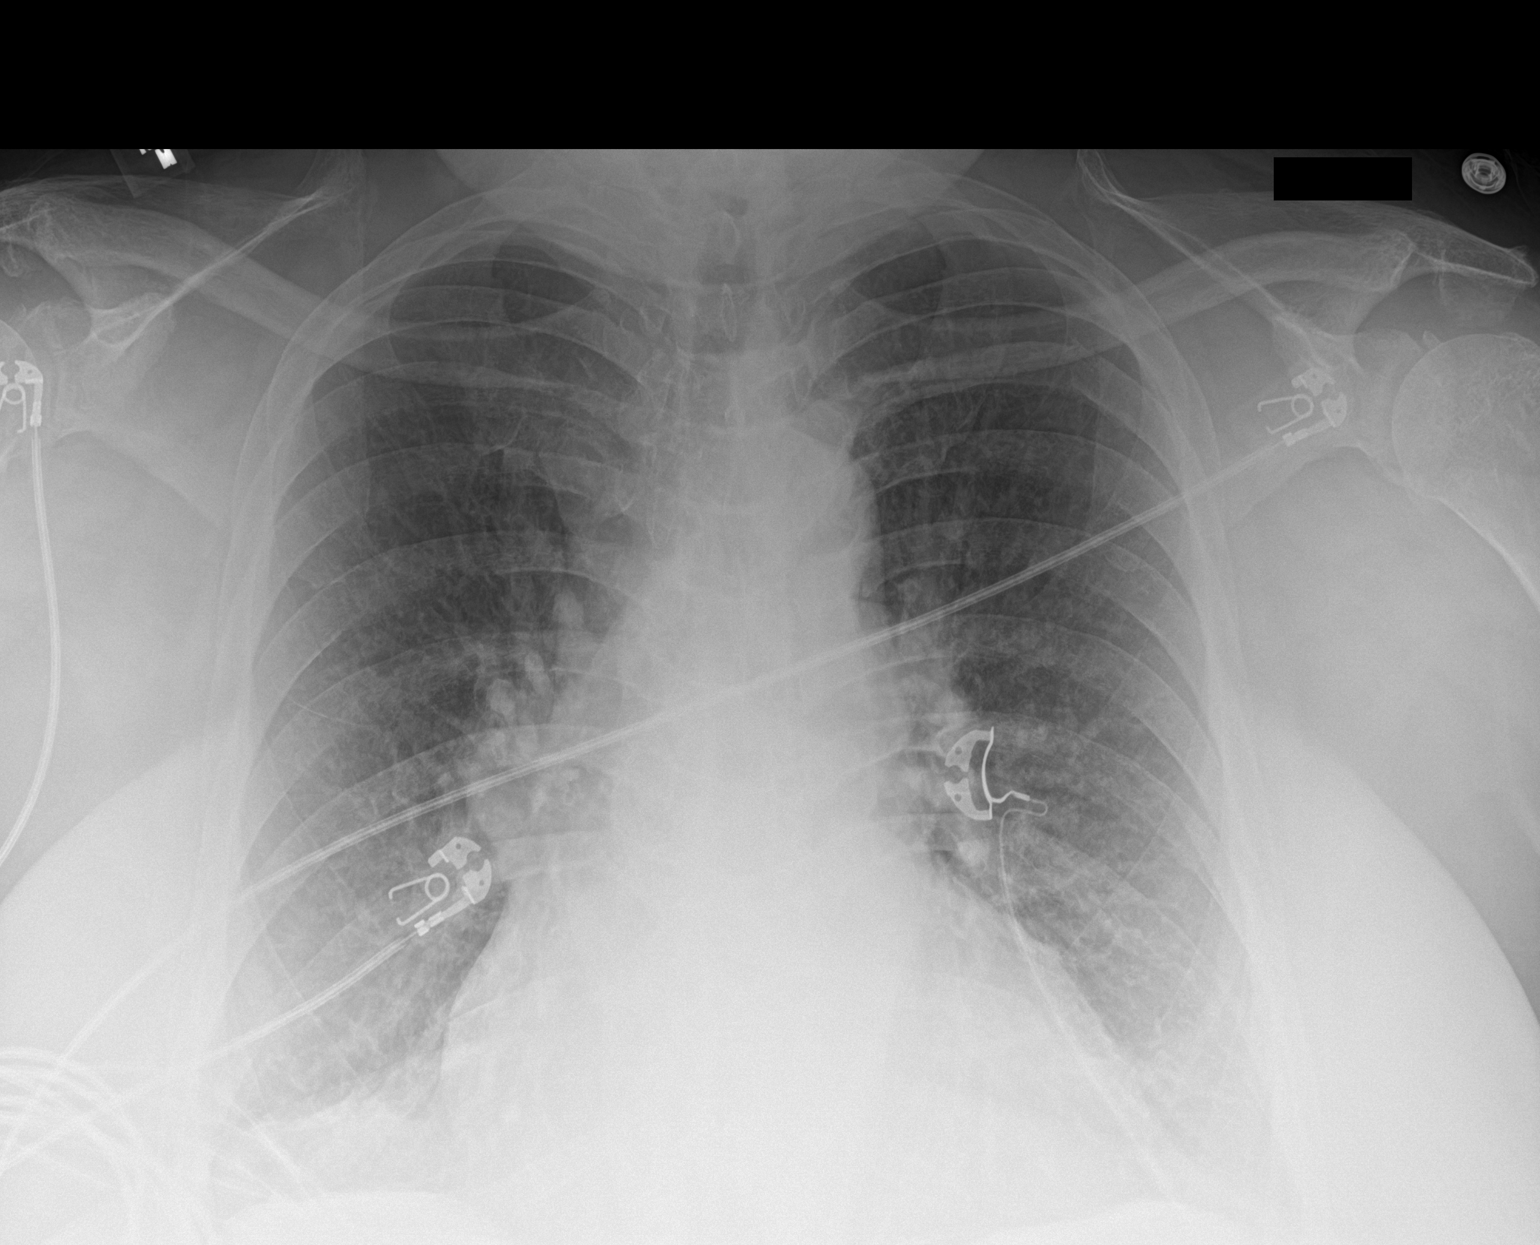

[2 of 2 positions shown; findings below may reference images not displayed]

FINDINGS: Borderline heart size accentuated by technique. Stable mediastinal
contours. There is no edema, consolidation, effusion, or
pneumothorax. Artifact from EKG leads.
IMPRESSION: No acute or interval finding.

## 2023-05-19 MED ORDER — DIAZEPAM 5 MG PO TABS: ORAL_TABLET | ORAL | 0 refills | Status: DC

## 2023-05-19 NOTE — Progress Notes (Signed)
Patient was seen at the ER she received a IM injection of Toradol. Along with a dose pack she stated this die relieve some pain but didn't take it away. She stated it is running down her R leg. Getting up and moving is painful she prefers to stay in bed. No injury that she can think of. This came on suddenly.

## 2023-05-19 NOTE — Progress Notes (Signed)
Patty Howard - 67 y.o. female MRN [id number]  Date of birth: [birth year]  Office Visit Note: Visit Date: 05/19/2023 PCP: Melvenia Beam, MD Referred by: Melvenia Beam,*  Subjective: Chief Complaint  Patient presents with   Lower Back - Pain   HPI: Patty Howard is a 67 y.o. female who comes in today for evaluation of chronic, worsening and severe right sided lower back pain radiating to buttock, hip and down right leg. Pain ongoing for several years, her pain worsened several weeks ago. Her pain is severe with prolonged walking and standing. She describes her pain as sharp and stabbing sensation, currently rates as 8 out of 10. She was recently seen in the emergency department on 05/17/2023, good short term relief of pain with intramuscular injection of Toradol and Solu-Medrol. Some relief of pain with home exercise regimen, rest and use of medications. She is currently undergoing chronic pain management with Dr. Verdon Cummins with [facility], she is prescribed 90 tablets of 5 mg Oxycodone IR monthly. Some relief of pain with Lidocaine patches. History of formal physical therapy while living in [place] several years ago, no relief of pain with treatments. She also reports history of spinal cord stimulator placement, was removed after 1 year due to minimal relief of pain. Patients lumbar MRI imaging from 2022 exhibits marked facet arthropathy and spinal canal stenosis at the level of L4-L5, there is also moderate spinal canal stenosis at L3-L4. Patient underwent right L4-L5 interlaminar epidural steroid injection in our office on 02/11/2022, she reports greater than 80% relief of pain with this injection for 6 months. Patient currently using cane to ambulate, she denies focal weakness numbness and tingling. No recent trauma or falls.      Review of Systems  Musculoskeletal:  Positive for back pain and myalgias.  Neurological:  Negative for tingling, sensory  change, focal weakness and weakness.  All other systems reviewed and are negative.  Otherwise per HPI.  Assessment & Plan: Visit Diagnoses:    ICD-10-CM   1. Lumbar radiculopathy  M54.16 Ambulatory referral to Physical Medicine Rehab    2. Spinal stenosis of lumbar region with neurogenic claudication  M48.062 Ambulatory referral to Physical Medicine Rehab    3. Facet arthropathy, lumbar  M47.816 Ambulatory referral to Physical Medicine Rehab       Plan: Findings:  Chronic, worsening and severe right sided lower back pain radiating to buttock, hip and down right leg.  Patient continues to have severe pain despite good conservative therapy such as formal physical therapy, home exercise regimen, rest and use of medications.  Patient's clinical presentation and exam are consistent with neurogenic claudication as a result of spinal canal stenosis.  There is moderate spinal canal stenosis at the level of L4-L5, moderate spinal canal stenosis at the level above at L3-L4.  Next step is to perform diagnostic and hopefully therapeutic right L4-L5 interlaminar epidural steroid injection under fluoroscopic guidance.  If good relief of pain we can repeat this procedure infrequently as needed.  Patient did voice issues with anxiety related to injection procedures, I prescribed pre-procedure Valium for her to take on the day of injection.  Patient instructed to continue chronic pain management with [facility]. Should her pain persist I would recommend consult with our spine surgeon Dr. Willia Craze to discuss options. Patient has no questions at this time. No red flag symptoms noted upon exam today.   Screening for Osteoporosis for Women Aged 49-7 Years of Age:  Patient has had a central dual-energy X-ray absorptiometry (DXA) to check for osteoporosis.   Today's note sent to PCP on record. Patient does not need referral to [facility] osteoporosis clinic.      Meds & Orders:  Meds ordered this  encounter  Medications   diazepam (VALIUM) 5 MG tablet    Sig: Take one tablet by mouth with food one hour prior to procedure. May repeat 30 minutes prior if needed.    Dispense:  2 tablet    Refill:  0    Orders Placed This Encounter  Procedures   Ambulatory referral to Physical Medicine Rehab    Follow-up: Return for Right L4-L5 interlaminar epidural steroid injection.   Procedures: No procedures performed      Clinical History: EXAM: MRI LUMBAR SPINE WITHOUT AND WITH CONTRAST   TECHNIQUE: Multiplanar and multiecho pulse sequences of the lumbar spine were obtained without and with intravenous contrast.   CONTRAST:  20mL MULTIHANCE GADOBENATE DIMEGLUMINE 529 MG/ML IV SOLN   COMPARISON:  None.   FINDINGS: Segmentation:  Standard.   Alignment:  Trace anterolisthesis at L4-L5 and L5-S1.   Vertebrae: Vertebral body heights are maintained. There is no substantial marrow edema. No suspicious osseous lesion.   Conus medullaris and cauda equina: Conus extends to the T12-L1 level. Conus and cauda equina appear normal.   Paraspinal and other soft tissues: Unremarkable.   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   L1-L2: Prominent epidural fat mildly flattening the thecal sac. No foraminal stenosis.   L2-L3: Small left foraminal protrusion. Mild facet arthropathy. Prominent dorsal epidural fat mildly flattening the thecal sac. No right foraminal stenosis. Minor left foraminal stenosis.   L3-L4: Disc bulge. Marked right and mild left facet arthropathy with ligamentum flavum infolding. Moderate canal stenosis. Narrowing of the subarticular recesses. Mild foraminal stenosis.   L4-L5: Disc bulge. Marked facet arthropathy with prominent hypertrophic changes and ligamentum flavum infolding. Marked canal stenosis. Narrowing of the subarticular recesses. Mild right foraminal stenosis. No left foraminal stenosis.   L5-S1: Disc bulge. Marked facet arthropathy  with prominent hypertrophic changes and ligamentum flavum infolding. Minor canal stenosis. Mild foraminal stenosis.   IMPRESSION: Multilevel degenerative changes as detailed above. Canal stenosis is greatest L4-L5. There is marked lower lumbar facet arthropathy.     Electronically Signed   By: Guadlupe Spanish M.D.   On: 10/14/2020 10:29   She reports that she quit smoking about 24 years ago. Her smoking use included cigarettes. She started smoking about 34 years ago. She has a 5 pack-year smoking history. She has been exposed to tobacco smoke. She has never used smokeless tobacco. No results for input(s): "HGBA1C", "LABURIC" in the last 8760 hours.  Objective:  VS:  HT:    WT:   BMI:     BP:   HR: bpm  TEMP: ( )  RESP:  Physical Exam Vitals and nursing note reviewed.  HENT:     Head: Normocephalic and atraumatic.     Right Ear: External ear normal.     Left Ear: External ear normal.     Nose: Nose normal.     Mouth/Throat:     Mouth: Mucous membranes are moist.  Eyes:     Extraocular Movements: Extraocular movements intact.  Cardiovascular:     Rate and Rhythm: Normal rate.     Pulses: Normal pulses.  Pulmonary:     Effort: Pulmonary effort is normal.  Abdominal:     General: Abdomen is flat. There  is no distension.  Musculoskeletal:        General: Tenderness present.     Cervical back: Normal range of motion.     Comments: Patient is slow to rises from seated position to standing without difficulty. Good lumbar range of motion. No pain noted with facet loading. 5/5 strength noted with bilateral hip flexion, knee flexion/extension, ankle dorsiflexion/plantarflexion and EHL. No clonus noted bilaterally. No pain upon palpation of greater trochanters. No pain with internal/external rotation of bilateral hips. Sensation intact bilaterally. Negative slump test bilaterally. Ambulates with cane, gait slow and unsteady.  Skin:    General: Skin is warm and dry.     Capillary  Refill: Capillary refill takes less than 2 seconds.  Neurological:     Mental Status: She is alert and oriented to person, place, and time.     Gait: Gait abnormal.  Psychiatric:        Mood and Affect: Mood normal.        Behavior: Behavior normal.     Ortho Exam  Imaging: No results found.  Past Medical/Family/Surgical/Social History: Medications & Allergies reviewed per EMR, new medications updated. 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) 10/08/2022   Perennial allergic rhinitis 10/08/2022   COPD exacerbation (HCC) 04/18/2022   Right hip pain 02/17/2022   Cough 12/05/2021   Type 2 diabetes mellitus without complication, without long-term current use of insulin (HCC) 11/05/2021   Trigger finger, left middle finger 10/02/2021   COPD with acute exacerbation (HCC) 09/28/2021   Acute exacerbation of COPD with asthma (HCC) 09/27/2021   Other intervertebral disc degeneration, lumbar region 07/15/2021   History of total hip arthroplasty, left 03/07/2021   Trochanteric bursitis, left hip 12/23/2020   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   Diastolic dysfunction 11/25/2020   Asthma exacerbation 11/13/2020   Menopause 11/08/2020   Medication management 04/30/2020   Gastroesophageal reflux disease 03/14/2020   OSA (obstructive sleep apnea) 02/15/2020   Abnormal findings on diagnostic imaging of lung 01/08/2020   Educated about COVID-19 virus infection 11/30/2019   Aortic atherosclerosis (HCC) 11/30/2019   Chest pain 11/26/2019   Acute asthma exacerbation 05/22/2019   Left radial head fracture 02/03/2019   Closed fracture  of radial head 01/29/2019   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) (HCC) 10/19/2018   Insomnia 10/19/2018   Elbow pain, chronic, left 10/19/2018   Dyspnea 10/12/2017   Pancolitis (HCC) 05/20/2017   H/O Spinal surgery 01/18/2015   Essential hypertension 01/18/2015   Obesity 01/18/2015   Past Medical History:  Diagnosis Date   Anxiety    Arthritis    Asthma    COPD (chronic obstructive pulmonary disease) (HCC)    Fracture    closed displaced left radial head   GERD (gastroesophageal reflux disease)    Hypertension    Sleep apnea    does not wear CPAP   Family History  Problem Relation Age of Onset   Hypertension Mother    Stroke Mother    Lung cancer Father    Colon cancer Neg Hx    Stomach cancer Neg Hx    Pancreatic cancer Neg Hx    Past Surgical History:  Procedure  Laterality Date   BACK SURGERY     spinal stimulator   BIOPSY  11/10/2022   Procedure: BIOPSY;  Surgeon: Napoleon Form, MD;  Location: [facility];  Service: Gastroenterology;;   BREAST BIOPSY Right 2017   benign   COLONOSCOPY W/ BIOPSIES AND POLYPECTOMY     COLONOSCOPY WITH PROPOFOL N/A 11/10/2022   Procedure: COLONOSCOPY WITH PROPOFOL;  Surgeon: Napoleon Form, MD;  Location: [facility];  Service: Gastroenterology;  Laterality: N/A;   DILATION AND CURETTAGE OF UTERUS     ESOPHAGOGASTRODUODENOSCOPY (EGD) WITH PROPOFOL N/A 11/10/2022   Procedure: ESOPHAGOGASTRODUODENOSCOPY (EGD) WITH PROPOFOL;  Surgeon: Napoleon Form, MD;  Location: [facility];  Service: Gastroenterology;  Laterality: N/A;   LAPAROSCOPIC ROUX-EN-Y GASTRIC BYPASS WITH UPPER ENDOSCOPY AND REMOVAL OF LAP BAND     POLYPECTOMY  11/10/2022   Procedure: POLYPECTOMY;  Surgeon: Napoleon Form, MD;  Location: [facility];  Service: Gastroenterology;;   RADIAL HEAD ARTHROPLASTY Left 02/03/2019   Procedure: LEFT RADIAL HEAD ARTHROPLASTY;  Surgeon: Eldred Manges, MD;  Location: [facility];  Service: Orthopedics;   Laterality: Left;  AXILLARY BLOCK VS BIER BLOCK   TOTAL HIP ARTHROPLASTY Left 01/22/2021   Procedure: LEFT TOTAL HIP ARTHROPLASTY ANTERIOR APPROACH;  Surgeon: Eldred Manges, MD;  Location: [facility];  Service: Orthopedics;  Laterality: Left;   TUBAL LIGATION     Social History   Occupational History   Not on file  Tobacco Use   Smoking status: Former    Current packs/day: 0.00    Average packs/day: 0.5 packs/day for 10.0 years (5.0 ttl pk-yrs)    Types: Cigarettes    Start date: 06/15/1988    Quit date: 06/15/1998    Years since quitting: 24.9    Passive exposure: Past   Smokeless tobacco: Never  Vaping Use   Vaping status: Never Used  Substance and Sexual Activity   Alcohol use: Never   Drug use: Never   Sexual activity: Yes

## 2023-05-28 ENCOUNTER — Telehealth: Payer: Self-pay | Admitting: Physical Medicine and Rehabilitation

## 2023-05-28 NOTE — Telephone Encounter (Signed)
The prior authorization has been obtained. Patty Howard has called the patient and gotten her scheduled with Dr. Alvester Morin.

## 2023-05-28 NOTE — Telephone Encounter (Signed)
Patient called advised she spoke with [employer] talked to Nida Boatman and was told to give this phone number and the the pre approval con be given over the phone. The number to contact Nida Boatman  is  [phone] [phone] The number to contact patient is [phone]

## 2023-05-28 NOTE — Telephone Encounter (Signed)
Patient called and said that you called her and set a date to come in but she needs to reschedule the appointment. CB#[phone]

## 2023-06-07 ENCOUNTER — Other Ambulatory Visit: Payer: Self-pay

## 2023-06-07 ENCOUNTER — Ambulatory Visit: Payer: Medicare HMO | Admitting: Physical Medicine and Rehabilitation

## 2023-06-07 VITALS — BP 138/84 | HR 85

## 2023-06-07 DIAGNOSIS — M5416 Radiculopathy, lumbar region: Secondary | ICD-10-CM

## 2023-06-07 MED ORDER — METHYLPREDNISOLONE ACETATE 40 MG/ML IJ SUSP
40.0000 mg | Freq: Once | INTRAMUSCULAR | Status: AC
  Administered 2023-06-07: 40 mg

## 2023-06-07 NOTE — Progress Notes (Signed)
Functional Pain Scale - descriptive words and definitions   Severe (9)  Cannot do any ADL's even with assistance can barely talk/unable to sleep and unable to use distraction. Severe range order  Average Pain 9   +Driver, -BT, -Dye Allergies.  Here for injection, LBP into rt leg.

## 2023-06-07 NOTE — Procedures (Signed)
Lumbar Epidural Steroid Injection - Interlaminar Approach with Fluoroscopic Guidance  Patient: Patty Howard      Date of Birth: 17-Jul-1955 MRN: [id number] PCP: Melvenia Beam, MD      Visit Date: 06/07/2023   Universal Protocol:     Consent Given By: the patient  Position: PRONE  Additional Comments: Vital signs were monitored before and after the procedure. Patient was prepped and draped in the usual sterile fashion. The correct patient, procedure, and site was verified.   Injection Procedure Details:   Procedure diagnoses: Lumbar radiculopathy [M54.16]   Meds Administered:  Meds ordered this encounter  Medications   methylPREDNISolone acetate (DEPO-MEDROL) injection 40 mg     Laterality: Right  Location/Site:  L4-5  Needle: 4.5 in., 20 ga. Tuohy  Needle Placement: Paramedian epidural  Findings:   -Comments: Excellent flow of contrast into the epidural space.  Procedure Details: Using a paramedian approach from the side mentioned above, the region overlying the inferior lamina was localized under fluoroscopic visualization and the soft tissues overlying this structure were infiltrated with 4 ml. of 1% Lidocaine without Epinephrine. The Tuohy needle was inserted into the epidural space using a paramedian approach.   The epidural space was localized using loss of resistance along with counter oblique bi-planar fluoroscopic views.  After negative aspirate for air, blood, and CSF, a 2 ml. volume of Isovue-250 was injected into the epidural space and the flow of contrast was observed. Radiographs were obtained for documentation purposes.    The injectate was administered into the level noted above.   Additional Comments:  No complications occurred Dressing: 2 x 2 sterile gauze and Band-Aid    Post-procedure details: Patient was observed during the procedure. Post-procedure instructions were reviewed.  Patient left the clinic in stable condition.

## 2023-06-07 NOTE — Progress Notes (Signed)
Patty Howard - 67 y.o. female MRN [id number]  Date of birth: 07-25-1955  Office Visit Note: Visit Date: 06/07/2023 PCP: Melvenia Beam, MD Referred by: Melvenia Beam,*  Subjective: No chief complaint on file.  HPI:  Patty Howard is a 67 y.o. female who comes in today at the request of [name], FNP for planned Right L4-5 Lumbar Interlaminar epidural steroid injection with fluoroscopic guidance.  The patient has failed conservative care including home exercise, medications, time and activity modification.  This injection will be diagnostic and hopefully therapeutic.  Please see requesting physician notes for further details and justification.   ROS Otherwise per HPI.  Assessment & Plan: Visit Diagnoses:    ICD-10-CM   1. Lumbar radiculopathy  M54.16 XR C-ARM NO REPORT    Epidural Steroid injection    methylPREDNISolone acetate (DEPO-MEDROL) injection 40 mg      Plan: No additional findings.   Meds & Orders:  Meds ordered this encounter  Medications   methylPREDNISolone acetate (DEPO-MEDROL) injection 40 mg    Orders Placed This Encounter  Procedures   XR C-ARM NO REPORT   Epidural Steroid injection    Follow-up: Return for visit to requesting provider as needed.   Procedures: No procedures performed  Lumbar Epidural Steroid Injection - Interlaminar Approach with Fluoroscopic Guidance  Patient: Patty Howard      Date of Birth: 1956/06/15 MRN: [id number] PCP: Melvenia Beam, MD      Visit Date: 06/07/2023   Universal Protocol:     Consent Given By: the patient  Position: PRONE  Additional Comments: Vital signs were monitored before and after the procedure. Patient was prepped and draped in the usual sterile fashion. The correct patient, procedure, and site was verified.   Injection Procedure Details:   Procedure diagnoses: Lumbar radiculopathy [M54.16]   Meds Administered:  Meds ordered this encounter   Medications   methylPREDNISolone acetate (DEPO-MEDROL) injection 40 mg     Laterality: Right  Location/Site:  L4-5  Needle: 4.5 in., 20 ga. Tuohy  Needle Placement: Paramedian epidural  Findings:   -Comments: Excellent flow of contrast into the epidural space.  Procedure Details: Using a paramedian approach from the side mentioned above, the region overlying the inferior lamina was localized under fluoroscopic visualization and the soft tissues overlying this structure were infiltrated with 4 ml. of 1% Lidocaine without Epinephrine. The Tuohy needle was inserted into the epidural space using a paramedian approach.   The epidural space was localized using loss of resistance along with counter oblique bi-planar fluoroscopic views.  After negative aspirate for air, blood, and CSF, a 2 ml. volume of Isovue-250 was injected into the epidural space and the flow of contrast was observed. Radiographs were obtained for documentation purposes.    The injectate was administered into the level noted above.   Additional Comments:  No complications occurred Dressing: 2 x 2 sterile gauze and Band-Aid    Post-procedure details: Patient was observed during the procedure. Post-procedure instructions were reviewed.  Patient left the clinic in stable condition.   Clinical History: EXAM: MRI LUMBAR SPINE WITHOUT AND WITH CONTRAST   TECHNIQUE: Multiplanar and multiecho pulse sequences of the lumbar spine were obtained without and with intravenous contrast.   CONTRAST:  20mL MULTIHANCE GADOBENATE DIMEGLUMINE 529 MG/ML IV SOLN   COMPARISON:  None.   FINDINGS: Segmentation:  Standard.   Alignment:  Trace anterolisthesis at L4-L5 and L5-S1.   Vertebrae: Vertebral body heights are maintained. There is no substantial marrow  edema. No suspicious osseous lesion.   Conus medullaris and cauda equina: Conus extends to the T12-L1 level. Conus and cauda equina appear normal.   Paraspinal and  other soft tissues: Unremarkable.   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   L1-L2: Prominent epidural fat mildly flattening the thecal sac. No foraminal stenosis.   L2-L3: Small left foraminal protrusion. Mild facet arthropathy. Prominent dorsal epidural fat mildly flattening the thecal sac. No right foraminal stenosis. Minor left foraminal stenosis.   L3-L4: Disc bulge. Marked right and mild left facet arthropathy with ligamentum flavum infolding. Moderate canal stenosis. Narrowing of the subarticular recesses. Mild foraminal stenosis.   L4-L5: Disc bulge. Marked facet arthropathy with prominent hypertrophic changes and ligamentum flavum infolding. Marked canal stenosis. Narrowing of the subarticular recesses. Mild right foraminal stenosis. No left foraminal stenosis.   L5-S1: Disc bulge. Marked facet arthropathy with prominent hypertrophic changes and ligamentum flavum infolding. Minor canal stenosis. Mild foraminal stenosis.   IMPRESSION: Multilevel degenerative changes as detailed above. Canal stenosis is greatest L4-L5. There is marked lower lumbar facet arthropathy.     Electronically Signed   By: Guadlupe Spanish M.D.   On: 10/14/2020 10:29     Objective:  VS:  HT:    WT:   BMI:     BP:   HR: bpm  TEMP: ( )  RESP:  Physical Exam Vitals and nursing note reviewed.  Constitutional:      General: She is not in acute distress.    Appearance: Normal appearance. She is obese. She is not ill-appearing.  HENT:     Head: Normocephalic and atraumatic.     Right Ear: External ear normal.     Left Ear: External ear normal.  Eyes:     Extraocular Movements: Extraocular movements intact.  Cardiovascular:     Rate and Rhythm: Normal rate.     Pulses: Normal pulses.  Pulmonary:     Effort: Pulmonary effort is normal. No respiratory distress.  Abdominal:     General: There is no distension.     Palpations: Abdomen is soft.  Musculoskeletal:         General: Tenderness present.     Cervical back: Neck supple.     Right lower leg: No edema.     Left lower leg: No edema.     Comments: Patient has good distal strength with no pain over the greater trochanters.  No clonus or focal weakness.  Skin:    Findings: No erythema, lesion or rash.  Neurological:     General: No focal deficit present.     Mental Status: She is alert and oriented to person, place, and time.     Sensory: No sensory deficit.     Motor: No weakness or abnormal muscle tone.     Coordination: Coordination normal.  Psychiatric:        Mood and Affect: Mood normal.        Behavior: Behavior normal.      Imaging: No results found.

## 2023-06-07 NOTE — Patient Instructions (Signed)

## 2023-06-10 ENCOUNTER — Ambulatory Visit
Admission: RE | Admit: 2023-06-10 | Discharge: 2023-06-10 | Disposition: A | Discharge status: Home / Self Care | Payer: Medicare HMO | Source: Ambulatory Visit | Attending: Internal Medicine | Admitting: Internal Medicine

## 2023-06-10 DIAGNOSIS — Z1231 Encounter for screening mammogram for malignant neoplasm of breast: Secondary | ICD-10-CM

## 2023-06-15 ENCOUNTER — Ambulatory Visit (INDEPENDENT_AMBULATORY_CARE_PROVIDER_SITE_OTHER): Payer: Medicare HMO | Admitting: *Deleted

## 2023-06-15 DIAGNOSIS — J4550 Severe persistent asthma, uncomplicated: Secondary | ICD-10-CM

## 2023-06-16 ENCOUNTER — Other Ambulatory Visit: Payer: Self-pay | Admitting: General Practice

## 2023-06-23 ENCOUNTER — Telehealth: Payer: Self-pay | Admitting: Physical Medicine and Rehabilitation

## 2023-06-23 NOTE — Telephone Encounter (Signed)
 Patient came by. Says the injection did not help. She is still in pain. Her # [phone]

## 2023-06-25 ENCOUNTER — Ambulatory Visit: Payer: Medicare HMO | Admitting: Orthopaedic Surgery

## 2023-06-28 ENCOUNTER — Ambulatory Visit: Payer: Medicare HMO | Admitting: Orthopaedic Surgery

## 2023-06-29 ENCOUNTER — Ambulatory Visit (INDEPENDENT_AMBULATORY_CARE_PROVIDER_SITE_OTHER): Payer: Medicare HMO | Admitting: Physical Medicine and Rehabilitation

## 2023-06-29 ENCOUNTER — Encounter: Payer: Self-pay | Admitting: Physical Medicine and Rehabilitation

## 2023-06-29 DIAGNOSIS — M48062 Spinal stenosis, lumbar region with neurogenic claudication: Secondary | ICD-10-CM

## 2023-06-29 DIAGNOSIS — M47816 Spondylosis without myelopathy or radiculopathy, lumbar region: Secondary | ICD-10-CM

## 2023-06-29 DIAGNOSIS — M545 Low back pain, unspecified: Secondary | ICD-10-CM | POA: Diagnosis not present

## 2023-06-29 DIAGNOSIS — G8929 Other chronic pain: Secondary | ICD-10-CM | POA: Diagnosis not present

## 2023-06-29 NOTE — Progress Notes (Signed)
 [name] - 68 y.o. female MRN [id number]  Date of birth: July 01, 1955  Office Visit Note: Visit Date: 06/29/2023 PCP: Gretta Lacinda Browning, MD Referred by: Gretta Lacinda Browning,*  Subjective: Chief Complaint  Patient presents with   Lower Back - Follow-up   HPI: Patty Howard is a 68 y.o. female who comes in today for evaluation of chronic, worsening and severe right sided lower back pain. She is here today in follow up from recent right L4-L5 interlaminar epidural steroid injection on 06/07/2023. She reports complete resolution of pain to right buttock, hip and leg. Also reports increased functional ability post injection. Right lower back pain remains, worsens with standing and household activities. She describes her pain as sore and aching sensation, currently rates as 8 out of 10. Some relief of pain with home exercise regimen, rest and use medications. She is currently undergoing chronic pain management with Dr. Debby Beard with [facility], she is prescribed 90 tablets of 5 mg Oxycodone  IR monthly. Some relief of pain with Lidocaine  patches. History of formal physical therapy while living in [place]  several years ago, no relief of pain with treatments. She also reports history of spinal cord stimulator placement, was removed after 1 year due to minimal relief of pain. Patients lumbar MRI imaging from 2022 exhibits marked facet arthropathy and spinal canal stenosis at the level of L4-L5, there is also moderate spinal canal stenosis at L3-L4. She is not currently using cane today. Patient denies focal weakness, numbness and tingling. No recent trauma or falls.      Review of Systems  Musculoskeletal:  Positive for back pain.  Neurological:  Negative for tingling, sensory change, focal weakness and weakness.  All other systems reviewed and are negative.  Otherwise per HPI.  Assessment & Plan: Visit Diagnoses:    ICD-10-CM   1. Chronic right-sided low back  pain without sciatica  M54.50 Ambulatory referral to Physical Medicine Rehab   G89.29     2. Facet arthropathy, lumbar  M47.816 Ambulatory referral to Physical Medicine Rehab    3. Spinal stenosis of lumbar region with neurogenic claudication  M48.062 Ambulatory referral to Physical Medicine Rehab       Plan: Findings:  Chronic, worsening and severe right sided lower back pain. Complete resolution of right sided radicular symptoms with recent lumbar epidural steroid injection. Her right sided lower back pain continues despite good conservative therapies such as home exercise regiment, rest and use of medications. Patients clinical presentation and exam are consistent with facet mediated pain. She does have severe facet arthropathy at the level of L4-L5. She has pain with lumbar extension on exam today. Next step is to perform diagnostic and hopefully therapeutic right L4-L5 facet joint injection under fluoroscopic guidance. Should her pain present as more radicular in nature would consider repeating lumbar epidural steroid injection. If her pain persists we did discuss possibility of consult with our spine surgeon Dr. Ozell Ada to discuss options. Patient has no questions at this time. No red flag symptoms noted upon exam today.     Meds & Orders: No orders of the defined types were placed in this encounter.   Orders Placed This Encounter  Procedures   Ambulatory referral to Physical Medicine Rehab    Follow-up: Return for Right L4-L5 facet joint injection.   Procedures: No procedures performed      Clinical History: EXAM: MRI LUMBAR SPINE WITHOUT AND WITH CONTRAST   TECHNIQUE: Multiplanar and multiecho pulse sequences of the lumbar spine were  obtained without and with intravenous contrast.   CONTRAST:  20mL MULTIHANCE  GADOBENATE DIMEGLUMINE  529 MG/ML IV SOLN   COMPARISON:  None.   FINDINGS: Segmentation:  Standard.   Alignment:  Trace anterolisthesis at L4-L5 and L5-S1.    Vertebrae: Vertebral body heights are maintained. There is no substantial marrow edema. No suspicious osseous lesion.   Conus medullaris and cauda equina: Conus extends to the T12-L1 level. Conus and cauda equina appear normal.   Paraspinal and other soft tissues: Unremarkable.   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   L1-L2: Prominent epidural fat mildly flattening the thecal sac. No foraminal stenosis.   L2-L3: Small left foraminal protrusion. Mild facet arthropathy. Prominent dorsal epidural fat mildly flattening the thecal sac. No right foraminal stenosis. Minor left foraminal stenosis.   L3-L4: Disc bulge. Marked right and mild left facet arthropathy with ligamentum flavum infolding. Moderate canal stenosis. Narrowing of the subarticular recesses. Mild foraminal stenosis.   L4-L5: Disc bulge. Marked facet arthropathy with prominent hypertrophic changes and ligamentum flavum infolding. Marked canal stenosis. Narrowing of the subarticular recesses. Mild right foraminal stenosis. No left foraminal stenosis.   L5-S1: Disc bulge. Marked facet arthropathy with prominent hypertrophic changes and ligamentum flavum infolding. Minor canal stenosis. Mild foraminal stenosis.   IMPRESSION: Multilevel degenerative changes as detailed above. Canal stenosis is greatest L4-L5. There is marked lower lumbar facet arthropathy.     Electronically Signed   By: Santina Blanch M.D.   On: 10/14/2020 10:29   She reports that she quit smoking about 25 years ago. Her smoking use included cigarettes. She started smoking about 35 years ago. She has a 5 pack-year smoking history. She has been exposed to tobacco smoke. She has never used smokeless tobacco. No results for input(s): HGBA1C, LABURIC in the last 8760 hours.  Objective:  VS:  HT:    WT:   BMI:     BP:   HR: bpm  TEMP: ( )  RESP:  Physical Exam Vitals and nursing note reviewed.  HENT:     Head: Normocephalic  and atraumatic.     Right Ear: External ear normal.     Left Ear: External ear normal.     Nose: Nose normal.     Mouth/Throat:     Mouth: Mucous membranes are moist.  Eyes:     Extraocular Movements: Extraocular movements intact.  Cardiovascular:     Rate and Rhythm: Normal rate.     Pulses: Normal pulses.  Pulmonary:     Effort: Pulmonary effort is normal.  Abdominal:     General: Abdomen is flat. There is no distension.  Musculoskeletal:        General: Tenderness present.     Cervical back: Normal range of motion.     Comments: Patient rises from seated position to standing without difficulty. Pain noted with facet loading and lumbar extension. 5/5 strength noted with bilateral hip flexion, knee flexion/extension, ankle dorsiflexion/plantarflexion and EHL. No clonus noted bilaterally. No pain upon palpation of greater trochanters. No pain with internal/external rotation of bilateral hips. Sensation intact bilaterally. Negative slump test bilaterally. Ambulates without aid, gait steady.     Skin:    General: Skin is warm and dry.     Capillary Refill: Capillary refill takes less than 2 seconds.  Neurological:     General: No focal deficit present.     Mental Status: She is alert and oriented to person, place, and time.  Psychiatric:  Mood and Affect: Mood normal.        Behavior: Behavior normal.     Ortho Exam  Imaging: No results found.  Past Medical/Family/Surgical/Social History: Medications & Allergies reviewed per EMR, new medications updated. 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) 10/08/2022    Perennial allergic rhinitis 10/08/2022   COPD exacerbation (HCC) 04/18/2022   Right hip pain 02/17/2022   Cough 12/05/2021   Type 2 diabetes mellitus without complication, without long-term current use of insulin  (HCC) 11/05/2021   Trigger finger, left middle finger 10/02/2021   COPD with acute exacerbation (HCC) 09/28/2021   Acute exacerbation of COPD with asthma (HCC) 09/27/2021   Other intervertebral disc degeneration, lumbar region 07/15/2021   History of total hip arthroplasty, left 03/07/2021   Trochanteric bursitis, left hip 12/23/2020   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   Diastolic dysfunction 11/25/2020   Asthma exacerbation 11/13/2020   Menopause 11/08/2020   Medication management 04/30/2020   Gastroesophageal reflux disease 03/14/2020   OSA (obstructive sleep apnea) 02/15/2020   Abnormal findings on diagnostic imaging of lung 01/08/2020   Educated about COVID-19 virus infection 11/30/2019   Aortic atherosclerosis (HCC) 11/30/2019   Chest pain 11/26/2019   Acute asthma exacerbation 05/22/2019   Left radial head fracture 02/03/2019   Closed fracture of radial head 01/29/2019   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) (HCC) 10/19/2018   Insomnia 10/19/2018   Elbow pain, chronic, left 10/19/2018   Dyspnea 10/12/2017   Pancolitis (HCC) 05/20/2017   H/O Spinal surgery 01/18/2015   Essential hypertension 01/18/2015   Obesity 01/18/2015   Past Medical History:  Diagnosis Date   Anxiety    Arthritis    Asthma    COPD (chronic obstructive pulmonary disease) (HCC)    Fracture    closed displaced left radial head   GERD (gastroesophageal reflux disease)    Hypertension    Sleep apnea    does not wear CPAP   Family History  Problem Relation Age of Onset   Hypertension Mother    Stroke Mother    Lung cancer Father    Colon cancer Neg Hx    Stomach cancer Neg Hx    Pancreatic cancer Neg Hx    Past Surgical History:  Procedure Laterality Date   BACK  SURGERY     spinal stimulator   BIOPSY  11/10/2022   Procedure: BIOPSY;  Surgeon: Shila Gustav GAILS, MD;  Location: [facility];  Service: Gastroenterology;;   BREAST BIOPSY Right 2017   benign   COLONOSCOPY W/ BIOPSIES AND POLYPECTOMY     COLONOSCOPY WITH PROPOFOL  N/A 11/10/2022   Procedure: COLONOSCOPY WITH PROPOFOL ;  Surgeon: Shila Gustav GAILS, MD;  Location: [facility];  Service: Gastroenterology;  Laterality: N/A;   DILATION AND CURETTAGE OF UTERUS     ESOPHAGOGASTRODUODENOSCOPY (EGD) WITH PROPOFOL  N/A 11/10/2022   Procedure: ESOPHAGOGASTRODUODENOSCOPY (EGD) WITH PROPOFOL ;  Surgeon: Shila Gustav GAILS, MD;  Location: [facility];  Service: Gastroenterology;  Laterality: N/A;   LAPAROSCOPIC ROUX-EN-Y GASTRIC BYPASS WITH UPPER ENDOSCOPY AND REMOVAL OF LAP BAND     POLYPECTOMY  11/10/2022   Procedure: POLYPECTOMY;  Surgeon: Shila Gustav GAILS, MD;  Location: [facility];  Service: Gastroenterology;;   RADIAL HEAD ARTHROPLASTY Left 02/03/2019   Procedure: LEFT RADIAL HEAD ARTHROPLASTY;  Surgeon: Barbarann Oneil BROCKS, MD;  Location: [facility] OR;  Service: Orthopedics;  Laterality: Left;  AXILLARY BLOCK VS BIER BLOCK   TOTAL HIP ARTHROPLASTY Left 01/22/2021   Procedure: LEFT TOTAL HIP ARTHROPLASTY ANTERIOR APPROACH;  Surgeon: Barbarann Oneil BROCKS, MD;  Location: [facility] OR;  Service: Orthopedics;  Laterality: Left;   TUBAL LIGATION     Social History   Occupational History   Not on file  Tobacco Use   Smoking status: Former    Current packs/day: 0.00    Average packs/day: 0.5 packs/day for 10.0 years (5.0 ttl pk-yrs)    Types: Cigarettes    Start date: 06/15/1988    Quit date: 06/15/1998    Years since quitting: 25.0    Passive exposure: Past   Smokeless tobacco: Never  Vaping Use   Vaping status: Never Used  Substance and Sexual Activity   Alcohol use: Never   Drug use: Never   Sexual activity: Yes

## 2023-06-29 NOTE — Progress Notes (Signed)
R > L  She said the injection did help, but she still is having some pain. It isn't as bad as It was before.

## 2023-07-04 ENCOUNTER — Other Ambulatory Visit: Payer: Self-pay

## 2023-07-04 ENCOUNTER — Emergency Department (HOSPITAL_BASED_OUTPATIENT_CLINIC_OR_DEPARTMENT_OTHER)
Admission: EM | Admit: 2023-07-04 | Discharge: 2023-07-04 | Disposition: A | Discharge status: Home / Self Care | Payer: Medicare HMO | Attending: Emergency Medicine | Admitting: Emergency Medicine

## 2023-07-04 ENCOUNTER — Encounter (HOSPITAL_BASED_OUTPATIENT_CLINIC_OR_DEPARTMENT_OTHER): Payer: Self-pay | Admitting: Emergency Medicine

## 2023-07-04 DIAGNOSIS — G8929 Other chronic pain: Secondary | ICD-10-CM | POA: Diagnosis not present

## 2023-07-04 DIAGNOSIS — Z7951 Long term (current) use of inhaled steroids: Secondary | ICD-10-CM | POA: Insufficient documentation

## 2023-07-04 DIAGNOSIS — J45909 Unspecified asthma, uncomplicated: Secondary | ICD-10-CM | POA: Diagnosis not present

## 2023-07-04 DIAGNOSIS — J449 Chronic obstructive pulmonary disease, unspecified: Secondary | ICD-10-CM | POA: Insufficient documentation

## 2023-07-04 DIAGNOSIS — I10 Essential (primary) hypertension: Secondary | ICD-10-CM | POA: Diagnosis not present

## 2023-07-04 DIAGNOSIS — Z79899 Other long term (current) drug therapy: Secondary | ICD-10-CM | POA: Insufficient documentation

## 2023-07-04 DIAGNOSIS — M545 Low back pain, unspecified: Secondary | ICD-10-CM | POA: Insufficient documentation

## 2023-07-04 MED ORDER — LIDOCAINE 5 % EX PTCH: 1.0000 | MEDICATED_PATCH | CUTANEOUS | 0 refills | Status: AC

## 2023-07-04 MED ORDER — KETOROLAC TROMETHAMINE 15 MG/ML IJ SOLN
15.0000 mg | Freq: Once | INTRAMUSCULAR | Status: AC
  Administered 2023-07-04: 15 mg via INTRAMUSCULAR
  Filled 2023-07-04: qty 1

## 2023-07-04 MED ORDER — LIDOCAINE 5 % EX PTCH
1.0000 | MEDICATED_PATCH | CUTANEOUS | Status: DC
  Administered 2023-07-04: 1 via TRANSDERMAL
  Filled 2023-07-04: qty 1

## 2023-07-04 MED ORDER — PREDNISONE 50 MG PO TABS
60.0000 mg | ORAL_TABLET | Freq: Once | ORAL | Status: AC
  Administered 2023-07-04: 60 mg via ORAL
  Filled 2023-07-04: qty 1

## 2023-07-04 MED ORDER — KETOROLAC TROMETHAMINE 10 MG PO TABS: 10.0000 mg | ORAL_TABLET | Freq: Four times a day (QID) | ORAL | 0 refills | Status: AC | PRN

## 2023-07-04 MED ORDER — KETOROLAC TROMETHAMINE 15 MG/ML IJ SOLN: 15.0000 mg | Freq: Once | INTRAMUSCULAR | Status: DC

## 2023-07-04 MED ORDER — PREDNISONE 10 MG (21) PO TBPK: ORAL_TABLET | Freq: Every day | ORAL | 0 refills | Status: DC

## 2023-07-04 NOTE — ED Notes (Signed)
Discharge paperwork given and verbally understood. 

## 2023-07-04 NOTE — Discharge Instructions (Addendum)
You have been seen today for your complaint of back pain. Your discharge medications include prednisone.  This is a steroid.  Take it as prescribed and for the entire duration of the prescription. Toradol.  This is a pain medicine.  Take this as needed in addition to your other pain medicines.  Do not take this while you are using ibuprofen or Aleve. Follow up with: Your orthopedic specialist Please seek immediate medical care if you develop any of the following symptoms: You are not able to control when you pee or poop. You have bad back pain and: You feel like you may vomit (nauseous). You vomit. You have pain in your chest or your belly (abdomen). You have shortness of breath. You faint. At this time there does not appear to be the presence of an emergent medical condition, however there is always the potential for conditions to change. Please read and follow the below instructions.  Do not take your medicine if  develop an itchy rash, swelling in your mouth or lips, or difficulty breathing; call 911 and seek immediate emergency medical attention if this occurs.  You may review your lab tests and imaging results in their entirety on your MyChart account.  Please discuss all results of fully with your primary care provider and other specialist at your follow-up visit.  Note: Portions of this text may have been transcribed using voice recognition software. Every effort was made to ensure accuracy; however, inadvertent computerized transcription errors may still be present.

## 2023-07-04 NOTE — ED Triage Notes (Signed)
Pt was here 2 months ago for back pain (right middle back). Pt had an injection around christmas. Pain was better for a short time. She is scheduled for a second injection,but can't stand pain.

## 2023-07-04 NOTE — ED Provider Notes (Signed)
[place] EMERGENCY DEPARTMENT AT [facility] Provider Note   CSN: [id number] Arrival date & time: 07/04/23  0844     History  No chief complaint on file.    Sumiyah Pigg is a 68 y.o. female.  With a history of hypertension, anxiety, asthma, COPD, arthritis, GERD, chronic back pain presenting to the ED for evaluation of back pain.  She is followed by: Orthopedics and was seen 5 days prior to arrival.  She had L4-L5 interlaminar epidural steroid injection on 06/07/2023.  She is on chronic pain management contract with Dr. Manon Hilding with [facility] and takes 5 mg oxycodone IR.  She reports her radicular symptoms went away after her injection, however the chronic right mid to low back pain has worsened.  She denies any dysuria, frequency, urgency, hematuria.  No fevers or chills, nausea, vomiting, abdominal pain.  No saddle anesthesia, lower extremity numbness, weakness or tingling.  No urinary or fecal incontinence.  She has not been taking her oxycodone at home because she states it does not work.  She is requesting Toradol and oral steroids as this has worked for her in the past.  No history of diabetes or gastric ulcers.  No recent falls or history of trauma.  HPI     Home Medications Prior to Admission medications   Medication Sig Start Date End Date Taking? Authorizing Provider  ketorolac (TORADOL) 10 MG tablet Take 1 tablet (10 mg total) by mouth every 6 (six) hours as needed for up to 5 days. 07/04/23 07/09/23 Yes Kimberley Dastrup, Edsel Petrin, PA-C  lidocaine (LIDODERM) 5 % Place 1 patch onto the skin daily. Remove & Discard patch within 12 hours or as directed by MD 07/04/23  Yes Bina Veenstra, Edsel Petrin, PA-C  predniSONE (STERAPRED UNI-PAK 21 TAB) 10 MG (21) TBPK tablet Take by mouth daily. Take 6 tabs by mouth daily  for 2 days, then 5 tabs for 2 days, then 4 tabs for 2 days, then 3 tabs for 2 days, 2 tabs for 2 days, then 1 tab by mouth daily for 2 days 07/04/23  Yes Maleik Vanderzee, Edsel Petrin, PA-C  albuterol (PROVENTIL) (2.5 MG/3ML) 0.083% nebulizer solution Take 3 mLs (2.5 mg total) by nebulization every 6 (six) hours as needed for wheezing or shortness of breath. 08/03/22   Alfonse Spruce, MD  albuterol (VENTOLIN HFA) 108 (90 Base) MCG/ACT inhaler Inhale 2 puffs into the lungs every 6 (six) hours as needed for wheezing or shortness of breath. 05/18/23   Alfonse Spruce, MD  amLODipine (NORVASC) 5 MG tablet Take 1 tablet (5 mg total) by mouth daily. 06/18/22   Ronney Asters, NP  ascorbic acid (VITAMIN C) 500 MG tablet Take 500 mg by mouth daily.    [provider]  atorvastatin (LIPITOR) 20 MG tablet Take 1 tablet (20 mg total) by mouth daily. 06/18/22   Ronney Asters, NP  azithromycin (ZITHROMAX) 500 MG tablet Take 1 tablet (500 mg total) by mouth every Monday, Wednesday, and Friday. 05/19/23   Alfonse Spruce, MD  baclofen (LIORESAL) 10 MG tablet Take 10 mg by mouth 2 (two) times daily as needed. 11/25/22   [provider]  diazepam (VALIUM) 5 MG tablet Take one tablet by mouth with food one hour prior to procedure. May repeat 30 minutes prior if needed. 05/19/23   Juanda Chance, NP  fluticasone (FLONASE) 50 MCG/ACT nasal spray Place 1 spray into both nostrils daily. 03/22/23   [provider]  furosemide (  LASIX) 40 MG tablet TAKE 1 TABLET BY MOUTH EVERY DAY 06/17/23   Ronney Asters, NP  ibuprofen (ADVIL) 800 MG tablet Take 1 tablet (800 mg total) by mouth every 8 (eight) hours as needed for moderate pain. 02/17/22   de [place], Buren Kos, MD  ipratropium-albuterol (DUONEB) 0.5-2.5 (3) MG/3ML SOLN Take 3 mLs by nebulization every 6 (six) hours as needed. Patient taking differently: Take 3 mLs by nebulization every 6 (six) hours as needed (Wheezing, shortness of breath). 04/23/21   Verlee Monte, MD  levothyroxine (SYNTHROID) 100 MCG tablet Take 100 mcg by mouth daily before breakfast.    [provider]  LINZESS 290 MCG CAPS capsule  Take 290 mcg by mouth daily. 03/17/22   [provider]  losartan-hydrochlorothiazide (HYZAAR) 100-25 MG tablet Take 1 tablet by mouth daily. 10/21/21   Duke, Roe Rutherford, PA  meloxicam (MOBIC) 15 MG tablet Take 15 mg by mouth as needed for pain. 02/22/23   [provider]  Multiple Vitamin (MULTIVITAMIN WITH MINERALS) TABS tablet Take 1 tablet by mouth daily. Centrum Multivitamin    [provider]  omeprazole (PRILOSEC) 40 MG capsule Take 1 capsule (40 mg total) by mouth daily. STOP PANTOPRAZOLE 02/03/23 03/17/23  Napoleon Form, MD  oxyCODONE (OXY IR/ROXICODONE) 5 MG immediate release tablet Take 5 mg by mouth 3 (three) times daily as needed. 05/04/23   [provider]  potassium chloride SA (KLOR-CON M) 20 MEQ tablet Take 1 tablet (20 mEq total) by mouth daily. 09/30/21 11/10/22  Dorcas Carrow, MD  Semaglutide, 1 MG/DOSE, 4 MG/3ML SOPN Inject 1 mg as directed once a week. Patient taking differently: Inject 1 mg into the skin once a week. 02/17/22   de [place], Raymond J, MD  Vitamin D, Ergocalciferol, (DRISDOL) 1.25 MG (50000 UNIT) CAPS capsule Take 50,000 Units by mouth every Monday.  10/27/19   [provider]  zafirlukast (ACCOLATE) 20 MG tablet TAKE 1 TABLET (20 MG TOTAL) BY MOUTH 2 (TWO) TIMES DAILY BEFORE A MEAL. 12/28/22 06/26/23  Hetty Blend, FNP  zolpidem (AMBIEN CR) 6.25 MG CR tablet Take 1 tablet (6.25 mg total) by mouth at bedtime as needed for sleep. 04/29/22   Tomma Lightning, MD      Allergies    Patient has no active allergies.    Review of Systems   Review of Systems  Musculoskeletal:  Positive for back pain.  All other systems reviewed and are negative.   Physical Exam Updated Vital Signs BP (!) 140/86 (BP Location: Right Arm)   Pulse 74   Temp 99.4 F (37.4 C) (Oral)   Resp 18   Ht 5\' 9"  (1.753 m)   Wt 111.6 kg   SpO2 96%   BMI 36.33 kg/m  Physical Exam Vitals and nursing note reviewed.  Constitutional:      General:  She is not in acute distress.    Appearance: Normal appearance. She is normal weight. She is not ill-appearing.  HENT:     Head: Normocephalic and atraumatic.  Pulmonary:     Effort: Pulmonary effort is normal. No respiratory distress.  Abdominal:     General: Abdomen is flat.  Musculoskeletal:        General: Normal range of motion.     Cervical back: Neck supple.     Comments: TTP to right mid and low back paraspinal muscles.  No specific midline tenderness.  No rashes.  No step-offs, deformities, masses or crepitus.  Skin:  General: Skin is warm and dry.  Neurological:     Mental Status: She is alert and oriented to person, place, and time.  Psychiatric:        Mood and Affect: Mood normal.        Behavior: Behavior normal.     ED Results / Procedures / Treatments   Labs (all labs ordered are listed, but only abnormal results are displayed) Labs Reviewed - No data to display  EKG None  Radiology No results found.  Procedures Procedures    Medications Ordered in ED Medications  lidocaine (LIDODERM) 5 % 1 patch (1 patch Transdermal Patch Applied 07/04/23 0936)  predniSONE (DELTASONE) tablet 60 mg (60 mg Oral Given 07/04/23 0942)  ketorolac (TORADOL) 15 MG/ML injection 15 mg (15 mg Intramuscular Given 07/04/23 2440)    ED Course/ Medical Decision Making/ A&P                                 Medical Decision Making This patient presents to the ED for concern of back pain, this involves an extensive number of treatment options, and is a complaint that carries with it a high risk of complications and morbidity.  The emergent differential diagnosis for back pain includes but is not limited to fracture, muscle strain, cauda equina, spinal stenosis. DDD, ankylosing spondylitis, acute ligamentous injury, disk herniation, spondylolisthesis, Epidural compression syndrome, metastatic cancer, transverse myelitis, vertebral osteomyelitis, diskitis, kidney stone, pyelonephritis,  AAA, Perforated ulcer, Retrocecal appendicitis, pancreatitis, bowel obstruction, retroperitoneal hemorrhage or mass, meningitis.   My initial workup includes symptom control  Additional history obtained from: Nursing notes from this visit. Numerous office visit notes with orthopedic physiatry, most recently on 06/29/2023  Afebrile, hypertensive but otherwise hemodynamically stable.  68 year old female presenting to the ED for evaluation of chronic right-sided back pain.  States that has progressively worsened over the past 2 weeks.  She has not been using her oxycodone because she states it does not work.  She follows closely with orthopedics and physiatry.  She is seen by pain specialist.  She specifically states that oral steroids and Toradol has worked well for her in the past.  She is in discussions with her orthopedic team regarding potential back surgery for chronic pain.  No red flag signs or symptoms to suggest cord compression syndrome.  No signs or symptoms of infection.  She was treated in the emergency department with improvement in her symptoms.  She was encouraged to follow-up with her orthopedic team regarding continued management. she was given return precautions.  Stable at discharge.  At this time there does not appear to be any evidence of an acute emergency medical condition and the patient appears stable for discharge with appropriate outpatient follow up. Diagnosis was discussed with patient who verbalizes understanding of care plan and is agreeable to discharge. I have discussed return precautions with patient who verbalizes understanding. Patient encouraged to follow-up with their PCP within 1 week. All questions answered.  Note: Portions of this report may have been transcribed using voice recognition software. Every effort was made to ensure accuracy; however, inadvertent computerized transcription errors may still be present.         Final Clinical Impression(s) / ED  Diagnoses Final diagnoses:  Chronic right-sided low back pain without sciatica    Rx / DC Orders ED Discharge Orders          Ordered    predniSONE (STERAPRED  UNI-PAK 21 TAB) 10 MG (21) TBPK tablet  Daily        07/04/23 0957    lidocaine (LIDODERM) 5 %  Every 24 hours        07/04/23 0957    ketorolac (TORADOL) 10 MG tablet  Every 6 hours PRN        07/04/23 0957              Michelle Piper, PA-C 07/04/23 0957    Anders Simmonds T, DO 07/07/23 [id number]

## 2023-07-12 ENCOUNTER — Other Ambulatory Visit: Payer: Self-pay | Admitting: *Deleted

## 2023-07-12 MED ORDER — TEZSPIRE 210 MG/1.91ML ~~LOC~~ SOSY: 210.0000 mg | PREFILLED_SYRINGE | SUBCUTANEOUS | 11 refills | Status: DC

## 2023-07-13 ENCOUNTER — Telehealth: Payer: Self-pay

## 2023-07-13 ENCOUNTER — Ambulatory Visit: Payer: Medicare HMO

## 2023-07-13 ENCOUNTER — Other Ambulatory Visit: Payer: Self-pay | Admitting: Physical Medicine and Rehabilitation

## 2023-07-13 MED ORDER — DIAZEPAM 5 MG PO TABS: ORAL_TABLET | ORAL | 0 refills | Status: DC

## 2023-07-13 NOTE — Telephone Encounter (Signed)
Please send pre-procedure valium in to [facility] on [address]. Her procedure is tomorrow.THX

## 2023-07-14 ENCOUNTER — Other Ambulatory Visit: Payer: Self-pay

## 2023-07-14 ENCOUNTER — Ambulatory Visit: Payer: Medicare HMO

## 2023-07-14 ENCOUNTER — Ambulatory Visit (INDEPENDENT_AMBULATORY_CARE_PROVIDER_SITE_OTHER): Payer: Medicare HMO | Admitting: Pulmonary Disease

## 2023-07-14 ENCOUNTER — Encounter: Payer: Self-pay | Admitting: Pulmonary Disease

## 2023-07-14 ENCOUNTER — Ambulatory Visit (INDEPENDENT_AMBULATORY_CARE_PROVIDER_SITE_OTHER): Payer: Medicare HMO | Admitting: Physical Medicine and Rehabilitation

## 2023-07-14 VITALS — BP 120/76 | HR 81 | Temp 98.5°F | Ht 69.0 in | Wt 268.4 lb

## 2023-07-14 DIAGNOSIS — G4733 Obstructive sleep apnea (adult) (pediatric): Secondary | ICD-10-CM

## 2023-07-14 DIAGNOSIS — Z8709 Personal history of other diseases of the respiratory system: Secondary | ICD-10-CM | POA: Diagnosis not present

## 2023-07-14 DIAGNOSIS — M545 Low back pain, unspecified: Secondary | ICD-10-CM | POA: Diagnosis not present

## 2023-07-14 DIAGNOSIS — M25551 Pain in right hip: Secondary | ICD-10-CM

## 2023-07-14 DIAGNOSIS — J4550 Severe persistent asthma, uncomplicated: Secondary | ICD-10-CM

## 2023-07-14 DIAGNOSIS — M47816 Spondylosis without myelopathy or radiculopathy, lumbar region: Secondary | ICD-10-CM

## 2023-07-14 DIAGNOSIS — G894 Chronic pain syndrome: Secondary | ICD-10-CM | POA: Diagnosis not present

## 2023-07-14 MED ORDER — ALBUTEROL SULFATE HFA 108 (90 BASE) MCG/ACT IN AERS: 2.0000 | INHALATION_SPRAY | Freq: Four times a day (QID) | RESPIRATORY_TRACT | 2 refills | Status: DC | PRN

## 2023-07-14 MED ORDER — METHYLPREDNISOLONE ACETATE 40 MG/ML IJ SUSP
40.0000 mg | Freq: Once | INTRAMUSCULAR | Status: AC
  Administered 2023-07-14: 40 mg

## 2023-07-14 NOTE — Patient Instructions (Signed)

## 2023-07-14 NOTE — Progress Notes (Signed)
 Subjective:    Patient ID: Patty Howard, female    DOB: 21-Apr-1956, 68 y.o.   MRN: [id number]  She is in for follow-up today  Has been following up with Dr. Dellis Anes, allergy and asthma  CPAP machine quit working Will is benefiting from CPAP use  She is on [place], rescue inhaler use as needed She usually will use nebulizer in the morning, does have some cough and secretion clearance in the morning  Denies any significant exacerbation at present  She is yet to have home sleep study performed -She did pick up the machine today and will be having the study done tonight   No fevers, no chills, she does use cough medicine to help clear secretions  She does have a background history of asthma/COPD, history of hypertension     Past Medical History:  Diagnosis Date  . Anxiety   . Arthritis   . Asthma   . COPD (chronic obstructive pulmonary disease) (HCC)   . Fracture    closed displaced left radial head  . GERD (gastroesophageal reflux disease)   . Hypertension   . Sleep apnea    does not wear CPAP   Social History   Socioeconomic History  . Marital status: Widowed    Spouse name: Not on file  . Number of children: Not on file  . Years of education: Not on file  . Highest education level: Not on file  Occupational History  . Not on file  Tobacco Use  . Smoking status: Former    Current packs/day: 0.00    Average packs/day: 0.5 packs/day for 10.0 years (5.0 ttl pk-yrs)    Types: Cigarettes    Start date: 06/15/1988    Quit date: 06/15/1998    Years since quitting: 25.0    Passive exposure: Past  . Smokeless tobacco: Never  Vaping Use  . Vaping status: Never Used  Substance and Sexual Activity  . Alcohol use: Never  . Drug use: Never  . Sexual activity: Yes  Other Topics Concern  . Not on file  Social History Narrative   Three children.  Emergency planning/management officer in [place].     Social Drivers of Corporate investment banker Strain: Not on file  Food Insecurity:  Not on file  Transportation Needs: Not on file  Physical Activity: Not on file  Stress: Not on file  Social Connections: Not on file  Intimate Partner Violence: Not on file (09/26/2022)   Family History  Problem Relation Age of Onset  . Hypertension Mother   . Stroke Mother   . Lung cancer Father   . Colon cancer Neg Hx   . Stomach cancer Neg Hx   . Pancreatic cancer Neg Hx     Review of Systems  Constitutional:  Negative for fever and unexpected weight change.  HENT:  Negative for congestion, dental problem, ear pain, nosebleeds, postnasal drip, rhinorrhea, sinus pressure, sneezing, sore throat and trouble swallowing.   Eyes:  Negative for redness and itching.  Respiratory:  Negative for cough, chest tightness, shortness of breath and wheezing.   Cardiovascular:  Negative for palpitations and leg swelling.  Gastrointestinal:  Negative for nausea and vomiting.  Genitourinary:  Negative for dysuria.  Musculoskeletal:  Negative for joint swelling.  Skin:  Negative for rash.  Allergic/Immunologic: Positive for environmental allergies and food allergies. Negative for immunocompromised state.  Neurological:  Negative for headaches.  Hematological:  Does not bruise/bleed easily.  Psychiatric/Behavioral:  Negative for dysphoric mood. The patient is  not nervous/anxious.        Objective:   Physical Exam Constitutional:      Appearance: She is obese.  HENT:     Head: Normocephalic and atraumatic.     Mouth/Throat:     Mouth: Mucous membranes are moist.  Eyes:     Pupils: Pupils are equal, round, and reactive to light.  Cardiovascular:     Rate and Rhythm: Normal rate.     Pulses: Normal pulses.     Heart sounds: Normal heart sounds. No murmur heard.    No friction rub.  Pulmonary:     Effort: Pulmonary effort is normal. No respiratory distress.     Breath sounds: No stridor. No wheezing or rhonchi.  Musculoskeletal:     Cervical back: No rigidity or tenderness.   Neurological:     General: No focal deficit present.     Mental Status: She is alert.  Psychiatric:        Mood and Affect: Mood normal.   Vitals:   07/14/23 1027  BP: 120/76  Pulse: 81  Temp: 98.5 F (36.9 C)  SpO2: 96%      04/29/2022    8:00 AM 04/05/2019    2:00 PM  Results of the Epworth flowsheet  Sitting and reading 3 3  Watching TV 2 3  Sitting, inactive in a public place (e.g. a theatre or a meeting) 2 3  As a passenger in a car for an hour without a break 2 3  Lying down to rest in the afternoon when circumstances permit 2 3  Sitting and talking to someone 0 3  Sitting quietly after a lunch without alcohol 0 3  In a car, while stopped for a few minutes in traffic 0 3  Total score 11 24   CT scan of the chest was reviewed with the patient -CT scan was performed 04/21/2022 This was compared with the previous CT 09/27/2021 and also CT 11/26/2019 -CAT scans do not reveal any evidence of interstitial lung disease, there is some mild bronchiectasis, small lung nodule  Last PFT on record is 06/30/2021 showing moderate obstructive disease with no significant bronchodilator response  She does have a history of obstructive sleep apnea for which she was using CPAP, was benefiting from CPAP use  Assessment & Plan:   History of bronchitis -Stable symptoms at present  History of obstructive sleep apnea that was severe -She will be having a home sleep study done today  Obstructive lung disease on PFT -Continue bronchodilator treatments  History of asthma -On test prior, on Singulair  History of tracheobronchomalacia  Plan Complete home sleep study  Continue asthma management  Ambien for insomnia  Encouraged to continue to focus on weight loss efforts  She should give [place] a call with any significant concerns  Tentative follow-up in about 3 months  Encouraged to call with significant concerns

## 2023-07-14 NOTE — Progress Notes (Unsigned)
Functional Pain Scale - descriptive words and definitions  Uncomfortable (3)  Pain is present but can complete all ADL's/sleep is slightly affected and passive distraction only gives marginal relief. Mild range order  Average Pain 3   +Driver, -BT, -Dye Allergies.

## 2023-07-14 NOTE — Patient Instructions (Signed)
Once you complete the sleep study  We will give you a call to let you know what it shows  Will contact your medical supply company to set you up with a CPAP  Tentative follow-up in about 3 months  Call us with significant concerns  Your albuterol and will are renewed

## 2023-07-15 ENCOUNTER — Encounter: Payer: Self-pay | Admitting: Physical Medicine and Rehabilitation

## 2023-07-15 ENCOUNTER — Other Ambulatory Visit: Payer: Self-pay | Admitting: General Practice

## 2023-07-15 NOTE — Procedures (Signed)
Lumbar Facet Joint Intra-Articular Injection(s) with Fluoroscopic Guidance  Patient: Patty Howard      Date of Birth: 11-15-1955 MRN: [id number] PCP: Melvenia Beam, MD      Visit Date: 07/14/2023   Universal Protocol:    Date/Time: 07/14/2023  Consent Given By: the patient  Position: PRONE   Additional Comments: Vital signs were monitored before and after the procedure. Patient was prepped and draped in the usual sterile fashion. The correct patient, procedure, and site was verified.   Injection Procedure Details:  Procedure Site One Meds Administered:  Meds ordered this encounter  Medications   methylPREDNISolone acetate (DEPO-MEDROL) injection 40 mg     Laterality: Right  Location/Site:  L4-L5  Needle size: 22 guage  Needle type: Spinal  Needle Placement: Articular  Findings:  -Comments: Excellent flow of contrast producing a partial arthrogram.  Procedure Details: The fluoroscope beam is vertically oriented in AP, and the inferior recess is visualized beneath the lower pole of the inferior apophyseal process, which represents the target point for needle insertion. When direct visualization is difficult the target point is located at the medial projection of the vertebral pedicle. The region overlying each aforementioned target is locally anesthetized with a 1 to 2 ml. volume of 1% Lidocaine without Epinephrine.   The spinal needle was inserted into each of the above mentioned facet joints using biplanar fluoroscopic guidance. A 0.25 to 0.5 ml. volume of Isovue-250 was injected and a partial facet joint arthrogram was obtained. A single spot film was obtained of the resulting arthrogram.    One to 1.25 ml of the steroid/anesthetic solution was then injected into each of the facet joints noted above.   Additional Comments:  No complications occurred Dressing: 2 x 2 sterile gauze and Band-Aid    Post-procedure details: Patient was observed during  the procedure. Post-procedure instructions were reviewed.  Patient left the clinic in stable condition.

## 2023-07-15 NOTE — Progress Notes (Signed)
Patty Howard - 68 y.o. female MRN [id number]  Date of birth: [birth year]  Office Visit Note: Visit Date: 07/14/2023 PCP: Melvenia Beam, MD Referred by: Melvenia Beam,*  Subjective: Chief Complaint  Patient presents with   Lower Back - Pain   HPI: Patty Howard is a 68 y.o. female who comes in today for evaluation and management  for for chronic and worsening and severe right low back and hip pain.  By way of brief review she really is followed in our office for orthopedic standpoint by Dr. Annell Greening.  More recently she saw one of the physician assistants and completed a trochanter injection which was not very beneficial.  We ended up seeing her and completing an epidural injection which was somewhat helpful but is our last note indicated we felt like this may be a issue of facet mediated low back pain.  She has had imaging studies as well as medication management with really no relief.  She has had physical therapy in the past.  She has been in the emergency department twice in the last month for right sided low back pain.  She tells me today that she was under the care of Dr. Manon Hilding at [facility] pain clinic up until sometime around October.  She reports that she just did not go back there as she was starting to see Dr. Ophelia Charter for her hip.  She would like to go back and see them for more comprehensive pain management including injections if needed.  We did place a referral even though she was an established patient there.  Dr. Manon Hilding does not find job with injections as well and I expressed that to the patient today.  He does not have like complaints of focal weakness just extensive significant pain which is functionally limiting.     Review of Systems  Musculoskeletal:  Positive for back pain and joint pain.  All other systems reviewed and are negative.  Otherwise per HPI.  Assessment & Plan: Visit Diagnoses:    ICD-10-CM   1. Spondylosis without myelopathy or  radiculopathy, lumbar region  M47.816 XR C-ARM NO REPORT    Facet Injection    methylPREDNISolone acetate (DEPO-MEDROL) injection 40 mg    Ambulatory referral to Pain Clinic    2. Chronic right-sided low back pain without sciatica  M54.50    G89.29     3. Pain in right hip  M25.551     4. Chronic pain syndrome  G89.4        Plan: Findings:  Chronic history of low back pain with history of comprehensive pain management through [facility] and Dr. Manon Hilding.  She was seen in the office by Dr. Annell Greening from an orthopedic standpoint and ultimately had spine injection with [place] which was somewhat beneficial but she is still having significant enough pain that she has gone to the emergency room on 2 occasions.  It is right axial low back pain some referral buttock but not really into the groin or hip or leg.  Do the amount of pain she is having we did complete facet joint block today.  She can call [facility] and I did put a referral in there.  I would hope that Dr. Manon Hilding would kindly see her and complete injections as needed with possible radiofrequency ablation.  I did check the current controlled substance database and she has not had any opioid therapy since October.  If they decide not to take her back therapy  would make further arrangements for her if needed.    Meds & Orders:  Meds ordered this encounter  Medications   methylPREDNISolone acetate (DEPO-MEDROL) injection 40 mg    Orders Placed This Encounter  Procedures   Facet Injection   XR C-ARM NO REPORT   Ambulatory referral to Pain Clinic    Follow-up: No follow-ups on file.   Procedures: No procedures performed  Lumbar Facet Joint Intra-Articular Injection(s) with Fluoroscopic Guidance  Patient: Patty Howard      Date of Birth: 07-Mar-1956 MRN: [id number] PCP: Melvenia Beam, MD      Visit Date: 07/14/2023   Universal Protocol:    Date/Time: 07/14/2023  Consent Given By: the  patient  Position: PRONE   Additional Comments: Vital signs were monitored before and after the procedure. Patient was prepped and draped in the usual sterile fashion. The correct patient, procedure, and site was verified.   Injection Procedure Details:  Procedure Site One Meds Administered:  Meds ordered this encounter  Medications   methylPREDNISolone acetate (DEPO-MEDROL) injection 40 mg     Laterality: Right  Location/Site:  L4-L5  Needle size: 22 guage  Needle type: Spinal  Needle Placement: Articular  Findings:  -Comments: Excellent flow of contrast producing a partial arthrogram.  Procedure Details: The fluoroscope beam is vertically oriented in AP, and the inferior recess is visualized beneath the lower pole of the inferior apophyseal process, which represents the target point for needle insertion. When direct visualization is difficult the target point is located at the medial projection of the vertebral pedicle. The region overlying each aforementioned target is locally anesthetized with a 1 to 2 ml. volume of 1% Lidocaine without Epinephrine.   The spinal needle was inserted into each of the above mentioned facet joints using biplanar fluoroscopic guidance. A 0.25 to 0.5 ml. volume of Isovue-250 was injected and a partial facet joint arthrogram was obtained. A single spot film was obtained of the resulting arthrogram.    One to 1.25 ml of the steroid/anesthetic solution was then injected into each of the facet joints noted above.   Additional Comments:  No complications occurred Dressing: 2 x 2 sterile gauze and Band-Aid    Post-procedure details: Patient was observed during the procedure. Post-procedure instructions were reviewed.  Patient left the clinic in stable condition.    Clinical History: EXAM: MRI LUMBAR SPINE WITHOUT AND WITH CONTRAST   TECHNIQUE: Multiplanar and multiecho pulse sequences of the lumbar spine were obtained without and with  intravenous contrast.   CONTRAST:  20mL MULTIHANCE GADOBENATE DIMEGLUMINE 529 MG/ML IV SOLN   COMPARISON:  None.   FINDINGS: Segmentation:  Standard.   Alignment:  Trace anterolisthesis at L4-L5 and L5-S1.   Vertebrae: Vertebral body heights are maintained. There is no substantial marrow edema. No suspicious osseous lesion.   Conus medullaris and cauda equina: Conus extends to the T12-L1 level. Conus and cauda equina appear normal.   Paraspinal and other soft tissues: Unremarkable.   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   L1-L2: Prominent epidural fat mildly flattening the thecal sac. No foraminal stenosis.   L2-L3: Small left foraminal protrusion. Mild facet arthropathy. Prominent dorsal epidural fat mildly flattening the thecal sac. No right foraminal stenosis. Minor left foraminal stenosis.   L3-L4: Disc bulge. Marked right and mild left facet arthropathy with ligamentum flavum infolding. Moderate canal stenosis. Narrowing of the subarticular recesses. Mild foraminal stenosis.   L4-L5: Disc bulge. Marked facet arthropathy with prominent hypertrophic changes and  ligamentum flavum infolding. Marked canal stenosis. Narrowing of the subarticular recesses. Mild right foraminal stenosis. No left foraminal stenosis.   L5-S1: Disc bulge. Marked facet arthropathy with prominent hypertrophic changes and ligamentum flavum infolding. Minor canal stenosis. Mild foraminal stenosis.   IMPRESSION: Multilevel degenerative changes as detailed above. Canal stenosis is greatest L4-L5. There is marked lower lumbar facet arthropathy.     Electronically Signed   By: Guadlupe Spanish M.D.   On: 10/14/2020 10:29   She reports that she quit smoking about 25 years ago. Her smoking use included cigarettes. She started smoking about 35 years ago. She has a 5 pack-year smoking history. She has been exposed to tobacco smoke. She has never used smokeless tobacco. No results for  input(s): "HGBA1C", "LABURIC" in the last 8760 hours.  Objective:  VS:  HT:    WT:   BMI:     BP:   HR: bpm  TEMP: ( )  RESP:  Physical Exam Vitals and nursing note reviewed.  Constitutional:      General: She is not in acute distress.    Appearance: Normal appearance. She is well-developed. She is obese. She is not ill-appearing.  HENT:     Head: Normocephalic and atraumatic.     Right Ear: External ear normal.     Left Ear: External ear normal.  Eyes:     Extraocular Movements: Extraocular movements intact.     Conjunctiva/sclera: Conjunctivae normal.     Pupils: Pupils are equal, round, and reactive to light.  Cardiovascular:     Rate and Rhythm: Normal rate.     Pulses: Normal pulses.  Pulmonary:     Effort: Pulmonary effort is normal. No respiratory distress.  Abdominal:     General: There is no distension.     Palpations: Abdomen is soft.  Musculoskeletal:        General: Tenderness present.     Cervical back: Neck supple.     Right lower leg: No edema.     Left lower leg: No edema.     Comments: Patient has good distal strength with no pain over the greater trochanters.  No clonus or focal weakness.  Skin:    General: Skin is warm and dry.     Findings: No erythema, lesion or rash.  Neurological:     General: No focal deficit present.     Mental Status: She is alert and oriented to person, place, and time.     Sensory: No sensory deficit.     Motor: No weakness or abnormal muscle tone.     Coordination: Coordination normal.     Gait: Gait normal.  Psychiatric:        Mood and Affect: Mood normal.        Behavior: Behavior normal.     Ortho Exam  Imaging: No results found.  Past Medical/Family/Surgical/Social History: Medications & Allergies reviewed per EMR, new medications updated. 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) 10/08/2022   Perennial allergic rhinitis 10/08/2022   COPD exacerbation (HCC) 04/18/2022   Right hip pain 02/17/2022   Cough 12/05/2021   Type 2 diabetes mellitus without complication, without long-term  current use of insulin (HCC) 11/05/2021   Trigger finger, left middle finger 10/02/2021   COPD with acute exacerbation (HCC) 09/28/2021   Acute exacerbation of COPD with asthma (HCC) 09/27/2021   Other intervertebral disc degeneration, lumbar region 07/15/2021   History of total hip arthroplasty, left 03/07/2021   Trochanteric bursitis, left hip 12/23/2020   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   Diastolic dysfunction 11/25/2020   Asthma exacerbation 11/13/2020   Menopause 11/08/2020   Medication management 04/30/2020   Gastroesophageal reflux disease 03/14/2020   OSA (obstructive sleep apnea) 02/15/2020   Abnormal findings on diagnostic imaging of lung 01/08/2020   Educated about COVID-19 virus infection 11/30/2019   Aortic atherosclerosis (HCC) 11/30/2019   Chest pain 11/26/2019   Acute asthma exacerbation 05/22/2019   Left radial head fracture 02/03/2019   Closed fracture of radial head 01/29/2019   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) (HCC) 10/19/2018   Insomnia 10/19/2018   Elbow pain, chronic, left 10/19/2018   Dyspnea 10/12/2017   Pancolitis (HCC) 05/20/2017   H/O Spinal surgery 01/18/2015   Essential hypertension 01/18/2015   Obesity 01/18/2015   Past Medical History:  Diagnosis Date   Anxiety    Arthritis    Asthma    COPD (chronic obstructive pulmonary disease) (HCC)    Fracture    closed displaced left radial head   GERD (gastroesophageal reflux disease)    Hypertension    Sleep apnea    does not wear CPAP    Family History  Problem Relation Age of Onset   Hypertension Mother    Stroke Mother    Lung cancer Father    Colon cancer Neg Hx    Stomach cancer Neg Hx    Pancreatic cancer Neg Hx    Past Surgical History:  Procedure Laterality Date   BACK SURGERY     spinal stimulator   BIOPSY  11/10/2022   Procedure: BIOPSY;  Surgeon: Napoleon Form, MD;  Location: [facility];  Service: Gastroenterology;;   BREAST BIOPSY Right 2017   benign   COLONOSCOPY W/ BIOPSIES AND POLYPECTOMY     COLONOSCOPY WITH PROPOFOL N/A 11/10/2022   Procedure: COLONOSCOPY WITH PROPOFOL;  Surgeon: Napoleon Form, MD;  Location: [facility];  Service: Gastroenterology;  Laterality: N/A;   DILATION AND CURETTAGE OF UTERUS     ESOPHAGOGASTRODUODENOSCOPY (EGD) WITH PROPOFOL N/A 11/10/2022   Procedure: ESOPHAGOGASTRODUODENOSCOPY (EGD) WITH PROPOFOL;  Surgeon: Napoleon Form, MD;  Location: [facility];  Service: Gastroenterology;  Laterality: N/A;   LAPAROSCOPIC ROUX-EN-Y GASTRIC BYPASS WITH UPPER ENDOSCOPY AND REMOVAL OF LAP BAND     POLYPECTOMY  11/10/2022   Procedure: POLYPECTOMY;  Surgeon: Napoleon Form, MD;  Location: [facility];  Service: Gastroenterology;;   RADIAL HEAD ARTHROPLASTY Left 02/03/2019   Procedure: LEFT RADIAL HEAD ARTHROPLASTY;  Surgeon: Eldred Manges, MD;  Location: [facility];  Service: Orthopedics;  Laterality: Left;  AXILLARY BLOCK VS BIER BLOCK   TOTAL HIP ARTHROPLASTY Left 01/22/2021   Procedure: LEFT TOTAL HIP ARTHROPLASTY ANTERIOR APPROACH;  Surgeon: Eldred Manges, MD;  Location: [facility];  Service: Orthopedics;  Laterality: Left;   TUBAL LIGATION     Social History   Occupational History   Not on file  Tobacco Use   Smoking status: Former    Current packs/day: 0.00    Average packs/day: 0.5 packs/day for 10.0 years (5.0 ttl pk-yrs)    Types: Cigarettes    Start date: 06/15/1988    Quit date: 06/15/1998    Years  since quitting: 25.0    Passive exposure: Past   Smokeless  tobacco: Never  Vaping Use   Vaping status: Never Used  Substance and Sexual Activity   Alcohol use: Never   Drug use: Never   Sexual activity: Yes

## 2023-07-22 ENCOUNTER — Telehealth: Payer: Self-pay | Admitting: Pulmonary Disease

## 2023-07-22 NOTE — Telephone Encounter (Signed)
 Call patient  Sleep study result  Date of study: 07/14/2023  Impression: Mild obstructive sleep apnea with mild oxygen  desaturations  Recommendation: Options of treatment for mild obstructive sleep apnea will include  1.  CPAP therapy if there is significant daytime sleepiness or other comorbidities including history of CVA or cardiac disease -If CPAP is chosen as an option of treatment auto titrating CPAP with a pressure setting of 5-15 will be appropriate  2.  Watchful waiting with emphasis on weight loss measures, sleep position modification to optimize lateral sleep, elevating the head of the bed by about 30 degrees may also help.  3.  An oral device may be fashioned for the treatment of mild sleep disordered breathing, will involve referral to dentist.   Follow-up as previously scheduled

## 2023-08-06 NOTE — Telephone Encounter (Signed)
[name] x1.  LVM to return call. 

## 2023-08-09 NOTE — Telephone Encounter (Signed)
 Patient is returning missed call for sleep study results. [phone]

## 2023-08-10 ENCOUNTER — Ambulatory Visit (INDEPENDENT_AMBULATORY_CARE_PROVIDER_SITE_OTHER): Payer: Medicare HMO | Admitting: *Deleted

## 2023-08-10 DIAGNOSIS — J4550 Severe persistent asthma, uncomplicated: Secondary | ICD-10-CM | POA: Diagnosis not present

## 2023-08-10 NOTE — Telephone Encounter (Signed)
 Patient presented to the office at 12 pm requesting the results of her sleep study.  I printed out her sleep study and Dr. Trena Platt interpretation to go over with her.  When I got to the lobby, she was gone.  Annmargaret the front staff said she would mail it to her.  Nothing further needed.

## 2023-08-17 ENCOUNTER — Telehealth: Payer: Self-pay | Admitting: Cardiology

## 2023-08-17 NOTE — Telephone Encounter (Signed)
 Attempted to call patient, no answer left message requesting a call back.

## 2023-08-17 NOTE — Telephone Encounter (Signed)
 Pt c/o swelling/edema: STAT if pt has developed SOB within 24 hours  If swelling, where is the swelling located? Top of right foot from toes to ankle  How much weight have you gained and in what time span? none  Have you gained 2 pounds in a day or 5 pounds in a week? none  Do you have a log of your daily weights (if so, list)? no  Are you currently taking a fluid pill? yes  Are you currently SOB? Slight sob that started x 1 wk  Have you traveled recently in a car or plane for an extended period of time? no

## 2023-08-18 NOTE — Telephone Encounter (Signed)
 2nd attempt to call patient, no answer left message requesting a call back.

## 2023-08-19 ENCOUNTER — Ambulatory Visit: Payer: Medicare HMO | Admitting: Allergy & Immunology

## 2023-08-19 NOTE — Telephone Encounter (Signed)
 3rd attempt to call patient, no answer, left message requesting a call back Nursing will await for patient to return call

## 2023-08-24 ENCOUNTER — Ambulatory Visit: Admitting: Orthopaedic Surgery

## 2023-08-24 ENCOUNTER — Encounter: Payer: Self-pay | Admitting: Orthopaedic Surgery

## 2023-08-24 ENCOUNTER — Ambulatory Visit (INDEPENDENT_AMBULATORY_CARE_PROVIDER_SITE_OTHER): Admitting: Orthopaedic Surgery

## 2023-08-24 VITALS — BP 133/77 | HR 106 | Ht 68.0 in | Wt 270.0 lb

## 2023-08-24 DIAGNOSIS — G8929 Other chronic pain: Secondary | ICD-10-CM

## 2023-08-24 DIAGNOSIS — M545 Low back pain, unspecified: Secondary | ICD-10-CM | POA: Diagnosis not present

## 2023-08-24 NOTE — Progress Notes (Unsigned)
 Office Visit Note   Patient: Patty Howard           Date of Birth: Oct 15, 1955           MRN: [id number] Visit Date: 08/24/2023              Requested by: Melvenia Beam, MD [address]  [address] [place],  [place] [address] PCP: Melvenia Beam, MD   Assessment & Plan: Visit Diagnoses: No diagnosis found.  Plan: Will proceed with MRI scan for evaluated for progressive L4-5 stenosis with more right than left leg symptoms.  She states the pain is severe she is having trouble walking.  Her x-rays that showed some degenerative grade 1 anterolisthesis with facet arthropathy .  Follow-Up Instructions: No follow-ups on file.   Orders:  No orders of the defined types were placed in this encounter.  No orders of the defined types were placed in this encounter.     Procedures: No procedures performed   Clinical Data: No additional findings.   Subjective: Chief Complaint  Patient presents with   Lower Back - Pain    HPI 68 year old female returns with ongoing back pain L4-5 stenosis with right buttocks and leg pain greater than the left.  Past history of facet injections 07/14/2023 and lumbar epidural 06/07/2023 and states she only got a few days relief.  Pain radiates down the back of both legs right and left.  Buttocks pain is worse on the right than left.  She is having to ambulate with a cane she used some oxycodone for pain management but does not want to take more than 1 or 2 a day.  She has used ibuprofen without relief she is wearing multiple lidocaine patches and we discussed maximum dosage and risk for lidocaine toxicity.  Previous left total hip arthroplasty doing well.  MRI showed some gluteus minimus and moderate gluteus medius atrophy with tendinopathy.  BMI was 40 now 41.  Review of Systems all other systems update BMI is 41 positive for morbid obesity.  History of COPD type 2 diabetes not on insulin.  All other systems negative as a  pertains to HPI.  Previous lap band surgery.   Objective: Vital Signs: BP 133/77   Pulse (!) 106   Ht 5\' 8"  (1.727 m)   Wt 270 lb (122.5 kg)   BMI 41.05 kg/m   Physical Exam Constitutional:      Appearance: She is well-developed.  HENT:     Head: Normocephalic.     Right Ear: External ear normal.     Left Ear: External ear normal. There is no impacted cerumen.  Eyes:     Pupils: Pupils are equal, round, and reactive to light.  Neck:     Thyroid: No thyromegaly.     Trachea: No tracheal deviation.  Cardiovascular:     Rate and Rhythm: Normal rate.  Pulmonary:     Effort: Pulmonary effort is normal.  Abdominal:     Palpations: Abdomen is soft.  Musculoskeletal:     Cervical back: No rigidity.  Skin:    General: Skin is warm and dry.  Neurological:     Mental Status: She is alert and oriented to person, place, and time.  Psychiatric:        Behavior: Behavior normal.     Ortho Exam patient has sciatic notch tenderness worse on the right than the left some pain with straight leg raising 90 degrees knee and ankle jerk are  intact.  Healed left total hip and incision good range of motion no trochanteric bursal tenderness.  Specialty Comments:  EXAM: MRI LUMBAR SPINE WITHOUT AND WITH CONTRAST   TECHNIQUE: Multiplanar and multiecho pulse sequences of the lumbar spine were obtained without and with intravenous contrast.   CONTRAST:  20mL MULTIHANCE GADOBENATE DIMEGLUMINE 529 MG/ML IV SOLN   COMPARISON:  None.   FINDINGS: Segmentation:  Standard.   Alignment:  Trace anterolisthesis at L4-L5 and L5-S1.   Vertebrae: Vertebral body heights are maintained. There is no substantial marrow edema. No suspicious osseous lesion.   Conus medullaris and cauda equina: Conus extends to the T12-L1 level. Conus and cauda equina appear normal.   Paraspinal and other soft tissues: Unremarkable.   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   L1-L2: Prominent  epidural fat mildly flattening the thecal sac. No foraminal stenosis.   L2-L3: Small left foraminal protrusion. Mild facet arthropathy. Prominent dorsal epidural fat mildly flattening the thecal sac. No right foraminal stenosis. Minor left foraminal stenosis.   L3-L4: Disc bulge. Marked right and mild left facet arthropathy with ligamentum flavum infolding. Moderate canal stenosis. Narrowing of the subarticular recesses. Mild foraminal stenosis.   L4-L5: Disc bulge. Marked facet arthropathy with prominent hypertrophic changes and ligamentum flavum infolding. Marked canal stenosis. Narrowing of the subarticular recesses. Mild right foraminal stenosis. No left foraminal stenosis.   L5-S1: Disc bulge. Marked facet arthropathy with prominent hypertrophic changes and ligamentum flavum infolding. Minor canal stenosis. Mild foraminal stenosis.   IMPRESSION: Multilevel degenerative changes as detailed above. Canal stenosis is greatest L4-L5. There is marked lower lumbar facet arthropathy.     Electronically Signed   By: Guadlupe Spanish M.D.   On: 10/14/2020 10:29  Imaging: No results found.   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) 10/08/2022   Perennial allergic rhinitis 10/08/2022   COPD exacerbation (HCC) 04/18/2022   Right hip pain 02/17/2022   Cough 12/05/2021   Type 2 diabetes mellitus without complication, without long-term current use of insulin (HCC) 11/05/2021   Trigger finger, left middle finger 10/02/2021   COPD with acute exacerbation (HCC) 09/28/2021   Acute exacerbation of COPD with  asthma (HCC) 09/27/2021   Other intervertebral disc degeneration, lumbar region 07/15/2021   History of total hip arthroplasty, left 03/07/2021   Trochanteric bursitis, left hip 12/23/2020   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   Diastolic dysfunction 11/25/2020   Asthma exacerbation 11/13/2020   Menopause 11/08/2020   Medication management 04/30/2020   Gastroesophageal reflux disease 03/14/2020   OSA (obstructive sleep apnea) 02/15/2020   Abnormal findings on diagnostic imaging of lung 01/08/2020   Educated about COVID-19 virus infection 11/30/2019   Aortic atherosclerosis (HCC) 11/30/2019   Chest pain 11/26/2019   Acute asthma exacerbation 05/22/2019   Left radial head fracture 02/03/2019   Closed fracture of radial head 01/29/2019   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) (HCC) 10/19/2018   Insomnia 10/19/2018   Elbow pain, chronic, left 10/19/2018   Dyspnea 10/12/2017   Pancolitis (HCC) 05/20/2017   H/O Spinal surgery 01/18/2015   Essential hypertension 01/18/2015   Obesity 01/18/2015   Past Medical History:  Diagnosis  Date   Anxiety    Arthritis    Asthma    COPD (chronic obstructive pulmonary disease) (HCC)    Fracture    closed displaced left radial head   GERD (gastroesophageal reflux disease)    Hypertension    Sleep apnea    does not wear CPAP    Family History  Problem Relation Age of Onset   Hypertension Mother    Stroke Mother    Lung cancer Father    Colon cancer Neg Hx    Stomach cancer Neg Hx    Pancreatic cancer Neg Hx     Past Surgical History:  Procedure Laterality Date   BACK SURGERY     spinal stimulator   BIOPSY  11/10/2022   Procedure: BIOPSY;  Surgeon: Napoleon Form, MD;  Location: [facility];  Service: Gastroenterology;;   BREAST BIOPSY Right 2017   benign   COLONOSCOPY W/ BIOPSIES AND POLYPECTOMY     COLONOSCOPY WITH PROPOFOL N/A 11/10/2022   Procedure: COLONOSCOPY WITH PROPOFOL;  Surgeon: Napoleon Form, MD;   Location: [facility];  Service: Gastroenterology;  Laterality: N/A;   DILATION AND CURETTAGE OF UTERUS     ESOPHAGOGASTRODUODENOSCOPY (EGD) WITH PROPOFOL N/A 11/10/2022   Procedure: ESOPHAGOGASTRODUODENOSCOPY (EGD) WITH PROPOFOL;  Surgeon: Napoleon Form, MD;  Location: [facility];  Service: Gastroenterology;  Laterality: N/A;   LAPAROSCOPIC ROUX-EN-Y GASTRIC BYPASS WITH UPPER ENDOSCOPY AND REMOVAL OF LAP BAND     POLYPECTOMY  11/10/2022   Procedure: POLYPECTOMY;  Surgeon: Napoleon Form, MD;  Location: [facility];  Service: Gastroenterology;;   RADIAL HEAD ARTHROPLASTY Left 02/03/2019   Procedure: LEFT RADIAL HEAD ARTHROPLASTY;  Surgeon: Eldred Manges, MD;  Location: [facility];  Service: Orthopedics;  Laterality: Left;  AXILLARY BLOCK VS BIER BLOCK   TOTAL HIP ARTHROPLASTY Left 01/22/2021   Procedure: LEFT TOTAL HIP ARTHROPLASTY ANTERIOR APPROACH;  Surgeon: Eldred Manges, MD;  Location: [facility];  Service: Orthopedics;  Laterality: Left;   TUBAL LIGATION     Social History   Occupational History   Not on file  Tobacco Use   Smoking status: Former    Current packs/day: 0.00    Average packs/day: 0.5 packs/day for 10.0 years (5.0 ttl pk-yrs)    Types: Cigarettes    Start date: 06/15/1988    Quit date: 06/15/1998    Years since quitting: 25.2    Passive exposure: Past   Smokeless tobacco: Never  Vaping Use   Vaping status: Never Used  Substance and Sexual Activity   Alcohol use: Never   Drug use: Never   Sexual activity: Yes

## 2023-09-02 ENCOUNTER — Other Ambulatory Visit: Payer: MEDICARE

## 2023-09-06 ENCOUNTER — Ambulatory Visit
Admission: RE | Admit: 2023-09-06 | Discharge: 2023-09-06 | Disposition: A | Discharge status: Home / Self Care | Source: Ambulatory Visit | Attending: Orthopaedic Surgery | Admitting: Orthopaedic Surgery

## 2023-09-06 DIAGNOSIS — M545 Low back pain, unspecified: Secondary | ICD-10-CM

## 2023-09-07 ENCOUNTER — Ambulatory Visit: Payer: Medicare HMO

## 2023-09-14 ENCOUNTER — Ambulatory Visit (INDEPENDENT_AMBULATORY_CARE_PROVIDER_SITE_OTHER): Admitting: *Deleted

## 2023-09-14 DIAGNOSIS — J4550 Severe persistent asthma, uncomplicated: Secondary | ICD-10-CM

## 2023-09-14 NOTE — Progress Notes (Unsigned)
 Cardiology Clinic Note   Patient Name: Patty Howard Date of Encounter: 09/16/2023  Primary Care Provider:  Melvenia Beam, MD Primary Cardiologist:  Rollene Rotunda, MD  Patient Profile    Patty Howard 68 year old female presents the clinic today for follow-up evaluation of her DOE and Chronic HF with preserved EF.  Past Medical History    Past Medical History:  Diagnosis Date   Anxiety    Arthritis    Asthma    COPD (chronic obstructive pulmonary disease) (HCC)    Fracture    closed displaced left radial head   GERD (gastroesophageal reflux disease)    Hypertension    Sleep apnea    does not wear CPAP   Past Surgical History:  Procedure Laterality Date   BACK SURGERY     spinal stimulator   BIOPSY  11/10/2022   Procedure: BIOPSY;  Surgeon: Napoleon Form, MD;  Location: [facility];  Service: Gastroenterology;;   BREAST BIOPSY Right 2017   benign   COLONOSCOPY W/ BIOPSIES AND POLYPECTOMY     COLONOSCOPY WITH PROPOFOL N/A 11/10/2022   Procedure: COLONOSCOPY WITH PROPOFOL;  Surgeon: Napoleon Form, MD;  Location: [facility];  Service: Gastroenterology;  Laterality: N/A;   DILATION AND CURETTAGE OF UTERUS     ESOPHAGOGASTRODUODENOSCOPY (EGD) WITH PROPOFOL N/A 11/10/2022   Procedure: ESOPHAGOGASTRODUODENOSCOPY (EGD) WITH PROPOFOL;  Surgeon: Napoleon Form, MD;  Location: [facility];  Service: Gastroenterology;  Laterality: N/A;   LAPAROSCOPIC ROUX-EN-Y GASTRIC BYPASS WITH UPPER ENDOSCOPY AND REMOVAL OF LAP BAND     POLYPECTOMY  11/10/2022   Procedure: POLYPECTOMY;  Surgeon: Napoleon Form, MD;  Location: [facility];  Service: Gastroenterology;;   RADIAL HEAD ARTHROPLASTY Left 02/03/2019   Procedure: LEFT RADIAL HEAD ARTHROPLASTY;  Surgeon: Eldred Manges, MD;  Location: [facility];  Service: Orthopedics;  Laterality: Left;  AXILLARY BLOCK VS BIER BLOCK   TOTAL HIP ARTHROPLASTY Left 01/22/2021   Procedure: LEFT TOTAL HIP  ARTHROPLASTY ANTERIOR APPROACH;  Surgeon: Eldred Manges, MD;  Location: [facility];  Service: Orthopedics;  Laterality: Left;   TUBAL LIGATION      Allergies  No Active Allergies  History of Present Illness    Patty Howard is a PMH of OSA on CPAP, COPD, asthma, and  HFpEF.  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  She establish care with Dr. Antoine Poche on 12/01/2019.  She was being seen for shortness of breath and chest discomfort.  She had been hospitalized for 2 days prior to her clinic visit with chest discomfort and shortness of breath.  She was treated for HFpEF.  Her echocardiogram 6/21 showed an LVEF of 65-70%, G1 DD, no regional wall motion abnormalities and no significant valvular disease.  She had nuclear stress testing which was negative for ischemia.  She was noted to have aortic atherosclerosis on CTA 2021.  She was also noted to have pulmonary nodules that were identified on CT which are followed by pulmonology.  She was hospitalized 4/23 with COPD exacerbation secondary to influenza A infection.  She was treated with Tamiflu and doxycycline.  She was discharged on prednisone taper.  She was seen in follow-up by cardiology 12/21.  She has a history of nonadherence with her medications.  During that time she continued to take her furosemide as needed.  She presented for follow-up on 10/21/2021 with Angie Duke PA-C.  She was out of her medications at that time.  She noted dyspnea on exertion  which was felt to be in part related to her asthma.  She continues to follow with pulmonology and allergy.  She had not been going to the gym for the previous 6 weeks due to dyspnea with exertion.  Her dry weight was noted to be 281 pounds.  She was following with a weight management program.  Her echocardiogram was repeated and showed normal LVEF, moderate concentric LVH, G1 DD, mild dilation of her left atria and no significant valvular abnormalities.  Her medications were refilled and follow-up  was planned for around 6 months.  She presented to the clinic 06/18/22 for follow-up evaluation stated she continued to have shortness of breath with increased physical activity.  She denied shortness of breath at rest.  We reviewed her echocardiogram and she expressed understanding.  She continued to be fairly sedentary. She had a follow-up appointment scheduled with GI for evaluation of her reflux in the near future.I planned follow-up in 6 months.  She was seen in follow-up by Dr. Antoine Poche 01/20/2023.  During that time she remained stable from a cardiac standpoint.  She denied anginal symptoms.  She denied new shortness of breath.  She had not been having palpitations presyncope or syncope.  Her EKG showed normal sinus rhythm with sinus arrhythmia and nonspecific ST abnormality 89 bpm.  Her blood pressure was well-controlled.  She presents to the clinic today for follow-up evaluation and states she continues to do well.  She has been staying active going to the gym 2 times per week and going to senior aerobics.  She continues to travel to [place] several times per year to visit family.  She will sometimes fly and other times will drive.  Her breathing is stable today.  We reviewed her EKG.  She expressed understanding.  I will request her recent lab work from her PCP, give her salty 6 diet sheet, have her maintain her physical activity and plan follow-up in 6 months..  Today she denies chest pain, increased shortness of breath, lower extremity edema, fatigue, palpitations, melena, hematuria, hemoptysis, diaphoresis, weakness, presyncope, syncope, orthopnea, and PND.     Home Medications    Prior to Admission medications   Medication Sig Start Date End Date Taking? Authorizing Provider  albuterol (PROVENTIL) (2.5 MG/3ML) 0.083% nebulizer solution Take 3 mLs (2.5 mg total) by nebulization every 6 (six) hours as needed for wheezing or shortness of breath. 04/23/21   Verlee Monte, MD  albuterol  (VENTOLIN HFA) 108 (90 Base) MCG/ACT inhaler Inhale 1-2 puffs into the lungs every 6 (six) hours as needed for wheezing or shortness of breath. 11/27/21   Alfonse Spruce, MD  amLODipine (NORVASC) 5 MG tablet Take 1 tablet (5 mg total) by mouth daily. 10/21/21   Duke, Roe Rutherford, PA  amoxicillin-clavulanate (AUGMENTIN) 875-125 MG tablet Take 1 tablet by mouth 2 (two) times daily. 04/23/22   Parrett, Virgel Bouquet, NP  ascorbic acid (VITAMIN C) 500 MG tablet Take 500 mg by mouth daily.    [provider]  atorvastatin (LIPITOR) 20 MG tablet Take 1 tablet (20 mg total) by mouth daily. 10/23/21   Duke, Roe Rutherford, PA  benzonatate (TESSALON) 200 MG capsule Take 1 capsule (200 mg total) by mouth 3 (three) times daily as needed for cough. 04/23/22   Parrett, Virgel Bouquet, NP  Dupilumab (DUPIXENT [place]) Inject 1 Syringe into the skin every 14 (fourteen) days.    [provider]  Fluticasone-Umeclidin-Vilant (TRELEGY ELLIPTA) 200-62.5-25 MCG/ACT AEPB Inhale 1 puff daily  Patient not taking: Reported on 04/29/2022 12/05/21   [place], Buren Kos, MD  Fluticasone-Umeclidin-Vilant (TRELEGY ELLIPTA) 200-62.5-25 MCG/ACT AEPB Inhale 1 puff into the lungs daily at 6 (six) AM. 04/23/22   Parrett, Virgel Bouquet, NP  Fluticasone-Umeclidin-Vilant (TRELEGY ELLIPTA) 200-62.5-25 MCG/ACT AEPB Inhale 1 puff into the lungs daily at 6 (six) AM. Patient not taking: Reported on 04/29/2022 04/23/22   Parrett, Virgel Bouquet, NP  furosemide (LASIX) 40 MG tablet Take 40 mg by mouth daily.    [provider]  guaiFENesin-codeine 100-10 MG/5ML syrup Take 5 mLs by mouth 3 (three) times daily as needed for cough. 04/29/22   Tomma Lightning, MD  ibuprofen (ADVIL) 800 MG tablet Take 1 tablet (800 mg total) by mouth every 8 (eight) hours as needed for moderate pain. 02/17/22   [place], Buren Kos, MD  ipratropium-albuterol (DUONEB) 0.5-2.5 (3) MG/3ML SOLN Take 3 mLs by nebulization every 6 (six) hours as needed. Patient taking  differently: Take 3 mLs by nebulization every 6 (six) hours as needed (Wheezing, shortness of breath). 04/23/21   Verlee Monte, MD  levocetirizine (XYZAL) 5 MG tablet Take 1 tablet (5 mg total) by mouth every evening. 04/23/21   Verlee Monte, MD  levothyroxine (SYNTHROID) 100 MCG tablet Take 100 mcg by mouth daily before breakfast.    [provider]  LINZESS 290 MCG CAPS capsule Take 290 mcg by mouth daily. 03/17/22   [provider]  losartan-hydrochlorothiazide (HYZAAR) 100-25 MG tablet Take 1 tablet by mouth daily. 10/21/21   Duke, Roe Rutherford, PA  Multiple Vitamin (MULTIVITAMIN WITH MINERALS) TABS tablet Take 1 tablet by mouth daily. Centrum Multivitamin    [provider]  pantoprazole (PROTONIX) 40 MG tablet Take 1 tablet (40 mg total) by mouth daily. 11/27/21   Alfonse Spruce, MD  potassium chloride SA (KLOR-CON M) 20 MEQ tablet Take 1 tablet (20 mEq total) by mouth daily. 09/30/21 04/17/22  Dorcas Carrow, MD  Semaglutide, 1 MG/DOSE, 4 MG/3ML SOPN Inject 1 mg as directed once a week. Patient taking differently: Inject 1 mg into the skin once a week. 02/17/22   [place], Buren Kos, MD  traMADol (ULTRAM) 50 MG tablet Take 1 tablet (50 mg total) by mouth every 12 (twelve) hours as needed. 02/27/22   Eldred Manges, MD  Vitamin D, Ergocalciferol, (DRISDOL) 1.25 MG (50000 UNIT) CAPS capsule Take 50,000 Units by mouth every Monday.  10/27/19   [provider]  zafirlukast (ACCOLATE) 20 MG tablet TAKE 1 TABLET (20 MG TOTAL) BY MOUTH 2 (TWO) TIMES DAILY BEFORE A MEAL. Patient taking differently: Take 20 mg by mouth 2 (two) times daily. 02/20/22 04/17/22  Alfonse Spruce, MD  zolpidem (AMBIEN CR) 6.25 MG CR tablet Take 1 tablet (6.25 mg total) by mouth at bedtime as needed for sleep. 04/29/22   Tomma Lightning, MD    Family History    Family History  Problem Relation Age of Onset   Hypertension Mother    Stroke Mother    Lung cancer Father    Colon  cancer Neg Hx    Stomach cancer Neg Hx    Pancreatic cancer Neg Hx    She indicated that her mother is deceased. She indicated that her father is deceased. She indicated that the status of her neg hx is unknown.  Social History    Social History   Socioeconomic History   Marital status: Widowed    Spouse name: Not on file  Number of children: Not on file   Years of education: Not on file   Highest education level: Not on file  Occupational History   Not on file  Tobacco Use   Smoking status: Former    Current packs/day: 0.00    Average packs/day: 0.5 packs/day for 10.0 years (5.0 ttl pk-yrs)    Types: Cigarettes    Start date: 06/15/1988    Quit date: 06/15/1998    Years since quitting: 25.2    Passive exposure: Past   Smokeless tobacco: Never  Vaping Use   Vaping status: Never Used  Substance and Sexual Activity   Alcohol use: Never   Drug use: Never   Sexual activity: Yes  Other Topics Concern   Not on file  Social History Narrative   Three children.  Emergency planning/management officer in [place].     Social Drivers of Corporate investment banker Strain: Not on file  Food Insecurity: Not on file  Transportation Needs: Not on file  Physical Activity: Not on file  Stress: Not on file  Social Connections: Not on file  Intimate Partner Violence: Not At Risk (09/26/2022)   [facility]   Domestic Partner Violence    Do you feel safe in your home?: Not on file    Do you feel safe in your current relationship?: Not on file     Review of Systems    General:  No chills, fever, night sweats or weight changes.  Cardiovascular:  No chest pain, dyspnea on exertion, edema, orthopnea, palpitations, paroxysmal nocturnal dyspnea. Dermatological: No rash, lesions/masses Respiratory: No cough, dyspnea Urologic: No hematuria, dysuria Abdominal:   No nausea, vomiting, diarrhea, bright red blood per rectum, melena, or hematemesis Neurologic:  No visual changes, wkns, changes  in mental status. All other systems reviewed and are otherwise negative except as noted above.  Physical Exam    VS:  BP 120/70 (BP Location: Right Arm, Patient Position: Sitting, Cuff Size: Large)   Pulse 94   Ht 5\' 8"  (1.727 m)   Wt 278 lb (126.1 kg)   SpO2 96%   BMI 42.27 kg/m  , BMI Body mass index is 42.27 kg/m. GEN: Well nourished, well developed, in no acute distress. HEENT: normal. Neck: Supple, no JVD, carotid bruits, or masses. Cardiac: RRR, no murmurs, rubs, or gallops. No clubbing, cyanosis, edema.  Radials/DP/PT 2+ and equal bilaterally.  Respiratory:  Respirations regular and unlabored, clear to auscultation bilaterally. GI: Soft, nontender, nondistended, BS + x 4. MS: no deformity or atrophy. Skin: warm and dry, no rash. Neuro:  Strength and sensation are intact. Psych: Normal affect.  Accessory Clinical Findings    Recent Labs: No results found for requested labs within last 365 days.   Recent Lipid Panel    Component Value Date/Time   CHOL 197 10/21/2021 1536   TRIG 56 10/21/2021 1536   HDL 77 10/21/2021 1536   CHOLHDL 2.6 10/21/2021 1536   LDLCALC 110 (H) 10/21/2021 1536   LDLDIRECT 107 (H) 10/21/2021 1536         ECG personally reviewed by me today-EKG Interpretation Date/Time:  Thursday September 16 2023 09:23:39 EDT Ventricular Rate:  94 PR Interval:  142 QRS Duration:  86 QT Interval:  384 QTC Calculation: 480 R Axis:   45  Text Interpretation: Sinus rhythm with Premature supraventricular complexes Confirmed by Edd Fabian [phone]) on 09/16/2023 9:25:48 AM   Nuclear stress test 12/20/2019 The left ventricular ejection fraction is hyperdynamic (>65%). Nuclear  stress EF: 65%. There was no ST segment deviation noted during stress. No T wave inversion was noted during stress. The study is normal. This is a low risk study.   1. Slightly reduced counts in the apex on rest and stress with normal wall motion consistent with apical thinning  artifact.  2. Normal myocardial perfusion imaging study without ischemia or infarction.  3. TID ratio is upper limits of normal for pharmacological stress. Visually, there is no TID present. Suspect artifactual due to small LV cavity.  4. Normal LVEF, 65%.  5. This is a low-risk study.      Echocardiogram 11/11/2021  IMPRESSIONS     1. Left ventricular ejection fraction, by estimation, is 60 to 65%. The  left ventricle has normal function. The left ventricle has no regional  wall motion abnormalities. There is moderate concentric left ventricular  hypertrophy. Left ventricular  diastolic parameters are consistent with Grade I diastolic dysfunction  (impaired relaxation).   2. Right ventricular systolic function is normal. The right ventricular  size is normal. There is normal pulmonary artery systolic pressure.   3. Left atrial size was mildly dilated.   4. The mitral valve is normal in structure. No evidence of mitral valve  regurgitation. No evidence of mitral stenosis.   5. The aortic valve is tricuspid. Aortic valve regurgitation is not  visualized. No aortic stenosis is present.   6. The inferior vena cava is normal in size with greater than 50%  respiratory variability, suggesting right atrial pressure of 3 mmHg.   FINDINGS   Left Ventricle: Left ventricular ejection fraction, by estimation, is 60  to 65%. The left ventricle has normal function. The left ventricle has no  regional wall motion abnormalities. The left ventricular internal cavity  size was normal in size. There is   moderate concentric left ventricular hypertrophy. Left ventricular  diastolic parameters are consistent with Grade I diastolic dysfunction  (impaired relaxation). Indeterminate filling pressures.   Right Ventricle: The right ventricular size is normal. No increase in  right ventricular wall thickness. Right ventricular systolic function is  normal. There is normal pulmonary artery systolic  pressure. The tricuspid  regurgitant velocity is 1.89 m/s, and   with an assumed right atrial pressure of 3 mmHg, the estimated right  ventricular systolic pressure is 17.3 mmHg.   Left Atrium: Left atrial size was mildly dilated.   Right Atrium: Right atrial size was normal in size.   Pericardium: There is no evidence of pericardial effusion.   Mitral Valve: The mitral valve is normal in structure. No evidence of  mitral valve regurgitation. No evidence of mitral valve stenosis.   Tricuspid Valve: The tricuspid valve is normal in structure. Tricuspid  valve regurgitation is trivial. No evidence of tricuspid stenosis.   Aortic Valve: The aortic valve is tricuspid. Aortic valve regurgitation is  not visualized. No aortic stenosis is present.   Pulmonic Valve: The pulmonic valve was normal in structure. Pulmonic valve  regurgitation is not visualized. No evidence of pulmonic stenosis.   Aorta: The aortic root is normal in size and structure.   Venous: The inferior vena cava is normal in size with greater than 50%  respiratory variability, suggesting right atrial pressure of 3 mmHg.   IAS/Shunts: No atrial level shunt detected by color flow Doppler.   Assessment & Plan   1.  HFpEF-weight stable.  She is euvolemic.  Denies any increased DOE or activity intolerance.   Continue furosemide, losartan, HCTZ, potassium Continue  heart healthy low-sodium diet Repeat echocardiogram when clinically indicated  Hyperlipidemia-LDL 107 10/21/21 Continue atorvastatin Heart healthy low-sodium high-fiber diet-reviewed Maintain physical activity Following with PCP-will request labs  Essential hypertension-BP today 120/70 Continue amlodipine, Hyzaar Heart healthy low-sodium diet-reviewed  DOE-breathing at baseline.  Reviewed importance of physical activity and pulmonary conditioning. Continue current medical therapy Follows with pulmonology  Type 2 diabetes-A1c 5.6 on last check. Carb  modified diet-reviewed Follows with PCP  Disposition: Follow-up with Dr. Antoine Poche or me in  6  months.   Thomasene Ripple. Deasha Clendenin NP-C     09/16/2023, 9:45 AM [facility] [address] Office [phone] Fax [phone]    I spent 14 minutes examining this patient, reviewing medications, and using patient centered shared decision making involving her cardiac care.  I spent  20 minutes reviewing her past medical history,  medications, and prior cardiac tests.

## 2023-09-16 ENCOUNTER — Encounter: Payer: Self-pay | Admitting: General Practice

## 2023-09-16 ENCOUNTER — Ambulatory Visit: Attending: General Practice | Admitting: General Practice

## 2023-09-16 VITALS — BP 120/70 | HR 94 | Ht 68.0 in | Wt 278.0 lb

## 2023-09-16 DIAGNOSIS — I10 Essential (primary) hypertension: Secondary | ICD-10-CM

## 2023-09-16 DIAGNOSIS — E785 Hyperlipidemia, unspecified: Secondary | ICD-10-CM

## 2023-09-16 DIAGNOSIS — I5030 Unspecified diastolic (congestive) heart failure: Secondary | ICD-10-CM | POA: Diagnosis not present

## 2023-09-16 DIAGNOSIS — R0609 Other forms of dyspnea: Secondary | ICD-10-CM

## 2023-09-16 MED ORDER — POTASSIUM CHLORIDE CRYS ER 20 MEQ PO TBCR: 20.0000 meq | EXTENDED_RELEASE_TABLET | Freq: Every day | ORAL | 3 refills | Status: AC

## 2023-09-16 MED ORDER — LOSARTAN POTASSIUM-HCTZ 100-25 MG PO TABS: 1.0000 | ORAL_TABLET | Freq: Every day | ORAL | 3 refills | Status: AC

## 2023-09-16 MED ORDER — ATORVASTATIN CALCIUM 20 MG PO TABS: 20.0000 mg | ORAL_TABLET | Freq: Every day | ORAL | 3 refills | Status: AC

## 2023-09-16 MED ORDER — AMLODIPINE BESYLATE 5 MG PO TABS: 5.0000 mg | ORAL_TABLET | Freq: Every day | ORAL | 3 refills | Status: AC

## 2023-09-16 MED ORDER — FUROSEMIDE 40 MG PO TABS
40.0000 mg | ORAL_TABLET | Freq: Every day | ORAL | 3 refills | Status: AC
Start: 2023-09-16 — End: ?

## 2023-09-16 NOTE — Patient Instructions (Signed)
 Medication Instructions:  MAY USE OVER-THE-COUNTER SIMETHICONE FOR GAS  The current medical regimen is effective;  continue present plan and medications as directed. Please refer to the Current Medication list given to you today.  *If you need a refill on your cardiac medications before your next appointment, please call your pharmacy*  Lab Work: WILL REQUEST FROM [facility] If you have labs (blood work) drawn today and your tests are completely normal, you will receive your results only by:  MyChart Message (if you have MyChart) OR A paper copy in the mail If you have any lab test that is abnormal or we need to change your treatment, we will call you to review the results.  Testing/Procedures: NONE  Follow-Up: At [facility], you and your health needs are our priority.  As part of our continuing mission to provide you with exceptional heart care, our providers are all part of one team.  This team includes your primary Cardiologist (physician) and Advanced Practice Providers or APPs (Physician Assistants and Nurse Practitioners) who all work together to provide you with the care you need, when you need it.  Your next appointment:   6 month(s)  Provider:   Rollene Rotunda, MD or Edd Fabian, NP          We recommend signing up for the patient portal called "M  Other Instructions ELEVATE LOW EXTREMITIES WHEN NOT ACTIVE PLEASE READ AND FOLLOW ATTACHED  SALTY 6  MAINTAIN PHYSICAL ACTIVITY           1st Floor: - Lobby - Registration  - Pharmacy  - Lab - Cafe  2nd Floor: - PV Lab - Diagnostic Testing (echo, CT, nuclear med)  3rd Floor: - Vacant  4th Floor: - TCTS (cardiothoracic surgery) - AFib Clinic - Structural Heart Clinic - Vascular Surgery  - Vascular Ultrasound  5th Floor: - HeartCare Cardiology (general and EP) - Clinical Pharmacy for coumadin, hypertension, lipid, weight-loss medications, and med management appointments    Valet  parking services will be available as well.

## 2023-09-28 ENCOUNTER — Other Ambulatory Visit: Payer: Self-pay

## 2023-09-28 ENCOUNTER — Emergency Department (HOSPITAL_BASED_OUTPATIENT_CLINIC_OR_DEPARTMENT_OTHER): Admitting: Radiology

## 2023-09-28 ENCOUNTER — Encounter (HOSPITAL_BASED_OUTPATIENT_CLINIC_OR_DEPARTMENT_OTHER): Payer: Self-pay

## 2023-09-28 ENCOUNTER — Observation Stay (HOSPITAL_BASED_OUTPATIENT_CLINIC_OR_DEPARTMENT_OTHER)
Admission: EM | Admit: 2023-09-28 | Discharge: 2023-09-29 | Disposition: A | Discharge status: Home / Self Care | Attending: Internal Medicine | Admitting: Internal Medicine

## 2023-09-28 DIAGNOSIS — M19031 Primary osteoarthritis, right wrist: Secondary | ICD-10-CM | POA: Insufficient documentation

## 2023-09-28 DIAGNOSIS — I110 Hypertensive heart disease with heart failure: Secondary | ICD-10-CM | POA: Insufficient documentation

## 2023-09-28 DIAGNOSIS — Z96642 Presence of left artificial hip joint: Secondary | ICD-10-CM | POA: Diagnosis not present

## 2023-09-28 DIAGNOSIS — M5442 Lumbago with sciatica, left side: Secondary | ICD-10-CM | POA: Insufficient documentation

## 2023-09-28 DIAGNOSIS — R531 Weakness: Secondary | ICD-10-CM

## 2023-09-28 DIAGNOSIS — M545 Low back pain, unspecified: Secondary | ICD-10-CM | POA: Diagnosis present

## 2023-09-28 DIAGNOSIS — J449 Chronic obstructive pulmonary disease, unspecified: Secondary | ICD-10-CM | POA: Diagnosis not present

## 2023-09-28 DIAGNOSIS — E785 Hyperlipidemia, unspecified: Secondary | ICD-10-CM | POA: Insufficient documentation

## 2023-09-28 DIAGNOSIS — J45909 Unspecified asthma, uncomplicated: Secondary | ICD-10-CM

## 2023-09-28 DIAGNOSIS — E039 Hypothyroidism, unspecified: Secondary | ICD-10-CM | POA: Insufficient documentation

## 2023-09-28 DIAGNOSIS — Z7985 Long-term (current) use of injectable non-insulin antidiabetic drugs: Secondary | ICD-10-CM | POA: Diagnosis not present

## 2023-09-28 DIAGNOSIS — I5032 Chronic diastolic (congestive) heart failure: Secondary | ICD-10-CM | POA: Insufficient documentation

## 2023-09-28 DIAGNOSIS — G8929 Other chronic pain: Principal | ICD-10-CM

## 2023-09-28 DIAGNOSIS — Z79899 Other long term (current) drug therapy: Secondary | ICD-10-CM | POA: Diagnosis not present

## 2023-09-28 DIAGNOSIS — M5416 Radiculopathy, lumbar region: Secondary | ICD-10-CM | POA: Diagnosis not present

## 2023-09-28 DIAGNOSIS — M5441 Lumbago with sciatica, right side: Secondary | ICD-10-CM | POA: Diagnosis present

## 2023-09-28 DIAGNOSIS — Z87891 Personal history of nicotine dependence: Secondary | ICD-10-CM | POA: Insufficient documentation

## 2023-09-28 LAB — SEDIMENTATION RATE: Sed Rate: 13 mm/h (ref 0–22)

## 2023-09-28 LAB — CBC WITH DIFFERENTIAL/PLATELET
Abs Immature Granulocytes: 0.01 10*3/uL (ref 0.00–0.07)
Basophils Absolute: 0.1 10*3/uL (ref 0.0–0.1)
Basophils Relative: 1 %
Eosinophils Absolute: 0.1 10*3/uL (ref 0.0–0.5)
Eosinophils Relative: 2 %
HCT: 35.1 % — ABNORMAL LOW (ref 36.0–46.0)
Hemoglobin: 11.2 g/dL — ABNORMAL LOW (ref 12.0–15.0)
Immature Granulocytes: 0 %
Lymphocytes Relative: 38 %
Lymphs Abs: 2.8 10*3/uL (ref 0.7–4.0)
MCH: 27.1 pg (ref 26.0–34.0)
MCHC: 31.9 g/dL (ref 30.0–36.0)
MCV: 84.8 fL (ref 80.0–100.0)
Monocytes Absolute: 0.4 10*3/uL (ref 0.1–1.0)
Monocytes Relative: 5 %
Neutro Abs: 4 10*3/uL (ref 1.7–7.7)
Neutrophils Relative %: 54 %
Platelets: 264 10*3/uL (ref 150–400)
RBC: 4.14 MIL/uL (ref 3.87–5.11)
RDW: 14.4 % (ref 11.5–15.5)
WBC: 7.4 10*3/uL (ref 4.0–10.5)
nRBC: 0 % (ref 0.0–0.2)

## 2023-09-28 LAB — BASIC METABOLIC PANEL WITH GFR
Anion gap: 8 (ref 5–15)
BUN: 14 mg/dL (ref 8–23)
CO2: 28 mmol/L (ref 22–32)
Calcium: 9.7 mg/dL (ref 8.9–10.3)
Chloride: 106 mmol/L (ref 98–111)
Creatinine, Ser: 0.55 mg/dL (ref 0.44–1.00)
GFR, Estimated: >60 mL/min (ref >60)
Glucose, Bld: 108 mg/dL — ABNORMAL HIGH (ref 70–99)
Potassium: 3.6 mmol/L (ref 3.5–5.1)
Sodium: 142 mmol/L (ref 135–145)

## 2023-09-28 LAB — HIV ANTIBODY (ROUTINE TESTING W REFLEX): HIV Screen 4th Generation wRfx: NONREACTIVE

## 2023-09-28 LAB — C-REACTIVE PROTEIN: CRP: 1 mg/dL — ABNORMAL HIGH (ref <1.0)

## 2023-09-28 MED ORDER — HYDROCHLOROTHIAZIDE 25 MG PO TABS
25.0000 mg | ORAL_TABLET | Freq: Every day | ORAL | Status: DC
  Administered 2023-09-29: 25 mg via ORAL
  Filled 2023-09-28: qty 1

## 2023-09-28 MED ORDER — AMLODIPINE BESYLATE 5 MG PO TABS
5.0000 mg | ORAL_TABLET | Freq: Every day | ORAL | Status: DC
  Administered 2023-09-28 – 2023-09-29 (×2): 5 mg via ORAL
  Filled 2023-09-28 (×2): qty 1

## 2023-09-28 MED ORDER — CYCLOBENZAPRINE HCL 5 MG PO TABS
5.0000 mg | ORAL_TABLET | Freq: Once | ORAL | Status: AC
  Administered 2023-09-28: 5 mg via ORAL
  Filled 2023-09-28: qty 1

## 2023-09-28 MED ORDER — IPRATROPIUM-ALBUTEROL 0.5-2.5 (3) MG/3ML IN SOLN
3.0000 mL | Freq: Four times a day (QID) | RESPIRATORY_TRACT | Status: DC | PRN
  Administered 2023-09-28 (×3): 3 mL via RESPIRATORY_TRACT
  Filled 2023-09-28 (×3): qty 3

## 2023-09-28 MED ORDER — BUDESON-GLYCOPYRROL-FORMOTEROL 160-9-4.8 MCG/ACT IN AERO
2.0000 | INHALATION_SPRAY | Freq: Two times a day (BID) | RESPIRATORY_TRACT | Status: DC
  Administered 2023-09-28 – 2023-09-29 (×2): 2 via RESPIRATORY_TRACT
  Filled 2023-09-28: qty 5.9

## 2023-09-28 MED ORDER — LOSARTAN POTASSIUM 50 MG PO TABS
100.0000 mg | ORAL_TABLET | Freq: Every day | ORAL | Status: DC
  Administered 2023-09-29: 100 mg via ORAL
  Filled 2023-09-28: qty 2

## 2023-09-28 MED ORDER — HYDROMORPHONE HCL 1 MG/ML IJ SOLN
0.5000 mg | INTRAMUSCULAR | Status: DC | PRN
  Filled 2023-09-28: qty 1

## 2023-09-28 MED ORDER — OXYCODONE-ACETAMINOPHEN 5-325 MG PO TABS
1.0000 | ORAL_TABLET | Freq: Once | ORAL | Status: AC
  Administered 2023-09-28: 1 via ORAL
  Filled 2023-09-28: qty 1

## 2023-09-28 MED ORDER — HYDROMORPHONE HCL 1 MG/ML IJ SOLN
1.0000 mg | Freq: Once | INTRAMUSCULAR | Status: AC
  Administered 2023-09-28: 1 mg via INTRAVENOUS
  Filled 2023-09-28: qty 1

## 2023-09-28 MED ORDER — DEXAMETHASONE SODIUM PHOSPHATE 10 MG/ML IJ SOLN: 10.0000 mg | Freq: Once | INTRAMUSCULAR | Status: DC

## 2023-09-28 MED ORDER — ACETAMINOPHEN 650 MG RE SUPP: 650.0000 mg | Freq: Four times a day (QID) | RECTAL | Status: DC | PRN

## 2023-09-28 MED ORDER — ZOLPIDEM TARTRATE 5 MG PO TABS: 5.0000 mg | ORAL_TABLET | Freq: Every evening | ORAL | Status: DC | PRN

## 2023-09-28 MED ORDER — ONDANSETRON HCL 4 MG PO TABS: 4.0000 mg | ORAL_TABLET | Freq: Four times a day (QID) | ORAL | Status: DC | PRN

## 2023-09-28 MED ORDER — LIDOCAINE 5 % EX PTCH
1.0000 | MEDICATED_PATCH | CUTANEOUS | Status: DC
  Administered 2023-09-28: 1 via TRANSDERMAL
  Filled 2023-09-28: qty 1

## 2023-09-28 MED ORDER — POTASSIUM CHLORIDE CRYS ER 20 MEQ PO TBCR
20.0000 meq | EXTENDED_RELEASE_TABLET | Freq: Every day | ORAL | Status: DC
  Administered 2023-09-28 – 2023-09-29 (×2): 20 meq via ORAL
  Filled 2023-09-28 (×2): qty 1

## 2023-09-28 MED ORDER — MONTELUKAST SODIUM 10 MG PO TABS
10.0000 mg | ORAL_TABLET | Freq: Every day | ORAL | Status: DC
  Administered 2023-09-28: 10 mg via ORAL
  Filled 2023-09-28: qty 1

## 2023-09-28 MED ORDER — ENOXAPARIN SODIUM 60 MG/0.6ML IJ SOSY
60.0000 mg | PREFILLED_SYRINGE | Freq: Every day | INTRAMUSCULAR | Status: DC
  Administered 2023-09-28: 60 mg via SUBCUTANEOUS
  Filled 2023-09-28: qty 0.6

## 2023-09-28 MED ORDER — OXYCODONE HCL 5 MG PO TABS
5.0000 mg | ORAL_TABLET | Freq: Three times a day (TID) | ORAL | Status: DC | PRN
  Administered 2023-09-28: 5 mg via ORAL
  Filled 2023-09-28: qty 1

## 2023-09-28 MED ORDER — ONDANSETRON HCL 4 MG/2ML IJ SOLN: 4.0000 mg | Freq: Four times a day (QID) | INTRAMUSCULAR | Status: DC | PRN

## 2023-09-28 MED ORDER — DIETHYLPROPION HCL ER 75 MG PO TB24: 1.0000 | ORAL_TABLET | Freq: Every morning | ORAL | Status: DC

## 2023-09-28 MED ORDER — FLUTICASONE PROPIONATE 50 MCG/ACT NA SUSP
1.0000 | Freq: Every day | NASAL | Status: DC
  Administered 2023-09-29: 1 via NASAL
  Filled 2023-09-28: qty 16

## 2023-09-28 MED ORDER — BACLOFEN 10 MG PO TABS
10.0000 mg | ORAL_TABLET | Freq: Two times a day (BID) | ORAL | Status: DC | PRN
  Administered 2023-09-29: 10 mg via ORAL
  Filled 2023-09-28: qty 1

## 2023-09-28 MED ORDER — LIDOCAINE 5 % EX PTCH: 2.0000 | MEDICATED_PATCH | Freq: Every day | CUTANEOUS | Status: DC

## 2023-09-28 MED ORDER — ATORVASTATIN CALCIUM 10 MG PO TABS
20.0000 mg | ORAL_TABLET | Freq: Every day | ORAL | Status: DC
  Administered 2023-09-28 – 2023-09-29 (×2): 20 mg via ORAL
  Filled 2023-09-28 (×2): qty 2

## 2023-09-28 MED ORDER — LINACLOTIDE 145 MCG PO CAPS
290.0000 ug | ORAL_CAPSULE | Freq: Every day | ORAL | Status: DC
  Administered 2023-09-28 – 2023-09-29 (×2): 290 ug via ORAL
  Filled 2023-09-28 (×3): qty 2

## 2023-09-28 MED ORDER — FUROSEMIDE 40 MG PO TABS
40.0000 mg | ORAL_TABLET | Freq: Every day | ORAL | Status: DC
  Administered 2023-09-29: 40 mg via ORAL
  Filled 2023-09-28: qty 1

## 2023-09-28 MED ORDER — LOSARTAN POTASSIUM-HCTZ 100-25 MG PO TABS: 1.0000 | ORAL_TABLET | Freq: Every day | ORAL | Status: DC

## 2023-09-28 MED ORDER — PANTOPRAZOLE SODIUM 40 MG PO TBEC
40.0000 mg | DELAYED_RELEASE_TABLET | Freq: Every day | ORAL | Status: DC
  Administered 2023-09-28 – 2023-09-29 (×2): 40 mg via ORAL
  Filled 2023-09-28 (×2): qty 1

## 2023-09-28 MED ORDER — ACETAMINOPHEN 325 MG PO TABS
650.0000 mg | ORAL_TABLET | Freq: Four times a day (QID) | ORAL | Status: DC | PRN
  Administered 2023-09-29: 650 mg via ORAL
  Filled 2023-09-28: qty 2

## 2023-09-28 MED ORDER — LEVOTHYROXINE SODIUM 100 MCG PO TABS
100.0000 ug | ORAL_TABLET | Freq: Every day | ORAL | Status: DC
  Administered 2023-09-29: 100 ug via ORAL
  Filled 2023-09-28: qty 1

## 2023-09-28 MED ORDER — ALBUTEROL SULFATE (2.5 MG/3ML) 0.083% IN NEBU: 2.5000 mg | INHALATION_SOLUTION | Freq: Four times a day (QID) | RESPIRATORY_TRACT | Status: DC | PRN

## 2023-09-28 NOTE — ED Provider Notes (Signed)
 [place] [facility] AT [facility] Provider Note   CSN: [id number] Arrival date & time: 09/28/23  4098     History  Chief Complaint  Patient presents with   Generalized Body Aches    Patty Howard is a 68 y.o. female.  HPI   68 year old female with medical history significant for hypertension, COPD/asthma, arthritis, anxiety, GERD presenting to the emergency department with acute on chronic back pain and worsening weakness.  The patient states that she has had back pain for which she was seen by Annell Greening of orthopedics on 08/24/2023.  She underwent MRI imaging to evaluate for progressive L4-L5 stenosis with more right than left leg symptoms.  Her pain is now becoming more severe and she is having trouble walking.  Her x-rays had showed some degenerative grade 1 anterolisthesis with facet arthropathy.  MRI imaging was performed but has not yet been read out.  The patient states that she walks with a cane and endorses bilateral lumbar radiculopathy.  She has had a history of facet injections on 07/14/2023 and lumbar epidural on 06/07/2023 with only a few days of relief.  Pain also radiates down her left hip as well.  She ambulates with a cane and has been taking oxycodone at home for pain management.  Unfortunately, over the last week she has had progressive weakness and is now unable to perform activities of daily living.  She does not feel like she can safely get up the stairs in her home.  She denies any new falls or trauma.  She denies any fevers or chills.  She states that she chronically has issues moving her bowels to stool, denies any new urinary incontinence, no dysuria or frequency.  Additionally, she endorses shortness of breath at baseline for her known asthma.  She also states that she has a history of arthritis and has been having pain and swelling in her right wrist, no erythema or warmth.  She wrapped it with an Ace wrap which helped some.  Home  Medications Prior to Admission medications   Medication Sig Start Date End Date Taking? Authorizing Provider  BREZTRI AEROSPHERE 160-9-4.8 MCG/ACT AERO inhaler Inhale 2 puffs into the lungs 2 (two) times daily. 07/21/23  Yes [provider]  diclofenac Sodium (VOLTAREN) 1 % GEL Apply 4 g topically 4 (four) times daily. 09/23/23  Yes [provider]  oxyCODONE (OXY IR/ROXICODONE) 5 MG immediate release tablet Take 5 mg by mouth 3 (three) times daily as needed. 09/23/23  Yes [provider]  albuterol (PROVENTIL) (2.5 MG/3ML) 0.083% nebulizer solution Take 3 mLs (2.5 mg total) by nebulization every 6 (six) hours as needed for wheezing or shortness of breath. 08/03/22   Alfonse Spruce, MD  albuterol (VENTOLIN HFA) 108 (90 Base) MCG/ACT inhaler Inhale 2 puffs into the lungs every 6 (six) hours as needed for wheezing or shortness of breath. 07/14/23   Olalere, Adewale A, MD  amLODipine (NORVASC) 5 MG tablet Take 1 tablet (5 mg total) by mouth daily. 09/16/23   Ronney Asters, NP  ascorbic acid (VITAMIN C) 500 MG tablet Take 500 mg by mouth daily.    [provider]  atorvastatin (LIPITOR) 20 MG tablet Take 1 tablet (20 mg total) by mouth daily. 09/16/23   Ronney Asters, NP  azithromycin (ZITHROMAX) 500 MG tablet Take 1 tablet (500 mg total) by mouth every Monday, Wednesday, and Friday. 05/19/23   Alfonse Spruce, MD  baclofen (LIORESAL) 10 MG tablet Take 10  mg by mouth 2 (two) times daily as needed. 11/25/22   [provider]  diazepam (VALIUM) 5 MG tablet Take one tablet by mouth with food one hour prior to procedure. May repeat 30 minutes prior if needed. 07/13/23   Williams, Megan E, NP  Diethylpropion HCl CR 75 MG TB24 Take 1 tablet by mouth every morning. 08/13/23   [provider]  fluticasone (FLONASE) 50 MCG/ACT nasal spray Place 1 spray into both nostrils daily. 03/22/23   [provider]  furosemide (LASIX) 40 MG tablet Take 1  tablet (40 mg total) by mouth daily. 09/16/23   Carie Charity, NP  ibuprofen (ADVIL) 800 MG tablet Take 1 tablet (800 mg total) by mouth every 8 (eight) hours as needed for moderate pain. 02/17/22   [place], Alonza Jansky, MD  ipratropium-albuterol (DUONEB) 0.5-2.5 (3) MG/3ML SOLN Take 3 mLs by nebulization every 6 (six) hours as needed. Patient taking differently: Take 3 mLs by nebulization every 6 (six) hours as needed (Wheezing, shortness of breath). 04/23/21   Sean Czar, MD  levothyroxine (SYNTHROID) 100 MCG tablet Take 100 mcg by mouth daily before breakfast.    [provider]  lidocaine (LIDODERM) 5 % Place 1 patch onto the skin daily. Remove & Discard patch within 12 hours or as directed by MD 07/04/23   Schutt, Coni Deep, PA-C  LINZESS 290 MCG CAPS capsule Take 290 mcg by mouth daily. 03/17/22   [provider]  losartan-hydrochlorothiazide (HYZAAR) 100-25 MG tablet Take 1 tablet by mouth daily. 09/16/23   Carie Charity, NP  meloxicam (MOBIC) 15 MG tablet Take 15 mg by mouth as needed for pain. 02/22/23   [provider]  Multiple Vitamin (MULTIVITAMIN WITH MINERALS) TABS tablet Take 1 tablet by mouth daily. Centrum Multivitamin    [provider]  omeprazole (PRILOSEC) 40 MG capsule Take 1 capsule (40 mg total) by mouth daily. STOP PANTOPRAZOLE 02/03/23 03/17/23  Nandigam, Kavitha V, MD  potassium chloride SA (KLOR-CON M) 20 MEQ tablet Take 1 tablet (20 mEq total) by mouth daily. 09/16/23 10/16/23  Carie Charity, NP  predniSONE (STERAPRED UNI-PAK 21 TAB) 10 MG (21) TBPK tablet Take by mouth daily. Take 6 tabs by mouth daily  for 2 days, then 5 tabs for 2 days, then 4 tabs for 2 days, then 3 tabs for 2 days, 2 tabs for 2 days, then 1 tab by mouth daily for 2 days 07/04/23   Schutt, Coni Deep, PA-C  Semaglutide, 1 MG/DOSE, 4 MG/3ML SOPN Inject 1 mg as directed once a week. Patient taking differently: Inject 1 mg into the skin once a week. 02/17/22   [place],  Alonza Jansky, MD  tezepelumab-ekko (TEZSPIRE) 210 MG/1. syringe Inject 1.91 mLs (210 mg total) into the skin every 28 (twenty-eight) days. 07/12/23   [place] Chuck, MD  Vitamin D, Ergocalciferol, (DRISDOL) 1.25 MG (50000 UNIT) CAPS capsule Take 50,000 Units by mouth every Monday.  10/27/19   [provider]  zafirlukast (ACCOLATE) 20 MG tablet TAKE 1 TABLET (20 MG TOTAL) BY MOUTH 2 (TWO) TIMES DAILY BEFORE A MEAL. 12/28/22 06/26/23  Ardie Kras, FNP  zolpidem (AMBIEN CR) 6.25 MG CR tablet Take 1 tablet (6.25 mg total) by mouth at bedtime as needed for sleep. 04/29/22   Margaretann Sharper, MD      Allergies    Patient has no known allergies.    Review of Systems   Review of Systems  All other  systems reviewed and are negative.   Physical Exam Updated Vital Signs BP (!) 128/95   Pulse 71   Temp 97.7 F (36.5 C) (Oral)   Resp 16   Ht 5\' 8"  (1.727 m)   Wt 122.5 kg   SpO2 95%   BMI 41.05 kg/m  Physical Exam Vitals and nursing note reviewed.  Constitutional:      General: She is not in acute distress.    Appearance: She is well-developed.  HENT:     Head: Normocephalic and atraumatic.  Eyes:     Conjunctiva/sclera: Conjunctivae normal.  Cardiovascular:     Rate and Rhythm: Normal rate and regular rhythm.     Heart sounds: No murmur heard. Pulmonary:     Effort: Pulmonary effort is normal. No respiratory distress.     Breath sounds: Normal breath sounds.  Abdominal:     Palpations: Abdomen is soft.     Tenderness: There is no abdominal tenderness.  Musculoskeletal:        General: Tenderness present.     Cervical back: Neck supple.     Comments: Right wrist with mild nonspecific bilateral tenderness, no appreciable swelling, some pain with range of motion, 2+ radial pulses, no clear midline tenderness of the spine, positive straight leg raise test on the right  Skin:    General: Skin is warm and dry.     Capillary Refill: Capillary refill takes less than 2  seconds.  Neurological:     Mental Status: She is alert.     Comments: No cranial nerve deficit.  5-5 strength in the bilateral upper extremities.  4-5 strength in the right lower extremity, 5 out of 5 strength in the left lower extremity, no sensory deficit.  1+ patellar reflexes bilaterally  Psychiatric:        Mood and Affect: Mood normal.     ED Results / Procedures / Treatments   Labs (all labs ordered are listed, but only abnormal results are displayed) Labs Reviewed  CBC WITH DIFFERENTIAL/PLATELET - Abnormal; Notable for the following components:      Result Value   Hemoglobin 11.2 (*)    HCT 35.1 (*)    All other components within normal limits  BASIC METABOLIC PANEL WITH GFR - Abnormal; Notable for the following components:   Glucose, Bld 108 (*)    All other components within normal limits  SEDIMENTATION RATE  C-REACTIVE PROTEIN    EKG None  Radiology DG Wrist Complete Right Result Date: 09/28/2023 CLINICAL DATA:  Right wrist pain for 2 days, no known injury EXAM: RIGHT WRIST - COMPLETE 3+ VIEW COMPARISON:  None Available. FINDINGS: Frontal, oblique, lateral, and ulnar deviated views of the right wrist are obtained on 4 images. No acute displaced fracture, subluxation, or dislocation. Mild osteoarthritis greatest at the first carpometacarpal joint. Areas of chondrocalcinosis are seen at the radiocarpal joint. Soft tissues are unremarkable. IMPRESSION: 1. Mild osteoarthritis.  No acute displaced fracture. Electronically Signed   By: Bobbye Burrow M.D.   On: 09/28/2023 07:54   DG Hip Unilat With Pelvis 2-3 Views Left Result Date: 09/28/2023 CLINICAL DATA:  Left hip pain. EXAM: DG HIP (WITH OR WITHOUT PELVIS) 2-3V LEFT COMPARISON:  Similar study 02/06/2021 FINDINGS: AP pelvis and AP and frog-leg left hip, three views. There is no evidence of hip fracture or dislocation, and no pelvic fracture or diastasis is seen. There is osteopenia. Degenerative spurring both SI joints,  lower lumbar facet joints and pubic symphysis. 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 No focal bone lesion is seen.  There are pelvic phleboliths. Comparison to the prior study reveals no significant interval change. IMPRESSION: 1. No evidence of acute fracture or dislocation. 2. Osteopenia and degenerative change. 3. 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 Electronically Signed   By: Almira Bar M.D.   On: 09/28/2023 04:49    Procedures Procedures    Medications Ordered in ED Medications  ipratropium-albuterol (DUONEB) 0.5-2.5 (3) MG/3ML nebulizer solution 3 mL (3 mLs Nebulization Given 09/28/23 0727)  lidocaine (LIDODERM) 5 % 1 patch (1 patch Transdermal Patch Applied 09/28/23 0747)  oxyCODONE-acetaminophen (PERCOCET/ROXICET) 5-325 MG per tablet 1 tablet (1 tablet Oral Given 09/28/23 0747)  cyclobenzaprine (FLEXERIL) tablet 5 mg (5 mg Oral Given 09/28/23 0747)    ED Course/ Medical Decision Making/ A&P                                 Medical Decision Making Amount and/or Complexity of Data Reviewed Labs: ordered. Radiology: ordered.  Risk Prescription drug management. Decision regarding hospitalization.    68 year old female with medical history significant for hypertension, COPD/asthma, arthritis, anxiety, GERD presenting to the emergency department with acute on chronic back pain and worsening weakness.  The patient states that she has had back pain for which she was seen by Annell Greening of orthopedics on 08/24/2023.  She underwent MRI imaging to evaluate for progressive L4-L5 stenosis with more right than left leg symptoms.  Her pain is now becoming more severe and she is having trouble walking.  Her x-rays had showed some degenerative grade 1 anterolisthesis with facet arthropathy.  MRI imaging was  performed but has not yet been read out.  The patient states that she walks with a cane and endorses bilateral lumbar radiculopathy.  She has had a history of facet injections on 07/14/2023 and lumbar epidural on 06/07/2023 with only a few days of relief.  Pain also radiates down her left hip as well.  She ambulates with a cane and has been taking oxycodone at home for pain management.  Unfortunately, over the last week she has had progressive weakness and is now unable to perform activities of daily living.  She does not feel like she can safely get up the stairs in her home.  She denies any new falls or trauma.  She denies any fevers or chills.  She states that she chronically has issues moving her bowels to stool, denies any new urinary incontinence, no dysuria or frequency.  Additionally, she endorses shortness of breath at baseline for her known asthma.  She also states that she has a history of arthritis and has been having pain and swelling in her right wrist, no erythema or warmth.  She wrapped it with an Ace wrap which helped some.  Medical Decision Making:   Bianney Rockwood is a 68 y.o. female who presented to the ED today with acute on chronic lower back pain with sciatica, as detailed above.    On my initial exam, the pt was with an intact neurologic exam, tolerating ambulation without assistance and p.o. intake without difficulty.  Patient had no abnormal DTRs, bilateral symmetric 1+ DTRs present, no midline spinal tenderness.  Patient endorsing complete sensation of the perineum.  Patient without any new episodes  of fecal or urinary incontinence.  Patient has no focal neurologic deficits and reassuring vital signs at this time.  No obvious physical abnormality or injury on exam. Notably, patient denies recent trauma, is afebrile, and denies IVDU. Pt endorsing some pain in her right wrist, no erythema warmth or significant swelling to suggest evidence of septic arthritis or gout. Suspect  osteoarthritis.  Reviewed and confirmed nursing documentation for past medical history, family history, social history.    Initial Assessment:   With the patient's presentation of subacute to chronic back pain in the above setting, most likely diagnosis is musculoskeletal strain. Other diagnoses were considered including (but not limited to) underlying fracture, epidural hematoma, cauda equina syndrome, spinal stenosis, spinal malignancy. These are considered less likely due to history of present illness and physical exam findings.   In particular, lack of fever, substantial history of IV drug use, or substantial neurologic abnormality is less consistent with epidural abscess versus discitis or other spinal infection.   Initial Plan:  Multimodal pain control described and patient informed on safe usage.  XR pelvis obtain from triage XR right wrist [facility] for read of MRI from 09/06/23 Screening labs  Initial Study Results:   Radiology  DG Wrist Complete Right  Final Result    DG Hip Unilat With Pelvis 2-3 Views Left  Final Result       Pt demonstrated an ability to ambulate at her baseline, feeling mildly improved after the above interventions. Labs revealed ESR normal, BMP unremarkable, normal renal function, no electrolyte abnormality, CBC without a leukocytosis, anemia at baseline at 11.2.  MR Lumbar spine from 09/06/23: Not yet read out  Disposition:   Patient initially ambulated in the emergency department however was having worsening pain and stated that she was continuously having difficulty maintaining her ADLs at home with worsening pain with acute on chronic low back pain.  In this setting, I offered admission for observation and pain control, work with PT and OT for ultimate disposition.  Hospitalist medicine consulted for admission, Dr. Lowell Rude accepting.       Emergency Department Medication Summary:   Medications  ipratropium-albuterol (DUONEB)  0.5-2.5 (3) MG/3ML nebulizer solution 3 mL (3 mLs Nebulization Given 09/28/23 0727)  lidocaine (LIDODERM) 5 % 1 patch (1 patch Transdermal Patch Applied 09/28/23 0747)  oxyCODONE-acetaminophen (PERCOCET/ROXICET) 5-325 MG per tablet 1 tablet (1 tablet Oral Given 09/28/23 0747)  cyclobenzaprine (FLEXERIL) tablet 5 mg (5 mg Oral Given 09/28/23 0747)      Final Clinical Impression(s) / ED Diagnoses Final diagnoses:  Uncomplicated asthma, unspecified asthma severity, unspecified whether persistent  Osteoarthritis of right wrist, unspecified osteoarthritis type  Chronic bilateral low back pain with bilateral sciatica    Rx / DC Orders ED Discharge Orders     None         Rosealee Concha, MD 09/28/23 1440

## 2023-09-28 NOTE — H&P (Signed)
 History and Physical  Patty Howard ZOX:[id number] DOB: 1955/07/17 DOA: 09/28/2023  PCP: Melvenia Beam, MD   Chief Complaint: Back pain  HPI: Patty Howard is a 68 y.o. 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  She is followed by orthopedic surgery, last saw Dr. Ophelia Charter 08/24/2023, and MRI was ordered to evaluate for progressive L4-L5 stenosis.  That MRI has not yet been interpreted.  She came to the ER today for evaluation due to intractable back pain, inability to ambulate and perform her ADLs.  She ambulates with a cane, and has difficulty climbing stairs in her home.  Review of Systems: Please see HPI for pertinent positives and negatives. A complete 10 system review of systems are otherwise negative.  Past Medical History:  Diagnosis Date   Anxiety    Arthritis    Asthma    COPD (chronic obstructive pulmonary disease) (HCC)    Fracture    closed displaced left radial head   GERD (gastroesophageal reflux disease)    Hypertension    Sleep apnea    does not wear CPAP   Past Surgical History:  Procedure Laterality Date   BACK SURGERY     spinal stimulator   BIOPSY  11/10/2022   Procedure: BIOPSY;  Surgeon: Napoleon Form, MD;  Location: [facility];  Service: Gastroenterology;;   BREAST BIOPSY Right 2017   benign   COLONOSCOPY W/ BIOPSIES AND POLYPECTOMY     COLONOSCOPY WITH PROPOFOL N/A 11/10/2022   Procedure: COLONOSCOPY WITH PROPOFOL;  Surgeon: Napoleon Form, MD;  Location: [facility];  Service: Gastroenterology;  Laterality: N/A;   DILATION AND CURETTAGE OF UTERUS     ESOPHAGOGASTRODUODENOSCOPY (EGD) WITH PROPOFOL N/A 11/10/2022   Procedure:  ESOPHAGOGASTRODUODENOSCOPY (EGD) WITH PROPOFOL;  Surgeon: Napoleon Form, MD;  Location: [facility];  Service: Gastroenterology;  Laterality: N/A;   LAPAROSCOPIC ROUX-EN-Y GASTRIC BYPASS WITH UPPER ENDOSCOPY AND REMOVAL OF LAP BAND     POLYPECTOMY  11/10/2022   Procedure: POLYPECTOMY;  Surgeon: Napoleon Form, MD;  Location: [facility];  Service: Gastroenterology;;   RADIAL HEAD ARTHROPLASTY Left 02/03/2019   Procedure: LEFT RADIAL HEAD ARTHROPLASTY;  Surgeon: Eldred Manges, MD;  Location: [facility];  Service: Orthopedics;  Laterality: Left;  AXILLARY BLOCK VS BIER BLOCK   TOTAL HIP ARTHROPLASTY Left 01/22/2021   Procedure: LEFT TOTAL HIP ARTHROPLASTY ANTERIOR APPROACH;  Surgeon: Eldred Manges, MD;  Location: [facility];  Service: Orthopedics;  Laterality: Left;   TUBAL LIGATION     Social History:  reports that she quit smoking about 25 years ago. Her smoking use included cigarettes. She started smoking about 35 years ago. She has a 5 pack-year smoking history. She has been exposed to tobacco smoke. She has never used smokeless tobacco. She reports that she does not drink alcohol and does not use drugs.  No Known Allergies  Family History  Problem Relation Age of Onset   Hypertension Mother    Stroke Mother    Lung cancer Father    Colon cancer Neg Hx    Stomach cancer Neg Hx    Pancreatic cancer Neg Hx      Prior to Admission medications   Medication Sig Start Date End Date Taking? Authorizing Provider  albuterol (  PROVENTIL) (2.5 MG/3ML) 0.083% nebulizer solution Take 3 mLs (2.5 mg total) by nebulization every 6 (six) hours as needed for wheezing or shortness of breath. 08/03/22  Yes Alfonse Spruce, MD  albuterol (VENTOLIN HFA) 108 (90 Base) MCG/ACT inhaler Inhale 2 puffs into the lungs every 6 (six) hours as needed for wheezing or shortness of breath. 07/14/23  Yes Olalere, Adewale A, MD  amLODipine (NORVASC) 5 MG tablet Take 1 tablet (5 mg total) by mouth daily. 09/16/23  Yes  Cleaver, Thomasene Ripple, NP  ascorbic acid (VITAMIN C) 500 MG tablet Take 500 mg by mouth daily.   Yes [provider]  atorvastatin (LIPITOR) 20 MG tablet Take 1 tablet (20 mg total) by mouth daily. 09/16/23  Yes Cleaver, Thomasene Ripple, NP  baclofen (LIORESAL) 10 MG tablet Take 10 mg by mouth 2 (two) times daily as needed. 11/25/22  Yes [provider]  BREZTRI AEROSPHERE 160-9-4.8 MCG/ACT AERO inhaler Inhale 2 puffs into the lungs 2 (two) times daily. 07/21/23  Yes [provider]  diclofenac Sodium (VOLTAREN) 1 % GEL Apply 4 g topically 4 (four) times daily. 09/23/23  Yes [provider]  Diethylpropion HCl CR 75 MG TB24 Take 1 tablet by mouth every morning. 08/13/23  Yes [provider]  fluticasone (FLONASE) 50 MCG/ACT nasal spray Place 1 spray into both nostrils daily. 03/22/23  Yes [provider]  furosemide (LASIX) 40 MG tablet Take 1 tablet (40 mg total) by mouth daily. 09/16/23  Yes Cleaver, Thomasene Ripple, NP  ibuprofen (ADVIL) 800 MG tablet Take 1 tablet (800 mg total) by mouth every 8 (eight) hours as needed for moderate pain. 02/17/22  Yes de Peru, Raymond J, MD  ipratropium-albuterol (DUONEB) 0.5-2.5 (3) MG/3ML SOLN Take 3 mLs by nebulization every 6 (six) hours as needed. Patient taking differently: Take 3 mLs by nebulization every 6 (six) hours as needed (Wheezing, shortness of breath). 04/23/21  Yes Verlee Monte, MD  levothyroxine (SYNTHROID) 100 MCG tablet Take 100 mcg by mouth daily before breakfast.   Yes [provider]  lidocaine (LIDODERM) 5 % Place 1 patch onto the skin daily. Remove & Discard patch within 12 hours or as directed by MD 07/04/23  Yes Schutt, Edsel Petrin, PA-C  LINZESS 290 MCG CAPS capsule Take 290 mcg by mouth daily. 03/17/22  Yes [provider]  losartan-hydrochlorothiazide (HYZAAR) 100-25 MG tablet Take 1 tablet by mouth daily. 09/16/23  Yes Cleaver, Thomasene Ripple, NP  meloxicam (MOBIC) 15 MG tablet Take 15 mg by mouth as  needed for pain. 02/22/23  Yes [provider]  omeprazole (PRILOSEC) 40 MG capsule Take 1 capsule (40 mg total) by mouth daily. STOP PANTOPRAZOLE 02/03/23 09/28/23 Yes Nandigam, Eleonore Chiquito, MD  oxyCODONE (OXY IR/ROXICODONE) 5 MG immediate release tablet Take 5 mg by mouth 3 (three) times daily as needed. 09/23/23  Yes [provider]  potassium chloride SA (KLOR-CON M) 20 MEQ tablet Take 1 tablet (20 mEq total) by mouth daily. 09/16/23 10/16/23 Yes Cleaver, Thomasene Ripple, NP  tezepelumab-ekko (TEZSPIRE) 210 MG/1. syringe Inject 1.91 mLs (210 mg total) into the skin every 28 (twenty-eight) days. 07/12/23  Yes Alfonse Spruce, MD  Vitamin D, Ergocalciferol, (DRISDOL) 1.25 MG (50000 UNIT) CAPS capsule Take 50,000 Units by mouth every Monday.  10/27/19  Yes [provider]  zafirlukast (ACCOLATE) 20 MG tablet TAKE 1 TABLET (20 MG TOTAL) BY MOUTH 2 (TWO) TIMES DAILY BEFORE A MEAL. 12/28/22 09/28/23 Yes Ambs, Norvel Richards, FNP  zolpidem (AMBIEN CR) 6.25 MG CR tablet Take 1 tablet (6.25 mg total) by mouth at bedtime as needed for sleep. 04/29/22  Yes Myer Artis A, MD  azithromycin (ZITHROMAX) 500 MG tablet Take 1 tablet (500 mg total) by mouth every Monday, Wednesday, and Friday. Patient not taking: Reported on 09/28/2023 05/19/23   Rochester Chuck, MD  diazepam (VALIUM) 5 MG tablet Take one tablet by mouth with food one hour prior to procedure. May repeat 30 minutes prior if needed. Patient not taking: Reported on 09/28/2023 07/13/23   Williams, Megan E, NP  predniSONE (STERAPRED UNI-PAK 21 TAB) 10 MG (21) TBPK tablet Take by mouth daily. Take 6 tabs by mouth daily  for 2 days, then 5 tabs for 2 days, then 4 tabs for 2 days, then 3 tabs for 2 days, 2 tabs for 2 days, then 1 tab by mouth daily for 2 days Patient not taking: Reported on 09/28/2023 07/04/23   Sherida Dimmer, PA-C  Semaglutide, 1 MG/DOSE, 4 MG/3ML SOPN Inject 1 mg as directed once a week. Patient not taking: Reported on  09/28/2023 02/17/22   de Peru, Alonza Jansky, MD    Physical Exam: BP (!) 149/87 (BP Location: Right Arm)   Pulse 79   Temp 98.4 F (36.9 C) (Oral)   Resp 16   Ht 5\' 8"  (1.727 m)   Wt 122.5 kg   SpO2 96%   BMI 41.05 kg/m  General:  Alert, oriented, calm, in no acute distress  Eyes: EOMI, clear conjuctivae, white sclerea Neck: supple, no masses, trachea mildline  Cardiovascular: RRR, no murmurs or rubs, no peripheral edema  Respiratory: clear to auscultation bilaterally, no wheezes, no crackles  Abdomen: soft, nontender, nondistended, normal bowel tones heard  Skin: dry, no rashes  Musculoskeletal: no joint effusions, normal range of motion  Psychiatric: appropriate affect, normal speech  Neurologic: She has good strength globally, but slightly diminished in the right lower extremity likely due to pain.  Does not appear to have any sensory deficit.         Labs on Admission:  Basic Metabolic Panel: Recent Labs  Lab 09/28/23 0800  NA 142  K 3.6  CL 106  CO2 28  GLUCOSE 108*  BUN 14  CREATININE 0.55  CALCIUM 9.7   Liver Function Tests: No results for input(s): "AST", "ALT", "ALKPHOS", "BILITOT", "PROT", "ALBUMIN" in the last 168 hours. No results for input(s): "LIPASE", "AMYLASE" in the last 168 hours. No results for input(s): "AMMONIA" in the last 168 hours. CBC: Recent Labs  Lab 09/28/23 0800  WBC 7.4  NEUTROABS 4.0  HGB 11.2*  HCT 35.1*  MCV 84.8  PLT 264   Cardiac Enzymes: No results for input(s): "CKTOTAL", "CKMB", "CKMBINDEX", "TROPONINI" in the last 168 hours. BNP (last 3 results) No results for input(s): "BNP" in the last 8760 hours.  ProBNP (last 3 results) No results for input(s): "PROBNP" in the last 8760 hours.  CBG: No results for input(s): "GLUCAP" in the last 168 hours.  Radiological Exams on Admission: DG Wrist Complete Right Result Date: 09/28/2023 CLINICAL DATA:  Right wrist pain for 2 days, no known injury EXAM: RIGHT WRIST - COMPLETE 3+  VIEW COMPARISON:  None Available. FINDINGS: Frontal, oblique, lateral, and ulnar deviated views of the right wrist are obtained on 4 images. No acute displaced fracture, subluxation, or dislocation. Mild osteoarthritis greatest at the first carpometacarpal joint. Areas of chondrocalcinosis are seen at the radiocarpal joint. Soft tissues are unremarkable. IMPRESSION: 1. Mild  osteoarthritis.  No acute displaced fracture. Electronically Signed   By: Bobbye Burrow M.D.   On: 09/28/2023 07:54   DG Hip Unilat With Pelvis 2-3 Views Left Result Date: 09/28/2023 CLINICAL DATA:  Left hip pain. EXAM: DG HIP (WITH OR WITHOUT PELVIS) 2-3V LEFT COMPARISON:  Similar study 02/06/2021 FINDINGS: AP pelvis and AP and frog-leg left hip, three views. There is no evidence of hip fracture or dislocation, and no pelvic fracture or diastasis is seen. There is osteopenia. Degenerative spurring both SI joints, lower lumbar facet joints and pubic symphysis. There is pelvic enthesopathy which was also seen previously, left hip total joint replacement without evidence of an acute complication or loosening, and mild right hip DJD. No focal bone lesion is seen.  There are pelvic phleboliths. Comparison to the prior study reveals no significant interval change. IMPRESSION: 1. No evidence of acute fracture or dislocation. 2. Osteopenia and degenerative change. 3. 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 Electronically Signed   By: Denman Fischer M.D.   On: 09/28/2023 04:49   Assessment/Plan Chiara Coltrin is a 68 y.o. 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   Lumbar radiculopathy-likely from  known L4-5 stenosis, MRI has been done and we are awaiting interpretation.  I have a very low suspicion for something sinister like abscess, no evidence of cauda equina syndrome. -Observation admission -Pain control -PT consult -Follow-up on lumbar MRI, ER provider spoke with radiology today to request expedited interpretation  Asthma/COPD-continue home Breztri, Singulair, Claritin  Heart failure with preserved EF-continue Lasix  Hypothyroidism-Synthroid  Hyperlipidemia-Lipitor  Hypertension-Cozaar/HCTZ  DVT prophylaxis: Lovenox     Code Status: Full Code  Consults called: None  Admission status: Observation  Time spent: 49 minutes  Jeovany Huitron Rickey Charm MD [facility] Pager [phone]  If 7PM-7AM, please contact night-coverage [email or web address] Password [facility]  09/28/2023, 5:24 PM

## 2023-09-28 NOTE — ED Notes (Signed)
 Thomas with cl called for transport

## 2023-09-28 NOTE — ED Triage Notes (Addendum)
 Patient reports burning on the left side of he hip. States she had a hip replacement 2 years ago. Also reports back pain and swelling in her finger. Patient took oxycodone 5 mg at 2000. No relief.

## 2023-09-28 NOTE — ED Notes (Signed)
 Attempted to give report 1x.

## 2023-09-28 NOTE — ED Notes (Signed)
 ED TO INPATIENT HANDOFF REPORT  ED Nurse Name and Phone #:  Jerilynn Montenegro Name/Age/Gender Bud Care 68 y.o. female Room/Bed: DB011/DB011  Code Status   Code Status: Prior  Home/SNF/Other Home Patient oriented to: self, place, time, and situation Is this baseline? Yes   Triage Complete: Triage complete  Chief Complaint Lumbago [M54.50]  Triage Note Patient reports burning on the left side of he hip. States she had a hip replacement 2 years ago. Also reports back pain and swelling in her finger. Patient took oxycodone 5 mg at 2000. No relief.    Allergies No Known Allergies  Level of Care/Admitting Diagnosis ED Disposition     ED Disposition  Admit   Condition  --   Comment  Hospital Area: [facility] [[id number]]  Level of Care: Med-Surg [16]  Interfacility transfer: Yes  May place patient in observation at [facility] or [facility] if equivalent level of care is available:: Yes  Covid Evaluation: Asymptomatic - no recent exposure (last 10 days) testing not required  Diagnosis: Lumbago [724.2.ICD-9-CM]  Admitting Physician: Gaylin Ke [[id number]]  Attending Physician: Jannette Mend, MIR M [[id number]]          B Medical/Surgery History Past Medical History:  Diagnosis Date   Anxiety    Arthritis    Asthma    COPD (chronic obstructive pulmonary disease) (HCC)    Fracture    closed displaced left radial head   GERD (gastroesophageal reflux disease)    Hypertension    Sleep apnea    does not wear CPAP   Past Surgical History:  Procedure Laterality Date   BACK SURGERY     spinal stimulator   BIOPSY  11/10/2022   Procedure: BIOPSY;  Surgeon: Sergio Dandy, MD;  Location: [facility];  Service: Gastroenterology;;   BREAST BIOPSY Right 2017   benign   COLONOSCOPY W/ BIOPSIES AND POLYPECTOMY     COLONOSCOPY WITH PROPOFOL N/A 11/10/2022   Procedure: COLONOSCOPY WITH PROPOFOL;  Surgeon: Sergio Dandy, MD;  Location: [facility];  Service: Gastroenterology;  Laterality: N/A;   DILATION AND CURETTAGE OF UTERUS     ESOPHAGOGASTRODUODENOSCOPY (EGD) WITH PROPOFOL N/A 11/10/2022   Procedure: ESOPHAGOGASTRODUODENOSCOPY (EGD) WITH PROPOFOL;  Surgeon: Sergio Dandy, MD;  Location: [facility];  Service: Gastroenterology;  Laterality: N/A;   LAPAROSCOPIC ROUX-EN-Y GASTRIC BYPASS WITH UPPER ENDOSCOPY AND REMOVAL OF LAP BAND     POLYPECTOMY  11/10/2022   Procedure: POLYPECTOMY;  Surgeon: Sergio Dandy, MD;  Location: [facility];  Service: Gastroenterology;;   RADIAL HEAD ARTHROPLASTY Left 02/03/2019   Procedure: LEFT RADIAL HEAD ARTHROPLASTY;  Surgeon: Adah Acron, MD;  Location: [facility];  Service: Orthopedics;  Laterality: Left;  AXILLARY BLOCK VS BIER BLOCK   TOTAL HIP ARTHROPLASTY Left 01/22/2021   Procedure: LEFT TOTAL HIP ARTHROPLASTY ANTERIOR APPROACH;  Surgeon: Adah Acron, MD;  Location: [facility];  Service: Orthopedics;  Laterality: Left;   TUBAL LIGATION       A IV Location/Drains/Wounds Patient Lines/Drains/Airways Status     Active Line/Drains/Airways     Name Placement date Placement time Site Days   Peripheral IV 09/28/23 20 G Anterior;Left Forearm 09/28/23  0744  Forearm  less than 1            Intake/Output Last 24 hours No intake or output data in the 24 hours ending 09/28/23 1545  Labs/Imaging Results for orders placed or performed during the hospital encounter of 09/28/23 (from the past  48 hours)  CBC with Differential     Status: Abnormal   Collection Time: 09/28/23  8:00 AM  Result Value Ref Range   WBC 7.4 4.0 - 10.5 K/uL   RBC 4.14 3.87 - 5.11 MIL/uL   Hemoglobin 11.2 (L) 12.0 - 15.0 g/dL   HCT 82.9 (L) 56.2 - 13.0 %   MCV 84.8 80.0 - 100.0 fL   MCH 27.1 26.0 - 34.0 pg   MCHC 31.9 30.0 - 36.0 g/dL   RDW 86.5 78.4 - 69.6 %   Platelets 264 150 - 400 K/uL   nRBC 0.0 0.0 - 0.2 %   Neutrophils Relative % 54 %   Neutro Abs 4.0 1.7 - 7.7 K/uL   Lymphocytes Relative 38  %   Lymphs Abs 2.8 0.7 - 4.0 K/uL   Monocytes Relative 5 %   Monocytes Absolute 0.4 0.1 - 1.0 K/uL   Eosinophils Relative 2 %   Eosinophils Absolute 0.1 0.0 - 0.5 K/uL   Basophils Relative 1 %   Basophils Absolute 0.1 0.0 - 0.1 K/uL   Immature Granulocytes 0 %   Abs Immature Granulocytes 0.01 0.00 - 0.07 K/uL    Comment: Performed at [facility], [address], [place], [place] [address]  Basic metabolic panel     Status: Abnormal   Collection Time: 09/28/23  8:00 AM  Result Value Ref Range   Sodium 142 135 - 145 mmol/L   Potassium 3.6 3.5 - 5.1 mmol/L   Chloride 106 98 - 111 mmol/L   CO2 28 22 - 32 mmol/L   Glucose, Bld 108 (H) 70 - 99 mg/dL    Comment: Glucose reference range applies only to samples taken after fasting for at least 8 hours.   BUN 14 8 - 23 mg/dL   Creatinine, Ser 4.13 0.44 - 1.00 mg/dL   Calcium 9.7 8.9 - 24.4 mg/dL   GFR, Estimated >01 >02 mL/min    Comment: (NOTE) Calculated using the CKD-EPI Creatinine Equation (2021)    Anion gap 8 5 - 15    Comment: Performed at [facility], [address], [place], [place] [address]  Sedimentation rate     Status: None   Collection Time: 09/28/23  8:00 AM  Result Value Ref Range   Sed Rate 13 0 - 22 mm/hr    Comment: Performed at [facility], [address] [place], [place] [address]   DG Wrist Complete Right Result Date: 09/28/2023 CLINICAL DATA:  Right wrist pain for 2 days, no known injury EXAM: RIGHT WRIST - COMPLETE 3+ VIEW COMPARISON:  None Available. FINDINGS: Frontal, oblique, lateral, and ulnar deviated views of the right wrist are obtained on 4 images. No acute displaced fracture, subluxation, or dislocation. Mild osteoarthritis greatest at the first carpometacarpal joint. Areas of chondrocalcinosis are seen at the radiocarpal joint. Soft tissues are unremarkable. IMPRESSION: 1. Mild osteoarthritis.  No acute displaced fracture. Electronically  Signed   By: Bobbye Burrow M.D.   On: 09/28/2023 07:54   DG Hip Unilat With Pelvis 2-3 Views Left Result Date: 09/28/2023 CLINICAL DATA:  Left hip pain. EXAM: DG HIP (WITH OR WITHOUT PELVIS) 2-3V LEFT COMPARISON:  Similar study 02/06/2021 FINDINGS: AP pelvis and AP and frog-leg left hip, three views. There is no evidence of hip fracture or dislocation, and no pelvic fracture or diastasis is seen. There is osteopenia. Degenerative spurring both SI joints, lower lumbar facet joints and pubic symphysis. There is pelvic enthesopathy which was also  seen previously, left hip total joint replacement without evidence of an acute complication or loosening, and mild right hip DJD. No focal bone lesion is seen.  There are pelvic phleboliths. Comparison to the prior study reveals no significant interval change. IMPRESSION: 1. No evidence of acute fracture or dislocation. 2. Osteopenia and degenerative change. 3. 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 Electronically Signed   By: Denman Fischer M.D.   On: 09/28/2023 04:49    Pending Labs Unresulted Labs (From admission, onward)     Start     Ordered   09/28/23 0725  C-reactive protein  Once,   URGENT        09/28/23 0725            Vitals/Pain Today's Vitals   09/28/23 1434 09/28/23 1435 09/28/23 1437 09/28/23 1502  BP:  (!) 141/93    Pulse:  68    Resp:      Temp: 98 F (36.7 C)  98.5 F (36.9 C)   TempSrc:   Oral   SpO2:      Weight:      Height:      PainSc: 8    7     Isolation Precautions No active isolations  Medications Medications  ipratropium-albuterol (DUONEB) 0.5-2.5 (3) MG/3ML nebulizer solution 3 mL (3 mLs Nebulization Given 09/28/23 1355)  lidocaine (LIDODERM) 5 % 1 patch (1 patch Transdermal Patch Applied 09/28/23 0747)  oxyCODONE-acetaminophen (PERCOCET/ROXICET) 5-325 MG per tablet 1 tablet (1 tablet Oral Given 09/28/23 0747)  cyclobenzaprine (FLEXERIL) tablet 5 mg (5 mg  Oral Given 09/28/23 0747)  HYDROmorphone (DILAUDID) injection 1 mg (1 mg Intravenous Given 09/28/23 1434)    Mobility walks with device     Focused Assessments Pulmonary Assessment Handoff:  Lung sounds: Bilateral Breath Sounds: Expiratory wheezes O2 Device: Room Air      R Recommendations: See Admitting Provider Note  Report given to:   Additional Notes:

## 2023-09-28 NOTE — ED Notes (Signed)
 Patient ambulated down the hallway with no assistance. Patient said her legs were sore,but manageable. 90-92 SpO2. 110-120

## 2023-09-29 DIAGNOSIS — M5441 Lumbago with sciatica, right side: Secondary | ICD-10-CM | POA: Diagnosis not present

## 2023-09-29 LAB — BASIC METABOLIC PANEL WITH GFR
Anion gap: 7 (ref 5–15)
BUN: 14 mg/dL (ref 8–23)
CO2: 24 mmol/L (ref 22–32)
Calcium: 9.4 mg/dL (ref 8.9–10.3)
Chloride: 107 mmol/L (ref 98–111)
Creatinine, Ser: 0.62 mg/dL (ref 0.44–1.00)
GFR, Estimated: >60 mL/min (ref >60)
Glucose, Bld: 102 mg/dL — ABNORMAL HIGH (ref 70–99)
Potassium: 4 mmol/L (ref 3.5–5.1)
Sodium: 138 mmol/L (ref 135–145)

## 2023-09-29 LAB — CBC
HCT: 36.8 % (ref 36.0–46.0)
Hemoglobin: 11.4 g/dL — ABNORMAL LOW (ref 12.0–15.0)
MCH: 27.3 pg (ref 26.0–34.0)
MCHC: 31 g/dL (ref 30.0–36.0)
MCV: 88 fL (ref 80.0–100.0)
Platelets: 250 10*3/uL (ref 150–400)
RBC: 4.18 MIL/uL (ref 3.87–5.11)
RDW: 14.6 % (ref 11.5–15.5)
WBC: 6.4 10*3/uL (ref 4.0–10.5)
nRBC: 0 % (ref 0.0–0.2)

## 2023-09-29 MED ORDER — GABAPENTIN 100 MG PO CAPS
200.0000 mg | ORAL_CAPSULE | Freq: Two times a day (BID) | ORAL | Status: DC
Start: 2023-09-29 — End: 2023-09-29

## 2023-09-29 MED ORDER — GABAPENTIN 100 MG PO CAPS
100.0000 mg | ORAL_CAPSULE | Freq: Two times a day (BID) | ORAL | Status: DC
  Administered 2023-09-29: 100 mg via ORAL
  Filled 2023-09-29: qty 1

## 2023-09-29 MED ORDER — ONDANSETRON HCL 4 MG PO TABS: 4.0000 mg | ORAL_TABLET | Freq: Four times a day (QID) | ORAL | 0 refills | Status: AC | PRN

## 2023-09-29 MED ORDER — ACETAMINOPHEN 325 MG PO TABS: 650.0000 mg | ORAL_TABLET | Freq: Four times a day (QID) | ORAL | Status: AC | PRN

## 2023-09-29 MED ORDER — DICLOFENAC 35 MG PO CAPS: 35.0000 mg | ORAL_CAPSULE | Freq: Every day | ORAL | 1 refills | Status: AC | PRN

## 2023-09-29 MED ORDER — GABAPENTIN 100 MG PO CAPS: 100.0000 mg | ORAL_CAPSULE | Freq: Two times a day (BID) | ORAL | 1 refills | Status: DC

## 2023-09-29 NOTE — Plan of Care (Signed)
  Problem: Pain Managment: Goal: General experience of comfort will improve and/or be controlled Outcome: Progressing

## 2023-09-29 NOTE — Progress Notes (Signed)
 Discharge education performed with the patient. Medication education, medication administration, s/s requiring a call to the provider, and follow up appointment times were all reviewed with the patient. She verbalized understanding of all the education. All of her questions and concerns were addressed to her satisfaction. Patient transported to discharge suite to wait for ride.

## 2023-09-29 NOTE — Evaluation (Signed)
 Physical Therapy Evaluation Patient Details Name: Patty Howard MRN: [id number] DOB: 12-12-1955 Today's Date: 09/29/2023  History of Present Illness  68 y.o. 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 She ambulated 200' with RW, no loss of balance. Pt was encouraged to ambulate at least TID in halls to minimize deconditioning during hospitalization. Instructed pt in seated BLE exercises to be done independently. She would benefit from outpatient PT to address back pain. No further acute PT indicated, will sign off.         If plan is discharge home, recommend the following:     Can travel by private vehicle        Equipment Recommendations None recommended by PT  Recommendations for Other Services       Functional Status Assessment Patient has had a recent decline in their functional status and demonstrates the ability to make significant improvements in function in a reasonable and predictable amount of time.     Precautions / Restrictions Precautions Precautions: None Precaution/Restrictions Comments: denies falls in past 6 months Restrictions Weight Bearing Restrictions Per Provider Order: No      Mobility  Bed Mobility Overal bed mobility: Modified Independent             General bed mobility comments: HOB up, used rail    Transfers Overall transfer level: Needs assistance Equipment used: None Transfers: Sit to/from Stand Sit to Stand: Independent                Ambulation/Gait Ambulation/Gait assistance: Modified independent (Device/Increase time) Gait Distance (Feet): 200  Feet Assistive device: Rolling walker (2 wheels) Gait Pattern/deviations: WFL(Within Functional Limits) Gait velocity: WNL     General Gait Details: steady, no loss of balance, 7/10 pain  Stairs            Wheelchair Mobility     Tilt Bed    Modified Rankin (Stroke Patients Only)       Balance Overall balance assessment: Modified Independent                                           Pertinent Vitals/Pain Pain Assessment Pain Assessment: 0-10 Pain Score: 7  Pain Location: low back and R posterior thigh Pain Descriptors / Indicators: Sharp, Tightness Pain Intervention(s): Limited activity within patient's tolerance, Monitored during session, Premedicated before session, Repositioned    Home Living Family/patient expects to be discharged to:: Private residence Living Arrangements: Alone Available Help at Discharge: Family;Available PRN/intermittently Type of Home: House       Alternate Level Stairs-Number of Steps: flight Home Layout: Two level;Able to live on main level with bedroom/bathroom Home Equipment: Rollator (4 wheels)      Prior Function Prior Level of Function : Independent/Modified Independent;Driving             Mobility Comments: walks without AD at baseline, no falls in past 6 months ADLs Comments: independent     Extremity/Trunk Assessment   Upper Extremity Assessment Upper Extremity Assessment: Overall WFL for tasks assessed    Lower Extremity Assessment Lower Extremity Assessment: Overall  WFL for tasks assessed;RLE deficits/detail;LLE deficits/detail RLE Deficits / Details: knee ext and ankle PF/DF 5/5 RLE Sensation: WNL RLE Coordination: WNL LLE Deficits / Details: knee ext and ankle PF/DF 5/5 LLE Sensation: WNL LLE Coordination: WNL    Cervical / Trunk Assessment Cervical / Trunk Assessment: Normal  Communication   Communication Communication: No apparent difficulties    Cognition Arousal:  Alert Behavior During Therapy: WFL for tasks assessed/performed   PT - Cognitive impairments: No apparent impairments                         Following commands: Intact       Cueing       General Comments      Exercises  Long arc quads x 5 both AROM seated Marching x 5 both AROM seated Ankle pumps x 10 both AROM seated   Assessment/Plan    PT Assessment Patient does not need any further PT services  PT Problem List         PT Treatment Interventions      PT Goals (Current goals can be found in the Care Plan section)  Acute Rehab PT Goals Patient Stated Goal: decrease pain PT Goal Formulation: All assessment and education complete, DC therapy    Frequency       Co-evaluation               AM-PAC PT "6 Clicks" Mobility  Outcome Measure Help needed turning from your back to your side while in a flat bed without using bedrails?: None Help needed moving from lying on your back to sitting on the side of a flat bed without using bedrails?: None Help needed moving to and from a bed to a chair (including a wheelchair)?: None Help needed standing up from a chair using your arms (e.g., wheelchair or bedside chair)?: None Help needed to walk in hospital room?: None Help needed climbing 3-5 steps with a railing? : None 6 Click Score: 24    End of Session   Activity Tolerance: Patient tolerated treatment well Patient left: in chair;with call bell/phone within reach Nurse Communication: Mobility status      Time: 6440-3474 PT Time Calculation (min) (ACUTE ONLY): 14 min   Charges:   PT Evaluation $PT Eval Low Complexity: 1 Low   PT General Charges $$ ACUTE PT VISIT: 1 Visit         Daymon Evans PT 09/29/2023  Acute Rehabilitation Services  Office [phone]

## 2023-09-29 NOTE — TOC Initial Note (Addendum)
 Transition of Care [facility]) - Initial/Assessment Note    Patient Details  Name: Patty Howard MRN: [id number] Date of Birth: [birth year]  Transition of [facility]) CM/SW Contact:    Amaryllis Junior, LCSW Phone Number: 09/29/2023, 8:50 AM  Clinical Narrative:                 Pt from home alone. Pt adult children are support systems. Pt continues medical workup. PT eval pending. TOC following for d/c needs.   Update 9:00am- Pt recommended for OPPT, no DME. OPPT order placed.     Barriers to Discharge: Continued Medical Work up   Patient Goals and CMS Choice Patient states their goals for this hospitalization and ongoing recovery are:: return home CMS Medicare.gov Compare Post Acute Care list provided to::  (NA) Choice offered to / list presented to : NA [place] ownership interest in [facility].provided to::  (NA)    Expected Discharge Plan and Services In-house Referral: NA     Living arrangements for the past 2 months: Apartment                 DME Arranged: N/A DME Agency: NA       HH Arranged: NA HH Agency: NA        Prior Living Arrangements/Services Living arrangements for the past 2 months: Apartment Lives with:: Self Patient language and need for interpreter reviewed:: Yes Do you feel safe going back to the place where you live?: Yes      Need for Family Participation in Patient Care: Yes (Comment) Care giver support system in place?: Yes (comment)   Criminal Activity/Legal Involvement Pertinent to Current Situation/Hospitalization: No - Comment as needed  Activities of Daily Living   ADL Screening (condition at time of admission) Independently performs ADLs?: Yes (appropriate for developmental age) Is the patient deaf or have difficulty hearing?: No Does the patient have difficulty seeing, even when wearing glasses/contacts?: No Does the patient have difficulty concentrating, remembering, or making decisions?: No  Permission  Sought/Granted                  Emotional Assessment Appearance:: Appears stated age Attitude/Demeanor/Rapport: Engaged Affect (typically observed): Accepting Orientation: : Oriented to Self, Oriented to Place, Oriented to  Time, Oriented to Situation Alcohol / Substance Use: Not Applicable Psych Involvement: No (comment)  Admission diagnosis:  Lumbago [M54.50] Weakness [R53.1] Osteoarthritis of right wrist, unspecified osteoarthritis type [M19.031] Chronic bilateral low back pain with bilateral sciatica [M54.42, M54.41, G89.29] Uncomplicated asthma, unspecified asthma severity, unspecified whether persistent [J45.909] Patient Active Problem List   Diagnosis Date Noted   Lumbago 09/28/2023   Chronic diastolic HF (heart failure) (HCC) 01/19/2023   Dyslipidemia 01/19/2023   Heme positive stool 11/10/2022   Polyp of descending colon 11/10/2022   Globus sensation 11/10/2022   Severe persistent asthma without complication 10/08/2022   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) 10/08/2022   Perennial allergic rhinitis 10/08/2022   COPD exacerbation (HCC) 04/18/2022   Right hip pain 02/17/2022   Cough 12/05/2021   Type 2 diabetes mellitus without complication, without long-term current use of insulin (HCC) 11/05/2021   Trigger finger, left middle finger 10/02/2021   COPD with acute exacerbation (HCC) 09/28/2021   Acute exacerbation of COPD with asthma (HCC) 09/27/2021   Other intervertebral disc degeneration, lumbar region 07/15/2021   History of total hip arthroplasty, left 03/07/2021   Trochanteric bursitis, left hip 12/23/2020   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   Diastolic dysfunction 11/25/2020   Asthma exacerbation 11/13/2020   Menopause 11/08/2020   Medication management 04/30/2020   Gastroesophageal reflux disease 03/14/2020   OSA (obstructive sleep apnea) 02/15/2020   Abnormal findings on diagnostic  imaging of lung 01/08/2020   Educated about COVID-19 virus infection 11/30/2019   Aortic atherosclerosis (HCC) 11/30/2019   Chest pain 11/26/2019   Acute asthma exacerbation 05/22/2019   Left radial head fracture 02/03/2019   Closed fracture of radial head 01/29/2019   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) (HCC) 10/19/2018   Insomnia 10/19/2018   Elbow pain, chronic, left 10/19/2018   Dyspnea 10/12/2017   Pancolitis (HCC) 05/20/2017   H/O Spinal surgery 01/18/2015   Essential hypertension 01/18/2015   Obesity 01/18/2015   PCP:  Valerie Gather, MD Pharmacy:   [facility] [phone] Jonette Nestle,  - [address] [place] [address] Phone: [phone] Fax: [phone]  [facility] [address] [place] [address] Phone: [phone] Fax: [phone]  [facility] [place] [facility] [address] [place] [address] Phone: [phone] Fax: [phone]  [facility] [place], [place] - [address] [place] [address] Phone: [phone] Fax: [phone]     Social Drivers of Health (SDOH) Social History: SDOH Screenings   Food Insecurity: No Food Insecurity (09/28/2023)  Housing: Low Risk  (09/28/2023)  Transportation Needs: No Transportation Needs (09/28/2023)  Utilities: Not At Risk (09/28/2023)  Depression (PHQ2-9): Low Risk  (02/17/2022)  Social Connections: Unknown (09/28/2023)  Tobacco Use: Medium Risk (09/28/2023)   SDOH Interventions:     Readmission Risk Interventions     No data to display

## 2023-09-29 NOTE — Discharge Summary (Signed)
 Physician Discharge Summary  Patty Howard ZOX:[id number] DOB: January 14, 1956 DOA: 09/28/2023  PCP: Valerie Gather, MD  Admit date: 09/28/2023 Discharge date: 09/29/2023  Admitted From: home Disposition:home Recommendations for Outpatient Follow-up:  Follow up with PCP in 1-2 weeks Please obtain BMP/CBC in one week  Home Health: None Equipment/Devices: None  Discharge Condition stable CODE STATUS: Full code  diet recommendation: Cardiac Brief/Interim Summary:  68 y.o. 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  She is followed by orthopedic surgery, last saw Dr. Murrel Arnt 08/24/2023, and MRI was ordered to evaluate for progressive L4-L5 stenosis.  That MRI has not yet been interpreted.  She came to the ER today for evaluation due to intractable back pain, inability to ambulate and perform her ADLs.  She ambulates with a cane, and has difficulty climbing stairs in her home.  Discharge Diagnoses:  Principal Problem:   Lumbago  Lumbar radiculopathy-likely from known L4-5 stenosis,  MRI 09/06/2023 . Progressive, moderate to severe multifactorial spinal stenosis at L3-4 and mild-to-moderate spinal stenosis at L2-3.Unchanged severe spinal stenosis at L4-5. Unchanged mild-to-moderate neural foraminal stenosis at L4-5 and L5-S1.  She was admitted for pain control and physical therapy evaluation.  She was able to ambulate with PT.  She was discharged on Neurontin 100 mg twice a day with diclofenac as needed.  She will follow-up with her pain management specialist.  She will also follow-up with Ortho spine Dr. Colette Davies. [facility]  Asthma/COPD-continue home  Breztri, Singulair, Claritin   Heart failure with preserved EF-continue Lasix   Hypothyroidism-Synthroid   Hyperlipidemia-Lipitor   Hypertension-Cozaar/HCTZ   Estimated body mass index is 41.05 kg/m as calculated from the following:   Height as of this encounter: 5\' 8"  (1.727 m).   Weight as of this encounter: 122.5 kg.  Discharge Instructions  Discharge Instructions     Ambulatory referral to Physical Therapy   Complete by: As directed    Diet - low sodium heart healthy   Complete by: As directed    Increase activity slowly   Complete by: As directed       Allergies as of 09/29/2023   No Known Allergies      Medication List     STOP taking these medications    azithromycin 500 MG tablet Commonly known as: ZITHROMAX   diazepam 5 MG tablet Commonly known as: VALIUM   ibuprofen 800 MG tablet Commonly known as: ADVIL   meloxicam 15 MG tablet Commonly known as: MOBIC   predniSONE 10 MG (21) Tbpk tablet Commonly known as: STERAPRED UNI-PAK 21 TAB   Semaglutide (1 MG/DOSE) 4 MG/3ML Sopn   zolpidem 6.25 MG CR tablet Commonly known as: Ambien CR       TAKE these medications    acetaminophen 325 MG tablet Commonly known as: TYLENOL Take 2 tablets (650 mg total) by mouth every 6 (six) hours as needed for mild pain (pain score 1-3) (or Fever >/= 101).   albuterol (2.5 MG/3ML) 0.083% nebulizer solution Commonly known as: PROVENTIL Take 3 mLs (2.5 mg total) by nebulization every 6 (six) hours as needed for wheezing or shortness of breath.   albuterol 108 (90 Base) MCG/ACT inhaler Commonly known as: VENTOLIN HFA  Inhale 2 puffs into the lungs every 6 (six) hours as needed for wheezing or shortness of breath.   amLODipine 5 MG tablet Commonly known as: NORVASC Take 1 tablet (5 mg total) by mouth daily.   ascorbic acid 500 MG tablet Commonly known as: VITAMIN C Take 500 mg by mouth daily.   atorvastatin 20 MG tablet Commonly known as: LIPITOR Take 1  tablet (20 mg total) by mouth daily.   baclofen 10 MG tablet Commonly known as: LIORESAL Take 10 mg by mouth 2 (two) times daily as needed.   Breztri Aerosphere 160-9-4.8 MCG/ACT Aero inhaler Generic drug: budeson-glycopyrrolate-formoterol Inhale 2 puffs into the lungs 2 (two) times daily.   Diclofenac 35 MG Caps Take 35 mg by mouth daily as needed.   diclofenac Sodium 1 % Gel Commonly known as: VOLTAREN Apply 4 g topically 4 (four) times daily.   Diethylpropion HCl CR 75 MG Tb24 Take 1 tablet by mouth every morning.   fluticasone 50 MCG/ACT nasal spray Commonly known as: FLONASE Place 1 spray into both nostrils daily.   furosemide 40 MG tablet Commonly known as: LASIX Take 1 tablet (40 mg total) by mouth daily.   gabapentin 100 MG capsule Commonly known as: NEURONTIN Take 1 capsule (100 mg total) by mouth 2 (two) times daily.   ipratropium-albuterol 0.5-2.5 (3) MG/3ML Soln Commonly known as: DUONEB Take 3 mLs by nebulization every 6 (six) hours as needed. What changed: reasons to take this   levothyroxine 100 MCG tablet Commonly known as: SYNTHROID Take 100 mcg by mouth daily before breakfast.   lidocaine 5 % Commonly known as: Lidoderm Place 1 patch onto the skin daily. Remove & Discard patch within 12 hours or as directed by MD   Linzess 290 MCG Caps capsule Generic drug: linaclotide Take 290 mcg by mouth daily.   losartan-hydrochlorothiazide 100-25 MG tablet Commonly known as: HYZAAR Take 1 tablet by mouth daily.   omeprazole 40 MG capsule Commonly known as: PRILOSEC Take 1 capsule (40 mg total) by mouth daily. STOP PANTOPRAZOLE   ondansetron 4 MG tablet Commonly known as: ZOFRAN Take 1 tablet (4 mg total) by mouth every 6 (six) hours as needed for nausea.   oxyCODONE 5 MG immediate release tablet Commonly known as: Oxy IR/ROXICODONE Take 5 mg by mouth 3 (three) times daily as needed.   potassium chloride SA 20 MEQ tablet Commonly known as:  KLOR-CON M Take 1 tablet (20 mEq total) by mouth daily.   Tezspire 210 MG/1. syringe Generic drug: tezepelumab-ekko Inject 1.91 mLs (210 mg total) into the skin every 28 (twenty-eight) days.   Vitamin D (Ergocalciferol) 1.25 MG (50000 UNIT) Caps capsule Commonly known as: DRISDOL Take 50,000 Units by mouth every Monday.   zafirlukast 20 MG tablet Commonly known as: ACCOLATE TAKE 1 TABLET (20 MG TOTAL) BY MOUTH 2 (TWO) TIMES DAILY BEFORE A MEAL.        Follow-up Information     Melvenia Beam, MD. Schedule an appointment as soon as possible for a visit .   Specialty: Family Medicine Contact information: [address]  [address] [place] [address] [phone]         [place], Buren Kos, MD Follow up.   Specialty: Family Medicine Contact information: [address] [place] [address] [phone]         London Sheer, MD Follow up.   Specialty: Orthopedic Surgery Contact information: [address] [place] [address] [phone]  No Known Allergies  Consultations:none   Procedures/Studies: MR Lumbar Spine w/o contrast Result Date: 09/28/2023 CLINICAL DATA:  Low back pain, symptoms persist with > 6 wks treatment. L4-5 stenosis with right buttocks and leg pain > left. EXAM: MRI LUMBAR SPINE WITHOUT CONTRAST TECHNIQUE: Multiplanar, multisequence MR imaging of the lumbar spine was performed. No intravenous contrast was administered. COMPARISON:  Lumbar spine MRI 10/13/2020 FINDINGS: Segmentation:  Standard. Alignment: Unchanged trace anterolisthesis of L4 on L5 and L5 on S1. Vertebrae: No fracture, suspicious marrow lesion, or evidence of discitis. Mild multilevel degenerative endplate changes, including minimal edema along the L4 inferior endplate. Conus medullaris and cauda equina: Conus extends to the L1 level. Conus and cauda equina appear normal. Paraspinal and other soft tissues: Unremarkable. Disc  levels: Disc desiccation throughout the lumbar spine. Mild disc space narrowing at L3-4 and mild-to-moderate narrowing at L4-5 and L5-S1. Diffuse congenital narrowing of the lumbar spinal canal due to short pedicles. T12-L1: Mild disc bulging without stenosis, unchanged. L1-2: Mild disc bulging, epidural lipomatosis, and mild facet and ligamentum flavum hypertrophy result in mild spinal stenosis without neural foraminal stenosis, unchanged L2-3: Left eccentric disc bulging, epidural lipomatosis, and moderate facet and ligamentum flavum hypertrophy result in mild-to-moderate spinal stenosis and mild left neural foraminal stenosis, mildly progressed. L3-4: Disc bulging, mildly prominent epidural fat, and severe right and moderate left facet and ligamentum flavum hypertrophy result in moderate to severe spinal stenosis and mild bilateral neural foraminal stenosis, mildly progressed. L4-5: Anterolisthesis with diffuse bulging of uncovered disc and severe facet and ligamentum flavum hypertrophy result in severe spinal stenosis and mild-to-moderate right and moderate left neural foraminal stenosis, unchanged. L5-S1: Anterolisthesis with mild bulging of uncovered disc and severe facet hypertrophy result in mild left greater than right lateral recess stenosis, borderline to mild spinal stenosis, and mild-to-moderate bilateral neural foraminal stenosis, unchanged. IMPRESSION: 1. Progressive, moderate to severe multifactorial spinal stenosis at L3-4 and mild-to-moderate spinal stenosis at L2-3. 2. Unchanged severe spinal stenosis at L4-5. 3. Unchanged mild-to-moderate neural foraminal stenosis at L4-5 and L5-S1. Electronically Signed   By: Aundra Lee M.D.   On: 09/28/2023 19:29   DG Wrist Complete Right Result Date: 09/28/2023 CLINICAL DATA:  Right wrist pain for 2 days, no known injury EXAM: RIGHT WRIST - COMPLETE 3+ VIEW COMPARISON:  None Available. FINDINGS: Frontal, oblique, lateral, and ulnar deviated views of the  right wrist are obtained on 4 images. No acute displaced fracture, subluxation, or dislocation. Mild osteoarthritis greatest at the first carpometacarpal joint. Areas of chondrocalcinosis are seen at the radiocarpal joint. Soft tissues are unremarkable. IMPRESSION: 1. Mild osteoarthritis.  No acute displaced fracture. Electronically Signed   By: Bobbye Burrow M.D.   On: 09/28/2023 07:54   DG Hip Unilat With Pelvis 2-3 Views Left Result Date: 09/28/2023 CLINICAL DATA:  Left hip pain. EXAM: DG HIP (WITH OR WITHOUT PELVIS) 2-3V LEFT COMPARISON:  Similar study 02/06/2021 FINDINGS: AP pelvis and AP and frog-leg left hip, three views. There is no evidence of hip fracture or dislocation, and no pelvic fracture or diastasis is seen. There is osteopenia. Degenerative spurring both SI joints, lower lumbar facet joints and pubic symphysis. There is pelvic enthesopathy which was also seen previously, left hip total joint replacement without evidence of an acute complication or loosening, and mild right hip DJD. No focal bone lesion is seen.  There are pelvic phleboliths. Comparison to the prior study reveals no significant interval change. IMPRESSION: 1. No evidence of acute fracture or dislocation. 2.  Osteopenia and degenerative change. 3. 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 Electronically Signed   By: Denman Fischer M.D.   On: 09/28/2023 04:49   (Echo, Carotid, EGD, Colonoscopy, ERCP)    Subjective:  Sitting up by the side of the bed complains of intractable pain with movement I went over the MRI report that was done last month followed by Dr. Murrel Arnt till he retired. Discharge Exam: Vitals:   09/29/23 0057 09/29/23 0809  BP: 130/75   Pulse: 85   Resp: 20   Temp: 97.8 F (36.6 C)   SpO2: 100% 96%   Vitals:   09/28/23 2353 09/28/23 2359 09/29/23 0057 09/29/23 0809  BP:   130/75   Pulse: 85  85   Resp: 18  20   Temp:   97.8 F (36.6 C)   TempSrc:       SpO2: 96% 96% 100% 96%  Weight:      Height:        General: Pt is alert, awake, not in acute distress Cardiovascular: RRR, S1/S2 +, no rubs, no gallops Respiratory: CTA bilaterally, no wheezing, no rhonchi Abdominal: Soft, NT, ND, bowel sounds + Extremities: no edema, no cyanosis    The results of significant diagnostics from this hospitalization (including imaging, microbiology, ancillary and laboratory) are listed below for reference.     Microbiology: No results found for this or any previous visit (from the past 240 hours).   Labs: BNP (last 3 results) No results for input(s): "BNP" in the last 8760 hours. Basic Metabolic Panel: Recent Labs  Lab 09/28/23 0800 09/29/23 0515  NA 142 138  K 3.6 4.0  CL 106 107  CO2 28 24  GLUCOSE 108* 102*  BUN 14 14  CREATININE 0.55 0.62  CALCIUM 9.7 9.4   Liver Function Tests: No results for input(s): "AST", "ALT", "ALKPHOS", "BILITOT", "PROT", "ALBUMIN" in the last 168 hours. No results for input(s): "LIPASE", "AMYLASE" in the last 168 hours. No results for input(s): "AMMONIA" in the last 168 hours. CBC: Recent Labs  Lab 09/28/23 0800 09/29/23 0515  WBC 7.4 6.4  NEUTROABS 4.0  --   HGB 11.2* 11.4*  HCT 35.1* 36.8  MCV 84.8 88.0  PLT 264 250   Cardiac Enzymes: No results for input(s): "CKTOTAL", "CKMB", "CKMBINDEX", "TROPONINI" in the last 168 hours. BNP: Invalid input(s): "POCBNP" CBG: No results for input(s): "GLUCAP" in the last 168 hours. D-Dimer No results for input(s): "DDIMER" in the last 72 hours. Hgb A1c No results for input(s): "HGBA1C" in the last 72 hours. Lipid Profile No results for input(s): "CHOL", "HDL", "LDLCALC", "TRIG", "CHOLHDL", "LDLDIRECT" in the last 72 hours. Thyroid function studies No results for input(s): "TSH", "T4TOTAL", "T3FREE", "THYROIDAB" in the last 72 hours.  Invalid input(s): "FREET3" Anemia work up No results for input(s): "VITAMINB12", "FOLATE", "FERRITIN", "TIBC",  "IRON", "RETICCTPCT" in the last 72 hours. Urinalysis    Component Value Date/Time   COLORURINE YELLOW 01/16/2021 1055   APPEARANCEUR CLOUDY (A) 01/16/2021 1055   LABSPEC 1.024 01/16/2021 1055   PHURINE 5.0 01/16/2021 1055   GLUCOSEU NEGATIVE 01/16/2021 1055   HGBUR SMALL (A) 01/16/2021 1055   BILIRUBINUR NEGATIVE 01/16/2021 1055   KETONESUR NEGATIVE 01/16/2021 1055   PROTEINUR 30 (A) 01/16/2021 1055   NITRITE NEGATIVE 01/16/2021 1055   LEUKOCYTESUR NEGATIVE 01/16/2021 1055   Sepsis Labs Recent Labs  Lab 09/28/23 0800 09/29/23 0515  WBC 7.4 6.4   Microbiology No results found for this  or any previous visit (from the past 240 hours).   Time coordinating discharge: 37 minutes  SIGNED:   Barbee Lew, MD  [facility] 09/29/2023, 6:03 PM

## 2023-09-30 ENCOUNTER — Ambulatory Visit (INDEPENDENT_AMBULATORY_CARE_PROVIDER_SITE_OTHER): Admitting: Orthopedic Surgery

## 2023-09-30 ENCOUNTER — Other Ambulatory Visit (INDEPENDENT_AMBULATORY_CARE_PROVIDER_SITE_OTHER): Payer: Self-pay

## 2023-09-30 VITALS — BP 116/74 | HR 105 | Ht 68.0 in | Wt 270.0 lb

## 2023-09-30 DIAGNOSIS — G8929 Other chronic pain: Secondary | ICD-10-CM

## 2023-09-30 DIAGNOSIS — M545 Low back pain, unspecified: Secondary | ICD-10-CM

## 2023-09-30 MED ORDER — CYCLOBENZAPRINE HCL 10 MG PO TABS: 10.0000 mg | ORAL_TABLET | Freq: Three times a day (TID) | ORAL | 0 refills | Status: DC | PRN

## 2023-09-30 NOTE — Progress Notes (Signed)
 Orthopedic Spine Surgery Office Note  Assessment: Patient is a 68 y.o. female with several years of back pain that has gotten progressively worse.  Also noting radiating pain down the posterior aspect of her buttock and leg.  Has stenosis at L3/4 and L4/5 with a spondylolisthesis at L4/5   Plan: -Explained that initially conservative treatment is tried as a significant number of patients may experience relief with these treatment modalities. Discussed that the conservative treatments include:  -activity modification  -physical therapy  -over the counter pain medications  -medrol dosepak  -steroid injections -Patient has tried: Tylenol, ibuprofen, oral steroids, facet injections, lumbar ESI, oxycodone -She is not getting relief with facet injections or lumbar ESI so would not repeat those in the future -Recommended tylenol, flexeril, gabapentin, ibuprofen for flares. Prescribed flexeril today Also recommended PT, referral provided to her today -Discussed weight loss with her.  I think this would help with her pain and if surgery is ever considered, I would want her to get down to a BMI of 35 -Patient should return to office in 8 weeks, x-rays at next visit: none   Patient expressed understanding of the plan and all questions were answered to the patient's satisfaction.   ___________________________________________________________________________   History:  Patient is a 68 y.o. female who presents today for lumbar spine.  Patient has had several years, approximately 5, of low back pain.  She feels in the lower lumbar region.  It has gotten progressively worse with time.  In the last year she has noticed pain rating down the posterior aspect of her buttock and leg.  She feels it bilaterally.  She says that the right side is more symptomatic than the left.  She notices the pain with activity and at rest.  There is no trauma or injury that preceded the onset of her pain.  She is limited in the  activities that she can do because of the pain.  She said she cannot walk long distances because of the pain.   Weakness: Denies Symptoms of imbalance: Denies Paresthesias and numbness: Denies Bowel or bladder incontinence: Denies Saddle anesthesia: Denies  Treatments tried: Tylenol, ibuprofen, oral steroids, facet injections, lumbar ESI, oxycodone  Review of systems: Denies fevers and chills, night sweats, unexplained weight loss, history of cancer.  Has had pain that wakes her at night  Past medical history: Anxiety COPD HTN OSA GERD  Allergies: NKDA  Past surgical history:  Gastric bypass Left read head arthroplasty Tubal ligation Left THA  Social history: Denies use of nicotine product (smoking, vaping, patches, smokeless) Alcohol use: denies Denies recreational drug use   Physical Exam:  BMI of 41.1  General: no acute distress, appears stated age Neurologic: alert, answering questions appropriately, following commands Respiratory: unlabored breathing on room air, symmetric chest rise Psychiatric: appropriate affect, normal cadence to speech   MSK (spine):  -Strength exam      Left  Right EHL    5/5  5/5 TA    5/5  5/5 GSC    5/5  5/5 Knee extension  5/5  5/5 Hip flexion   5/5  5/5  -Sensory exam    Sensation intact to light touch in L3-S1 nerve distributions of bilateral lower extremities  -Achilles DTR: 1/4 on the left, 1/4 on the right -Patellar tendon DTR: 1/4 on the left, 1/4 on the right  -Straight leg raise: Negative bilaterally -Femoral nerve stretch test: Negative bilaterally -Clonus: no beats bilaterally  -Left hip exam: No pain through range  of motion, negative Stinchfield, negative FABER -Right hip exam: No pain through range of motion, negative Stinchfield, negative FABER  Imaging: XRs of the lumbar spine from 09/30/2023 were independently reviewed and interpreted, showing grade 1 spondylolisthesis at L4/5.  Disc height loss at  L4/5 and L5/S1.  No fracture or dislocation seen.  MRI of the lumbar spine from 09/06/2023 was independently reviewed and interpreted, showing spondylolisthesis at L4/5.  Central, lateral recess, and bilateral foraminal stenosis at L4/5.  Central stenosis at L3/4. Significant facet arthropathy at L4/5 and L5/S1.  DEXA scan from 02/25/2024 showed a T score ofd -1.2  Patient name: Patty Howard Patient MRN: [id number] Date of visit: 09/30/23

## 2023-10-12 ENCOUNTER — Ambulatory Visit

## 2023-10-13 ENCOUNTER — Ambulatory Visit: Payer: Medicare HMO | Admitting: Pulmonary Disease

## 2023-10-13 ENCOUNTER — Ambulatory Visit (INDEPENDENT_AMBULATORY_CARE_PROVIDER_SITE_OTHER)

## 2023-10-13 DIAGNOSIS — J4550 Severe persistent asthma, uncomplicated: Secondary | ICD-10-CM | POA: Diagnosis not present

## 2023-10-13 MED ORDER — TEZEPELUMAB-EKKO 210 MG/1.91ML ~~LOC~~ SOSY
210.0000 mg | PREFILLED_SYRINGE | SUBCUTANEOUS | Status: AC
  Administered 2023-10-13 – 2024-06-14 (×9): 210 mg via SUBCUTANEOUS

## 2023-10-18 ENCOUNTER — Telehealth: Payer: Self-pay | Admitting: Orthopedic Surgery

## 2023-10-18 MED ORDER — GABAPENTIN 300 MG PO CAPS
300.0000 mg | ORAL_CAPSULE | Freq: Two times a day (BID) | ORAL | 2 refills | Status: AC
Start: 2023-10-18 — End: 2024-01-31

## 2023-10-18 NOTE — Telephone Encounter (Signed)
 Patient came in and said she needs a refill on Gabepentin 300 mg twice a day. CB#[phone]

## 2023-10-18 NOTE — Addendum Note (Signed)
 Addended by: Colette Davies on: 10/18/2023 01:15 PM   Modules accepted: Orders

## 2023-10-28 ENCOUNTER — Other Ambulatory Visit: Payer: Self-pay

## 2023-10-28 ENCOUNTER — Emergency Department (HOSPITAL_BASED_OUTPATIENT_CLINIC_OR_DEPARTMENT_OTHER)
Admission: EM | Admit: 2023-10-28 | Discharge: 2023-10-28 | Disposition: A | Discharge status: Home / Self Care | Attending: Emergency Medicine | Admitting: Emergency Medicine

## 2023-10-28 ENCOUNTER — Encounter (HOSPITAL_BASED_OUTPATIENT_CLINIC_OR_DEPARTMENT_OTHER): Payer: Self-pay | Admitting: Emergency Medicine

## 2023-10-28 DIAGNOSIS — I10 Essential (primary) hypertension: Secondary | ICD-10-CM | POA: Insufficient documentation

## 2023-10-28 DIAGNOSIS — G8929 Other chronic pain: Secondary | ICD-10-CM | POA: Diagnosis not present

## 2023-10-28 DIAGNOSIS — Z79899 Other long term (current) drug therapy: Secondary | ICD-10-CM | POA: Insufficient documentation

## 2023-10-28 DIAGNOSIS — M546 Pain in thoracic spine: Secondary | ICD-10-CM | POA: Diagnosis present

## 2023-10-28 DIAGNOSIS — M545 Low back pain, unspecified: Secondary | ICD-10-CM | POA: Diagnosis not present

## 2023-10-28 DIAGNOSIS — J4489 Other specified chronic obstructive pulmonary disease: Secondary | ICD-10-CM | POA: Insufficient documentation

## 2023-10-28 LAB — CBC WITH DIFFERENTIAL/PLATELET
Abs Immature Granulocytes: 0.04 10*3/uL (ref 0.00–0.07)
Basophils Absolute: 0 10*3/uL (ref 0.0–0.1)
Basophils Relative: 0 %
Eosinophils Absolute: 0.1 10*3/uL (ref 0.0–0.5)
Eosinophils Relative: 1 %
HCT: 35.9 % — ABNORMAL LOW (ref 36.0–46.0)
Hemoglobin: 11.3 g/dL — ABNORMAL LOW (ref 12.0–15.0)
Immature Granulocytes: 0 %
Lymphocytes Relative: 33 %
Lymphs Abs: 3.4 10*3/uL (ref 0.7–4.0)
MCH: 26.6 pg (ref 26.0–34.0)
MCHC: 31.5 g/dL (ref 30.0–36.0)
MCV: 84.5 fL (ref 80.0–100.0)
Monocytes Absolute: 0.5 10*3/uL (ref 0.1–1.0)
Monocytes Relative: 5 %
Neutro Abs: 6.4 10*3/uL (ref 1.7–7.7)
Neutrophils Relative %: 61 %
Platelets: 279 10*3/uL (ref 150–400)
RBC: 4.25 MIL/uL (ref 3.87–5.11)
RDW: 15.2 % (ref 11.5–15.5)
WBC: 10.6 10*3/uL — ABNORMAL HIGH (ref 4.0–10.5)
nRBC: 0 % (ref 0.0–0.2)

## 2023-10-28 LAB — COMPREHENSIVE METABOLIC PANEL WITH GFR
ALT: 16 U/L (ref 0–44)
AST: 18 U/L (ref 15–41)
Albumin: 3.8 g/dL (ref 3.5–5.0)
Alkaline Phosphatase: 114 U/L (ref 38–126)
Anion gap: 12 (ref 5–15)
BUN: 15 mg/dL (ref 8–23)
CO2: 25 mmol/L (ref 22–32)
Calcium: 9.7 mg/dL (ref 8.9–10.3)
Chloride: 108 mmol/L (ref 98–111)
Creatinine, Ser: 0.65 mg/dL (ref 0.44–1.00)
GFR, Estimated: >60 mL/min (ref >60)
Glucose, Bld: 98 mg/dL (ref 70–99)
Potassium: 3.8 mmol/L (ref 3.5–5.1)
Sodium: 144 mmol/L (ref 135–145)
Total Bilirubin: 0.2 mg/dL (ref 0.0–1.2)
Total Protein: 6.3 g/dL — ABNORMAL LOW (ref 6.5–8.1)

## 2023-10-28 MED ORDER — DIAZEPAM 5 MG PO TABS
5.0000 mg | ORAL_TABLET | Freq: Once | ORAL | Status: AC
  Administered 2023-10-28: 5 mg via ORAL
  Filled 2023-10-28: qty 1

## 2023-10-28 MED ORDER — CYCLOBENZAPRINE HCL 5 MG PO TABS
5.0000 mg | ORAL_TABLET | Freq: Once | ORAL | Status: AC
  Administered 2023-10-28: 5 mg via ORAL
  Filled 2023-10-28: qty 1

## 2023-10-28 MED ORDER — HYDROMORPHONE HCL 1 MG/ML IJ SOLN
1.0000 mg | Freq: Once | INTRAMUSCULAR | Status: AC
  Administered 2023-10-28: 1 mg via INTRAVENOUS
  Filled 2023-10-28: qty 1

## 2023-10-28 MED ORDER — ONDANSETRON 4 MG PO TBDP
4.0000 mg | ORAL_TABLET | Freq: Once | ORAL | Status: DC
  Filled 2023-10-28: qty 1

## 2023-10-28 MED ORDER — METHYLPREDNISOLONE 4 MG PO TBPK: ORAL_TABLET | ORAL | 0 refills | Status: AC

## 2023-10-28 MED ORDER — METHOCARBAMOL 500 MG PO TABS: 500.0000 mg | ORAL_TABLET | Freq: Three times a day (TID) | ORAL | 0 refills | Status: DC | PRN

## 2023-10-28 MED ORDER — DEXAMETHASONE SODIUM PHOSPHATE 10 MG/ML IJ SOLN
10.0000 mg | Freq: Once | INTRAMUSCULAR | Status: AC
  Administered 2023-10-28: 10 mg via INTRAVENOUS
  Filled 2023-10-28: qty 1

## 2023-10-28 MED ORDER — KETOROLAC TROMETHAMINE 30 MG/ML IJ SOLN
15.0000 mg | Freq: Once | INTRAMUSCULAR | Status: AC
  Administered 2023-10-28: 15 mg via INTRAVENOUS
  Filled 2023-10-28: qty 1

## 2023-10-28 NOTE — ED Provider Notes (Signed)
  EMERGENCY DEPARTMENT AT [facility] Provider Note   CSN: [id number] Arrival date & time: 10/28/23  4098     History  Chief Complaint  Patient presents with   Back Pain    Patty Howard is a 68 y.o. female.   68 year old female with medical history significant for hypertension, COPD/asthma, arthritis, anxiety, GERD presenting to the emergency department with acute on chronic back pain, admitted to the hospital last month for pain control here with worsening back pain for the past 3 days.  She denies any fall or injury.  States she has had chronic back pain for many months and follows with Dr. Ayesha Lente of orthopedics who is since retired and osseous Dr. Sulema Endo.  She was told she might need surgery but needs to lose weight first.  At home she is taking only gabapentin  for pain and states other things have not worked. She was admitted a month ago at [facility] for pain control and worked with physical therapy.  She stayed for 1 night and went home the next day.  Reports that she has been on multiple medications for pain in the past including acetaminophen , ibuprofen , Flexeril , oxycodone  and nothing has helped.  She is now on gabapentin  only. She was able to drive herself to the ED.  She reports she is having worsening pain over the past 3 days that is in the middle of her back and radiates down to both of her hips.  This is similar to her previous chronic back pain.  No new weakness, numbness or tingling.  No bowel or bladder incontinence.  No fever or vomiting.  No history of IV drug abuse or cancer.  No previous back surgery.  She did have an MRI in March that showed severe spinal stenosis. Denies any change to her chronic pain pattern.  No abdominal pain or chest pain.  No shortness of breath.  The history is provided by the patient.  Back Pain Associated symptoms: no abdominal pain, no chest pain, no dysuria, no fever, no headaches and no weakness        Home  Medications Prior to Admission medications   Medication Sig Start Date End Date Taking? Authorizing Provider  acetaminophen  (TYLENOL ) 325 MG tablet Take 2 tablets (650 mg total) by mouth every 6 (six) hours as needed for mild pain (pain score 1-3) (or Fever >/= 101). 09/29/23   Barbee Lew, MD  albuterol  (PROVENTIL ) (2.5 MG/3ML) 0.083% nebulizer solution Take 3 mLs (2.5 mg total) by nebulization every 6 (six) hours as needed for wheezing or shortness of breath. 08/03/22   Rochester Chuck, MD  albuterol  (VENTOLIN  HFA) 108 [id number] Base) MCG/ACT inhaler Inhale 2 puffs into the lungs every 6 (six) hours as needed for wheezing or shortness of breath. 07/14/23   Olalere, Adewale A, MD  amLODipine  (NORVASC ) 5 MG tablet Take 1 tablet (5 mg total) by mouth daily. 09/16/23   Carie Charity, NP  ascorbic acid  (VITAMIN C) 500 MG tablet Take 500 mg by mouth daily.    [provider]  atorvastatin  (LIPITOR) 20 MG tablet Take 1 tablet (20 mg total) by mouth daily. 09/16/23   Carie Charity, NP  BREZTRI  AEROSPHERE 160-9-4.8 MCG/ACT AERO inhaler Inhale 2 puffs into the lungs 2 (two) times daily. 07/21/23   [provider]  cyclobenzaprine  (FLEXERIL ) 10 MG tablet Take 1 tablet (10 mg total) by mouth 3 (three) times daily as needed for muscle spasms. 09/30/23   Colette Davies  A, MD  Diclofenac  35 MG CAPS Take 35 mg by mouth daily as needed. 09/29/23   Barbee Lew, MD  diclofenac  Sodium (VOLTAREN) 1 % GEL Apply 4 g topically 4 (four) times daily. 09/23/23   [provider]  Diethylpropion  HCl CR 75 MG TB24 Take 1 tablet by mouth every morning. 08/13/23   [provider]  fluticasone  (FLONASE ) 50 MCG/ACT nasal spray Place 1 spray into both nostrils daily. 03/22/23   [provider]  furosemide  (LASIX ) 40 MG tablet Take 1 tablet (40 mg total) by mouth daily. 09/16/23   Carie Charity, NP  gabapentin  (NEURONTIN ) 300 MG capsule Take 1 capsule (300 mg total) by mouth 2  (two) times daily. 10/18/23 01/16/24  Diedra Fowler, MD  ipratropium-albuterol  (DUONEB) 0.5-2.5 (3) MG/3ML SOLN Take 3 mLs by nebulization every 6 (six) hours as needed. Patient taking differently: Take 3 mLs by nebulization every 6 (six) hours as needed (Wheezing, shortness of breath). 04/23/21   Sean Czar, MD  levothyroxine  (SYNTHROID ) 100 MCG tablet Take 100 mcg by mouth daily before breakfast.    [provider]  lidocaine  (LIDODERM ) 5 % Place 1 patch onto the skin daily. Remove & Discard patch within 12 hours or as directed by MD 07/04/23   Schutt, Coni Deep, PA-C  LINZESS  290 MCG CAPS capsule Take 290 mcg by mouth daily. 03/17/22   [provider]  losartan -hydrochlorothiazide  (HYZAAR) 100-25 MG tablet Take 1 tablet by mouth daily. 09/16/23   Carie Charity, NP  omeprazole  (PRILOSEC) 40 MG capsule Take 1 capsule (40 mg total) by mouth daily. STOP PANTOPRAZOLE  02/03/23 09/28/23  Nandigam, Kavitha V, MD  ondansetron  (ZOFRAN ) 4 MG tablet Take 1 tablet (4 mg total) by mouth every 6 (six) hours as needed for nausea. 09/29/23   Barbee Lew, MD  oxyCODONE  (OXY IR/ROXICODONE ) 5 MG immediate release tablet Take 5 mg by mouth 3 (three) times daily as needed. 09/23/23   [provider]  potassium chloride  SA (KLOR-CON  M) 20 MEQ tablet Take 1 tablet (20 mEq total) by mouth daily. 09/16/23 10/16/23  Carie Charity, NP  tezepelumab -ekko (TEZSPIRE ) 210 MG/1. syringe Inject 1.91 mLs (210 mg total) into the skin every 28 (twenty-eight) days. 07/12/23   Rochester Chuck, MD  Vitamin D , Ergocalciferol , (DRISDOL ) 1.25 MG (50000 UNIT) CAPS capsule Take 50,000 Units by mouth every Monday.  10/27/19   [provider]  zafirlukast  (ACCOLATE ) 20 MG tablet TAKE 1 TABLET (20 MG TOTAL) BY MOUTH 2 (TWO) TIMES DAILY BEFORE A MEAL. 12/28/22 09/28/23  Ardie Kras, FNP      Allergies    Patient has no known allergies.    Review of Systems   Review of Systems  Constitutional:   Negative for activity change, appetite change and fever.  HENT:  Negative for congestion and rhinorrhea.   Respiratory:  Negative for cough, chest tightness and shortness of breath.   Cardiovascular:  Negative for chest pain.  Gastrointestinal:  Negative for abdominal pain, nausea and vomiting.  Genitourinary:  Negative for dysuria and hematuria.  Musculoskeletal:  Positive for back pain.  Skin:  Negative for rash.  Neurological:  Negative for dizziness, weakness and headaches.   all other systems are negative except as noted in the HPI and PMH.    Physical Exam Updated Vital Signs BP (!) 155/87   Pulse (!) 108   Temp 98.8 F (37.1 C) (Oral)   Resp (!) 22   Ht 5\' 8"  (  1.727 m)   Wt 122.5 kg   SpO2 98%   BMI 41.05 kg/m  Physical Exam Vitals and nursing note reviewed.  Constitutional:      General: She is not in acute distress.    Appearance: She is well-developed. She is obese.     Comments: Uncomfortable, tearful  HENT:     Head: Normocephalic and atraumatic.     Mouth/Throat:     Pharynx: No oropharyngeal exudate.  Eyes:     Conjunctiva/sclera: Conjunctivae normal.     Pupils: Pupils are equal, round, and reactive to light.  Neck:     Comments: No meningismus. Cardiovascular:     Rate and Rhythm: Normal rate and regular rhythm.     Heart sounds: Normal heart sounds. No murmur heard. Pulmonary:     Effort: Pulmonary effort is normal. No respiratory distress.     Breath sounds: Normal breath sounds.  Abdominal:     Palpations: Abdomen is soft.     Tenderness: There is no abdominal tenderness. There is no guarding or rebound.  Musculoskeletal:        General: Tenderness present. Normal range of motion.     Cervical back: Normal range of motion and neck supple.     Comments: Midline lumbar spine pain no step-offs  5/5 strength in bilateral lower extremities. Ankle plantar and dorsiflexion intact. Great toe extension intact bilaterally. +2 DP and PT pulses.  Antalgic  gait +1 patellar reflexes bilaterally  Skin:    General: Skin is warm.  Neurological:     Mental Status: She is alert and oriented to person, place, and time.     Cranial Nerves: No cranial nerve deficit.     Motor: No abnormal muscle tone.     Coordination: Coordination normal.     Comments:  5/5 strength throughout. CN 2-12 intact.Equal grip strength.   Psychiatric:        Behavior: Behavior normal.     ED Results / Procedures / Treatments   Labs (all labs ordered are listed, but only abnormal results are displayed) Labs Reviewed  CBC WITH DIFFERENTIAL/PLATELET - Abnormal; Notable for the following components:      Result Value   WBC 10.6 (*)    Hemoglobin 11.3 (*)    HCT 35.9 (*)    All other components within normal limits  COMPREHENSIVE METABOLIC PANEL WITH GFR - Abnormal; Notable for the following components:   Total Protein 6.3 (*)    All other components within normal limits    EKG None  Radiology No results found.   MRI 09/06/23 IMPRESSION: 1. Progressive, moderate to severe multifactorial spinal stenosis at L3-4 and mild-to-moderate spinal stenosis at L2-3. 2. Unchanged severe spinal stenosis at L4-5. 3. Unchanged mild-to-moderate neural foraminal stenosis at L4-5 and L5-S1.   Procedures Procedures    Medications Ordered in ED Medications - No data to display  ED Course/ Medical Decision Making/ A&P                                 Medical Decision Making Amount and/or Complexity of Data Reviewed Labs: ordered. Decision-making details documented in ED Course. Radiology: ordered and independent interpretation performed. Decision-making details documented in ED Course. ECG/medicine tests: ordered and independent interpretation performed. Decision-making details documented in ED Course.  Risk Prescription drug management.   Acute on chronic back pain.  No new trauma.  No new weakness, numbness or tingling.  No bowel or bladder incontinence.  No  fever. No history of IV drug abuse or cancer.   Low suspicion for cord compression or cauda equina.  Does have known severe spinal stenosis.  Low concern for kidney stone, aortic aneurysm, aortic dissection, cord compression or cauda equina.  Low concern for discitis or osteomyelitis.  No recent trauma. Do not feel x-ray or CT scan would be beneficial.  Recent MRI reviewed.  Pain has improved in the ED with treatment as she is able to ambulate with her cane.  She is feels comfortable going home and does not want to be admitted to the hospital.   Will give course of muscle relaxers and steroids.  Her orthopedist gave her Flexeril .  Will change this to Robaxin .  Discussed she should take one or the other but not both.  Will follow-up with her orthopedic doctor and pain clinic.  Return to the ED if worsening pain, weakness, numbness, tingling, bowel or bladder incontinence or any other concerns.       Final Clinical Impression(s) / ED Diagnoses Final diagnoses:  Chronic midline low back pain without sciatica    Rx / DC Orders ED Discharge Orders     None         Tyshaun Vinzant, Mara Seminole, MD 10/28/23 [phone]

## 2023-10-28 NOTE — Discharge Instructions (Signed)
 Take the steroids and muscle relaxers as prescribed.  Take Robaxin  or Flexeril  but not both. Follow-up with your primary doctor as well as your orthopedic specialist.  Return to the ED for worsening pain, weakness, numbness, tingling, bowel or bladder incontinence or any other concerns.

## 2023-10-28 NOTE — ED Notes (Signed)
 ED Provider at bedside.

## 2023-10-28 NOTE — ED Triage Notes (Addendum)
 Patient complaining of recurrent lower back pain ongoing for 3 days but worsening tonight. Denies recent injury. Hx of spinal stenosis. Seen last month for similar symptoms. Pain uncontrollable at home despite prescribed medications. Last gabapentin  taken at 1800 5/14.

## 2023-10-28 NOTE — ED Notes (Signed)
 Patient able to ambulate independently using cane. Reports pain has improved after pain meds. MD at bedside

## 2023-10-29 ENCOUNTER — Ambulatory Visit: Admitting: Physical Therapy

## 2023-10-29 DIAGNOSIS — R2689 Other abnormalities of gait and mobility: Secondary | ICD-10-CM | POA: Diagnosis not present

## 2023-10-29 DIAGNOSIS — M5459 Other low back pain: Secondary | ICD-10-CM

## 2023-10-29 DIAGNOSIS — M6281 Muscle weakness (generalized): Secondary | ICD-10-CM

## 2023-10-29 NOTE — Therapy (Signed)
 OUTPATIENT PHYSICAL THERAPY THORACOLUMBAR EVALUATION   Patient Name: Kahlaya Banken MRN: [id number] DOB:09-May-1956, 68 y.o., female Today's Date: 10/29/2023  END OF SESSION:  PT End of Session - 10/29/23 1009     Visit Number 1    Number of Visits 16    Date for PT Re-Evaluation 12/24/23    Authorization Type [employer]    Progress Note Due on Visit 10    PT Start Time 1015    PT Stop Time 1055    PT Time Calculation (min) 40 min    Activity Tolerance Patient tolerated treatment well             Past Medical History:  Diagnosis Date   Anxiety    Arthritis    Asthma    COPD (chronic obstructive pulmonary disease) (HCC)    Fracture    closed displaced left radial head   GERD (gastroesophageal reflux disease)    Hypertension    Sleep apnea    does not wear CPAP   Past Surgical History:  Procedure Laterality Date   BACK SURGERY     spinal stimulator   BIOPSY  11/10/2022   Procedure: BIOPSY;  Surgeon: Sergio Dandy, MD;  Location: [facility];  Service: Gastroenterology;;   BREAST BIOPSY Right 2017   benign   COLONOSCOPY W/ BIOPSIES AND POLYPECTOMY     COLONOSCOPY WITH PROPOFOL  N/A 11/10/2022   Procedure: COLONOSCOPY WITH PROPOFOL ;  Surgeon: Sergio Dandy, MD;  Location: [facility];  Service: Gastroenterology;  Laterality: N/A;   DILATION AND CURETTAGE OF UTERUS     ESOPHAGOGASTRODUODENOSCOPY (EGD) WITH PROPOFOL  N/A 11/10/2022   Procedure: ESOPHAGOGASTRODUODENOSCOPY (EGD) WITH PROPOFOL ;  Surgeon: Sergio Dandy, MD;  Location: [facility];  Service: Gastroenterology;  Laterality: N/A;   LAPAROSCOPIC ROUX-EN-Y GASTRIC BYPASS WITH UPPER ENDOSCOPY AND REMOVAL OF LAP BAND     POLYPECTOMY  11/10/2022   Procedure: POLYPECTOMY;  Surgeon: Sergio Dandy, MD;  Location: [facility];  Service: Gastroenterology;;   RADIAL HEAD ARTHROPLASTY Left 02/03/2019   Procedure: LEFT RADIAL HEAD ARTHROPLASTY;  Surgeon: Adah Acron, MD;  Location: [facility];   Service: Orthopedics;  Laterality: Left;  AXILLARY BLOCK VS BIER BLOCK   TOTAL HIP ARTHROPLASTY Left 01/22/2021   Procedure: LEFT TOTAL HIP ARTHROPLASTY ANTERIOR APPROACH;  Surgeon: Adah Acron, MD;  Location: [facility] OR;  Service: Orthopedics;  Laterality: Left;   TUBAL LIGATION     Patient Active Problem List   Diagnosis Date Noted   Lumbago 09/28/2023   Chronic diastolic HF (heart failure) (HCC) 01/19/2023   Dyslipidemia 01/19/2023   Heme positive stool 11/10/2022   Polyp of descending colon 11/10/2022   Globus sensation 11/10/2022   Severe persistent asthma without complication 10/08/2022   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) 10/08/2022   Perennial allergic rhinitis 10/08/2022   COPD exacerbation (HCC) 04/18/2022   Right hip pain 02/17/2022   Cough 12/05/2021   Type 2 diabetes mellitus without complication, without long-term current use of insulin  (HCC) 11/05/2021   Trigger finger, left middle finger 10/02/2021   COPD with acute exacerbation (HCC) 09/28/2021   Acute exacerbation of COPD with asthma (HCC) 09/27/2021   Other intervertebral disc degeneration, lumbar region 07/15/2021   History of total hip arthroplasty, left 03/07/2021   Trochanteric bursitis, left hip 12/23/2020   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   Diastolic dysfunction 11/25/2020   Asthma exacerbation 11/13/2020   Menopause 11/08/2020   Medication management 04/30/2020  Gastroesophageal reflux disease 03/14/2020   OSA (obstructive sleep apnea) 02/15/2020   Abnormal findings on diagnostic imaging of lung 01/08/2020   Educated about COVID-19 virus infection 11/30/2019   Aortic atherosclerosis (HCC) 11/30/2019   Chest pain 11/26/2019   Acute asthma exacerbation 05/22/2019   Left radial head fracture 02/03/2019   Closed fracture of radial head 01/29/2019   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) (HCC) 10/19/2018    Insomnia 10/19/2018   Elbow pain, chronic, left 10/19/2018   Dyspnea 10/12/2017   Pancolitis (HCC) 05/20/2017   H/O Spinal surgery 01/18/2015   Essential hypertension 01/18/2015   Obesity 01/18/2015    PCP: Valerie Gather, MD  REFERRING PROVIDER: Diedra Fowler, MD  REFERRING DIAG:  M54.50,G89.29 (ICD-10-CM) - Chronic right-sided low back pain, unspecified whether sciatica present    Rationale for Evaluation and Treatment: Rehabilitation  THERAPY DIAG:  Muscle weakness (generalized)  Other low back pain  Other abnormalities of gait and mobility  ONSET DATE: at least 5 years  SUBJECTIVE:                                                                                                                                                                                           SUBJECTIVE STATEMENT: Pt reports severe back pain for the past couple of years. Had to go to the ER last night and was given pain medications. Reports she has been suffering from spinal stenosis. Painful to bend and walk. Pt states it's been getting worse. Pt states she had a spinal stimulator but didn't work so she took it out.   PERTINENT HISTORY:  medical history significant for hypertension, COPD/asthma, arthritis, anxiety, GERD, L THA  PAIN:  Are you having pain? Yes: NPRS scale: 7 currently, at worst 9 Pain location: lower lumbar to sacrum Pain description: sharp, radiates down back of legs Aggravating factors: bending, walking (tolerates ~10 min of walking), stairs Relieving factors: Constant pain  PRECAUTIONS: None  RED FLAGS: None   WEIGHT BEARING RESTRICTIONS: No  FALLS:  Has patient fallen in last 6 months? No  LIVING ENVIRONMENT: Lives with: lives with their family grandson Lives in: House/apartment Stairs: 6 steps up/down on R Has following equipment at home: None  OCCUPATION: Retired  PLOF: Independent  PATIENT GOALS: Improve overall function  NEXT MD VISIT:  n/a  OBJECTIVE:  Note: Objective measures were completed at Evaluation unless otherwise noted.  DIAGNOSTIC FINDINGS:  MRI 09/06/23 IMPRESSION: 1. Progressive, moderate to severe multifactorial spinal stenosis at L3-4 and mild-to-moderate spinal stenosis at L2-3. 2. Unchanged severe spinal stenosis at L4-5. 3. Unchanged mild-to-moderate neural foraminal stenosis at L4-5 and  L5-S1.  XRs of the lumbar spine from 09/30/2023 were independently reviewed and  interpreted, showing grade 1 spondylolisthesis at L4/5.  Disc height loss  at L4/5 and L5/S1.  No fracture or dislocation seen.   PATIENT SURVEYS:  The Patient-Specific Functional Scale  Initial:  I am going to ask you to identify up to 3 important activities that you are unable to do or are having difficulty with as a result of this problem.  Today are there any activities that you are unable to do or having difficulty with because of this?  (Patient shown scale and patient rated each activity)  Follow up: When you first came in you had difficulty performing these activities.  Today do you still have difficulty?  Patient-Specific activity scoring scheme (Point to one number):  0 1 2 3 4 5 6 7 8 9  10 Unable                                                                                                          Able to perform To perform                                                                                                    activity at the same Activity         Level as before                                                                                                                       Injury or problem Activity Initial (eval): Follow up:  Walking 8   2.   Climbing up stairs  8   3.   Stretching 6   AVERAGE 22/3 = 7.3      COGNITION: Overall cognitive status: Within functional limits for tasks assessed     SENSATION: WFL  MUSCLE LENGTH: Hamstrings: Right >80 deg; Left >90 deg Andy Bannister test: shortened  bilat  POSTURE: Increased lumbar lordosis and ant pelvic tilt  PALPATION: Pt reports no overt tenderness to palpation of muscles but points to pain in lower lumbar/upper sacrum  LUMBAR ROM: TBA  AROM eval  Flexion  Extension   Right lateral flexion   Left lateral flexion   Right rotation 100% but pain  Left rotation 100% but pain   (Blank rows = not tested)   LOWER EXTREMITY MMT:    MMT Right eval Left eval  Hip flexion 4 3+  Hip extension 4 4-  Hip abduction 3+ 3  Hip adduction    Hip internal rotation    Hip external rotation    Knee flexion 4 3+  Knee extension 5 5  Ankle dorsiflexion    Ankle plantarflexion    Ankle inversion    Ankle eversion     (Blank rows = not tested)  LUMBAR SPECIAL TESTS:  Straight leg raise test: Negative, FABER test: pulls into mid back bilat, and Thomas test: shortened hip flexors, pulls into back  FUNCTIONAL TESTS:  Did not assess  GAIT: Distance walked: Into clinic Assistive device utilized: None Level of assistance: Complete Independence Comments: Increased lumbar ext and ant pelvic tilt  TREATMENT DATE:  10/29/23 Self care: log roll for transferring in/out of bed, initial HEP, POC, aquatics, role of exercises and PT   PATIENT EDUCATION:  Education details: see above Person educated: Patient Education method: Programmer, multimedia, Demonstration, and Handouts Education comprehension: verbalized understanding, returned demonstration, and needs further education  HOME EXERCISE PROGRAM: Access Code: [email or web address] URL: [email or web address] Date: 10/29/2023 Prepared by: Kalif Kattner April Erman [place]  Exercises - Clamshell  - 1 x daily - 7 x weekly - 2 sets - 10 reps - Supine Posterior Pelvic Tilt  - 1 x daily - 7 x weekly - 2 sets - 10 reps - Supine Beginner Bridge  - 1 x daily - 7 x weekly - 2 sets - 10 reps  ASSESSMENT:  CLINICAL IMPRESSION: Patient is a 68 y.o. F who was seen today for physical therapy evaluation  and treatment for low back pain. PMH significant for chronic back pain and L THA. Imaging demos varying degrees of spinal stenosis throughout her lumbar spine. Assessment was significant for gross bilat hip weakness (L worse than R), decreased knowledge of safe body mechanics/back protective measures, core weakness and increased lumbar lordosis.   OBJECTIVE IMPAIRMENTS: Abnormal gait, decreased activity tolerance, decreased balance, decreased endurance, decreased knowledge of condition, decreased mobility, difficulty walking, decreased ROM, decreased strength, hypomobility, increased fascial restrictions, impaired flexibility, improper body mechanics, postural dysfunction, obesity, and pain.   ACTIVITY LIMITATIONS: lifting, bending, sitting, standing, squatting, stairs, transfers, bathing, toileting, dressing, and locomotion level  PARTICIPATION LIMITATIONS: meal prep, cleaning, laundry, shopping, and community activity  PERSONAL FACTORS: Age, Fitness, Past/current experiences, and Time since onset of injury/illness/exacerbation are also affecting patient's functional outcome.   REHAB POTENTIAL: Good  CLINICAL DECISION MAKING: Evolving/moderate complexity  EVALUATION COMPLEXITY: Moderate   GOALS: Goals reviewed with patient? Yes  SHORT TERM GOALS: Target date: 11/26/2023   Pt will be ind with initial HEP Baseline: Goal status: INITIAL  2.  Pt will verbalize and demonstrate understanding of good body mechanics to protect her back Baseline:  Goal status: INITIAL    LONG TERM GOALS: Target date: 12/24/2023   Pt will be ind with management and progression of land and aquatic HEP to transition to community wellness Baseline:  Goal status: INITIAL  2.  Pt will be able to demo safe bending and lifting of at least 10# for home tasks Baseline:  Goal status: INITIAL  3.  Pt will be able to walk at least 20 min with tolerable pain Baseline:  Goal status: INITIAL  4.  Pt will have  improved PSFS average score by at least 2 more points to demo MCID Baseline:  Goal status: INITIAL    PLAN:  PT FREQUENCY: 2x/week  PT DURATION: 8 weeks  PLANNED INTERVENTIONS: 97164- PT Re-evaluation, 97750- Physical Performance Testing, 97110-Therapeutic exercises, 97530- Therapeutic activity, [id number]- Neuromuscular re-education, 97535- Self Care, 74259- Manual therapy, [id number]- Gait training, [phone]- Electrical stimulation (unattended), 97016- Vasopneumatic device, [id number]- Ultrasound, [id number]- Traction (mechanical), [id number]- Ionotophoresis 4mg /ml Dexamethasone , Patient/Family education, Balance training, Stair training, Taping, Dry Needling, Joint mobilization, Spinal mobilization, Cryotherapy, and Moist heat.  PLAN FOR NEXT SESSION: Assess response to HEP. Work on core and hip strength/stability. Work on Estate manager/land agent (print out education), give info on aquatics.    Brynda Heick April Ma L Briggitte Boline, PT, DPT 10/29/2023, 12:57 PM

## 2023-11-09 ENCOUNTER — Encounter: Admitting: Physical Therapy

## 2023-11-09 ENCOUNTER — Telehealth: Payer: Self-pay | Admitting: Physical Therapy

## 2023-11-09 NOTE — Telephone Encounter (Signed)
 I called pt to follow up after she missed her 11:00 PT appointment on 11/09/23. I left a message offering pt a 9:30 am slot for tomorrow and I left our clinic number [phone] for pt to call if she chose to take that appointment.  Jerrel Mor, PT, MPT 11/09/23 11:31 AM

## 2023-11-10 ENCOUNTER — Ambulatory Visit (INDEPENDENT_AMBULATORY_CARE_PROVIDER_SITE_OTHER)

## 2023-11-10 DIAGNOSIS — J4550 Severe persistent asthma, uncomplicated: Secondary | ICD-10-CM | POA: Diagnosis not present

## 2023-11-15 ENCOUNTER — Other Ambulatory Visit (INDEPENDENT_AMBULATORY_CARE_PROVIDER_SITE_OTHER)

## 2023-11-15 ENCOUNTER — Encounter: Payer: Self-pay | Admitting: Physician Assistant

## 2023-11-15 ENCOUNTER — Ambulatory Visit: Admitting: Physician Assistant

## 2023-11-15 DIAGNOSIS — M25561 Pain in right knee: Secondary | ICD-10-CM

## 2023-11-15 MED ORDER — METHYLPREDNISOLONE ACETATE 40 MG/ML IJ SUSP
40.0000 mg | INTRAMUSCULAR | Status: AC | PRN
Start: 2023-11-15 — End: 2023-11-15
  Administered 2023-11-15: 40 mg via INTRA_ARTICULAR

## 2023-11-15 MED ORDER — LIDOCAINE HCL 1 % IJ SOLN
3.0000 mL | INTRAMUSCULAR | Status: AC | PRN
Start: 2023-11-15 — End: 2023-11-15
  Administered 2023-11-15: 3 mL

## 2023-11-15 NOTE — Progress Notes (Signed)
 Office Visit Note   Patient: Patty Howard           Date of Birth: 1955-11-28           MRN: [id number] Visit Date: 11/15/2023              Requested by: Valerie Gather, MD [address]  [address] [place],  [place] [address] PCP: Valerie Gather, MD   Assessment & Plan: Visit Diagnoses:  1. Acute pain of right knee     Plan: Will have her work on quad strengthening exercises shown.  Will see her back in 2 weeks to see what type of response she had to the aspiration injection right knee.  Knee was wrapped with an Ace bandage today which she will remove prior to returning to bed.  Questions were encouraged and answered at length.  Follow-Up Instructions: Return in about 2 weeks (around 11/29/2023).   Orders:  Orders Placed This Encounter  Procedures   Large Joint Inj   XR Knee 1-2 Views Right   No orders of the defined types were placed in this encounter.     Procedures: Large Joint Inj: R knee on 11/15/2023 2:20 PM Indications: pain Details: 22 G 1.5 in needle, anterolateral approach  Arthrogram: No  Medications: 3 mL lidocaine  1 %; 40 mg methylPREDNISolone  acetate 40 MG/ML Aspirate: 17 mL yellow Outcome: tolerated well, no immediate complications Procedure, treatment alternatives, risks and benefits explained, specific risks discussed. Consent was given by the patient. Immediately prior to procedure a time out was called to verify the correct patient, procedure, equipment, support staff and site/side marked as required. Patient was prepped and draped in the usual sterile fashion.       Clinical Data: No additional findings.   Subjective: Chief Complaint  Patient presents with   Right Knee - Pain    HPI Patient is a 68 year old female who is being seen here in our office most recently by Dr. Ayesha Lente and Dr. Sulema Endo.  Comes in today with right knee pain for the last 3 days.  No injury.  Pain is mostly medial and anterior aspect of the knee.   She describes the pain as constant.  No mechanical symptoms.  She states the knee pain is getting worse.  She also feels that she may have fluid on the knee.  She is "prediabetic".  Notes she has had to use a cane due to the knee pain on the right.  Review of Systems  Constitutional:  Negative for chills and fever.     Objective: Vital Signs: There were no vitals taken for this visit.  Physical Exam Constitutional:      Appearance: She is not ill-appearing or diaphoretic.  Pulmonary:     Effort: Pulmonary effort is normal.  Neurological:     Mental Status: She is alert and oriented to person, place, and time.  Psychiatric:        Behavior: Behavior normal.     Ortho Exam Bilateral knees: No abnormal warmth or erythema.  Slight effusion right knee only.  Tenderness along medial joint line right knee only.  No gross instability valgus varus stressing.  Patellofemoral crepitus both knees with passive range of motion.  Full range of motion both knees.  Able to perform leg raise bilaterally. Imaging: XR Knee 1-2 Views Right Result Date: 11/15/2023 Right knee 2 views: Is well located.  Moderate patellofemoral arthritic changes.  Mild to moderate narrowing medial joint line.  No acute  fractures.  No bony lesions.    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) 10/08/2022   Perennial allergic rhinitis 10/08/2022   COPD exacerbation (HCC) 04/18/2022   Right hip pain 02/17/2022   Cough 12/05/2021   Type 2 diabetes mellitus without complication, without long-term current use of insulin  (HCC)  11/05/2021   Trigger finger, left middle finger 10/02/2021   COPD with acute exacerbation (HCC) 09/28/2021   Acute exacerbation of COPD with asthma (HCC) 09/27/2021   Other intervertebral disc degeneration, lumbar region 07/15/2021   History of total hip arthroplasty, left 03/07/2021   Trochanteric bursitis, left hip 12/23/2020   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   Diastolic dysfunction 11/25/2020   Asthma exacerbation 11/13/2020   Menopause 11/08/2020   Medication management 04/30/2020   Gastroesophageal reflux disease 03/14/2020   OSA (obstructive sleep apnea) 02/15/2020   Abnormal findings on diagnostic imaging of lung 01/08/2020   Educated about COVID-19 virus infection 11/30/2019   Aortic atherosclerosis (HCC) 11/30/2019   Chest pain 11/26/2019   Acute asthma exacerbation 05/22/2019   Left radial head fracture 02/03/2019   Closed fracture of radial head 01/29/2019   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) (HCC) 10/19/2018   Insomnia 10/19/2018   Elbow pain, chronic, left 10/19/2018   Dyspnea 10/12/2017   Pancolitis (HCC) 05/20/2017   H/O Spinal surgery 01/18/2015   Essential hypertension 01/18/2015   Obesity 01/18/2015   Past Medical History:  Diagnosis Date   Anxiety    Arthritis    Asthma    COPD (chronic obstructive pulmonary disease) (HCC)    Fracture    closed displaced left radial head   GERD (gastroesophageal reflux disease)    Hypertension    Sleep apnea    does not wear CPAP    Family History  Problem Relation Age of Onset   Hypertension Mother    Stroke Mother    Lung cancer Father    Colon cancer Neg Hx    Stomach cancer Neg Hx    Pancreatic cancer Neg Hx     Past Surgical History:  Procedure Laterality Date   BACK SURGERY     spinal stimulator   BIOPSY  11/10/2022   Procedure: BIOPSY;  Surgeon: Sergio Dandy, MD;  Location: [facility];  Service: Gastroenterology;;   BREAST BIOPSY Right 2017   benign   COLONOSCOPY W/  BIOPSIES AND POLYPECTOMY     COLONOSCOPY WITH PROPOFOL  N/A 11/10/2022   Procedure: COLONOSCOPY WITH PROPOFOL ;  Surgeon: Sergio Dandy, MD;  Location: [facility];  Service: Gastroenterology;  Laterality: N/A;   DILATION AND CURETTAGE OF UTERUS     ESOPHAGOGASTRODUODENOSCOPY (EGD) WITH PROPOFOL  N/A 11/10/2022   Procedure: ESOPHAGOGASTRODUODENOSCOPY (EGD) WITH PROPOFOL ;  Surgeon: Sergio Dandy, MD;  Location: [facility];  Service: Gastroenterology;  Laterality: N/A;   LAPAROSCOPIC ROUX-EN-Y GASTRIC BYPASS WITH UPPER ENDOSCOPY AND REMOVAL OF LAP BAND     POLYPECTOMY  11/10/2022   Procedure: POLYPECTOMY;  Surgeon: Sergio Dandy, MD;  Location: [facility];  Service: Gastroenterology;;   RADIAL HEAD ARTHROPLASTY Left 02/03/2019   Procedure: LEFT RADIAL HEAD ARTHROPLASTY;  Surgeon: Adah Acron, MD;  Location: [facility];  Service: Orthopedics;  Laterality: Left;  AXILLARY BLOCK VS BIER BLOCK   TOTAL HIP ARTHROPLASTY Left 01/22/2021   Procedure: LEFT TOTAL HIP ARTHROPLASTY ANTERIOR APPROACH;  Surgeon: Adah Acron, MD;  Location: [facility];  Service: Orthopedics;  Laterality: Left;   TUBAL LIGATION     Social History   Occupational History   Not on file  Tobacco Use   Smoking status: Former    Current packs/day: 0.00    Average packs/day: 0.5 packs/day for 10.0 years (5.0 ttl pk-yrs)    Types: Cigarettes    Start date: 06/15/1988    Quit date: 06/15/1998    Years since quitting: 25.4    Passive exposure: Past   Smokeless tobacco: Never  Vaping Use   Vaping status: Never Used  Substance and Sexual Activity   Alcohol use: Never   Drug use: Never   Sexual activity: Yes

## 2023-11-18 ENCOUNTER — Telehealth: Payer: Self-pay | Admitting: Allergy & Immunology

## 2023-11-18 MED ORDER — IPRATROPIUM-ALBUTEROL 0.5-2.5 (3) MG/3ML IN SOLN: RESPIRATORY_TRACT | 0 refills | Status: DC

## 2023-11-18 NOTE — Telephone Encounter (Signed)
 She found the information and stated it is [facility] [phone].

## 2023-11-18 NOTE — Telephone Encounter (Signed)
 Called patient - DOB/NEED DPR/INS - patient advised 4 mth f/u appt needed for medication refills.  Last seen: 05/18/23 return in 4 months  Scheduled appt.: 12/06/23 @ 10:50 am w/ Rexene Catching.  Courtesy refill of Ipratropium - Albuterol  (DUONEB) 0.5-2.5  3 mg/3 mL neb sol  - 1 vial via nebulizer every 6 hours as need for cough, wheeze, shortness of breath, chest tightness - electronically sent to [facility].  Patient verbalized understanding to all, no further questions.

## 2023-11-18 NOTE — Telephone Encounter (Signed)
 Patty Howard called and stated that she is in need of a refill of her Albuterol  Proventil  (or pre auth for refill) not sure exactly as our call was cut off. She states that she has been using a pharmacy that will mail it you, possibly [facility] was not sure). But she would be okay with it going to the [facility] on [address] if need be. Best contact [phone]

## 2023-11-22 ENCOUNTER — Telehealth: Payer: Self-pay | Admitting: Rehabilitative and Restorative Service Providers"

## 2023-11-22 ENCOUNTER — Encounter: Admitting: Rehabilitative and Restorative Service Providers"

## 2023-11-22 NOTE — Therapy (Incomplete)
 OUTPATIENT PHYSICAL THERAPY TREATMENT   Patient Name: Patty Howard MRN: [id number] DOB:12/19/1955, 68 y.o., female Today's Date: 11/22/2023  END OF SESSION:    Past Medical History:  Diagnosis Date   Anxiety    Arthritis    Asthma    COPD (chronic obstructive pulmonary disease) (HCC)    Fracture    closed displaced left radial head   GERD (gastroesophageal reflux disease)    Hypertension    Sleep apnea    does not wear CPAP   Past Surgical History:  Procedure Laterality Date   BACK SURGERY     spinal stimulator   BIOPSY  11/10/2022   Procedure: BIOPSY;  Surgeon: Sergio Dandy, MD;  Location: [facility];  Service: Gastroenterology;;   BREAST BIOPSY Right 2017   benign   COLONOSCOPY W/ BIOPSIES AND POLYPECTOMY     COLONOSCOPY WITH PROPOFOL  N/A 11/10/2022   Procedure: COLONOSCOPY WITH PROPOFOL ;  Surgeon: Sergio Dandy, MD;  Location: [facility];  Service: Gastroenterology;  Laterality: N/A;   DILATION AND CURETTAGE OF UTERUS     ESOPHAGOGASTRODUODENOSCOPY (EGD) WITH PROPOFOL  N/A 11/10/2022   Procedure: ESOPHAGOGASTRODUODENOSCOPY (EGD) WITH PROPOFOL ;  Surgeon: Sergio Dandy, MD;  Location: [facility];  Service: Gastroenterology;  Laterality: N/A;   LAPAROSCOPIC ROUX-EN-Y GASTRIC BYPASS WITH UPPER ENDOSCOPY AND REMOVAL OF LAP BAND     POLYPECTOMY  11/10/2022   Procedure: POLYPECTOMY;  Surgeon: Sergio Dandy, MD;  Location: [facility];  Service: Gastroenterology;;   RADIAL HEAD ARTHROPLASTY Left 02/03/2019   Procedure: LEFT RADIAL HEAD ARTHROPLASTY;  Surgeon: Adah Acron, MD;  Location: [facility];  Service: Orthopedics;  Laterality: Left;  AXILLARY BLOCK VS BIER BLOCK   TOTAL HIP ARTHROPLASTY Left 01/22/2021   Procedure: LEFT TOTAL HIP ARTHROPLASTY ANTERIOR APPROACH;  Surgeon: Adah Acron, MD;  Location: [facility];  Service: Orthopedics;  Laterality: Left;   TUBAL LIGATION     Patient Active Problem List   Diagnosis Date Noted   Lumbago  09/28/2023   Chronic diastolic HF (heart failure) (HCC) 01/19/2023   Dyslipidemia 01/19/2023   Heme positive stool 11/10/2022   Polyp of descending colon 11/10/2022   Globus sensation 11/10/2022   Severe persistent asthma without complication 10/08/2022   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) 10/08/2022   Perennial allergic rhinitis 10/08/2022   COPD exacerbation (HCC) 04/18/2022   Right hip pain 02/17/2022   Cough 12/05/2021   Type 2 diabetes mellitus without complication, without long-term current use of insulin  (HCC) 11/05/2021   Trigger finger, left middle finger 10/02/2021   COPD with acute exacerbation (HCC) 09/28/2021   Acute exacerbation of COPD with asthma (HCC) 09/27/2021   Other intervertebral disc degeneration, lumbar region 07/15/2021   History of total hip arthroplasty, left 03/07/2021   Trochanteric bursitis, left hip 12/23/2020   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   Diastolic dysfunction 11/25/2020   Asthma exacerbation 11/13/2020   Menopause 11/08/2020   Medication management 04/30/2020   Gastroesophageal reflux disease 03/14/2020   OSA (obstructive sleep apnea) 02/15/2020   Abnormal findings on diagnostic imaging of lung 01/08/2020   Educated about COVID-19 virus infection 11/30/2019   Aortic atherosclerosis (HCC) 11/30/2019   Chest pain 11/26/2019   Acute asthma exacerbation 05/22/2019   Left radial head fracture 02/03/2019   Closed fracture of radial head 01/29/2019   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) (HCC) 10/19/2018   Insomnia 10/19/2018   Elbow pain, chronic, left 10/19/2018   Dyspnea 10/12/2017  Pancolitis (HCC) 05/20/2017   H/O Spinal surgery 01/18/2015   Essential hypertension 01/18/2015   Obesity 01/18/2015    PCP: Valerie Gather, MD  REFERRING PROVIDER: Diedra Fowler, MD  REFERRING DIAG:  [phone] (ICD-10-CM) - Chronic right-sided  low back pain, unspecified whether sciatica present    Rationale for Evaluation and Treatment: Rehabilitation  THERAPY DIAG:  No diagnosis found.  ONSET DATE: at least 5 years  SUBJECTIVE:                                                                                                                                                                                           SUBJECTIVE STATEMENT: Pt reports severe back pain for the past couple of years. Had to go to the ER last night and was given pain medications. Reports she has been suffering from spinal stenosis. Painful to bend and walk. Pt states it's been getting worse. Pt states she had a spinal stimulator but didn't work so she took it out.   PERTINENT HISTORY:  medical history significant for hypertension, COPD/asthma, arthritis, anxiety, GERD, L THA  PAIN:  Are you having pain? Yes: NPRS scale: 7 currently, at worst 9 Pain location: lower lumbar to sacrum Pain description: sharp, radiates down back of legs Aggravating factors: bending, walking (tolerates ~10 min of walking), stairs Relieving factors: Constant pain  PRECAUTIONS: None  RED FLAGS: None   WEIGHT BEARING RESTRICTIONS: No  FALLS:  Has patient fallen in last 6 months? No  LIVING ENVIRONMENT: Lives with: lives with their family grandson Lives in: House/apartment Stairs: 6 steps up/down on R Has following equipment at home: None  OCCUPATION: Retired  PLOF: Independent  PATIENT GOALS: Improve overall function  NEXT MD VISIT: n/a  OBJECTIVE:  Note: Objective measures were completed at Evaluation unless otherwise noted.  DIAGNOSTIC FINDINGS:  MRI 09/06/23 IMPRESSION: 1. Progressive, moderate to severe multifactorial spinal stenosis at L3-4 and mild-to-moderate spinal stenosis at L2-3. 2. Unchanged severe spinal stenosis at L4-5. 3. Unchanged mild-to-moderate neural foraminal stenosis at L4-5 and L5-S1.  XRs of the lumbar spine from 09/30/2023  were independently reviewed and  interpreted, showing grade 1 spondylolisthesis at L4/5.  Disc height loss  at L4/5 and L5/S1.  No fracture or dislocation seen.   PATIENT SURVEYS:  The Patient-Specific Functional Scale  Initial:  I am going to ask you to identify up to 3 important activities that you are unable to do or are having difficulty with as a result of this problem.  Today are there any activities that you are unable to do or having difficulty with because of this?  (Patient shown scale  and patient rated each activity)  Follow up: When you first came in you had difficulty performing these activities.  Today do you still have difficulty?  Patient-Specific activity scoring scheme (Point to one number):  0 1 2 3 4 5 6 7 8 9  10 Unable                                                                                                          Able to perform To perform                                                                                                    activity at the same Activity         Level as before                                                                                                                       Injury or problem Activity Initial (eval): Follow up:  Walking 8   2.   Climbing up stairs  8   3.   Stretching 6   AVERAGE 22/3 = 7.3      COGNITION: Overall cognitive status: Within functional limits for tasks assessed     SENSATION: WFL  MUSCLE LENGTH: Hamstrings: Right >80 deg; Left >90 deg Thomas test: shortened bilat  POSTURE: Increased lumbar lordosis and ant pelvic tilt  PALPATION: Pt reports no overt tenderness to palpation of muscles but points to pain in lower lumbar/upper sacrum  LUMBAR ROM: TBA  AROM eval  Flexion   Extension   Right lateral flexion   Left lateral flexion   Right rotation 100% but pain  Left rotation 100% but pain   (Blank rows = not tested)   LOWER EXTREMITY MMT:    MMT Right eval Left eval  Hip  flexion 4 3+  Hip extension 4 4-  Hip abduction 3+ 3  Hip adduction    Hip internal rotation    Hip external rotation    Knee flexion 4 3+  Knee extension 5 5  Ankle dorsiflexion    Ankle plantarflexion    Ankle inversion  Ankle eversion     (Blank rows = not tested)  LUMBAR SPECIAL TESTS:  Straight leg raise test: Negative, FABER test: pulls into mid back bilat, and Thomas test: shortened hip flexors, pulls into back  FUNCTIONAL TESTS:  Did not assess  GAIT: Distance walked: Into clinic Assistive device utilized: None Level of assistance: Complete Independence Comments: Increased lumbar ext and ant pelvic tilt                   TREATMENT         DATE: 11/22/2023 Therex:    TREATMENT         DATE: 10/29/23 Self care: log roll for transferring in/out of bed, initial HEP, POC, aquatics, role of exercises and PT   PATIENT EDUCATION:  Education details: see above Person educated: Patient Education method: Programmer, multimedia, Demonstration, and Handouts Education comprehension: verbalized understanding, returned demonstration, and needs further education  HOME EXERCISE PROGRAM: Access Code: [email or web address] URL: [email or web address] Date: 10/29/2023 Prepared by: Gellen April Erman [place]  Exercises - Clamshell  - 1 x daily - 7 x weekly - 2 sets - 10 reps - Supine Posterior Pelvic Tilt  - 1 x daily - 7 x weekly - 2 sets - 10 reps - Supine Beginner Bridge  - 1 x daily - 7 x weekly - 2 sets - 10 reps  ASSESSMENT:  CLINICAL IMPRESSION: Patient is a 68 y.o. F who was seen today for physical therapy evaluation and treatment for low back pain. PMH significant for chronic back pain and L THA. Imaging demos varying degrees of spinal stenosis throughout her lumbar spine. Assessment was significant for gross bilat hip weakness (L worse than R), decreased knowledge of safe body mechanics/back protective measures, core weakness and increased lumbar lordosis.   OBJECTIVE  IMPAIRMENTS: Abnormal gait, decreased activity tolerance, decreased balance, decreased endurance, decreased knowledge of condition, decreased mobility, difficulty walking, decreased ROM, decreased strength, hypomobility, increased fascial restrictions, impaired flexibility, improper body mechanics, postural dysfunction, obesity, and pain.   ACTIVITY LIMITATIONS: lifting, bending, sitting, standing, squatting, stairs, transfers, bathing, toileting, dressing, and locomotion level  PARTICIPATION LIMITATIONS: meal prep, cleaning, laundry, shopping, and community activity  PERSONAL FACTORS: Age, Fitness, Past/current experiences, and Time since onset of injury/illness/exacerbation are also affecting patient's functional outcome.   REHAB POTENTIAL: Good  CLINICAL DECISION MAKING: Evolving/moderate complexity  EVALUATION COMPLEXITY: Moderate   GOALS: Goals reviewed with patient? Yes  SHORT TERM GOALS: Target date: 11/26/2023   Pt will be ind with initial HEP Baseline: Goal status: INITIAL  2.  Pt will verbalize and demonstrate understanding of good body mechanics to protect her back Baseline:  Goal status: INITIAL    LONG TERM GOALS: Target date: 12/24/2023   Pt will be ind with management and progression of land and aquatic HEP to transition to community wellness Baseline:  Goal status: INITIAL  2.  Pt will be able to demo safe bending and lifting of at least 10# for home tasks Baseline:  Goal status: INITIAL  3.  Pt will be able to walk at least 20 min with tolerable pain Baseline:  Goal status: INITIAL  4.  Pt will have improved PSFS average score by at least 2 more points to demo MCID Baseline:  Goal status: INITIAL    PLAN:  PT FREQUENCY: 2x/week  PT DURATION: 8 weeks  PLANNED INTERVENTIONS: 97164- PT Re-evaluation, 97750- Physical Performance Testing, 97110-Therapeutic exercises, 97530- Therapeutic activity, [id number]- Neuromuscular re-education, 97535- Self Care,  97140- Manual therapy, [id number]- Gait training,  Z6109- Electrical stimulation (unattended), 97016- Vasopneumatic device, [id number]- Ultrasound, 60454- Traction (mechanical), [id number]- Ionotophoresis 4mg /ml Dexamethasone , Patient/Family education, Balance training, Stair training, Taping, Dry Needling, Joint mobilization, Spinal mobilization, Cryotherapy, and Moist heat.  PLAN FOR NEXT SESSION: Assess response to HEP. Work on core and hip strength/stability. Work on Estate manager/land agent (print out education), give info on aquatics.    Bonna Bustard, PT, DPT, OCS, [name] 11/22/23  7:56 AM

## 2023-11-22 NOTE — Telephone Encounter (Signed)
 Called patient after 15 mins no show for appointment today.  Left message and reminder of next appointment time, as well as call back number.    Bonna Bustard, PT, DPT, OCS, [name] 11/22/23  10:38 AM

## 2023-11-23 ENCOUNTER — Ambulatory Visit: Admitting: Physical Therapy

## 2023-11-24 ENCOUNTER — Encounter: Admitting: Rehabilitative and Restorative Service Providers"

## 2023-11-24 ENCOUNTER — Telehealth: Payer: Self-pay | Admitting: Rehabilitative and Restorative Service Providers"

## 2023-11-24 NOTE — Therapy (Incomplete)
 OUTPATIENT PHYSICAL THERAPY TREATMENT   Patient Name: Patty Howard MRN: [id number] DOB:20-May-1956, 68 y.o., female Today's Date: 11/24/2023  END OF SESSION:    Past Medical History:  Diagnosis Date   Anxiety    Arthritis    Asthma    COPD (chronic obstructive pulmonary disease) (HCC)    Fracture    closed displaced left radial head   GERD (gastroesophageal reflux disease)    Hypertension    Sleep apnea    does not wear CPAP   Past Surgical History:  Procedure Laterality Date   BACK SURGERY     spinal stimulator   BIOPSY  11/10/2022   Procedure: BIOPSY;  Surgeon: Sergio Dandy, MD;  Location: [facility];  Service: Gastroenterology;;   BREAST BIOPSY Right 2017   benign   COLONOSCOPY W/ BIOPSIES AND POLYPECTOMY     COLONOSCOPY WITH PROPOFOL  N/A 11/10/2022   Procedure: COLONOSCOPY WITH PROPOFOL ;  Surgeon: Sergio Dandy, MD;  Location: [facility];  Service: Gastroenterology;  Laterality: N/A;   DILATION AND CURETTAGE OF UTERUS     ESOPHAGOGASTRODUODENOSCOPY (EGD) WITH PROPOFOL  N/A 11/10/2022   Procedure: ESOPHAGOGASTRODUODENOSCOPY (EGD) WITH PROPOFOL ;  Surgeon: Sergio Dandy, MD;  Location: [facility];  Service: Gastroenterology;  Laterality: N/A;   LAPAROSCOPIC ROUX-EN-Y GASTRIC BYPASS WITH UPPER ENDOSCOPY AND REMOVAL OF LAP BAND     POLYPECTOMY  11/10/2022   Procedure: POLYPECTOMY;  Surgeon: Sergio Dandy, MD;  Location: [facility];  Service: Gastroenterology;;   RADIAL HEAD ARTHROPLASTY Left 02/03/2019   Procedure: LEFT RADIAL HEAD ARTHROPLASTY;  Surgeon: Adah Acron, MD;  Location: [facility];  Service: Orthopedics;  Laterality: Left;  AXILLARY BLOCK VS BIER BLOCK   TOTAL HIP ARTHROPLASTY Left 01/22/2021   Procedure: LEFT TOTAL HIP ARTHROPLASTY ANTERIOR APPROACH;  Surgeon: Adah Acron, MD;  Location: [facility];  Service: Orthopedics;  Laterality: Left;   TUBAL LIGATION     Patient Active Problem List   Diagnosis Date Noted   Lumbago  09/28/2023   Chronic diastolic HF (heart failure) (HCC) 01/19/2023   Dyslipidemia 01/19/2023   Heme positive stool 11/10/2022   Polyp of descending colon 11/10/2022   Globus sensation 11/10/2022   Severe persistent asthma without complication 10/08/2022   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) 10/08/2022   Perennial allergic rhinitis 10/08/2022   COPD exacerbation (HCC) 04/18/2022   Right hip pain 02/17/2022   Cough 12/05/2021   Type 2 diabetes mellitus without complication, without long-term current use of insulin  (HCC) 11/05/2021   Trigger finger, left middle finger 10/02/2021   COPD with acute exacerbation (HCC) 09/28/2021   Acute exacerbation of COPD with asthma (HCC) 09/27/2021   Other intervertebral disc degeneration, lumbar region 07/15/2021   History of total hip arthroplasty, left 03/07/2021   Trochanteric bursitis, left hip 12/23/2020   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   Diastolic dysfunction 11/25/2020   Asthma exacerbation 11/13/2020   Menopause 11/08/2020   Medication management 04/30/2020   Gastroesophageal reflux disease 03/14/2020   OSA (obstructive sleep apnea) 02/15/2020   Abnormal findings on diagnostic imaging of lung 01/08/2020   Educated about COVID-19 virus infection 11/30/2019   Aortic atherosclerosis (HCC) 11/30/2019   Chest pain 11/26/2019   Acute asthma exacerbation 05/22/2019   Left radial head fracture 02/03/2019   Closed fracture of radial head 01/29/2019   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) (HCC) 10/19/2018   Insomnia 10/19/2018   Elbow pain, chronic, left 10/19/2018   Dyspnea 10/12/2017  Pancolitis (HCC) 05/20/2017   H/O Spinal surgery 01/18/2015   Essential hypertension 01/18/2015   Obesity 01/18/2015    PCP: Valerie Gather, MD  REFERRING PROVIDER: Diedra Fowler, MD  REFERRING DIAG:  [id number] (ICD-10-CM) - Chronic right-sided  low back pain, unspecified whether sciatica present    Rationale for Evaluation and Treatment: Rehabilitation  THERAPY DIAG:  No diagnosis found.  ONSET DATE: at least 5 years  SUBJECTIVE:                                                                                                                                                                                           SUBJECTIVE STATEMENT: Pt reports severe back pain for the past couple of years. Had to go to the ER last night and was given pain medications. Reports she has been suffering from spinal stenosis. Painful to bend and walk. Pt states it's been getting worse. Pt states she had a spinal stimulator but didn't work so she took it out.   PERTINENT HISTORY:  medical history significant for hypertension, COPD/asthma, arthritis, anxiety, GERD, L THA  PAIN:  Are you having pain? Yes: NPRS scale: 7 currently, at worst 9 Pain location: lower lumbar to sacrum Pain description: sharp, radiates down back of legs Aggravating factors: bending, walking (tolerates ~10 min of walking), stairs Relieving factors: Constant pain  PRECAUTIONS: None  RED FLAGS: None   WEIGHT BEARING RESTRICTIONS: No  FALLS:  Has patient fallen in last 6 months? No  LIVING ENVIRONMENT: Lives with: lives with their family grandson Lives in: House/apartment Stairs: 6 steps up/down on R Has following equipment at home: None  OCCUPATION: Retired  PLOF: Independent  PATIENT GOALS: Improve overall function  NEXT MD VISIT: n/a  OBJECTIVE:  Note: Objective measures were completed at Evaluation unless otherwise noted.  DIAGNOSTIC FINDINGS:  MRI 09/06/23 IMPRESSION: 1. Progressive, moderate to severe multifactorial spinal stenosis at L3-4 and mild-to-moderate spinal stenosis at L2-3. 2. Unchanged severe spinal stenosis at L4-5. 3. Unchanged mild-to-moderate neural foraminal stenosis at L4-5 and L5-S1.  XRs of the lumbar spine from 09/30/2023  were independently reviewed and  interpreted, showing grade 1 spondylolisthesis at L4/5.  Disc height loss  at L4/5 and L5/S1.  No fracture or dislocation seen.   PATIENT SURVEYS:  The Patient-Specific Functional Scale  Initial:  I am going to ask you to identify up to 3 important activities that you are unable to do or are having difficulty with as a result of this problem.  Today are there any activities that you are unable to do or having difficulty with because of this?  (Patient shown scale  and patient rated each activity)  Follow up: When you first came in you had difficulty performing these activities.  Today do you still have difficulty?  Patient-Specific activity scoring scheme (Point to one number):  0 1 2 3 4 5 6 7 8 9  10 Unable                                                                                                          Able to perform To perform                                                                                                    activity at the same Activity         Level as before                                                                                                                       Injury or problem Activity Initial (eval): Follow up:  Walking 8   2.   Climbing up stairs  8   3.   Stretching 6   AVERAGE 22/3 = 7.3      COGNITION: Overall cognitive status: Within functional limits for tasks assessed     SENSATION: WFL  MUSCLE LENGTH: Hamstrings: Right >80 deg; Left >90 deg Thomas test: shortened bilat  POSTURE: Increased lumbar lordosis and ant pelvic tilt  PALPATION: Pt reports no overt tenderness to palpation of muscles but points to pain in lower lumbar/upper sacrum  LUMBAR ROM: TBA  AROM eval  Flexion   Extension   Right lateral flexion   Left lateral flexion   Right rotation 100% but pain  Left rotation 100% but pain   (Blank rows = not tested)   LOWER EXTREMITY MMT:    MMT Right eval Left eval  Hip  flexion 4 3+  Hip extension 4 4-  Hip abduction 3+ 3  Hip adduction    Hip internal rotation    Hip external rotation    Knee flexion 4 3+  Knee extension 5 5  Ankle dorsiflexion    Ankle plantarflexion    Ankle inversion  Ankle eversion     (Blank rows = not tested)  LUMBAR SPECIAL TESTS:  Straight leg raise test: Negative, FABER test: pulls into mid back bilat, and Thomas test: shortened hip flexors, pulls into back  FUNCTIONAL TESTS:  Did not assess  GAIT: Distance walked: Into clinic Assistive device utilized: None Level of assistance: Complete Independence Comments: Increased lumbar ext and ant pelvic tilt                   TREATMENT         DATE: 11/24/2023 Therex:    TREATMENT         DATE: 10/29/23 Self care: log roll for transferring in/out of bed, initial HEP, POC, aquatics, role of exercises and PT   PATIENT EDUCATION:  Education details: see above Person educated: Patient Education method: Programmer, multimedia, Demonstration, and Handouts Education comprehension: verbalized understanding, returned demonstration, and needs further education  HOME EXERCISE PROGRAM: Access Code: [email or web address] URL: [email or web address] Date: 10/29/2023 Prepared by: Gellen April Erman [place]  Exercises - Clamshell  - 1 x daily - 7 x weekly - 2 sets - 10 reps - Supine Posterior Pelvic Tilt  - 1 x daily - 7 x weekly - 2 sets - 10 reps - Supine Beginner Bridge  - 1 x daily - 7 x weekly - 2 sets - 10 reps  ASSESSMENT:  CLINICAL IMPRESSION: Patient is a 68 y.o. F who was seen today for physical therapy evaluation and treatment for low back pain. PMH significant for chronic back pain and L THA. Imaging demos varying degrees of spinal stenosis throughout her lumbar spine. Assessment was significant for gross bilat hip weakness (L worse than R), decreased knowledge of safe body mechanics/back protective measures, core weakness and increased lumbar lordosis.   OBJECTIVE  IMPAIRMENTS: Abnormal gait, decreased activity tolerance, decreased balance, decreased endurance, decreased knowledge of condition, decreased mobility, difficulty walking, decreased ROM, decreased strength, hypomobility, increased fascial restrictions, impaired flexibility, improper body mechanics, postural dysfunction, obesity, and pain.   ACTIVITY LIMITATIONS: lifting, bending, sitting, standing, squatting, stairs, transfers, bathing, toileting, dressing, and locomotion level  PARTICIPATION LIMITATIONS: meal prep, cleaning, laundry, shopping, and community activity  PERSONAL FACTORS: Age, Fitness, Past/current experiences, and Time since onset of injury/illness/exacerbation are also affecting patient's functional outcome.   REHAB POTENTIAL: Good  CLINICAL DECISION MAKING: Evolving/moderate complexity  EVALUATION COMPLEXITY: Moderate   GOALS: Goals reviewed with patient? Yes  SHORT TERM GOALS: Target date: 11/26/2023   Pt will be ind with initial HEP Baseline: Goal status: INITIAL  2.  Pt will verbalize and demonstrate understanding of good body mechanics to protect her back Baseline:  Goal status: INITIAL    LONG TERM GOALS: Target date: 12/24/2023   Pt will be ind with management and progression of land and aquatic HEP to transition to community wellness Baseline:  Goal status: INITIAL  2.  Pt will be able to demo safe bending and lifting of at least 10# for home tasks Baseline:  Goal status: INITIAL  3.  Pt will be able to walk at least 20 min with tolerable pain Baseline:  Goal status: INITIAL  4.  Pt will have improved PSFS average score by at least 2 more points to demo MCID Baseline:  Goal status: INITIAL    PLAN:  PT FREQUENCY: 2x/week  PT DURATION: 8 weeks  PLANNED INTERVENTIONS: 97164- PT Re-evaluation, 97750- Physical Performance Testing, 97110-Therapeutic exercises, 97530- Therapeutic activity, [id number]- Neuromuscular re-education, 97535- Self Care,  97140- Manual therapy, [id number]- Gait training,  [id number]- Electrical stimulation (unattended), 97016- Vasopneumatic device, [id number]- Ultrasound, 03474- Traction (mechanical), [id number]- Ionotophoresis 4mg /ml Dexamethasone , Patient/Family education, Balance training, Stair training, Taping, Dry Needling, Joint mobilization, Spinal mobilization, Cryotherapy, and Moist heat.  PLAN FOR NEXT SESSION: Assess response to HEP. Work on core and hip strength/stability. Work on Estate manager/land agent (print out education), give info on aquatics.    Bonna Bustard, PT, DPT, OCS, [name] 11/24/23  7:55 AM

## 2023-11-24 NOTE — Telephone Encounter (Addendum)
 Called patient after 15 mins no show for appointment today.  Left message with call back number.   This was the second one in a row this week and 3rd overall Cancelled additional appointments per policy  Bonna Bustard, PT, DPT, OCS, [name] 11/24/23  11:19 AM

## 2023-11-25 ENCOUNTER — Ambulatory Visit: Admitting: Orthopedic Surgery

## 2023-11-29 ENCOUNTER — Ambulatory Visit: Admitting: Physical Therapy

## 2023-11-30 ENCOUNTER — Encounter: Admitting: Rehabilitative and Restorative Service Providers"

## 2023-12-02 ENCOUNTER — Encounter: Admitting: Physical Therapy

## 2023-12-02 ENCOUNTER — Ambulatory Visit: Admitting: Physician Assistant

## 2023-12-04 ENCOUNTER — Other Ambulatory Visit: Payer: Self-pay | Admitting: Orthopedic Surgery

## 2023-12-06 ENCOUNTER — Ambulatory Visit: Admitting: Family Medicine

## 2023-12-07 ENCOUNTER — Encounter: Admitting: Rehabilitative and Restorative Service Providers"

## 2023-12-08 ENCOUNTER — Ambulatory Visit: Admitting: Physical Therapy

## 2023-12-08 ENCOUNTER — Ambulatory Visit

## 2023-12-09 ENCOUNTER — Encounter: Admitting: Physical Therapy

## 2023-12-12 NOTE — Progress Notes (Deleted)
   [address] [place] [address] Dept: [phone]  FOLLOW UP NOTE  Patient ID: Patty Howard, female    DOB: 11-Dec-1955  Age: 68 y.o. MRN: [id number] Date of Office Visit: 12/13/2023  Assessment  Chief Complaint: No chief complaint on file.  HPI Patty Howard is a 68 year old female who presents to the clinic for follow-up visit.  She was last seen in this clinic on 05/18/2023 by Dr. 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  Lymph enumeration and B-cell subset analysis on 07/07/2022 indicates protection to 4 out of 23 pneumococcal strains and a high percentage of nonswitched B cells.  Discussed the use of AI scribe software for clinical note transcription with the patient, who gave verbal consent to proceed.  History of Present Illness      Drug Allergies:  No Known Allergies  Physical Exam: There were no vitals taken for this visit.   Physical Exam  Diagnostics:    Assessment and Plan: No diagnosis found.  No orders of the defined types were placed in this encounter.   There are no Patient Instructions on file for this visit.  No follow-ups on file.    Thank you for the opportunity to care for this patient.  Please do not hesitate to contact me with questions.  Arlean Mutter, FNP [facility]

## 2023-12-12 NOTE — Patient Instructions (Incomplete)
 Asthma Continue Breztri  2 puffs twice a day with a spacer to prevent cough or wheeze Continue Accolate  20 mg twice a day to prevent cough or wheeze Continue albuterol  2 puffs once every 4 hours if needed for cough or wheeze You may use albuterol  2 puffs 5 to 15 minutes before activity to decrease cough or wheeze Continue tezspire  injections once every 4 weeks to control asthma symptoms  Allergic rhinitis Continue allergen avoidance measures directed toward dust mite and mold as listed below  In the right nostril, point the applicator out toward the right ear. In the left nostril, point the applicator out toward the left ear Consider saline nasal rinses as needed for nasal symptoms. Use this before any medicated nasal sprays for best result  Reflux Continue dietary and lifestyle modifications as listed below Continue pantoprazole  40 mg daily to control reflux symptoms.  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  Follow up in *** or sooner if needed.  Control of Mold Allergen Mold and fungi can grow on a variety of surfaces provided certain temperature and moisture conditions exist.  Outdoor molds grow on plants, decaying vegetation and soil.  The major outdoor mold, Alternaria and Cladosporium, are found in very high numbers during hot and dry conditions.  Generally, a late Summer - Fall peak is seen for common outdoor fungal spores.  Rain will temporarily lower outdoor mold spore count, but counts rise rapidly when the rainy period ends.  The most important indoor molds are Aspergillus and Penicillium.  Dark, humid and poorly ventilated basements are ideal sites for mold growth.  The next most common sites of mold growth are the  bathroom and the kitchen.  Outdoor [employer] Use air conditioning and keep windows closed Avoid exposure to decaying vegetation. Avoid leaf raking. Avoid grain handling. Consider wearing a face mask if working in moldy areas.  Indoor Mold Control Maintain humidity below 50%. Clean washable surfaces with 5% bleach solution. Remove sources e.g. Contaminated carpets.   Control of Dust Mite Allergen Dust mites play a major role in allergic asthma and rhinitis. They occur in environments with high humidity wherever human skin is found. Dust mites absorb humidity from the atmosphere (ie, they do not drink) and feed on organic matter (including shed human and animal skin). Dust mites are a microscopic type of insect that you cannot see with the naked eye. High levels of dust mites have been detected from mattresses, pillows, carpets, upholstered furniture, bed covers, clothes, soft toys and any woven material. The principal allergen of the dust mite is found in its feces. A gram of dust may contain 1,000 mites and 250,000 fecal particles. Mite antigen is easily measured in the air during house cleaning activities. Dust mites do not bite and do not cause harm to humans, other than by triggering allergies/asthma.  Ways to decrease your exposure to dust mites in your home:  1. Encase mattresses, box springs and pillows with a mite-impermeable barrier or cover  2. Wash sheets, blankets and drapes weekly in hot water (130 F) with detergent and dry them in a dryer on the hot setting.  3. Have the room cleaned frequently with a vacuum cleaner and a damp dust-mop. For carpeting or  rugs, vacuuming with a vacuum cleaner equipped with a high-efficiency particulate air (HEPA) filter. The dust mite allergic individual should not be in a room which is being cleaned and should wait 1 hour after cleaning before going into the room.  4. Do not sleep on upholstered furniture (eg, couches).  5. If possible  removing carpeting, upholstered furniture and drapery from the home is ideal. Horizontal blinds should be eliminated in the rooms where the person spends the most time (bedroom, study, television room). Washable vinyl, roller-type shades are optimal.  6. Remove all non-washable stuffed toys from the bedroom. Wash stuffed toys weekly like sheets and blankets above.  7. Reduce indoor humidity to less than 50%. Inexpensive humidity monitors can be purchased at most hardware stores. Do not use a humidifier as can make the problem worse and are not recommended.

## 2023-12-13 ENCOUNTER — Ambulatory Visit

## 2023-12-13 ENCOUNTER — Ambulatory Visit: Admitting: Family Medicine

## 2023-12-13 DIAGNOSIS — J4550 Severe persistent asthma, uncomplicated: Secondary | ICD-10-CM

## 2023-12-16 ENCOUNTER — Ambulatory Visit: Admitting: Allergy & Immunology

## 2023-12-30 ENCOUNTER — Other Ambulatory Visit: Payer: Self-pay

## 2023-12-30 ENCOUNTER — Emergency Department (HOSPITAL_BASED_OUTPATIENT_CLINIC_OR_DEPARTMENT_OTHER)
Admission: EM | Admit: 2023-12-30 | Discharge: 2023-12-30 | Disposition: A | Discharge status: Home / Self Care | Attending: Emergency Medicine | Admitting: Emergency Medicine

## 2023-12-30 ENCOUNTER — Encounter (HOSPITAL_BASED_OUTPATIENT_CLINIC_OR_DEPARTMENT_OTHER): Payer: Self-pay

## 2023-12-30 DIAGNOSIS — J4489 Other specified chronic obstructive pulmonary disease: Secondary | ICD-10-CM | POA: Insufficient documentation

## 2023-12-30 DIAGNOSIS — S3992XA Unspecified injury of lower back, initial encounter: Secondary | ICD-10-CM | POA: Diagnosis present

## 2023-12-30 DIAGNOSIS — I110 Hypertensive heart disease with heart failure: Secondary | ICD-10-CM | POA: Diagnosis not present

## 2023-12-30 DIAGNOSIS — S39012A Strain of muscle, fascia and tendon of lower back, initial encounter: Secondary | ICD-10-CM | POA: Diagnosis not present

## 2023-12-30 DIAGNOSIS — Z79899 Other long term (current) drug therapy: Secondary | ICD-10-CM | POA: Insufficient documentation

## 2023-12-30 DIAGNOSIS — X58XXXA Exposure to other specified factors, initial encounter: Secondary | ICD-10-CM | POA: Diagnosis not present

## 2023-12-30 DIAGNOSIS — I5032 Chronic diastolic (congestive) heart failure: Secondary | ICD-10-CM | POA: Diagnosis not present

## 2023-12-30 LAB — URINALYSIS, W/ REFLEX TO CULTURE (INFECTION SUSPECTED)
Bilirubin Urine: NEGATIVE
Glucose, UA: NEGATIVE mg/dL
Hgb urine dipstick: NEGATIVE
Ketones, ur: NEGATIVE mg/dL
Leukocytes,Ua: NEGATIVE
Nitrite: NEGATIVE
Protein, ur: NEGATIVE mg/dL
Specific Gravity, Urine: 1.012 (ref 1.005–1.030)
pH: 7 (ref 5.0–8.0)

## 2023-12-30 LAB — BASIC METABOLIC PANEL WITH GFR
Anion gap: 12 (ref 5–15)
BUN: 9 mg/dL (ref 8–23)
CO2: 27 mmol/L (ref 22–32)
Calcium: 9.6 mg/dL (ref 8.9–10.3)
Chloride: 106 mmol/L (ref 98–111)
Creatinine, Ser: 0.72 mg/dL (ref 0.44–1.00)
GFR, Estimated: >60 mL/min (ref >60)
Glucose, Bld: 87 mg/dL (ref 70–99)
Potassium: 3.7 mmol/L (ref 3.5–5.1)
Sodium: 146 mmol/L — ABNORMAL HIGH (ref 135–145)

## 2023-12-30 LAB — CBC WITH DIFFERENTIAL/PLATELET
Abs Immature Granulocytes: 0.02 K/uL (ref 0.00–0.07)
Basophils Absolute: 0 K/uL (ref 0.0–0.1)
Basophils Relative: 1 %
Eosinophils Absolute: 0.2 K/uL (ref 0.0–0.5)
Eosinophils Relative: 2 %
HCT: 36.4 % (ref 36.0–46.0)
Hemoglobin: 11.6 g/dL — ABNORMAL LOW (ref 12.0–15.0)
Immature Granulocytes: 0 %
Lymphocytes Relative: 41 %
Lymphs Abs: 2.7 K/uL (ref 0.7–4.0)
MCH: 26.9 pg (ref 26.0–34.0)
MCHC: 31.9 g/dL (ref 30.0–36.0)
MCV: 84.3 fL (ref 80.0–100.0)
Monocytes Absolute: 0.3 K/uL (ref 0.1–1.0)
Monocytes Relative: 5 %
Neutro Abs: 3.5 K/uL (ref 1.7–7.7)
Neutrophils Relative %: 51 %
Platelets: 313 K/uL (ref 150–400)
RBC: 4.32 MIL/uL (ref 3.87–5.11)
RDW: 15.6 % — ABNORMAL HIGH (ref 11.5–15.5)
WBC: 6.7 K/uL (ref 4.0–10.5)
nRBC: 0 % (ref 0.0–0.2)

## 2023-12-30 MED ORDER — METHOCARBAMOL 500 MG PO TABS: 500.0000 mg | ORAL_TABLET | Freq: Three times a day (TID) | ORAL | 0 refills | Status: DC | PRN

## 2023-12-30 MED ORDER — KETOROLAC TROMETHAMINE 15 MG/ML IJ SOLN
7.5000 mg | Freq: Once | INTRAMUSCULAR | Status: AC
  Administered 2023-12-30: 7.5 mg via INTRAVENOUS
  Filled 2023-12-30: qty 1

## 2023-12-30 MED ORDER — METHOCARBAMOL 1000 MG/10ML IJ SOLN
1000.0000 mg | Freq: Once | INTRAMUSCULAR | Status: AC
  Administered 2023-12-30: 1000 mg via INTRAVENOUS
  Filled 2023-12-30: qty 10

## 2023-12-30 MED ORDER — KETOROLAC TROMETHAMINE 15 MG/ML IJ SOLN
15.0000 mg | Freq: Once | INTRAMUSCULAR | Status: AC
  Administered 2023-12-30: 15 mg via INTRAVENOUS
  Filled 2023-12-30: qty 1

## 2023-12-30 NOTE — ED Triage Notes (Addendum)
 Reporting severe back pain since Sunday. Using Lidocaine  patches and ibuprofen  without relief. Denies nausea/ vomiting or new incontinence.

## 2023-12-30 NOTE — ED Provider Notes (Signed)
 [place] EMERGENCY DEPARTMENT AT [facility] Provider Note  CSN: [id number] Arrival date & time: 12/30/23 9847  Chief Complaint(s) Back Pain  HPI Patty Howard is a 68 y.o. female with a past medical history listed below who presents to the emergency department with several days of right lumbar pain worse with movement and certain positions.  No fall or trauma.  No bladder/bowel incontinence.  No lower extremity weakness or loss of sensation.  Patient has tried over-the-counter medication with minimal relief.  No urinary symptoms.  No other physical complaints   Back Pain   Past Medical History Past Medical History:  Diagnosis Date   Anxiety    Arthritis    Asthma    COPD (chronic obstructive pulmonary disease) (HCC)    Fracture    closed displaced left radial head   GERD (gastroesophageal reflux disease)    Hypertension    Sleep apnea    does not wear CPAP   Patient Active Problem List   Diagnosis Date Noted   Lumbago 09/28/2023   Chronic diastolic HF (heart failure) (HCC) 01/19/2023   Dyslipidemia 01/19/2023   Heme positive stool 11/10/2022   Polyp of descending colon 11/10/2022   Globus sensation 11/10/2022   Severe persistent asthma without complication 10/08/2022   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) 10/08/2022   Perennial allergic rhinitis 10/08/2022   COPD exacerbation (HCC) 04/18/2022   Right hip pain 02/17/2022   Cough 12/05/2021   Type 2 diabetes mellitus without complication, without long-term current use of insulin  (HCC) 11/05/2021   Trigger finger, left middle finger 10/02/2021   COPD with acute exacerbation (HCC) 09/28/2021   Acute exacerbation of COPD with asthma (HCC) 09/27/2021   Other intervertebral disc degeneration, lumbar region 07/15/2021   History of total hip arthroplasty, left 03/07/2021   Trochanteric bursitis, left hip 12/23/2020    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   Diastolic dysfunction 11/25/2020   Asthma exacerbation 11/13/2020   Menopause 11/08/2020   Medication management 04/30/2020   Gastroesophageal reflux disease 03/14/2020   OSA (obstructive sleep apnea) 02/15/2020   Abnormal findings on diagnostic imaging of lung 01/08/2020   Educated about COVID-19 virus infection 11/30/2019   Aortic atherosclerosis (HCC) 11/30/2019   Chest pain 11/26/2019   Acute asthma exacerbation 05/22/2019   Left radial head fracture 02/03/2019   Closed fracture of radial head 01/29/2019   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) (HCC) 10/19/2018   Insomnia 10/19/2018   Elbow pain, chronic, left 10/19/2018   Dyspnea 10/12/2017   Pancolitis (HCC) 05/20/2017   H/O Spinal surgery 01/18/2015   Essential hypertension 01/18/2015   Obesity 01/18/2015   Home Medication(s) Prior to Admission medications   Medication Sig Start Date End Date Taking? Authorizing Provider  methocarbamol  (ROBAXIN ) 500 MG tablet Take 1-2 tablets (500-1,000 mg total) by mouth every 8 (eight) hours as needed for muscle spasms. 12/30/23  Yes Cairo Agostinelli, Raynell Moder, MD  acetaminophen  (TYLENOL ) 325 MG tablet Take 2 tablets (650 mg total) by mouth every 6 (six) hours as needed for mild pain (pain score 1-3) (or Fever >/= 101). 09/29/23   Will Almarie MATSU, MD  albuterol  (PROVENTIL ) (2.5 MG/3ML) 0.083% nebulizer solution Take 3 mLs (2.5 mg total) by nebulization every 6 (six) hours as needed for wheezing or shortness of breath. 08/03/22   Iva Marty Saltness, MD  albuterol  (VENTOLIN  HFA) [phone] Base) MCG/ACT inhaler Inhale 2 puffs into the lungs every 6 (six) hours as needed for wheezing  or shortness of breath. 07/14/23   Olalere, Adewale A, MD  amLODipine  (NORVASC ) 5 MG tablet Take 1 tablet (5 mg total) by mouth daily. 09/16/23   Emelia Josefa HERO, NP  ascorbic acid  (VITAMIN C) 500 MG tablet Take 500 mg by mouth daily.    [provider]   atorvastatin  (LIPITOR) 20 MG tablet Take 1 tablet (20 mg total) by mouth daily. 09/16/23   Emelia Josefa HERO, NP  BREZTRI  AEROSPHERE 160-9-4.8 MCG/ACT AERO inhaler Inhale 2 puffs into the lungs 2 (two) times daily. 07/21/23   [provider]  Diclofenac  35 MG CAPS Take 35 mg by mouth daily as needed. 09/29/23   Will Almarie MATSU, MD  diclofenac  Sodium (VOLTAREN) 1 % GEL Apply 4 g topically 4 (four) times daily. 09/23/23   [provider]  Diethylpropion  HCl CR 75 MG TB24 Take 1 tablet by mouth every morning. 08/13/23   [provider]  fluticasone  (FLONASE ) 50 MCG/ACT nasal spray Place 1 spray into both nostrils daily. 03/22/23   [provider]  furosemide  (LASIX ) 40 MG tablet Take 1 tablet (40 mg total) by mouth daily. 09/16/23   Emelia Josefa HERO, NP  gabapentin  (NEURONTIN ) 300 MG capsule Take 1 capsule (300 mg total) by mouth 2 (two) times daily. 10/18/23 01/16/24  Georgina Ozell LABOR, MD  ipratropium-albuterol  (DUONEB) 0.5-2.5 (3) MG/3ML SOLN 1 vial (3 mg/ 3mL) via nebulizer every 6 hours as needed for cough, wheeze, shortness of breath, chest tightness. 11/18/23   Iva Marty Saltness, MD  levothyroxine  (SYNTHROID ) 100 MCG tablet Take 100 mcg by mouth daily before breakfast.    [provider]  lidocaine  (LIDODERM ) 5 % Place 1 patch onto the skin daily. Remove & Discard patch within 12 hours or as directed by MD 07/04/23   Edwardo Marsa HERO, PA-C  LINZESS  290 MCG CAPS capsule Take 290 mcg by mouth daily. 03/17/22   [provider]  losartan -hydrochlorothiazide  (HYZAAR) 100-25 MG tablet Take 1 tablet by mouth daily. 09/16/23   Emelia Josefa HERO, NP  methylPREDNISolone  (MEDROL  DOSEPAK) 4 MG TBPK tablet As directed 10/28/23   Rancour, Garnette, MD  omeprazole  (PRILOSEC) 40 MG capsule Take 1 capsule (40 mg total) by mouth daily. STOP PANTOPRAZOLE  02/03/23 09/28/23  Nandigam, Kavitha V, MD  ondansetron  (ZOFRAN ) 4 MG tablet Take 1 tablet (4 mg total) by mouth every 6  (six) hours as needed for nausea. 09/29/23   Will Almarie MATSU, MD  oxyCODONE  (OXY IR/ROXICODONE ) 5 MG immediate release tablet Take 5 mg by mouth 3 (three) times daily as needed. 09/23/23   [provider]  potassium chloride  SA (KLOR-CON  M) 20 MEQ tablet Take 1 tablet (20 mEq total) by mouth daily. 09/16/23 10/16/23  Emelia Josefa HERO, NP  tezepelumab -ekko (TEZSPIRE ) 210 MG/1. syringe Inject 1.91 mLs (210 mg total) into the skin every 28 (twenty-eight) days. 07/12/23   Iva Marty Saltness, MD  Vitamin D , Ergocalciferol , (DRISDOL ) 1.25 MG (50000 UNIT) CAPS capsule Take 50,000 Units by mouth every Monday.  10/27/19   [provider]  zafirlukast  (ACCOLATE ) 20 MG tablet TAKE 1 TABLET (20 MG TOTAL) BY MOUTH 2 (TWO) TIMES DAILY BEFORE A MEAL. 12/28/22 09/28/23  Cari Arlean HERO, FNP  Allergies Patient has no known allergies.  Review of Systems Review of Systems  Musculoskeletal:  Positive for back pain.   As noted in HPI  Physical Exam Vital Signs  I have reviewed the triage vital signs BP (!) 140/81   Pulse 90   Temp 98.9 F (37.2 C) (Oral)   Resp 18   Ht 5' 10 (1.778 m)   Wt 129.3 kg   SpO2 97%   BMI 40.89 kg/m   Physical Exam Vitals reviewed.  Constitutional:      General: She is not in acute distress.    Appearance: She is well-developed. She is not diaphoretic.  HENT:     Head: Normocephalic and atraumatic.     Right Ear: External ear normal.     Left Ear: External ear normal.     Nose: Nose normal.  Eyes:     General: No scleral icterus.    Conjunctiva/sclera: Conjunctivae normal.  Neck:     Trachea: Phonation normal.  Cardiovascular:     Rate and Rhythm: Normal rate and regular rhythm.  Pulmonary:     Effort: Pulmonary effort is normal. No respiratory distress.     Breath sounds: No stridor.  Abdominal:     General: There  is no distension.  Musculoskeletal:        General: Normal range of motion.     Cervical back: Normal range of motion.     Lumbar back: Spasms and tenderness present.       Back:     Comments: Spine Exam: Strength: 5/5 throughout LE bilaterally  Sensation: Intact to light touch in proximal and distal LE bilaterally    Neurological:     Mental Status: She is alert and oriented to person, place, and time.  Psychiatric:        Behavior: Behavior normal.     ED Results and Treatments Labs (all labs ordered are listed, but only abnormal results are displayed) Labs Reviewed  CBC WITH DIFFERENTIAL/PLATELET - Abnormal; Notable for the following components:      Result Value   Hemoglobin 11.6 (*)    RDW 15.6 (*)    All other components within normal limits  BASIC METABOLIC PANEL WITH GFR - Abnormal; Notable for the following components:   Sodium 146 (*)    All other components within normal limits  URINALYSIS, W/ REFLEX TO CULTURE (INFECTION SUSPECTED) - Abnormal; Notable for the following components:   Color, Urine COLORLESS (*)    Bacteria, UA RARE (*)    All other components within normal limits                                                                                                                         EKG  EKG Interpretation Date/Time:    Ventricular Rate:    PR Interval:    QRS Duration:    QT Interval:    QTC Calculation:   R Axis:      Text Interpretation:  Radiology No results found.  Medications Ordered in ED Medications  ketorolac  (TORADOL ) 15 MG/ML injection 15 mg (15 mg Intravenous Given 12/30/23 0344)  methocarbamol  (ROBAXIN ) injection 1,000 mg (1,000 mg Intravenous Given 12/30/23 0345)  ketorolac  (TORADOL ) 15 MG/ML injection 7.5 mg (7.5 mg Intravenous Given 12/30/23 0537)   Procedures Procedures  (including critical care time) Medical Decision Making / ED Course   Medical Decision Making Amount and/or Complexity of Data  Reviewed Labs: ordered.  Risk Prescription drug management.    Lower back pain Differential diagnosis considered No trauma concerning for fracture or dislocation. CBC without leukocytosis or anemia.  Metabolic panel without significant electrolyte derangements.  UA without evidence of infection or hematuria concerning for stone. Doubt acute aortic process. No infection concerning for abscess. Favoring muscular strain/spasm given exam.  Given with IV Toradol  and Robaxin  which provided relief.    Final Clinical Impression(s) / ED Diagnoses Final diagnoses:  Strain of lumbar region, initial encounter   The patient appears reasonably screened and/or stabilized for discharge and I doubt any other medical condition or other [facility] requiring further screening, evaluation, or treatment in the ED at this time. I have discussed the findings, Dx and Tx plan with the patient/family who expressed understanding and agree(s) with the plan. Discharge instructions discussed at length. The patient/family was given strict return precautions who verbalized understanding of the instructions. No further questions at time of discharge.  Disposition: Discharge  Condition: Good  ED Discharge Orders          Ordered    methocarbamol  (ROBAXIN ) 500 MG tablet  Every 8 hours PRN        12/30/23 0615             Follow Up: Gretta Lacinda Browning, MD [address]  [address] [place] [address] [phone]  Call  to schedule an appointment for close follow up    This chart was dictated using voice recognition software.  Despite best efforts to proofread,  errors can occur which can change the documentation meaning.    Trine Raynell Moder, MD 12/30/23 [phone]

## 2023-12-30 NOTE — Discharge Instructions (Signed)

## 2024-01-06 ENCOUNTER — Encounter (HOSPITAL_COMMUNITY): Payer: Self-pay | Admitting: Emergency Medicine

## 2024-01-06 ENCOUNTER — Emergency Department (HOSPITAL_COMMUNITY)

## 2024-01-06 ENCOUNTER — Other Ambulatory Visit: Payer: Self-pay

## 2024-01-06 ENCOUNTER — Emergency Department (HOSPITAL_COMMUNITY)
Admission: EM | Admit: 2024-01-06 | Discharge: 2024-01-06 | Disposition: A | Discharge status: Home / Self Care | Attending: Emergency Medicine | Admitting: Emergency Medicine

## 2024-01-06 DIAGNOSIS — Z7951 Long term (current) use of inhaled steroids: Secondary | ICD-10-CM | POA: Insufficient documentation

## 2024-01-06 DIAGNOSIS — M62838 Other muscle spasm: Secondary | ICD-10-CM | POA: Insufficient documentation

## 2024-01-06 DIAGNOSIS — Z79899 Other long term (current) drug therapy: Secondary | ICD-10-CM | POA: Insufficient documentation

## 2024-01-06 DIAGNOSIS — I10 Essential (primary) hypertension: Secondary | ICD-10-CM | POA: Insufficient documentation

## 2024-01-06 DIAGNOSIS — J449 Chronic obstructive pulmonary disease, unspecified: Secondary | ICD-10-CM | POA: Insufficient documentation

## 2024-01-06 DIAGNOSIS — R109 Unspecified abdominal pain: Secondary | ICD-10-CM | POA: Diagnosis present

## 2024-01-06 DIAGNOSIS — E119 Type 2 diabetes mellitus without complications: Secondary | ICD-10-CM | POA: Insufficient documentation

## 2024-01-06 LAB — URINALYSIS, ROUTINE W REFLEX MICROSCOPIC
Bacteria, UA: NONE SEEN
Bilirubin Urine: NEGATIVE
Glucose, UA: NEGATIVE mg/dL
Hgb urine dipstick: NEGATIVE
Ketones, ur: NEGATIVE mg/dL
Leukocytes,Ua: NEGATIVE
Nitrite: NEGATIVE
Protein, ur: 30 mg/dL — AB
Specific Gravity, Urine: 1.029 (ref 1.005–1.030)
pH: 5 (ref 5.0–8.0)

## 2024-01-06 LAB — CBC WITH DIFFERENTIAL/PLATELET
Abs Immature Granulocytes: 0.01 K/uL (ref 0.00–0.07)
Basophils Absolute: 0 K/uL (ref 0.0–0.1)
Basophils Relative: 0 %
Eosinophils Absolute: 0.1 K/uL (ref 0.0–0.5)
Eosinophils Relative: 2 %
HCT: 38.2 % (ref 36.0–46.0)
Hemoglobin: 11.7 g/dL — ABNORMAL LOW (ref 12.0–15.0)
Immature Granulocytes: 0 %
Lymphocytes Relative: 33 %
Lymphs Abs: 1.9 K/uL (ref 0.7–4.0)
MCH: 26 pg (ref 26.0–34.0)
MCHC: 30.6 g/dL (ref 30.0–36.0)
MCV: 84.9 fL (ref 80.0–100.0)
Monocytes Absolute: 0.3 K/uL (ref 0.1–1.0)
Monocytes Relative: 6 %
Neutro Abs: 3.3 K/uL (ref 1.7–7.7)
Neutrophils Relative %: 59 %
Platelets: 321 K/uL (ref 150–400)
RBC: 4.5 MIL/uL (ref 3.87–5.11)
RDW: 15.2 % (ref 11.5–15.5)
WBC: 5.7 K/uL (ref 4.0–10.5)
nRBC: 0 % (ref 0.0–0.2)

## 2024-01-06 LAB — COMPREHENSIVE METABOLIC PANEL WITH GFR
ALT: 16 U/L (ref 0–44)
AST: 20 U/L (ref 15–41)
Albumin: 4 g/dL (ref 3.5–5.0)
Alkaline Phosphatase: 79 U/L (ref 38–126)
Anion gap: 12 (ref 5–15)
BUN: 13 mg/dL (ref 8–23)
CO2: 22 mmol/L (ref 22–32)
Calcium: 9.8 mg/dL (ref 8.9–10.3)
Chloride: 106 mmol/L (ref 98–111)
Creatinine, Ser: 0.4 mg/dL — ABNORMAL LOW (ref 0.44–1.00)
GFR, Estimated: >60 mL/min (ref >60)
Glucose, Bld: 101 mg/dL — ABNORMAL HIGH (ref 70–99)
Potassium: 4 mmol/L (ref 3.5–5.1)
Sodium: 140 mmol/L (ref 135–145)
Total Bilirubin: 0.5 mg/dL (ref 0.0–1.2)
Total Protein: 7.2 g/dL (ref 6.5–8.1)

## 2024-01-06 MED ORDER — OXYCODONE HCL 5 MG PO TABS: 5.0000 mg | ORAL_TABLET | Freq: Four times a day (QID) | ORAL | 0 refills | Status: AC | PRN

## 2024-01-06 MED ORDER — KETOROLAC TROMETHAMINE 15 MG/ML IJ SOLN
15.0000 mg | Freq: Once | INTRAMUSCULAR | Status: AC
  Administered 2024-01-06: 15 mg via INTRAVENOUS
  Filled 2024-01-06: qty 1

## 2024-01-06 MED ORDER — HYDROCODONE-ACETAMINOPHEN 5-325 MG PO TABS
1.0000 | ORAL_TABLET | Freq: Once | ORAL | Status: AC
  Administered 2024-01-06: 1 via ORAL
  Filled 2024-01-06: qty 1

## 2024-01-06 MED ORDER — PREDNISONE 20 MG PO TABS: 40.0000 mg | ORAL_TABLET | Freq: Every day | ORAL | 0 refills | Status: AC

## 2024-01-06 MED ORDER — DEXAMETHASONE SODIUM PHOSPHATE 10 MG/ML IJ SOLN
10.0000 mg | Freq: Once | INTRAMUSCULAR | Status: AC
  Administered 2024-01-06: 10 mg via INTRAVENOUS
  Filled 2024-01-06: qty 1

## 2024-01-06 NOTE — ED Triage Notes (Signed)
 Pt presents reporting severe pain to right back radiating to right side x 3 weeks.  Reports being seen at her PCP and at [facility] for same.  Reports she is given medication but has not had any tests or scans.  Tearful at time of triage.  Reporting not able to sleep d/t pain.

## 2024-01-06 NOTE — Discharge Instructions (Addendum)
 These follow-up with your primary care doctor.  Lab work today shows normal kidney function, normal liver function.  There is no sign of infection.  You do have degenerative disc disease on your CT scan of your lumbar spine.  Turn to ER with worsening symptoms. Take Tylenol  every 8 hours. Take Robaxin  twice daily.  Use lidocaine  patch, ice and heat.  Take oxycodone  for breakthrough pain.  Use 5 days of steroids.

## 2024-01-06 NOTE — ED Provider Notes (Signed)
 [place] EMERGENCY DEPARTMENT AT [facility] Provider Note   CSN: [id number] Arrival date & time: 01/06/24  1138     Patient presents with: Flank Pain   Patty Howard is a 68 y.o. female with past medical history of COPD, hypertension, hyperlipidemia, type 2 diabetes, GERD presents to emergency room with complaint of right flank pain.  Patient reports that she has had intermittent right sided flank pain for about 3 weeks.  It is worse when laying down flat or with movements.  She has been trying Robaxin .  Denies any injury trauma or fall.  No fever.  No saddle anesthesia or loss of bowel or bladder.  No history of cancer or IV drug use. No radicular symptoms.     Flank Pain       Prior to Admission medications   Medication Sig Start Date End Date Taking? Authorizing Provider  acetaminophen  (TYLENOL ) 325 MG tablet Take 2 tablets (650 mg total) by mouth every 6 (six) hours as needed for mild pain (pain score 1-3) (or Fever >/= 101). 09/29/23   Will Almarie MATSU, MD  albuterol  (PROVENTIL ) (2.5 MG/3ML) 0.083% nebulizer solution Take 3 mLs (2.5 mg total) by nebulization every 6 (six) hours as needed for wheezing or shortness of breath. 08/03/22   Iva Marty Saltness, MD  albuterol  (VENTOLIN  HFA) 108 [phone] Base) MCG/ACT inhaler Inhale 2 puffs into the lungs every 6 (six) hours as needed for wheezing or shortness of breath. 07/14/23   Olalere, Adewale A, MD  amLODipine  (NORVASC ) 5 MG tablet Take 1 tablet (5 mg total) by mouth daily. 09/16/23   Emelia Josefa HERO, NP  ascorbic acid  (VITAMIN C) 500 MG tablet Take 500 mg by mouth daily.    [provider]  atorvastatin  (LIPITOR) 20 MG tablet Take 1 tablet (20 mg total) by mouth daily. 09/16/23   Emelia Josefa HERO, NP  BREZTRI  AEROSPHERE 160-9-4.8 MCG/ACT AERO inhaler Inhale 2 puffs into the lungs 2 (two) times daily. 07/21/23   [provider]  Diclofenac  35 MG CAPS Take 35 mg by mouth daily as needed. 09/29/23    Will Almarie MATSU, MD  diclofenac  Sodium (VOLTAREN) 1 % GEL Apply 4 g topically 4 (four) times daily. 09/23/23   [provider]  Diethylpropion  HCl CR 75 MG TB24 Take 1 tablet by mouth every morning. 08/13/23   [provider]  fluticasone  (FLONASE ) 50 MCG/ACT nasal spray Place 1 spray into both nostrils daily. 03/22/23   [provider]  furosemide  (LASIX ) 40 MG tablet Take 1 tablet (40 mg total) by mouth daily. 09/16/23   Emelia Josefa HERO, NP  gabapentin  (NEURONTIN ) 300 MG capsule Take 1 capsule (300 mg total) by mouth 2 (two) times daily. 10/18/23 01/16/24  Georgina Ozell LABOR, MD  ipratropium-albuterol  (DUONEB) 0.5-2.5 (3) MG/3ML SOLN 1 vial (3 mg/ 3mL) via nebulizer every 6 hours as needed for cough, wheeze, shortness of breath, chest tightness. 11/18/23   Iva Marty Saltness, MD  levothyroxine  (SYNTHROID ) 100 MCG tablet Take 100 mcg by mouth daily before breakfast.    [provider]  lidocaine  (LIDODERM ) 5 % Place 1 patch onto the skin daily. Remove & Discard patch within 12 hours or as directed by MD 07/04/23   Schutt, Marsa HERO, PA-C  LINZESS  290 MCG CAPS capsule Take 290 mcg by mouth daily. 03/17/22   [provider]  losartan -hydrochlorothiazide  (HYZAAR) 100-25 MG tablet Take 1 tablet by mouth daily. 09/16/23   Emelia Josefa HERO, NP  methocarbamol  (ROBAXIN )  500 MG tablet Take 1-2 tablets (500-1,000 mg total) by mouth every 8 (eight) hours as needed for muscle spasms. 12/30/23   Trine Raynell Moder, MD  methylPREDNISolone  (MEDROL  DOSEPAK) 4 MG TBPK tablet As directed 10/28/23   Rancour, Garnette, MD  omeprazole  (PRILOSEC) 40 MG capsule Take 1 capsule (40 mg total) by mouth daily. STOP PANTOPRAZOLE  02/03/23 09/28/23  Nandigam, Kavitha V, MD  ondansetron  (ZOFRAN ) 4 MG tablet Take 1 tablet (4 mg total) by mouth every 6 (six) hours as needed for nausea. 09/29/23   Will Almarie MATSU, MD  oxyCODONE  (OXY IR/ROXICODONE ) 5 MG immediate release tablet Take 5 mg by  mouth 3 (three) times daily as needed. 09/23/23   [provider]  potassium chloride  SA (KLOR-CON  M) 20 MEQ tablet Take 1 tablet (20 mEq total) by mouth daily. 09/16/23 10/16/23  Emelia Josefa HERO, NP  tezepelumab -ekko (TEZSPIRE ) 210 MG/1. syringe Inject 1.91 mLs (210 mg total) into the skin every 28 (twenty-eight) days. 07/12/23   Iva Marty Saltness, MD  Vitamin D , Ergocalciferol , (DRISDOL ) 1.25 MG (50000 UNIT) CAPS capsule Take 50,000 Units by mouth every Monday.  10/27/19   [provider]  zafirlukast  (ACCOLATE ) 20 MG tablet TAKE 1 TABLET (20 MG TOTAL) BY MOUTH 2 (TWO) TIMES DAILY BEFORE A MEAL. 12/28/22 09/28/23  Cari Arlean HERO, FNP    Allergies: Patient has no known allergies.    Review of Systems  Genitourinary:  Positive for flank pain.    Updated Vital Signs BP (!) 187/111 (BP Location: Left Arm)   Pulse (!) 105   Temp 99 F (37.2 C) (Oral)   Resp 18   SpO2 98%   Physical Exam Vitals and nursing note reviewed.  Constitutional:      General: She is not in acute distress.    Appearance: She is not toxic-appearing.  HENT:     Head: Normocephalic and atraumatic.  Eyes:     General: No scleral icterus.    Conjunctiva/sclera: Conjunctivae normal.  Cardiovascular:     Rate and Rhythm: Normal rate and regular rhythm.     Pulses: Normal pulses.     Heart sounds: Normal heart sounds.  Pulmonary:     Effort: Pulmonary effort is normal. No respiratory distress.     Breath sounds: Normal breath sounds.  Abdominal:     General: Abdomen is flat. Bowel sounds are normal.     Palpations: Abdomen is soft.     Tenderness: There is no abdominal tenderness. There is right CVA tenderness. There is no left CVA tenderness.  Musculoskeletal:     Comments: TTP over paravertebral lumbar musculature.   Skin:    General: Skin is warm and dry.     Findings: No lesion.  Neurological:     General: No focal deficit present.     Mental Status: She is alert and oriented to  person, place, and time. Mental status is at baseline.     (all labs ordered are listed, but only abnormal results are displayed) Labs Reviewed  COMPREHENSIVE METABOLIC PANEL WITH GFR - Abnormal; Notable for the following components:      Result Value   Glucose, Bld 101 (*)    Creatinine, Ser 0.40 (*)    All other components within normal limits  CBC WITH DIFFERENTIAL/PLATELET - Abnormal; Notable for the following components:   Hemoglobin 11.7 (*)    All other components within normal limits  URINALYSIS, ROUTINE W REFLEX MICROSCOPIC - Abnormal; Notable for the following components:   APPearance  HAZY (*)    Protein, ur 30 (*)    All other components within normal limits    EKG: None  Radiology: CT L-SPINE NO CHARGE Result Date: 01/06/2024 CLINICAL DATA:  Right flank pain, back pain EXAM: CT LUMBAR SPINE WITHOUT CONTRAST TECHNIQUE: Multidetector CT imaging of the lumbar spine was performed without intravenous contrast administration. Multiplanar CT image reconstructions were also generated. RADIATION DOSE REDUCTION: This exam was performed according to the departmental dose-optimization program which includes automated exposure control, adjustment of the mA and/or kV according to patient size and/or use of iterative reconstruction technique. COMPARISON:  Plain films today.  MRI 09/06/2023. FINDINGS: Segmentation: 5 lumbar type vertebrae. Alignment: 2 mm degenerative anterolisthesis of L4 on L5. Vertebrae: No acute fracture or focal pathologic process. Paraspinal and other soft tissues: Negative Disc levels: Degenerative disc disease from L2-3 through L5-S1. Vacuum disc noted at L3-4 and L4-5. Mild broad-based disc bulges at L2-3 through L4-5. Moderate to advanced degenerative facet disease at these levels as well, most pronounced at L4-5 and L5-S1. No significant change since prior MRI. IMPRESSION: No acute bony abnormality. Moderate to advanced degenerative disc and facet disease, unchanged  since prior MRI. Electronically Signed   By: Franky Crease M.D.   On: 01/06/2024 14:51   CT Renal Stone Study Result Date: 01/06/2024 CLINICAL DATA:  Right-sided flank and back pain. EXAM: CT ABDOMEN AND PELVIS WITHOUT CONTRAST TECHNIQUE: Multidetector CT imaging of the abdomen and pelvis was performed following the standard protocol without IV contrast. RADIATION DOSE REDUCTION: This exam was performed according to the departmental dose-optimization program which includes automated exposure control, adjustment of the mA and/or kV according to patient size and/or use of iterative reconstruction technique. COMPARISON:  None Available. FINDINGS: Lower chest: No acute findings. Hepatobiliary: No mass visualized on this unenhanced exam. Gallbladder is unremarkable. No evidence of biliary ductal dilatation. Pancreas: No mass or inflammatory process visualized on this unenhanced exam. Spleen:  Within normal limits in size. Adrenals/Urinary tract: No evidence of urolithiasis or hydronephrosis. Unremarkable unopacified urinary bladder. Stomach/Bowel: Gastric lap band seen in appropriate position. No evidence of obstruction, inflammatory process, or abnormal fluid collections. Normal appendix visualized. Diverticulosis is seen mainly involving the sigmoid colon, however there is no evidence of diverticulitis. Vascular/Lymphatic: No pathologically enlarged lymph nodes identified. No evidence of abdominal aortic aneurysm. Reproductive:  No mass or other significant abnormality. Other:  Tiny fat-containing paraumbilical hernia noted. Musculoskeletal: No suspicious bone lesions identified. Left hip prosthesis noted. IMPRESSION: No evidence of urolithiasis, hydronephrosis, or other acute findings. Colonic diverticulosis, without radiographic evidence of diverticulitis. Tiny fat-containing paraumbilical hernia. Electronically Signed   By: Norleen DELENA Kil M.D.   On: 01/06/2024 14:39   DG Lumbar Spine 2-3 Views Result Date:  01/06/2024 CLINICAL DATA:  Back pain EXAM: LUMBAR SPINE - 2-3 VIEW COMPARISON:  None Available. FINDINGS: Gastric banding device noted the gastric cardiac region. Access port noted. Normal alignment of the vertebral bodies. Mild endplate spurring from L3-L5. No subluxation. IMPRESSION: Mild degenerative change of the lumbar spine.  No acute findings. Electronically Signed   By: Jackquline Boxer M.D.   On: 01/06/2024 12:38     Procedures   Medications Ordered in the ED - No data to display                                  Medical Decision Making Amount and/or Complexity of Data Reviewed Labs: ordered. Radiology: ordered.  Risk Prescription drug management.   Patty Howard 68 y.o. presented today for back pain. Working Ddx: MSK in nature, fracture, epidural hematoma/abscess, cauda equina syndrome, spinal stenosis, spinal malignancy, discitis, spinal infection, spondylitises/ spondylosis, conus medullaris, DDD of the back.  R/o DDx: Cauda equina syndrome and additional dx are less likely than current impression due to history of present illness, physical exam, labs/imaging findings. No focal neurological deficits, no loss of bowel or bladder control.  Denies fever, night sweats, weight loss, h/o cancer, IVDU.  PMHX: COPD, hypertension, hyperlipidemia, type 2 diabetes, GERD    Review of prior external notes: 12/30/23 dx with lumbar strain.   Labs:  No leukocytosis.  No electrolyte abnormality.  Normal kidney function. UA no bacteria, negative for nitrates negative for leukocytes  Imaging: Lumbar x-ray no acute findings.  Question kidney stone with reported right flank pain, obtaining Ct renal and lumbar spine: No acute abdominal pathology.  Moderate to advanced degenerative disc disease, unchanged since prior MRI.  Problem List / ED Course / Critical interventions / Medication management  Patient with past medical history of right flank pain.  Stable and well-appearing.  No  midline tenderness.  Does have paravertebral lumbar tenderness to palpation.  Does have right-sided CVA tenderness but does not endorse any urinary symptoms.  Has had 1 or 2 episodes of nausea.  No focal abdominal pain.  Did obtain renal study to rule out kidney stone however this is negative for acute intra-abdominal pathology.  Does have some degenerative disc disease but no acute findings on lumbar CT.  No red flags that indicate patient needs emergent MRI during stay.  Reports improvement in pain, able to ambulate with cane. Will have her follow-up with primary care, orthopedics and better pain control. I ordered medication including toradol , norco  Reevaluation of the patient after these medicines showed that the patient improved Patients vitals assessed. Upon arrival patient is  hemodynamically stable.    Plan: F/u w/ PCP in 2-3d to ensure resolution of sx.  RICE protocol and pain medicine discussed with patient.  Patient was given return precautions. Patient stable for discharge at this time.  Patient educated on sx/dx and verbalized understanding of plan. Return to ER with new or worsening sx.       Final diagnoses:  Muscle spasm    ED Discharge Orders          Ordered    oxyCODONE  (ROXICODONE ) 5 MG immediate release tablet  Every 6 hours PRN        01/06/24 1545    predniSONE  (DELTASONE ) 20 MG tablet  Daily        01/06/24 1545               Patty Howard, Warren SAILOR, PA-C 01/06/24 1705    Dasie Faden, MD 01/07/24 [phone]

## 2024-01-11 ENCOUNTER — Ambulatory Visit (INDEPENDENT_AMBULATORY_CARE_PROVIDER_SITE_OTHER)

## 2024-01-11 DIAGNOSIS — J4550 Severe persistent asthma, uncomplicated: Secondary | ICD-10-CM | POA: Diagnosis not present

## 2024-01-26 ENCOUNTER — Ambulatory Visit: Admitting: Orthopedic Surgery

## 2024-01-26 DIAGNOSIS — M545 Low back pain, unspecified: Secondary | ICD-10-CM | POA: Diagnosis not present

## 2024-01-26 NOTE — Progress Notes (Signed)
 Orthopedic Spine Surgery Office Note   Assessment: Patient is a 68 y.o. female with several years of back pain which has acutely worsened.  Also noting radiating pain down the posterior aspect of her buttock and leg and left anterolateral thigh.  Has stenosis at L3/4 and L4/5 with a spondylolisthesis at L4/5     Plan: -Patient has tried: Tylenol , ibuprofen , oral steroids, facet injections, lumbar ESI, oxycodone  -She has not gotten relief with facet injections or lumbar ESI so would not repeat those in the future - Patient has tried multiple medications without any relief, so I discussed her remaining options which would involve surgery and pain management.  I explained that spine surgery is predictable at relieving leg pain but is not appreciable at relieving back pain.  After this discussion, patient wanted to proceed with pain management since the vast majority of her pain is in the.  Referral provided to her today -Patient should still try to lose weight -Patient should return to office on an as-needed basis     Patient expressed understanding of the plan and all questions were answered to the patient's satisfaction.    ___________________________________________________________________________     History:   Patient is a 68 y.o. female who presents today for follow up on her lumbar spine.  Patient has recently had worsening of her chronic back pain.  She said she was in bed for the last 3 weeks due to the pain in her back.  She does have some pain that radiates into her lower extremities.  She feels that going along the posterior aspect of the buttock and thigh.  She also has pain over her anterolateral left thigh.  She has had some functional urinary incontinence but no other bowel or bladder incontinence.  No other changes in her bowel or bladder habits.  No saddle anesthesia.  Has been to the ER as a result of the pain.     Treatments tried: Tylenol , ibuprofen , oral steroids, facet  injections, lumbar ESI, oxycodone      Physical Exam:   General: no acute distress, appears stated age Neurologic: alert, answering questions appropriately, following commands Respiratory: unlabored breathing on room air, symmetric chest rise Psychiatric: appropriate affect, normal cadence to speech     MSK (spine):   -Strength exam                                                   Left                  Right EHL                              5/5                  5/5 TA                                 5/5                  5/5 GSC                             5/5  5/5 Knee extension            5/5                  5/5 Hip flexion                    5/5                  5/5   -Sensory exam                           Sensation intact to light touch in L3-S1 nerve distributions of bilateral lower extremities   Imaging: XRs of the lumbar spine from 09/30/2023 were previously independently reviewed and interpreted, showing grade 1 spondylolisthesis at L4/5.  Disc height loss at L4/5 and L5/S1.  No fracture or dislocation seen.  CT of the lumbar spine from 01/02/2024 was independently reviewed and interpreted, showing no fracture or dislocation.  There is vacuum disc phenomenon at L3/4 and L4/5.  Significant facet arthropathy seen at L3/4, L4/5, L5/S1.  Spondylolisthesis seen at L4/5.   MRI of the lumbar spine from 09/06/2023 was previously independently reviewed and interpreted, showing spondylolisthesis at L4/5.  Central, lateral recess, and bilateral foraminal stenosis at L4/5.  Central stenosis at L3/4. Significant facet arthropathy at L4/5 and L5/S1.   DEXA scan from 02/25/2024 showed a T score ofd -1.2   Patient name: Patty Howard Patient MRN: [id number] Date of visit: 01/26/24

## 2024-01-31 ENCOUNTER — Encounter: Payer: Self-pay | Admitting: Internal Medicine

## 2024-01-31 ENCOUNTER — Ambulatory Visit (INDEPENDENT_AMBULATORY_CARE_PROVIDER_SITE_OTHER): Admitting: Internal Medicine

## 2024-01-31 VITALS — BP 118/76 | HR 112 | Temp 98.0°F | Resp 20

## 2024-01-31 DIAGNOSIS — K219 Gastro-esophageal reflux disease without esophagitis: Secondary | ICD-10-CM | POA: Diagnosis not present

## 2024-01-31 DIAGNOSIS — D806 Antibody deficiency with near-normal immunoglobulins or with hyperimmunoglobulinemia: Secondary | ICD-10-CM | POA: Diagnosis not present

## 2024-01-31 DIAGNOSIS — J3089 Other allergic rhinitis: Secondary | ICD-10-CM

## 2024-01-31 DIAGNOSIS — J4550 Severe persistent asthma, uncomplicated: Secondary | ICD-10-CM

## 2024-01-31 MED ORDER — IPRATROPIUM-ALBUTEROL 0.5-2.5 (3) MG/3ML IN SOLN: RESPIRATORY_TRACT | 0 refills | Status: DC

## 2024-01-31 MED ORDER — OMEPRAZOLE 40 MG PO CPDR: 40.0000 mg | DELAYED_RELEASE_CAPSULE | Freq: Every day | ORAL | 0 refills | Status: DC

## 2024-01-31 MED ORDER — AZITHROMYCIN 500 MG PO TABS: ORAL_TABLET | ORAL | 5 refills | Status: DC

## 2024-01-31 MED ORDER — BREZTRI AEROSPHERE 160-9-4.8 MCG/ACT IN AERO: 2.0000 | INHALATION_SPRAY | Freq: Two times a day (BID) | RESPIRATORY_TRACT | 5 refills | Status: AC

## 2024-01-31 MED ORDER — ALBUTEROL SULFATE (2.5 MG/3ML) 0.083% IN NEBU: 2.5000 mg | INHALATION_SOLUTION | Freq: Four times a day (QID) | RESPIRATORY_TRACT | 1 refills | Status: DC | PRN

## 2024-01-31 MED ORDER — ZAFIRLUKAST 20 MG PO TABS: 20.0000 mg | ORAL_TABLET | Freq: Two times a day (BID) | ORAL | 1 refills | Status: AC

## 2024-01-31 MED ORDER — ALBUTEROL SULFATE HFA 108 (90 BASE) MCG/ACT IN AERS: 2.0000 | INHALATION_SPRAY | Freq: Four times a day (QID) | RESPIRATORY_TRACT | 2 refills | Status: DC | PRN

## 2024-01-31 NOTE — Patient Instructions (Addendum)
 1. Severe persistent asthma, uncomplicated - Lung testing looks a little worse today. - Daily controller medication(s): Breztri  two puffs twice daily with spacer + zafirlukast  20mg  twice daily + Tezspire  every month  - Prior to physical activity: albuterol  2 puffs 10-15 minutes before physical activity. - Rescue medications: albuterol  4 puffs every 4-6 hours as needed, albuterol  nebulizer one vial every 4-6 hours as needed or DuoNeb nebulizer one vial every 4-6 hours as needed - Asthma control goals:  * Full participation in all desired activities (may need albuterol  before activity) * Albuterol  use two time or less a week on average (not counting use with activity) * Cough interfering with sleep two time or less a month * Oral steroids no more than once a year * No hospitalizations  2. Chronic rhinitis (dust mites, molds) - Continue with as needed Flonase  one spray per nostril twice daily   3. 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 - This is likely going to be a lifetime treatment.  - I think we can hold off on immunoglobulin infusions at this point.   4. Gastroesophageal reflux disease  - Continue with Protonix  40mg  once daily to control any silent reflux.  5. Return in about 6 months (around 08/02/2024). You can have the follow up appointment with Dr. Iva or a Nurse Practicioner (our Nurse Practitioners are excellent and always have Physician oversight!).

## 2024-01-31 NOTE — Progress Notes (Signed)
 FOLLOW UP Date of Service/Encounter:   01/31/2024  Subjective:  Patty Howard (DOB: [birth year]) is a 68 y.o. 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  For Review, LV was on 05/18/23  with Dr. Iva seen for routine follow-up. See below for summary of history and diagnostics.   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 Normal CH50. On Tezspire  for asthma, failed Dupixent . On azithromycin  for specific antibody deficiency --------------------------------------------------- Today presents for follow-up. Discussed the use of AI scribe software for clinical note transcription with the patient, who gave verbal consent to proceed.  History of Present Illness Patty Howard is a 69 year old female with asthma who presents for follow-up on her respiratory management.  Respiratory symptoms and asthma control - Stable on current asthma regimen, including Breztri  (two puffs twice daily) and Zefirlukast (twice daily) - Uses nebulizer approximately five times daily for mild shortness of breath and mucus build-up, especially in the mornings - No asthma attacks; only mild shortness of breath episodes - No recent infections since discontinuing azithromycin  one month ago - No use of Flonase  due to personal preference - No requirement for  systemic steroids for asthma since last visit or emergency care visits   Back pain and spinal stenosis - History of spinal stenosis with episodes of severe back pain - Previous three-week period of being bedridden due to back pain - Currently under pain management - Scheduled to start physical therapy and water aerobics next week - Systemic steroids used for back pain, not for asthma - Severe back pain can impact breathing     Chart Review: Last Tezspire  01/11/24. CMP 01/06/24: normal AST/ALT  All medications reviewed by clinical staff and updated in chart. No new pertinent medical or surgical history except as noted in HPI.  ROS: All others negative except as noted per HPI.   Objective:  Pulse (!) 112   Temp 98 F (36.7 C) (Temporal)   Resp 20   SpO2 99%  There is no height or weight on file to calculate BMI. Physical Exam: General Appearance:  Alert, cooperative, no distress, appears stated age  Head:  Normocephalic, without obvious abnormality, atraumatic  Eyes:  Conjunctiva clear, EOM's intact  Ears EACs normal bilaterally and normal TMs bilaterally  Nose: Nares normal, hypertrophic turbinates, normal mucosa, and no visible anterior polyps  Throat: Lips, tongue normal; teeth and gums normal, normal posterior oropharynx  Neck: Supple, symmetrical  Lungs:   clear to auscultation bilaterally, Respirations unlabored, no coughing  Heart:  regular rate and rhythm and no murmur, Appears well perfused  Extremities: No edema  Skin: Skin color, texture, turgor normal and no rashes or lesions on visualized portions of skin  Neurologic: No gross deficits   Labs:  Lab Orders  No laboratory test(s) ordered today    Spirometry:  Tracings reviewed. Her effort: Good reproducible efforts. FVC: 1.37L FEV1: 0.89L, 38% predicted FEV1/FVC ratio: 0.65 Interpretation: Spirometry consistent with severe obstructive disease.  Please see scanned spirometry results for  details.  Assessment/Plan  This is a complicated patient who normally sees Dr. Iva, coming today for refills.  She has been off several meds for a few months.  She has been overall stable, so we continued her prior plan with Dr. Iva and she was instructed to follow-up with Dr. Iva in 6 months.   1. Severe persistent asthma, uncomplicated-at goal - Lung testing looks a little worse today. - Daily controller medication(s): Breztri  two puffs twice daily with spacer + zafirlukast  20mg  twice daily + Tezspire  every month  - Prior to physical activity: albuterol  2 puffs 10-15 minutes before physical activity. - Rescue medications: albuterol  4 puffs every 4-6 hours as needed, albuterol  nebulizer one vial every 4-6 hours as needed or DuoNeb nebulizer one vial every 4-6 hours as needed - Asthma control goals:  * Full participation in all desired activities (may need albuterol  before activity) * Albuterol  use two time or less a week on average (not counting use with activity) * Cough interfering with sleep two time or less a month * Oral steroids no more than once a year * No hospitalizations  2. Chronic rhinitis (dust mites, molds)-at goal - Continue with as needed Flonase  one spray per nostril twice daily   3. 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 - This is likely going to be a lifetime treatment.  - I think we can hold off on immunoglobulin infusions at this point.   4. Gastroesophageal reflux disease - at goal - Continue with Protonix  40mg  once daily to control any silent reflux.  5. Return in about 6 months (around 08/02/2024). You can have the follow up appointment with Dr. Iva or a Nurse Practicioner (our Nurse Practitioners are excellent and always have Physician oversight!).   Other:none  Rocky Endow, MD  [facility]

## 2024-02-03 ENCOUNTER — Ambulatory Visit: Admitting: Allergy & Immunology

## 2024-02-15 ENCOUNTER — Ambulatory Visit

## 2024-02-17 ENCOUNTER — Encounter: Payer: Self-pay | Admitting: Cardiology

## 2024-02-23 ENCOUNTER — Ambulatory Visit

## 2024-02-23 DIAGNOSIS — J4550 Severe persistent asthma, uncomplicated: Secondary | ICD-10-CM

## 2024-03-11 ENCOUNTER — Other Ambulatory Visit: Payer: Self-pay | Admitting: Internal Medicine

## 2024-03-16 ENCOUNTER — Other Ambulatory Visit: Payer: Self-pay

## 2024-03-16 ENCOUNTER — Telehealth: Payer: Self-pay

## 2024-03-16 DIAGNOSIS — J4489 Other specified chronic obstructive pulmonary disease: Secondary | ICD-10-CM

## 2024-03-16 DIAGNOSIS — J4550 Severe persistent asthma, uncomplicated: Secondary | ICD-10-CM

## 2024-03-16 NOTE — Telephone Encounter (Signed)
 Pt called in stating she I using a new mail order pharmacy, she said she needs rx for her neb sol- albuterol , I tried to update her mail order pharmacy, she said it is called [facility], unfortunately I was unable to locate this pharmacy in our sytem to send rx refill, she asked for our fax number and I gave it to her- she is calling them to make sure they have the correct information for us  and she will call us  back to verify. -AC,CMA

## 2024-03-17 MED ORDER — ALBUTEROL SULFATE (2.5 MG/3ML) 0.083% IN NEBU: 2.5000 mg | INHALATION_SOLUTION | Freq: Four times a day (QID) | RESPIRATORY_TRACT | 1 refills | Status: AC | PRN

## 2024-03-17 NOTE — Telephone Encounter (Signed)
 Thank you for taking care of that, Rosina! I have never heard of that pharmacy. I wonder if any of the other CMAs have heard of it?   Marty Shaggy, MD [facility] 

## 2024-03-17 NOTE — Addendum Note (Signed)
 Addended by: Rubin Dais E on: 03/17/2024 10:52 AM   Modules accepted: Orders

## 2024-03-17 NOTE — Telephone Encounter (Signed)
 Pharmacy is in the chart, Sent medication to the pharmacy. I called patient to inform her of the prescription being sent in. Left voicemail for a return call.

## 2024-03-17 NOTE — Addendum Note (Signed)
 Addended by: Dell Briner on: 03/17/2024 10:57 AM   Modules accepted: Orders

## 2024-03-23 ENCOUNTER — Ambulatory Visit

## 2024-03-23 DIAGNOSIS — J4550 Severe persistent asthma, uncomplicated: Secondary | ICD-10-CM

## 2024-03-23 NOTE — Progress Notes (Signed)
 Immunotherapy   Patient Details  Name: Patty Howard MRN: [id number] Date of Birth: January 25, 1956  03/23/2024  Patty Howard started injections for  Tezspire  self administration. Following schedule: Every twenty eight days. Frequency:Every four weeks.  Epi-Pen:Not needed. Consent signed previously and patient instructions given on how to successfully administer Tezspire  successfully into the thigh and abdomen. Patient expressed that she was aware on how to inject herself as she does ozempic  weekly. Patient successfully administered herself in the abdomen with Tezspire  without an issue.   Patty Howard 03/23/2024, 1:50 PM

## 2024-03-28 ENCOUNTER — Other Ambulatory Visit: Payer: Self-pay | Admitting: *Deleted

## 2024-03-28 MED ORDER — IPRATROPIUM-ALBUTEROL 0.5-2.5 (3) MG/3ML IN SOLN: RESPIRATORY_TRACT | 1 refills | Status: DC

## 2024-03-30 ENCOUNTER — Telehealth: Payer: Self-pay

## 2024-03-30 NOTE — Telephone Encounter (Signed)
 [name] X1. LMTCB.   I tried calling pt to see if she ever received her cpap machine or not and if she did, was she using it? Ok to keep appt tomorrow.

## 2024-03-31 ENCOUNTER — Ambulatory Visit: Admitting: Adult Health

## 2024-03-31 ENCOUNTER — Encounter: Payer: Self-pay | Admitting: Adult Health

## 2024-03-31 ENCOUNTER — Ambulatory Visit (INDEPENDENT_AMBULATORY_CARE_PROVIDER_SITE_OTHER)

## 2024-03-31 VITALS — BP 124/64 | HR 97 | Temp 97.6°F | Ht 68.0 in | Wt 272.0 lb

## 2024-03-31 DIAGNOSIS — J4489 Other specified chronic obstructive pulmonary disease: Secondary | ICD-10-CM

## 2024-03-31 DIAGNOSIS — G4733 Obstructive sleep apnea (adult) (pediatric): Secondary | ICD-10-CM

## 2024-03-31 DIAGNOSIS — J3089 Other allergic rhinitis: Secondary | ICD-10-CM

## 2024-03-31 MED ORDER — ALBUTEROL SULFATE HFA 108 (90 BASE) MCG/ACT IN AERS: 1.0000 | INHALATION_SPRAY | Freq: Four times a day (QID) | RESPIRATORY_TRACT | 5 refills | Status: AC | PRN

## 2024-03-31 MED ORDER — BENZONATATE 200 MG PO CAPS: 200.0000 mg | ORAL_CAPSULE | Freq: Three times a day (TID) | ORAL | 3 refills | Status: AC | PRN

## 2024-03-31 NOTE — Progress Notes (Unsigned)
 @Patient  ID: Patty Howard, female    DOB: [birth year], 68 y.o.   MRN: [id number]  No chief complaint on file.   Referring provider: Rik Glinda DASEN, FNP  HPI: 68 yo female former followed for Asthma and Allergic Rhinitis and Mild OSA Follows with Asthma and Allergy      TEST/EVENTS :  07/14/23 HST Mild OSA  Discussed the use of AI scribe software for clinical note transcription with the patient, who gave verbal consent to proceed.  History of Present Illness Patty Howard is a 68 year old female with asthma who presents for a six-month follow-up   She follows with Asthma and Allergy. Her breathing has been stable but slightly improved with her current medications. She uses Breztri , two puffs in the morning and two at night, and receives Tezspire  injections monthly. She uses albuterol  as needed and prefers Duoneb for nebulizer treatments. She takes azithromycin  on Monday, Wednesday, and Friday, and Accolate  twice daily. Xyzal  is taken daily for allergies.  She underwent a sleep study in January 2025 that showed mild OSA.  She experiences daytime tiredness and low energy. She previously used a CPAP machine, would like to retry CPAP to see if she can feel better.  Sleep study on July 14, 2023 showed mild obstructive sleep apnea.   She has a persistent cough and uses over-the-counter medications like Robitussin DM and Mucinex , which are not effective. She recalls using Tessalon  Pearls in the past but does not remember their efficacy.     No Known Allergies  Immunization History  Administered Date(s) Administered   Fluad Quad(high Dose 65+) 03/12/2021   Fluad Trivalent(High Dose 65+) 02/24/2023   INFLUENZA, HIGH DOSE SEASONAL PF 03/06/2019, 03/13/2019   Influenza,inj,Quad PF,6+ Mos 04/07/2018   Influenza-Unspecified 03/29/2017, 03/05/2020, 02/25/2022   Moderna Covid-19 Fall Seasonal Vaccine 53yrs & older 08/27/2022   Moderna SARS-COV2 Booster Vaccination  05/03/2020   Moderna Sars-Covid-2 Vaccination 08/19/2019, 09/14/2019   Pneumococcal Polysaccharide-23 03/29/2017   Respiratory Syncytial Virus Vaccine,Recomb Aduvanted(Arexvy) 02/25/2022   Tdap 10/30/2017   Zoster Recombinant(Shingrix) 03/30/2019    Past Medical History:  Diagnosis Date   Anxiety    Arthritis    Asthma    COPD (chronic obstructive pulmonary disease) (HCC)    Fracture    closed displaced left radial head   GERD (gastroesophageal reflux disease)    Hypertension    Sleep apnea    does not wear CPAP    Tobacco History: Social History   Tobacco Use  Smoking Status Former   Current packs/day: 0.00   Average packs/day: 0.5 packs/day for 10.0 years (5.0 ttl pk-yrs)   Types: Cigarettes   Start date: 06/15/1988   Quit date: 06/15/1998   Years since quitting: 25.8   Passive exposure: Past  Smokeless Tobacco Never   Counseling given: Not Answered   Outpatient Medications Prior to Visit  Medication Sig Dispense Refill   acetaminophen  (TYLENOL ) 325 MG tablet Take 2 tablets (650 mg total) by mouth every 6 (six) hours as needed for mild pain (pain score 1-3) (or Fever >/= 101).     albuterol  (PROVENTIL ) (2.5 MG/3ML) 0.083% nebulizer solution Take 3 mLs (2.5 mg total) by nebulization every 6 (six) hours as needed for wheezing or shortness of breath. 75 mL 1   albuterol  (VENTOLIN  HFA) 108 (90 Base) MCG/ACT inhaler Inhale 2 puffs into the lungs every 6 (six) hours as needed for wheezing or shortness of breath. 8 g 2   amLODipine  (NORVASC ) 5 MG tablet Take 1  tablet (5 mg total) by mouth daily. 90 tablet 3   ascorbic acid  (VITAMIN C) 500 MG tablet Take 500 mg by mouth daily.     atorvastatin  (LIPITOR) 20 MG tablet Take 1 tablet (20 mg total) by mouth daily. 90 tablet 3   azithromycin  (ZITHROMAX ) 500 MG tablet Take orally Monday, Wednesday and Friday. 15 tablet 5   BREZTRI  AEROSPHERE 160-9-4.8 MCG/ACT AERO inhaler Inhale 2 puffs into the lungs 2 (two) times daily. 10.7 g 5    Diclofenac  35 MG CAPS Take 35 mg by mouth daily as needed. 60 capsule 1   diclofenac  Sodium (VOLTAREN) 1 % GEL Apply 4 g topically 4 (four) times daily.     Diethylpropion  HCl CR 75 MG TB24 Take 1 tablet by mouth every morning.     fluticasone  (FLONASE ) 50 MCG/ACT nasal spray Place 1 spray into both nostrils daily.     furosemide  (LASIX ) 40 MG tablet Take 1 tablet (40 mg total) by mouth daily. 90 tablet 3   gabapentin  (NEURONTIN ) 300 MG capsule Take 1 capsule (300 mg total) by mouth 2 (two) times daily. 60 capsule 2   ipratropium-albuterol  (DUONEB) 0.5-2.5 (3) MG/3ML SOLN 1 vial (3 mg/ 3mL) via nebulizer every 6 hours as needed for cough, wheeze, shortness of breath, chest tightness. 90 mL 1   levothyroxine  (SYNTHROID ) 100 MCG tablet Take 100 mcg by mouth daily before breakfast.     lidocaine  (LIDODERM ) 5 % Place 1 patch onto the skin daily. Remove & Discard patch within 12 hours or as directed by MD 20 patch 0   LINZESS  290 MCG CAPS capsule Take 290 mcg by mouth daily.     losartan -hydrochlorothiazide  (HYZAAR) 100-25 MG tablet Take 1 tablet by mouth daily. 90 tablet 3   methocarbamol  (ROBAXIN ) 500 MG tablet Take 1-2 tablets (500-1,000 mg total) by mouth every 8 (eight) hours as needed for muscle spasms. 20 tablet 0   methylPREDNISolone  (MEDROL  DOSEPAK) 4 MG TBPK tablet As directed 21 each 0   omeprazole  (PRILOSEC) 40 MG capsule TAKE 1 CAPSULE (40 MG TOTAL) BY MOUTH DAILY. STOP PANTOPRAZOLE  42 capsule 0   ondansetron  (ZOFRAN ) 4 MG tablet Take 1 tablet (4 mg total) by mouth every 6 (six) hours as needed for nausea. 20 tablet 0   oxyCODONE  (ROXICODONE ) 5 MG immediate release tablet Take 1 tablet (5 mg total) by mouth every 6 (six) hours as needed for severe pain (pain score 7-10) or breakthrough pain. 7 tablet 0   potassium chloride  SA (KLOR-CON  M) 20 MEQ tablet Take 1 tablet (20 mEq total) by mouth daily. 90 tablet 3   predniSONE  (DELTASONE ) 20 MG tablet Take 2 tablets (40 mg total) by mouth daily.  10 tablet 0   tezepelumab -ekko (TEZSPIRE ) 210 MG/1. syringe Inject 1.91 mLs (210 mg total) into the skin every 28 (twenty-eight) days. 1.91 mL 11   Vitamin D , Ergocalciferol , (DRISDOL ) 1.25 MG (50000 UNIT) CAPS capsule Take 50,000 Units by mouth every Monday.      zafirlukast  (ACCOLATE ) 20 MG tablet Take 1 tablet (20 mg total) by mouth 2 (two) times daily before a meal. 180 tablet 1   Facility-Administered Medications Prior to Visit  Medication Dose Route Frequency Provider Last Rate Last Admin   tezepelumab -ekko (TEZSPIRE ) 210 MG/1. syringe 210 mg  210 mg Subcutaneous Q28 days Iva Marty Saltness, MD   210 mg at 03/23/24 1323     Review of Systems:   Constitutional:   No  weight loss, night sweats,  Fevers, chills,+  fatigue, or  lassitude.  HEENT:   No headaches,  Difficulty swallowing,  Tooth/dental problems, or  Sore throat,                No sneezing, itching, ear ache, nasal congestion, post nasal drip,   CV:  No chest pain,  Orthopnea, PND, swelling in lower extremities, anasarca, dizziness, palpitations, syncope.   GI  No heartburn, indigestion, abdominal pain, nausea, vomiting, diarrhea, change in bowel habits, loss of appetite, bloody stools.   Resp:   No chest wall deformity  Skin: no rash or lesions.  GU: no dysuria, change in color of urine, no urgency or frequency.  No flank pain, no hematuria   MS:  No joint pain or swelling.  No decreased range of motion.  No back pain.    Physical Exam  BP 124/64   Pulse 97   Temp 97.6 F (36.4 C)   Ht 5' 8 (1.727 m)   Wt 272 lb (123.4 kg)   SpO2 96% Comment: ra  BMI 41.36 kg/m    GEN: A/Ox3; pleasant , NAD, well nourished    HEENT:  /AT,  NOSE-clear, THROAT-clear, no lesions, no postnasal drip or exudate noted.   NECK:  Supple w/ fair ROM; no JVD; normal carotid impulses w/o bruits; no thyromegaly or nodules palpated; no lymphadenopathy.    RESP  Clear  P & A; w/o, wheezes/ rales/ or rhonchi. no  accessory muscle use, no dullness to percussion  CARD:  RRR, no m/r/g, no peripheral edema, pulses intact, no cyanosis or clubbing.  GI:   Soft & nt; nml bowel sounds; no organomegaly or masses detected.   Musco: Warm bil, no deformities or joint swelling noted.   Neuro: alert, no focal deficits noted.    Skin: Warm, no lesions or rashes    Lab Results:  CBC   BNP  No results found for: PROBNP  Imaging: No results found.  tezepelumab -ekko (TEZSPIRE ) 210 MG/1. syringe 210 mg     Date Action Dose Route User   03/23/2024 1323 Given 210 mg Subcutaneous (Left Lower Abdomen) Francis Santana LABOR, CMA   02/23/2024 1202 Given 210 mg Subcutaneous (Left Arm) Merilee, Tiffanie A, CMA          Latest Ref Rng & Units 06/30/2021   10:56 AM  PFT Results  FVC-Pre L 1.95   FVC-Predicted Pre % 66   FVC-Post L 2.01   FVC-Predicted Post % 68   Pre FEV1/FVC % % 69   Post FEV1/FCV % % 71   FEV1-Pre L 1.34   FEV1-Predicted Pre % 58   FEV1-Post L 1.42   DLCO uncorrected ml/min/mmHg 24.99   DLCO UNC% % 111   DLCO corrected ml/min/mmHg 24.99   DLCO COR %Predicted % 111   DLVA Predicted % 142   TLC L 5.27   TLC % Predicted % 93   RV % Predicted % 117     No results found for: NITRICOXIDE      Assessment & Plan:   Assessment and Plan Assessment & Plan Asthma  -appears stable continue follow-up with asthma and allergy Asthma management continues with Breztri , Accolate , and albuterol . She uses Breztri  twice daily and prefers Duoneb over albuterol  for rescue. Azithromycin  is taken thrice weekly. Refill nebulizer medication and albuterol  inhaler at [facility]. Continue Breztri  and Accolate  twice daily, azithromycin  on Monday, Wednesday, and Friday, and use Duoneb as preferred for rescue.  Chronic cough   Chronic cough persists despite over-the-counter treatments.  SABRA  Tessalon  Pearls are prescribed as an alternative. Advise using Delsym  or Robitussin DM  over-the-counter.  Obstructive sleep apnea   Mild obstructive sleep apnea confirmed by sleep study. Symptoms include daytime tiredness and fatigue. CPAP therapy is recommended to improve sleep quality and reduce symptoms. Discussed benefits of CPAP for maintaining oxygen  levels and its positive effects on heart and brain health. Explained the process of obtaining and using CPAP equipment.  Order CPAP therapy, instruct to use it every night, and advise on CPAP maintenance including using distilled water and cleaning. Encourage weight loss as a potential way to reduce sleep apnea.  - discussed how weight can impact sleep and risk for sleep disordered breathing - discussed options to assist with weight loss: combination of diet modification, cardiovascular and strength training exercises   - had an extensive discussion regarding the adverse health consequences related to untreated sleep disordered breathing - specifically discussed the risks for hypertension, coronary artery disease, cardiac dysrhythmias, cerebrovascular disease, and diabetes - lifestyle modification discussed   - discussed how sleep disruption can increase risk of accidents, particularly when driving - safe driving practices were discussed     Allergic rhinitis   Allergic rhinitis is managed with Xyzal  daily. Continue Xyzal  daily.  Obesity with BMI 41-healthy weight loss discussed    Plan  Patient Instructions  Continue on Breztri  2 puffs Twice daily, rinse after use Continue on Accolate  Twice daily   Continue on Xyzal  daily  Continue on TEZSPIRE  Injection every 28 days  Albuterol  inhaler or Duoneb As needed   Delsym  2 tsp Twice daily  for cough As needed   Tessalon  Three times a day  for cough as needed  Chest xray today   Begin CPAP At bedtime, wear all night long for at least 6 hr or more  Work on healthy weight  Do not drive if sleepy  Follow up Dr. Neda or Zyaire Mccleod NP  in  3 months and As needed   Please  contact office for sooner follow up if symptoms do not improve or worsen or seek emergency care       Madelin Stank, NP 03/31/2024

## 2024-03-31 NOTE — Patient Instructions (Addendum)
 Continue on Breztri  2 puffs Twice daily, rinse after use Continue on Accolate  Twice daily   Continue on Xyzal  daily  Continue on TEZSPIRE  Injection every 28 days  Albuterol  inhaler or Duoneb As needed   Delsym  2 tsp Twice daily  for cough As needed   Tessalon  Three times a day  for cough as needed  Chest xray today   Begin CPAP At bedtime, wear all night long for at least 6 hr or more  Work on healthy weight  Do not drive if sleepy  Follow up Dr. Neda or Dylana Shaw NP  in  3 months and As needed   Please contact office for sooner follow up if symptoms do not improve or worsen or seek emergency care

## 2024-04-04 ENCOUNTER — Emergency Department (HOSPITAL_BASED_OUTPATIENT_CLINIC_OR_DEPARTMENT_OTHER)

## 2024-04-04 ENCOUNTER — Emergency Department (HOSPITAL_BASED_OUTPATIENT_CLINIC_OR_DEPARTMENT_OTHER): Admission: EM | Admit: 2024-04-04 | Discharge: 2024-04-04 | Disposition: A | Discharge status: Home / Self Care

## 2024-04-04 ENCOUNTER — Other Ambulatory Visit: Payer: Self-pay

## 2024-04-04 DIAGNOSIS — Z79899 Other long term (current) drug therapy: Secondary | ICD-10-CM | POA: Diagnosis not present

## 2024-04-04 DIAGNOSIS — W01198A Fall on same level from slipping, tripping and stumbling with subsequent striking against other object, initial encounter: Secondary | ICD-10-CM | POA: Insufficient documentation

## 2024-04-04 DIAGNOSIS — R519 Headache, unspecified: Secondary | ICD-10-CM | POA: Diagnosis present

## 2024-04-04 DIAGNOSIS — M542 Cervicalgia: Secondary | ICD-10-CM | POA: Insufficient documentation

## 2024-04-04 NOTE — ED Triage Notes (Signed)
 C/o headache and neck stiffness x 3 days. Reports doing a lot of activity on Sunday that might be cause of pain. Denies fevers.  Arrived wearing pain patch on neck.

## 2024-04-04 NOTE — Discharge Instructions (Addendum)
 Please follow up with your spine specialist as scheduled this week. They will be able to review your recent xray results and provide recommendations for treatment options. Also follow-up with your eye doctor as discussed.

## 2024-04-04 NOTE — ED Provider Notes (Signed)
 [place] EMERGENCY DEPARTMENT AT [facility] Provider Note   CSN: [id number] Arrival date & time: 04/04/24  1202     Patient presents with: Headache   Patty Howard is a 68 y.o. female.   Patient states she fell backwards on Saturday, striking her head on the frame of the sofa. She has had persistent headache since, along with neck discomfort and blurred vision in her left eye. No fevers/chills, extremity weakness, nausea, vomiting, difficulty speaking. She is not on blood thinners. Patient has attempted heat therapy, topical analgesic patches without any improvement.  The history is provided by the patient.  Headache Pain location:  Occipital Quality:  Dull Radiates to:  L neck Onset quality:  Sudden Duration:  3 days Timing:  Constant Progression:  Unchanged Chronicity:  New Ineffective treatments:  Prescription medications Associated symptoms: blurred vision, neck pain, neck stiffness and visual change   Associated symptoms: no nausea, no numbness, no paresthesias, no photophobia, no vomiting and no weakness        Prior to Admission medications   Medication Sig Start Date End Date Taking? Authorizing Provider  buprenorphine (BUTRANS) 10 MCG/HR PTWK Place 1 patch onto the skin once a week. 03/14/24  Yes [provider]  celecoxib (CELEBREX) 100 MG capsule Take 100 mg by mouth 2 (two) times daily. 03/14/24  Yes [provider]  pregabalin (LYRICA) 25 MG capsule Take 25 mg by mouth 2 (two) times daily. 03/31/24  Yes [provider]  acetaminophen  (TYLENOL ) 325 MG tablet Take 2 tablets (650 mg total) by mouth every 6 (six) hours as needed for mild pain (pain score 1-3) (or Fever >/= 101). 09/29/23   Will Almarie MATSU, MD  albuterol  (PROVENTIL ) (2.5 MG/3ML) 0.083% nebulizer solution Take 3 mLs (2.5 mg total) by nebulization every 6 (six) hours as needed for wheezing or shortness of breath. 03/17/24   Iva Marty Saltness, MD   albuterol  (VENTOLIN  HFA) 108 [phone] Base) MCG/ACT inhaler Inhale 1-2 puffs into the lungs every 6 (six) hours as needed for wheezing or shortness of breath. 03/31/24   Parrett, Madelin RAMAN, NP  amLODipine  (NORVASC ) 5 MG tablet Take 1 tablet (5 mg total) by mouth daily. 09/16/23   Emelia Josefa HERO, NP  ascorbic acid  (VITAMIN C) 500 MG tablet Take 500 mg by mouth daily.    [provider]  atorvastatin  (LIPITOR) 20 MG tablet Take 1 tablet (20 mg total) by mouth daily. 09/16/23   Emelia Josefa HERO, NP  azithromycin  (ZITHROMAX ) 500 MG tablet Take orally Monday, Wednesday and Friday. 01/31/24   Marinda Rocky SAILOR, MD  benzonatate  (TESSALON ) 200 MG capsule Take 1 capsule (200 mg total) by mouth 3 (three) times daily as needed. 03/31/24 03/31/25  Parrett, Madelin RAMAN, NP  BREZTRI  AEROSPHERE 160-9-4.8 MCG/ACT AERO inhaler Inhale 2 puffs into the lungs 2 (two) times daily. 01/31/24   Marinda Rocky SAILOR, MD  Diclofenac  35 MG CAPS Take 35 mg by mouth daily as needed. 09/29/23   Will Almarie MATSU, MD  diclofenac  Sodium (VOLTAREN) 1 % GEL Apply 4 g topically 4 (four) times daily. 09/23/23   [provider]  Diethylpropion  HCl CR 75 MG TB24 Take 1 tablet by mouth every morning. 08/13/23   [provider]  fluticasone  (FLONASE ) 50 MCG/ACT nasal spray Place 1 spray into both nostrils daily. 03/22/23   [provider]  furosemide  (LASIX ) 40 MG tablet Take 1 tablet (40 mg total) by mouth daily. 09/16/23   Emelia Josefa HERO, NP  gabapentin  (  NEURONTIN ) 300 MG capsule Take 1 capsule (300 mg total) by mouth 2 (two) times daily. 10/18/23 03/31/24  Georgina Ozell LABOR, MD  ipratropium-albuterol  (DUONEB) 0.5-2.5 (3) MG/3ML SOLN 1 vial (3 mg/ 3mL) via nebulizer every 6 hours as needed for cough, wheeze, shortness of breath, chest tightness. 03/28/24   Marinda Rocky SAILOR, MD  levothyroxine  (SYNTHROID ) 100 MCG tablet Take 100 mcg by mouth daily before breakfast.    [provider]  lidocaine  (LIDODERM ) 5 % Place 1 patch  onto the skin daily. Remove & Discard patch within 12 hours or as directed by MD 07/04/23   Schutt, Marsa HERO, PA-C  LINZESS  290 MCG CAPS capsule Take 290 mcg by mouth daily. 03/17/22   [provider]  losartan -hydrochlorothiazide  (HYZAAR) 100-25 MG tablet Take 1 tablet by mouth daily. 09/16/23   Emelia Josefa HERO, NP  methocarbamol  (ROBAXIN ) 500 MG tablet Take 1-2 tablets (500-1,000 mg total) by mouth every 8 (eight) hours as needed for muscle spasms. 12/30/23   Trine Raynell Moder, MD  methylPREDNISolone  (MEDROL  DOSEPAK) 4 MG TBPK tablet As directed Patient not taking: Reported on 03/31/2024 10/28/23   Rancour, Garnette, MD  omeprazole  (PRILOSEC) 40 MG capsule TAKE 1 CAPSULE (40 MG TOTAL) BY MOUTH DAILY. STOP PANTOPRAZOLE  03/13/24 04/24/24  Marinda Rocky SAILOR, MD  ondansetron  (ZOFRAN ) 4 MG tablet Take 1 tablet (4 mg total) by mouth every 6 (six) hours as needed for nausea. 09/29/23   Will Almarie MATSU, MD  oxyCODONE  (ROXICODONE ) 5 MG immediate release tablet Take 1 tablet (5 mg total) by mouth every 6 (six) hours as needed for severe pain (pain score 7-10) or breakthrough pain. 01/06/24   Barrett, Jamie N, PA-C  OZEMPIC , 1 MG/DOSE, 4 MG/3ML SOPN Inject 1 mg into the skin once a week.    [provider]  potassium chloride  SA (KLOR-CON  M) 20 MEQ tablet Take 1 tablet (20 mEq total) by mouth daily. 09/16/23 03/31/24  Emelia Josefa HERO, NP  predniSONE  (DELTASONE ) 20 MG tablet Take 2 tablets (40 mg total) by mouth daily. Patient not taking: Reported on 03/31/2024 01/06/24   Barrett, Warren SAILOR, PA-C  tezepelumab -ekko (TEZSPIRE ) 210 MG/1. syringe Inject 1.91 mLs (210 mg total) into the skin every 28 (twenty-eight) days. 07/12/23   Iva Marty Saltness, MD  Vitamin D , Ergocalciferol , (DRISDOL ) 1.25 MG (50000 UNIT) CAPS capsule Take 50,000 Units by mouth every Monday.  10/27/19   [provider]  zafirlukast  (ACCOLATE ) 20 MG tablet Take 1 tablet (20 mg total) by mouth 2 (two) times daily  before a meal. 01/31/24 07/29/24  Marinda Rocky SAILOR, MD    Allergies: Patient has no known allergies.    Review of Systems  Eyes:  Positive for blurred vision. Negative for photophobia.  Gastrointestinal:  Negative for nausea and vomiting.  Musculoskeletal:  Positive for neck pain and neck stiffness.  Neurological:  Positive for headaches. Negative for weakness, numbness and paresthesias.  All other systems reviewed and are negative.   Updated Vital Signs BP (!) 154/81 (BP Location: Left Arm)   Pulse 93   Temp 98.1 F (36.7 C)   Resp 18   SpO2 99%   Physical Exam Constitutional:      Appearance: She is well-developed.  HENT:     Head: Atraumatic.      Comments: Tenderness in occipital area without swelling or discoloration. TTP of midline cervical spine radiating to left upper shoulder. Cardiovascular:     Rate and Rhythm: Normal rate.  Pulmonary:  Effort: Pulmonary effort is normal.  Musculoskeletal:        General: Normal range of motion.     Cervical back: No rigidity.  Skin:    General: Skin is warm and dry.  Neurological:     Mental Status: She is alert and oriented to person, place, and time.     GCS: GCS eye subscore is 4. GCS verbal subscore is 5. GCS motor subscore is 6.     Sensory: No sensory deficit.     Motor: No weakness.  Psychiatric:        Mood and Affect: Mood normal.        Behavior: Behavior normal.     (all labs ordered are listed, but only abnormal results are displayed) Labs Reviewed - No data to display  EKG: None  Radiology: CT Cervical Spine Wo Contrast Result Date: 04/04/2024 EXAM: CT CERVICAL SPINE WITHOUT CONTRAST 04/04/2024 03:14:20 PM TECHNIQUE: CT of the cervical spine was performed without the administration of intravenous contrast. Multiplanar reformatted images are provided for review. Automated exposure control, iterative reconstruction, and/or weight based adjustment of the mA/kV was utilized to reduce the radiation dose to  as low as reasonably achievable. COMPARISON: Cervical spine radiographs 04/03/2021. CLINICAL HISTORY: Neck pain s/p recent fall, headache, and neck stiffness x 3 days. Reports doing a lot of activity on Sunday that might be cause of pain. FINDINGS: CERVICAL SPINE: BONES AND ALIGNMENT: Straightening of the normal cervical lordosis. Trace anterolisthesis of C3 on C4 and C4 on C5. No acute fracture or suspicious lesion. DEGENERATIVE CHANGES: Prominent atlantodental degenerative changes. Multilevel cervical disc degeneration, moderate at C5-C6 and C6-C7. Asymmetrically advanced left facet arthrosis from C2-C3 through C4-C5 resulting in severe left sided neural foraminal stenosis. Moderate to severe right neural foraminal stenosis at C5-C6 due to asymmetric right sided uncovertebral and facet spurring. At least mild spinal stenosis at C5-C6 and C6-C7. SOFT TISSUES: No prevertebral soft tissue swelling. LUNGS: Mild scarring in the left lung apex. IMPRESSION: 1. No acute fracture no traumatic malalignment. 2. 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 Electronically signed by: Dasie Hamburg MD 04/04/2024 03:39 PM EDT RP Workstation: [id number]   CT Head Wo Contrast Result Date: 04/04/2024 EXAM: CT HEAD WITHOUT CONTRAST 04/04/2024 03:14:20 PM TECHNIQUE: CT of the head was performed without the administration of intravenous contrast. Automated exposure control, iterative reconstruction, and/or weight based adjustment of the mA/kV was utilized to reduce the radiation dose to as low as reasonably achievable. COMPARISON: None available. CLINICAL HISTORY: Head trauma, persistent headache, neck pain, unilateral blurred vision. FINDINGS: BRAIN AND VENTRICLES: There is no evidence of an acute infarct, intracranial hemorrhage, mass, midline shift, hydrocephalus, or extra-axial fluid collection. Cerebral volume is normal. ORBITS: No acute abnormality. SINUSES: No  acute abnormality. SOFT TISSUES AND SKULL: No acute soft tissue abnormality. No skull fracture. IMPRESSION: 1. Negative head CT. Electronically signed by: Dasie Hamburg MD 04/04/2024 03:34 PM EDT RP Workstation: [id number]     Procedures   Medications Ordered in the ED - No data to display                                  Medical Decision Making Amount and/or Complexity of Data Reviewed Radiology: ordered.   Patient presents with headache and neck pain. No headache red flags. No evidence of meningismus, AMS, focal neurologic findings. CT of head without acute abnormality. 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 Patient is scheduled to see her spine and pain specialist this week. CT findings discussed with patient. Care instructions provided and return precautions discussed.     Final diagnoses:  Neck pain without injury    ED Discharge Orders     None          Claudene Lenis, NP 04/04/24 CATHLYN Ula Prentice JONELLE, MD 04/05/24 [phone]

## 2024-04-05 ENCOUNTER — Ambulatory Visit: Payer: Self-pay | Admitting: Adult Health

## 2024-04-06 ENCOUNTER — Other Ambulatory Visit: Payer: Self-pay | Admitting: *Deleted

## 2024-04-06 DIAGNOSIS — J4550 Severe persistent asthma, uncomplicated: Secondary | ICD-10-CM

## 2024-04-06 MED ORDER — IPRATROPIUM-ALBUTEROL 0.5-2.5 (3) MG/3ML IN SOLN
RESPIRATORY_TRACT | 1 refills | Status: DC
Start: 2024-04-06 — End: 2024-04-07

## 2024-04-07 ENCOUNTER — Other Ambulatory Visit: Payer: Self-pay

## 2024-04-07 DIAGNOSIS — J4550 Severe persistent asthma, uncomplicated: Secondary | ICD-10-CM

## 2024-04-07 MED ORDER — IPRATROPIUM-ALBUTEROL 0.5-2.5 (3) MG/3ML IN SOLN: RESPIRATORY_TRACT | 1 refills | Status: AC

## 2024-04-17 ENCOUNTER — Encounter: Payer: Self-pay | Admitting: Radiology

## 2024-04-19 ENCOUNTER — Ambulatory Visit

## 2024-04-19 ENCOUNTER — Other Ambulatory Visit: Payer: Self-pay | Admitting: *Deleted

## 2024-04-19 DIAGNOSIS — J4550 Severe persistent asthma, uncomplicated: Secondary | ICD-10-CM

## 2024-04-19 NOTE — Telephone Encounter (Signed)
 Received fax request from [employer] for a nondisdisposable nebulizer set and filter for 12 month length of need. Signed by Dr Marinda and faxed.

## 2024-04-28 ENCOUNTER — Other Ambulatory Visit: Payer: Self-pay

## 2024-04-28 ENCOUNTER — Emergency Department (HOSPITAL_BASED_OUTPATIENT_CLINIC_OR_DEPARTMENT_OTHER)

## 2024-04-28 ENCOUNTER — Emergency Department (HOSPITAL_BASED_OUTPATIENT_CLINIC_OR_DEPARTMENT_OTHER)
Admission: EM | Admit: 2024-04-28 | Discharge: 2024-04-29 | Disposition: A | Discharge status: Home / Self Care | Attending: Emergency Medicine | Admitting: Emergency Medicine

## 2024-04-28 DIAGNOSIS — J449 Chronic obstructive pulmonary disease, unspecified: Secondary | ICD-10-CM | POA: Diagnosis not present

## 2024-04-28 DIAGNOSIS — Z79899 Other long term (current) drug therapy: Secondary | ICD-10-CM | POA: Insufficient documentation

## 2024-04-28 DIAGNOSIS — I10 Essential (primary) hypertension: Secondary | ICD-10-CM | POA: Insufficient documentation

## 2024-04-28 DIAGNOSIS — R0789 Other chest pain: Secondary | ICD-10-CM | POA: Insufficient documentation

## 2024-04-28 DIAGNOSIS — Z7951 Long term (current) use of inhaled steroids: Secondary | ICD-10-CM | POA: Insufficient documentation

## 2024-04-28 DIAGNOSIS — R0602 Shortness of breath: Secondary | ICD-10-CM | POA: Insufficient documentation

## 2024-04-28 DIAGNOSIS — J3489 Other specified disorders of nose and nasal sinuses: Secondary | ICD-10-CM | POA: Insufficient documentation

## 2024-04-28 LAB — CBC
HCT: 36.5 % (ref 36.0–46.0)
Hemoglobin: 11.4 g/dL — ABNORMAL LOW (ref 12.0–15.0)
MCH: 26.3 pg (ref 26.0–34.0)
MCHC: 31.2 g/dL (ref 30.0–36.0)
MCV: 84.1 fL (ref 80.0–100.0)
Platelets: 197 K/uL (ref 150–400)
RBC: 4.34 MIL/uL (ref 3.87–5.11)
RDW: 14.9 % (ref 11.5–15.5)
WBC: 6.8 K/uL (ref 4.0–10.5)
nRBC: 0 % (ref 0.0–0.2)

## 2024-04-28 LAB — BASIC METABOLIC PANEL WITH GFR
Anion gap: 11 (ref 5–15)
BUN: 15 mg/dL (ref 8–23)
CO2: 25 mmol/L (ref 22–32)
Calcium: 9.9 mg/dL (ref 8.9–10.3)
Chloride: 108 mmol/L (ref 98–111)
Creatinine, Ser: 0.71 mg/dL (ref 0.44–1.00)
GFR, Estimated: >60 mL/min (ref >60)
Glucose, Bld: 94 mg/dL (ref 70–99)
Potassium: 4.2 mmol/L (ref 3.5–5.1)
Sodium: 143 mmol/L (ref 135–145)

## 2024-04-28 LAB — TROPONIN T, HIGH SENSITIVITY: Troponin T High Sensitivity: <15 ng/L (ref 0–19)

## 2024-04-28 NOTE — ED Triage Notes (Signed)
 Pt POV reporting SOB and mid chest pain after taking shower tonight, hx asthma, used inhaler without improvement.

## 2024-04-28 NOTE — ED Notes (Signed)
 Seen in triage by this RT, dim BLB rhonchi and a sm wheeze when she exhales.

## 2024-04-29 ENCOUNTER — Emergency Department (HOSPITAL_BASED_OUTPATIENT_CLINIC_OR_DEPARTMENT_OTHER): Admitting: Radiology

## 2024-04-29 LAB — TROPONIN T, HIGH SENSITIVITY: Troponin T High Sensitivity: <15 ng/L (ref 0–19)

## 2024-04-29 NOTE — ED Provider Notes (Signed)
 [place] EMERGENCY DEPARTMENT AT [facility] Provider Note   CSN: [id number] Arrival date & time: 04/28/24  2224     Patient presents with: Chest Pain and Shortness of Breath   Heidi Maclin is a 68 y.o. female.   HPI     This is a 68 year old female who presents with chest discomfort.  Patient reports that she was taking a shower and got out of the shower and noted some tightness over her chest.  She has a history of asthma and used her inhaler without improvement.  She states that she wanted to make sure it was not her heart.  She does follow with cardiology but no known history of heart disease.  Has not had any recent cough or fevers.  States the pain is in her mid chest and nonradiating.  Nonexertional.  Nothing seems to make it better or worse.  Prior to Admission medications   Medication Sig Start Date End Date Taking? Authorizing Provider  acetaminophen  (TYLENOL ) 325 MG tablet Take 2 tablets (650 mg total) by mouth every 6 (six) hours as needed for mild pain (pain score 1-3) (or Fever >/= 101). 09/29/23   Will Almarie MATSU, MD  albuterol  (PROVENTIL ) (2.5 MG/3ML) 0.083% nebulizer solution Take 3 mLs (2.5 mg total) by nebulization every 6 (six) hours as needed for wheezing or shortness of breath. 03/17/24   Iva Marty Saltness, MD  albuterol  (VENTOLIN  HFA) 108 [phone] Base) MCG/ACT inhaler Inhale 1-2 puffs into the lungs every 6 (six) hours as needed for wheezing or shortness of breath. 03/31/24   Parrett, Madelin RAMAN, NP  amLODipine  (NORVASC ) 5 MG tablet Take 1 tablet (5 mg total) by mouth daily. 09/16/23   Emelia Josefa HERO, NP  ascorbic acid  (VITAMIN C) 500 MG tablet Take 500 mg by mouth daily.    [provider]  atorvastatin  (LIPITOR) 20 MG tablet Take 1 tablet (20 mg total) by mouth daily. 09/16/23   Emelia Josefa HERO, NP  azithromycin  (ZITHROMAX ) 500 MG tablet Take orally Monday, Wednesday and Friday. 01/31/24   Marinda Rocky SAILOR, MD  benzonatate  (TESSALON ) 200  MG capsule Take 1 capsule (200 mg total) by mouth 3 (three) times daily as needed. 03/31/24 03/31/25  Parrett, Madelin RAMAN, NP  BREZTRI  AEROSPHERE 160-9-4.8 MCG/ACT AERO inhaler Inhale 2 puffs into the lungs 2 (two) times daily. 01/31/24   Marinda Rocky SAILOR, MD  buprenorphine (BUTRANS) 10 MCG/HR PTWK Place 1 patch onto the skin once a week. 03/14/24   [provider]  celecoxib (CELEBREX) 100 MG capsule Take 100 mg by mouth 2 (two) times daily. 03/14/24   [provider]  Diclofenac  35 MG CAPS Take 35 mg by mouth daily as needed. 09/29/23   Will Almarie MATSU, MD  diclofenac  Sodium (VOLTAREN) 1 % GEL Apply 4 g topically 4 (four) times daily. 09/23/23   [provider]  Diethylpropion  HCl CR 75 MG TB24 Take 1 tablet by mouth every morning. 08/13/23   [provider]  fluticasone  (FLONASE ) 50 MCG/ACT nasal spray Place 1 spray into both nostrils daily. 03/22/23   [provider]  furosemide  (LASIX ) 40 MG tablet Take 1 tablet (40 mg total) by mouth daily. 09/16/23   Emelia Josefa HERO, NP  gabapentin  (NEURONTIN ) 300 MG capsule Take 1 capsule (300 mg total) by mouth 2 (two) times daily. 10/18/23 03/31/24  Georgina Ozell LABOR, MD  ipratropium-albuterol  (DUONEB) 0.5-2.5 (3) MG/3ML SOLN 1 vial (3 mg/ 3mL) via nebulizer every 6 hours as needed for cough,  wheeze, shortness of breath, chest tightness. 04/07/24   Marinda Rocky SAILOR, MD  levothyroxine  (SYNTHROID ) 100 MCG tablet Take 100 mcg by mouth daily before breakfast.    [provider]  lidocaine  (LIDODERM ) 5 % Place 1 patch onto the skin daily. Remove & Discard patch within 12 hours or as directed by MD 07/04/23   Schutt, Marsa HERO, PA-C  LINZESS  290 MCG CAPS capsule Take 290 mcg by mouth daily. 03/17/22   [provider]  losartan -hydrochlorothiazide  (HYZAAR) 100-25 MG tablet Take 1 tablet by mouth daily. 09/16/23   Emelia Josefa HERO, NP  methocarbamol  (ROBAXIN ) 500 MG tablet Take 1-2 tablets (500-1,000 mg total) by mouth  every 8 (eight) hours as needed for muscle spasms. 12/30/23   Trine Raynell Moder, MD  methylPREDNISolone  (MEDROL  DOSEPAK) 4 MG TBPK tablet As directed Patient not taking: Reported on 03/31/2024 10/28/23   Rancour, Garnette, MD  omeprazole  (PRILOSEC) 40 MG capsule TAKE 1 CAPSULE (40 MG TOTAL) BY MOUTH DAILY. STOP PANTOPRAZOLE  03/13/24 04/24/24  Marinda Rocky SAILOR, MD  ondansetron  (ZOFRAN ) 4 MG tablet Take 1 tablet (4 mg total) by mouth every 6 (six) hours as needed for nausea. 09/29/23   Will Almarie MATSU, MD  oxyCODONE  (ROXICODONE ) 5 MG immediate release tablet Take 1 tablet (5 mg total) by mouth every 6 (six) hours as needed for severe pain (pain score 7-10) or breakthrough pain. 01/06/24   Barrett, Warren SAILOR, PA-C  OZEMPIC , 1 MG/DOSE, 4 MG/3ML SOPN Inject 1 mg into the skin once a week.    [provider]  potassium chloride  SA (KLOR-CON  M) 20 MEQ tablet Take 1 tablet (20 mEq total) by mouth daily. 09/16/23 03/31/24  Emelia Josefa HERO, NP  predniSONE  (DELTASONE ) 20 MG tablet Take 2 tablets (40 mg total) by mouth daily. Patient not taking: Reported on 03/31/2024 01/06/24   Barrett, Jamie N, PA-C  pregabalin (LYRICA) 25 MG capsule Take 25 mg by mouth 2 (two) times daily. 03/31/24   [provider]  tezepelumab -ekko (TEZSPIRE ) 210 MG/1. syringe Inject 1.91 mLs (210 mg total) into the skin every 28 (twenty-eight) days. 07/12/23   Iva Marty Saltness, MD  Vitamin D , Ergocalciferol , (DRISDOL ) 1.25 MG (50000 UNIT) CAPS capsule Take 50,000 Units by mouth every Monday.  10/27/19   [provider]  zafirlukast  (ACCOLATE ) 20 MG tablet Take 1 tablet (20 mg total) by mouth 2 (two) times daily before a meal. 01/31/24 07/29/24  Marinda Rocky SAILOR, MD    Allergies: Patient has no known allergies.    Review of Systems  Constitutional:  Negative for fever.  Respiratory:  Positive for shortness of breath.   Cardiovascular:  Positive for chest pain.  Gastrointestinal:  Negative for abdominal  pain.  All other systems reviewed and are negative.   Updated Vital Signs BP 124/84   Pulse 87   Temp 97.8 F (36.6 C)   Resp 18   Ht 1.753 m (5' 9)   Wt 122.5 kg   SpO2 99%   BMI 39.87 kg/m   Physical Exam Vitals and nursing note reviewed.  Constitutional:      Appearance: She is well-developed. She is obese. She is not ill-appearing.  HENT:     Head: Normocephalic and atraumatic.  Eyes:     Pupils: Pupils are equal, round, and reactive to light.  Cardiovascular:     Rate and Rhythm: Normal rate and regular rhythm.     Heart sounds: Normal heart sounds.  Pulmonary:     Effort: Pulmonary effort  is normal. No respiratory distress.     Breath sounds: Rhonchi present. No wheezing.     Comments: Occasional rhonchorous breath sounds with fair air movement Abdominal:     General: Bowel sounds are normal.     Palpations: Abdomen is soft.  Musculoskeletal:     Cervical back: Neck supple.  Skin:    General: Skin is warm and dry.  Neurological:     Mental Status: She is alert and oriented to person, place, and time.  Psychiatric:        Mood and Affect: Mood normal.     (all labs ordered are listed, but only abnormal results are displayed) Labs Reviewed  CBC - Abnormal; Notable for the following components:      Result Value   Hemoglobin 11.4 (*)    All other components within normal limits  BASIC METABOLIC PANEL WITH GFR  TROPONIN T, HIGH SENSITIVITY  TROPONIN T, HIGH SENSITIVITY    EKG: EKG Interpretation Date/Time:  Friday April 28 2024 22:27:23 EST Ventricular Rate:  83 PR Interval:  143 QRS Duration:  94 QT Interval:  403 QTC Calculation: 474 R Axis:   76  Text Interpretation: Sinus rhythm Confirmed by Bari Pfeiffer ([id number]) on 04/28/2024 11:24:24 PM  Radiology: [facility] Chest 2 View Result Date: 04/29/2024 EXAM: 2 VIEW(S) XRAY OF THE CHEST 04/29/2024 12:21:57 AM COMPARISON: 04/28/2024 CLINICAL HISTORY: cp cp FINDINGS: LUNGS AND PLEURA: No focal  pulmonary opacity. No pleural effusion. No pneumothorax. HEART AND MEDIASTINUM: No acute abnormality of the cardiac and mediastinal silhouettes. BONES AND SOFT TISSUES: No acute osseous abnormality. IMPRESSION: 1. No acute cardiopulmonary process. Electronically signed by: Pinkie Pebbles MD 04/29/2024 12:24 AM EST RP Workstation: [id number]   DG Chest Port 1 View Result Date: 04/28/2024 EXAM: 1 VIEW(S) XRAY OF THE CHEST 04/28/2024 11:10:00 PM COMPARISON: 03/31/2024 CLINICAL HISTORY: Shortness of breath FINDINGS: LUNGS AND PLEURA: No focal pulmonary opacity. No pleural effusion. No pneumothorax. HEART AND MEDIASTINUM: Opacity overlies the cardiac silhouette within the midline, possible developing a hiatal or Morgagni hernia. This will be better assessed with dedicated standard 2-view chest radiograph. BONES AND SOFT TISSUES: No acute osseous abnormality. IMPRESSION: 1. Possible developing hiatal or Morgagni hernia, this would be better assessed with standard 2-view chest radiograph. Electronically signed by: Dorethia Molt MD 04/28/2024 11:18 PM EST RP Workstation: [id number]     Procedures   Medications Ordered in the ED - No data to display                                  Medical Decision Making Amount and/or Complexity of Data Reviewed Labs: ordered. Radiology: ordered.   This patient presents to the ED for concern of chest pain, this involves an extensive number of treatment options, and is a complaint that carries with it a high risk of complications and morbidity.  I considered the following differential and admission for this acute, potentially life threatening condition.  The differential diagnosis includes ACS, PE, pneumothorax, pneumonia, asthma, bronchitis  MDM:    This is a 68 year old female who presents with chest pain.  She is nontoxic and vital signs are reassuring.  O2 sats are normal.  She is afebrile.  She is in no respiratory distress.  She has an occasional rhonchorous  breath sound but no significant wheezing.  EKG shows no evidence of acute ischemia or arrhythmia.  Chest x-ray shows no evidence of pneumothorax or pneumonia.  Labs  are largely unremarkable including troponin x 2.  Doubt PE as patient is not tachycardic or hypoxic.  Overall reassuring workup.  Will have patient call cardiology on Monday for closer follow-up.  (Labs, imaging, consults)  Labs: I Ordered, and personally interpreted labs.  The pertinent results include: CBC, BMP, troponin x 2  Imaging Studies ordered: I ordered imaging studies including chest x-ray I independently visualized and interpreted imaging. I agree with the radiologist interpretation  Additional history obtained from chart review.  External records from outside source obtained and reviewed including prior evaluations  Cardiac Monitoring: The patient was maintained on a cardiac monitor.  If on the cardiac monitor, I personally viewed and interpreted the cardiac monitored which showed an underlying rhythm of: Sinus  Reevaluation: After the interventions noted above, I reevaluated the patient and found that they have :improved  Social Determinants of Health:  lives independently  Disposition: Discharge  Co morbidities that complicate the patient evaluation  Past Medical History:  Diagnosis Date   Anxiety    Arthritis    Asthma    COPD (chronic obstructive pulmonary disease) (HCC)    Fracture    closed displaced left radial head   GERD (gastroesophageal reflux disease)    Hypertension    Sleep apnea    does not wear CPAP     Medicines No orders of the defined types were placed in this encounter.   I have reviewed the patients home medicines and have made adjustments as needed  Problem List / ED Course: Problem List Items Addressed This Visit   None Visit Diagnoses       Atypical chest pain    -  Primary                Final diagnoses:  Atypical chest pain    ED Discharge Orders      None          Bari Charmaine FALCON, MD 04/29/24 [phone]

## 2024-04-29 NOTE — Discharge Instructions (Signed)
 You were seen today for chest pain.  Your workup today is reassuring.  Follow-up closely with your cardiologist.  If you have any new or worsening symptoms including recurrent pain, shortness of breath, sweating, you should be reevaluated.

## 2024-05-03 ENCOUNTER — Telehealth: Payer: Self-pay | Admitting: Orthopaedic Surgery

## 2024-05-03 NOTE — Telephone Encounter (Signed)
 Received vm from patient requesting to get MRI lumbar spine. IC,na, mailbox full, unable to leave message.  Patient will need to obtain MRI images from the facility where it was done at.  Pt ph [phone]

## 2024-05-05 ENCOUNTER — Telehealth: Payer: Self-pay | Admitting: Orthopaedic Surgery

## 2024-05-05 NOTE — Telephone Encounter (Signed)
 I received patient auth to get MRI Lumbar spine from 09/06/23. I tried calling patient to advise she will need to obtain MRI from facility where it was done. NA, mailbox full, unable to leave a message. I mailed pt note with her request to contact [facility] for the MRI.

## 2024-05-12 ENCOUNTER — Other Ambulatory Visit: Payer: Self-pay | Admitting: Internal Medicine

## 2024-05-17 ENCOUNTER — Ambulatory Visit

## 2024-05-17 DIAGNOSIS — J4550 Severe persistent asthma, uncomplicated: Secondary | ICD-10-CM

## 2024-05-24 ENCOUNTER — Encounter: Payer: Self-pay | Admitting: Family

## 2024-05-24 DIAGNOSIS — Z1231 Encounter for screening mammogram for malignant neoplasm of breast: Secondary | ICD-10-CM

## 2024-06-14 ENCOUNTER — Ambulatory Visit

## 2024-06-14 DIAGNOSIS — J4550 Severe persistent asthma, uncomplicated: Secondary | ICD-10-CM

## 2024-06-26 ENCOUNTER — Other Ambulatory Visit: Payer: Self-pay

## 2024-06-26 DIAGNOSIS — M1990 Unspecified osteoarthritis, unspecified site: Secondary | ICD-10-CM | POA: Insufficient documentation

## 2024-06-26 DIAGNOSIS — T148XXA Other injury of unspecified body region, initial encounter: Secondary | ICD-10-CM | POA: Insufficient documentation

## 2024-06-26 DIAGNOSIS — F419 Anxiety disorder, unspecified: Secondary | ICD-10-CM | POA: Insufficient documentation

## 2024-06-26 DIAGNOSIS — G4730 Sleep apnea, unspecified: Secondary | ICD-10-CM | POA: Insufficient documentation

## 2024-07-03 ENCOUNTER — Other Ambulatory Visit: Payer: Self-pay | Admitting: Internal Medicine

## 2024-07-06 DIAGNOSIS — I10 Essential (primary) hypertension: Secondary | ICD-10-CM | POA: Insufficient documentation

## 2024-07-06 NOTE — Progress Notes (Unsigned)
 " Cardiology Office Note:   Date:  07/06/2024  ID:  Patty Howard, DOB September 27, 1955, MRN [id number] PCP: Rik Glinda DASEN, FNP  [facility] Providers Cardiologist:  Lynwood Schilling, MD Cardiology APP:  Madie Jon Garre, PA {  History of Present Illness:   Patty Howard is a 69 y.o. female  is a PMH of OSA on CPAP, COPD, asthma, and  HFpEF.  She used to be a emergency planning/management officer in [place].  She establish care with me on 12/01/2019.  She was being seen for shortness of breath and chest discomfort.  She had been hospitalized for 2 days prior to her clinic visit with chest discomfort and shortness of breath.  She was treated for HFpEF.  Her echocardiogram 11/2019 showed an LVEF of 65-70%, G1 DD, no regional wall motion abnormalities and no significant valvular disease.  She had nuclear stress testing which was negative for ischemia.  She was noted to have aortic atherosclerosis on CTA 2021.  She was also noted to have pulmonary nodules that were identified on CT which are followed by pulmonology.  Echo 2023 demonstrated NL EF with moderate concentric hypertrophy.  She was in the ED in Nov 2025 with chest pain.  I reviewed these records for this visit.  .  There was no objective evidence of ischemia.  ***    ***    She was hospitalized 10/06/23 with COPD exacerbation secondary to influenza A infection.  She was treated with Tamiflu  and doxycycline .  She was discharged on prednisone  taper.  She presented for follow-up on 10/21/2021 with Angie Duke PA-C.  She was out of her medications at that time.  Her echocardiogram was repeated and showed normal LVEF, moderate concentric LVH, G1 DD, mild dilation of her left atria and no significant valvular abnormalities.  Her medications were refilled and follow-up was planned for around 6 months.  She was seen by Josefa Beauvais NP in Jan.     She presents to the clinic today for follow-up evaluation.  She has been doing relatively well.  The patient denies  any new symptoms such as chest discomfort, neck or arm discomfort. There has been no new shortness of breath, PND or orthopnea. There have been no reported palpitations, presyncope or syncope.   ROS: ***  Studies Reviewed:    EKG:       ***  Risk Assessment/Calculations:   {Does this patient have ATRIAL FIBRILLATION?[phone]} No BP recorded.  {Refresh Note OR Click here to enter BP  :1}***        Physical Exam:   VS:  There were no vitals taken for this visit.   Wt Readings from Last 3 Encounters:  04/28/24 270 lb (122.5 kg)  03/31/24 272 lb (123.4 kg)  12/30/23 285 lb (129.3 kg)     GEN: Well nourished, well developed in no acute distress NECK: No JVD; No carotid bruits CARDIAC: ***RR, *** murmurs, rubs, gallops RESPIRATORY:  Clear to auscultation without rales, wheezing or rhonchi  ABDOMEN: Soft, non-tender, non-distended EXTREMITIES:  No edema; No deformity   ASSESSMENT AND PLAN:   HFpEF: *** The patient is euvolemic.  No change in therapy.  She will continue with the meds as listed.   DOE:  ***  She does have a little bit of a cough and wheezing today.  She is getting take her albuterol  twice and see her primary provider.   Hyperlipidemia:   ***   Do not see her most recent cholesterol.  I will  ask her primary provider to send this.  For now she will continue the atorvastatin .   Essential hypertension: Her blood pressure is ***   well-controlled.  No change in therapy.   Type 2 diabetes:  ****   Last A1c that I see is 6.0.  We will check for these results from this year.  She thinks it is around that same level.       Follow up ***  Signed, Lynwood Schilling, MD   "

## 2024-07-07 ENCOUNTER — Ambulatory Visit: Admitting: Cardiology

## 2024-07-07 DIAGNOSIS — E785 Hyperlipidemia, unspecified: Secondary | ICD-10-CM

## 2024-07-07 DIAGNOSIS — I10 Essential (primary) hypertension: Secondary | ICD-10-CM

## 2024-07-07 DIAGNOSIS — E118 Type 2 diabetes mellitus with unspecified complications: Secondary | ICD-10-CM

## 2024-07-07 DIAGNOSIS — I5032 Chronic diastolic (congestive) heart failure: Secondary | ICD-10-CM

## 2024-07-07 DIAGNOSIS — R0609 Other forms of dyspnea: Secondary | ICD-10-CM

## 2024-07-13 ENCOUNTER — Ambulatory Visit

## 2024-07-13 ENCOUNTER — Telehealth: Payer: Self-pay | Admitting: Cardiology

## 2024-07-13 NOTE — Telephone Encounter (Signed)
 Anginent from [facility] is requesting patient cardiology report and ECHO to be faxed to them.   Fax  [phone]

## 2024-07-20 ENCOUNTER — Ambulatory Visit

## 2024-07-21 ENCOUNTER — Other Ambulatory Visit: Payer: Self-pay | Admitting: *Deleted

## 2024-07-21 ENCOUNTER — Ambulatory Visit: Admission: RE | Admit: 2024-07-21 | Source: Ambulatory Visit

## 2024-07-21 ENCOUNTER — Other Ambulatory Visit: Payer: Self-pay | Admitting: Adult Health

## 2024-07-21 ENCOUNTER — Other Ambulatory Visit (HOSPITAL_COMMUNITY): Payer: Self-pay

## 2024-07-21 DIAGNOSIS — J441 Chronic obstructive pulmonary disease with (acute) exacerbation: Secondary | ICD-10-CM

## 2024-07-21 MED ORDER — TEZSPIRE 210 MG/1.91ML ~~LOC~~ SOSY: 210.0000 mg | PREFILLED_SYRINGE | SUBCUTANEOUS | 11 refills | Status: AC

## 2024-08-01 ENCOUNTER — Ambulatory Visit: Admitting: Allergy & Immunology

## 2024-08-04 ENCOUNTER — Ambulatory Visit: Admitting: Cardiology
